# Patient Record
Sex: Male | Born: 1980 | Race: Black or African American | Hispanic: No | Marital: Married | Smoking: Current every day smoker
Health system: Southern US, Community
[De-identification: ages and names within clinical notes are randomized; demographics above are authoritative.]

## PROBLEM LIST (undated history)

## (undated) DIAGNOSIS — J45909 Unspecified asthma, uncomplicated: Secondary | ICD-10-CM

## (undated) HISTORY — PX: FRACTURE SURGERY: SHX138

---

## 2002-02-27 ENCOUNTER — Encounter: Payer: Self-pay | Admitting: Internal Medicine

## 2002-02-27 ENCOUNTER — Emergency Department (HOSPITAL_COMMUNITY): Admission: EM | Admit: 2002-02-27 | Discharge: 2002-02-27 | Payer: Self-pay | Admitting: Internal Medicine

## 2002-12-05 ENCOUNTER — Emergency Department (HOSPITAL_COMMUNITY): Admission: EM | Admit: 2002-12-05 | Discharge: 2002-12-05 | Payer: Self-pay | Admitting: *Deleted

## 2005-03-26 ENCOUNTER — Emergency Department (HOSPITAL_COMMUNITY): Admission: EM | Admit: 2005-03-26 | Discharge: 2005-03-26 | Payer: Self-pay | Admitting: Emergency Medicine

## 2005-10-09 ENCOUNTER — Emergency Department (HOSPITAL_COMMUNITY): Admission: EM | Admit: 2005-10-09 | Discharge: 2005-10-09 | Payer: Self-pay | Admitting: Emergency Medicine

## 2005-10-18 ENCOUNTER — Emergency Department (HOSPITAL_COMMUNITY): Admission: EM | Admit: 2005-10-18 | Discharge: 2005-10-18 | Payer: Self-pay | Admitting: Emergency Medicine

## 2006-01-29 ENCOUNTER — Emergency Department (HOSPITAL_COMMUNITY): Admission: EM | Admit: 2006-01-29 | Discharge: 2006-01-29 | Payer: Self-pay | Admitting: Emergency Medicine

## 2007-03-07 ENCOUNTER — Emergency Department (HOSPITAL_COMMUNITY): Admission: EM | Admit: 2007-03-07 | Discharge: 2007-03-07 | Payer: Self-pay | Admitting: Emergency Medicine

## 2008-02-11 ENCOUNTER — Emergency Department (HOSPITAL_COMMUNITY): Admission: EM | Admit: 2008-02-11 | Discharge: 2008-02-11 | Payer: Self-pay | Admitting: Emergency Medicine

## 2008-02-17 ENCOUNTER — Emergency Department (HOSPITAL_COMMUNITY): Admission: EM | Admit: 2008-02-17 | Discharge: 2008-02-17 | Payer: Self-pay | Admitting: Emergency Medicine

## 2008-03-12 ENCOUNTER — Emergency Department (HOSPITAL_COMMUNITY): Admission: EM | Admit: 2008-03-12 | Discharge: 2008-03-12 | Payer: Self-pay | Admitting: Emergency Medicine

## 2008-06-03 ENCOUNTER — Emergency Department (HOSPITAL_COMMUNITY): Admission: EM | Admit: 2008-06-03 | Discharge: 2008-06-03 | Payer: Self-pay | Admitting: Emergency Medicine

## 2008-12-06 ENCOUNTER — Emergency Department (HOSPITAL_COMMUNITY): Admission: EM | Admit: 2008-12-06 | Discharge: 2008-12-06 | Payer: Self-pay | Admitting: Emergency Medicine

## 2009-07-13 ENCOUNTER — Emergency Department (HOSPITAL_COMMUNITY): Admission: EM | Admit: 2009-07-13 | Discharge: 2009-07-13 | Payer: Self-pay | Admitting: Emergency Medicine

## 2009-07-19 ENCOUNTER — Emergency Department (HOSPITAL_COMMUNITY): Admission: EM | Admit: 2009-07-19 | Discharge: 2009-07-19 | Payer: Self-pay | Admitting: Emergency Medicine

## 2009-09-30 ENCOUNTER — Emergency Department (HOSPITAL_COMMUNITY): Admission: EM | Admit: 2009-09-30 | Discharge: 2009-09-30 | Payer: Self-pay | Admitting: Emergency Medicine

## 2009-10-01 ENCOUNTER — Emergency Department (HOSPITAL_COMMUNITY): Admission: EM | Admit: 2009-10-01 | Discharge: 2009-10-01 | Payer: Self-pay | Admitting: Emergency Medicine

## 2009-10-07 ENCOUNTER — Emergency Department (HOSPITAL_COMMUNITY): Admission: EM | Admit: 2009-10-07 | Discharge: 2009-10-07 | Payer: Self-pay | Admitting: Emergency Medicine

## 2010-04-18 ENCOUNTER — Emergency Department (HOSPITAL_COMMUNITY): Admission: EM | Admit: 2010-04-18 | Discharge: 2010-04-18 | Payer: Self-pay | Admitting: Emergency Medicine

## 2011-01-27 ENCOUNTER — Emergency Department (HOSPITAL_COMMUNITY)
Admission: EM | Admit: 2011-01-27 | Discharge: 2011-01-28 | Disposition: A | Discharge status: Home / Self Care | Payer: Self-pay | Attending: Emergency Medicine | Admitting: Emergency Medicine

## 2011-01-27 DIAGNOSIS — J45909 Unspecified asthma, uncomplicated: Secondary | ICD-10-CM | POA: Insufficient documentation

## 2011-01-27 DIAGNOSIS — R000 Tachycardia, unspecified: Secondary | ICD-10-CM | POA: Insufficient documentation

## 2011-01-27 DIAGNOSIS — R112 Nausea with vomiting, unspecified: Secondary | ICD-10-CM | POA: Insufficient documentation

## 2011-01-27 DIAGNOSIS — R197 Diarrhea, unspecified: Secondary | ICD-10-CM | POA: Insufficient documentation

## 2011-01-27 DIAGNOSIS — E860 Dehydration: Secondary | ICD-10-CM | POA: Insufficient documentation

## 2011-01-27 LAB — BASIC METABOLIC PANEL
BUN: 8 mg/dL (ref 6–23)
CO2: 25 mEq/L (ref 19–32)
Calcium: 9.4 mg/dL (ref 8.4–10.5)
Chloride: 103 mEq/L (ref 96–112)
Creatinine, Ser: 1.09 mg/dL (ref 0.4–1.5)
GFR calc Af Amer: >60 mL/min (ref >60)
GFR calc non Af Amer: >60 mL/min (ref >60)
Glucose, Bld: 130 mg/dL — ABNORMAL HIGH (ref 70–99)
Potassium: 3.4 mEq/L — ABNORMAL LOW (ref 3.5–5.1)
Sodium: 138 mEq/L (ref 135–145)

## 2011-04-01 LAB — ROCKY MTN SPOTTED FVR AB, IGG-BLOOD: RMSF IgG: 0.07 IV

## 2011-04-01 LAB — ROCKY MTN SPOTTED FVR AB, IGM-BLOOD: RMSF IgM: 0.13 IV (ref 0.00–0.89)

## 2011-05-09 ENCOUNTER — Emergency Department (HOSPITAL_COMMUNITY)
Admission: EM | Admit: 2011-05-09 | Discharge: 2011-05-09 | Disposition: A | Discharge status: Home / Self Care | Payer: Self-pay | Attending: Emergency Medicine | Admitting: Emergency Medicine

## 2011-05-09 DIAGNOSIS — L03211 Cellulitis of face: Secondary | ICD-10-CM | POA: Insufficient documentation

## 2011-05-09 DIAGNOSIS — L0201 Cutaneous abscess of face: Secondary | ICD-10-CM | POA: Insufficient documentation

## 2011-05-14 LAB — CULTURE, ROUTINE-ABSCESS: Gram Stain: NONE SEEN

## 2013-06-09 ENCOUNTER — Emergency Department (HOSPITAL_COMMUNITY)
Admission: EM | Admit: 2013-06-09 | Discharge: 2013-06-09 | Disposition: A | Discharge status: Home / Self Care | Payer: Medicaid Other | Attending: Emergency Medicine | Admitting: Emergency Medicine

## 2013-06-09 ENCOUNTER — Encounter (HOSPITAL_COMMUNITY): Payer: Self-pay | Admitting: Emergency Medicine

## 2013-06-09 DIAGNOSIS — Y929 Unspecified place or not applicable: Secondary | ICD-10-CM | POA: Insufficient documentation

## 2013-06-09 DIAGNOSIS — IMO0001 Reserved for inherently not codable concepts without codable children: Secondary | ICD-10-CM | POA: Insufficient documentation

## 2013-06-09 DIAGNOSIS — S50862A Insect bite (nonvenomous) of left forearm, initial encounter: Secondary | ICD-10-CM

## 2013-06-09 DIAGNOSIS — Y939 Activity, unspecified: Secondary | ICD-10-CM | POA: Insufficient documentation

## 2013-06-09 DIAGNOSIS — F17200 Nicotine dependence, unspecified, uncomplicated: Secondary | ICD-10-CM | POA: Insufficient documentation

## 2013-06-09 MED ORDER — DOXYCYCLINE HYCLATE 100 MG PO CAPS: 100.0000 mg | ORAL_CAPSULE | Freq: Two times a day (BID) | ORAL | Status: AC

## 2013-06-09 MED ORDER — DOXYCYCLINE HYCLATE 100 MG PO TABS
100.0000 mg | ORAL_TABLET | Freq: Once | ORAL | Status: AC
  Administered 2013-06-09: 100 mg via ORAL
  Filled 2013-06-09: qty 1

## 2013-06-09 MED ORDER — DEXAMETHASONE SODIUM PHOSPHATE 4 MG/ML IJ SOLN
8.0000 mg | Freq: Once | INTRAMUSCULAR | Status: AC
  Administered 2013-06-09: 8 mg via INTRAMUSCULAR
  Filled 2013-06-09: qty 2

## 2013-06-09 NOTE — ED Notes (Signed)
Pt c/o abscess to left arm since yesterday.

## 2013-06-09 NOTE — ED Provider Notes (Signed)
History     CSN: [id number]  Arrival date & time 06/09/13  7829   First MD Initiated Contact with Patient 06/09/13 [phone]      Chief Complaint  Patient presents with  . Abscess    (Consider location/radiation/quality/duration/timing/severity/associated sxs/prior treatment) Patient is a 32 y.o. male presenting with abscess. The history is provided by the patient.  Abscess Location:  Shoulder/arm Shoulder/arm abscess location:  L forearm Abscess quality: itching and redness   Abscess quality: not draining   Red streaking: no   Duration:  1 day Progression:  Worsening Chronicity:  New Context: not diabetes and not immunosuppression   Relieved by:  Nothing Worsened by:  Nothing tried Ineffective treatments:  None tried Associated symptoms: no fatigue and no fever     History reviewed. No pertinent past medical history.  History reviewed. No pertinent past surgical history.  No family history on file.  History  Substance Use Topics  . Smoking status: Current Every Day Smoker -- 1.00 packs/day  . Smokeless tobacco: Not on file  . Alcohol Use: No      Review of Systems  Constitutional: Negative for fever, activity change and fatigue.       All ROS Neg except as noted in HPI  HENT: Negative for nosebleeds and neck pain.   Eyes: Negative for photophobia and discharge.  Respiratory: Negative for cough, shortness of breath and wheezing.   Cardiovascular: Negative for chest pain and palpitations.  Gastrointestinal: Negative for abdominal pain and blood in stool.  Genitourinary: Negative for dysuria, frequency and hematuria.  Musculoskeletal: Negative for back pain and arthralgias.  Skin: Negative.   Neurological: Negative for dizziness, seizures and speech difficulty.  Psychiatric/Behavioral: Negative for hallucinations and confusion.    Allergies  Review of patient's allergies indicates no known allergies.  Home Medications  No current outpatient prescriptions on  file.  BP 149/94  Pulse 88  Temp(Src) 98.8 F (37.1 C)  Resp 18  Ht 5\' 7"  (1.702 m)  Wt 190 lb (86.183 kg)  BMI 29.75 kg/m2  SpO2 99%  Physical Exam  Nursing note and vitals reviewed. Constitutional: He is oriented to person, place, and time. He appears well-developed and well-nourished.  Non-toxic appearance.  HENT:  Head: Normocephalic.  Right Ear: Tympanic membrane and external ear normal.  Left Ear: Tympanic membrane and external ear normal.  Eyes: EOM and lids are normal. Pupils are equal, round, and reactive to light.  Neck: Normal range of motion. Neck supple. Carotid bruit is not present.  Cardiovascular: Normal rate, regular rhythm, normal heart sounds, intact distal pulses and normal pulses.   Pulmonary/Chest: Breath sounds normal. No respiratory distress.  Abdominal: Soft. Bowel sounds are normal. There is no tenderness. There is no guarding.  Musculoskeletal: Normal range of motion.       Left forearm: He exhibits tenderness and swelling.  Increase redness with some tenderness of the ulnar aspect of the left forearm. No red streaks. The area is not hot.  Lymphadenopathy:       Head (right side): No submandibular adenopathy present.       Head (left side): No submandibular adenopathy present.    He has no cervical adenopathy.  Neurological: He is alert and oriented to person, place, and time. He has normal strength. No cranial nerve deficit or sensory deficit.  Skin: Skin is warm and dry.  Psychiatric: He has a normal mood and affect. His speech is normal.    ED Course  Procedures (including critical care  time)  Labs Reviewed - No data to display No results found.   No diagnosis found.    MDM  I have reviewed nursing notes, vital signs, and all appropriate lab and imaging results for this patient. Pt sustained an insect bite to the left forearm. No fever, no red streaks. No visible abscess. Plan- Pt treated in the ED with doxycycline and decadron. Rx for  doxycycline given. Pt to return if symptoms worsen.       Kathie Dike, PA-C 06/09/13 1015

## 2013-06-09 NOTE — ED Notes (Signed)
States felt like he was stung by insect yesterday, but couldn't locate exact bite site.  "Rubbed tobacco on it", per patient.  Swelling began yesterday, worsened this AM

## 2013-06-10 NOTE — ED Provider Notes (Signed)
Medical screening examination/treatment/procedure(s) were performed by non-physician practitioner and as supervising physician I was immediately available for consultation/collaboration.  Tehillah Cipriani, MD 06/10/13 1344 

## 2013-09-02 ENCOUNTER — Emergency Department (HOSPITAL_COMMUNITY)
Admission: EM | Admit: 2013-09-02 | Discharge: 2013-09-02 | Disposition: A | Discharge status: Home / Self Care | Payer: Medicaid Other | Attending: Emergency Medicine | Admitting: Emergency Medicine

## 2013-09-02 ENCOUNTER — Encounter (HOSPITAL_COMMUNITY): Payer: Self-pay | Admitting: *Deleted

## 2013-09-02 DIAGNOSIS — R05 Cough: Secondary | ICD-10-CM | POA: Insufficient documentation

## 2013-09-02 DIAGNOSIS — R112 Nausea with vomiting, unspecified: Secondary | ICD-10-CM | POA: Insufficient documentation

## 2013-09-02 DIAGNOSIS — R1110 Vomiting, unspecified: Secondary | ICD-10-CM

## 2013-09-02 DIAGNOSIS — F17200 Nicotine dependence, unspecified, uncomplicated: Secondary | ICD-10-CM | POA: Insufficient documentation

## 2013-09-02 DIAGNOSIS — R197 Diarrhea, unspecified: Secondary | ICD-10-CM | POA: Insufficient documentation

## 2013-09-02 DIAGNOSIS — H579 Unspecified disorder of eye and adnexa: Secondary | ICD-10-CM | POA: Insufficient documentation

## 2013-09-02 DIAGNOSIS — R059 Cough, unspecified: Secondary | ICD-10-CM | POA: Insufficient documentation

## 2013-09-02 LAB — CBC WITH DIFFERENTIAL/PLATELET
Basophils Absolute: 0 10*3/uL (ref 0.0–0.1)
Basophils Relative: 0 % (ref 0–1)
Eosinophils Absolute: 0.5 10*3/uL (ref 0.0–0.7)
Eosinophils Relative: 4 % (ref 0–5)
HCT: 41.1 % (ref 39.0–52.0)
Hemoglobin: 14.3 g/dL (ref 13.0–17.0)
Lymphocytes Relative: 40 % (ref 12–46)
Lymphs Abs: 4.4 10*3/uL — ABNORMAL HIGH (ref 0.7–4.0)
MCH: 31.8 pg (ref 26.0–34.0)
MCHC: 34.8 g/dL (ref 30.0–36.0)
MCV: 91.3 fL (ref 78.0–100.0)
Monocytes Absolute: 0.9 10*3/uL (ref 0.1–1.0)
Monocytes Relative: 8 % (ref 3–12)
Neutro Abs: 5.4 10*3/uL (ref 1.7–7.7)
Neutrophils Relative %: 48 % (ref 43–77)
Platelets: 191 10*3/uL (ref 150–400)
RBC: 4.5 MIL/uL (ref 4.22–5.81)
RDW: 14.4 % (ref 11.5–15.5)
WBC: 11.1 10*3/uL — ABNORMAL HIGH (ref 4.0–10.5)

## 2013-09-02 LAB — COMPREHENSIVE METABOLIC PANEL
ALT: 15 U/L (ref 0–53)
AST: 16 U/L (ref 0–37)
Albumin: 4 g/dL (ref 3.5–5.2)
Alkaline Phosphatase: 59 U/L (ref 39–117)
BUN: 11 mg/dL (ref 6–23)
CO2: 27 mEq/L (ref 19–32)
Calcium: 10.2 mg/dL (ref 8.4–10.5)
Chloride: 104 mEq/L (ref 96–112)
Creatinine, Ser: 0.89 mg/dL (ref 0.50–1.35)
GFR calc Af Amer: >90 mL/min (ref >90)
GFR calc non Af Amer: >90 mL/min (ref >90)
Glucose, Bld: 98 mg/dL (ref 70–99)
Potassium: 4 mEq/L (ref 3.5–5.1)
Sodium: 142 mEq/L (ref 135–145)
Total Bilirubin: 0.2 mg/dL — ABNORMAL LOW (ref 0.3–1.2)
Total Protein: 7.4 g/dL (ref 6.0–8.3)

## 2013-09-02 LAB — LIPASE, BLOOD: Lipase: 99 U/L — ABNORMAL HIGH (ref 11–59)

## 2013-09-02 MED ORDER — SODIUM CHLORIDE 0.9 % IV BOLUS (SEPSIS)
1000.0000 mL | Freq: Once | INTRAVENOUS | Status: AC
  Administered 2013-09-02: 1000 mL via INTRAVENOUS

## 2013-09-02 MED ORDER — ONDANSETRON HCL 4 MG/2ML IJ SOLN
4.0000 mg | Freq: Once | INTRAMUSCULAR | Status: AC
  Administered 2013-09-02: 4 mg via INTRAVENOUS
  Filled 2013-09-02: qty 2

## 2013-09-02 MED ORDER — PROMETHAZINE HCL 25 MG PO TABS: 25.0000 mg | ORAL_TABLET | Freq: Four times a day (QID) | ORAL | Status: DC | PRN

## 2013-09-02 MED ORDER — DIPHENOXYLATE-ATROPINE 2.5-0.025 MG PO TABS: 1.0000 | ORAL_TABLET | Freq: Four times a day (QID) | ORAL | Status: DC | PRN

## 2013-09-02 NOTE — ED Notes (Signed)
Patient states that he feels better at this time.

## 2013-09-02 NOTE — ED Provider Notes (Signed)
CSN: [id number]     Arrival date & time 09/02/13  2030 History  This chart was scribed for Alejandro Crease, MD by Greggory Stallion, ED Scribe. This patient was seen in room [facility] and the patient's care was started at 9:4 PM.   Chief Complaint  Patient presents with  . Nausea  . Emesis  . Diarrhea  . Eye Problem   The history is provided by the patient. No language interpreter was used.    HPI Comments: Alejandro Lopez is a 32 y.o. male who presents to the Emergency Department complaining of nausea, emesis and diarrhea that started one day ago. Pt states he is also having body aches and a mild cough. His appetite has gone down due to the emesis. Pt denies dysuria, urinary retention, abdominal pain, sore throat and fever.   History reviewed. No pertinent past medical history. History reviewed. No pertinent past surgical history. History reviewed. No pertinent family history. History  Substance Use Topics  . Smoking status: Current Every Day Smoker -- 1.00 packs/day  . Smokeless tobacco: Not on file  . Alcohol Use: No    Review of Systems  Constitutional: Positive for appetite change. Negative for fever.  HENT: Negative for sore throat.   Respiratory: Positive for cough.   Gastrointestinal: Positive for nausea, vomiting and diarrhea. Negative for abdominal pain.  Genitourinary: Negative for dysuria.  All other systems reviewed and are negative.    Allergies  Review of patient's allergies indicates no known allergies.  Home Medications  No current outpatient prescriptions on file.  BP 126/76  Pulse 100  Temp(Src) 98.7 F (37.1 C) (Oral)  Resp 20  Ht 5\' 10"  (1.778 m)  Wt 175 lb (79.379 kg)  BMI 25.11 kg/m2  SpO2 99%  Physical Exam  Nursing note and vitals reviewed. Constitutional: He is oriented to person, place, and time. He appears well-developed and well-nourished. No distress.  HENT:  Head: Normocephalic and atraumatic.  Right Ear: Hearing normal.   Left Ear: Hearing normal.  Nose: Nose normal.  Mouth/Throat: Oropharynx is clear and moist and mucous membranes are normal.  Eyes: Conjunctivae and EOM are normal. Pupils are equal, round, and reactive to light.  Neck: Normal range of motion. Neck supple.  Cardiovascular: Regular rhythm, S1 normal and S2 normal.  Exam reveals no gallop and no friction rub.   No murmur heard. Pulmonary/Chest: Effort normal and breath sounds normal. No respiratory distress. He exhibits no tenderness.  Abdominal: Soft. Normal appearance and bowel sounds are normal. There is no hepatosplenomegaly. There is no tenderness. There is no rebound, no guarding, no tenderness at McBurney's point and negative Murphy's sign. No hernia.  Musculoskeletal: Normal range of motion.  Neurological: He is alert and oriented to person, place, and time. He has normal strength. No cranial nerve deficit or sensory deficit. Coordination normal. GCS eye subscore is 4. GCS verbal subscore is 5. GCS motor subscore is 6.  Skin: Skin is warm, dry and intact. No rash noted. No cyanosis.  Psychiatric: He has a normal mood and affect. His speech is normal and behavior is normal. Thought content normal.    ED Course  Procedures (including critical care time)  DIAGNOSTIC STUDIES: Oxygen Saturation is 99% on RA, normal by my interpretation.    COORDINATION OF CARE: 9:47 PM-Discussed treatment plan which includes IV fluids with pt at bedside and pt agreed to plan.   Labs Review Labs Reviewed  CBC WITH DIFFERENTIAL - Abnormal; Notable for the following:  WBC 11.1 (*)    Lymphs Abs 4.4 (*)    All other components within normal limits  COMPREHENSIVE METABOLIC PANEL - Abnormal; Notable for the following:    Total Bilirubin 0.2 (*)    All other components within normal limits  LIPASE, BLOOD - Abnormal; Notable for the following:    Lipase 99 (*)    All other components within normal limits  URINALYSIS, ROUTINE W REFLEX MICROSCOPIC    Imaging Review No results found.  MDM  Diagnosis: Nausea, vomiting, diarrhea  Patient presents to the ER for evaluation of vomiting and diarrhea for one day. Patient has not been able to hold anything down. He reports that if he eats or drinks he vomits it back up her house watery diarrhea. He is not experiencing any significant abdominal pain and his abdominal exam was benign and nontender. Blood work revealed a slightly elevated lipase, unclear etiology. I doubt overt pancreatitis and the patient. Patient was hydrated and administered Zofran. He is appropriate for outpatient treatment with continued oral hydration, antiemetics and antidiarrheals.  I personally performed the services described in this documentation, which was scribed in my presence. The recorded information has been reviewed and is accurate.   Alejandro Crease, MD 09/02/13 2312

## 2013-09-02 NOTE — ED Notes (Signed)
Pt c/o n/v/d x 1 day. Pt also reports seeing purple dots.

## 2014-06-17 ENCOUNTER — Emergency Department (HOSPITAL_COMMUNITY): Payer: Worker's Compensation

## 2014-06-17 ENCOUNTER — Emergency Department (HOSPITAL_COMMUNITY)
Admission: EM | Admit: 2014-06-17 | Discharge: 2014-06-17 | Disposition: A | Discharge status: Home / Self Care | Payer: Worker's Compensation | Attending: Emergency Medicine | Admitting: Emergency Medicine

## 2014-06-17 ENCOUNTER — Encounter (HOSPITAL_COMMUNITY): Payer: Self-pay | Admitting: Emergency Medicine

## 2014-06-17 DIAGNOSIS — X58XXXA Exposure to other specified factors, initial encounter: Secondary | ICD-10-CM | POA: Insufficient documentation

## 2014-06-17 DIAGNOSIS — F17200 Nicotine dependence, unspecified, uncomplicated: Secondary | ICD-10-CM | POA: Insufficient documentation

## 2014-06-17 DIAGNOSIS — S335XXA Sprain of ligaments of lumbar spine, initial encounter: Secondary | ICD-10-CM | POA: Insufficient documentation

## 2014-06-17 DIAGNOSIS — R109 Unspecified abdominal pain: Secondary | ICD-10-CM | POA: Insufficient documentation

## 2014-06-17 DIAGNOSIS — N509 Disorder of male genital organs, unspecified: Secondary | ICD-10-CM | POA: Insufficient documentation

## 2014-06-17 DIAGNOSIS — Z79899 Other long term (current) drug therapy: Secondary | ICD-10-CM | POA: Insufficient documentation

## 2014-06-17 DIAGNOSIS — S39012A Strain of muscle, fascia and tendon of lower back, initial encounter: Secondary | ICD-10-CM

## 2014-06-17 DIAGNOSIS — Y939 Activity, unspecified: Secondary | ICD-10-CM | POA: Insufficient documentation

## 2014-06-17 DIAGNOSIS — Y929 Unspecified place or not applicable: Secondary | ICD-10-CM | POA: Insufficient documentation

## 2014-06-17 MED ORDER — TRAMADOL HCL 50 MG PO TABS: 50.0000 mg | ORAL_TABLET | Freq: Four times a day (QID) | ORAL | Status: DC | PRN

## 2014-06-17 MED ORDER — NAPROXEN 500 MG PO TABS: 500.0000 mg | ORAL_TABLET | Freq: Two times a day (BID) | ORAL | Status: DC

## 2014-06-17 MED ORDER — IBUPROFEN 800 MG PO TABS
800.0000 mg | ORAL_TABLET | Freq: Once | ORAL | Status: AC
  Administered 2014-06-17: 800 mg via ORAL
  Filled 2014-06-17: qty 1

## 2014-06-17 MED ORDER — ORPHENADRINE CITRATE ER 100 MG PO TB12: 100.0000 mg | ORAL_TABLET | Freq: Two times a day (BID) | ORAL | Status: DC

## 2014-06-17 NOTE — ED Notes (Signed)
Pt c/o lower back pain and scrotum pain after being an a piece of equipment at work when it fell. Pt denies anything falling on self.

## 2014-06-17 NOTE — ED Notes (Signed)
Pt c/o lower back pain and groin pain. Denies any swelling or decrease in ROM. Nad noted at present.

## 2014-06-17 NOTE — ED Provider Notes (Signed)
CSN: [id number]     Arrival date & time 06/17/14  [id number] History   First MD Initiated Contact with Patient 06/17/14 [phone]     Chief Complaint  Patient presents with  . Back Pain  . Groin Pain     (Consider location/radiation/quality/duration/timing/severity/associated sxs/prior Treatment) Patient is a [age] y.o. male presenting with back pain and groin pain. The history is provided by the patient.  Back Pain Groin Pain  He was in a piece of equipment that rolled onto its side. He is complaining of pain in his lower back and also in scrotal area. Pain was initially 8/10, but has decreased to 6/10. He denies any weakness, numbness. He denies any difficulty with urination. He denies any other injury.  History reviewed. No pertinent past medical history. History reviewed. No pertinent past surgical history. No family history on file. History  Substance Use Topics  . Smoking status: Current Every Day Smoker -- 1.00 packs/day  . Smokeless tobacco: Not on file  . Alcohol Use: No    Review of Systems  Musculoskeletal: Positive for back pain.  All other systems reviewed and are negative.     Allergies  Review of patient's allergies indicates no known allergies.  Home Medications   Prior to Admission medications   Medication Sig Start Date End Date Taking? Authorizing Provider  diphenoxylate-atropine (LOMOTIL) 2.5-0.025 MG per tablet Take 1 tablet by mouth 4 (four) times daily as needed for diarrhea or loose stools. 09/02/13   Gilda Creasehristopher J. Pollina, MD  promethazine (PHENERGAN) 25 MG tablet Take 1 tablet (25 mg total) by mouth every 6 (six) hours as needed for nausea. 09/02/13   Gilda Creasehristopher J. Pollina, MD   BP 164/98  Pulse 96  Temp(Src) 98 F (36.7 C) (Oral)  Resp 20  Ht 5\' 11"  (1.803 m)  Wt 165 lb (74.844 kg)  BMI 23.02 kg/m2  SpO2 100% Physical Exam  Nursing note and vitals reviewed.  33 year old male, resting comfortably and in no acute distress. Vital signs are significant  for hypertension with blood pressure 164/98. Oxygen saturation is 100%, which is normal. Head is normocephalic and atraumatic. PERRLA, EOMI. Oropharynx is clear. Neck is nontender and supple without adenopathy or JVD. Back is moderately tender throughout the lumbar spine. There is mild/moderate bilateral paralumbar spasm. There is no CVA tenderness. Lungs are clear without rales, wheezes, or rhonchi. Chest is nontender. Heart has regular rate and rhythm without murmur. Abdomen is soft, flat, nontender without masses or hepatosplenomegaly and peristalsis is normoactive. Genitalia: Circumcised penis, testes descended without masses or tenderness. Extremities have no cyanosis or edema, full range of motion is present. Skin is warm and dry without rash. Neurologic: Mental status is normal, cranial nerves are intact, there are no motor or sensory deficits.  ED Course  Procedures (including critical care time) Imaging Review Dg Lumbar Spine Complete  06/17/2014   CLINICAL DATA:  Neck pain  EXAM: LUMBAR SPINE - COMPLETE 4+ VIEW  COMPARISON:  04/18/2010  FINDINGS: There is no evidence of lumbar spine fracture. Alignment is normal. Intervertebral disc spaces are maintained.  IMPRESSION: Negative.   Electronically Signed   By: Christiana PellantGretchen  Green M.D.   On: 06/17/2014 07:49   MDM   Final diagnoses:  Lumbar strain, initial encounter    Lumbar strain. Scrotal pain is probably referred pain from lumbar injury. He'll be sent for lumbar x-rays.  X-rays are unremarkable. He is discharged with prescriptions for naproxen, orphenadrine, and tramadol. Work release is given for  24 hours.  Dione Boozeavid Idamae Coccia, MD 06/17/14 [phone]

## 2014-06-17 NOTE — Discharge Instructions (Signed)
Back Pain, Adult °Low back pain is very common. About 1 in 5 people have back pain. The cause of low back pain is rarely dangerous. The pain often gets better over time. About half of people with a sudden onset of back pain feel better in just 2 weeks. About 8 in 10 people feel better by 6 weeks.  °CAUSES °Some common causes of back pain include: °· Strain of the muscles or ligaments supporting the spine. °· Wear and tear (degeneration) of the spinal discs. °· Arthritis. °· Direct injury to the back. °DIAGNOSIS °Most of the time, the direct cause of low back pain is not known. However, back pain can be treated effectively even when the exact cause of the pain is unknown. Answering your caregiver's questions about your overall health and symptoms is one of the most accurate ways to make sure the cause of your pain is not dangerous. If your caregiver needs more information, he or she may order lab work or imaging tests (X-rays or MRIs). However, even if imaging tests show changes in your back, this usually does not require surgery. °HOME CARE INSTRUCTIONS °For many people, back pain returns. Since low back pain is rarely dangerous, it is often a condition that people can learn to manage on their own.  °· Remain active. It is stressful on the back to sit or stand in one place. Do not sit, drive, or stand in one place for more than 30 minutes at a time. Take short walks on level surfaces as soon as pain allows. Try to increase the length of time you walk each day. °· Do not stay in bed. Resting more than 1 or 2 days can delay your recovery. °· Do not avoid exercise or work. Your body is made to move. It is not dangerous to be active, even though your back may hurt. Your back will likely heal faster if you return to being active before your pain is gone. °· Pay attention to your body when you  bend and lift. Many people have less discomfort when lifting if they bend their knees, keep the load close to their bodies, and  avoid twisting. Often, the most comfortable positions are those that put less stress on your recovering back. °· Find a comfortable position to sleep. Use a firm mattress and lie on your side with your knees slightly bent. If you lie on your back, put a pillow under your knees. °· Only take over-the-counter or prescription medicines as directed by your caregiver. Over-the-counter medicines to reduce pain and inflammation are often the most helpful. Your caregiver may prescribe muscle relaxant drugs. These medicines help dull your pain so you can more quickly return to your normal activities and healthy exercise. °· Put ice on the injured area. °¨ Put ice in a plastic bag. °¨ Place a towel between your skin and the bag. °¨ Leave the ice on for 15-20 minutes, 03-04 times a day for the first 2 to 3 days. After that, ice and heat may be alternated to reduce pain and spasms. °· Ask your caregiver about trying back exercises and gentle massage. This may be of some benefit. °· Avoid feeling anxious or stressed. Stress increases muscle tension and can worsen back pain. It is important to recognize when you are anxious or stressed and learn ways to manage it. Exercise is a great option. °SEEK MEDICAL CARE IF: °· You have pain that is not relieved with rest or medicine. °· You have pain that does not improve in 1 week. °· You have new symptoms. °· You are generally not feeling well. °SEEK   IMMEDIATE MEDICAL CARE IF:   You have pain that radiates from your back into your legs.  You develop new bowel or bladder control problems.  You have unusual weakness or numbness in your arms or legs.  You develop nausea or vomiting.  You develop abdominal pain.  You feel faint. Document Released: 12/10/2005 Document Revised: 06/10/2012 Document Reviewed: 04/30/2011 [facility] Patient Information 2015 [place], [place]. This information is not intended to replace advice given to you by your health care provider. Make sure you  discuss any questions you have with your health care provider.  Naproxen and naproxen sodium oral immediate-release tablets What is this medicine? NAPROXEN (na PROX en) is a non-steroidal anti-inflammatory drug (NSAID). It is used to reduce swelling and to treat pain. This medicine may be used for dental pain, headache, or painful monthly periods. It is also used for painful joint and muscular problems such as arthritis, tendinitis, bursitis, and gout. This medicine may be used for other purposes; ask your health care provider or pharmacist if you have questions. COMMON BRAND NAME(S): Aflaxen, Aleve, Aleve Arthritis, All Day Relief, Anaprox, Anaprox DS, Naprosyn What should I tell my health care provider before I take this medicine? They need to know if you have any of these conditions: -asthma -cigarette smoker -drink more than 3 alcohol containing drinks a day -heart disease or circulation problems such as heart failure or leg edema (fluid retention) -high blood pressure -kidney disease -liver disease -stomach bleeding or ulcers -an unusual or allergic reaction to naproxen, aspirin, other NSAIDs, other medicines, foods, dyes, or preservatives -pregnant or trying to get pregnant -breast-feeding How should I use this medicine? Take this medicine by mouth with a glass of water. Follow the directions on the prescription label. Take it with food if your stomach gets upset. Try to not lie down for at least 10 minutes after you take it. Take your medicine at regular intervals. Do not take your medicine more often than directed. Long-term, continuous use may increase the risk of heart attack or stroke. A special MedGuide will be given to you by the pharmacist with each prescription and refill. Be sure to read this information carefully each time. Talk to your pediatrician regarding the use of this medicine in children. Special care may be needed. Overdosage: If you think you have taken too much of  this medicine contact a poison control center or emergency room at once. NOTE: This medicine is only for you. Do not share this medicine with others. What if I miss a dose? If you miss a dose, take it as soon as you can. If it is almost time for your next dose, take only that dose. Do not take double or extra doses. What may interact with this medicine? -alcohol -aspirin -cidofovir -diuretics -lithium -methotrexate -other drugs for inflammation like ketorolac or prednisone -pemetrexed -probenecid -warfarin This list may not describe all possible interactions. Give your health care provider a list of all the medicines, herbs, non-prescription drugs, or dietary supplements you use. Also tell them if you smoke, drink alcohol, or use illegal drugs. Some items may interact with your medicine. What should I watch for while using this medicine? Tell your doctor or health care professional if your pain does not get better. Talk to your doctor before taking another medicine for pain. Do not treat yourself. This medicine does not prevent heart attack or stroke. In fact, this medicine may increase the chance of a heart attack or stroke. The chance may increase  with longer use of this medicine and in people who have heart disease. If you take aspirin to prevent heart attack or stroke, talk with your doctor or health care professional. Do not take other medicines that contain aspirin, ibuprofen, or naproxen with this medicine. Side effects such as stomach upset, nausea, or ulcers may be more likely to occur. Many medicines available without a prescription should not be taken with this medicine. This medicine can cause ulcers and bleeding in the stomach and intestines at any time during treatment. Do not smoke cigarettes or drink alcohol. These increase irritation to your stomach and can make it more susceptible to damage from this medicine. Ulcers and bleeding can happen without warning symptoms and can cause  death. You may get drowsy or dizzy. Do not drive, use machinery, or do anything that needs mental alertness until you know how this medicine affects you. Do not stand or sit up quickly, especially if you are an older patient. This reduces the risk of dizzy or fainting spells. This medicine can cause you to bleed more easily. Try to avoid damage to your teeth and gums when you brush or floss your teeth. What side effects may I notice from receiving this medicine? Side effects that you should report to your doctor or health care professional as soon as possible: -black or bloody stools, blood in the urine or vomit -blurred vision -chest pain -difficulty breathing or wheezing -nausea or vomiting -severe stomach pain -skin rash, skin redness, blistering or peeling skin, hives, or itching -slurred speech or weakness on one side of the body -swelling of eyelids, throat, lips -unexplained weight gain or swelling -unusually weak or tired -yellowing of eyes or skin Side effects that usually do not require medical attention (report to your doctor or health care professional if they continue or are bothersome): -constipation -headache -heartburn This list may not describe all possible side effects. Call your doctor for medical advice about side effects. You may report side effects to FDA at 1-800-FDA-1088. Where should I keep my medicine? Keep out of the reach of children. Store at room temperature between 15 and 30 degrees C (59 and 86 degrees F). Keep container tightly closed. Throw away any unused medicine after the expiration date. NOTE: This sheet is a summary. It may not cover all possible information. If you have questions about this medicine, talk to your doctor, pharmacist, or health care provider.  2015, Elsevier/Gold Standard. (2009-12-12 20:10:16)  Orphenadrine tablets What is this medicine? ORPHENADRINE (or FEN a dreen) helps to relieve pain and stiffness in muscles and can treat  muscle spasms. This medicine may be used for other purposes; ask your health care provider or pharmacist if you have questions. COMMON BRAND NAME(S): Norflex What should I tell my health care provider before I take this medicine? They need to know if you have any of these conditions: -glaucoma -heart disease -kidney disease -myasthenia gravis -peptic ulcer disease -prostate disease -stomach problems -an unusual or allergic reaction to orphenadrine, other medicines, foods, lactose, dyes, or preservatives -pregnant or trying to get pregnant -breast-feeding How should I use this medicine? Take this medicine by mouth with a full glass of water. Follow the directions on the prescription label. Take your medicine at regular intervals. Do not take your medicine more often than directed. Do not take more than you are told to take. Talk to your pediatrician regarding the use of this medicine in children. Special care may be needed. Patients over 61 years old  may have a stronger reaction and need a smaller dose. Overdosage: If you think you have taken too much of this medicine contact a poison control center or emergency room at once. NOTE: This medicine is only for you. Do not share this medicine with others. What if I miss a dose? If you miss a dose, take it as soon as you can. If it is almost time for your next dose, take only that dose. Do not take double or extra doses. What may interact with this medicine? -alcohol -antihistamines -barbiturates, like phenobarbital -benzodiazepines -cyclobenzaprine -medicines for pain -phenothiazines like chlorpromazine, mesoridazine, prochlorperazine, thioridazine This list may not describe all possible interactions. Give your health care provider a list of all the medicines, herbs, non-prescription drugs, or dietary supplements you use. Also tell them if you smoke, drink alcohol, or use illegal drugs. Some items may interact with your medicine. What  should I watch for while using this medicine? Your mouth may get dry. Chewing sugarless gum or sucking hard candy, and drinking plenty of water may help. Contact your doctor if the problem does not go away or is severe. This medicine may cause dry eyes and blurred vision. If you wear contact lenses you may feel some discomfort. Lubricating drops may help. See your eye doctor if the problem does not go away or is severe. You may get drowsy or dizzy. Do not drive, use machinery, or do anything that needs mental alertness until you know how this medicine affects you. Do not stand or sit up quickly, especially if you are an older patient. This reduces the risk of dizzy or fainting spells. Alcohol may interfere with the effect of this medicine. Avoid alcoholic drinks. What side effects may I notice from receiving this medicine? Side effects that you should report to your doctor or health care professional as soon as possible: -allergic reactions like skin rash, itching or hives, swelling of the face, lips, or tongue -changes in vision -difficulty breathing -fast heartbeat or palpitations -hallucinations -light headedness, fainting spells -vomiting Side effects that usually do not require medical attention (report to your doctor or health care professional if they continue or are bothersome): -dizziness -drowsiness -headache -nausea This list may not describe all possible side effects. Call your doctor for medical advice about side effects. You may report side effects to FDA at 1-800-FDA-1088. Where should I keep my medicine? Keep out of the reach of children. Store at room temperature between 15 and 30 degrees C (59 and 86 degrees F). Protect from light. Keep container tightly closed. Throw away any unused medicine after the expiration date. NOTE: This sheet is a summary. It may not cover all possible information. If you have questions about this medicine, talk to your doctor, pharmacist, or health  care provider.  2015, [employer]vier/Gold Standard. (2008-07-06 17:19:12)  Tramadol tablets What is this medicine? TRAMADOL (TRA ma dole) is a pain reliever. It is used to treat moderate to severe pain in adults. This medicine may be used for other purposes; ask your health care provider or pharmacist if you have questions. COMMON BRAND NAME(S): Ultram What should I tell my health care provider before I take this medicine? They need to know if you have any of these conditions: -brain tumor -depression -drug abuse or addiction -head injury -if you frequently drink alcohol containing drinks -kidney disease or trouble passing urine -liver disease -lung disease, asthma, or breathing problems -seizures or epilepsy -suicidal thoughts, plans, or attempt; a previous suicide attempt by you  or a family member -an unusual or allergic reaction to tramadol, codeine, other medicines, foods, dyes, or preservatives -pregnant or trying to get pregnant -breast-feeding How should I use this medicine? Take this medicine by mouth with a full glass of water. Follow the directions on the prescription label. If the medicine upsets your stomach, take it with food or milk. Do not take more medicine than you are told to take. Talk to your pediatrician regarding the use of this medicine in children. Special care may be needed. Overdosage: If you think you have taken too much of this medicine contact a poison control center or emergency room at once. NOTE: This medicine is only for you. Do not share this medicine with others. What if I miss a dose? If you miss a dose, take it as soon as you can. If it is almost time for your next dose, take only that dose. Do not take double or extra doses. What may interact with this medicine? Do not take this medicine with any of the following medications: -MAOIs like Carbex, Eldepryl, Marplan, Nardil, and Parnate This medicine may also interact with the following  medications: -alcohol or medicines that contain alcohol -antihistamines -benzodiazepines -bupropion -carbamazepine or oxcarbazepine -clozapine -cyclobenzaprine -digoxin -furazolidone -linezolid -medicines for depression, anxiety, or psychotic disturbances -medicines for migraine headache like almotriptan, eletriptan, frovatriptan, naratriptan, rizatriptan, sumatriptan, zolmitriptan -medicines for pain like pentazocine, buprenorphine, butorphanol, meperidine, nalbuphine, and propoxyphene -medicines for sleep -muscle relaxants -naltrexone -phenobarbital -phenothiazines like perphenazine, thioridazine, chlorpromazine, mesoridazine, fluphenazine, prochlorperazine, promazine, and trifluoperazine -procarbazine -warfarin This list may not describe all possible interactions. Give your health care provider a list of all the medicines, herbs, non-prescription drugs, or dietary supplements you use. Also tell them if you smoke, drink alcohol, or use illegal drugs. Some items may interact with your medicine. What should I watch for while using this medicine? Tell your doctor or health care professional if your pain does not go away, if it gets worse, or if you have new or a different type of pain. You may develop tolerance to the medicine. Tolerance means that you will need a higher dose of the medicine for pain relief. Tolerance is normal and is expected if you take this medicine for a long time. Do not suddenly stop taking your medicine because you may develop a severe reaction. Your body becomes used to the medicine. This does NOT mean you are addicted. Addiction is a behavior related to getting and using a drug for a non-medical reason. If you have pain, you have a medical reason to take pain medicine. Your doctor will tell you how much medicine to take. If your doctor wants you to stop the medicine, the dose will be slowly lowered over time to avoid any side effects. You may get drowsy or dizzy. Do  not drive, use machinery, or do anything that needs mental alertness until you know how this medicine affects you. Do not stand or sit up quickly, especially if you are an older patient. This reduces the risk of dizzy or fainting spells. Alcohol can increase or decrease the effects of this medicine. Avoid alcoholic drinks. You may have constipation. Try to have a bowel movement at least every 2 to 3 days. If you do not have a bowel movement for 3 days, call your doctor or health care professional. Your mouth may get dry. Chewing sugarless gum or sucking hard candy, and drinking plenty of water may help. Contact your doctor if the problem does not go  away or is severe. What side effects may I notice from receiving this medicine? Side effects that you should report to your doctor or health care professional as soon as possible: -allergic reactions like skin rash, itching or hives, swelling of the face, lips, or tongue -breathing difficulties, wheezing -confusion -itching -light headedness or fainting spells -redness, blistering, peeling or loosening of the skin, including inside the mouth -seizures Side effects that usually do not require medical attention (report to your doctor or health care professional if they continue or are bothersome): -constipation -dizziness -drowsiness -headache -nausea, vomiting This list may not describe all possible side effects. Call your doctor for medical advice about side effects. You may report side effects to FDA at 1-800-FDA-1088. Where should I keep my medicine? Keep out of the reach of children. Store at room temperature between 15 and 30 degrees C (59 and 86 degrees F). Keep container tightly closed. Throw away any unused medicine after the expiration date. NOTE: This sheet is a summary. It may not cover all possible information. If you have questions about this medicine, talk to your doctor, pharmacist, or health care provider.  2015, Elsevier/Gold  Standard. (2010-08-23 11:55:44)

## 2014-07-21 ENCOUNTER — Other Ambulatory Visit (HOSPITAL_COMMUNITY): Payer: Self-pay | Admitting: Preventative Medicine

## 2014-07-21 DIAGNOSIS — M5416 Radiculopathy, lumbar region: Secondary | ICD-10-CM

## 2014-07-27 ENCOUNTER — Ambulatory Visit (HOSPITAL_COMMUNITY): Payer: Medicaid Other

## 2014-09-23 DIAGNOSIS — M545 Low back pain, unspecified: Secondary | ICD-10-CM | POA: Insufficient documentation

## 2015-01-25 ENCOUNTER — Encounter (HOSPITAL_COMMUNITY): Payer: Self-pay | Admitting: Emergency Medicine

## 2015-01-25 ENCOUNTER — Emergency Department (HOSPITAL_COMMUNITY)
Admission: EM | Admit: 2015-01-25 | Discharge: 2015-01-25 | Disposition: A | Discharge status: Home / Self Care | Payer: Medicaid Other | Attending: Emergency Medicine | Admitting: Emergency Medicine

## 2015-01-25 DIAGNOSIS — Z791 Long term (current) use of non-steroidal anti-inflammatories (NSAID): Secondary | ICD-10-CM | POA: Insufficient documentation

## 2015-01-25 DIAGNOSIS — Z720 Tobacco use: Secondary | ICD-10-CM | POA: Insufficient documentation

## 2015-01-25 DIAGNOSIS — Z79899 Other long term (current) drug therapy: Secondary | ICD-10-CM | POA: Insufficient documentation

## 2015-01-25 DIAGNOSIS — N611 Abscess of the breast and nipple: Secondary | ICD-10-CM

## 2015-01-25 DIAGNOSIS — N61 Inflammatory disorders of breast: Secondary | ICD-10-CM | POA: Insufficient documentation

## 2015-01-25 DIAGNOSIS — Z792 Long term (current) use of antibiotics: Secondary | ICD-10-CM | POA: Insufficient documentation

## 2015-01-25 MED ORDER — LIDOCAINE HCL (PF) 1 % IJ SOLN
INTRAMUSCULAR | Status: AC
  Administered 2015-01-25: 11:00:00
  Filled 2015-01-25: qty 5

## 2015-01-25 MED ORDER — SULFAMETHOXAZOLE-TRIMETHOPRIM 800-160 MG PO TABS: 1.0000 | ORAL_TABLET | Freq: Two times a day (BID) | ORAL | Status: DC

## 2015-01-25 MED ORDER — HYDROCODONE-ACETAMINOPHEN 5-325 MG PO TABS: 1.0000 | ORAL_TABLET | ORAL | Status: DC | PRN

## 2015-01-25 MED ORDER — HYDROCODONE-ACETAMINOPHEN 5-325 MG PO TABS
1.0000 | ORAL_TABLET | Freq: Once | ORAL | Status: AC
  Administered 2015-01-25: 1 via ORAL
  Filled 2015-01-25: qty 1

## 2015-01-25 NOTE — Discharge Instructions (Signed)

## 2015-01-25 NOTE — Care Management Note (Signed)
ED/CM noted patient did not have health insurance and/or PCP listed in the computer.  Patient was given the [place] resource handout with information on the clinics, food pantries, and the handout for new health insurance sign-up. Pt was also given a Rx discount card. Patient expressed appreciation for information received. 

## 2015-01-25 NOTE — ED Notes (Signed)
PA at bedside.

## 2015-01-25 NOTE — ED Notes (Signed)
Pt states that he has a red, raised area around left nipple for the past 2 weeks.

## 2015-01-26 NOTE — ED Provider Notes (Signed)
CSN: [id number]     Arrival date & time 01/25/15  1000 History   First MD Initiated Contact with Patient 01/25/15 1019     Chief Complaint  Patient presents with  . Abscess     (Consider location/radiation/quality/duration/timing/severity/associated sxs/prior Treatment) Patient is a 34 y.o. Lopez presenting with abscess. The history is provided by the patient.  Abscess Location:  Torso Torso abscess location:  L chest Abscess quality: fluctuance, induration, painful and redness   Red streaking: no   Duration:  2 weeks Progression:  Worsening Pain details:    Quality:  Sharp, throbbing and shooting   Severity:  Severe   Duration:  1 week   Timing:  Constant   Progression:  Worsening Chronicity:  New Context: not diabetes, not immunosuppression, not injected drug use and not skin injury   Context comment:  Pt states started as a small "hair bump" Relieved by:  None tried Worsened by:  Nothing tried Ineffective treatments:  None tried Associated symptoms: no fever, no nausea and no vomiting   Risk factors: no prior abscess   Risk factors comment:  Stated mother has h/o abscesses   History reviewed. No pertinent past medical history. Past Surgical History  Procedure Laterality Date  . Fracture surgery     No family history on file. History  Substance Use Topics  . Smoking status: Current Every Day Smoker -- 1.00 packs/day    Types: Cigarettes  . Smokeless tobacco: Not on file  . Alcohol Use: 2.4 oz/week    4 Shots of liquor per week    Review of Systems  Constitutional: Negative for fever and chills.  Respiratory: Negative for shortness of breath and wheezing.   Gastrointestinal: Negative for nausea and vomiting.  Skin:       Negative except as mentioned in HPI.    Neurological: Negative for numbness.      Allergies  Review of patient's allergies indicates no known allergies.  Home Medications   Prior to Admission medications   Medication Sig Start Date  End Date Taking? Authorizing Provider  diphenoxylate-atropine (LOMOTIL) 2.5-0.025 MG per tablet Take 1 tablet by mouth 4 (four) times daily as needed for diarrhea or loose stools. 09/02/13   Gilda Crease, MD  HYDROcodone-acetaminophen (NORCO/VICODIN) 5-325 MG per tablet Take 1 tablet by mouth every 4 (four) hours as needed. 01/25/15   Burgess Amor, PA-C  naproxen (NAPROSYN) 500 MG tablet Take 1 tablet (500 mg total) by mouth 2 (two) times daily. 06/17/14   Dione Booze, MD  orphenadrine (NORFLEX) 100 MG tablet Take 1 tablet (100 mg total) by mouth 2 (two) times daily. 06/17/14   Dione Booze, MD  promethazine (PHENERGAN) 25 MG tablet Take 1 tablet (25 mg total) by mouth every 6 (six) hours as needed for nausea. 09/02/13   Gilda Crease, MD  sulfamethoxazole-trimethoprim (SEPTRA DS) 800-160 MG per tablet Take 1 tablet by mouth every 12 (twelve) hours. 01/25/15   Burgess Amor, PA-C  traMADol (ULTRAM) 50 MG tablet Take 1 tablet (50 mg total) by mouth every 6 (six) hours as needed. 06/17/14   Dione Booze, MD   BP 157/93 mmHg  Pulse 96  Temp(Src) 97.6 F (36.4 C) (Oral)  Resp 14  Ht  (1.778 m)  Wt 175 lb (79.379 kg)  BMI 25.11 kg/m2  SpO2 100% Physical Exam  Constitutional: He is oriented to person, place, and time. He appears well-developed and well-nourished.  HENT:  Head: Normocephalic.  Cardiovascular: Normal rate.  Pulmonary/Chest: Effort normal.  Neurological: He is alert and oriented to person, place, and time. No sensory deficit.  Skin:  Large fluctuant raised, erythematous and tender lesion lateral inferior left breast. Pointing without drainage, no red streaking.  Induration along periphery of lesion, central fluctuance.    ED Course  Procedures (including critical care time)  INCISION AND DRAINAGE Performed by: Burgess AmorIDOL, Keron Koffman Consent: Verbal consent obtained. Risks and benefits: risks, benefits and alternatives were discussed Type: abscess  Body area: left chest  wall/breast  Anesthesia: local infiltration  Incision was made with a scalpel.  Local anesthetic: lidocaine 1% without epinephrine  Anesthetic total: 5 ml  Complexity: complex Blunt dissection to break up loculations,  Flushed with saline multiple times  Drainage: purulent  Drainage amount: copious  Packing material: pt deferred  Patient tolerance: Patient tolerated the procedure well with no immediate complications.    Labs Review Labs Reviewed  CULTURE, ROUTINE-ABSCESS    Imaging Review No results found.   EKG Interpretation None      MDM   Final diagnoses:  Abscess of breast    Wound cx sent. Pt prescribed bactrim, hydrocodone, advised warm compresses, dressing changes.  Return here for a recheck if site does not continue to improve or for any worsened sx over the next 48 hours.  Pt understands plan.  The patient appears reasonably screened and/or stabilized for discharge and I doubt any other medical condition or other [facility] requiring further screening, evaluation, or treatment in the ED at this time prior to discharge.     Burgess AmorJulie Kingston Guiles, PA-C 01/26/15 [id number]  Donnetta HutchingBrian Cook, MD 01/26/15 2018

## 2015-01-28 LAB — CULTURE, ROUTINE-ABSCESS

## 2016-04-01 ENCOUNTER — Encounter (HOSPITAL_COMMUNITY): Payer: Self-pay | Admitting: Emergency Medicine

## 2016-04-01 ENCOUNTER — Emergency Department (HOSPITAL_COMMUNITY)
Admission: EM | Admit: 2016-04-01 | Discharge: 2016-04-01 | Disposition: A | Discharge status: Home / Self Care | Payer: Self-pay | Attending: Emergency Medicine | Admitting: Emergency Medicine

## 2016-04-01 DIAGNOSIS — F17210 Nicotine dependence, cigarettes, uncomplicated: Secondary | ICD-10-CM | POA: Insufficient documentation

## 2016-04-01 DIAGNOSIS — K529 Noninfective gastroenteritis and colitis, unspecified: Secondary | ICD-10-CM | POA: Insufficient documentation

## 2016-04-01 DIAGNOSIS — Z79899 Other long term (current) drug therapy: Secondary | ICD-10-CM | POA: Insufficient documentation

## 2016-04-01 LAB — CBC
HCT: 43.5 % (ref 39.0–52.0)
HEMOGLOBIN: 15.1 g/dL (ref 13.0–17.0)
MCH: 31.6 pg (ref 26.0–34.0)
MCHC: 34.7 g/dL (ref 30.0–36.0)
MCV: 91 fL (ref 78.0–100.0)
Platelets: 183 10*3/uL (ref 150–400)
RBC: 4.78 MIL/uL (ref 4.22–5.81)
RDW: 13.5 % (ref 11.5–15.5)
WBC: 9.3 10*3/uL (ref 4.0–10.5)

## 2016-04-01 LAB — BASIC METABOLIC PANEL
Anion gap: 8 (ref 5–15)
BUN: 10 mg/dL (ref 6–20)
CALCIUM: 9.3 mg/dL (ref 8.9–10.3)
CO2: 27 mmol/L (ref 22–32)
Chloride: 105 mmol/L (ref 101–111)
Creatinine, Ser: 0.85 mg/dL (ref 0.61–1.24)
GFR calc Af Amer: >60 mL/min (ref >60)
GFR calc non Af Amer: >60 mL/min (ref >60)
GLUCOSE: 96 mg/dL (ref 65–99)
Potassium: 4.3 mmol/L (ref 3.5–5.1)
Sodium: 140 mmol/L (ref 135–145)

## 2016-04-01 MED ORDER — ONDANSETRON HCL 4 MG/2ML IJ SOLN
4.0000 mg | Freq: Once | INTRAMUSCULAR | Status: AC
  Administered 2016-04-01: 4 mg via INTRAVENOUS
  Filled 2016-04-01: qty 2

## 2016-04-01 MED ORDER — SODIUM CHLORIDE 0.9 % IV SOLN: INTRAVENOUS | Status: DC

## 2016-04-01 MED ORDER — SODIUM CHLORIDE 0.9 % IV BOLUS (SEPSIS)
1000.0000 mL | Freq: Once | INTRAVENOUS | Status: AC
  Administered 2016-04-01: 1000 mL via INTRAVENOUS

## 2016-04-01 MED ORDER — LOPERAMIDE HCL 2 MG PO TABS: 2.0000 mg | ORAL_TABLET | Freq: Four times a day (QID) | ORAL | Status: DC | PRN

## 2016-04-01 MED ORDER — PROMETHAZINE HCL 25 MG PO TABS: 25.0000 mg | ORAL_TABLET | Freq: Four times a day (QID) | ORAL | Status: DC | PRN

## 2016-04-01 NOTE — Discharge Instructions (Signed)
Food Choices to Help Relieve Diarrhea, Adult °When you have diarrhea, the foods you eat and your eating habits are very important. Choosing the right foods and drinks can help relieve diarrhea. Also, because diarrhea can last up to 7 days, you need to replace lost fluids and electrolytes (such as sodium, potassium, and chloride) in order to help prevent dehydration.  °WHAT GENERAL GUIDELINES DO I NEED TO FOLLOW? °· Slowly drink 1 cup (8 oz) of fluid for each episode of diarrhea. If you are getting enough fluid, your urine will be clear or pale yellow. °· Eat starchy foods. Some good choices include white rice, white toast, pasta, low-fiber cereal, baked potatoes (without the skin), saltine crackers, and bagels. °· Avoid large servings of any cooked vegetables. °· Limit fruit to two servings per day. A serving is ½ cup or 1 small piece. °· Choose foods with less than 2 g of fiber per serving. °· Limit fats to less than 8 tsp (38 g) per day. °· Avoid fried foods. °· Eat foods that have probiotics in them. Probiotics can be found in certain dairy products. °· Avoid foods and beverages that may increase the speed at which food moves through the stomach and intestines (gastrointestinal tract). Things to avoid include: °¨ High-fiber foods, such as dried fruit, raw fruits and vegetables, nuts, seeds, and whole grain foods. °¨ Spicy foods and high-fat foods. °¨ Foods and beverages sweetened with high-fructose corn syrup, honey, or sugar alcohols such as xylitol, sorbitol, and mannitol. °WHAT FOODS ARE RECOMMENDED? °Grains °White rice. White, French, or pita breads (fresh or toasted), including plain rolls, buns, or bagels. White pasta. Saltine, soda, or graham crackers. Pretzels. Low-fiber cereal. Cooked cereals made with water (such as cornmeal, farina, or cream cereals). Plain muffins. Matzo. Melba toast. Zwieback.  °Vegetables °Potatoes (without the skin). Strained tomato and vegetable juices. Most well-cooked and canned  vegetables without seeds. Tender lettuce. °Fruits °Cooked or canned applesauce, apricots, cherries, fruit cocktail, grapefruit, peaches, pears, or plums. Fresh bananas, apples without skin, cherries, grapes, cantaloupe, grapefruit, peaches, oranges, or plums.  °Meat and Other Protein Products °Baked or boiled chicken. Eggs. Tofu. Fish. Seafood. Smooth peanut butter. Ground or well-cooked tender beef, ham, veal, lamb, pork, or poultry.  °Dairy °Plain yogurt, kefir, and unsweetened liquid yogurt. Lactose-free milk, buttermilk, or soy milk. Plain hard cheese. °Beverages °Sport drinks. Clear broths. Diluted fruit juices (except prune). Regular, caffeine-free sodas such as ginger ale. Water. Decaffeinated teas. Oral rehydration solutions. Sugar-free beverages not sweetened with sugar alcohols. °Other °Bouillon, broth, or soups made from recommended foods.  °The items listed above may not be a complete list of recommended foods or beverages. Contact your dietitian for more options. °WHAT FOODS ARE NOT RECOMMENDED? °Grains °Whole grain, whole wheat, bran, or rye breads, rolls, pastas, crackers, and cereals. Wild or brown rice. Cereals that contain more than 2 g of fiber per serving. Corn tortillas or taco shells. Cooked or dry oatmeal. Granola. Popcorn. °Vegetables °Raw vegetables. Cabbage, broccoli, Brussels sprouts, artichokes, baked beans, beet greens, corn, kale, legumes, peas, sweet potatoes, and yams. Potato skins. Cooked spinach and cabbage. °Fruits °Dried fruit, including raisins and dates. Raw fruits. Stewed or dried prunes. Fresh apples with skin, apricots, mangoes, pears, raspberries, and strawberries.  °Meat and Other Protein Products °Chunky peanut butter. Nuts and seeds. Beans and lentils. Bacon.  °Dairy °High-fat cheeses. Milk, chocolate milk, and beverages made with milk, such as milk shakes. Cream. Ice cream. °Sweets and Desserts °Sweet rolls, doughnuts, and sweet breads.   Pancakes and waffles. Fats and  Oils Butter. Cream sauces. Margarine. Salad oils. Plain salad dressings. Olives. Avocados.  Beverages Caffeinated beverages (such as coffee, tea, soda, or energy drinks). Alcoholic beverages. Fruit juices with pulp. Prune juice. Soft drinks sweetened with high-fructose corn syrup or sugar alcohols. Other Coconut. Hot sauce. Chili powder. Mayonnaise. Gravy. Cream-based or milk-based soups.  The items listed above may not be a complete list of foods and beverages to avoid. Contact your dietitian for more information. WHAT SHOULD I DO IF I BECOME DEHYDRATED? Diarrhea can sometimes lead to dehydration. Signs of dehydration include dark urine and dry mouth and skin. If you think you are dehydrated, you should rehydrate with an oral rehydration solution. These solutions can be purchased at pharmacies, retail stores, or online.  Drink -1 cup (120-240 mL) of oral rehydration solution each time you have an episode of diarrhea. If drinking this amount makes your diarrhea worse, try drinking smaller amounts more often. For example, drink 1-3 tsp (5-15 mL) every 5-10 minutes.  A general rule for staying hydrated is to drink 1-2 L of fluid per day. Talk to your health care provider about the specific amount you should be drinking each day. Drink enough fluids to keep your urine clear or pale yellow.   This information is not intended to replace advice given to you by your health care provider. Make sure you discuss any questions you have with your health care provider.   Labs without any significant abnormalities. Take the Phenergan as needed for the nausea and vomiting. Take Imodium A-D as needed for the diarrhea. 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 Return for any new or worse symptoms.   Document Released: 03/01/2004 Document Revised: 12/31/2014 Document Reviewed: 11/02/2013 Elsevier Interactive Patient Education [employer]2016 [employer].

## 2016-04-01 NOTE — ED Notes (Signed)
Patient c/o nausea, vomiting, and diarrhea that started last night. Patient reports generalized abd pain with eating or drinking. Unsure of fevers but reports sweating.

## 2016-04-01 NOTE — ED Provider Notes (Signed)
CSN: [id number]     Arrival date & time 04/01/16  1344 History   First MD Initiated Contact with Patient 04/01/16 1400     Chief Complaint  Patient presents with  . Emesis     (Consider location/radiation/quality/duration/timing/severity/associated sxs/prior Treatment) Patient is a 35 y.o. male presenting with vomiting. The history is provided by the patient.  Emesis Associated symptoms: abdominal pain, chills and diarrhea    patient with acute onset of nausea vomiting and diarrhea yesterday got worse during the night and worse this morning. Several episodes of vomiting and diarrhea no blood in them. Associated with some crampy generalized abdominal pain. No severe abdominal pain. Not sure of fevers but has been diaphoretic. Has had some chills.  History reviewed. No pertinent past medical history. Past Surgical History  Procedure Laterality Date  . Fracture surgery     No family history on file. Social History  Substance Use Topics  . Smoking status: Current Every Day Smoker -- 1.00 packs/day    Types: Cigarettes  . Smokeless tobacco: Never Used  . Alcohol Use: 2.4 oz/week    4 Shots of liquor per week    Review of Systems  Constitutional: Positive for chills and diaphoresis.  HENT: Negative for congestion.   Eyes: Negative for visual disturbance.  Respiratory: Negative for shortness of breath.   Cardiovascular: Negative for chest pain.  Gastrointestinal: Positive for nausea, vomiting, abdominal pain and diarrhea.  Genitourinary: Negative for dysuria.  Musculoskeletal: Negative for back pain.  Skin: Negative for rash.  Neurological: Positive for dizziness.  Psychiatric/Behavioral: Negative for confusion.      Allergies  Review of patient's allergies indicates no known allergies.  Home Medications   Prior to Admission medications   Medication Sig Start Date End Date Taking? Authorizing Provider  Multiple Vitamin (MULTIVITAMIN WITH MINERALS) TABS tablet Take 1  tablet by mouth daily.   Yes Historical Provider, MD   BP 146/94 mmHg  Pulse 97  Temp(Src) 98.4 F (36.9 C) (Oral)  Resp 18  Ht 5\' 11"  (1.803 m)  Wt 79.379 kg  BMI 24.42 kg/m2  SpO2 99% Physical Exam  Constitutional: He is oriented to person, place, and time. He appears well-developed and well-nourished.  HENT:  Head: Normocephalic and atraumatic.  Mouth/Throat: Oropharynx is clear and moist.  Eyes: Conjunctivae and EOM are normal. Pupils are equal, round, and reactive to light.  Neck: Normal range of motion. Neck supple.  Cardiovascular: Normal rate, regular rhythm and normal heart sounds.   No murmur heard. Pulmonary/Chest: Effort normal.  Abdominal: Soft. Bowel sounds are normal. There is no tenderness.  Musculoskeletal: Normal range of motion. He exhibits no edema.  Neurological: He is alert and oriented to person, place, and time. No cranial nerve deficit. He exhibits normal muscle tone. Coordination normal.  Skin: Skin is warm. No rash noted.  Nursing note and vitals reviewed.   ED Course  Procedures (including critical care time) Labs Review Labs Reviewed  CBC  BASIC METABOLIC PANEL    Imaging Review No results found. I have personally reviewed and evaluated these images and lab results as part of my medical decision-making.   EKG Interpretation None      MDM   Final diagnoses:  Gastroenteritis    Patient with acute onset of nausea vomiting and diarrhea last evening got worse today. associated with some abdominal crampy pain. Abdomen soft nontender on exam. Suspect viral gastroenteritis. Other family members have had something similar.   We'll treat with IV hydration antinausea  medicine. Reassess if discharged home and discharged home with Phenergan and Imodium AD.   Vanetta Mulders, MD 04/01/16 [phone]

## 2018-09-18 ENCOUNTER — Emergency Department (HOSPITAL_COMMUNITY): Payer: Self-pay

## 2018-09-18 ENCOUNTER — Other Ambulatory Visit: Payer: Self-pay

## 2018-09-18 ENCOUNTER — Emergency Department (HOSPITAL_COMMUNITY)
Admission: EM | Admit: 2018-09-18 | Discharge: 2018-09-18 | Disposition: A | Discharge status: Home / Self Care | Payer: Self-pay | Attending: Emergency Medicine | Admitting: Emergency Medicine

## 2018-09-18 ENCOUNTER — Encounter (HOSPITAL_COMMUNITY): Payer: Self-pay | Admitting: Emergency Medicine

## 2018-09-18 DIAGNOSIS — J4521 Mild intermittent asthma with (acute) exacerbation: Secondary | ICD-10-CM

## 2018-09-18 DIAGNOSIS — J45901 Unspecified asthma with (acute) exacerbation: Secondary | ICD-10-CM | POA: Insufficient documentation

## 2018-09-18 DIAGNOSIS — Z79899 Other long term (current) drug therapy: Secondary | ICD-10-CM | POA: Insufficient documentation

## 2018-09-18 DIAGNOSIS — F17210 Nicotine dependence, cigarettes, uncomplicated: Secondary | ICD-10-CM | POA: Insufficient documentation

## 2018-09-18 HISTORY — DX: Unspecified asthma, uncomplicated: J45.909

## 2018-09-18 LAB — CBC
HCT: 38.8 % — ABNORMAL LOW (ref 39.0–52.0)
Hemoglobin: 13.2 g/dL (ref 13.0–17.0)
MCH: 32.2 pg (ref 26.0–34.0)
MCHC: 34 g/dL (ref 30.0–36.0)
MCV: 94.6 fL (ref 78.0–100.0)
Platelets: 208 10*3/uL (ref 150–400)
RBC: 4.1 MIL/uL — ABNORMAL LOW (ref 4.22–5.81)
RDW: 13.5 % (ref 11.5–15.5)
WBC: 10.2 10*3/uL (ref 4.0–10.5)

## 2018-09-18 LAB — BASIC METABOLIC PANEL
Anion gap: 8 (ref 5–15)
BUN: 8 mg/dL (ref 6–20)
CHLORIDE: 107 mmol/L (ref 98–111)
CO2: 23 mmol/L (ref 22–32)
Calcium: 9.3 mg/dL (ref 8.9–10.3)
Creatinine, Ser: 0.65 mg/dL (ref 0.61–1.24)
GFR calc Af Amer: >60 mL/min (ref >60)
GFR calc non Af Amer: >60 mL/min (ref >60)
Glucose, Bld: 88 mg/dL (ref 70–99)
POTASSIUM: 3.8 mmol/L (ref 3.5–5.1)
Sodium: 138 mmol/L (ref 135–145)

## 2018-09-18 LAB — TROPONIN I: Troponin I: <0.03 ng/mL (ref <0.03)

## 2018-09-18 MED ORDER — ALBUTEROL SULFATE HFA 108 (90 BASE) MCG/ACT IN AERS: 2.0000 | INHALATION_SPRAY | RESPIRATORY_TRACT | 0 refills | Status: DC | PRN

## 2018-09-18 MED ORDER — IPRATROPIUM BROMIDE 0.02 % IN SOLN
0.5000 mg | Freq: Once | RESPIRATORY_TRACT | Status: AC
  Administered 2018-09-18: 0.5 mg via RESPIRATORY_TRACT
  Filled 2018-09-18: qty 2.5

## 2018-09-18 MED ORDER — PREDNISONE 20 MG PO TABS: 40.0000 mg | ORAL_TABLET | Freq: Every day | ORAL | 0 refills | Status: DC

## 2018-09-18 MED ORDER — PREDNISONE 50 MG PO TABS
60.0000 mg | ORAL_TABLET | Freq: Once | ORAL | Status: AC
  Administered 2018-09-18: 60 mg via ORAL
  Filled 2018-09-18: qty 1

## 2018-09-18 MED ORDER — IPRATROPIUM-ALBUTEROL 0.5-2.5 (3) MG/3ML IN SOLN
3.0000 mL | Freq: Once | RESPIRATORY_TRACT | Status: AC
  Administered 2018-09-18: 3 mL via RESPIRATORY_TRACT
  Filled 2018-09-18: qty 3

## 2018-09-18 NOTE — ED Notes (Signed)
Ambulated successfully

## 2018-09-18 NOTE — ED Triage Notes (Signed)
New job working in [employer], hx of asthma, notice he feels SOB and tightness in chest worse when working

## 2018-09-18 NOTE — ED Provider Notes (Signed)
[facility] EMERGENCY DEPARTMENT Provider Note   CSN: [id number] Arrival date & time: 09/18/18  1203     History   Chief Complaint Chief Complaint  Patient presents with  . Shortness of Breath    HPI Alejandro Lopez is a 37 y.o. male.   Shortness of Breath    Pt was seen at 1605. Per pt, c/o gradual onset and persistence of constant cough for the past 1 week. Has been associated with SOB and constant chest "tightness" for the past 2 days. 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 Pt endorses hx of asthma, does not have MDI. Denies fevers, no rash, no injury, no palpitations, no back pain, no abd pain, no N/V/D.    Past Medical History:  Diagnosis Date  . Asthma     There are no active problems to display for this patient.   Past Surgical History:  Procedure Laterality Date  . FRACTURE SURGERY          Home Medications    Prior to Admission medications   Medication Sig Start Date End Date Taking? Authorizing Provider  loperamide (IMODIUM A-D) 2 MG tablet Take 1 tablet (2 mg total) by mouth 4 (four) times daily as needed for diarrhea or loose stools. 04/01/16   Vanetta Mulders, MD  Multiple Vitamin (MULTIVITAMIN WITH MINERALS) TABS tablet Take 1 tablet by mouth daily.    [provider]  promethazine (PHENERGAN) 25 MG tablet Take 1 tablet (25 mg total) by mouth every 6 (six) hours as needed. 04/01/16   Vanetta Mulders, MD    Family History No family history on file.   Social History Social History   Tobacco Use  . Smoking status: Current Every Day Smoker    Packs/day: 1.00    Types: Cigarettes  . Smokeless tobacco: Never Used  Substance Use Topics  . Alcohol use: Yes    Alcohol/week: 4.0 standard drinks    Types: 4 Shots of liquor per week  . Drug use: No     Allergies   Patient has no known allergies.   Review of Systems Review of Systems  Respiratory: Positive for shortness of breath.   ROS:  Statement: All systems negative except as marked or noted in the HPI; Constitutional: Negative for fever and chills. ; ; Eyes: Negative for eye pain, redness and discharge. ; ; ENMT: Negative for ear pain, hoarseness, nasal congestion, sinus pressure and sore throat. ; ; Cardiovascular: +CP. Negative for palpitations, diaphoresis, and peripheral edema. ; ; Respiratory: +cough, SOB. Negative for stridor. ; ; Gastrointestinal: Negative for nausea, vomiting, diarrhea, abdominal pain, blood in stool, hematemesis, jaundice and rectal bleeding. . ; ; Genitourinary: Negative for dysuria, flank pain and hematuria. ; ; Musculoskeletal: Negative for back pain and neck pain. Negative for swelling and trauma.; ; Skin: Negative for pruritus, rash, abrasions, blisters, bruising and skin lesion.; ; Neuro: Negative for headache, lightheadedness and neck stiffness. Negative for weakness, altered level of consciousness, altered mental status, extremity weakness, paresthesias, involuntary movement, seizure and syncope.      Physical Exam Updated Vital Signs BP 114/75   Pulse 89   Temp 98.5 F (36.9 C) (Oral)   Resp 15   Ht 5\' 10"  (1.778 m)   Wt 70.8 kg   SpO2 96%   BMI 22.38 kg/m   Physical Exam 1610: Physical examination:  Nursing notes reviewed; Vital signs and O2 SAT reviewed;  Constitutional: Well developed, Well  nourished, Well hydrated, In no acute distress; Head:  Normocephalic, atraumatic; Eyes: EOMI, PERRL, No scleral icterus; ENMT: Mouth and pharynx normal, Mucous membranes moist; Neck: Supple, Full range of motion, No lymphadenopathy; Cardiovascular: Regular rate and rhythm, No gallop; Respiratory: Breath sounds diminished & equal bilaterally, No wheezes. Speaking full sentences with ease, Normal respiratory effort/excursion; Chest: Nontender, Movement normal; Abdomen: Soft, Nontender, Nondistended, Normal bowel sounds; Genitourinary: No CVA tenderness; Extremities: Peripheral pulses normal, No  tenderness, No edema, No calf edema or asymmetry.; Neuro: AA&Ox3, Major CN grossly intact.  Speech clear. No gross focal motor or sensory deficits in extremities.; Skin: Color normal, Warm, Dry.    ED Treatments / Results  Labs (all labs ordered are listed, but only abnormal results are displayed)   EKG EKG Interpretation  Date/Time:  Thursday September 18 2018 12:23:58 EDT Ventricular Rate:  97 PR Interval:  98 QRS Duration: 78 QT Interval:  346 QTC Calculation: 439 R Axis:   86 Text Interpretation:  Sinus rhythm with short PR Abnormal QRS-T angle, consider primary T wave abnormality Abnormal ECG No previous ECGs available Confirmed by Vanetta Mulders [phone]) on 09/18/2018 12:41:23 PM    EKG Interpretation  Date/Time:  Thursday September 18 2018 19:14:20 EDT Ventricular Rate:  80 PR Interval:  98 QRS Duration: 90 QT Interval:  423 QTC Calculation: 488 R Axis:   76 Text Interpretation:  Sinus rhythm ST elev, probable normal early repol pattern Borderline prolonged QT interval Baseline wander Since last tracing of earlier today No significant change was found Confirmed by Samuel Jester [phone]) on 09/18/2018 7:20:07 PM           Radiology   Procedures Procedures (including critical care time)  Medications Ordered in ED Medications  ipratropium-albuterol (DUONEB) 0.5-2.5 (3) MG/3ML nebulizer solution 3 mL (has no administration in time range)  ipratropium (ATROVENT) nebulizer solution 0.5 mg (has no administration in time range)     Initial Impression / Assessment and Plan / ED Course  I have reviewed the triage vital signs and the nursing notes.  Pertinent labs & imaging results that were available during my care of the patient were reviewed by me and considered in my medical decision making (see chart for details).  MDM Reviewed: previous chart, nursing note and vitals Reviewed previous: labs and ECG Interpretation: labs, ECG and x-ray   Results for  orders placed or performed during the hospital encounter of 09/18/18  Basic metabolic panel  Result Value Ref Range   Sodium 138 135 - 145 mmol/L   Potassium 3.8 3.5 - 5.1 mmol/L   Chloride 107 98 - 111 mmol/L   CO2 23 22 - 32 mmol/L   Glucose, Bld 88 70 - 99 mg/dL   BUN 8 6 - 20 mg/dL   Creatinine, Ser 1.91 0.61 - 1.24 mg/dL   Calcium 9.3 8.9 - 47.8 mg/dL   GFR calc non Af Amer >60 >60 mL/min   GFR calc Af Amer >60 >60 mL/min   Anion gap 8 5 - 15  CBC  Result Value Ref Range   WBC 10.2 4.0 - 10.5 K/uL   RBC 4.10 (L) 4.22 - 5.81 MIL/uL   Hemoglobin 13.2 13.0 - 17.0 g/dL   HCT 29.5 (L) 62.1 - 30.8 %   MCV 94.6 78.0 - 100.0 fL   MCH 32.2 26.0 - 34.0 pg   MCHC 34.0 30.0 - 36.0 g/dL   RDW 65.7 84.6 - 96.2 %   Platelets 208 150 - 400 K/uL  Troponin  I  Result Value Ref Range   Troponin I <0.03 <0.03 ng/mL   Dg Chest 2 View Result Date: 09/18/2018 CLINICAL DATA:  Shortness of breath and chest tightness. EXAM: CHEST - 2 VIEW COMPARISON:  None. FINDINGS: Lungs are clear. Heart size and pulmonary vascularity are normal. No adenopathy. No pneumothorax. No bone lesions. IMPRESSION: No edema or consolidation. Electronically Signed   By: Bretta Bang III M.D.   On: 09/18/2018 13:40    1925:  Doubt PE as cause for symptoms with low risk Wells and PERC negative.  Doubt ACS as cause for symptoms with normal troponin and EKG x2 today without acute STTW changes after 2 days of constant symptoms.  Pt states he "feels better" after neb.  NAD, lungs CTA bilat, no wheezing, resps easy, speaking full sentences, Sats 100% R/A.  Pt ambulated around the ED with Sats remaining 98-100 % R/A, resps easy, NAD.  Pt wants to go home now. Tx symptomatically at this time, f/u PMD. Dx and testing d/w pt and family.  Questions answered.  Verb understanding, agreeable to d/c home with outpt f/u.      Final Clinical Impressions(s) / ED Diagnoses   Final diagnoses:  None    ED Discharge Orders    None         Samuel Jester, DO 09/19/18 2140

## 2018-09-18 NOTE — Discharge Instructions (Signed)
Take the prescriptions as directed.  Use your albuterol inhaler (2 to 4 puffs) every 4 hours for the next 7 days, then as needed for cough, wheezing, or shortness of breath.  Call your regular medical doctor tomorrow morning to schedule a follow up appointment within the next 3 days.  Return to the Emergency Department immediately sooner if worsening.  ° °

## 2018-10-19 ENCOUNTER — Other Ambulatory Visit: Payer: Self-pay

## 2018-10-19 ENCOUNTER — Emergency Department (HOSPITAL_COMMUNITY)
Admission: EM | Admit: 2018-10-19 | Discharge: 2018-10-19 | Disposition: A | Discharge status: Home / Self Care | Payer: Self-pay | Attending: Emergency Medicine | Admitting: Emergency Medicine

## 2018-10-19 ENCOUNTER — Encounter (HOSPITAL_COMMUNITY): Payer: Self-pay | Admitting: Emergency Medicine

## 2018-10-19 DIAGNOSIS — R059 Cough, unspecified: Secondary | ICD-10-CM

## 2018-10-19 DIAGNOSIS — R05 Cough: Secondary | ICD-10-CM

## 2018-10-19 DIAGNOSIS — J069 Acute upper respiratory infection, unspecified: Secondary | ICD-10-CM | POA: Insufficient documentation

## 2018-10-19 DIAGNOSIS — F17210 Nicotine dependence, cigarettes, uncomplicated: Secondary | ICD-10-CM | POA: Insufficient documentation

## 2018-10-19 DIAGNOSIS — J45909 Unspecified asthma, uncomplicated: Secondary | ICD-10-CM | POA: Insufficient documentation

## 2018-10-19 MED ORDER — DEXAMETHASONE 4 MG PO TABS
ORAL_TABLET | ORAL | Status: AC
  Filled 2018-10-19: qty 1

## 2018-10-19 MED ORDER — AZITHROMYCIN 250 MG PO TABS: ORAL_TABLET | ORAL | 0 refills | Status: DC

## 2018-10-19 MED ORDER — DEXAMETHASONE 4 MG PO TABS
8.0000 mg | ORAL_TABLET | Freq: Once | ORAL | Status: AC
  Administered 2018-10-19: 8 mg via ORAL
  Filled 2018-10-19: qty 2

## 2018-10-19 MED ORDER — ALBUTEROL SULFATE (2.5 MG/3ML) 0.083% IN NEBU
INHALATION_SOLUTION | RESPIRATORY_TRACT | Status: AC
  Administered 2018-10-19: 2.5 mg
  Filled 2018-10-19: qty 3

## 2018-10-19 MED ORDER — IPRATROPIUM-ALBUTEROL 0.5-2.5 (3) MG/3ML IN SOLN
3.0000 mL | Freq: Once | RESPIRATORY_TRACT | Status: AC
  Administered 2018-10-19: 3 mL via RESPIRATORY_TRACT
  Filled 2018-10-19: qty 3

## 2018-10-19 NOTE — ED Provider Notes (Signed)
[facility] EMERGENCY DEPARTMENT Provider Note   CSN: [id number] Arrival date & time: 10/19/18  0203     History   Chief Complaint Chief Complaint  Patient presents with  . Cough    HPI Alejandro Lopez is a 37 y.o. male.  The history is provided by the patient.  Cough  This is a new problem. The current episode started more than 1 week ago. The problem occurs every few minutes. The problem has been gradually worsening. The cough is productive of sputum. There has been no fever. Associated symptoms include chills, sore throat, myalgias and shortness of breath. Associated symptoms comments: CP with cough . Treatments tried: albuterol MDI. The treatment provided mild relief. He is a smoker. His past medical history is significant for asthma.  With history of asthma presents with cough.  He reports for over a week he has been having increasing cough with yellow thick sputum production.  No fevers but reports chills.  He reports myalgias.  He reports chest pain with cough.  He reports feeling short of breath.  Past Medical History:  Diagnosis Date  . Asthma     There are no active problems to display for this patient.   Past Surgical History:  Procedure Laterality Date  . FRACTURE SURGERY          Home Medications    Prior to Admission medications   Medication Sig Start Date End Date Taking? Authorizing Provider  albuterol (PROVENTIL HFA;VENTOLIN HFA) 108 (90 Base) MCG/ACT inhaler Inhale 2 puffs into the lungs every 4 (four) hours as needed for wheezing or shortness of breath. 09/18/18   Samuel Jester, DO  predniSONE (DELTASONE) 20 MG tablet Take 2 tablets (40 mg total) by mouth daily. 09/18/18   Samuel Jester, DO    Family History No family history on file.  Social History Social History   Tobacco Use  . Smoking status: Current Every Day Smoker    Packs/day: 1.00    Types: Cigarettes  . Smokeless tobacco: Never Used  Substance Use Topics  . Alcohol  use: Yes    Alcohol/week: 4.0 standard drinks    Types: 4 Shots of liquor per week    Comment: every day   . Drug use: No     Allergies   Patient has no known allergies.   Review of Systems Review of Systems  Constitutional: Positive for chills. Negative for fever.  HENT: Positive for sore throat.   Respiratory: Positive for cough and shortness of breath.   Gastrointestinal: Negative for vomiting.  Musculoskeletal: Positive for myalgias.  All other systems reviewed and are negative.    Physical Exam Updated Vital Signs BP (!) 143/84 (BP Location: Right Arm)   Pulse 84   Temp 98.2 F (36.8 C) (Oral)   Resp 19   Ht 1.778 m (5\' 10" )   Wt 83.9 kg   SpO2 99%   BMI 26.54 kg/m   Physical Exam CONSTITUTIONAL: Well developed/well nourished HEAD: Normocephalic/atraumatic EYES: EOMI/PERRL ENMT: Mucous membranes moist, uvula midline, no erythema NECK: supple no meningeal signs SPINE/BACK:entire spine nontender CV: S1/S2 noted, no murmurs/rubs/gallops noted LUNGS: Lungs are clear to auscultation bilaterally, no apparent distress, harsh cough noted throughout exam ABDOMEN: soft, nontender, no rebound or guarding, bowel sounds noted throughout abdomen GU:no cva tenderness NEURO: Pt is awake/alert/appropriate, moves all extremitiesx4.  No facial droop.   EXTREMITIES: pulses normal/equal, full ROM, no lower extremity edema noted SKIN: warm, color normal PSYCH: no abnormalities of mood noted, alert  and oriented to situation   ED Treatments / Results  Labs (all labs ordered are listed, but only abnormal results are displayed) Labs Reviewed - No data to display  EKG None  Radiology No results found.  Procedures Procedures   Medications Ordered in ED Medications  ipratropium-albuterol (DUONEB) 0.5-2.5 (3) MG/3ML nebulizer solution 3 mL (3 mLs Nebulization Given 10/19/18 0334)  dexamethasone (DECADRON) tablet 8 mg (8 mg Oral Given 10/19/18 0326)  albuterol (PROVENTIL)  (2.5 MG/3ML) 0.083% nebulizer solution (2.5 mg  Given 10/19/18 0343)     Initial Impression / Assessment and Plan / ED Course  I have reviewed the triage vital signs and the nursing notes.       Presents for cough for over a week.  He was seen last month for an asthma exacerbation and completed a course of prednisone.  His chest x-ray at that time was negative.  He now presents with increasing cough for well over a week with sputum production, and he is a smoker.  Will give a dose of nebulizers and one-time dose of Decadron.  Due to presence of cough for over a week and other risk factors, will add on antibiotics.  He is otherwise in no acute distress, no hypoxia noted 4:17 AM Patient feels improved.  No crackles on exam.  Will discharge home.  Advised need to cut back or quit smoking.  We discussed strict return precautions He has albuterol at home Advised use of OTC meds for cough  Final Clinical Impressions(s) / ED Diagnoses   Final diagnoses:  Cough  Upper respiratory tract infection, unspecified type    ED Discharge Orders         Ordered    azithromycin (ZITHROMAX Z-PAK) 250 MG tablet     10/19/18 0413           Zadie Rhine, MD 10/19/18 [phone]

## 2018-10-19 NOTE — ED Triage Notes (Signed)
Cough and congestion x 1 week 

## 2019-09-18 IMAGING — DX DG CHEST 2V
2 series · 2 of 2 positions shown · non-contrast
Comparison: None.

CLINICAL DATA: Shortness of breath and chest tightness.

EXAM:
CHEST - 2 VIEW

[chest pa]
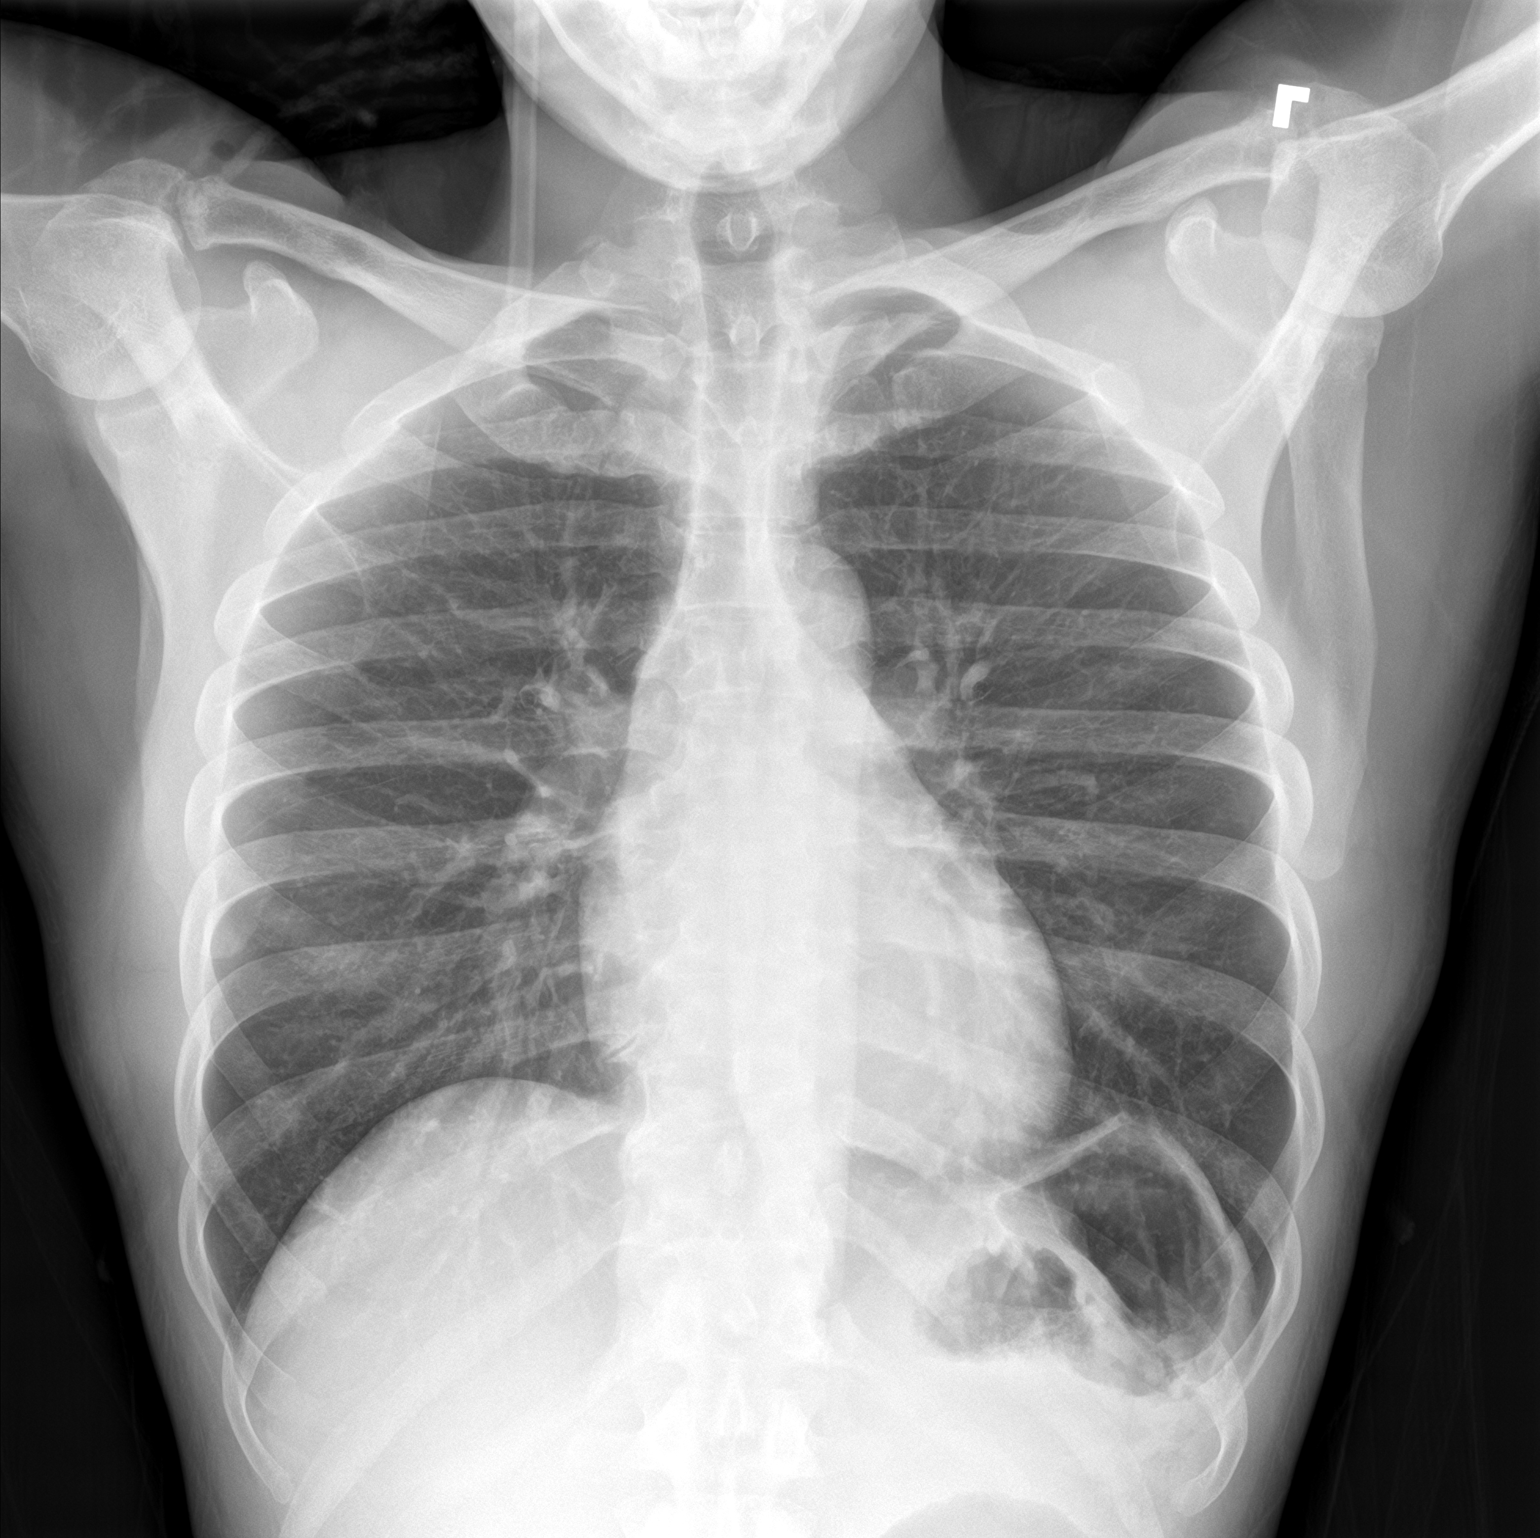

[chest lat]
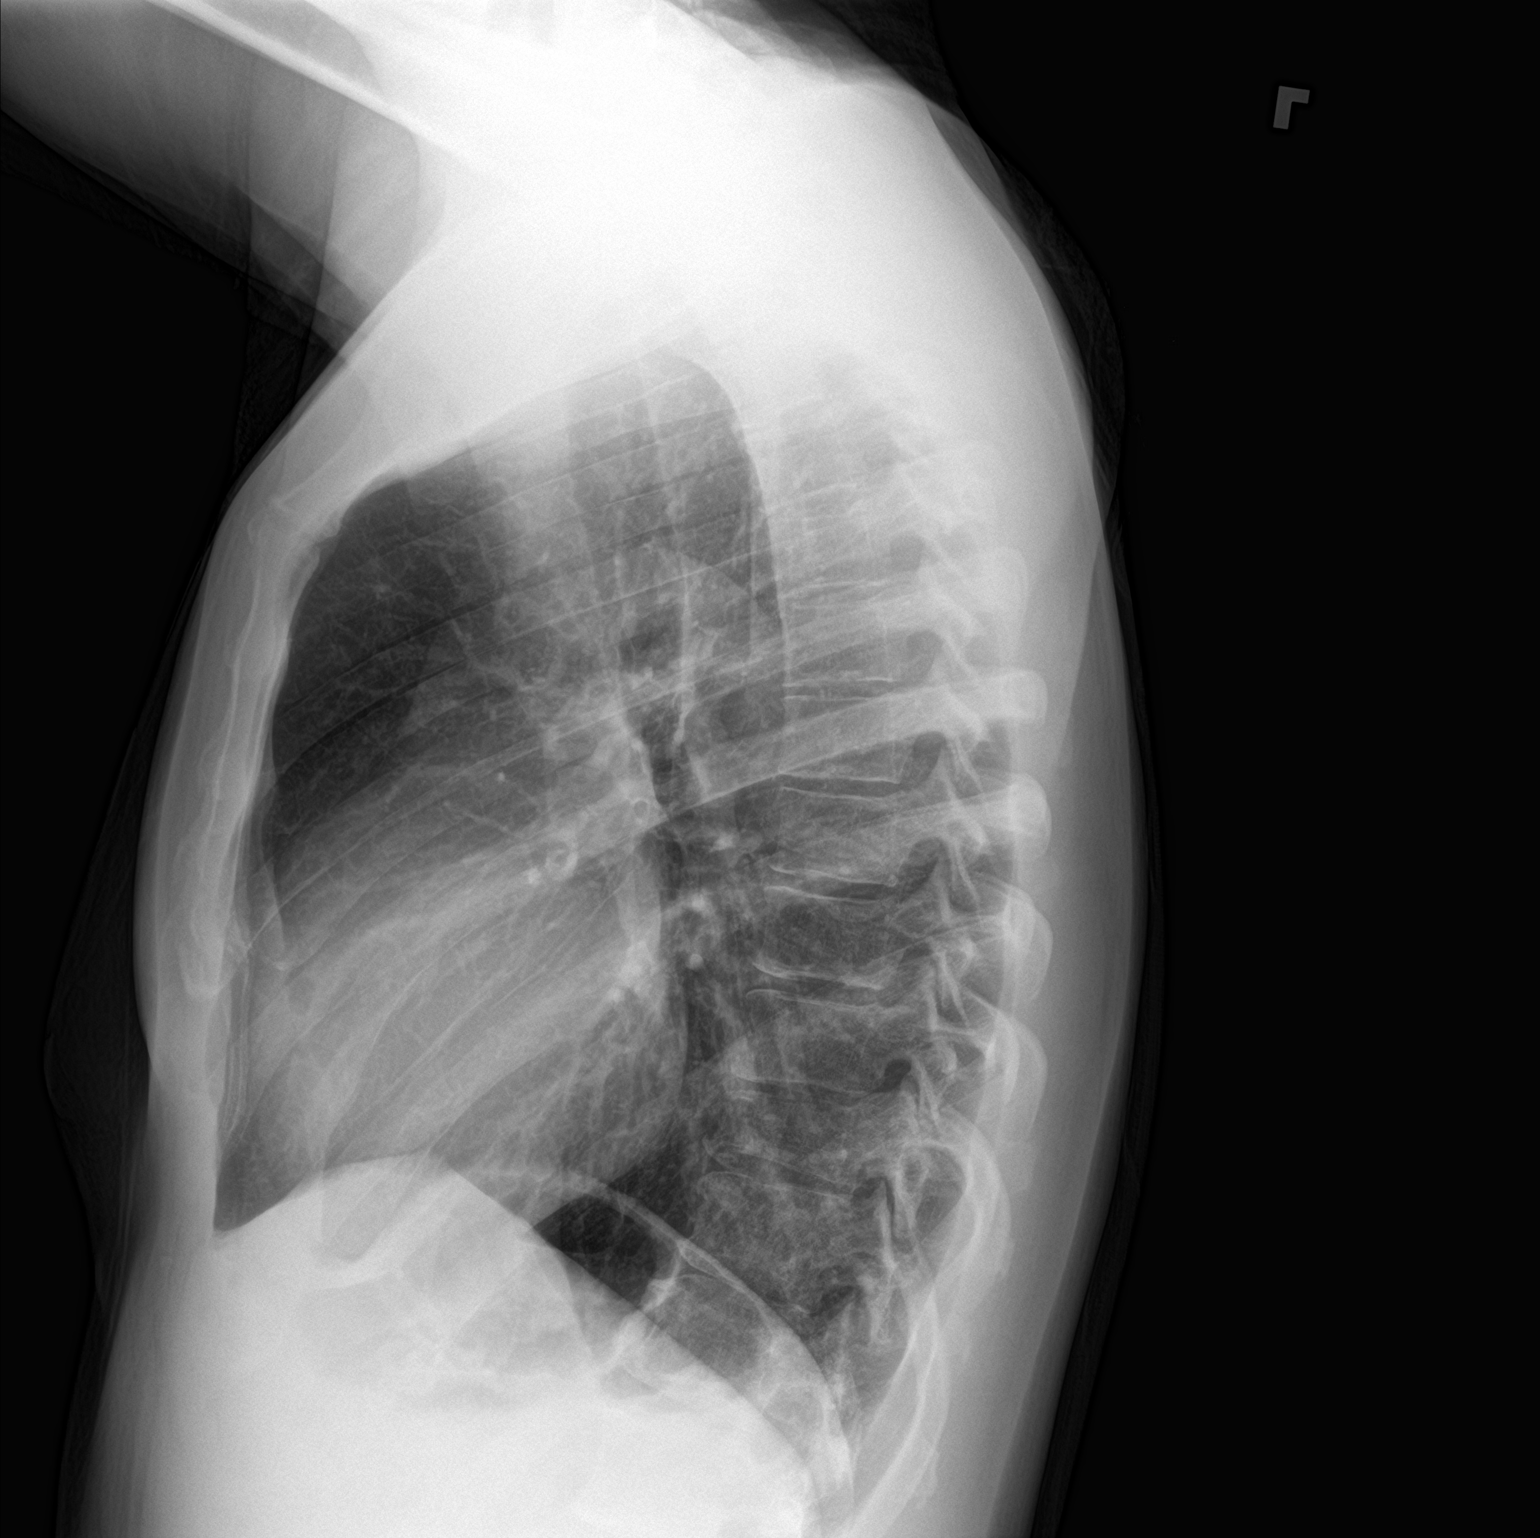

[2 of 2 positions shown; findings below may reference images not displayed]

FINDINGS: Lungs are clear. Heart size and pulmonary vascularity are normal. No
adenopathy. No pneumothorax. No bone lesions.
IMPRESSION: No edema or consolidation.

## 2020-01-17 ENCOUNTER — Emergency Department (HOSPITAL_COMMUNITY): Payer: Self-pay

## 2020-01-17 ENCOUNTER — Encounter (HOSPITAL_COMMUNITY): Payer: Self-pay | Admitting: *Deleted

## 2020-01-17 ENCOUNTER — Other Ambulatory Visit: Payer: Self-pay

## 2020-01-17 ENCOUNTER — Emergency Department (HOSPITAL_COMMUNITY)
Admission: EM | Admit: 2020-01-17 | Discharge: 2020-01-17 | Disposition: A | Discharge status: Home / Self Care | Payer: Self-pay | Attending: Emergency Medicine | Admitting: Emergency Medicine

## 2020-01-17 DIAGNOSIS — Y9389 Activity, other specified: Secondary | ICD-10-CM | POA: Insufficient documentation

## 2020-01-17 DIAGNOSIS — M25562 Pain in left knee: Secondary | ICD-10-CM | POA: Insufficient documentation

## 2020-01-17 DIAGNOSIS — J45909 Unspecified asthma, uncomplicated: Secondary | ICD-10-CM | POA: Insufficient documentation

## 2020-01-17 DIAGNOSIS — F17210 Nicotine dependence, cigarettes, uncomplicated: Secondary | ICD-10-CM | POA: Insufficient documentation

## 2020-01-17 DIAGNOSIS — M25561 Pain in right knee: Secondary | ICD-10-CM | POA: Insufficient documentation

## 2020-01-17 DIAGNOSIS — Y999 Unspecified external cause status: Secondary | ICD-10-CM | POA: Insufficient documentation

## 2020-01-17 DIAGNOSIS — Y929 Unspecified place or not applicable: Secondary | ICD-10-CM | POA: Insufficient documentation

## 2020-01-17 DIAGNOSIS — M25521 Pain in right elbow: Secondary | ICD-10-CM | POA: Insufficient documentation

## 2020-01-17 MED ORDER — LIDOCAINE 5 % EX PTCH: 1.0000 | MEDICATED_PATCH | CUTANEOUS | 0 refills | Status: DC

## 2020-01-17 MED ORDER — HYDROCODONE-ACETAMINOPHEN 5-325 MG PO TABS
1.0000 | ORAL_TABLET | Freq: Once | ORAL | Status: AC
  Administered 2020-01-17: 13:00:00 1 via ORAL
  Filled 2020-01-17: qty 1

## 2020-01-17 NOTE — Discharge Instructions (Signed)
Take Tylenol and ibuprofen as needed for pain.  I have also written a prescription for lidocaine patches.  You place this to the area where you have pain place for 12 hours and remove for 12 hours.  You must be patch free for 12 hours before he completes an additional patch.  I also suggest ice to your injuries.  Follow-up with PCP for reevaluation
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
## 2020-10-20 ENCOUNTER — Emergency Department (HOSPITAL_COMMUNITY): Payer: Self-pay

## 2020-10-20 ENCOUNTER — Emergency Department (HOSPITAL_COMMUNITY)
Admission: EM | Admit: 2020-10-20 | Discharge: 2020-10-20 | Disposition: A | Discharge status: Home / Self Care | Payer: Self-pay | Attending: Emergency Medicine | Admitting: Emergency Medicine

## 2020-10-20 ENCOUNTER — Encounter (HOSPITAL_COMMUNITY): Payer: Self-pay | Admitting: Emergency Medicine

## 2020-10-20 ENCOUNTER — Other Ambulatory Visit: Payer: Self-pay

## 2020-10-20 DIAGNOSIS — J45909 Unspecified asthma, uncomplicated: Secondary | ICD-10-CM | POA: Insufficient documentation

## 2020-10-20 DIAGNOSIS — I10 Essential (primary) hypertension: Secondary | ICD-10-CM | POA: Insufficient documentation

## 2020-10-20 DIAGNOSIS — R519 Headache, unspecified: Secondary | ICD-10-CM | POA: Insufficient documentation

## 2020-10-20 DIAGNOSIS — F17210 Nicotine dependence, cigarettes, uncomplicated: Secondary | ICD-10-CM | POA: Insufficient documentation

## 2020-10-20 LAB — COMPREHENSIVE METABOLIC PANEL
ALT: 18 U/L (ref 0–44)
AST: 21 U/L (ref 15–41)
Albumin: 4.1 g/dL (ref 3.5–5.0)
Alkaline Phosphatase: 56 U/L (ref 38–126)
Anion gap: 9 (ref 5–15)
BUN: 10 mg/dL (ref 6–20)
CO2: 27 mmol/L (ref 22–32)
Calcium: 9.6 mg/dL (ref 8.9–10.3)
Chloride: 103 mmol/L (ref 98–111)
Creatinine, Ser: 0.79 mg/dL (ref 0.61–1.24)
GFR, Estimated: >60 mL/min (ref >60)
Glucose, Bld: 92 mg/dL (ref 70–99)
Potassium: 4.5 mmol/L (ref 3.5–5.1)
Sodium: 139 mmol/L (ref 135–145)
Total Bilirubin: 0.8 mg/dL (ref 0.3–1.2)
Total Protein: 7.5 g/dL (ref 6.5–8.1)

## 2020-10-20 LAB — CBC WITH DIFFERENTIAL/PLATELET
Abs Immature Granulocytes: 0.02 10*3/uL (ref 0.00–0.07)
Basophils Absolute: 0 10*3/uL (ref 0.0–0.1)
Basophils Relative: 0 %
Eosinophils Absolute: 0.1 10*3/uL (ref 0.0–0.5)
Eosinophils Relative: 1 %
HCT: 43.6 % (ref 39.0–52.0)
Hemoglobin: 15 g/dL (ref 13.0–17.0)
Immature Granulocytes: 0 %
Lymphocytes Relative: 31 %
Lymphs Abs: 3 10*3/uL (ref 0.7–4.0)
MCH: 32.5 pg (ref 26.0–34.0)
MCHC: 34.4 g/dL (ref 30.0–36.0)
MCV: 94.4 fL (ref 80.0–100.0)
Monocytes Absolute: 0.7 10*3/uL (ref 0.1–1.0)
Monocytes Relative: 7 %
Neutro Abs: 5.7 10*3/uL (ref 1.7–7.7)
Neutrophils Relative %: 61 %
Platelets: 181 10*3/uL (ref 150–400)
RBC: 4.62 MIL/uL (ref 4.22–5.81)
RDW: 13.7 % (ref 11.5–15.5)
WBC: 9.5 10*3/uL (ref 4.0–10.5)
nRBC: 0 % (ref 0.0–0.2)

## 2020-10-20 MED ORDER — SODIUM CHLORIDE 0.9 % IV BOLUS: 500.0000 mL | Freq: Once | INTRAVENOUS | Status: DC

## 2020-10-20 MED ORDER — SODIUM CHLORIDE 0.9 % IV SOLN: INTRAVENOUS | Status: DC

## 2020-10-20 NOTE — Discharge Instructions (Signed)
Recommend to start recording your blood pressure daily using your mother's blood pressure machine. Return for any new or worse symptoms.  Today's work-up without any acute findings.  Blood pressure little bit borderline.  So not starting any medications at this point in time.  Follow-up either with [facility] is a possibility to obtain a new primary care doctor.  And/or give the [facility] a call for follow-up.

## 2020-10-20 NOTE — ED Provider Notes (Signed)
[name] Provider Note   CSN: [id number] Arrival date & time: 10/20/20  3016     History Chief Complaint  Patient presents with  . Fatigue    Alejandro Lopez is a 39 y.o. male.  Patient's mother recently had a stroke.  He is concerned about elevated blood pressure.  Patient does not have a known history of hypertension.  And currently not treated.  This morning at about 8 AM he had generalized fatigue with a occipital headache that was 4 out of 10 so not severe.  Some may be some blurred vision.  No speech problems no numbness no weakness.  Patient states there is been a lot of stress because his mother just had a stroke.  Patient reports a history of migraines but when you did get in to that he had headaches as a child did not have any headaches during his 50s or since that time.  So does not have a real history of migraines.  No nausea or vomiting.  Blood pressure on presentation was 171/102.  No fever.  Heart rate 96.  Patient denies any shortness of breath or chest pain.  Denies any weakness or numbness.        Past Medical History:  Diagnosis Date  . Asthma     There are no problems to display for this patient.   Past Surgical History:  Procedure Laterality Date  . FRACTURE SURGERY         Family History  Problem Relation Age of Onset  . Stroke Mother   . Hypertension Mother   . Diabetes Mother     Social History   Tobacco Use  . Smoking status: Current Every Day Smoker    Packs/day: 1.00    Types: Cigarettes  . Smokeless tobacco: Never Used  Vaping Use  . Vaping Use: Never used  Substance Use Topics  . Alcohol use: Yes    Alcohol/week: 4.0 standard drinks    Types: 4 Shots of liquor per week    Comment: every day   . Drug use: No    Home Medications Prior to Admission medications   Medication Sig Start Date End Date Taking? Authorizing Provider  albuterol (PROVENTIL HFA;VENTOLIN HFA) 108 (90 Base) MCG/ACT inhaler Inhale  2 puffs into the lungs every 4 (four) hours as needed for wheezing or shortness of breath. Patient not taking: Reported on 10/20/2020 09/18/18   Samuel Jester, DO  azithromycin (ZITHROMAX Z-PAK) 250 MG tablet 2 po day one, then 1 daily x 4 days 10/19/18   Zadie Rhine, MD  lidocaine (LIDODERM) 5 % Place 1 patch onto the skin daily. Remove & Discard patch within 12 hours or as directed by MD 01/17/20   Henderly, Britni A, PA-C    Allergies    Patient has no known allergies.  Review of Systems   Review of Systems  Constitutional: Positive for fatigue. Negative for chills and fever.  HENT: Negative for congestion, rhinorrhea and sore throat.   Eyes: Negative for visual disturbance.  Respiratory: Negative for cough and shortness of breath.   Cardiovascular: Negative for chest pain and leg swelling.  Gastrointestinal: Negative for abdominal pain, diarrhea, nausea and vomiting.  Genitourinary: Negative for dysuria.  Musculoskeletal: Negative for back pain and neck pain.  Skin: Negative for rash.  Neurological: Positive for headaches. Negative for dizziness, seizures, syncope, facial asymmetry, speech difficulty, weakness, light-headedness and numbness.  Hematological: Does not bruise/bleed easily.  Psychiatric/Behavioral: Negative for confusion.  Physical Exam Updated Vital Signs BP (!) 171/102 (BP Location: Left Arm)   Pulse 96   Temp 98.6 F (37 C) (Oral)   Resp 15   Ht 1.753 m (5\' 9" )   Wt 74.8 kg   SpO2 97%   BMI 24.37 kg/m   Physical Exam Vitals and nursing note reviewed.  Constitutional:      Appearance: Normal appearance. He is well-developed.  HENT:     Head: Normocephalic and atraumatic.  Eyes:     Extraocular Movements: Extraocular movements intact.     Conjunctiva/sclera: Conjunctivae normal.     Pupils: Pupils are equal, round, and reactive to light.  Cardiovascular:     Rate and Rhythm: Normal rate and regular rhythm.     Heart sounds: No murmur heard.    Pulmonary:     Effort: Pulmonary effort is normal. No respiratory distress.     Breath sounds: Normal breath sounds.  Abdominal:     Palpations: Abdomen is soft.     Tenderness: There is no abdominal tenderness.  Musculoskeletal:        General: Normal range of motion.     Cervical back: Normal range of motion and neck supple.  Skin:    General: Skin is warm and dry.  Neurological:     General: No focal deficit present.     Mental Status: He is alert and oriented to person, place, and time.     Cranial Nerves: No cranial nerve deficit.     Sensory: No sensory deficit.     Motor: No weakness.     ED Results / Procedures / Treatments   Labs (all labs ordered are listed, but only abnormal results are displayed) Labs Reviewed  COMPREHENSIVE METABOLIC PANEL  CBC WITH DIFFERENTIAL/PLATELET    EKG EKG Interpretation  Date/Time:  Thursday October 20 2020 09:41:53 EDT Ventricular Rate:  101 PR Interval:    QRS Duration: 89 QT Interval:  368 QTC Calculation: 477 R Axis:   61 Text Interpretation: Sinus tachycardia Borderline prolonged QT interval No significant change since last tracing Confirmed by 11[birth year]13[birth year]1982 [phone]) on 10/20/2020 10:31:04 AM   Radiology DG Chest 2 View  Result Date: 10/20/2020 CLINICAL DATA:  Hypertension EXAM: CHEST - 2 VIEW COMPARISON:  09/18/2018 chest radiograph and prior. FINDINGS: The heart size and mediastinal contours are within normal limits. No focal consolidation, pneumothorax or pleural effusion. No acute osseous abnormality. IMPRESSION: No focal airspace disease. Electronically Signed   By: 09/20/2018 M.D.   On: 10/20/2020 12:04   CT Head Wo Contrast  Result Date: 10/20/2020 CLINICAL DATA:  Headache, hypertension EXAM: CT HEAD WITHOUT CONTRAST TECHNIQUE: Contiguous axial images were obtained from the base of the skull through the vertex without intravenous contrast. COMPARISON:  2006 FINDINGS: Brain: There is no acute  intracranial hemorrhage, mass effect, or edema. Gray-white differentiation is preserved. There is no extra-axial fluid collection. Ventricles and sulci are within normal limits in size and configuration. Vascular: No hyperdense vessel or unexpected calcification. Skull: Calvarium is unremarkable. Sinuses/Orbits: No acute finding. Other: None. IMPRESSION: No acute intracranial abnormality. Electronically Signed   By: 2007 M.D.   On: 10/20/2020 12:08    Procedures Procedures (including critical care time)  Medications Ordered in ED Medications  0.9 %  sodium chloride infusion ( Intravenous Refused 10/20/20 1212)  sodium chloride 0.9 % bolus 500 mL (500 mLs Intravenous Refused 10/20/20 1212)    ED Course  I have reviewed the triage vital signs and  the nursing notes.  Pertinent labs & imaging results that were available during my care of the patient were reviewed by me and considered in my medical decision making (see chart for details).    MDM Rules/Calculators/A&P                          After initial elevated blood pressure reading blood pressures corrected in cells without intervention.  Now kind of borderline with systolics around 140.  Diastolics around 90.  We will have patient keep a daily log.  Give him some referral information for follow-up.  Labs here today without any acute findings.  Chest x-ray without any acute findings and head CT without any acute findings.  Patient without any significant endorgan disease.  Patient's headache is actually fairly mild is not severe.  Patient does not sound as if he is got a history of migraines.  Did sound like he had frequent headaches as a child.  But none in his 55s or since then.  Clinically no concern for stroke.  Head CT showed no evidence of bleed.        Final Clinical Impression(s) / ED Diagnoses Final diagnoses:  Primary hypertension  Acute nonintractable headache, unspecified headache type    Rx / DC Orders ED  Discharge Orders    None       Vanetta Mulders, MD 10/20/20 1319

## 2020-10-20 NOTE — Clinical Social Work Note (Signed)
Transition of Care [facility]) - Emergency Department Mini Assessment  Patient Details  Name: Alejandro Lopez MRN: [id number] Date of Birth: 06/13/1981  Transition of Care [facility]) CM/SW Contact:    Ewing Schlein, LCSW Phone Number: 10/20/2020, 10:18 AM  Clinical Narrative: Patient is a 39 year old male who presented to the ED for high BP. Per chart review, patient does not have a PCP or insurance. CSW spoke with patient to discuss referrals to the financial counselor to screen for Medicaid or other financial assistance programs and [facility] to assist with PCP assistance. Patient agreeable to both referrals. CSW made referral to financial counselor, Jerene Dilling. CSW called [facility] to make referral. TOC signing off.  ED Mini Assessment: What brought you to the Emergency Department? : High BP Barriers to Discharge: ED PCP establishment, Inadequate or no insurance, ED Barriers Resolved Barrier interventions: Referrals to [facility] and financial counselor Means of departure: Car Interventions which prevented an admission or readmission: Other (must enter comment) (Referrals to [facility] and financial counselor)  Patient Contact and Communications Key Contact 1: Financial counselor Jerene Dilling) Key Contact 2: [facility] Contact Date: 10/20/20     Call outcome: Referrals made Choice offered to / list presented to : Patient  Admission diagnosis:  BP HIGH There are no problems to display for this patient.  PCP:  Patient, No Pcp Per Pharmacy:   [id number] - [place], [place] - [address] [place] [address] Phone: [phone] Fax: [phone]

## 2020-10-20 NOTE — ED Triage Notes (Signed)
Patient c/o generalized fatigue with headache and hypertension that started 8am. Per patient B/P 176/91. Denies any slurred speech, facial drooping, dizziness, or weakness on a certain side. Patient states that under a lot stress, mother just had stroke. Patient also reports hx of migraine headache. Patient denies any photo sensitivity, nausea, or vomiting. Per patient some blurred vision.

## 2021-01-22 ENCOUNTER — Emergency Department (HOSPITAL_COMMUNITY)
Admission: EM | Admit: 2021-01-22 | Discharge: 2021-01-22 | Disposition: A | Discharge status: Home / Self Care | Payer: HRSA Program | Attending: Emergency Medicine | Admitting: Emergency Medicine

## 2021-01-22 ENCOUNTER — Other Ambulatory Visit: Payer: Self-pay

## 2021-01-22 DIAGNOSIS — R109 Unspecified abdominal pain: Secondary | ICD-10-CM | POA: Diagnosis not present

## 2021-01-22 DIAGNOSIS — J45909 Unspecified asthma, uncomplicated: Secondary | ICD-10-CM | POA: Insufficient documentation

## 2021-01-22 DIAGNOSIS — Z20822 Contact with and (suspected) exposure to covid-19: Secondary | ICD-10-CM

## 2021-01-22 DIAGNOSIS — F17210 Nicotine dependence, cigarettes, uncomplicated: Secondary | ICD-10-CM | POA: Insufficient documentation

## 2021-01-22 DIAGNOSIS — R059 Cough, unspecified: Secondary | ICD-10-CM | POA: Diagnosis present

## 2021-01-22 DIAGNOSIS — U071 COVID-19: Secondary | ICD-10-CM | POA: Diagnosis not present

## 2021-01-22 LAB — CBC WITH DIFFERENTIAL/PLATELET
Abs Immature Granulocytes: 0.01 10*3/uL (ref 0.00–0.07)
Basophils Absolute: 0 10*3/uL (ref 0.0–0.1)
Basophils Relative: 0 %
Eosinophils Absolute: 0.2 10*3/uL (ref 0.0–0.5)
Eosinophils Relative: 2 %
HCT: 44.2 % (ref 39.0–52.0)
Hemoglobin: 14.9 g/dL (ref 13.0–17.0)
Immature Granulocytes: 0 %
Lymphocytes Relative: 31 %
Lymphs Abs: 2.6 10*3/uL (ref 0.7–4.0)
MCH: 31.8 pg (ref 26.0–34.0)
MCHC: 33.7 g/dL (ref 30.0–36.0)
MCV: 94.4 fL (ref 80.0–100.0)
Monocytes Absolute: 0.6 10*3/uL (ref 0.1–1.0)
Monocytes Relative: 7 %
Neutro Abs: 4.9 10*3/uL (ref 1.7–7.7)
Neutrophils Relative %: 60 %
Platelets: 175 10*3/uL (ref 150–400)
RBC: 4.68 MIL/uL (ref 4.22–5.81)
RDW: 13.6 % (ref 11.5–15.5)
WBC: 8.3 10*3/uL (ref 4.0–10.5)
nRBC: 0 % (ref 0.0–0.2)

## 2021-01-22 LAB — COMPREHENSIVE METABOLIC PANEL
ALT: 21 U/L (ref 0–44)
AST: 22 U/L (ref 15–41)
Albumin: 3.9 g/dL (ref 3.5–5.0)
Alkaline Phosphatase: 58 U/L (ref 38–126)
Anion gap: 5 (ref 5–15)
BUN: 11 mg/dL (ref 6–20)
CO2: 25 mmol/L (ref 22–32)
Calcium: 9.3 mg/dL (ref 8.9–10.3)
Chloride: 107 mmol/L (ref 98–111)
Creatinine, Ser: 0.75 mg/dL (ref 0.61–1.24)
GFR, Estimated: >60 mL/min (ref >60)
Glucose, Bld: 100 mg/dL — ABNORMAL HIGH (ref 70–99)
Potassium: 3.9 mmol/L (ref 3.5–5.1)
Sodium: 137 mmol/L (ref 135–145)
Total Bilirubin: 0.7 mg/dL (ref 0.3–1.2)
Total Protein: 7.1 g/dL (ref 6.5–8.1)

## 2021-01-22 LAB — POC SARS CORONAVIRUS 2 AG -  ED: SARS Coronavirus 2 Ag: NEGATIVE

## 2021-01-22 LAB — LIPASE, BLOOD: Lipase: 25 U/L (ref 11–51)

## 2021-01-22 MED ORDER — BENZONATATE 100 MG PO CAPS: 100.0000 mg | ORAL_CAPSULE | Freq: Three times a day (TID) | ORAL | 0 refills | Status: AC

## 2021-01-22 NOTE — ED Provider Notes (Addendum)
[facility] EMERGENCY DEPARTMENT Provider Note   CSN: [id number] Arrival date & time: 01/22/21  1950     History Chief Complaint  Patient presents with  . Diarrhea  . Covid Exposure    Alejandro Lopez is a 40 y.o. male.  HPI   40 year old male with a history of asthma, presents to the emergency department today for evaluation of Covid symptoms.  States he was exposed to his mother who was recently diagnosed with Covid.  For the last week he has had a cough, diarrhea, body aches, sweats, chills, fatigue.  States he has some abdominal pain when he has episodes of diarrhea.  It is located diffusely.  Does not report any chest pain or other symptoms. Has not been vaccinated against covid.  Past Medical History:  Diagnosis Date  . Asthma     There are no problems to display for this patient.   Past Surgical History:  Procedure Laterality Date  . FRACTURE SURGERY         Family History  Problem Relation Age of Onset  . Stroke Mother   . Hypertension Mother   . Diabetes Mother     Social History   Tobacco Use  . Smoking status: Current Every Day Smoker    Packs/day: 1.00    Types: Cigarettes  . Smokeless tobacco: Never Used  Vaping Use  . Vaping Use: Never used  Substance Use Topics  . Alcohol use: Yes    Alcohol/week: 4.0 standard drinks    Types: 4 Shots of liquor per week    Comment: every day   . Drug use: No    Home Medications Prior to Admission medications   Medication Sig Start Date End Date Taking? Authorizing Provider  benzonatate (TESSALON) 100 MG capsule Take 1 capsule (100 mg total) by mouth every 8 (eight) hours for 5 days. 01/22/21 01/27/21 Yes France Lusty S, PA-C  albuterol (PROVENTIL HFA;VENTOLIN HFA) 108 (90 Base) MCG/ACT inhaler Inhale 2 puffs into the lungs every 4 (four) hours as needed for wheezing or shortness of breath. Patient not taking: Reported on 10/20/2020 09/18/18   Samuel Jester, DO  azithromycin (ZITHROMAX Z-PAK) 250 MG  tablet 2 po day one, then 1 daily x 4 days 10/19/18   Zadie Rhine, MD  lidocaine (LIDODERM) 5 % Place 1 patch onto the skin daily. Remove & Discard patch within 12 hours or as directed by MD 01/17/20   Henderly, Britni A, PA-C    Allergies    Patient has no known allergies.  Review of Systems   Review of Systems  Constitutional: Positive for chills, diaphoresis and fatigue.  HENT: Negative for ear pain and sore throat.   Eyes: Negative for visual disturbance.  Respiratory: Positive for cough and shortness of breath.   Cardiovascular: Negative for chest pain.  Gastrointestinal: Positive for abdominal pain, diarrhea and nausea. Negative for constipation and vomiting.  Genitourinary: Negative for dysuria and hematuria.  Musculoskeletal: Negative for back pain.  Skin: Negative for rash.  Neurological: Negative for seizures and syncope.  All other systems reviewed and are negative.   Physical Exam Updated Vital Signs BP (!) 147/87 (BP Location: Right Arm)   Pulse 88   Temp 98 F (36.7 C) (Oral)   Resp 18   Ht 5\' 9"  (1.753 m)   Wt 74.8 kg   SpO2 98%   BMI 24.37 kg/m   Physical Exam Vitals and nursing note reviewed.  Constitutional:      Appearance: He is  well-developed and well-nourished.  HENT:     Head: Normocephalic and atraumatic.  Eyes:     Conjunctiva/sclera: Conjunctivae normal.  Cardiovascular:     Rate and Rhythm: Normal rate and regular rhythm.     Heart sounds: Normal heart sounds. No murmur heard.   Pulmonary:     Effort: Pulmonary effort is normal. No respiratory distress.     Breath sounds: Normal breath sounds. No wheezing, rhonchi or rales.  Abdominal:     General: Bowel sounds are normal.     Palpations: Abdomen is soft.     Tenderness: There is no abdominal tenderness. There is no guarding or rebound.  Musculoskeletal:        General: No edema.     Cervical back: Neck supple.  Skin:    General: Skin is warm and dry.  Neurological:      Mental Status: He is alert.  Psychiatric:        Mood and Affect: Mood and affect normal.     ED Results / Procedures / Treatments   Labs (all labs ordered are listed, but only abnormal results are displayed) Labs Reviewed  COMPREHENSIVE METABOLIC PANEL - Abnormal; Notable for the following components:      Result Value   Glucose, Bld 100 (*)    All other components within normal limits  SARS CORONAVIRUS 2 (TAT 6-24 HRS)  CBC WITH DIFFERENTIAL/PLATELET  LIPASE, BLOOD  POC SARS CORONAVIRUS 2 AG -  ED    EKG None  Radiology No results found.  Procedures Procedures   Medications Ordered in ED Medications - No data to display  ED Course  I have reviewed the triage vital signs and the nursing notes.  Pertinent labs & imaging results that were available during my care of the patient were reviewed by me and considered in my medical decision making (see chart for details).    MDM Rules/Calculators/A&P                          Patient presenting for evaluation for Covid.  Reports symptoms ongoing for 7 days.  Patient nontoxic, well-appearing, no distress.  Vital signs are reassuring. Was c/o abd pain and diarrhea. abd is soft and nontender. Doubt emergent intraabdominal process. Labs wnl.  Tested for Covid in the ED. Results of poc test neg, pcr test pending at discharge. Advised on quarantine measures. Will give Rx for symptomatic management. Advised on f/u and return precautions. Pt voiced understanding of the plan and reasons to return. All questions answered, pt stable for d/c.  Alejandro Lopez was evaluated in Emergency Department on 01/22/2021 for the symptoms described in the history of present illness. He was evaluated in the context of the global COVID-19 pandemic, which necessitated consideration that the patient might be at risk for infection with the SARS-CoV-2 virus that causes COVID-19. Institutional protocols and algorithms that pertain to the evaluation of patients  at risk for COVID-19 are in a state of rapid change based on information released by regulatory bodies including the CDC and federal and state organizations. These policies and algorithms were followed during the patient's care in the ED.   Final Clinical Impression(s) / ED Diagnoses Final diagnoses:  Suspected COVID-19 virus infection    Rx / DC Orders ED Discharge Orders         Ordered    benzonatate (TESSALON) 100 MG capsule  Every 8 hours        01/22/21  [address] PA-C 01/22/21 1128    Karrie Meres, PA-C 01/22/21 1129    Terrilee Files, MD 01/22/21 [phone]

## 2021-01-22 NOTE — ED Triage Notes (Signed)
Pt c/o of abdominal pain with one episode of diarrhea, burping, cough x 3 days

## 2021-01-22 NOTE — Discharge Instructions (Signed)
Your Covid test is pending at the time of discharge.  You will need to make a MyChart on the [facility] website to follow-up on your result.  It should be available within the next 24 hours.    Given your current symptoms and known Covid exposure, you should be quarantining.  If your Covid test is positive, You should be isolated for at least 5 days since the onset of your symptoms AND >72 hours after symptoms resolution (absence of fever without the use of fever reducing medicaiton and improvement in respiratory symptoms), whichever is longer   Please follow up with your primary care provider within 5-7 days for re-evaluation of your symptoms. If you do not have a primary care provider, information for a healthcare clinic has been provided for you to make arrangements for follow up care. Please return to the emergency department for any new or worsening symptoms.

## 2021-01-23 LAB — SARS CORONAVIRUS 2 (TAT 6-24 HRS): SARS Coronavirus 2: POSITIVE — AB

## 2021-01-25 ENCOUNTER — Telehealth: Payer: Self-pay | Admitting: Nurse Practitioner

## 2021-01-25 NOTE — Telephone Encounter (Signed)
[facility] [phone]) called pt for HFU. VM BOX FULL unable to leave message for return call to schedule HFU appt.

## 2021-09-07 ENCOUNTER — Emergency Department (HOSPITAL_COMMUNITY)
Admission: EM | Admit: 2021-09-07 | Discharge: 2021-09-07 | Disposition: A | Discharge status: Home / Self Care | Payer: Self-pay | Attending: Emergency Medicine | Admitting: Emergency Medicine

## 2021-09-07 ENCOUNTER — Other Ambulatory Visit: Payer: Self-pay

## 2021-09-07 ENCOUNTER — Encounter (HOSPITAL_COMMUNITY): Payer: Self-pay

## 2021-09-07 ENCOUNTER — Emergency Department (HOSPITAL_COMMUNITY): Payer: Self-pay

## 2021-09-07 DIAGNOSIS — Z720 Tobacco use: Secondary | ICD-10-CM

## 2021-09-07 DIAGNOSIS — J069 Acute upper respiratory infection, unspecified: Secondary | ICD-10-CM | POA: Insufficient documentation

## 2021-09-07 DIAGNOSIS — Z20822 Contact with and (suspected) exposure to covid-19: Secondary | ICD-10-CM | POA: Insufficient documentation

## 2021-09-07 DIAGNOSIS — F17210 Nicotine dependence, cigarettes, uncomplicated: Secondary | ICD-10-CM | POA: Insufficient documentation

## 2021-09-07 DIAGNOSIS — R000 Tachycardia, unspecified: Secondary | ICD-10-CM | POA: Insufficient documentation

## 2021-09-07 DIAGNOSIS — R0781 Pleurodynia: Secondary | ICD-10-CM | POA: Insufficient documentation

## 2021-09-07 DIAGNOSIS — J45909 Unspecified asthma, uncomplicated: Secondary | ICD-10-CM | POA: Insufficient documentation

## 2021-09-07 LAB — GROUP A STREP BY PCR: Group A Strep by PCR: NOT DETECTED

## 2021-09-07 LAB — RESP PANEL BY RT-PCR (FLU A&B, COVID) ARPGX2
Influenza A by PCR: NEGATIVE
Influenza B by PCR: NEGATIVE
SARS Coronavirus 2 by RT PCR: NEGATIVE

## 2021-09-07 MED ORDER — ALBUTEROL SULFATE HFA 108 (90 BASE) MCG/ACT IN AERS
2.0000 | INHALATION_SPRAY | RESPIRATORY_TRACT | Status: DC | PRN
  Filled 2021-09-07: qty 6.7

## 2021-09-07 MED ORDER — AEROCHAMBER Z-STAT PLUS/MEDIUM MISC: 1.0000 | Freq: Once | Status: DC

## 2021-09-07 MED ORDER — IBUPROFEN 600 MG PO TABS: 600.0000 mg | ORAL_TABLET | Freq: Four times a day (QID) | ORAL | 0 refills | Status: DC | PRN

## 2021-09-07 MED ORDER — PREDNISONE 10 MG (21) PO TBPK: ORAL_TABLET | Freq: Every day | ORAL | 0 refills | Status: DC

## 2021-09-07 MED ORDER — DEXAMETHASONE SODIUM PHOSPHATE 10 MG/ML IJ SOLN
10.0000 mg | Freq: Once | INTRAMUSCULAR | Status: AC
  Administered 2021-09-07: 10 mg via INTRAMUSCULAR
  Filled 2021-09-07: qty 1

## 2021-09-07 MED ORDER — KETOROLAC TROMETHAMINE 30 MG/ML IJ SOLN
30.0000 mg | Freq: Once | INTRAMUSCULAR | Status: AC
  Administered 2021-09-07: 30 mg via INTRAMUSCULAR
  Filled 2021-09-07: qty 1

## 2021-09-07 NOTE — ED Provider Notes (Signed)
[facility] EMERGENCY DEPARTMENT Provider Note   CSN: [id number] Arrival date & time: 09/07/21  0820     History Chief Complaint  Patient presents with   Cough    Alejandro Lopez is Lopez 40 y.o. male.  Pt presents to the ED today with cough, feeling tired, and some pain in his right ribs with coughing.  Pt said sx have been going on for 1 week.  He does smoke.  He is unvaccinated.  He denies any fever.  His son was sick recently with strep.       Past Medical History:  Diagnosis Date   Asthma     There are no problems to display for this patient.   Past Surgical History:  Procedure Laterality Date   FRACTURE SURGERY         Family History  Problem Relation Age of Onset   Stroke Mother    Hypertension Mother    Diabetes Mother     Social History   Tobacco Use   Smoking status: Every Day    Packs/day: 1.00    Types: Cigarettes   Smokeless tobacco: Never  Vaping Use   Vaping Use: Never used  Substance Use Topics   Alcohol use: Yes    Alcohol/week: 4.0 standard drinks    Types: 4 Shots of liquor per week    Comment: every day    Drug use: No    Home Medications Prior to Admission medications   Medication Sig Start Date End Date Taking? Authorizing Provider  ibuprofen (ADVIL) 600 MG tablet Take 1 tablet (600 mg total) by mouth every 6 (six) hours as needed. 09/07/21  Yes Alejandro Lefevre, Alejandro Lopez  predniSONE (STERAPRED UNI-PAK 21 TAB) 10 MG (21) TBPK tablet Take by mouth daily. Take 6 tabs by mouth daily  for 2 days, then 5 tabs for 2 days, then 4 tabs for 2 days, then 3 tabs for 2 days, 2 tabs for 2 days, then 1 tab by mouth daily for 2 days 09/07/21  Yes Alejandro Lefevre, Alejandro Lopez  albuterol (PROVENTIL HFA;VENTOLIN HFA) 108 (90 Base) MCG/ACT inhaler Inhale 2 puffs into the lungs every 4 (four) hours as needed for wheezing or shortness of breath. Patient not taking: Reported on 10/20/2020 09/18/18   Alejandro Jester, Alejandro Lopez  azithromycin (ZITHROMAX Z-PAK) 250 MG tablet 2 po  day one, then 1 daily x 4 days 10/19/18   Alejandro Rhine, Alejandro Lopez  lidocaine (LIDODERM) 5 % Place 1 patch onto the skin daily. Remove & Discard patch within 12 hours or as directed by Alejandro Lopez 01/17/20   Henderly, Britni Lopez, Alejandro Lopez    Allergies    Patient has no known allergies.  Review of Systems   Review of Systems  Constitutional:  Positive for fatigue.  Respiratory:  Positive for cough and shortness of breath.   All other systems reviewed and are negative.  Physical Exam Updated Vital Signs BP (!) 141/98   Pulse 100   Temp 98.6 F (37 C) (Oral)   Resp 16   Ht 5\' 10"  (1.778 m)   Wt 74.8 kg   SpO2 99%   BMI 23.68 kg/m   Physical Exam Vitals and nursing note reviewed.  Constitutional:      Appearance: Normal appearance.  HENT:     Head: Normocephalic and atraumatic.     Right Ear: External ear normal.     Left Ear: External ear normal.     Nose: Nose normal.     Mouth/Throat:  Mouth: Mucous membranes are moist.     Pharynx: Oropharynx is clear.  Eyes:     Extraocular Movements: Extraocular movements intact.     Conjunctiva/sclera: Conjunctivae normal.     Pupils: Pupils are equal, round, and reactive to light.  Cardiovascular:     Rate and Rhythm: Regular rhythm. Tachycardia present.     Pulses: Normal pulses.     Heart sounds: Normal heart sounds.  Pulmonary:     Effort: Pulmonary effort is normal.     Breath sounds: Wheezing present.  Abdominal:     General: Abdomen is flat. Bowel sounds are normal.     Palpations: Abdomen is soft.  Musculoskeletal:        General: Normal range of motion.     Cervical back: Normal range of motion and neck supple.  Skin:    General: Skin is warm.     Capillary Refill: Capillary refill takes less than 2 seconds.  Neurological:     General: No focal deficit present.     Mental Status: He is alert and oriented to person, place, and time.  Psychiatric:        Mood and Affect: Mood normal.        Behavior: Behavior normal.         Thought Content: Thought content normal.    ED Results / Procedures / Treatments   Labs (all labs ordered are listed, but only abnormal results are displayed) Labs Reviewed  RESP PANEL BY RT-PCR (FLU Lopez&B, COVID) [id number]  GROUP Lopez STREP BY PCR    EKG None  Radiology DG Chest Portable 1 View  Result Date: 09/07/2021 CLINICAL DATA:  Cough for the past week EXAM: PORTABLE CHEST 1 VIEW COMPARISON:  10/20/2020 FINDINGS: Normal heart size and mediastinal contours. No acute infiltrate or edema. No effusion or pneumothorax. No acute osseous findings. IMPRESSION: Negative chest. Electronically Signed   By: Alejandro Lopez M.D.   On: 09/07/2021 09:40    Procedures Procedures   Medications Ordered in ED Medications  albuterol (VENTOLIN HFA) 108 (90 Base) MCG/ACT inhaler 2 puff (has no administration in time range)  aerochamber Z-Stat Plus/medium 1 each (1 each Other Patient Refused/Not Given 09/07/21 0959)  ketorolac (TORADOL) 30 MG/ML injection 30 mg (30 mg Intramuscular Given 09/07/21 0920)  dexamethasone (DECADRON) injection 10 mg (10 mg Intramuscular Given 09/07/21 2951)    ED Course  I have reviewed the triage vital signs and the nursing notes.  Pertinent labs & imaging results that were available during my care of the patient were reviewed by me and considered in my medical decision making (see chart for details).    MDM Rules/Calculators/Lopez&P                           Pt is feeling much better.  He is negative for Covid.  Pt d/c with prednisone and an albuterol inhaler.  He is encouraged to stop smoking.  Alejandro Lopez was evaluated in Emergency Department on 09/07/2021 for the symptoms described in the history of present illness. He was evaluated in the context of the global COVID-19 pandemic, which necessitated consideration that the patient might be at risk for infection with the SARS-CoV-2 virus that causes COVID-19. Institutional protocols and algorithms that pertain to the  evaluation of patients at risk for COVID-19 are in Lopez state of rapid change based on information released by regulatory bodies including the CDC and federal and state organizations. These  policies and algorithms were followed during the patient's care in the ED.  Final Clinical Impression(s) / ED Diagnoses Final diagnoses:  Viral URI with cough  Tobacco abuse    Rx / DC Orders ED Discharge Orders          Ordered    predniSONE (STERAPRED UNI-PAK 21 TAB) 10 MG (21) TBPK tablet  Daily        09/07/21 1049    ibuprofen (ADVIL) 600 MG tablet  Every 6 hours PRN        09/07/21 1049             Alejandro Lefevre, Alejandro Lopez 09/07/21 1053

## 2021-09-07 NOTE — ED Triage Notes (Signed)
Pt ambulatory to er room number 8, pt c/o cough for the past week, pt has yellow sputum, states that he is here today because he started having some pains in his R ribs/chest when he coughs.

## 2021-09-07 NOTE — Discharge Instructions (Signed)
Try to stop smoking. °

## 2022-11-05 ENCOUNTER — Encounter (HOSPITAL_COMMUNITY): Payer: Self-pay

## 2022-11-05 ENCOUNTER — Emergency Department (HOSPITAL_COMMUNITY)
Admission: EM | Admit: 2022-11-05 | Discharge: 2022-11-06 | Discharge status: Against Medical Advice | Payer: Self-pay | Attending: Emergency Medicine | Admitting: Emergency Medicine

## 2022-11-05 ENCOUNTER — Other Ambulatory Visit: Payer: Self-pay

## 2022-11-05 ENCOUNTER — Emergency Department (HOSPITAL_COMMUNITY): Payer: Self-pay

## 2022-11-05 DIAGNOSIS — I959 Hypotension, unspecified: Secondary | ICD-10-CM | POA: Diagnosis not present

## 2022-11-05 DIAGNOSIS — M545 Low back pain, unspecified: Secondary | ICD-10-CM | POA: Insufficient documentation

## 2022-11-05 DIAGNOSIS — Z5321 Procedure and treatment not carried out due to patient leaving prior to being seen by health care provider: Secondary | ICD-10-CM | POA: Insufficient documentation

## 2022-11-05 DIAGNOSIS — T68XXXA Hypothermia, initial encounter: Secondary | ICD-10-CM | POA: Diagnosis not present

## 2022-11-05 MED ORDER — ACETAMINOPHEN 500 MG PO TABS
1000.0000 mg | ORAL_TABLET | Freq: Once | ORAL | Status: AC
  Administered 2022-11-05: 1000 mg via ORAL
  Filled 2022-11-05: qty 2
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
## 2022-11-07 ENCOUNTER — Emergency Department (HOSPITAL_COMMUNITY): Payer: 59

## 2022-11-07 ENCOUNTER — Other Ambulatory Visit: Payer: Self-pay

## 2022-11-07 ENCOUNTER — Emergency Department (HOSPITAL_COMMUNITY)
Admission: EM | Admit: 2022-11-07 | Discharge: 2022-11-07 | Disposition: A | Discharge status: Home / Self Care | Payer: 59 | Attending: Emergency Medicine | Admitting: Emergency Medicine

## 2022-11-07 ENCOUNTER — Encounter (HOSPITAL_COMMUNITY): Payer: Self-pay | Admitting: Emergency Medicine

## 2022-11-07 DIAGNOSIS — Y92410 Unspecified street and highway as the place of occurrence of the external cause: Secondary | ICD-10-CM | POA: Insufficient documentation

## 2022-11-07 DIAGNOSIS — M545 Low back pain, unspecified: Secondary | ICD-10-CM | POA: Diagnosis not present

## 2022-11-07 MED ORDER — IBUPROFEN 800 MG PO TABS: 800.0000 mg | ORAL_TABLET | Freq: Three times a day (TID) | ORAL | 0 refills | Status: DC | PRN

## 2022-11-07 NOTE — Discharge Instructions (Signed)
Follow-up with your doctor if not improving 
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
## 2022-11-07 NOTE — ED Notes (Signed)
Pt returned from radiology.

## 2022-11-07 NOTE — ED Notes (Signed)
Patient transported to X-ray 

## 2022-11-07 NOTE — ED Notes (Signed)
Dc instructions reviewed with pt no questions or concerns at this time. Will follow up with pcp as needed.  
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
## 2022-11-07 NOTE — ED Notes (Signed)
EDP at bedside  

## 2023-01-24 ENCOUNTER — Ambulatory Visit: Payer: Self-pay | Admitting: Orthopaedic Surgery

## 2023-02-07 ENCOUNTER — Ambulatory Visit: Payer: Self-pay | Admitting: Orthopaedic Surgery

## 2023-10-11 ENCOUNTER — Ambulatory Visit
Admission: EM | Admit: 2023-10-11 | Discharge: 2023-10-11 | Disposition: A | Discharge status: Home / Self Care | Payer: 59 | Attending: Family Medicine | Admitting: Family Medicine

## 2023-10-11 ENCOUNTER — Encounter: Payer: Self-pay | Admitting: Emergency Medicine

## 2023-10-11 DIAGNOSIS — J4521 Mild intermittent asthma with (acute) exacerbation: Secondary | ICD-10-CM

## 2023-10-11 DIAGNOSIS — J069 Acute upper respiratory infection, unspecified: Secondary | ICD-10-CM

## 2023-10-11 MED ORDER — PREDNISONE 20 MG PO TABS: 40.0000 mg | ORAL_TABLET | Freq: Every day | ORAL | 0 refills | Status: DC

## 2023-10-11 MED ORDER — PROMETHAZINE-DM 6.25-15 MG/5ML PO SYRP: 5.0000 mL | ORAL_SOLUTION | Freq: Four times a day (QID) | ORAL | 0 refills | Status: DC | PRN

## 2023-10-11 MED ORDER — ALBUTEROL SULFATE HFA 108 (90 BASE) MCG/ACT IN AERS: 2.0000 | INHALATION_SPRAY | RESPIRATORY_TRACT | 0 refills | Status: DC | PRN

## 2023-10-11 NOTE — ED Provider Notes (Signed)
[facility]    CSN: [id number] Arrival date & time: 10/11/23  1636      History   Chief Complaint Chief Complaint  Patient presents with   Sore Throat   Cough    HPI Alejandro Lopez is a 42 y.o. male.   Patient presenting today with 4-day history of sore throat, hacking productive cough, chest tightness, wheezing, nasal congestion, headache.  Denies fever, chest pain, shortness of breath, abdominal pain, nausea vomiting or diarrhea.  Trying over-the-counter cold and congestion medication with minimal relief.  History of asthma and does not currently have an inhaler.    Past Medical History:  Diagnosis Date   Asthma     Patient Active Problem List   Diagnosis Date Noted   Low back pain 09/23/2014    Past Surgical History:  Procedure Laterality Date   FRACTURE SURGERY         Home Medications    Prior to Admission medications   Medication Sig Start Date End Date Taking? Authorizing Provider  albuterol (VENTOLIN HFA) 108 (90 Base) MCG/ACT inhaler Inhale 2 puffs into the lungs every 4 (four) hours as needed. 10/11/23  Yes Particia Nearing, PA-C  predniSONE (DELTASONE) 20 MG tablet Take 2 tablets (40 mg total) by mouth daily with breakfast. 10/11/23  Yes Particia Nearing, PA-C  promethazine-dextromethorphan (PROMETHAZINE-DM) 6.25-15 MG/5ML syrup Take 5 mLs by mouth 4 (four) times daily as needed. 10/11/23  Yes Particia Nearing, PA-C  ibuprofen (ADVIL) 800 MG tablet Take 1 tablet (800 mg total) by mouth every 8 (eight) hours as needed for moderate pain. 11/07/22   Bethann Berkshire, MD    Family History Family History  Problem Relation Age of Onset   Stroke Mother    Hypertension Mother    Diabetes Mother     Social History Social History   Tobacco Use   Smoking status: Every Day    Current packs/day: 1.00    Types: Cigarettes   Smokeless tobacco: Never  Vaping Use   Vaping status: Never Used  Substance Use Topics   Alcohol  use: Yes    Alcohol/week: 4.0 standard drinks of alcohol    Types: 4 Shots of liquor per week    Comment: every day    Drug use: No     Allergies   Patient has no known allergies.   Review of Systems Review of Systems Per HPI  Physical Exam Triage Vital Signs ED Triage Vitals  Encounter Vitals Group     BP 10/11/23 1820 (!) 164/89     Systolic BP Percentile --      Diastolic BP Percentile --      Pulse Rate 10/11/23 1820 (!) 112     Resp 10/11/23 1820 18     Temp 10/11/23 1820 98.5 F (36.9 C)     Temp Source 10/11/23 1820 Oral     SpO2 10/11/23 1820 94 %     Weight --      Height --      Head Circumference --      Peak Flow --      Pain Score 10/11/23 1822 4     Pain Loc --      Pain Education --      Exclude from Growth Chart --    No data found.  Updated Vital Signs BP (!) 164/89 (BP Location: Right Arm)   Pulse (!) 112   Temp 98.5 F (36.9 C) (Oral)   Resp  18   SpO2 94%   Visual Acuity Right Eye Distance:   Left Eye Distance:   Bilateral Distance:    Right Eye Near:   Left Eye Near:    Bilateral Near:     Physical Exam Vitals and nursing note reviewed.  Constitutional:      Appearance: He is well-developed.  HENT:     Head: Atraumatic.     Right Ear: External ear normal.     Left Ear: External ear normal.     Nose: Rhinorrhea present.     Mouth/Throat:     Pharynx: Posterior oropharyngeal erythema present. No oropharyngeal exudate.  Eyes:     Conjunctiva/sclera: Conjunctivae normal.     Pupils: Pupils are equal, round, and reactive to light.  Cardiovascular:     Rate and Rhythm: Normal rate and regular rhythm.  Pulmonary:     Effort: Pulmonary effort is normal. No respiratory distress.     Breath sounds: Wheezing present. No rales.  Musculoskeletal:        General: Normal range of motion.     Cervical back: Normal range of motion and neck supple.  Lymphadenopathy:     Cervical: No cervical adenopathy.  Skin:    General: Skin is  warm and dry.  Neurological:     Mental Status: He is alert and oriented to person, place, and time.  Psychiatric:        Behavior: Behavior normal.      UC Treatments / Results  Labs (all labs ordered are listed, but only abnormal results are displayed) Labs Reviewed - No data to display  EKG   Radiology No results found.  Procedures Procedures (including critical care time)  Medications Ordered in UC Medications - No data to display  Initial Impression / Assessment and Plan / UC Course  I have reviewed the triage vital signs and the nursing notes.  Pertinent labs & imaging results that were available during my care of the patient were reviewed by me and considered in my medical decision making (see chart for details).     Mildly tachycardic and hypertensive in triage, otherwise vital signs reassuring.  He is overall well-appearing in no acute distress.  Suspect viral respiratory infection causing asthma exacerbation.  Treat with prednisone, Phenergan DM, albuterol, supportive over-the-counter medications and home care.  Return for worsening symptoms.  Final Clinical Impressions(s) / UC Diagnoses   Final diagnoses:  Viral URI with cough  Mild intermittent asthma with acute exacerbation   Discharge Instructions   None    ED Prescriptions     Medication Sig Dispense Auth. Provider   predniSONE (DELTASONE) 20 MG tablet Take 2 tablets (40 mg total) by mouth daily with breakfast. 10 tablet Particia Nearing, PA-C   promethazine-dextromethorphan (PROMETHAZINE-DM) 6.25-15 MG/5ML syrup Take 5 mLs by mouth 4 (four) times daily as needed. 100 mL Particia Nearing, PA-C   albuterol (VENTOLIN HFA) 108 (90 Base) MCG/ACT inhaler Inhale 2 puffs into the lungs every 4 (four) hours as needed. 18 g Particia Nearing, [place]      PDMP not reviewed this encounter.   Particia Nearing, [place] 10/11/23 1952

## 2023-10-11 NOTE — ED Triage Notes (Signed)
Scratchy sore throat, productive cough, nasal congestion and headache since Tuesday.

## 2023-10-13 ENCOUNTER — Telehealth: Payer: Self-pay | Admitting: Emergency Medicine

## 2023-10-13 MED ORDER — PREDNISONE 20 MG PO TABS: 40.0000 mg | ORAL_TABLET | Freq: Every day | ORAL | 0 refills | Status: DC

## 2023-10-13 MED ORDER — ALBUTEROL SULFATE HFA 108 (90 BASE) MCG/ACT IN AERS: 2.0000 | INHALATION_SPRAY | RESPIRATORY_TRACT | 0 refills | Status: DC | PRN

## 2023-10-13 MED ORDER — PROMETHAZINE-DM 6.25-15 MG/5ML PO SYRP: 5.0000 mL | ORAL_SOLUTION | Freq: Four times a day (QID) | ORAL | 0 refills | Status: DC | PRN

## 2023-10-13 NOTE — Telephone Encounter (Signed)
Pt called and reported pharmacy did not received discharge px. Called pharmacy and they confirmed. Discharged prescriptions reordered and electronically sent. Pt aware.

## 2023-10-14 ENCOUNTER — Telehealth: Payer: Self-pay

## 2023-10-14 MED ORDER — PROMETHAZINE-DM 6.25-15 MG/5ML PO SYRP: 5.0000 mL | ORAL_SOLUTION | Freq: Four times a day (QID) | ORAL | 0 refills | Status: DC | PRN

## 2023-10-14 MED ORDER — ALBUTEROL SULFATE HFA 108 (90 BASE) MCG/ACT IN AERS: 2.0000 | INHALATION_SPRAY | RESPIRATORY_TRACT | 0 refills | Status: DC | PRN

## 2023-10-14 MED ORDER — PREDNISONE 20 MG PO TABS: 40.0000 mg | ORAL_TABLET | Freq: Every day | ORAL | 0 refills | Status: DC

## 2023-10-14 NOTE — Telephone Encounter (Signed)
Pt called stating he has not been able to pick up his Rx from [facility], that the pharmacy staff told they have not received the prescriptions. Pt has requested that we send the prescriptions to [facility] [address] Instead.     Spoke with staff at [facility] on [address] they stated that pt's insurance no longer covers [facility] as a pharmacy. That they electronically sent over all medications yesterday but that they may have gotten lost in transit.    Prescriptions have been sent to [facility] [address]

## 2024-11-22 ENCOUNTER — Encounter (HOSPITAL_COMMUNITY): Payer: Self-pay | Admitting: Emergency Medicine

## 2024-11-22 ENCOUNTER — Other Ambulatory Visit: Payer: Self-pay

## 2024-11-22 ENCOUNTER — Emergency Department (HOSPITAL_COMMUNITY)

## 2024-11-22 ENCOUNTER — Inpatient Hospital Stay (HOSPITAL_COMMUNITY)
Admission: EM | Admit: 2024-11-22 | Discharge: 2024-12-28 | DRG: 001 | Disposition: A | Discharge status: Rehabilitation Facility | Attending: Surgery | Admitting: Surgery

## 2024-11-22 DIAGNOSIS — I351 Nonrheumatic aortic (valve) insufficiency: Secondary | ICD-10-CM | POA: Diagnosis present

## 2024-11-22 DIAGNOSIS — Z9911 Dependence on respirator [ventilator] status: Secondary | ICD-10-CM

## 2024-11-22 DIAGNOSIS — I110 Hypertensive heart disease with heart failure: Principal | ICD-10-CM | POA: Diagnosis present

## 2024-11-22 DIAGNOSIS — F17210 Nicotine dependence, cigarettes, uncomplicated: Secondary | ICD-10-CM | POA: Diagnosis present

## 2024-11-22 DIAGNOSIS — R7989 Other specified abnormal findings of blood chemistry: Secondary | ICD-10-CM | POA: Diagnosis present

## 2024-11-22 DIAGNOSIS — K567 Ileus, unspecified: Secondary | ICD-10-CM | POA: Diagnosis not present

## 2024-11-22 DIAGNOSIS — K761 Chronic passive congestion of liver: Secondary | ICD-10-CM | POA: Diagnosis present

## 2024-11-22 DIAGNOSIS — R100 Acute abdomen: Secondary | ICD-10-CM | POA: Diagnosis not present

## 2024-11-22 DIAGNOSIS — I10 Essential (primary) hypertension: Secondary | ICD-10-CM | POA: Diagnosis present

## 2024-11-22 DIAGNOSIS — T80818A Extravasation of other vesicant agent, initial encounter: Secondary | ICD-10-CM | POA: Diagnosis not present

## 2024-11-22 DIAGNOSIS — D6959 Other secondary thrombocytopenia: Secondary | ICD-10-CM | POA: Diagnosis not present

## 2024-11-22 DIAGNOSIS — Z823 Family history of stroke: Secondary | ICD-10-CM

## 2024-11-22 DIAGNOSIS — Z555 Less than a high school diploma: Secondary | ICD-10-CM

## 2024-11-22 DIAGNOSIS — R6521 Severe sepsis with septic shock: Secondary | ICD-10-CM | POA: Diagnosis not present

## 2024-11-22 DIAGNOSIS — Y900 Blood alcohol level of less than 20 mg/100 ml: Secondary | ICD-10-CM | POA: Diagnosis present

## 2024-11-22 DIAGNOSIS — Y848 Other medical procedures as the cause of abnormal reaction of the patient, or of later complication, without mention of misadventure at the time of the procedure: Secondary | ICD-10-CM | POA: Diagnosis not present

## 2024-11-22 DIAGNOSIS — K7200 Acute and subacute hepatic failure without coma: Secondary | ICD-10-CM | POA: Diagnosis not present

## 2024-11-22 DIAGNOSIS — I5023 Acute on chronic systolic (congestive) heart failure: Secondary | ICD-10-CM | POA: Diagnosis present

## 2024-11-22 DIAGNOSIS — J189 Pneumonia, unspecified organism: Secondary | ICD-10-CM | POA: Diagnosis not present

## 2024-11-22 DIAGNOSIS — Z932 Ileostomy status: Secondary | ICD-10-CM

## 2024-11-22 DIAGNOSIS — F419 Anxiety disorder, unspecified: Secondary | ICD-10-CM | POA: Diagnosis present

## 2024-11-22 DIAGNOSIS — Z930 Tracheostomy status: Secondary | ICD-10-CM

## 2024-11-22 DIAGNOSIS — I3139 Other pericardial effusion (noninflammatory): Secondary | ICD-10-CM | POA: Diagnosis not present

## 2024-11-22 DIAGNOSIS — I4901 Ventricular fibrillation: Secondary | ICD-10-CM | POA: Diagnosis not present

## 2024-11-22 DIAGNOSIS — Z720 Tobacco use: Secondary | ICD-10-CM | POA: Diagnosis present

## 2024-11-22 DIAGNOSIS — I4891 Unspecified atrial fibrillation: Secondary | ICD-10-CM | POA: Diagnosis not present

## 2024-11-22 DIAGNOSIS — I2489 Other forms of acute ischemic heart disease: Secondary | ICD-10-CM | POA: Diagnosis not present

## 2024-11-22 DIAGNOSIS — G9341 Metabolic encephalopathy: Secondary | ICD-10-CM | POA: Diagnosis not present

## 2024-11-22 DIAGNOSIS — Z7952 Long term (current) use of systemic steroids: Secondary | ICD-10-CM

## 2024-11-22 DIAGNOSIS — F05 Delirium due to known physiological condition: Secondary | ICD-10-CM | POA: Diagnosis not present

## 2024-11-22 DIAGNOSIS — I472 Ventricular tachycardia, unspecified: Secondary | ICD-10-CM | POA: Diagnosis not present

## 2024-11-22 DIAGNOSIS — A419 Sepsis, unspecified organism: Secondary | ICD-10-CM | POA: Diagnosis not present

## 2024-11-22 DIAGNOSIS — J690 Pneumonitis due to inhalation of food and vomit: Secondary | ICD-10-CM | POA: Diagnosis not present

## 2024-11-22 DIAGNOSIS — J9811 Atelectasis: Secondary | ICD-10-CM | POA: Diagnosis not present

## 2024-11-22 DIAGNOSIS — I4892 Unspecified atrial flutter: Secondary | ICD-10-CM | POA: Diagnosis not present

## 2024-11-22 DIAGNOSIS — Z430 Encounter for attention to tracheostomy: Secondary | ICD-10-CM

## 2024-11-22 DIAGNOSIS — E874 Mixed disorder of acid-base balance: Secondary | ICD-10-CM | POA: Diagnosis not present

## 2024-11-22 DIAGNOSIS — Z7901 Long term (current) use of anticoagulants: Secondary | ICD-10-CM

## 2024-11-22 DIAGNOSIS — I428 Other cardiomyopathies: Secondary | ICD-10-CM | POA: Diagnosis not present

## 2024-11-22 DIAGNOSIS — F10239 Alcohol dependence with withdrawal, unspecified: Secondary | ICD-10-CM | POA: Diagnosis not present

## 2024-11-22 DIAGNOSIS — D62 Acute posthemorrhagic anemia: Secondary | ICD-10-CM | POA: Diagnosis not present

## 2024-11-22 DIAGNOSIS — Z5320 Procedure and treatment not carried out because of patient's decision for unspecified reasons: Secondary | ICD-10-CM | POA: Diagnosis not present

## 2024-11-22 DIAGNOSIS — J942 Hemothorax: Secondary | ICD-10-CM | POA: Diagnosis not present

## 2024-11-22 DIAGNOSIS — Z79899 Other long term (current) drug therapy: Secondary | ICD-10-CM

## 2024-11-22 DIAGNOSIS — Z950 Presence of cardiac pacemaker: Secondary | ICD-10-CM

## 2024-11-22 DIAGNOSIS — N179 Acute kidney failure, unspecified: Secondary | ICD-10-CM | POA: Diagnosis not present

## 2024-11-22 DIAGNOSIS — K9189 Other postprocedural complications and disorders of digestive system: Secondary | ICD-10-CM | POA: Diagnosis not present

## 2024-11-22 DIAGNOSIS — I340 Nonrheumatic mitral (valve) insufficiency: Secondary | ICD-10-CM

## 2024-11-22 DIAGNOSIS — E871 Hypo-osmolality and hyponatremia: Secondary | ICD-10-CM | POA: Diagnosis not present

## 2024-11-22 DIAGNOSIS — Z8249 Family history of ischemic heart disease and other diseases of the circulatory system: Secondary | ICD-10-CM

## 2024-11-22 DIAGNOSIS — R58 Hemorrhage, not elsewhere classified: Secondary | ICD-10-CM | POA: Diagnosis not present

## 2024-11-22 DIAGNOSIS — Z952 Presence of prosthetic heart valve: Secondary | ICD-10-CM

## 2024-11-22 DIAGNOSIS — K559 Vascular disorder of intestine, unspecified: Secondary | ICD-10-CM | POA: Diagnosis not present

## 2024-11-22 DIAGNOSIS — R5381 Other malaise: Secondary | ICD-10-CM

## 2024-11-22 DIAGNOSIS — Z6820 Body mass index (BMI) 20.0-20.9, adult: Secondary | ICD-10-CM

## 2024-11-22 DIAGNOSIS — I5022 Chronic systolic (congestive) heart failure: Secondary | ICD-10-CM

## 2024-11-22 DIAGNOSIS — K30 Functional dyspepsia: Secondary | ICD-10-CM | POA: Diagnosis not present

## 2024-11-22 DIAGNOSIS — I080 Rheumatic disorders of both mitral and aortic valves: Secondary | ICD-10-CM | POA: Diagnosis present

## 2024-11-22 DIAGNOSIS — J9601 Acute respiratory failure with hypoxia: Secondary | ICD-10-CM

## 2024-11-22 DIAGNOSIS — F1020 Alcohol dependence, uncomplicated: Secondary | ICD-10-CM | POA: Diagnosis present

## 2024-11-22 DIAGNOSIS — I5020 Unspecified systolic (congestive) heart failure: Secondary | ICD-10-CM

## 2024-11-22 DIAGNOSIS — E1165 Type 2 diabetes mellitus with hyperglycemia: Secondary | ICD-10-CM | POA: Diagnosis not present

## 2024-11-22 DIAGNOSIS — J9602 Acute respiratory failure with hypercapnia: Secondary | ICD-10-CM | POA: Diagnosis not present

## 2024-11-22 DIAGNOSIS — I5082 Biventricular heart failure: Secondary | ICD-10-CM | POA: Diagnosis present

## 2024-11-22 DIAGNOSIS — E43 Unspecified severe protein-calorie malnutrition: Secondary | ICD-10-CM

## 2024-11-22 DIAGNOSIS — R570 Cardiogenic shock: Secondary | ICD-10-CM | POA: Diagnosis not present

## 2024-11-22 DIAGNOSIS — I509 Heart failure, unspecified: Principal | ICD-10-CM

## 2024-11-22 DIAGNOSIS — K6389 Other specified diseases of intestine: Secondary | ICD-10-CM | POA: Diagnosis not present

## 2024-11-22 DIAGNOSIS — E781 Pure hyperglyceridemia: Secondary | ICD-10-CM | POA: Diagnosis not present

## 2024-11-22 DIAGNOSIS — Z833 Family history of diabetes mellitus: Secondary | ICD-10-CM

## 2024-11-22 DIAGNOSIS — I5021 Acute systolic (congestive) heart failure: Secondary | ICD-10-CM | POA: Diagnosis present

## 2024-11-22 LAB — CBC WITH DIFFERENTIAL/PLATELET
Abs Immature Granulocytes: 0.02 K/uL (ref 0.00–0.07)
Basophils Absolute: 0.1 K/uL (ref 0.0–0.1)
Basophils Relative: 1 %
Eosinophils Absolute: 0.1 K/uL (ref 0.0–0.5)
Eosinophils Relative: 1 %
HCT: 44.2 % (ref 39.0–52.0)
Hemoglobin: 15.1 g/dL (ref 13.0–17.0)
Immature Granulocytes: 0 %
Lymphocytes Relative: 37 %
Lymphs Abs: 3.9 K/uL (ref 0.7–4.0)
MCH: 33.1 pg (ref 26.0–34.0)
MCHC: 34.2 g/dL (ref 30.0–36.0)
MCV: 96.9 fL (ref 80.0–100.0)
Monocytes Absolute: 0.9 K/uL (ref 0.1–1.0)
Monocytes Relative: 9 %
Neutro Abs: 5.6 K/uL (ref 1.7–7.7)
Neutrophils Relative %: 52 %
Platelets: 206 K/uL (ref 150–400)
RBC: 4.56 MIL/uL (ref 4.22–5.81)
RDW: 14.6 % (ref 11.5–15.5)
WBC: 10.6 K/uL — ABNORMAL HIGH (ref 4.0–10.5)
nRBC: 0 % (ref 0.0–0.2)

## 2024-11-22 LAB — URINALYSIS, ROUTINE W REFLEX MICROSCOPIC
Bacteria, UA: NONE SEEN
Bilirubin Urine: NEGATIVE
Glucose, UA: NEGATIVE mg/dL
Hgb urine dipstick: NEGATIVE
Ketones, ur: NEGATIVE mg/dL
Leukocytes,Ua: NEGATIVE
Nitrite: NEGATIVE
Protein, ur: >=300 mg/dL — AB
Specific Gravity, Urine: 1.025 (ref 1.005–1.030)
pH: 5 (ref 5.0–8.0)

## 2024-11-22 LAB — COMPREHENSIVE METABOLIC PANEL WITH GFR
ALT: 100 U/L — ABNORMAL HIGH (ref 0–44)
AST: 71 U/L — ABNORMAL HIGH (ref 15–41)
Albumin: 3.8 g/dL (ref 3.5–5.0)
Alkaline Phosphatase: 148 U/L — ABNORMAL HIGH (ref 38–126)
Anion gap: 13 (ref 5–15)
BUN: 14 mg/dL (ref 6–20)
CO2: 20 mmol/L — ABNORMAL LOW (ref 22–32)
Calcium: 9.2 mg/dL (ref 8.9–10.3)
Chloride: 106 mmol/L (ref 98–111)
Creatinine, Ser: 0.9 mg/dL (ref 0.61–1.24)
GFR, Estimated: >60 mL/min (ref >60)
Glucose, Bld: 107 mg/dL — ABNORMAL HIGH (ref 70–99)
Potassium: 4 mmol/L (ref 3.5–5.1)
Sodium: 139 mmol/L (ref 135–145)
Total Bilirubin: 1.3 mg/dL — ABNORMAL HIGH (ref 0.0–1.2)
Total Protein: 6.7 g/dL (ref 6.5–8.1)

## 2024-11-22 LAB — PRO BRAIN NATRIURETIC PEPTIDE: Pro Brain Natriuretic Peptide: 3354 pg/mL — ABNORMAL HIGH (ref <300.0)

## 2024-11-22 LAB — TROPONIN T, HIGH SENSITIVITY
Troponin T High Sensitivity: 27 ng/L — ABNORMAL HIGH (ref 0–19)
Troponin T High Sensitivity: 27 ng/L — ABNORMAL HIGH (ref 0–19)

## 2024-11-22 MED ORDER — THIAMINE MONONITRATE 100 MG PO TABS
100.0000 mg | ORAL_TABLET | Freq: Every day | ORAL | Status: DC
  Administered 2024-11-22 – 2024-12-03 (×10): 100 mg via ORAL
  Filled 2024-11-22 (×10): qty 1

## 2024-11-22 MED ORDER — FOLIC ACID 1 MG PO TABS
1.0000 mg | ORAL_TABLET | Freq: Every day | ORAL | Status: DC
  Administered 2024-11-22 – 2024-12-03 (×10): 1 mg via ORAL
  Filled 2024-11-22 (×10): qty 1

## 2024-11-22 MED ORDER — ACETAMINOPHEN 325 MG PO TABS
650.0000 mg | ORAL_TABLET | Freq: Four times a day (QID) | ORAL | Status: DC | PRN
  Administered 2024-11-27: 650 mg via ORAL
  Filled 2024-11-22: qty 2

## 2024-11-22 MED ORDER — ADULT MULTIVITAMIN W/MINERALS CH
1.0000 | ORAL_TABLET | Freq: Every day | ORAL | Status: DC
  Administered 2024-11-22 – 2024-12-03 (×10): 1 via ORAL
  Filled 2024-11-22 (×10): qty 1

## 2024-11-22 MED ORDER — LISINOPRIL 10 MG PO TABS
10.0000 mg | ORAL_TABLET | Freq: Every day | ORAL | Status: DC
  Administered 2024-11-22: 10 mg via ORAL
  Filled 2024-11-22: qty 1

## 2024-11-22 MED ORDER — LORAZEPAM 1 MG PO TABS: 1.0000 mg | ORAL_TABLET | ORAL | Status: AC | PRN

## 2024-11-22 MED ORDER — THIAMINE HCL 100 MG/ML IJ SOLN
100.0000 mg | Freq: Every day | INTRAMUSCULAR | Status: DC
  Filled 2024-11-22: qty 2

## 2024-11-22 MED ORDER — ALBUTEROL SULFATE HFA 108 (90 BASE) MCG/ACT IN AERS: 2.0000 | INHALATION_SPRAY | RESPIRATORY_TRACT | Status: DC | PRN

## 2024-11-22 MED ORDER — ALBUTEROL SULFATE (2.5 MG/3ML) 0.083% IN NEBU
2.5000 mg | INHALATION_SOLUTION | RESPIRATORY_TRACT | Status: DC | PRN
  Administered 2024-12-12 – 2024-12-20 (×3): 2.5 mg via RESPIRATORY_TRACT
  Filled 2024-11-22 (×3): qty 3

## 2024-11-22 MED ORDER — LORAZEPAM 2 MG/ML IJ SOLN: 1.0000 mg | INTRAMUSCULAR | Status: AC | PRN

## 2024-11-22 MED ORDER — FUROSEMIDE 10 MG/ML IJ SOLN
40.0000 mg | Freq: Once | INTRAMUSCULAR | Status: AC
  Administered 2024-11-22: 40 mg via INTRAVENOUS
  Filled 2024-11-22: qty 4

## 2024-11-22 MED ORDER — METOPROLOL TARTRATE 5 MG/5ML IV SOLN: 5.0000 mg | INTRAVENOUS | Status: DC | PRN

## 2024-11-22 MED ORDER — ONDANSETRON HCL 4 MG/2ML IJ SOLN: 4.0000 mg | Freq: Four times a day (QID) | INTRAMUSCULAR | Status: DC | PRN

## 2024-11-22 MED ORDER — IBUPROFEN 400 MG PO TABS: 400.0000 mg | ORAL_TABLET | Freq: Four times a day (QID) | ORAL | Status: DC | PRN

## 2024-11-22 MED ORDER — ONDANSETRON HCL 4 MG PO TABS: 4.0000 mg | ORAL_TABLET | Freq: Four times a day (QID) | ORAL | Status: DC | PRN

## 2024-11-22 MED ORDER — POLYETHYLENE GLYCOL 3350 17 G PO PACK
17.0000 g | PACK | Freq: Every day | ORAL | Status: DC | PRN
  Filled 2024-11-22: qty 1

## 2024-11-22 MED ORDER — ENOXAPARIN SODIUM 40 MG/0.4ML IJ SOSY
40.0000 mg | PREFILLED_SYRINGE | INTRAMUSCULAR | Status: DC
  Administered 2024-11-23 – 2024-11-25 (×3): 40 mg via SUBCUTANEOUS
  Filled 2024-11-22 (×3): qty 0.4

## 2024-11-22 MED ORDER — CARVEDILOL 3.125 MG PO TABS: 3.1250 mg | ORAL_TABLET | Freq: Two times a day (BID) | ORAL | Status: DC

## 2024-11-22 MED ORDER — FUROSEMIDE 10 MG/ML IJ SOLN
20.0000 mg | Freq: Four times a day (QID) | INTRAMUSCULAR | Status: DC
  Administered 2024-11-22 – 2024-11-23 (×2): 20 mg via INTRAVENOUS
  Filled 2024-11-22 (×2): qty 2

## 2024-11-22 MED ORDER — POTASSIUM CHLORIDE CRYS ER 20 MEQ PO TBCR
20.0000 meq | EXTENDED_RELEASE_TABLET | Freq: Two times a day (BID) | ORAL | Status: DC
  Administered 2024-11-22 – 2024-11-25 (×7): 20 meq via ORAL
  Filled 2024-11-22 (×7): qty 1

## 2024-11-22 NOTE — ED Triage Notes (Signed)
 Pt here from home with gen swelling( abd groin and legs ,) pt just finished antibiotics for walking pna

## 2024-11-22 NOTE — ED Notes (Signed)
 Family stated that pt does drink alcohol daily

## 2024-11-22 NOTE — Assessment & Plan Note (Addendum)
 IV Lasix O2 as needed Formal echo in the morning Cardiology consultation in the morning Add ACE for BP control as well as CHF Add beta-blocker KCl and monitor potassium.

## 2024-11-22 NOTE — ED Notes (Addendum)
 Pt was given steroid shot a week ago for his pneumonia. Azithromycin  prescription for pt has been completed. Pt still having shortness of breath, stated to this nurse it has gotten worse lately. Pt has edema to both lower extremities, as well as edema to his lower abdomen.

## 2024-11-22 NOTE — ED Notes (Signed)
 Pt also states he is sob when laying down at night

## 2024-11-22 NOTE — ED Provider Notes (Signed)
 This patient is a 43 year old male who drinks approximately 5 drinks of liquor per night, no prior cardiac history that he is aware of though he does tell me that he was treated for hypertension many many many years ago, he does not go to the doctor anymore.  He presents with increasing shortness of breath over the last couple of weeks but also noting that he is starting to become edematous and his legs and up onto his lower abdomen.  On my exam he does have 1+ pitting edema to the bilateral ankles and pretibial regions, he does not have any rales, he does have a friction rub on his cardiac exam, he does not have any JVD that is appreciable on exam but on a bedside ultrasound has fairly severe enlargement of the heart with a significant decrease in his ejection fraction and a small effusion.  I do not see any signs of tamponade  The patient is critically ill with new onset acute congestive heart failure, he is tachycardic tachypneic with evidence of heart failure on ultrasound and clinical exam and will need to be admitted to the hospital for aggressive diuresis cardiology consultation and likely echocardiogram  Hospitalist - Dr. Fredirick to admit  CRITICAL CARE Performed by: Redell JONETTA Pinal Total critical care time: 35 minutes Critical care time was exclusive of separately billable procedures and treating other patients. Critical care was necessary to treat or prevent imminent or life-threatening deterioration. Critical care was time spent personally by me on the following activities: development of treatment plan with patient and/or surrogate as well as nursing, discussions with consultants, evaluation of patient's response to treatment, examination of patient, obtaining history from patient or surrogate, ordering and performing treatments and interventions, ordering and review of laboratory studies, ordering and review of radiographic studies, pulse oximetry and re-evaluation of patient's condition.     Pinal Redell, MD 11/22/24 2121

## 2024-11-22 NOTE — Assessment & Plan Note (Signed)
 Begin carvedilol and lisinopril

## 2024-11-22 NOTE — Hospital Course (Addendum)
 Patient is a 43 year old male with past medical history significant for mild hypertension for which he takes the meds who has had increasing lower extremity swelling as well as shortness of breath over the last month.  He was treated for walking pneumonia with azithromycin  and prednisone .  He reports use of an inhaler from time to time and this was not improving his shortness of breath and his lower extremity swelling got worse so he decided to come to the hospital.  Patient reports heavy alcohol use at least 5 drinks per evening.  He does smoke cigarettes.  In the ED he was noted to have significant lower extremity swelling to his thighs and pro BNP that was 3354.  The patient denies previous alcohol withdrawal.  Patient was as noted to have elevated LFTs including AST ALT alk phos and T. bili.  Chest x-ray showed cardiomegaly only.  The EDP bedside ultrasound and felt like the EF was markedly reduced.  HPI: The patient is a 43 year old gentleman with a history of hypertension, 1/2 pack/day smoking, heavy alcohol use (estimated 5 shots per day), history of asthma, and a strong family history of heart disease who presented with worsening shortness of breath and orthopnea as well as lower extremity edema that has progressed over the last several weeks.  He was initially treated for pneumonia with antibiotics without improvement.  In the emergency department his proBNP was 3354.  Chest x-ray showed cardiomegaly but no pulmonary edema or pleural effusions.  Electrocardiogram showed sinus tachycardia at 114 with anterior septal Q waves and left atrial enlargement. Hs troponin was 27>27.  He was admitted and started on IV Lasix .  2D echocardiogram showed a left ventricular ejection fraction of 35 to 40% with severely reduced RV systolic function, severe mitral regurgitation, and severe aortic insufficiency.  There is some nodular thickening at the leaflet tips.  The patient had no fever or chills and white blood cell  count was 10.6 on admission.  There is no history of drug abuse. BC are negative.  Left ventricular diastolic diameter was 7.2 cm with a systolic diameter of 5.8 cm.  A cardiac MRI showed a diastolic diameter of 7.7 cm.  Left ventricular ejection fraction was 35% with global hypokinesis.  There was no LGE.  RVEF was 30%.  The aortic root and ascending aorta were normal.  The aortic valve was trileaflet with lack of central coaptation.  The regurgitant fraction was 51% consistent with severe AI.  The mitral valve had bileaflet thickening with lack of coaptation of the leaflets and regurgitant fraction of 45% consistent with severe regurgitation.  Cardiac catheterization today showed normal coronary anatomy.  Dr. Lucas discussed that even with LVAD surgery he would require aortic valve replacement and probably mitral valve replacement given the degree of regurgitation. Dr. Lucas did not think he is a transplant candidate at this time given his ongoing smoking and alcohol use. Dr. Lucas thinks the best option is to proceed with aortic and mitral valve replacements with planned LV Impella support with the hope that his left ventricle will recover with Impella followed by GDMT. Potential risks, benefits, and complications of the surgery were discussed with the patient and he agreed to proceed with surgery.  Hospital Course: Patient underwent an aortic valve replacement (size 27 mm) and mitral valve replacement (size 23 mm), and Impella 5.5. He was transported from the OR to [facility] ICU in stable condition. He was on Epinephrine , Nor epinephrine , and Milrinone  drips. He was extubated later  12/09. Nor epinephrine  was stopped and Epinephrine  drip was slowly weaned. He was started on Digoxin  0.125 mg daily. He has been transfused two times post op thus far. Chest tubes and blake drain were removed on POD 2. He was continued on CIWA precautions, Thiamine , MVI (history of alcohol abuse). He was weaned off the Insulin  drip.  His pre op HGA1C is 6.1. He has pre diabetes so will provide nutrition information with discharge paperwork and he will need to follow up with PCP after discharge. Advanced heart failure followed him closely post op and managed his diuresis. He is currently on Lasix  40 mg IV bid. He had thrombocytopenia post op. CXR showed small to moderate right pleural effusion. He underwent a right thoracentesis on 12/11 and 1000 cc were removed. Unfortunately, he had a significant decrease in his hemoglobin to 5.1 and CXR showed a large recurrent pleural effusion. He was given 3 units of PRBCs, 2 platelets, and SQ Heparin  was stopped. A right chest tube was placed. He was put on Epinephrine  for hypotension. He had a CTA which showed active contrast extravasation between the right 9th and 10th ribs with likely extension into the pleural space, suspicious for ongoing intercostal bleeding likely related to recent thoracentesis, moderate to large right pleural effusion (hemothorax), and  pneumatosis involving small bowel loops in the lower pelvis and the right colon with mesenteric venous gas and portal venous gas, concerning for bowel ischemia. Ultimately, patient underwent a coil embolization of right intercostal artery right 10 th rib by IR. He subsequently underwent exploratory lap, right colectomy without anastomosis, and placement of Abthera wound vacHe remains intubated and on multiple pressors . He returned to the OR on Sunday for re exploration and re anastomose bowel. He was started on TPN for malnutrition. Levo was weaned on 12/13. Regarding drips, he is on Epinephrine  and Milrinone  as of 12/15. Nor Epinephrine  was added later. He had a fever to 101.7, WBC is 14,100, he and has been on Zosyn  and Vancomycin . Platelets went as low as 56,000 so no Lovenox  or Heparin  for now. He returned to the OR on 12/16 in order to undergo reopening of recent laparotomy, small bowel resection, and end ileostomy. He remains critically ill.  Platelets have gone up to 102,000 on 12/17 and Micafungin  has been added to his antibiotic regimen (Candida albicans in trach aspirate 12/14, no organisms in BAL 12/18). He had a fever to 102.2 on 12/17 and blood cultures have been drawn. Blood culture negative to date. CT with non contrast was done (to evaluate retained left hemothorax). Result showed small residual right pleural fluid/hemothorax with right-sided pigtail chest catheter in place and dependent right lung consolidation/atelectasis with near complete right lower lobe opacification. Also, a moderate pericardial effusion, increased from previous study.  He returned to the OR for right VATS and drain hemothorax later 12/18. He had a brief fun of a flutter 12/18 (he has been on Amiodarone  drip). As of 12/19, he is on Milrinone  drip with co ox 72.7 and Nor epinephrine  drip. Micafungin  has been stopped.  Heparin  drip has been restarted. He was extubated 12/19. Unfortunately, hypercarbic on ABG 12/21, increasing CVP and pressor requirement. Epinephrine  drip was restarted and he was re intubated. NGT placed and 1.4L of bilious output was obtained. WBC increased to 22,800.Epinephrine  was stopped 12/22. Cultures showed rare enterobacter cloacae and rare candida albicans in BAL sensitive to Meropenem , which he was already on. Micafungin  was added again. WBC decreased to 14,100 on 12/23. He  underwent placement of tracheostomy on 12/23. He then underwent removal of Impella on 12/24.  Chest tube was removed without complication on 12/25.  He continued on heparin  anticoagulation for A-fib, DVT prophylaxis and AVR/MVR.  He may require Coumadin  due to bowel resection.  This is being investigated by pharmacy.  He is showing improving strength and vent weaning is underway per CCM.  He has completed his Maxipime  and 12/20/2024 with his last day for micafungin .  He has been weaned off all drips.  He has been tolerating short trach trials.  His tube feeding regimens have  been adjusted.  He has progressed to a dysphagia 3 diet and oral intake was slowly increasing.   He developed bright re bleeding per rectum on 12/26/24.  Heparin  was discontinued and the Hct was monitored and remained stable. Respiratory status improved so the trach was capped on 1/3.  He tolerated this well allowing for decannulation on 01/05. Per pulmonary/CCM, he is to finish Ceftriaxone  on 12/29/2025. Per pharmacy note yesterday, Heparin  stopped and patient to continue on Coumadin  (for 3 months). He was given 10 mg 01/05. PT and INR are to be monitored daily. Patient will need follow up with Dr. Lucas after discharge from CIR, a PT/INR appointment for 48 hours after discharge from CIR, and a follow up with advanced heart failure . He is very deconditioned from prolonged hospitalization. He is felt stable for discharge to CIR today.

## 2024-11-22 NOTE — H&P (Signed)
 History and Physical    Patient: Alejandro Lopez FMW:[id number] DOB: [birth year] DOA: 11/22/2024 DOS: the patient was seen and examined on 11/22/2024 PCP: Patient, No Pcp Per  Patient coming from: Home  Chief Complaint: No chief complaint on file.  HPI: Alejandro Lopez is a 43 y.o. male with medical history significant of mild hypertension for which he takes the meds who has had increasing lower extremity swelling as well as shortness of breath over the last month.  He was treated for walking pneumonia with azithromycin  and prednisone .  He reports use of an inhaler from time to time and this was not improving his shortness of breath and his lower extremity swelling got worse so he decided to come to the hospital.  Patient reports heavy alcohol use at least 5 drinks per evening.  He does smoke cigarettes.  In the ED he was noted to have significant lower extremity swelling to his thighs and pro BNP that was 3354.  The patient denies previous alcohol withdrawal.  Patient was as noted to have elevated LFTs including AST ALT alk phos and T. bili.  Chest x-ray showed cardiomegaly only.  The EDP bedside ultrasound and felt like the EF was markedly reduced.  Review of Systems: As mentioned in the history of present illness. All other systems reviewed and are negative. Past Medical History:  Diagnosis Date   Asthma    Past Surgical History:  Procedure Laterality Date   FRACTURE SURGERY     Social History:  reports that he has been smoking cigarettes. He has never used smokeless tobacco. He reports current alcohol use of about 4.0 standard drinks of alcohol per week. He reports that he does not use drugs.  No Known Allergies  Family History  Problem Relation Age of Onset   Stroke Mother    Hypertension Mother    Diabetes Mother     Prior to Admission medications   Medication Sig Start Date End Date Taking? Authorizing Provider  albuterol  (VENTOLIN  HFA) 108 (90 Base) MCG/ACT inhaler Inhale  2 puffs into the lungs every 4 (four) hours as needed. 10/14/23   Leath-Warren, Etta PARAS, NP  ibuprofen  (ADVIL ) 800 MG tablet Take 1 tablet (800 mg total) by mouth every 8 (eight) hours as needed for moderate pain. 11/07/22   Zammit, Joseph, MD  predniSONE  (DELTASONE ) 20 MG tablet Take 2 tablets (40 mg total) by mouth daily with breakfast. 10/14/23   Leath-Warren, Etta PARAS, NP  promethazine -dextromethorphan (PROMETHAZINE -DM) 6.25-15 MG/5ML syrup Take 5 mLs by mouth 4 (four) times daily as needed. 10/14/23   Gilmer Etta PARAS, NP    Physical Exam: Vitals:   11/22/24 2050 11/22/24 2100 11/22/24 2110 11/22/24 2115  BP:  (!) 143/99    Pulse:  (!) 118 (!) 110 (!) 112  Resp: (!) 24 (!) 31 (!) 21 (!) 23  Temp:      TempSrc:      SpO2:  95% 100% 99%   Physical Examination: General appearance - alert, well appearing, and in no distress Chest -rales noted in left lower lobe Heart -tachy rate and regular rhythm, systolic murmur 2/6 at 2nd left intercostal space Abdomen - soft, nontender, nondistended, no masses or organomegaly Extremities - peripheral pulses normal, no pedal edema, no clubbing or cyanosis  Data Reviewed: Results for orders placed or performed during the hospital encounter of 11/22/24 (from the past 24 hours)  Comprehensive metabolic panel     Status: Abnormal   Collection Time: 11/22/24  7:58 PM  Result Value Ref Range   Sodium 139 135 - 145 mmol/L   Potassium 4.0 3.5 - 5.1 mmol/L   Chloride 106 98 - 111 mmol/L   CO2 20 (L) 22 - 32 mmol/L   Glucose, Bld 107 (H) 70 - 99 mg/dL   BUN 14 6 - 20 mg/dL   Creatinine, Ser 9.09 0.61 - 1.24 mg/dL   Calcium 9.2 8.9 - 89.6 mg/dL   Total Protein 6.7 6.5 - 8.1 g/dL   Albumin 3.8 3.5 - 5.0 g/dL   AST 71 (H) 15 - 41 U/L   ALT 100 (H) 0 - 44 U/L   Alkaline Phosphatase 148 (H) 38 - 126 U/L   Total Bilirubin 1.3 (H) 0.0 - 1.2 mg/dL   GFR, Estimated >39 >39 mL/min   Anion gap 13 5 - 15  CBC with Differential     Status:  Abnormal   Collection Time: 11/22/24  7:58 PM  Result Value Ref Range   WBC 10.6 (H) 4.0 - 10.5 K/uL   RBC 4.56 4.22 - 5.81 MIL/uL   Hemoglobin 15.1 13.0 - 17.0 g/dL   HCT 55.7 60.9 - 47.9 %   MCV 96.9 80.0 - 100.0 fL   MCH 33.1 26.0 - 34.0 pg   MCHC 34.2 30.0 - 36.0 g/dL   RDW 85.3 88.4 - 84.4 %   Platelets 206 150 - 400 K/uL   nRBC 0.0 0.0 - 0.2 %   Neutrophils Relative % 52 %   Neutro Abs 5.6 1.7 - 7.7 K/uL   Lymphocytes Relative 37 %   Lymphs Abs 3.9 0.7 - 4.0 K/uL   Monocytes Relative 9 %   Monocytes Absolute 0.9 0.1 - 1.0 K/uL   Eosinophils Relative 1 %   Eosinophils Absolute 0.1 0.0 - 0.5 K/uL   Basophils Relative 1 %   Basophils Absolute 0.1 0.0 - 0.1 K/uL   Immature Granulocytes 0 %   Abs Immature Granulocytes 0.02 0.00 - 0.07 K/uL  Pro Brain natriuretic peptide     Status: Abnormal   Collection Time: 11/22/24  7:58 PM  Result Value Ref Range   Pro Brain Natriuretic Peptide 3,354.0 (H) <300.0 pg/mL  Urinalysis, Routine w reflex microscopic -Urine, Clean Catch     Status: Abnormal   Collection Time: 11/22/24  8:22 PM  Result Value Ref Range   Color, Urine AMBER (A) YELLOW   APPearance CLEAR CLEAR   Specific Gravity, Urine 1.025 1.005 - 1.030   pH 5.0 5.0 - 8.0   Glucose, UA NEGATIVE NEGATIVE mg/dL   Hgb urine dipstick NEGATIVE NEGATIVE   Bilirubin Urine NEGATIVE NEGATIVE   Ketones, ur NEGATIVE NEGATIVE mg/dL   Protein, ur >=699 (A) NEGATIVE mg/dL   Nitrite NEGATIVE NEGATIVE   Leukocytes,Ua NEGATIVE NEGATIVE   RBC / HPF 0-5 0 - 5 RBC/hpf   WBC, UA 0-5 0 - 5 WBC/hpf   Bacteria, UA NONE SEEN NONE SEEN   Squamous Epithelial / HPF 0-5 0 - 5 /HPF   Mucus PRESENT    Hyaline Casts, UA PRESENT    DG Chest Portable 1 View Result Date: 11/22/2024 EXAM: 1 VIEW(S) XRAY OF THE CHEST 11/22/2024 08:53:03 PM COMPARISON: 9 / 15 / 22. CLINICAL HISTORY: sob FINDINGS: LUNGS AND PLEURA: No focal pulmonary opacity. No pleural effusion. No pneumothorax. HEART AND MEDIASTINUM:  Cardiomegaly. BONES AND SOFT TISSUES: No acute osseous abnormality. IMPRESSION: 1. No acute cardiopulmonary process. Cardiomegaly. Electronically signed by: Norman Gatlin MD 11/22/2024 09:12 PM EST RP Workstation: [id number]  Assessment and Plan: * CHF (congestive heart failure) (HCC) IV Lasix O2 as needed Formal echo in the morning Cardiology consultation in the morning Add ACE for BP control as well as CHF Add beta-blocker KCl and monitor potassium.  Abnormal LFTs Likely secondary to ongoing alcohol use Check right quadrant ultrasound Hepatitis panel pending  Alcohol use disorder, moderate, dependence (HCC) CIWA protocol Strongly recommend avoidance of alcohol going forward  Tobacco use Patient offered and declined nicotine patch He would benefit from quitting smoking  Essential hypertension Begin carvedilol and lisinopril      Advance Care Planning:   Code Status: Not on file full  Consults: None but will need to touch base with cardiology in the morning  Family Communication: Patient at bedside  Severity of Illness: The appropriate patient status for this patient is OBSERVATION. Observation status is judged to be reasonable and necessary in order to provide the required intensity of service to ensure the patient's safety. The patient's presenting symptoms, physical exam findings, and initial radiographic and laboratory data in the context of their medical condition is felt to place them at decreased risk for further clinical deterioration. Furthermore, it is anticipated that the patient will be medically stable for discharge from the hospital within 2 midnights of admission.   Author: Glenys GORMAN Birk, MD 11/22/2024 9:19 PM  For on call review [email or web address].

## 2024-11-22 NOTE — Assessment & Plan Note (Signed)
 CIWA protocol Strongly recommend avoidance of alcohol going forward

## 2024-11-22 NOTE — ED Provider Notes (Signed)
 [place] EMERGENCY DEPARTMENT AT [facility] Provider Note   CSN: [id number] Arrival date & time: 11/22/24  8068     Patient presents with: No chief complaint on file.   Alejandro Lopez is a 43 y.o. male with a history including prior history of asthma, also stating he  completed a course of Zithromax  for community-acquired pneumonia the end of October presenting for evaluation of increased swelling including his bilateral lower extremities, groin abdomen and he reports a fullness sensation in his lower back as well.  He states the antibiotics did not improve his symptoms.  He also endorses persistent shortness of breath which is worsened with he lies flat.  He has had a nonproductive cough, he denies fevers or chills, he denies chest pain but endorses rapid heart rate which is worse with ambulation.  He smokes cigarettes daily and also drinks 4-5 EtOH beverages per day.  No PCP, has not seen an MD in many years.   The history is provided by the patient.       Prior to Admission medications   Medication Sig Start Date End Date Taking? Authorizing Provider  albuterol  (VENTOLIN  HFA) 108 (90 Base) MCG/ACT inhaler Inhale 2 puffs into the lungs every 4 (four) hours as needed. 10/14/23   Leath-Warren, Etta PARAS, NP  ibuprofen  (ADVIL ) 800 MG tablet Take 1 tablet (800 mg total) by mouth every 8 (eight) hours as needed for moderate pain. 11/07/22   Suzette Pac, MD  predniSONE  (DELTASONE ) 20 MG tablet Take 2 tablets (40 mg total) by mouth daily with breakfast. 10/14/23   Leath-Warren, Etta PARAS, NP  promethazine -dextromethorphan (PROMETHAZINE -DM) 6.25-15 MG/5ML syrup Take 5 mLs by mouth 4 (four) times daily as needed. 10/14/23   Leath-Warren, Etta PARAS, NP    Allergies: Patient has no known allergies.    Review of Systems  Constitutional:  Negative for chills and fever.  HENT:  Negative for congestion and sore throat.   Eyes: Negative.   Respiratory:  Positive for cough,  chest tightness and shortness of breath.   Cardiovascular:  Positive for palpitations and leg swelling. Negative for chest pain.  Gastrointestinal:  Negative for abdominal pain, nausea and vomiting.  Genitourinary: Negative.   Musculoskeletal:  Negative for arthralgias, joint swelling, myalgias and neck pain.  Skin: Negative.  Negative for color change, rash and wound.  Neurological:  Positive for weakness. Negative for dizziness, light-headedness, numbness and headaches.  Psychiatric/Behavioral: Negative.      Updated Vital Signs BP (!) 135/97   Pulse (!) 112   Temp 98.3 F (36.8 C) (Oral)   Resp (!) 23   SpO2 99%   Physical Exam Vitals and nursing note reviewed.  Constitutional:      Appearance: He is well-developed.  HENT:     Head: Normocephalic and atraumatic.  Eyes:     Conjunctiva/sclera: Conjunctivae normal.  Cardiovascular:     Rate and Rhythm: Regular rhythm. Tachycardia present.     Heart sounds:     Friction rub present.  Pulmonary:     Effort: Pulmonary effort is normal.     Breath sounds: Normal breath sounds. Decreased air movement present. No wheezing, rhonchi or rales.  Abdominal:     General: Bowel sounds are normal.     Palpations: Abdomen is soft. There is no mass.     Tenderness: There is no abdominal tenderness.     Comments: Pitting edema bilateral lower extremities through thighs,  involving groin,  lower abd and lower back.  No fluid wave appreciated.  Abd is nontender without mass.   Musculoskeletal:        General: Normal range of motion.     Cervical back: Normal range of motion.     Right lower leg: Edema present.     Left lower leg: Edema present.  Skin:    General: Skin is warm and dry.  Neurological:     Mental Status: He is alert.     (all labs ordered are listed, but only abnormal results are displayed) Labs Reviewed  COMPREHENSIVE METABOLIC PANEL WITH GFR - Abnormal; Notable for the following components:      Result Value   CO2  20 (*)    Glucose, Bld 107 (*)    AST 71 (*)    ALT 100 (*)    Alkaline Phosphatase 148 (*)    Total Bilirubin 1.3 (*)    All other components within normal limits  CBC WITH DIFFERENTIAL/PLATELET - Abnormal; Notable for the following components:   WBC 10.6 (*)    All other components within normal limits  URINALYSIS, ROUTINE W REFLEX MICROSCOPIC - Abnormal; Notable for the following components:   Color, Urine AMBER (*)    Protein, ur >=300 (*)    All other components within normal limits  PRO BRAIN NATRIURETIC PEPTIDE - Abnormal; Notable for the following components:   Pro Brain Natriuretic Peptide 3,354.0 (*)    All other components within normal limits  TROPONIN T, HIGH SENSITIVITY - Abnormal; Notable for the following components:   Troponin T High Sensitivity 27 (*)    All other components within normal limits  HEPATITIS PANEL, ACUTE    EKG: EKG Interpretation Date/Time:  Sunday November 22 2024 20:31:02 EST Ventricular Rate:  114 PR Interval:  145 QRS Duration:  90 QT Interval:  343 QTC Calculation: 473 R Axis:   -30  Text Interpretation: Sinus tachycardia Probable left atrial enlargement Left axis deviation Probable anteroseptal infarct, old Since last tracing rate faster Confirmed by Cleotilde Rogue ([id number]) on 11/22/2024 8:35:45 PM  Radiology: DG Chest Portable 1 View Result Date: 11/22/2024 EXAM: 1 VIEW(S) XRAY OF THE CHEST 11/22/2024 08:53:03 PM COMPARISON: 9 / 15 / 22. CLINICAL HISTORY: sob FINDINGS: LUNGS AND PLEURA: No focal pulmonary opacity. No pleural effusion. No pneumothorax. HEART AND MEDIASTINUM: Cardiomegaly. BONES AND SOFT TISSUES: No acute osseous abnormality. IMPRESSION: 1. No acute cardiopulmonary process. Cardiomegaly. Electronically signed by: Norman Gatlin MD 11/22/2024 09:12 PM EST RP Workstation: [id number]     Procedures   Medications Ordered in the ED  furosemide (LASIX) injection 40 mg (40 mg Intravenous Given 11/22/24 2110)                                     Medical Decision Making Refer to Dr. Molli note for MDM.  Amount and/or Complexity of Data Reviewed Labs: ordered.    Details: Significant labs including a BNP of 3354, AST 71, ALT 100 alk phos 148 and total bilirubin of 1.3, mild leukocytosis at 10.6, initial troponin is 27 Radiology: ordered.    Details: Chest x-ray reviewed, severe cardiomegaly, no effusion. ECG/medicine tests: ordered.    Details: Sinus tachycardia at 114, left atrial enlargement LAD.  Risk Decision regarding hospitalization.        Final diagnoses:  Acute congestive heart failure, unspecified heart failure type [facility])    ED Discharge Orders     None  Birdena Clarity, PA-C 11/22/24 2131    Cleotilde Rogue, MD 11/23/24 [phone]

## 2024-11-22 NOTE — Assessment & Plan Note (Signed)
 Patient offered and declined nicotine patch He would benefit from quitting smoking

## 2024-11-22 NOTE — Progress Notes (Signed)
 PT arrived to 2A alert, oriented, and ambulated to restroom. He does not appear short of breath and denies chest pain. CIWA is 0 currently. He reports last drink was this evening and he had 2 airplane bottles of liquor. Bertina Roark PEAK

## 2024-11-22 NOTE — Assessment & Plan Note (Signed)
 Likely secondary to ongoing alcohol use Check right quadrant ultrasound Hepatitis panel pending

## 2024-11-23 ENCOUNTER — Observation Stay (HOSPITAL_COMMUNITY)

## 2024-11-23 ENCOUNTER — Other Ambulatory Visit (HOSPITAL_COMMUNITY): Payer: Self-pay | Admitting: *Deleted

## 2024-11-23 DIAGNOSIS — F1010 Alcohol abuse, uncomplicated: Secondary | ICD-10-CM | POA: Diagnosis not present

## 2024-11-23 DIAGNOSIS — D649 Anemia, unspecified: Secondary | ICD-10-CM | POA: Diagnosis not present

## 2024-11-23 DIAGNOSIS — Z95811 Presence of heart assist device: Secondary | ICD-10-CM | POA: Diagnosis not present

## 2024-11-23 DIAGNOSIS — I341 Nonrheumatic mitral (valve) prolapse: Secondary | ICD-10-CM | POA: Diagnosis not present

## 2024-11-23 DIAGNOSIS — I5041 Acute combined systolic (congestive) and diastolic (congestive) heart failure: Secondary | ICD-10-CM | POA: Diagnosis not present

## 2024-11-23 DIAGNOSIS — E875 Hyperkalemia: Secondary | ICD-10-CM | POA: Diagnosis not present

## 2024-11-23 DIAGNOSIS — J9691 Respiratory failure, unspecified with hypoxia: Secondary | ICD-10-CM | POA: Diagnosis not present

## 2024-11-23 DIAGNOSIS — I110 Hypertensive heart disease with heart failure: Secondary | ICD-10-CM | POA: Diagnosis present

## 2024-11-23 DIAGNOSIS — R5381 Other malaise: Secondary | ICD-10-CM | POA: Diagnosis not present

## 2024-11-23 DIAGNOSIS — D696 Thrombocytopenia, unspecified: Secondary | ICD-10-CM | POA: Diagnosis not present

## 2024-11-23 DIAGNOSIS — Y848 Other medical procedures as the cause of abnormal reaction of the patient, or of later complication, without mention of misadventure at the time of the procedure: Secondary | ICD-10-CM | POA: Diagnosis not present

## 2024-11-23 DIAGNOSIS — G928 Other toxic encephalopathy: Secondary | ICD-10-CM | POA: Diagnosis not present

## 2024-11-23 DIAGNOSIS — Z932 Ileostomy status: Secondary | ICD-10-CM | POA: Diagnosis not present

## 2024-11-23 DIAGNOSIS — I509 Heart failure, unspecified: Secondary | ICD-10-CM

## 2024-11-23 DIAGNOSIS — F1020 Alcohol dependence, uncomplicated: Secondary | ICD-10-CM

## 2024-11-23 DIAGNOSIS — Z720 Tobacco use: Secondary | ICD-10-CM

## 2024-11-23 DIAGNOSIS — J9690 Respiratory failure, unspecified, unspecified whether with hypoxia or hypercapnia: Secondary | ICD-10-CM | POA: Diagnosis not present

## 2024-11-23 DIAGNOSIS — I10 Essential (primary) hypertension: Secondary | ICD-10-CM

## 2024-11-23 DIAGNOSIS — J9601 Acute respiratory failure with hypoxia: Secondary | ICD-10-CM | POA: Diagnosis not present

## 2024-11-23 DIAGNOSIS — R578 Other shock: Secondary | ICD-10-CM | POA: Diagnosis not present

## 2024-11-23 DIAGNOSIS — I5082 Biventricular heart failure: Secondary | ICD-10-CM | POA: Diagnosis not present

## 2024-11-23 DIAGNOSIS — Z7901 Long term (current) use of anticoagulants: Secondary | ICD-10-CM | POA: Diagnosis not present

## 2024-11-23 DIAGNOSIS — I5021 Acute systolic (congestive) heart failure: Secondary | ICD-10-CM | POA: Diagnosis present

## 2024-11-23 DIAGNOSIS — I3139 Other pericardial effusion (noninflammatory): Secondary | ICD-10-CM | POA: Diagnosis not present

## 2024-11-23 DIAGNOSIS — J942 Hemothorax: Secondary | ICD-10-CM | POA: Diagnosis not present

## 2024-11-23 DIAGNOSIS — K55059 Acute (reversible) ischemia of intestine, part and extent unspecified: Secondary | ICD-10-CM | POA: Diagnosis not present

## 2024-11-23 DIAGNOSIS — D62 Acute posthemorrhagic anemia: Secondary | ICD-10-CM | POA: Diagnosis not present

## 2024-11-23 DIAGNOSIS — I350 Nonrheumatic aortic (valve) stenosis: Secondary | ICD-10-CM | POA: Diagnosis not present

## 2024-11-23 DIAGNOSIS — D689 Coagulation defect, unspecified: Secondary | ICD-10-CM | POA: Diagnosis not present

## 2024-11-23 DIAGNOSIS — E874 Mixed disorder of acid-base balance: Secondary | ICD-10-CM | POA: Diagnosis not present

## 2024-11-23 DIAGNOSIS — K761 Chronic passive congestion of liver: Secondary | ICD-10-CM | POA: Diagnosis present

## 2024-11-23 DIAGNOSIS — R609 Edema, unspecified: Secondary | ICD-10-CM | POA: Diagnosis not present

## 2024-11-23 DIAGNOSIS — Z87891 Personal history of nicotine dependence: Secondary | ICD-10-CM | POA: Diagnosis not present

## 2024-11-23 DIAGNOSIS — J189 Pneumonia, unspecified organism: Secondary | ICD-10-CM | POA: Diagnosis not present

## 2024-11-23 DIAGNOSIS — Z432 Encounter for attention to ileostomy: Secondary | ICD-10-CM | POA: Diagnosis not present

## 2024-11-23 DIAGNOSIS — I472 Ventricular tachycardia, unspecified: Secondary | ICD-10-CM | POA: Diagnosis not present

## 2024-11-23 DIAGNOSIS — I080 Rheumatic disorders of both mitral and aortic valves: Secondary | ICD-10-CM | POA: Diagnosis present

## 2024-11-23 DIAGNOSIS — I5020 Unspecified systolic (congestive) heart failure: Secondary | ICD-10-CM

## 2024-11-23 DIAGNOSIS — R6521 Severe sepsis with septic shock: Secondary | ICD-10-CM | POA: Diagnosis not present

## 2024-11-23 DIAGNOSIS — I4901 Ventricular fibrillation: Secondary | ICD-10-CM | POA: Diagnosis not present

## 2024-11-23 DIAGNOSIS — E1165 Type 2 diabetes mellitus with hyperglycemia: Secondary | ICD-10-CM | POA: Diagnosis not present

## 2024-11-23 DIAGNOSIS — I38 Endocarditis, valve unspecified: Secondary | ICD-10-CM | POA: Diagnosis not present

## 2024-11-23 DIAGNOSIS — K559 Vascular disorder of intestine, unspecified: Secondary | ICD-10-CM | POA: Diagnosis not present

## 2024-11-23 DIAGNOSIS — Z4509 Encounter for adjustment and management of other cardiac device: Secondary | ICD-10-CM | POA: Diagnosis not present

## 2024-11-23 DIAGNOSIS — K7200 Acute and subacute hepatic failure without coma: Secondary | ICD-10-CM | POA: Diagnosis not present

## 2024-11-23 DIAGNOSIS — I352 Nonrheumatic aortic (valve) stenosis with insufficiency: Secondary | ICD-10-CM | POA: Diagnosis not present

## 2024-11-23 DIAGNOSIS — Z9911 Dependence on respirator [ventilator] status: Secondary | ICD-10-CM | POA: Diagnosis not present

## 2024-11-23 DIAGNOSIS — I2489 Other forms of acute ischemic heart disease: Secondary | ICD-10-CM | POA: Diagnosis not present

## 2024-11-23 DIAGNOSIS — D6959 Other secondary thrombocytopenia: Secondary | ICD-10-CM | POA: Diagnosis not present

## 2024-11-23 DIAGNOSIS — Z952 Presence of prosthetic heart valve: Secondary | ICD-10-CM | POA: Diagnosis not present

## 2024-11-23 DIAGNOSIS — I5023 Acute on chronic systolic (congestive) heart failure: Secondary | ICD-10-CM | POA: Diagnosis present

## 2024-11-23 DIAGNOSIS — J9602 Acute respiratory failure with hypercapnia: Secondary | ICD-10-CM | POA: Diagnosis not present

## 2024-11-23 DIAGNOSIS — J90 Pleural effusion, not elsewhere classified: Secondary | ICD-10-CM | POA: Diagnosis not present

## 2024-11-23 DIAGNOSIS — I351 Nonrheumatic aortic (valve) insufficiency: Secondary | ICD-10-CM | POA: Diagnosis not present

## 2024-11-23 DIAGNOSIS — R451 Restlessness and agitation: Secondary | ICD-10-CM | POA: Diagnosis not present

## 2024-11-23 DIAGNOSIS — K567 Ileus, unspecified: Secondary | ICD-10-CM | POA: Diagnosis not present

## 2024-11-23 DIAGNOSIS — E871 Hypo-osmolality and hyponatremia: Secondary | ICD-10-CM | POA: Diagnosis not present

## 2024-11-23 DIAGNOSIS — R739 Hyperglycemia, unspecified: Secondary | ICD-10-CM | POA: Diagnosis not present

## 2024-11-23 DIAGNOSIS — F1090 Alcohol use, unspecified, uncomplicated: Secondary | ICD-10-CM | POA: Diagnosis not present

## 2024-11-23 DIAGNOSIS — M7918 Myalgia, other site: Secondary | ICD-10-CM | POA: Diagnosis not present

## 2024-11-23 DIAGNOSIS — Z930 Tracheostomy status: Secondary | ICD-10-CM | POA: Diagnosis not present

## 2024-11-23 DIAGNOSIS — R570 Cardiogenic shock: Secondary | ICD-10-CM | POA: Diagnosis not present

## 2024-11-23 DIAGNOSIS — J690 Pneumonitis due to inhalation of food and vomit: Secondary | ICD-10-CM | POA: Diagnosis not present

## 2024-11-23 DIAGNOSIS — F10239 Alcohol dependence with withdrawal, unspecified: Secondary | ICD-10-CM | POA: Diagnosis not present

## 2024-11-23 DIAGNOSIS — R509 Fever, unspecified: Secondary | ICD-10-CM | POA: Diagnosis not present

## 2024-11-23 DIAGNOSIS — F17210 Nicotine dependence, cigarettes, uncomplicated: Secondary | ICD-10-CM | POA: Diagnosis not present

## 2024-11-23 DIAGNOSIS — Y900 Blood alcohol level of less than 20 mg/100 ml: Secondary | ICD-10-CM | POA: Diagnosis present

## 2024-11-23 DIAGNOSIS — G9341 Metabolic encephalopathy: Secondary | ICD-10-CM | POA: Diagnosis not present

## 2024-11-23 DIAGNOSIS — A419 Sepsis, unspecified organism: Secondary | ICD-10-CM | POA: Diagnosis not present

## 2024-11-23 DIAGNOSIS — I340 Nonrheumatic mitral (valve) insufficiency: Secondary | ICD-10-CM | POA: Diagnosis not present

## 2024-11-23 DIAGNOSIS — E43 Unspecified severe protein-calorie malnutrition: Secondary | ICD-10-CM | POA: Diagnosis not present

## 2024-11-23 LAB — COMPREHENSIVE METABOLIC PANEL WITH GFR
ALT: 97 U/L — ABNORMAL HIGH (ref 0–44)
AST: 67 U/L — ABNORMAL HIGH (ref 15–41)
Albumin: 3.7 g/dL (ref 3.5–5.0)
Alkaline Phosphatase: 157 U/L — ABNORMAL HIGH (ref 38–126)
Anion gap: 10 (ref 5–15)
BUN: 13 mg/dL (ref 6–20)
CO2: 28 mmol/L (ref 22–32)
Calcium: 9.5 mg/dL (ref 8.9–10.3)
Chloride: 104 mmol/L (ref 98–111)
Creatinine, Ser: 0.89 mg/dL (ref 0.61–1.24)
GFR, Estimated: >60 mL/min (ref >60)
Glucose, Bld: 91 mg/dL (ref 70–99)
Potassium: 3.8 mmol/L (ref 3.5–5.1)
Sodium: 141 mmol/L (ref 135–145)
Total Bilirubin: 1.6 mg/dL — ABNORMAL HIGH (ref 0.0–1.2)
Total Protein: 6.8 g/dL (ref 6.5–8.1)

## 2024-11-23 LAB — HEPATITIS PANEL, ACUTE
HCV Ab: NONREACTIVE
Hep A IgM: NONREACTIVE
Hep B C IgM: NONREACTIVE
Hepatitis B Surface Ag: NONREACTIVE

## 2024-11-23 LAB — HIV ANTIBODY (ROUTINE TESTING W REFLEX): HIV Screen 4th Generation wRfx: NONREACTIVE

## 2024-11-23 LAB — PROTIME-INR
INR: 1.1 (ref 0.8–1.2)
Prothrombin Time: 15.1 s (ref 11.4–15.2)

## 2024-11-23 LAB — GAMMA GT: GGT: 145 U/L — ABNORMAL HIGH (ref 7–50)

## 2024-11-23 LAB — TSH: TSH: 1.86 u[IU]/mL (ref 0.350–4.500)

## 2024-11-23 MED ORDER — FUROSEMIDE 10 MG/ML IJ SOLN
40.0000 mg | Freq: Two times a day (BID) | INTRAMUSCULAR | Status: DC
  Administered 2024-11-23 – 2024-11-26 (×7): 40 mg via INTRAVENOUS
  Filled 2024-11-23 (×8): qty 4

## 2024-11-23 MED ORDER — FUROSEMIDE 40 MG PO TABS
40.0000 mg | ORAL_TABLET | Freq: Two times a day (BID) | ORAL | Status: DC
  Administered 2024-11-23: 40 mg via ORAL
  Filled 2024-11-23: qty 1

## 2024-11-23 MED ORDER — SPIRONOLACTONE 12.5 MG HALF TABLET
12.5000 mg | ORAL_TABLET | Freq: Every day | ORAL | Status: DC
  Administered 2024-11-23 – 2024-11-25 (×3): 12.5 mg via ORAL
  Filled 2024-11-23 (×3): qty 1

## 2024-11-23 NOTE — Hospital Course (Addendum)
 43 y/o male with a history of hypertension, tobacco use, alcohol use and asthma presenting with worsening lower extremity edema and swelling of his abdomen and groin.  Patient has also been complaining of shortness of breath and nonproductive cough for since around Halloween time.  He has orthopnea type symptoms.  Patient states that he recently completed a course of azithromycin  for pneumonia on 11/18/24.  Since then, the patient states that his lower extremity edema and shortness of breath have worsened. He drinks 5 drinks of liquor daily and continues to smoke cigarettes.  Denies any fevers, chills, chest pain, abdominal pain, nausea, vomiting, diarrhea.  In the ED, the patient was afebrile hemodynamic stable with oxygen  saturation 97-99% room air.  WBC 10.6, hemoglobin 15.1, platelets 206.  AST 71, ALT 100, alk phosphatase 148, total bilirubin 1.3.  Sodium 141, potassium 3.8, bicarbonate 28, serum creatinine 0.89.  TSH 1.860.  UA was negative for pyuria.  proBNP 3354.  Troponin 27>> 27.  Chest x-ray was negative for edema or infiltrates.  EKG showed sinus rhythm with nonspecific T wave change.  Patient was started on IV furosemide.  Cardiology was consulted for management.

## 2024-11-23 NOTE — Discharge Instructions (Signed)
  Providers Accepting New Patients in [place], [place]    [facility] [address]  [place], [place] [address] [phone] Accepts most insurances  [facility] [address] [place], [place] [address] [phone] Accepts most insurances  [facility] [address] [place], [place] [address]  [phone] Must meet requirements  [facility] [address] [place], [place] [address] [phone] Accepts most insurances  [facility] [address]  [place], [place] [address] [phone] Accepts most insurances  [facility] [address]   [place], [place]   [phone] Accepts most insurances  [facility])  [phone] [address]  [place], [place] [address] [phone] Accepts most insurances     [facility] [address]  [place], [place] [address] [phone] Accepts most insurances  [facility] Department [address] [place], [place] [address] [phone] option 1 Accepts Medicaid and [facility] Internal Medicine [address]  [place], [place] [address] [phone] Accepts most insurances  Benita Outhouse, MD [address] [place], [place] [address] [phone] Accepts most insurances  [facility] [address]  [place], [place] [address] [phone] Accepts most insurances  [facility] [phone] [address] [place], [place] [address] [phone] Accepts most insurances  [facility] [facility], [address] [place], [place] [address] [phone]  Accepts most insurances     Discharge Instructions:  1. You may shower, please wash incisions daily with soap and water and keep dry.  If you wish to cover wounds with dressing you may do so but please keep clean and change daily.  No tub baths or swimming until incisions have  completely healed.  If your incisions become red or develop any drainage please call our office at [phone]  2. No Driving until cleared by Dr. Jeralyn office and you are no longer using narcotic pain medications  3. Monitor your weight daily.. Please use the same scale and weigh at same time... If you gain 5-10 lbs in 48 hours with associated lower extremity swelling, please contact our office at [phone]  4. Fever of 101.5 for at least 24 hours with no source, please contact our office at [phone]  5. Activity- up as tolerated, please walk at least 3 times per day.  Avoid strenuous activity, no lifting, pushing, or pulling with your arms over 8-10 lbs for a minimum of 6 weeks  6. If any questions or concerns arise, please do not hesitate to contact our office at [phone]

## 2024-11-23 NOTE — Consult Note (Signed)
 CARDIOLOGY CONSULTATION  Patient ID: Alejandro Lopez; [id number]; 31-Jan-1981   Admit date: 11/22/2024 Date of Consult: 11/23/2024  Primary Care Provider: Patient, No Pcp Per Primary Cardiologist: New to [facility]  HISTORY OF PRESENT ILLNESS  Alejandro Lopez is a 43 y.o. male with a history of asthma, no regular medical care as an outpatient, now presenting with worsening bilateral leg edema and orthopnea over the last several weeks.  He states that he started to notice swelling in his legs back in October, apparently seen at urgent care and diagnosed with pneumonia for which he completed a course of antibiotics.  He states that this did not improve his symptoms, also has NYHA class II-III dyspnea on exertion, no chest pain or palpitations, no syncope.  He drinks alcohol daily, states 5 airplane bottles.  Denies any other illicit substance use other than marijuana.  No known history of hypertension or diabetes.  He also smokes cigarettes.  Currently admitted on IV Lasix, net urine output of approximately 3800 cc last 24 hours.  He was also started on low-dose Coreg by primary team.  Formal echocardiogram is pending although reportedly EDP evaluation with POCUS indicated reduced LVEF.  ROS  Pertinent review in history of present illness.  No fevers or chills.  Past Medical History:  Diagnosis Date   Asthma     Past Surgical History:  Procedure Laterality Date   FRACTURE SURGERY       INPATIENT MEDICATIONS Scheduled Meds:  carvedilol  3.125 mg Oral BID WC   enoxaparin (LOVENOX) injection  40 mg Subcutaneous Q24H   folic acid  1 mg Oral Daily   furosemide  40 mg Oral BID   multivitamin with minerals  1 tablet Oral Daily   potassium chloride  20 mEq Oral BID   thiamine  100 mg Oral Daily   Or   thiamine  100 mg Intravenous Daily    PRN Meds: acetaminophen , albuterol , LORazepam **OR** LORazepam, metoprolol tartrate, ondansetron  **OR** ondansetron  (ZOFRAN ) IV,  polyethylene glycol  ALLERGIES No Known Allergies  SOCIAL HISTORY  Social History   Tobacco Use   Smoking status: Every Day    Current packs/day: 1.00    Types: Cigarettes   Smokeless tobacco: Never  Substance Use Topics   Alcohol use: Yes    Alcohol/week: 4.0 standard drinks of alcohol    Types: 4 Shots of liquor per week    Comment: every day     FAMILY HISTORY   The patient's family history includes Diabetes in his mother; Hypertension in his mother; Stroke in his mother.  PHYSICAL EXAM & DATA  Vitals:   11/22/24 2201 11/23/24 0204 11/23/24 0424 11/23/24 0538  BP: (!) 134/97 107/71  120/77  Pulse: (!) 118 (!) 103  98  Resp:      Temp: 97.7 F (36.5 C) 98 F (36.7 C)  97.6 F (36.4 C)  TempSrc: Oral Oral  Oral  SpO2: 99% 99%  97%  Weight: 79.1 kg  76.4 kg   Height: 5' 10 (1.778 m)  5' 10 (1.778 m)     Intake/Output Summary (Last 24 hours) at 11/23/2024 0838 Last data filed at 11/23/2024 0826 Gross per 24 hour  Intake 720 ml  Output 4625 ml  Net -3905 ml   Filed Weights   11/22/24 2201 11/23/24 0424  Weight: 79.1 kg 76.4 kg   Body mass index is 24.17 kg/m.   Gen: Patient appears comfortable at rest. HEENT: Conjunctiva and lids normal, oropharynx clear,  poor dentition. Neck: Supple, no elevated JVP or carotid bruits. Lungs: Clear to auscultation, nonlabored breathing at rest. Cardiac: Indistinct PMI, RRR, possible summation gallop, 3/6 apical systolic murmur suggesting mitral regurgitation. Abdomen: Soft, nontender, bowel sounds present. Extremities: 2+ bilateral lower leg edema. Skin: Warm and dry. Musculoskeletal: No kyphosis. Neuropsychiatric: Alert and oriented x3, affect grossly appropriate.  EKG:  An ECG dated 11/22/2024 was personally reviewed today and demonstrated:  Sinus tachycardia with left atrial enlargement and leftward axis.  Telemetry:  I personally reviewed telemetry which shows sinus rhythm.  RELEVANT CV STUDIES  No prior cardiac  testing for review.  LABORATORY DATA  Chemistry Recent Labs  Lab 11/22/24 1958 11/23/24 0436  NA 139 141  K 4.0 3.8  CL 106 104  CO2 20* 28  GLUCOSE 107* 91  BUN 14 13  CREATININE 0.90 0.89  CALCIUM 9.2 9.5  GFRNONAA >60 >60  ANIONGAP 13 10    Recent Labs  Lab 11/22/24 1958 11/23/24 0436  PROT 6.7 6.8  ALBUMIN 3.8 3.7  AST 71* 67*  ALT 100* 97*  ALKPHOS 148* 157*  BILITOT 1.3* 1.6*   Hematology Recent Labs  Lab 11/22/24 1958  WBC 10.6*  RBC 4.56  HGB 15.1  HCT 44.2  MCV 96.9  MCH 33.1  MCHC 34.2  RDW 14.6  PLT 206   Cardiac Enzymes Recent Labs  Lab 11/22/24 1958 11/22/24 2253  TRNPT 27* 27*    BNP Recent Labs  Lab 11/22/24 1958  PROBNP 3,354.0*     RADIOLOGY/STUDIES DG Chest Portable 1 View Result Date: 11/22/2024 EXAM: 1 VIEW(S) XRAY OF THE CHEST 11/22/2024 08:53:03 PM COMPARISON: 9 / 15 / 22. CLINICAL HISTORY: sob FINDINGS: LUNGS AND PLEURA: No focal pulmonary opacity. No pleural effusion. No pneumothorax. HEART AND MEDIASTINUM: Cardiomegaly. BONES AND SOFT TISSUES: No acute osseous abnormality. IMPRESSION: 1. No acute cardiopulmonary process. Cardiomegaly. Electronically signed by: Norman Gatlin MD 11/22/2024 09:12 PM EST RP Workstation: [id number]    ASSESSMENT & PLAN  1.  Suspected HFrEF, possibly alcohol induced based on history.  Presents with bilateral leg edema and orthopnea, pro-BNP 3354 with chest x-ray showing no acute process.  High-sensitivity troponin T levels are not consistent with ACS and he reports no recent chest pain.  TSH normal.  Exam suggest possible associated mitral regurgitation as well.  2.  Transaminitis, possibly associated with HFrEF although also in the setting of regular alcohol use.  Hepatic imaging and hepatitis workup ongoing per primary team.  3.  History of asthma, does not report any regular use of MDI at home.  4.  Elevated blood pressure, no standing diagnosis of primary hypertension, although no  regular medical care as an outpatient.  5.  Regular alcohol use as discussed above.  Formal echocardiogram pending for today.  Continue Lasix 40 mg IV twice daily for now, add Aldactone 12.5 mg daily.  Hold off on Coreg and initiation of ARB/ARNI for now pending further evaluation.  We discussed the critical importance of alcohol cessation and also need for ongoing follow-up as an outpatient for further adjustments in GDMT.  We will continue to follow and make further recommendations.  For questions or updates, please contact [facility] Please consult [email or web address] for contact info under   Signed, Jayson Sierras, MD  11/23/2024 8:38 AM

## 2024-11-23 NOTE — Plan of Care (Signed)

## 2024-11-23 NOTE — TOC Initial Note (Signed)
 Transition of Care [facility]) - Initial/Assessment Note    Patient Details  Name: Alejandro Lopez MRN: [id number] Date of Birth: 05-Mar-1981  Transition of Care [facility]) CM/SW Contact:    Mcarthur Saddie Kim, LCSW Phone Number: 11/23/2024, 8:24 AM  Clinical Narrative:  Pt admitted for CHF. TOC received consults for substance abuse counseling and CHF screening. Pt reports he lives with his wife and two children. He is independent with ADLs and indicates he will be working full-time after d/c from hospital. Pt admits to drinking 5 shots a day. He recognizes he needs to stop drinking and plans to quit on his own. Pt does not feel this will be an issue and does not want substance abuse treatment resources.  CHF screening completed. CHF is new diagnosis for pt this hospital stay. Reviewed new recommendations for daily weights, following heart healthy diet, taking medications as prescribed, and follow up with cardiologist. Pt reports he does not have PCP. PCP list added to AVS as well as CHF education. No other needs reported at this time. TOC will follow.                  Expected Discharge Plan: Home/Self Care Barriers to Discharge: Continued Medical Work up   Patient Goals and CMS Choice Patient states their goals for this hospitalization and ongoing recovery are:: return home   Choice offered to / list presented to : Patient  ownership interest in [facility].provided to::  (n/a)    Expected Discharge Plan and Services In-house Referral: Clinical Social Work     Living arrangements for the past 2 months: Single Family Home                                      Prior Living Arrangements/Services Living arrangements for the past 2 months: Single Family Home Lives with:: Spouse, Minor Children Patient language and need for interpreter reviewed:: Yes Do you feel safe going back to the place where you live?: Yes      Need for Family Participation in Patient  Care: No (Comment)     Criminal Activity/Legal Involvement Pertinent to Current Situation/Hospitalization: No - Comment as needed  Activities of Daily Living   ADL Screening (condition at time of admission) Independently performs ADLs?: Yes (appropriate for developmental age) Is the patient deaf or have difficulty hearing?: No Does the patient have difficulty seeing, even when wearing glasses/contacts?: No Does the patient have difficulty concentrating, remembering, or making decisions?: No  Permission Sought/Granted                  Emotional Assessment     Affect (typically observed): Appropriate Orientation: : Oriented to Self, Oriented to Place, Oriented to  Time, Oriented to Situation Alcohol / Substance Use: Alcohol Use Psych Involvement: No (comment)  Admission diagnosis:  CHF (congestive heart failure) (HCC) [I50.9] Acute congestive heart failure, unspecified heart failure type (HCC) [I50.9] Patient Active Problem List   Diagnosis Date Noted   CHF (congestive heart failure) (HCC) 11/22/2024   Essential hypertension 11/22/2024   Tobacco use 11/22/2024   Alcohol use disorder, moderate, dependence (HCC) 11/22/2024   Abnormal LFTs 11/22/2024   Low back pain 09/23/2014   PCP:  Patient, No Pcp Per Pharmacy:   [id number] - [place], [place] - [address] [place] [address] Phone: [phone] Fax: [phone]     Social Drivers of Health (SDOH) Social History: SDOH Screenings   Food Insecurity: No Food Insecurity (11/22/2024)  Housing: Low Risk  (11/22/2024)  Transportation Needs: No Transportation Needs (11/22/2024)  Utilities: Not At Risk (11/22/2024)  Social Connections: Unknown (05/06/2022)   Received from [facility]  Tobacco Use: High Risk (11/22/2024)   SDOH Interventions:     Readmission Risk Interventions     No data to display

## 2024-11-23 NOTE — Progress Notes (Signed)
*  PRELIMINARY RESULTS* Echocardiogram 2D Echocardiogram has been performed.  Alejandro Lopez 11/23/2024, 2:13 PM

## 2024-11-23 NOTE — Progress Notes (Signed)
 PROGRESS NOTE  Alejandro Lopez FMW:[id number] DOB: 1981-05-29 DOA: 11/22/2024 PCP: Patient, No Pcp Per  Brief History:  43 y/o male with a history of hypertension, tobacco use, alcohol use and asthma presenting with worsening lower extremity edema and swelling of his abdomen and groin.  Patient has also been complaining of shortness of breath and nonproductive cough for since around Halloween time.  He has orthopnea type symptoms.  Patient states that he recently completed a course of azithromycin  for pneumonia on 11/18/24.  Since then, the patient states that his lower extremity edema and shortness of breath have worsened. He drinks 5 drinks of liquor daily and continues to smoke cigarettes.  Denies any fevers, chills, chest pain, abdominal pain, nausea, vomiting, diarrhea.  In the ED, the patient was afebrile hemodynamic stable with oxygen  saturation 97-99% room air.  WBC 10.6, hemoglobin 15.1, platelets 206.  AST 71, ALT 100, alk phosphatase 148, total bilirubin 1.3.  Sodium 141, potassium 3.8, bicarbonate 28, serum creatinine 0.89.  TSH 1.860.  UA was negative for pyuria.  proBNP 3354.  Troponin 27>> 27.  Chest x-ray was negative for edema or infiltrates.  EKG showed sinus rhythm with nonspecific T wave change.  Patient was started on IV furosemide .  Cardiology was consulted for management.   Assessment/Plan: Acute HFrEF - 11/23/24 Echo--EF 35-40%, G3DD, severe MR, AI - Continue IV furosemide  - Daily weights - Initial proBNP 3354 - Accurate I's and O's - Holding carvedilol  - Appreciate cardiology consult>> continue IV furosemide  for now  Essential hypertension - Holding carvedilol  - Discontinue lisinopril  for now  Transaminasemia - Secondary to alcohol use and hepatic congestion -RUQ US  - Hepatitis panel  Alcohol use - Alcohol withdrawal protocol  Tobacco abuse - Cessation discussed  Elevated troponin -Secondary to demand ischemia -not consistent with  ACS      Family Communication:   no Family at bedside  Consultants:  cardiology  Code Status:  FULL   DVT Prophylaxis:  [place] Lovenox    Procedures: As Listed in Progress Note Above  Antibiotics: None       Subjective: Patient states he is breathing better.  He still has leg edema but it is a little better.  He denies any headache, chest pain, nausea, vomiting, diarrhea, abdominal pain.  Objective: Vitals:   11/23/24 0424 11/23/24 0538 11/23/24 1000 11/23/24 1209  BP:  120/77 125/83 (!) 130/91  Pulse:  98 (!) 102 98  Resp:    17  Temp:  97.6 F (36.4 C) 98 F (36.7 C) 98.1 F (36.7 C)  TempSrc:  Oral  Oral  SpO2:  97%  100%  Weight: 76.4 kg     Height: 5' 10 (1.778 m)       Intake/Output Summary (Last 24 hours) at 11/23/2024 1400 Last data filed at 11/23/2024 0910 Gross per 24 hour  Intake 960 ml  Output 4825 ml  Net -3865 ml   Weight change:  Exam:  General:  Pt is alert, follows commands appropriately, not in acute distress HEENT: No icterus, No thrush, No neck mass, [place]/AT Cardiovascular: RRR, S1/S2, no rubs, no gallops Respiratory: Bibasilar crackles but no wheezing. Abdomen: Soft/+BS, non tender, non distended, no guarding Extremities: 1 +LE edema, No lymphangitis, No petechiae, No rashes, no synovitis   Data Reviewed: I have personally reviewed following labs and imaging studies Basic Metabolic Panel: Recent Labs  Lab 11/22/24 1958 11/23/24 0436  NA 139 141  K 4.0 3.8  CL 106 104  CO2 20* 28  GLUCOSE 107* 91  BUN 14 13  CREATININE 0.90 0.89  CALCIUM 9.2 9.5   Liver Function Tests: Recent Labs  Lab 11/22/24 1958 11/23/24 0436  AST 71* 67*  ALT 100* 97*  ALKPHOS 148* 157*  BILITOT 1.3* 1.6*  PROT 6.7 6.8  ALBUMIN 3.8 3.7   No results for input(s): LIPASE, AMYLASE in the last 168 hours. No results for input(s): AMMONIA in the last 168 hours. Coagulation Profile: Recent Labs  Lab 11/23/24 0436  INR 1.1   CBC: Recent  Labs  Lab 11/22/24 1958  WBC 10.6*  NEUTROABS 5.6  HGB 15.1  HCT 44.2  MCV 96.9  PLT 206   Cardiac Enzymes: No results for input(s): CKTOTAL, CKMB, CKMBINDEX, TROPONINI in the last 168 hours. BNP: Invalid input(s): POCBNP CBG: No results for input(s): GLUCAP in the last 168 hours. HbA1C: No results for input(s): HGBA1C in the last 72 hours. Urine analysis:    Component Value Date/Time   COLORURINE AMBER (A) 11/22/2024 2022   APPEARANCEUR CLEAR 11/22/2024 2022   LABSPEC 1.025 11/22/2024 2022   PHURINE 5.0 11/22/2024 2022   GLUCOSEU NEGATIVE 11/22/2024 2022   HGBUR NEGATIVE 11/22/2024 2022   BILIRUBINUR NEGATIVE 11/22/2024 2022   KETONESUR NEGATIVE 11/22/2024 2022   PROTEINUR >=300 (A) 11/22/2024 2022   NITRITE NEGATIVE 11/22/2024 2022   LEUKOCYTESUR NEGATIVE 11/22/2024 2022   Sepsis Labs: @LABRCNTIP (procalcitonin:4,lacticidven:4) )No results found for this or any previous visit (from the past 240 hours).   Scheduled Meds:  enoxaparin  (LOVENOX ) injection  40 mg Subcutaneous Q24H   folic acid   1 mg Oral Daily   furosemide   40 mg Oral BID   multivitamin with minerals  1 tablet Oral Daily   potassium chloride   20 mEq Oral BID   spironolactone   12.5 mg Oral Daily   thiamine   100 mg Oral Daily   Or   thiamine   100 mg Intravenous Daily   Continuous Infusions:  Procedures/Studies: DG Chest Portable 1 View Result Date: 11/22/2024 EXAM: 1 VIEW(S) XRAY OF THE CHEST 11/22/2024 08:53:03 PM COMPARISON: 9 / 15 / 22. CLINICAL HISTORY: sob FINDINGS: LUNGS AND PLEURA: No focal pulmonary opacity. No pleural effusion. No pneumothorax. HEART AND MEDIASTINUM: Cardiomegaly. BONES AND SOFT TISSUES: No acute osseous abnormality. IMPRESSION: 1. No acute cardiopulmonary process. Cardiomegaly. Electronically signed by: Norman Gatlin MD 11/22/2024 09:12 PM EST RP Workstation: [id number]    Alm Schneider, DO  [facility]  If 7PM-7AM, please contact  night-coverage [email or web address] Password [email or web address] 11/23/2024, 2:00 PM   LOS: 0 days

## 2024-11-23 NOTE — Progress Notes (Signed)
 RedsClip attempted x2. Low Quality reading on both attempts.

## 2024-11-24 ENCOUNTER — Encounter (HOSPITAL_COMMUNITY): Payer: Self-pay | Admitting: Internal Medicine

## 2024-11-24 DIAGNOSIS — I351 Nonrheumatic aortic (valve) insufficiency: Secondary | ICD-10-CM

## 2024-11-24 DIAGNOSIS — I340 Nonrheumatic mitral (valve) insufficiency: Secondary | ICD-10-CM

## 2024-11-24 DIAGNOSIS — Z720 Tobacco use: Secondary | ICD-10-CM | POA: Diagnosis not present

## 2024-11-24 DIAGNOSIS — I5020 Unspecified systolic (congestive) heart failure: Secondary | ICD-10-CM | POA: Diagnosis not present

## 2024-11-24 DIAGNOSIS — I10 Essential (primary) hypertension: Secondary | ICD-10-CM | POA: Diagnosis not present

## 2024-11-24 DIAGNOSIS — F1020 Alcohol dependence, uncomplicated: Secondary | ICD-10-CM | POA: Diagnosis not present

## 2024-11-24 DIAGNOSIS — I5022 Chronic systolic (congestive) heart failure: Secondary | ICD-10-CM

## 2024-11-24 DIAGNOSIS — I5021 Acute systolic (congestive) heart failure: Secondary | ICD-10-CM | POA: Diagnosis not present

## 2024-11-24 HISTORY — DX: Nonrheumatic mitral (valve) insufficiency: I34.0

## 2024-11-24 HISTORY — DX: Nonrheumatic aortic (valve) insufficiency: I35.1

## 2024-11-24 LAB — COMPREHENSIVE METABOLIC PANEL WITH GFR
ALT: 74 U/L — ABNORMAL HIGH (ref 0–44)
AST: 47 U/L — ABNORMAL HIGH (ref 15–41)
Albumin: 3.4 g/dL — ABNORMAL LOW (ref 3.5–5.0)
Alkaline Phosphatase: 145 U/L — ABNORMAL HIGH (ref 38–126)
Anion gap: 9 (ref 5–15)
BUN: 19 mg/dL (ref 6–20)
CO2: 29 mmol/L (ref 22–32)
Calcium: 9.4 mg/dL (ref 8.9–10.3)
Chloride: 103 mmol/L (ref 98–111)
Creatinine, Ser: 0.91 mg/dL (ref 0.61–1.24)
GFR, Estimated: >60 mL/min (ref >60)
Glucose, Bld: 117 mg/dL — ABNORMAL HIGH (ref 70–99)
Potassium: 3.8 mmol/L (ref 3.5–5.1)
Sodium: 141 mmol/L (ref 135–145)
Total Bilirubin: 1 mg/dL (ref 0.0–1.2)
Total Protein: 6.1 g/dL — ABNORMAL LOW (ref 6.5–8.1)

## 2024-11-24 LAB — ECHOCARDIOGRAM COMPLETE
Area-P 1/2: 6.96 cm2
Height: 70 in
MV M vel: 5.7 m/s
MV Peak grad: 130 mmHg
P 1/2 time: 386 ms
Radius: 0.9 cm
S' Lateral: 5.8 cm
Weight: 2694.9 [oz_av]

## 2024-11-24 LAB — HEPATITIS PANEL, ACUTE
HCV Ab: NONREACTIVE
Hep A IgM: NONREACTIVE
Hep B C IgM: NONREACTIVE
Hepatitis B Surface Ag: NONREACTIVE

## 2024-11-24 LAB — MAGNESIUM: Magnesium: 1.8 mg/dL (ref 1.7–2.4)

## 2024-11-24 MED ORDER — LOSARTAN POTASSIUM 25 MG PO TABS
12.5000 mg | ORAL_TABLET | Freq: Every day | ORAL | Status: DC
  Administered 2024-11-24 – 2024-11-25 (×2): 12.5 mg via ORAL
  Filled 2024-11-24 (×2): qty 1

## 2024-11-24 MED ORDER — MAGNESIUM SULFATE 2 GM/50ML IV SOLN
2.0000 g | Freq: Once | INTRAVENOUS | Status: AC
  Administered 2024-11-24: 2 g via INTRAVENOUS
  Filled 2024-11-24: qty 50

## 2024-11-24 NOTE — Progress Notes (Signed)
 PROGRESS NOTE  Alejandro Lopez FMW:[id number] DOB: 03-19-1981 DOA: 11/22/2024 PCP: Patient, No Pcp Per  Brief History:  43 y/o male with a history of hypertension, tobacco use, alcohol use and asthma presenting with worsening lower extremity edema and swelling of his abdomen and groin.  Patient has also been complaining of shortness of breath and nonproductive cough for since around Halloween time.  He has orthopnea type symptoms.  Patient states that he recently completed a course of azithromycin  for pneumonia on 11/18/24.  Since then, the patient states that his lower extremity edema and shortness of breath have worsened. He drinks 5 drinks of liquor daily and continues to smoke cigarettes.  Denies any fevers, chills, chest pain, abdominal pain, nausea, vomiting, diarrhea.  In the ED, the patient was afebrile hemodynamic stable with oxygen  saturation 97-99% room air.  WBC 10.6, hemoglobin 15.1, platelets 206.  AST 71, ALT 100, alk phosphatase 148, total bilirubin 1.3.  Sodium 141, potassium 3.8, bicarbonate 28, serum creatinine 0.89.  TSH 1.860.  UA was negative for pyuria.  proBNP 3354.  Troponin 27>> 27.  Chest x-ray was negative for edema or infiltrates.  EKG showed sinus rhythm with nonspecific T wave change.  Patient was started on IV furosemide.  Cardiology was consulted for management.   Assessment/Plan:  Acute HFrEF - 11/23/24 Echo--EF 35-40%, G3DD, severe MR, AI - Continue IV furosemide - Daily weights--neg 9 lbs - Initial proBNP 3354 - Accurate I's and O's--NEG 5.6L - Holding carvedilol - Appreciate cardiology consult>>transfer to [facility] for advanced CHF team to consult - spironolactone added   Essential hypertension - Holding carvedilol - Discontinue lisinopril for now   Transaminasemia - Secondary to alcohol use and hepatic congestion -RUQ US --neg - Hepatitis panel--neg   Alcohol use - Alcohol withdrawal protocol - no signs of withdrawal   Tobacco  abuse - Cessation discussed   Elevated troponin -Secondary to demand ischemia -not consistent with ACS           Family Communication:   wife at bedside 12/2   Consultants:  cardiology   Code Status:  FULL    DVT Prophylaxis:  [place] Lovenox     Procedures: As Listed in Progress Note Above   Antibiotics: None      Subjective: Patient denies fevers, chills, headache, chest pain, dyspnea, nausea, vomiting, diarrhea, abdominal pain, dysuria, hematuria, hematochezia, and melena.   Objective: Vitals:   11/23/24 1622 11/23/24 1930 11/23/24 2317 11/24/24 0321  BP: 117/82 129/86 112/80 (!) 127/90  Pulse: (!) 103 (!) 103 (!) 103 99  Resp: 18     Temp: 97.7 F (36.5 C) 98.7 F (37.1 C) 98.8 F (37.1 C) 98.6 F (37 C)  TempSrc: Oral Oral Oral Oral  SpO2: 100% 97% 97% 99%  Weight:    75 kg  Height:        Intake/Output Summary (Last 24 hours) at 11/24/2024 1018 Last data filed at 11/24/2024 0847 Gross per 24 hour  Intake 960 ml  Output 3000 ml  Net -2040 ml   Weight change: -4.1 kg Exam:  General:  Pt is alert, follows commands appropriately, not in acute distress HEENT: No icterus, No thrush, No neck mass, [place]/AT Cardiovascular: RRR, S1/S2, no rubs, no gallops Respiratory: CTA bilaterally, no wheezing, no crackles, no rhonchi Abdomen: Soft/+BS, non tender, non distended, no guarding Extremities: 1 + LE edema, No lymphangitis, No petechiae, No rashes, no synovitis   Data Reviewed: I  have personally reviewed following labs and imaging studies Basic Metabolic Panel: Recent Labs  Lab 11/22/24 1958 11/23/24 0436 11/24/24 0510  NA 139 141 141  K 4.0 3.8 3.8  CL 106 104 103  CO2 20* 28 29  GLUCOSE 107* 91 117*  BUN 14 13 19   CREATININE 0.90 0.89 0.91  CALCIUM 9.2 9.5 9.4  MG  --   --  1.8   Liver Function Tests: Recent Labs  Lab 11/22/24 1958 11/23/24 0436 11/24/24 0510  AST 71* 67* 47*  ALT 100* 97* 74*  ALKPHOS 148* 157* 145*  BILITOT 1.3* 1.6*  1.0  PROT 6.7 6.8 6.1*  ALBUMIN 3.8 3.7 3.4*   No results for input(s): LIPASE, AMYLASE in the last 168 hours. No results for input(s): AMMONIA in the last 168 hours. Coagulation Profile: Recent Labs  Lab 11/23/24 0436  INR 1.1   CBC: Recent Labs  Lab 11/22/24 1958  WBC 10.6*  NEUTROABS 5.6  HGB 15.1  HCT 44.2  MCV 96.9  PLT 206   Cardiac Enzymes: No results for input(s): CKTOTAL, CKMB, CKMBINDEX, TROPONINI in the last 168 hours. BNP: Invalid input(s): POCBNP CBG: No results for input(s): GLUCAP in the last 168 hours. HbA1C: No results for input(s): HGBA1C in the last 72 hours. Urine analysis:    Component Value Date/Time   COLORURINE AMBER (A) 11/22/2024 2022   APPEARANCEUR CLEAR 11/22/2024 2022   LABSPEC 1.025 11/22/2024 2022   PHURINE 5.0 11/22/2024 2022   GLUCOSEU NEGATIVE 11/22/2024 2022   HGBUR NEGATIVE 11/22/2024 2022   BILIRUBINUR NEGATIVE 11/22/2024 2022   KETONESUR NEGATIVE 11/22/2024 2022   PROTEINUR >=300 (A) 11/22/2024 2022   NITRITE NEGATIVE 11/22/2024 2022   LEUKOCYTESUR NEGATIVE 11/22/2024 2022   Sepsis Labs: @LABRCNTIP (procalcitonin:4,lacticidven:4) )No results found for this or any previous visit (from the past 240 hours).   Scheduled Meds:  enoxaparin  (LOVENOX ) injection  40 mg Subcutaneous Q24H   folic acid   1 mg Oral Daily   furosemide   40 mg Intravenous BID   multivitamin with minerals  1 tablet Oral Daily   potassium chloride   20 mEq Oral BID   spironolactone   12.5 mg Oral Daily   thiamine   100 mg Oral Daily   Or   thiamine   100 mg Intravenous Daily   Continuous Infusions:  Procedures/Studies: US  Abdomen Limited RUQ (LIVER/GB) Result Date: 11/23/2024 CLINICAL DATA:  Transaminasemia. The patient ate a cheeseburger 30 minutes prior to the examination. EXAM: ULTRASOUND ABDOMEN LIMITED RIGHT UPPER QUADRANT COMPARISON:  None Available. FINDINGS: Gallbladder: Poorly distended with no gallstones or wall thickening  visualized. No sonographic Murphy sign noted by sonographer. Common bile duct: Diameter: 2.7 mm Liver: 8 mm simple cyst in the dome of the liver. This does not need imaging follow-up. Within normal limits in parenchymal echogenicity. Portal vein is patent on color Doppler imaging with normal direction of blood flow towards the liver. Other: None. IMPRESSION: No significant abnormality. Electronically Signed   By: Elspeth Bathe M.D.   On: 11/23/2024 15:39   ECHOCARDIOGRAM COMPLETE Result Date: 11/23/2024    ECHOCARDIOGRAM REPORT   Patient Name:   Alejandro Lopez Date of Exam: 11/23/2024 Medical Rec #:  [id number]        Height:       70.0 in Accession #:    [id number]       Weight:       168.4 lb Date of Birth:  06/05/1981       BSA:  1.940 m Patient Age:    43 years         BP:           125/83 mmHg Patient Gender: M                HR:           99 bpm. Exam Location:  [facility] Procedure: 2D Echo, 3D Echo, Cardiac Doppler and Color Doppler (Both Spectral            and Color Flow Doppler were utilized during procedure). Indications:    Congestive Heart Failure I50.9  History:        Patient has no prior history of Echocardiogram examinations.                 CHF; Risk Factors:Hypertension and Current Smoker. Hx of ETOH                 abuse.  Sonographer:    Aida Pizza RCS Referring Phys: [id number] GLENYS RAMAN PRATT IMPRESSIONS  1. Left ventricular ejection fraction, by estimation, is 35 to 40%. Left ventricular ejection fraction by 3D volume is 40 %. The left ventricle has moderately decreased function. The left ventricle demonstrates global hypokinesis. The left ventricular internal cavity size was severely dilated. There is mild concentric left ventricular hypertrophy. Left ventricular diastolic parameters are consistent with Grade III diastolic dysfunction (restrictive). Elevated left atrial pressure.  2. Right ventricular systolic function is severely reduced. The right ventricular size is normal. There  is moderately elevated pulmonary artery systolic pressure. The estimated right ventricular systolic pressure is 48.2 mmHg.  3. Left atrial size was moderately dilated.  4. Right atrial size was mildly dilated.  5. A small pericardial effusion is present. The pericardial effusion is localized near the right atrium.  6. The mitral valve is abnormal, mildly thickened and with incomplete coaptation secondary to annular dilatation. Severe mitral valve regurgitation, posteriorly directed.  7. Tricuspid valve regurgitation is moderate.  8. The aortic valve is tricuspid. Aortic valve regurgitation is severe. Vena contracta 0.9 cm.  9. The inferior vena cava is dilated in size with >50% respiratory variability, suggesting right atrial pressure of 8 mmHg. Comparison(s): No prior Echocardiogram. FINDINGS  Left Ventricle: Left ventricular ejection fraction, by estimation, is 35 to 40%. Left ventricular ejection fraction by 3D volume is 40 %. The left ventricle has moderately decreased function. The left ventricle demonstrates global hypokinesis. The left ventricular internal cavity size was severely dilated. There is mild concentric left ventricular hypertrophy. Left ventricular diastolic parameters are consistent with Grade III diastolic dysfunction (restrictive). Elevated left atrial pressure. Right Ventricle: The right ventricular size is normal. No increase in right ventricular wall thickness. Right ventricular systolic function is severely reduced. There is moderately elevated pulmonary artery systolic pressure. The tricuspid regurgitant velocity is 3.17 m/s, and with an assumed right atrial pressure of 8 mmHg, the estimated right ventricular systolic pressure is 48.2 mmHg. Left Atrium: Left atrial size was moderately dilated. Right Atrium: Right atrial size was mildly dilated. Pericardium: A small pericardial effusion is present. The pericardial effusion is localized near the right atrium. Mitral Valve: The mitral valve  is abnormal. There is mild thickening of the mitral valve leaflet(s). Severe mitral valve regurgitation, with eccentric posteriorly directed jet. Tricuspid Valve: The tricuspid valve is grossly normal. Tricuspid valve regurgitation is moderate. Aortic Valve: The aortic valve is tricuspid. Aortic valve regurgitation is severe. Aortic regurgitation PHT measures 386 msec. Pulmonic Valve: The  pulmonic valve was grossly normal. Pulmonic valve regurgitation is trivial. Aorta: The aortic root is normal in size and structure. Venous: The inferior vena cava is dilated in size with greater than 50% respiratory variability, suggesting right atrial pressure of 8 mmHg. IAS/Shunts: No atrial level shunt detected by color flow Doppler. Additional Comments: 3D was performed not requiring image post processing on an independent workstation and was abnormal.  LEFT VENTRICLE PLAX 2D LVIDd:         7.20 cm         Diastology LVIDs:         5.80 cm         LV e' medial:    6.71 cm/s LV PW:         1.20 cm         LV E/e' medial:  20.1 LV IVS:        1.10 cm         LV e' lateral:   9.32 cm/s LVOT diam:     2.00 cm         LV E/e' lateral: 14.5 LV SV:         53 LV SV Index:   27 LVOT Area:     3.14 cm        3D Volume EF                                LV 3D EF:    Left                                             ventricul                                             ar                                             ejection                                             fraction                                             by 3D                                             volume is                                             40 %.  3D Volume EF:                                3D EF:        40 %                                LV EDV:       306 ml                                LV ESV:       184 ml                                LV SV:        123 ml RIGHT VENTRICLE TAPSE (M-mode): 1.5 cm LEFT ATRIUM               Index        RIGHT ATRIUM           Index LA diam:        4.60 cm  2.37 cm/m   RA Area:     21.00 cm LA Vol (A2C):   137.0 ml 70.61 ml/m  RA Volume:   70.10 ml  36.13 ml/m LA Vol (A4C):   74.3 ml  38.29 ml/m LA Biplane Vol: 108.0 ml 55.66 ml/m  AORTIC VALVE LVOT Vmax:   100.00 cm/s LVOT Vmean:  76.200 cm/s LVOT VTI:    0.168 m AI PHT:      386 msec  AORTA Ao Root diam: 3.60 cm MITRAL VALVE                  TRICUSPID VALVE MV Area (PHT): 6.96 cm       TR Peak grad:   40.2 mmHg MV Decel Time: 109 msec       TR Vmax:        317.00 cm/s MR Peak grad:    130.0 mmHg MR Mean grad:    80.0 mmHg    SHUNTS MR Vmax:         570.00 cm/s  Systemic VTI:  0.17 m MR Vmean:        423.0 cm/s   Systemic Diam: 2.00 cm MR PISA:         5.09 cm MR PISA Eff ROA: 27 mm MR PISA Radius:  0.90 cm MV E velocity: 135.00 cm/s MV A velocity: 62.20 cm/s MV E/A ratio:  2.17 Jayson Sierras MD Electronically signed by Jayson Sierras MD Signature Date/Time: 11/23/2024/3:17:40 PM    Final    DG Chest Portable 1 View Result Date: 11/22/2024 EXAM: 1 VIEW(S) XRAY OF THE CHEST 11/22/2024 08:53:03 PM COMPARISON: 9 / 15 / 22. CLINICAL HISTORY: sob FINDINGS: LUNGS AND PLEURA: No focal pulmonary opacity. No pleural effusion. No pneumothorax. HEART AND MEDIASTINUM: Cardiomegaly. BONES AND SOFT TISSUES: No acute osseous abnormality. IMPRESSION: 1. No acute cardiopulmonary process. Cardiomegaly. Electronically signed by: Norman Gatlin MD 11/22/2024 09:12 PM EST RP Workstation: [id number]    Alm Schneider, DO  [facility]  If 7PM-7AM, please contact night-coverage [email or web address] Password [email or web address] 11/24/2024, 10:18 AM   LOS: 1 day

## 2024-11-24 NOTE — Consult Note (Signed)
 Advanced Heart Failure Team Consult Note   Primary Physician: Patient, No Pcp Per Cardiologist:  Dr. Debera (new)   Reason for Consultation: Acute systolic heart failure  HPI:    Alejandro Lopez is seen today for evaluation of acute systolic heart failure w/ biventricular dysfunction at the request of Dr. Debera, Cardiology.   43 y.o. male w/ h/o heavy alcohol use (at least 5 drinks per evening) and h/o asthma admitted w/ acute systolic heart failure. Strong family history of cardiac disease: mother and maternal grandmother had heart failure and father with CAD.  He presented w/ complaints of worsening dyspnea/orthopnea and LEE, which has progressed over the last several wks. Initial treated for PNA but had no improvement in symptoms w/ abx.   In ED, proBNP 3354. CXR showed cardiomegaly but no frank edema. EKG ST 114 bpm w/ anteroseptal Q waves and LAE.   Labs: Na 139, K 4.0, CO2 20, Gluc 107, BUN 14, SCr 0.90, AST 71, ALT 100, Tbili 1.3, Hepatitis panel negative, Hs trop 27>>27   UA + proteinuria, otherwise unremarkable.   HIV NR   RUQ US  no significant abnormality.   He was admitted and started on IV Lasix . Echo was done, showing biventricular failure, LVEF 35-40%, mild concentric LVH w/ GIIIDD (restrictive), RV severely reduced, severe MR posteriorly directed, mod TR, severe AI w/ nodular thickening at the leaflet tips and avariable echodensity of the aortic valve (Cannot exclude vegetation or leaflet tip). WBC 10.6 on admit but no fever/chills.   Pt now transferred from [facility] for further management and AHF team consultation.  He has diuresed well and is feeling much better since coming in. Is now able to lay flat and ambulate without SOB. Had felt in his normal state of health 2 months ago, however developed dyspnea, not resolved with inhalers and eventually edema. Reports that he drinks 5 mini bottles 6 days a week. Current smoker.   Home Medications Prior to Admission  medications   Medication Sig Start Date End Date Taking? Authorizing Provider  albuterol  (VENTOLIN  HFA) 108 (90 Base) MCG/ACT inhaler Inhale 2 puffs into the lungs every 4 (four) hours as needed. 10/14/23  Yes Leath-Warren, Etta PARAS, NP  guaiFENesin (MUCINEX) 600 MG 12 hr tablet Take 1,200 mg by mouth 2 (two) times daily as needed for to loosen phlegm or cough.   Yes [provider]  ibuprofen  (ADVIL ) 800 MG tablet Take 1 tablet (800 mg total) by mouth every 8 (eight) hours as needed for moderate pain. 11/07/22  Yes Zammit, Joseph, MD  azithromycin  (ZITHROMAX ) 250 MG tablet Take 250 mg by mouth as directed. Patient not taking: Reported on 11/23/2024 11/14/24   [provider]    Past Medical History: Past Medical History:  Diagnosis Date   Asthma     Past Surgical History: Past Surgical History:  Procedure Laterality Date   FRACTURE SURGERY      Family History: Family History  Problem Relation Age of Onset   Stroke Mother    Hypertension Mother    Diabetes Mother     Social History: Social History   Socioeconomic History   Marital status: Married    Spouse name: Not on file   Number of children: 3   Years of education: Not on file   Highest education level: 9th grade  Occupational History   Not on file  Tobacco Use   Smoking status: Every Day    Current packs/day: 1.00    Types: Cigarettes  Smokeless tobacco: Never  Vaping Use   Vaping status: Never Used  Substance and Sexual Activity   Alcohol use: Yes    Alcohol/week: 4.0 standard drinks of alcohol    Types: 4 Shots of liquor per week    Comment: every day    Drug use: Yes    Types: Marijuana   Sexual activity: Not on file  Other Topics Concern   Not on file  Social History Narrative   Not on file   Social Drivers of Health   Financial Resource Strain: Not on file  Food Insecurity: No Food Insecurity (11/22/2024)   Hunger Vital Sign    Worried About Running Out of Food in the Last  Year: Never true    Ran Out of Food in the Last Year: Never true  Transportation Needs: No Transportation Needs (11/22/2024)   PRAPARE - Administrator, Civil Service (Medical): No    Lack of Transportation (Non-Medical): No  Physical Activity: Not on file  Stress: Not on file  Social Connections: Unknown (05/06/2022)   [facility] from [facility]   Social Network    Social Network: Not on file    Allergies:  No Known Allergies  Objective:    Vital Signs:   Temp:  [97.9 F (36.6 C)-98.3 F (36.8 C)] 98.3 F (36.8 C) (12/04 0437) Pulse Rate:  [97-102] 99 (12/04 0437) Resp:  [18-27] 18 (12/04 0437) BP: (109-132)/(75-87) 112/81 (12/04 0437) SpO2:  [96 %-100 %] 97 % (12/04 0437) Weight:  [71.9 kg] 71.9 kg (12/04 0449) Last BM Date : 11/25/24 (per pt self-report)  Weight change: Filed Weights   11/25/24 0310 11/26/24 0027 11/26/24 0449  Weight: 72.9 kg 71.9 kg 71.9 kg   Intake/Output:  Intake/Output Summary (Last 24 hours) at 11/26/2024 0729 Last data filed at 11/26/2024 0045 Gross per 24 hour  Intake 1400 ml  Output 3925 ml  Net -2525 ml    Physical Exam   General: Well appearing. No distress  Cardiac: JVP ~~8cm cm. Murmurs present Extremities: Warm and dry.  No peripheral edema.  Neuro: A&O x3. Affect pleasant.   Telemetry   SR 100-110s (personally reviewed)  EKG    Sinus tach 115 bpm, LAE, anteroseptal Q waves   Labs   Basic Metabolic Panel: Recent Labs  Lab 11/22/24 1958 11/23/24 0436 11/24/24 0510 11/25/24 0305 11/26/24 0406  NA 139 141 141 141 140  K 4.0 3.8 3.8 3.6 3.8  CL 106 104 103 103 101  CO2 20* 28 29 30  33*  GLUCOSE 107* 91 117* 96 108*  BUN 14 13 19 20 18   CREATININE 0.90 0.89 0.91 0.92 1.09  CALCIUM 9.2 9.5 9.4 9.2 9.0  MG  --   --  1.8 1.9  --     Liver Function Tests: Recent Labs  Lab 11/22/24 1958 11/23/24 0436 11/24/24 0510 11/25/24 0305  AST 71* 67* 47* 41  ALT 100* 97* 74* 64*  ALKPHOS 148* 157* 145*  141*  BILITOT 1.3* 1.6* 1.0 0.8  PROT 6.7 6.8 6.1* 5.9*  ALBUMIN 3.8 3.7 3.4* 3.3*   No results for input(s): LIPASE, AMYLASE in the last 168 hours. No results for input(s): AMMONIA in the last 168 hours.  CBC: Recent Labs  Lab 11/22/24 1958  WBC 10.6*  NEUTROABS 5.6  HGB 15.1  HCT 44.2  MCV 96.9  PLT 206   ProBNP (last 3 results) Recent Labs    11/22/24 1958  PROBNP 3,354.0*   Medications:  Current Medications:  enoxaparin (LOVENOX) injection  40 mg Subcutaneous Q24H   folic acid  1 mg Oral Daily   furosemide  40 mg Intravenous BID   losartan  25 mg Oral Daily   multivitamin with minerals  1 tablet Oral Daily   potassium chloride  20 mEq Oral BID   spironolactone  25 mg Oral Daily   thiamine  100 mg Oral Daily   Or   thiamine  100 mg Intravenous Daily    Infusions:      Patient Profile   43 y/o male w/ h/o heavy alcohol use (at least 5 drinks per evening) and h/o asthma admitted w/ acute systolic heart failure.  Assessment/Plan   1. Acute Systolic Heart Failure w/ Biventricular Dysfunction  - new diagnosis - Echo EF 34-50%, GIIIDD (restrictive), RV mod reduced, IVC dilated - HS trop not c/w ACS but EKG w/ anteroseptal Qs  - continue diuresis w/ IV Lasix  - Plan [facility] tomorrow - cMRI today; may need TEE - increase losartan to 25 mg daily, eventually switch to entresto - increase spironolactone to 25 mg daily  - check A1c, start jardiance 10 mg daily - no ? blocker with tachycardia, suspect compensatory, add at follow up  2. Valvular Disease  - severe MR posteriorly directed, mod TR. Likely functional. HF optimization per above - severe AI, ? Echodensity/vegetation on AoV leaflet. No symptoms of systemic infection. Bcx NGTD. - CMR need TEE to better assess post diuresis    3. Transaminates - AST/ALT 71/100. Improving - Hepatitis panel negative. RUQ US  unremarkable  - suspect 2/2 CHF/hepatic congestion + chronic ETOH use  - continue  diuresis - follow trend   4. ETOH Abuse - heavy drinker, at least 5 drinks per evening - may be contributing to CM, reduction of intake imperative  - CIWA protocol ordered   5. Hypomagnesemia - follow; goal >2   Length of Stay: 3  Jordan Lee, NP  11/26/2024, 7:29 AM  Advanced Heart Failure Team Pager [phone] (M-F; 7a - 5p)  Please contact [facility] for night-coverage after hours (4p -7a ) and weekends on [email or web address]

## 2024-11-24 NOTE — H&P (View-Only) (Signed)
 Advanced Heart Failure Team Consult Note   Primary Physician: Patient, No Pcp Per Cardiologist:  Dr. Debera (new)   Reason for Consultation: Acute systolic heart failure  HPI:    Alejandro Lopez is seen today for evaluation of acute systolic heart failure w/ biventricular dysfunction at the request of Dr. Debera, Cardiology.   43 y.o. male w/ h/o heavy alcohol use (at least 5 drinks per evening) and h/o asthma admitted w/ acute systolic heart failure. Strong family history of cardiac disease: mother and maternal grandmother had heart failure and father with CAD.  He presented w/ complaints of worsening dyspnea/orthopnea and Alejandro Lopez, which has progressed over the last several wks. Initial treated for PNA but had no improvement in symptoms w/ abx.   In ED, proBNP 3354. CXR showed cardiomegaly but no frank edema. EKG ST 114 bpm w/ anteroseptal Q waves and LAE.   Labs: Na 139, K 4.0, CO2 20, Gluc 107, BUN 14, SCr 0.90, AST 71, ALT 100, Tbili 1.3, Hepatitis panel negative, Hs trop 27>>27   UA + proteinuria, otherwise unremarkable.   HIV NR   RUQ US  no significant abnormality.   He was admitted and started on IV Lasix . Echo was done, showing biventricular failure, LVEF 35-40%, mild concentric LVH w/ GIIIDD (restrictive), RV severely reduced, severe MR posteriorly directed, mod TR, severe AI w/ nodular thickening at the leaflet tips and avariable echodensity of the aortic valve (Cannot exclude vegetation or leaflet tip). WBC 10.6 on admit but no fever/chills.   Pt now transferred from [facility] for further management and AHF team consultation.  He has diuresed well and is feeling much better since coming in. Is now able to lay flat and ambulate without SOB. Had felt in his normal state of health 2 months ago, however developed dyspnea, not resolved with inhalers and eventually edema. Reports that he drinks 5 mini bottles 6 days a week. Current smoker.   Home Medications Prior to Admission  medications   Medication Sig Start Date End Date Taking? Authorizing Provider  albuterol  (VENTOLIN  HFA) 108 (90 Base) MCG/ACT inhaler Inhale 2 puffs into the lungs every 4 (four) hours as needed. 10/14/23  Yes Leath-Warren, Etta PARAS, NP  guaiFENesin (MUCINEX) 600 MG 12 hr tablet Take 1,200 mg by mouth 2 (two) times daily as needed for to loosen phlegm or cough.   Yes [provider]  ibuprofen  (ADVIL ) 800 MG tablet Take 1 tablet (800 mg total) by mouth every 8 (eight) hours as needed for moderate pain. 11/07/22  Yes Zammit, Joseph, MD  azithromycin  (ZITHROMAX ) 250 MG tablet Take 250 mg by mouth as directed. Patient not taking: Reported on 11/23/2024 11/14/24   [provider]    Past Medical History: Past Medical History:  Diagnosis Date   Asthma     Past Surgical History: Past Surgical History:  Procedure Laterality Date   FRACTURE SURGERY      Family History: Family History  Problem Relation Age of Onset   Stroke Mother    Hypertension Mother    Diabetes Mother     Social History: Social History   Socioeconomic History   Marital status: Married    Spouse name: Not on file   Number of children: 3   Years of education: Not on file   Highest education level: 9th grade  Occupational History   Not on file  Tobacco Use   Smoking status: Every Day    Current packs/day: 1.00    Types: Cigarettes  Smokeless tobacco: Never  Vaping Use   Vaping status: Never Used  Substance and Sexual Activity   Alcohol use: Yes    Alcohol/week: 4.0 standard drinks of alcohol    Types: 4 Shots of liquor per week    Comment: every day    Drug use: Yes    Types: Marijuana   Sexual activity: Not on file  Other Topics Concern   Not on file  Social History Narrative   Not on file   Social Drivers of Health   Financial Resource Strain: Not on file  Food Insecurity: No Food Insecurity (11/22/2024)   Hunger Vital Sign    Worried About Running Out of Food in the Last  Year: Never true    Ran Out of Food in the Last Year: Never true  Transportation Needs: No Transportation Needs (11/22/2024)   PRAPARE - Administrator, Civil Service (Medical): No    Lack of Transportation (Non-Medical): No  Physical Activity: Not on file  Stress: Not on file  Social Connections: Unknown (05/06/2022)   [facility]   Social Network    Social Network: Not on file    Allergies:  No Known Allergies  Objective:    Vital Signs:   Temp:  [97.9 F (36.6 C)-98.3 F (36.8 C)] 98.3 F (36.8 C) (12/04 0437) Pulse Rate:  [97-102] 99 (12/04 0437) Resp:  [18-27] 18 (12/04 0437) BP: (109-132)/(75-87) 112/81 (12/04 0437) SpO2:  [96 %-100 %] 97 % (12/04 0437) Weight:  [71.9 kg] 71.9 kg (12/04 0449) Last BM Date : 11/25/24 (per pt self-report)  Weight change: Filed Weights   11/25/24 0310 11/26/24 0027 11/26/24 0449  Weight: 72.9 kg 71.9 kg 71.9 kg   Intake/Output:  Intake/Output Summary (Last 24 hours) at 11/26/2024 0729 Last data filed at 11/26/2024 0045 Gross per 24 hour  Intake 1400 ml  Output 3925 ml  Net -2525 ml    Physical Exam   General: Well appearing. No distress  Cardiac: JVP ~~8cm cm. Murmurs present Extremities: Warm and dry.  No peripheral edema.  Neuro: A&O x3. Affect pleasant.   Telemetry   SR 100-110s (personally reviewed)  EKG    Sinus tach 115 bpm, LAE, anteroseptal Q waves   Labs   Basic Metabolic Panel: Recent Labs  Lab 11/22/24 1958 11/23/24 0436 11/24/24 0510 11/25/24 0305 11/26/24 0406  NA 139 141 141 141 140  K 4.0 3.8 3.8 3.6 3.8  CL 106 104 103 103 101  CO2 20* 28 29 30  33*  GLUCOSE 107* 91 117* 96 108*  BUN 14 13 19 20 18   CREATININE 0.90 0.89 0.91 0.92 1.09  CALCIUM 9.2 9.5 9.4 9.2 9.0  MG  --   --  1.8 1.9  --     Liver Function Tests: Recent Labs  Lab 11/22/24 1958 11/23/24 0436 11/24/24 0510 11/25/24 0305  AST 71* 67* 47* 41  ALT 100* 97* 74* 64*  ALKPHOS 148* 157* 145*  141*  BILITOT 1.3* 1.6* 1.0 0.8  PROT 6.7 6.8 6.1* 5.9*  ALBUMIN 3.8 3.7 3.4* 3.3*   No results for input(s): LIPASE, AMYLASE in the last 168 hours. No results for input(s): AMMONIA in the last 168 hours.  CBC: Recent Labs  Lab 11/22/24 1958  WBC 10.6*  NEUTROABS 5.6  HGB 15.1  HCT 44.2  MCV 96.9  PLT 206   ProBNP (last 3 results) Recent Labs    11/22/24 1958  PROBNP 3,354.0*   Medications:  Current Medications:  enoxaparin  (LOVENOX ) injection  40 mg Subcutaneous Q24H   folic acid   1 mg Oral Daily   furosemide   40 mg Intravenous BID   losartan   25 mg Oral Daily   multivitamin with minerals  1 tablet Oral Daily   potassium chloride   20 mEq Oral BID   spironolactone   25 mg Oral Daily   thiamine   100 mg Oral Daily   Or   thiamine   100 mg Intravenous Daily    Infusions:      Patient Profile   43 y/o male w/ h/o heavy alcohol use (at least 5 drinks per evening) and h/o asthma admitted w/ acute systolic heart failure.  Assessment/Plan   1. 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 blocker with tachycardia, suspect compensatory, add at follow up  2. Valvular Disease  - severe MR posteriorly directed, mod TR. Likely functional. HF optimization per above - severe AI, ? Echodensity/vegetation on AoV leaflet. No symptoms of systemic infection. Bcx NGTD. - CMR need TEE to better assess post diuresis    3. Transaminates - AST/ALT 71/100. Improving - Hepatitis panel negative. RUQ US  unremarkable  - suspect 2/2 CHF/hepatic congestion + chronic ETOH use  - continue  diuresis - follow trend   4. ETOH Abuse - heavy drinker, at least 5 drinks per evening - may be contributing to CM, reduction of intake imperative  - CIWA protocol ordered   5. Hypomagnesemia - follow; goal >2   Length of Stay: 3  Munir Victorian, NP  11/26/2024, 7:29 AM  Advanced Heart Failure Team Pager [phone] (M-F; 7a - 5p)  Please contact [facility] for night-coverage after hours (4p -7a ) and weekends on [email or web address]

## 2024-11-24 NOTE — Plan of Care (Signed)

## 2024-11-24 NOTE — Progress Notes (Addendum)
 Heart Failure Nurse Navigator Progress Note  PCP: Patient, No Pcp Per PCP-Cardiologist: Jayson Sierras, MD Admission Diagnosis: Acute congestive heart failure, unspecified heart failure type [facility]) Admitted from: Home  [facility]:  Patient called remotely to his room 203 at [facility].  2 Patient identifiers confirmed prior to telephone conversation.  Provided him with Heart Failure Education and Heart Failure Clinic follow-up appointment @ [facility] 11/30/24 @ 3:30 PM.   Presentation:   Alejandro Lopez is a 43 y.o. male.  He presented with shortness of breath that had worsened. Edema to both lower extremities, groin and abdomen. Reported a fullness sensation in his lower back as well.  Endorsed persistent shortness of breath which worsens when he lies flat.  Non productive cough and rapid heart rate with ambulation.  He smokes cigarettes daily and also drinks 4-5 ETOH beverages per day and also energy drinks.  Recent illness with pneumonia.  Received a steroid and antibiotics.  Prior history of asthma and past treatment for Hypertension  many years ago but does not go to the doctor anymore.  HS-Troponin 27. Pro-BNP 3,354. Chest x-ray No acute cardiopulmonary process. Cardiomegaly.  ECHO/ LVEF: 35-40%-Left ventricle has moderately decreased function.  LV global hypokinesis. LV internal cavity size was severely dilated. Mild concentric left ventricular hypertrophy.  Grade III Diastolic Dysfunction (restrictive). Right ventricular systolic function is  Severely reduced.  Left atrial size ws moderately dilated.  Right atrial size mildly dilated. Small pericardial effusion present near the right atrium.  The MV is abnormal.  Tricuspid valve regurgitation moderate.  Aortic valve regurgitation severe. The inferior vena cava is dilated ion size with > 50% respiratory variability, suggesting right atrial pressure of 8 mmHG. Clinical Course:  Past Medical History:  Diagnosis  Date   Asthma      Social History   Socioeconomic History   Marital status: Married    Spouse name: Not on file   Number of children: 3   Years of education: Not on file   Highest education level: 9th grade  Occupational History   Not on file  Tobacco Use   Smoking status: Every Day    Current packs/day: 1.00    Types: Cigarettes   Smokeless tobacco: Never  Vaping Use   Vaping status: Never Used  Substance and Sexual Activity   Alcohol use: Yes    Alcohol/week: 4.0 standard drinks of alcohol    Types: 4 Shots of liquor per week    Comment: every day    Drug use: Yes    Types: Marijuana   Sexual activity: Not on file  Other Topics Concern   Not on file  Social History Narrative   Not on file   Social Drivers of Health   Financial Resource Strain: Not on file  Food Insecurity: No Food Insecurity (11/22/2024)   Hunger Vital Sign    Worried About Running Out of Food in the Last Year: Never true    Ran Out of Food in the Last Year: Never true  Transportation Needs: No Transportation Needs (11/22/2024)   PRAPARE - Administrator, Civil Service (Medical): No    Lack of Transportation (Non-Medical): No  Physical Activity: Not on file  Stress: Not on file  Social Connections: Unknown (05/06/2022)   Received from [facility]   Social Network    Social Network: Not on file   Education Assessment and Provision:  Detailed education and instructions provided on heart failure disease management  including the following:  Signs and symptoms of Heart Failure When to call the physician Importance of daily weights Low sodium diet Fluid restriction Medication management Anticipated future follow-up appointments  Patient education given on each of the above topics.  Patient acknowledges understanding via teach back method and acceptance of all instructions.  Education Materials:  Living Better With Heart Failure Booklet, HF zone tool, & Daily Weight Tracker  Tool.  Patient has scale at home: Yes Patient has pill box at home: No. Will provide him one for discharge.  High Risk Criteria for Readmission and/or Poor Patient Outcomes: Heart failure hospital admissions (last 6 months): 1  No Show rate: 20% Difficult social situation: None determined.. Wife drives him to appointments.  Works as a lobbyist. Demonstrates medication adherence: Yes Primary Language: English Literacy level: 9th grade education.  Reading, Writing & Comprehension  Barriers of Care:   Daily weights-he has a scale but has never done daily weights Diet & Fluid Restrictions-current diet consists of pizza, french fries, hotdogs, monster drinks, pepsi and also said energy pills. New Heart Failure Medications ETOH, Tobacco and MJ use  Considerations/Referrals:  Referral made to Heart Failure Pharmacist Stewardship: N/A Referral made to Heart Failure CSW/NCM TOC: No Referral made to Heart & Vascular TOC clinic: Yes. [facility] 11/30/2024 @ 3:30 PM  Items for Follow-up on DC/TOC: Daily weights Diet & Fluid Restrictions ETOH, Tobacco and MJ use New Heart Failure Medications & Education  Charmaine Pines, RN, BSN [facility] Heart Failure Navigator Secure Chat Only

## 2024-11-24 NOTE — Progress Notes (Signed)
 Progress Note  Patient Name: Alejandro Lopez Date of Encounter: 11/24/2024  Primary Cardiologist: New to [facility]  Interval Summary  Chart reviewed.  Net urine output of approximately 2300 cc last 24 hours.  Renal function stable with creatinine 0.91.  Vital Signs  Vitals:   11/23/24 1622 11/23/24 1930 11/23/24 2317 11/24/24 0321  BP: 117/82 129/86 112/80 (!) 127/90  Pulse: (!) 103 (!) 103 (!) 103 99  Resp: 18     Temp: 97.7 F (36.5 C) 98.7 F (37.1 C) 98.8 F (37.1 C) 98.6 F (37 C)  TempSrc: Oral Oral Oral Oral  SpO2: 100% 97% 97% 99%  Weight:    75 kg  Height:        Intake/Output Summary (Last 24 hours) at 11/24/2024 1001 Last data filed at 11/24/2024 0847 Gross per 24 hour  Intake 960 ml  Output 3000 ml  Net -2040 ml   Filed Weights   11/22/24 2201 11/23/24 0424 11/24/24 0321  Weight: 79.1 kg 76.4 kg 75 kg    Physical Exam  GEN: No acute distress.   Neck: No JVD. Cardiac: RRR, no murmur, rub, or gallop.  Respiratory: Nonlabored. Clear to auscultation bilaterally. GI: Soft, nontender, bowel sounds present. MS: No edema. Neuro:  Nonfocal. Psych: Alert and oriented x 3. Normal affect.  ECG/Telemetry  Telemetry reviewed showing sinus tachycardia.  Labs  Chemistry Recent Labs  Lab 11/22/24 1958 11/23/24 0436 11/24/24 0510  NA 139 141 141  K 4.0 3.8 3.8  CL 106 104 103  CO2 20* 28 29  GLUCOSE 107* 91 117*  BUN 14 13 19   CREATININE 0.90 0.89 0.91  CALCIUM 9.2 9.5 9.4  PROT 6.7 6.8 6.1*  ALBUMIN 3.8 3.7 3.4*  AST 71* 67* 47*  ALT 100* 97* 74*  ALKPHOS 148* 157* 145*  BILITOT 1.3* 1.6* 1.0  GFRNONAA >60 >60 >60  ANIONGAP 13 10 9     Hematology Recent Labs  Lab 11/22/24 1958  WBC 10.6*  RBC 4.56  HGB 15.1  HCT 44.2  MCV 96.9  MCH 33.1  MCHC 34.2  RDW 14.6  PLT 206   Cardiac Enzymes Recent Labs  Lab 11/22/24 1958 11/22/24 2253  TRNPT 27* 27*     Cardiac Studies  Echocardiogram 11/23/2024:  1. Left  ventricular ejection fraction, by estimation, is 35 to 40%. Left  ventricular ejection fraction by 3D volume is 40 %. The left ventricle has  moderately decreased function. The left ventricle demonstrates global  hypokinesis. The left ventricular  internal cavity size was severely dilated. There is mild concentric left  ventricular hypertrophy. Left ventricular diastolic parameters are  consistent with Grade III diastolic dysfunction (restrictive). Elevated  left atrial pressure.   2. Right ventricular systolic function is severely reduced. The right  ventricular size is normal. There is moderately elevated pulmonary artery  systolic pressure. The estimated right ventricular systolic pressure is  48.2 mmHg.   3. Left atrial size was moderately dilated.   4. Right atrial size was mildly dilated.   5. A small pericardial effusion is present. The pericardial effusion is  localized near the right atrium.   6. The mitral valve is abnormal, mildly thickened and with incomplete  coaptation secondary to annular dilatation. Severe mitral valve  regurgitation, posteriorly directed.   7. Tricuspid valve regurgitation is moderate.   8. The aortic valve is tricuspid. There is nodular thickening at the  leaflet tips and avariable echodensity seen on ventricular side of valve  in parasternal long axis view only (some images are off axis as well).  Cannot exclude vegetation or leaflet tip   prolapse. Aortic valve regurgitation is severe. Vena contracta 0.9 cm.   9. The inferior vena cava is dilated in size with >50% respiratory  variability, suggesting right atrial pressure of 8 mmHg.   Assessment & Plan  1.  HFrEF, newly documented with LVEF 35 to 40%, grade 3 diastolic dysfunction and severe RV dysfunction with moderately elevated RVSP.  Possibly alcohol induced based on history although valvular heart disease also complicates the picture.  Presents with bilateral leg edema and orthopnea.  High-sensitivity troponin T levels are not consistent with ACS and he reports no recent chest pain.  TSH normal.  He is tolerating diuresis with overall improvement in symptoms.  2.  Severe mitral regurgitation, likely in the setting of annular dilatation and incomplete leaflet coaptation.  3.  Severe aortic regurgitation, cannot exclude vegetation at leaflet tips versus leaflet prolapse, although seen in only parasternal views and with off axis imaging.  Has had no recent fevers or chills.  Possible pneumonia back in October however.   4.  Transaminitis, possibly associated with HFrEF.  AST and ALT improving.  Hepatitis panel negative.  Abdominal ultrasound shows no significant abnormalities.   5.  History of asthma, does not report any regular use of MDI at home.   6.  Elevated blood pressure, no standing diagnosis of primary hypertension, although no regular medical care as an outpatient.   7.  Regular alcohol use.  Discussed situation with the patient and his wife this morning, we reviewed the results of his echocardiogram.  He is feeling better clinically with IV diuresis.  Recommending transfer to the hospitalist team at [facility] so that the advanced heart failure service can be involved in his care and workup.  Valvular disease does complicate the picture.  Suspect he will need either a TEE or cardiac MRI to better clarify aortic valve disease.  Sending blood cultures although clinical presentation not suggestive of endocarditis.  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  Continue Lasix 40 mg IV twice daily with potassium supplement, also Aldactone 12.5 mg daily.  Adding losartan 12.5 mg daily.  For questions or updates, please contact [facility] Please consult [email or web address] for contact info under   Signed, Jayson Sierras, MD  11/24/2024, 10:01 AM

## 2024-11-25 DIAGNOSIS — I351 Nonrheumatic aortic (valve) insufficiency: Secondary | ICD-10-CM | POA: Diagnosis not present

## 2024-11-25 DIAGNOSIS — I5020 Unspecified systolic (congestive) heart failure: Secondary | ICD-10-CM | POA: Diagnosis not present

## 2024-11-25 DIAGNOSIS — I5041 Acute combined systolic (congestive) and diastolic (congestive) heart failure: Secondary | ICD-10-CM | POA: Diagnosis not present

## 2024-11-25 DIAGNOSIS — I340 Nonrheumatic mitral (valve) insufficiency: Secondary | ICD-10-CM | POA: Diagnosis not present

## 2024-11-25 LAB — COMPREHENSIVE METABOLIC PANEL WITH GFR
ALT: 64 U/L — ABNORMAL HIGH (ref 0–44)
AST: 41 U/L (ref 15–41)
Albumin: 3.3 g/dL — ABNORMAL LOW (ref 3.5–5.0)
Alkaline Phosphatase: 141 U/L — ABNORMAL HIGH (ref 38–126)
Anion gap: 8 (ref 5–15)
BUN: 20 mg/dL (ref 6–20)
CO2: 30 mmol/L (ref 22–32)
Calcium: 9.2 mg/dL (ref 8.9–10.3)
Chloride: 103 mmol/L (ref 98–111)
Creatinine, Ser: 0.92 mg/dL (ref 0.61–1.24)
GFR, Estimated: >60 mL/min (ref >60)
Glucose, Bld: 96 mg/dL (ref 70–99)
Potassium: 3.6 mmol/L (ref 3.5–5.1)
Sodium: 141 mmol/L (ref 135–145)
Total Bilirubin: 0.8 mg/dL (ref 0.0–1.2)
Total Protein: 5.9 g/dL — ABNORMAL LOW (ref 6.5–8.1)

## 2024-11-25 LAB — MAGNESIUM: Magnesium: 1.9 mg/dL (ref 1.7–2.4)

## 2024-11-25 NOTE — Progress Notes (Signed)
 Progress Note  Patient Name: Alejandro Lopez Date of Encounter: 11/25/2024  Primary Cardiologist: New to [facility]  Interval Summary  Chart reviewed.  Breathlessness and peripheral edema improved.  No orthopnea or chest pain.  Net urine output approximately 800 cc last 24 hours.  Renal function stable.  He awaits transfer to [facility].  Vital Signs  Vitals:   11/24/24 1345 11/24/24 1900 11/24/24 2352 11/25/24 0310  BP: 114/84 (!) 141/98 104/74 119/84  Pulse: 98 (!) 113 87 96  Resp:      Temp: 98.2 F (36.8 C) 98.2 F (36.8 C) 98.2 F (36.8 C) 98.2 F (36.8 C)  TempSrc: Oral Oral Oral Oral  SpO2: 100% 98% 99% 99%  Weight:    72.9 kg  Height:        Intake/Output Summary (Last 24 hours) at 11/25/2024 0947 Last data filed at 11/25/2024 0315 Gross per 24 hour  Intake 530 ml  Output 950 ml  Net -420 ml   Filed Weights   11/23/24 0424 11/24/24 0321 11/25/24 0310  Weight: 76.4 kg 75 kg 72.9 kg    Physical Exam  GEN: No acute distress.   Neck: No JVD. Cardiac: RRR, 2-3/6 apical systolic murmur, soft basal diastolic murmur.  No gallop. Respiratory: Nonlabored. Clear to auscultation bilaterally. GI: Soft, nontender, bowel sounds present. MS: Improving peripheral edema. Neuro:  Nonfocal. Psych: Alert and oriented x 3. Normal affect.  ECG/Telemetry  Telemetry reviewed showing sinus rhythm and sinus tachycardia.  Labs  Chemistry Recent Labs  Lab 11/23/24 0436 11/24/24 0510 11/25/24 0305  NA 141 141 141  K 3.8 3.8 3.6  CL 104 103 103  CO2 28 29 30   GLUCOSE 91 117* 96  BUN 13 19 20   CREATININE 0.89 0.91 0.92  CALCIUM 9.5 9.4 9.2  PROT 6.8 6.1* 5.9*  ALBUMIN 3.7 3.4* 3.3*  AST 67* 47* 41  ALT 97* 74* 64*  ALKPHOS 157* 145* 141*  BILITOT 1.6* 1.0 0.8  GFRNONAA >60 >60 >60  ANIONGAP 10 9 8     Hematology Recent Labs  Lab 11/22/24 1958  WBC 10.6*  RBC 4.56  HGB 15.1  HCT 44.2  MCV 96.9  MCH 33.1  MCHC 34.2  RDW 14.6   PLT 206   Cardiac Enzymes Recent Labs  Lab 11/22/24 1958 11/22/24 2253  TRNPT 27* 27*     Cardiac Studies  Echocardiogram 11/23/2024:  1. Left ventricular ejection fraction, by estimation, is 35 to 40%. Left  ventricular ejection fraction by 3D volume is 40 %. The left ventricle has  moderately decreased function. The left ventricle demonstrates global  hypokinesis. The left ventricular  internal cavity size was severely dilated. There is mild concentric left  ventricular hypertrophy. Left ventricular diastolic parameters are  consistent with Grade III diastolic dysfunction (restrictive). Elevated  left atrial pressure.   2. Right ventricular systolic function is severely reduced. The right  ventricular size is normal. There is moderately elevated pulmonary artery  systolic pressure. The estimated right ventricular systolic pressure is  48.2 mmHg.   3. Left atrial size was moderately dilated.   4. Right atrial size was mildly dilated.   5. A small pericardial effusion is present. The pericardial effusion is  localized near the right atrium.   6. The mitral valve is abnormal, mildly thickened and with incomplete  coaptation secondary to annular dilatation. Severe mitral valve  regurgitation, posteriorly directed.   7. Tricuspid valve regurgitation is moderate.   8. The  aortic valve is tricuspid. There is nodular thickening at the  leaflet tips and avariable echodensity seen on ventricular side of valve  in parasternal long axis view only (some images are off axis as well).  Cannot exclude vegetation or leaflet tip   prolapse. Aortic valve regurgitation is severe. Vena contracta 0.9 cm.   9. The inferior vena cava is dilated in size with >50% respiratory  variability, suggesting right atrial pressure of 8 mmHg.   Assessment & Plan  1.  HFrEF, newly documented with LVEF 35 to 40%, grade 3 diastolic dysfunction and severe RV dysfunction with moderately elevated RVSP.  Possibly  alcohol induced based on history although valvular heart disease also complicates the picture.  Presents with bilateral leg edema and orthopnea. High-sensitivity troponin T levels are not consistent with ACS and he reports no recent chest pain.  TSH normal.  He has had symptomatic improvement with IV diuresis.  Current GDMT includes Cozaar 12.5 mg daily and Aldactone 12.5 mg daily.  2.  Severe mitral regurgitation, likely in the setting of annular dilatation and incomplete leaflet coaptation.  3.  Severe aortic regurgitation, cannot exclude vegetation at leaflet tips versus leaflet prolapse, although seen in only parasternal views and with off axis imaging.  Has had no recent fevers or chills.  Possible pneumonia back in October however.  Screening blood cultures sent and pending.   4.  Transaminitis, possibly associated with HFrEF.  AST and ALT improving.  Hepatitis panel negative.  Abdominal ultrasound shows no significant abnormalities.   5.  History of asthma, does not report any regular use of MDI at home.   6.  Elevated blood pressure, no standing diagnosis of primary hypertension, although no regular medical care as an outpatient.   7.  Regular alcohol use.  He awaits transfer to the hospitalist team at [facility] so that the advanced heart failure service can be involved in his care and workup.  Valvular disease does complicate the picture.  Suspect he will need either a TEE or cardiac MRI to better clarify aortic valve disease.  He is afebrile and blood cultures are sent and pending.  Clinical presentation not suggestive of endocarditis.  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  Decrease Lasix to 40 mg IV once daily.  Continue Cozaar 12.5 mg daily and Aldactone 12.5 mg daily.  For questions or updates, please contact [facility] Please consult [email or web address] for contact info under   Signed, Jayson Sierras, MD  11/25/2024, 9:47 AM

## 2024-11-25 NOTE — Progress Notes (Signed)
 [facility] PROGRESS NOTE  Alejandro Lopez (DOB: 02/07/1981) FMW:[id number] PCP: None  Brief Narrative: Alejandro Lopez is a 43 y.o. male with a history of tobacco use, alcohol abuse, HTN who presented to the ED on 11/22/2024 with swelling in legs extending to scrotum and abdomen as well as dyspnea worse with exertion worsening for > 1 month. He was volume overloaded by exam, started on IV lasix with cardiology consultation. Echocardiogram on 12/1 showed LVEF 35-40%, G3DD, severe MR and AI. Volume status continues to improved with diuresis. GDMT being added for HFrEF/NICM. Cardiology recommends transfer to [facility] for advanced heart failure team assessment.   Subjective: No chest pain recently or currently. Dyspnea is minimal on exertion. Still has some orthopnea. Spouse at bedside.   Objective: BP 119/84 (BP Location: Right Arm)   Pulse 96   Temp 98.2 F (36.8 C) (Oral)   Resp 18   Ht 5' 10 (1.778 m)   Wt 72.9 kg   SpO2 99%   BMI 23.06 kg/m   Gen: No distress Pulm: Nonlabored, crackles at bases noted. No wheezes.   CV: RRR, III/VI systolic murmur at apex, diastolic murmur also suspected to RUSB. Trace dependent pitting edema.  GI: Soft, NT, ND, +BS  Neuro: Alert and oriented. No new focal deficits. Ext: Warm, no deformities. Skin: No rashes, lesions or ulcers on visualized skin   Assessment & Plan: Acute combined HFrEF, biventricular heart failure: LVEF 35-40% without regional wall motion abnormalities, troponin 27 > 27. G3DD with severe valvulopathy (MR/AI). - Cardiology consulted, recommended transfer for advanced HF team consultation. Considering cardiac MRI and/or TEE.  - Volume status improving. Weight 174lbs on presentation, down to 160lbs today. Still some edema. Cr stable. Deescalating lasix to 40mg  IV once daily today.  - Continue strict I/O, weights.   AV regurgitation and nodular thickening:  - Blood cultures drawn, though no SIRS to suggest endocarditis. -  Cardiology giving consideration to TEE.   LFT elevations: Suspected to be due to alcohol abuse and congestive hepatopathy, improving. Hepatitis panel negative. RUQ U/S negative. - Recheck periodically.   - EtOH cessation counseling provided.   Tobacco use:  - Cessation counseling provided.   Alcohol abuse: No withdrawal currently.  - Cessation counseling provided.   Alejandro KATHEE Come, MD [facility] [email or web address] 11/25/2024, 12:31 PM

## 2024-11-25 NOTE — Plan of Care (Signed)

## 2024-11-26 ENCOUNTER — Telehealth (HOSPITAL_COMMUNITY): Payer: Self-pay

## 2024-11-26 ENCOUNTER — Inpatient Hospital Stay (HOSPITAL_COMMUNITY)

## 2024-11-26 ENCOUNTER — Other Ambulatory Visit (HOSPITAL_COMMUNITY): Payer: Self-pay

## 2024-11-26 DIAGNOSIS — I351 Nonrheumatic aortic (valve) insufficiency: Secondary | ICD-10-CM | POA: Diagnosis not present

## 2024-11-26 DIAGNOSIS — I5021 Acute systolic (congestive) heart failure: Secondary | ICD-10-CM | POA: Diagnosis not present

## 2024-11-26 DIAGNOSIS — I340 Nonrheumatic mitral (valve) insufficiency: Secondary | ICD-10-CM

## 2024-11-26 LAB — BASIC METABOLIC PANEL WITH GFR
Anion gap: 6 (ref 5–15)
BUN: 18 mg/dL (ref 6–20)
CO2: 33 mmol/L — ABNORMAL HIGH (ref 22–32)
Calcium: 9 mg/dL (ref 8.9–10.3)
Chloride: 101 mmol/L (ref 98–111)
Creatinine, Ser: 1.09 mg/dL (ref 0.61–1.24)
GFR, Estimated: >60 mL/min (ref >60)
Glucose, Bld: 108 mg/dL — ABNORMAL HIGH (ref 70–99)
Potassium: 3.8 mmol/L (ref 3.5–5.1)
Sodium: 140 mmol/L (ref 135–145)

## 2024-11-26 LAB — C-REACTIVE PROTEIN: CRP: 0.9 mg/dL (ref <1.0)

## 2024-11-26 LAB — MAGNESIUM: Magnesium: 1.7 mg/dL (ref 1.7–2.4)

## 2024-11-26 LAB — SEDIMENTATION RATE: Sed Rate: 3 mm/h (ref 0–16)

## 2024-11-26 MED ORDER — SPIRONOLACTONE 25 MG PO TABS
25.0000 mg | ORAL_TABLET | Freq: Every day | ORAL | Status: DC
  Administered 2024-11-26 – 2024-11-29 (×3): 25 mg via ORAL
  Filled 2024-11-26 (×3): qty 1

## 2024-11-26 MED ORDER — ASPIRIN 81 MG PO CHEW
81.0000 mg | CHEWABLE_TABLET | ORAL | Status: AC
  Administered 2024-11-27: 81 mg via ORAL
  Filled 2024-11-26: qty 1

## 2024-11-26 MED ORDER — MAGNESIUM SULFATE 4 GM/100ML IV SOLN
4.0000 g | Freq: Once | INTRAVENOUS | Status: AC
  Administered 2024-11-26: 4 g via INTRAVENOUS
  Filled 2024-11-26: qty 100

## 2024-11-26 MED ORDER — ORAL CARE MOUTH RINSE: 15.0000 mL | OROMUCOSAL | Status: DC | PRN

## 2024-11-26 MED ORDER — LOSARTAN POTASSIUM 25 MG PO TABS
25.0000 mg | ORAL_TABLET | Freq: Every day | ORAL | Status: DC
  Administered 2024-11-26 – 2024-11-28 (×2): 25 mg via ORAL
  Filled 2024-11-26 (×2): qty 1

## 2024-11-26 MED ORDER — GADOBUTROL 1 MMOL/ML IV SOLN: 10.0000 mL | Freq: Once | INTRAVENOUS | Status: DC | PRN

## 2024-11-26 MED ORDER — SODIUM CHLORIDE 0.9% FLUSH
3.0000 mL | Freq: Two times a day (BID) | INTRAVENOUS | Status: DC
  Administered 2024-11-26 – 2024-11-27 (×2): 3 mL via INTRAVENOUS

## 2024-11-26 MED ORDER — POTASSIUM CHLORIDE CRYS ER 20 MEQ PO TBCR
40.0000 meq | EXTENDED_RELEASE_TABLET | Freq: Once | ORAL | Status: AC
  Administered 2024-11-26: 40 meq via ORAL
  Filled 2024-11-26: qty 2

## 2024-11-26 MED ORDER — SODIUM CHLORIDE 0.9% FLUSH: 3.0000 mL | INTRAVENOUS | Status: DC | PRN

## 2024-11-26 MED ORDER — EMPAGLIFLOZIN 10 MG PO TABS
10.0000 mg | ORAL_TABLET | Freq: Every day | ORAL | Status: DC
  Administered 2024-11-26: 10 mg via ORAL
  Filled 2024-11-26: qty 1

## 2024-11-26 MED ORDER — SODIUM CHLORIDE 0.9 % IV SOLN: 250.0000 mL | INTRAVENOUS | Status: DC | PRN

## 2024-11-26 MED ORDER — GADOBUTROL 1 MMOL/ML IV SOLN
10.0000 mL | Freq: Once | INTRAVENOUS | Status: AC | PRN
  Administered 2024-11-26: 10 mL via INTRAVENOUS

## 2024-11-26 NOTE — Plan of Care (Signed)
  Problem: Education: Goal: Knowledge of General Education information will improve Description: Including pain rating scale, medication(s)/side effects and non-pharmacologic comfort measures Outcome: Progressing   Problem: Health Behavior/Discharge Planning: Goal: Ability to manage health-related needs will improve Outcome: Progressing   Problem: Clinical Measurements: Goal: Ability to maintain clinical measurements within normal limits will improve Outcome: Progressing Goal: Will remain free from infection Outcome: Progressing   Problem: Education: Goal: Ability to verbalize understanding of medication therapies will improve Outcome: Progressing

## 2024-11-26 NOTE — Telephone Encounter (Signed)
 Pharmacy Patient Advocate Encounter  Insurance verification completed.    The patient is insured through [facility] MEDICAID.     Ran test claim for Entresto 24-26mg  and the current 30 day co-pay is $4.  Ran test claim for Jardiance 10mg  and the current 30 day co-pay is $4.   This test claim was processed through [employer]- copay amounts may vary at other pharmacies due to [employer], or as the patient moves through the different stages of their insurance plan.

## 2024-11-26 NOTE — Plan of Care (Signed)

## 2024-11-26 NOTE — Progress Notes (Signed)
 Heart Failure Navigator Progress Note  Assessed for Heart & Vascular TOC clinic readiness.  Patient does not meet criteria due to Advanced Heart Failure team patient of Dr. Alease Amend. .   Navigator will sign off at this time.   Randie Bustle, BSN, Scientist, clinical (histocompatibility and immunogenetics) Only

## 2024-11-26 NOTE — Progress Notes (Signed)
 [facility] PROGRESS NOTE  Alejandro Lopez (DOB: 10-28-1981) FMW:[id number] PCP: None  Brief Narrative: Alejandro Lopez is a 43 y.o. male with a history of tobacco use, alcohol abuse, HTN who presented to the ED on 11/22/2024 with swelling in legs extending to scrotum and abdomen as well as dyspnea worse with exertion worsening for > 1 month. Pt was started on IV lasix with cardiology consultation. Echocardiogram on 12/1 showed LVEF 35-40%, G3DD, severe MR and AI. Patient transferred to [facility] from [facility] for advanced heart failure team management  Subjective: Patient reports feeling better overall, denies any chest pain, worsening dyspnea, abdominal pain, nausea/vomiting, fever/chills.    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  Avoid beta-blockers due to severe AR Continue strict I/O, weights Telemetry  Transaminitis Improving, possibly due to congestive hepatopathy, alcohol abuse Hepatitis panel negative. RUQ U/S negative. Monitor LFTs periodically  Tobacco use  Advised to  quit  Alcohol abuse No withdrawal noted Advised to quit      Alejandro JINNY Cage, MD [facility] [email or web address] 11/26/2024, 3:20 PM

## 2024-11-26 NOTE — Progress Notes (Signed)
Patient transported via [employer]

## 2024-11-27 ENCOUNTER — Inpatient Hospital Stay (HOSPITAL_COMMUNITY)

## 2024-11-27 ENCOUNTER — Inpatient Hospital Stay (HOSPITAL_COMMUNITY): Payer: Self-pay | Admitting: Anesthesiology

## 2024-11-27 ENCOUNTER — Encounter (HOSPITAL_COMMUNITY): Payer: Self-pay | Admitting: Anesthesiology

## 2024-11-27 ENCOUNTER — Encounter (HOSPITAL_COMMUNITY)
Admission: EM | Disposition: A | Discharge status: Rehabilitation Facility | Payer: Self-pay | Source: Home / Self Care | Attending: Surgery

## 2024-11-27 DIAGNOSIS — I351 Nonrheumatic aortic (valve) insufficiency: Secondary | ICD-10-CM

## 2024-11-27 DIAGNOSIS — I340 Nonrheumatic mitral (valve) insufficiency: Secondary | ICD-10-CM

## 2024-11-27 DIAGNOSIS — I5021 Acute systolic (congestive) heart failure: Secondary | ICD-10-CM | POA: Diagnosis not present

## 2024-11-27 HISTORY — PX: RIGHT HEART CATH AND CORONARY ANGIOGRAPHY: CATH118264

## 2024-11-27 HISTORY — PX: TRANSESOPHAGEAL ECHOCARDIOGRAM (CATH LAB): EP1270

## 2024-11-27 LAB — POCT I-STAT EG7
Acid-Base Excess: 4 mmol/L — ABNORMAL HIGH (ref 0.0–2.0)
Acid-Base Excess: 5 mmol/L — ABNORMAL HIGH (ref 0.0–2.0)
Bicarbonate: 30.7 mmol/L — ABNORMAL HIGH (ref 20.0–28.0)
Bicarbonate: 31.1 mmol/L — ABNORMAL HIGH (ref 20.0–28.0)
Calcium, Ion: 1.2 mmol/L (ref 1.15–1.40)
Calcium, Ion: 1.22 mmol/L (ref 1.15–1.40)
HCT: 50 % (ref 39.0–52.0)
HCT: 51 % (ref 39.0–52.0)
Hemoglobin: 17 g/dL (ref 13.0–17.0)
Hemoglobin: 17.3 g/dL — ABNORMAL HIGH (ref 13.0–17.0)
O2 Saturation: 68 %
O2 Saturation: 69 %
Potassium: 3.8 mmol/L (ref 3.5–5.1)
Potassium: 3.9 mmol/L (ref 3.5–5.1)
Sodium: 137 mmol/L (ref 135–145)
Sodium: 138 mmol/L (ref 135–145)
TCO2: 32 mmol/L (ref 22–32)
TCO2: 33 mmol/L — ABNORMAL HIGH (ref 22–32)
pCO2, Ven: 50.3 mmHg (ref 44–60)
pCO2, Ven: 50.5 mmHg (ref 44–60)
pH, Ven: 7.392 (ref 7.25–7.43)
pH, Ven: 7.399 (ref 7.25–7.43)
pO2, Ven: 36 mmHg (ref 32–45)
pO2, Ven: 37 mmHg (ref 32–45)

## 2024-11-27 LAB — HEMOGLOBIN A1C
Hgb A1c MFr Bld: 6.1 % — ABNORMAL HIGH (ref 4.8–5.6)
Mean Plasma Glucose: 128.37 mg/dL

## 2024-11-27 LAB — BASIC METABOLIC PANEL WITH GFR
Anion gap: 10 (ref 5–15)
BUN: 21 mg/dL — ABNORMAL HIGH (ref 6–20)
CO2: 33 mmol/L — ABNORMAL HIGH (ref 22–32)
Calcium: 9.1 mg/dL (ref 8.9–10.3)
Chloride: 96 mmol/L — ABNORMAL LOW (ref 98–111)
Creatinine, Ser: 1.24 mg/dL (ref 0.61–1.24)
GFR, Estimated: >60 mL/min (ref >60)
Glucose, Bld: 92 mg/dL (ref 70–99)
Potassium: 4.1 mmol/L (ref 3.5–5.1)
Sodium: 139 mmol/L (ref 135–145)

## 2024-11-27 LAB — LIPID PANEL
Cholesterol: 149 mg/dL (ref 0–200)
HDL: 40 mg/dL — ABNORMAL LOW (ref >40)
LDL Cholesterol: 93 mg/dL (ref 0–99)
Total CHOL/HDL Ratio: 3.7 ratio
Triglycerides: 82 mg/dL (ref <150)
VLDL: 16 mg/dL (ref 0–40)

## 2024-11-27 LAB — MAGNESIUM: Magnesium: 2.3 mg/dL (ref 1.7–2.4)

## 2024-11-27 MED ORDER — LIDOCAINE HCL (PF) 1 % IJ SOLN
INTRAMUSCULAR | Status: DC | PRN
  Administered 2024-11-27 (×2): 2 mL

## 2024-11-27 MED ORDER — HEPARIN (PORCINE) IN NACL 1000-0.9 UT/500ML-% IV SOLN
INTRAVENOUS | Status: DC | PRN
  Administered 2024-11-27: 1000 mL

## 2024-11-27 MED ORDER — VERAPAMIL HCL 2.5 MG/ML IV SOLN
INTRAVENOUS | Status: DC | PRN
  Administered 2024-11-27: 10 mL via INTRA_ARTERIAL

## 2024-11-27 MED ORDER — GLYCOPYRROLATE 0.2 MG/ML IJ SOLN
INTRAMUSCULAR | Status: DC | PRN
  Administered 2024-11-27: .1 mg via INTRAVENOUS

## 2024-11-27 MED ORDER — PROPOFOL 10 MG/ML IV BOLUS
INTRAVENOUS | Status: DC | PRN
  Administered 2024-11-27: 150 ug/kg/min via INTRAVENOUS

## 2024-11-27 MED ORDER — FENTANYL CITRATE (PF) 100 MCG/2ML IJ SOLN
INTRAMUSCULAR | Status: AC
  Filled 2024-11-27: qty 2

## 2024-11-27 MED ORDER — MIDAZOLAM HCL (PF) 2 MG/2ML IJ SOLN
INTRAMUSCULAR | Status: DC | PRN
  Administered 2024-11-27: 1 mg via INTRAVENOUS

## 2024-11-27 MED ORDER — VERAPAMIL HCL 2.5 MG/ML IV SOLN
INTRAVENOUS | Status: AC
  Filled 2024-11-27: qty 2

## 2024-11-27 MED ORDER — HEPARIN SODIUM (PORCINE) 1000 UNIT/ML IJ SOLN
INTRAMUSCULAR | Status: DC | PRN
  Administered 2024-11-27: 5000 [IU] via INTRAVENOUS

## 2024-11-27 MED ORDER — HEPARIN SODIUM (PORCINE) 1000 UNIT/ML IJ SOLN
INTRAMUSCULAR | Status: AC
  Filled 2024-11-27: qty 10

## 2024-11-27 MED ORDER — LIDOCAINE HCL (PF) 1 % IJ SOLN
INTRAMUSCULAR | Status: AC
  Filled 2024-11-27: qty 30

## 2024-11-27 MED ORDER — FENTANYL CITRATE (PF) 100 MCG/2ML IJ SOLN
INTRAMUSCULAR | Status: DC | PRN
  Administered 2024-11-27: 25 ug via INTRAVENOUS

## 2024-11-27 MED ORDER — IOHEXOL 350 MG/ML SOLN
INTRAVENOUS | Status: DC | PRN
  Administered 2024-11-27: 35 mL

## 2024-11-27 MED ORDER — PHENYLEPHRINE 80 MCG/ML (10ML) SYRINGE FOR IV PUSH (FOR BLOOD PRESSURE SUPPORT)
PREFILLED_SYRINGE | INTRAVENOUS | Status: DC | PRN
  Administered 2024-11-27: 160 ug via INTRAVENOUS
  Administered 2024-11-27: 80 ug via INTRAVENOUS

## 2024-11-27 MED ORDER — LIDOCAINE 2% (20 MG/ML) 5 ML SYRINGE
INTRAMUSCULAR | Status: DC | PRN
  Administered 2024-11-27: 100 mg via INTRAVENOUS

## 2024-11-27 MED ORDER — OXYCODONE HCL 5 MG PO TABS
5.0000 mg | ORAL_TABLET | Freq: Once | ORAL | Status: AC
  Administered 2024-11-27: 5 mg via ORAL
  Filled 2024-11-27: qty 1

## 2024-11-27 MED ORDER — MIDAZOLAM HCL 2 MG/2ML IJ SOLN
INTRAMUSCULAR | Status: AC
  Filled 2024-11-27: qty 2

## 2024-11-27 NOTE — Progress Notes (Signed)
 Patient re-educated on keeping arm  with TR band still as patient keeps moving arm around continuously.

## 2024-11-27 NOTE — Anesthesia Preprocedure Evaluation (Addendum)
 Anesthesia Evaluation  Patient identified by MRN, date of birth, ID band Patient awake    Reviewed: Allergy & Precautions, H&P , NPO status , Patient's Chart, lab work & pertinent test results  Airway Mallampati: I  TM Distance: >3 FB Neck ROM: Full    Dental no notable dental hx. (+) Poor Dentition, Chipped, Missing, Dental Advisory Given,    Pulmonary asthma , Current Smoker and Patient abstained from smoking.   Pulmonary exam normal breath sounds clear to auscultation       Cardiovascular Exercise Tolerance: Good hypertension, +CHF  negative cardio ROS Normal cardiovascular exam+ Valvular Problems/Murmurs AI and MR  Rhythm:Regular Rate:Normal  ECHO 12/25   1. Left ventricular ejection fraction, by estimation, is 35 to 40%. Left  ventricular ejection fraction by 3D volume is 40 %. The left ventricle has  moderately decreased function. The left ventricle demonstrates global  hypokinesis. The left ventricular  internal cavity size was severely dilated. There is mild concentric left  ventricular hypertrophy. Left ventricular diastolic parameters are  consistent with Grade III diastolic dysfunction (restrictive). Elevated  left atrial pressure.   2. Right ventricular systolic function is severely reduced. The right  ventricular size is normal. There is moderately elevated pulmonary artery  systolic pressure. The estimated right ventricular systolic pressure is  48.2 mmHg.   3. Left atrial size was moderately dilated.   4. Right atrial size was mildly dilated.   5. A small pericardial effusion is present. The pericardial effusion is  localized near the right atrium.   6. The mitral valve is abnormal, mildly thickened and with incomplete  coaptation secondary to annular dilatation. Severe mitral valve  regurgitation, posteriorly directed.   7. Tricuspid valve regurgitation is moderate.   8. The aortic valve is tricuspid. There  is nodular thickening at the  leaflet tips and avariable echodensity seen on ventricular side of valve  in parasternal long axis view only (some images are off axis as well).  Cannot exclude vegetation or leaflet tip   prolapse. Aortic valve regurgitation is severe. Vena contracta 0.9 cm.   9. The inferior vena cava is dilated in size with >50% respiratory  variability, suggesting right atrial pressure of 8 mmHg.      Neuro/Psych  PSYCHIATRIC DISORDERS      negative neurological ROS  negative psych ROS   GI/Hepatic negative GI ROS, Neg liver ROS,,,  Endo/Other  negative endocrine ROS    Renal/GU negative Renal ROS  negative genitourinary   Musculoskeletal negative musculoskeletal ROS (+)    Abdominal   Peds negative pediatric ROS (+)  Hematology negative hematology ROS (+)   Anesthesia Other Findings   Reproductive/Obstetrics negative OB ROS                              Anesthesia Physical Anesthesia Plan  ASA: 3  Anesthesia Plan: MAC   Post-op Pain Management: Minimal or no pain anticipated   Induction: Intravenous  PONV Risk Score and Plan: 1 and Propofol  infusion  Airway Management Planned: Natural Airway and Simple Face Mask  Additional Equipment: None  Intra-op Plan:   Post-operative Plan:   Informed Consent: I have reviewed the patients History and Physical, chart, labs and discussed the procedure including the risks, benefits and alternatives for the proposed anesthesia with the patient or authorized representative who has indicated his/her understanding and acceptance.       Plan Discussed with: Anesthesiologist  Anesthesia Plan  Comments:          Anesthesia Quick Evaluation

## 2024-11-27 NOTE — Interval H&P Note (Signed)
 History and Physical Interval Note:  11/27/2024 8:00 AM  Alejandro Lopez  has presented today for surgery, with the diagnosis of hf.  The various methods of treatment have been discussed with the patient and family. After consideration of risks, benefits and other options for treatment, the patient has consented to  Procedure(s): RIGHT/LEFT HEART CATH AND CORONARY ANGIOGRAPHY (N/A) as a surgical intervention.  The patient's history has been reviewed, patient examined, no change in status, stable for surgery.  I have reviewed the patient's chart and labs.  Questions were answered to the patient's satisfaction.     Morene JINNY Brownie

## 2024-11-27 NOTE — Progress Notes (Signed)
 Removed air out of TR band before patient was taken down for his TEE, patient came back to unit around 2hrs later, removed band and placed a transparent dressing on R radial site. Ten minutes later I was called into room as patient was bleeding from site and hematoma had been created. Held pressure and MD came bedside. Pressure held until hematoma disappeared and another TR band was placed with 13cc, was instructed by MD to wait about 2hrs before trying to remove any air from band. Will continue to monitor.

## 2024-11-27 NOTE — Plan of Care (Signed)
  Problem: Clinical Measurements: Goal: Will remain free from infection Outcome: Progressing Goal: Respiratory complications will improve Outcome: Progressing   Problem: Activity: Goal: Risk for activity intolerance will decrease Outcome: Progressing   Problem: Nutrition: Goal: Adequate nutrition will be maintained Outcome: Progressing   Problem: Elimination: Goal: Will not experience complications related to bowel motility Outcome: Progressing Goal: Will not experience complications related to urinary retention Outcome: Progressing

## 2024-11-27 NOTE — Progress Notes (Signed)
 Carotid artery duplex has been completed. Preliminary results can be found in CV Proc through chart review.   11/27/24 4:03 PM Cathlyn Collet RVT

## 2024-11-27 NOTE — Transfer of Care (Signed)
 Immediate Anesthesia Transfer of Care Note  Patient: Alejandro Lopez  Procedure(s) Performed: TRANSESOPHAGEAL ECHOCARDIOGRAM  Patient Location: Cath Lab  Anesthesia Type:General  Level of Consciousness: drowsy  Airway & Oxygen  Therapy: Patient Spontanous Breathing  Post-op Assessment: Report given to RN and Post -op Vital signs reviewed and stable  Post vital signs: Reviewed and stable  Last Vitals:  Vitals Value Taken Time  BP 94/71 11/27/24 13:57  Temp 36.6 C 11/27/24 13:53  Pulse 95 11/27/24 13:57  Resp 19 11/27/24 13:57  SpO2 94 % 11/27/24 13:57  Vitals shown include unfiled device data.  Last Pain:  Vitals:   11/27/24 1353  TempSrc: Temporal  PainSc:       Patients Stated Pain Goal: 0 (11/26/24 0027)  Complications: No notable events documented.

## 2024-11-27 NOTE — Progress Notes (Signed)
 Advanced Heart Failure Rounding Note  Cardiologist: None  Chief Complaint: HF/Valvular Disease Subjective:   Cath -normal coronaries.  RA 5 PCWP 14, Papi 3 CO 4.9 CI 2.6.  No complaints.     Objective:   Weight Range: 67.7 kg Body mass index is 21.42 kg/m.   Vital Signs:   Temp:  [97.9 F (36.6 C)-99.1 F (37.3 C)] 98.3 F (36.8 C) (12/05 0735) Pulse Rate:  [0-104] 97 (12/05 0915) Resp:  [12-22] 19 (12/05 0915) BP: (90-136)/(67-94) 110/82 (12/05 0915) SpO2:  [89 %-99 %] 94 % (12/05 0915) Weight:  [67.7 kg] 67.7 kg (12/05 0520) Last BM Date : 11/25/24  Weight change: Filed Weights   11/26/24 0027 11/26/24 0449 11/27/24 0520  Weight: 71.9 kg 71.9 kg 67.7 kg    Intake/Output:   Intake/Output Summary (Last 24 hours) at 11/27/2024 0918 Last data filed at 11/27/2024 0522 Gross per 24 hour  Intake 240 ml  Output 3475 ml  Net -3235 ml      Physical Exam   General:   No resp difficulty Neck: no JVD.  Cor: Regular rate & rhythm.  Lungs: clear Abdomen: soft, nontender, nondistended.  Extremities: no  edema Neuro: alert & oriented x3   Telemetry  SR   EKG    N/A   Labs    CBC No results for input(s): WBC, NEUTROABS, HGB, HCT, MCV, PLT in the last 72 hours. Basic Metabolic Panel Recent Labs    87/95/74 0406 11/26/24 0724 11/27/24 0205  NA 140  --  139  K 3.8  --  4.1  CL 101  --  96*  CO2 33*  --  33*  GLUCOSE 108*  --  92  BUN 18  --  21*  CREATININE 1.09  --  1.24  CALCIUM 9.0  --  9.1  MG  --  1.7 2.3   Liver Function Tests Recent Labs    11/25/24 0305  AST 41  ALT 64*  ALKPHOS 141*  BILITOT 0.8  PROT 5.9*  ALBUMIN 3.3*   No results for input(s): LIPASE, AMYLASE in the last 72 hours. Cardiac Enzymes No results for input(s): CKTOTAL, CKMB, CKMBINDEX, TROPONINI in the last 72 hours.  BNP: BNP (last 3 results) No results for input(s): BNP in the last 8760 hours.  ProBNP (last 3 results) Recent Labs     11/22/24 1958  PROBNP 3,354.0*     D-Dimer No results for input(s): DDIMER in the last 72 hours. Hemoglobin A1C Recent Labs    11/27/24 0205  HGBA1C 6.1*   Fasting Lipid Panel Recent Labs    11/27/24 0205  CHOL 149  HDL 40*  LDLCALC 93  TRIG 82  CHOLHDL 3.7   Thyroid Function Tests No results for input(s): TSH, T4TOTAL, T3FREE, THYROIDAB in the last 72 hours.  Invalid input(s): FREET3  Other results:   Imaging    CARDIAC CATHETERIZATION Result Date: 11/27/2024 HEMODYNAMICS: RA:       5 mmHg (mean) RV:       32/2, 5 mmHg PA:       32/17 mmHg (24 mean) PCWP: 14 mmHg (mean), no significant v waves    Estimated Fick CO/CI   4.9L/min, 2.59L/min/m2 Thermodilution CO/CI   3.88L/min, 2.05L/min/m2    TPG  10  mmHg     PVR  2.57 Wood Units PAPi  3  IMPRESSION: Right heart catheterization and coronary angiography for evaluation of severe AR/MR and cardiomyopathy Normal left and right heart filling pressures  Mildly reduced output by thermodilution Normal coronary arteriography RECOMMENDATIONS: Reduced cardiac index concerning given severe AR. Plan for TEE and surgical consultation   MR CARDIAC MORPHOLOGY W WO CONTRAST Result Date: 11/26/2024 CLINICAL DATA:  Clinical question of aortic regurgitation, mitral regurgitation Study assumes HCT of 44 and BSA of 1.88 m2. EXAM: CARDIAC MRI TECHNIQUE: The patient was scanned on a 1.5 Tesla GE magnet. A dedicated cardiac coil was used. Functional imaging was done using Fiesta sequences. 2,3, and 4 chamber views were done to assess for RWMA's. Modified Simpson's rule using was used to calculate an ejection fraction on a dedicated work [employer]. The patient received 10 cc of Gadavist . After 10 minutes inversion recovery sequences were used to assess for infiltration and scar tissue. Flow quantification was performed 2 times during this examination with flow quantification performed at the levels of the ascending  aorta above the valve, pulmonary artery above the valve. CONTRAST:  10 cc  of Gadavist  FINDINGS: 1. Severely dilated left ventricular size, with LVEDD 77 mm, and LVEDVi 219 mL/m2. Normal left ventricular thickness. Moderate decrease in left ventricular systolic function (LVEF =35%). There are no regional wall motion abnormalities but global hypokinesis. Left ventricular parametric mapping notable for normal T2. ECV is elevated (38%) in the mid interventricular septum. There is no late gadolinium enhancement in the left ventricular myocardium. Normal resting perfusion. 2. Dilated right ventricular size with RVEDVI 131 mL/m2. Normal right ventricular thickness. Moderate decrease in right ventricular systolic function (RVEF =30%). Relatively preserved free wall excursion, decrease septal function. 3.  Bi-atrial dilation. 4. Normal size of the aortic root, ascending aorta and pulmonary artery. 5. Valve assessment: Aortic Valve: Tri-leaflet valve. There is no central valve coaptation. Mean gradient < 1 mm Hg. Average regurgitation fraction 51%. Severe aortic regurgitation. Pulmonic Valve: Mild regurgitation.  Regurgitant fraction 15%. Tricuspid Valve: Qualitatively no significant regurgitation. Mitral Valve: Ventricular functional with posterior restriction. Bi-leaflet thickening. Valve does not appears to coapt in the four chamber view. Regurgitation fraction 45%. Severe regurgitation. 6. Normal pericardium. Small, circumferential pericardial effusion without evidence of increase pericardial pressures. 7. Grossly, no extracardiac findings. Recommended dedicated study if concerned for non-cardiac pathology. IMPRESSION: 1.  Dilated cardiomyopathy with LVEF 35% but without LGE. 2.  Dilated right ventricle with RVEF 30%. 3.  Severe aortic regurgitation. 4.  Severe mitral regurgitation. Stanly Leavens MD Electronically Signed   By: Stanly Leavens M.D.   On: 11/26/2024 13:53   MR CARDIAC VELOCITY FLOW  MAP Result Date: 11/26/2024 CLINICAL DATA:  Clinical question of aortic regurgitation, mitral regurgitation Study assumes HCT of 44 and BSA of 1.88 m2. EXAM: CARDIAC MRI TECHNIQUE: The patient was scanned on a 1.5 Tesla GE magnet. A dedicated cardiac coil was used. Functional imaging was done using Fiesta sequences. 2,3, and 4 chamber views were done to assess for RWMA's. Modified Simpson's rule using was used to calculate an ejection fraction on a dedicated work [employer]. The patient received 10 cc of Gadavist . After 10 minutes inversion recovery sequences were used to assess for infiltration and scar tissue. Flow quantification was performed 2 times during this examination with flow quantification performed at the levels of the ascending aorta above the valve, pulmonary artery above the valve. CONTRAST:  10 cc  of Gadavist  FINDINGS: 1. Severely dilated left ventricular size, with LVEDD 77 mm, and LVEDVi 219 mL/m2. Normal left ventricular thickness. Moderate decrease in left ventricular systolic function (LVEF =35%). There are no regional wall motion  abnormalities but global hypokinesis. Left ventricular parametric mapping notable for normal T2. ECV is elevated (38%) in the mid interventricular septum. There is no late gadolinium enhancement in the left ventricular myocardium. Normal resting perfusion. 2. Dilated right ventricular size with RVEDVI 131 mL/m2. Normal right ventricular thickness. Moderate decrease in right ventricular systolic function (RVEF =30%). Relatively preserved free wall excursion, decrease septal function. 3.  Bi-atrial dilation. 4. Normal size of the aortic root, ascending aorta and pulmonary artery. 5. Valve assessment: Aortic Valve: Tri-leaflet valve. There is no central valve coaptation. Mean gradient < 1 mm Hg. Average regurgitation fraction 51%. Severe aortic regurgitation. Pulmonic Valve: Mild regurgitation.  Regurgitant fraction 15%. Tricuspid Valve:  Qualitatively no significant regurgitation. Mitral Valve: Ventricular functional with posterior restriction. Bi-leaflet thickening. Valve does not appears to coapt in the four chamber view. Regurgitation fraction 45%. Severe regurgitation. 6. Normal pericardium. Small, circumferential pericardial effusion without evidence of increase pericardial pressures. 7. Grossly, no extracardiac findings. Recommended dedicated study if concerned for non-cardiac pathology. IMPRESSION: 1.  Dilated cardiomyopathy with LVEF 35% but without LGE. 2.  Dilated right ventricle with RVEF 30%. 3.  Severe aortic regurgitation. 4.  Severe mitral regurgitation. Stanly Leavens MD Electronically Signed   By: Stanly Leavens M.D.   On: 11/26/2024 13:53   MR CARDIAC VELOCITY FLOW MAP Result Date: 11/26/2024 CLINICAL DATA:  Clinical question of aortic regurgitation, mitral regurgitation Study assumes HCT of 44 and BSA of 1.88 m2. EXAM: CARDIAC MRI TECHNIQUE: The patient was scanned on a 1.5 Tesla GE magnet. A dedicated cardiac coil was used. Functional imaging was done using Fiesta sequences. 2,3, and 4 chamber views were done to assess for RWMA's. Modified Simpson's rule using was used to calculate an ejection fraction on a dedicated work [employer]. The patient received 10 cc of Gadavist . After 10 minutes inversion recovery sequences were used to assess for infiltration and scar tissue. Flow quantification was performed 2 times during this examination with flow quantification performed at the levels of the ascending aorta above the valve, pulmonary artery above the valve. CONTRAST:  10 cc  of Gadavist  FINDINGS: 1. Severely dilated left ventricular size, with LVEDD 77 mm, and LVEDVi 219 mL/m2. Normal left ventricular thickness. Moderate decrease in left ventricular systolic function (LVEF =35%). There are no regional wall motion abnormalities but global hypokinesis. Left ventricular parametric mapping notable  for normal T2. ECV is elevated (38%) in the mid interventricular septum. There is no late gadolinium enhancement in the left ventricular myocardium. Normal resting perfusion. 2. Dilated right ventricular size with RVEDVI 131 mL/m2. Normal right ventricular thickness. Moderate decrease in right ventricular systolic function (RVEF =30%). Relatively preserved free wall excursion, decrease septal function. 3.  Bi-atrial dilation. 4. Normal size of the aortic root, ascending aorta and pulmonary artery. 5. Valve assessment: Aortic Valve: Tri-leaflet valve. There is no central valve coaptation. Mean gradient < 1 mm Hg. Average regurgitation fraction 51%. Severe aortic regurgitation. Pulmonic Valve: Mild regurgitation.  Regurgitant fraction 15%. Tricuspid Valve: Qualitatively no significant regurgitation. Mitral Valve: Ventricular functional with posterior restriction. Bi-leaflet thickening. Valve does not appears to coapt in the four chamber view. Regurgitation fraction 45%. Severe regurgitation. 6. Normal pericardium. Small, circumferential pericardial effusion without evidence of increase pericardial pressures. 7. Grossly, no extracardiac findings. Recommended dedicated study if concerned for non-cardiac pathology. IMPRESSION: 1.  Dilated cardiomyopathy with LVEF 35% but without LGE. 2.  Dilated right ventricle with RVEF 30%. 3.  Severe aortic regurgitation. 4.  Severe mitral regurgitation.  Stanly Leavens MD Electronically Signed   By: Stanly Leavens M.D.   On: 11/26/2024 13:53     Medications:     Scheduled Medications:  empagliflozin   10 mg Oral Daily   folic acid   1 mg Oral Daily   losartan   25 mg Oral Daily   multivitamin with minerals  1 tablet Oral Daily   spironolactone   25 mg Oral Daily   thiamine   100 mg Oral Daily    Infusions:   PRN Medications: acetaminophen , albuterol , gadobutrol , ondansetron  **OR** ondansetron  (ZOFRAN ) IV, mouth rinse, polyethylene glycol    Patient  Profile   43 y.o. male w/ h/o heavy alcohol use (at least 5 drinks per evening) and h/o asthma admitted w/ acute systolic heart failure. Strong family history of cardiac disease: mother and maternal grandmother had heart failure and father with CAD.   HFrEF. Severe valvular diease. HF Team consulted.   Assessment/Plan  1. 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 LVEF 35%  - Cath- normal cors, RA5, PA 32/17 (24), PVR 2.57 Papi 3. CO 4.9 CI 3.8 -  Continue losartan  to 25 mg daily - Continue spironolactone  to 25 mg daily  - Hold off on SGLT2i - no ? blocker with tachycardia, suspect compensatory  2. Valvular Disease  - severe MR posteriorly directed, mod TR. Likely functional. HF optimization per above - severe AI, ? Echodensity/vegetation on AoV leaflet. No symptoms of systemic infection. Bcx NGTD. - CMR - Severe AR/MR  - Consult TCTS   3. Transaminates - AST/ALT 71/100. Improving - Hepatitis panel negative. RUQ US  unremarkable  - suspect 2/2 CHF/hepatic congestion + chronic ETOH use  - continue diuresis - follow trend    4. ETOH Abuse - heavy drinker, at least 5 drinks per evening - may be contributing to CM, reduction of intake imperative  - CIWA protocol ordered    5. Hypomagnesemia - follow; goal >2   6. DMII  Hgb A1C 6.1   Dr Zenaida discussed with Dr Shyrl.  Length of Stay: 4  Chong Wojdyla, NP  11/27/2024, 9:18 AM  Advanced Heart Failure Team Pager [phone] (M-F; 7a - 5p)  Please contact [facility] for night-coverage after hours (5p -7a ) and weekends on [email or web address]

## 2024-11-27 NOTE — Consult Note (Signed)
 [address]       [place] [address]             [phone]        Alejandro Lopez [name] #[id number] Date of Birth: 1981/10/20  Referring: Alejandro Brownie, MD Primary Care: Patient, No Pcp Per Primary Cardiologist: Sheppard Sierras ( new)  Chief Complaint: Severe AI and MR with acute systolic heart failure   History of Present Illness:      The patient is a 43 year old gentleman with a history of hypertension, 1/2 pack/day smoking, heavy alcohol use (estimated 5 shots per day), history of asthma, and a strong family history of heart disease who presented with worsening shortness of breath and orthopnea as well as lower extremity edema that has progressed over the last several weeks.  He was initially treated for pneumonia with antibiotics without improvement.  In the emergency department his proBNP was 3354.  Chest x-ray showed cardiomegaly but no pulmonary edema or pleural effusions.  Electrocardiogram showed sinus tachycardia at 114 with anterior septal Q waves and left atrial enlargement. Hs troponin was 27>27.  He was admitted and started on IV Lasix .  2D echocardiogram showed a left ventricular ejection fraction of 35 to 40% with severely reduced RV systolic function, severe mitral regurgitation, and severe aortic insufficiency.  There is some nodular thickening at the leaflet tips.  The patient had no fever or chills and white blood cell count was 10.6 on admission.  There is no history of drug abuse. BC are negative.  Left ventricular diastolic diameter was 7.2 cm with a systolic diameter of 5.8 cm.  A cardiac MRI showed a diastolic diameter of 7.7 cm.  Left ventricular ejection fraction was 35% with global hypokinesis.  There was no LGE.  RVEF was 30%.  The aortic root and ascending aorta were normal.  The aortic valve was trileaflet with lack of central coaptation.  The regurgitant fraction was 51% consistent with severe AI.  The mitral valve had  bileaflet thickening with lack of coaptation of the leaflets and regurgitant fraction of 45% consistent with severe regurgitation.  Cardiac catheterization today showed normal coronary anatomy.  Cardiac index by thermodilution was 2.0.  Left and right heart pressures were within normal limits. PAPi was 3.  The patient's wife is with him.  He works full-time as a estate agent.    Past Medical History:  Diagnosis Date   Asthma     Past Surgical History:  Procedure Laterality Date   FRACTURE SURGERY      Social History   Tobacco Use  Smoking Status Every Day   Current packs/day: 1.00   Types: Cigarettes  Smokeless Tobacco Never    Social History   Substance and Sexual Activity  Alcohol Use Yes   Alcohol/week: 4.0 standard drinks of alcohol   Types: 4 Shots of liquor per week   Comment: every day      No Known Allergies  Current Facility-Administered Medications  Medication Dose Route Frequency Provider Last Rate Last Admin   acetaminophen  (TYLENOL ) tablet 650 mg  650 mg Oral Q6H PRN Tat, Alm, MD   650 mg at 11/27/24 0133   albuterol  (PROVENTIL ) (2.5 MG/3ML) 0.083% nebulizer solution 2.5 mg  2.5 mg Nebulization Q4H PRN Tat, David, MD       folic acid  (FOLVITE ) tablet 1 mg  1 mg Oral Daily Tat, David, MD   1 mg at 11/26/24 9061   gadobutrol  (  GADAVIST ) 1 MMOL/ML injection 10 mL  10 mL Intravenous Once PRN Lee, Jordan, NP       losartan  (COZAAR ) tablet 25 mg  25 mg Oral Daily Stoner, Benjamin J, MD   25 mg at 11/26/24 9061   multivitamin with minerals tablet 1 tablet  1 tablet Oral Daily Tat, David, MD   1 tablet at 11/26/24 9061   ondansetron  (ZOFRAN ) tablet 4 mg  4 mg Oral Q6H PRN Tat, Alm, MD       Or   ondansetron  (ZOFRAN ) injection 4 mg  4 mg Intravenous Q6H PRN Tat, Alm, MD       Oral care mouth rinse  15 mL Mouth Rinse PRN Bryn Bernardino NOVAK, MD       polyethylene glycol (MIRALAX  / GLYCOLAX ) packet 17 g  17 g Oral Daily PRN Tat, Alm, MD       spironolactone   (ALDACTONE ) tablet 25 mg  25 mg Oral Daily Stoner, Benjamin J, MD   25 mg at 11/26/24 9061   thiamine  (VITAMIN B1) tablet 100 mg  100 mg Oral Daily Tat, David, MD   100 mg at 11/26/24 9061    Medications Prior to Admission  Medication Sig Dispense Refill Last Dose/Taking   albuterol  (VENTOLIN  HFA) 108 (90 Base) MCG/ACT inhaler Inhale 2 puffs into the lungs every 4 (four) hours as needed. 18 g 0 11/22/2024 Noon   guaiFENesin (MUCINEX) 600 MG 12 hr tablet Take 1,200 mg by mouth 2 (two) times daily as needed for to loosen phlegm or cough.   Past Month   ibuprofen  (ADVIL ) 800 MG tablet Take 1 tablet (800 mg total) by mouth every 8 (eight) hours as needed for moderate pain. 20 tablet 0 Unknown   azithromycin  (ZITHROMAX ) 250 MG tablet Take 250 mg by mouth as directed. (Patient not taking: Reported on 11/23/2024)   Not Taking    Family History  Problem Relation Age of Onset   Stroke Mother    Hypertension Mother    Diabetes Mother      Review of Systems:   ROS Pertinent items noted in HPI and remainder of comprehensive ROS otherwise negative.     Cardiac Review of Systems: Y or  [    ]= no  Chest Pain [    ]  Resting SOB [   ] Exertional SOB  [  Y]  Orthopnea [ Y ]   Pedal Edema [  Y ]    Palpitations [  ] Syncope  [  ]   Presyncope [   ]  General Review of Systems: [Y] = yes [  ]=no Constitional: recent weight change [  ]; anorexia [  ]; fatigue [  Y]; nausea [  ]; night sweats [  ]; fever [  ]; or chills [  ]                                                               Dental: Has not seen dentist in years but brushes his teeth twice daily and has had not tooth or jaw pain.   Eye : blurred vision [  ]; diplopia [   ]; vision changes [  ];  Amaurosis fugax[  ]; Resp: cough [  ];  wheezing[  ];  hemoptysis[  ]; shortness of breath[ Y ]; paroxysmal nocturnal dyspnea[  ]; dyspnea on exertion[ Y ]; or orthopnea[  Y];  GI:  gallstones[  ], vomiting[  ];  dysphagia[  ]; melena[  ];  hematochezia  [  ]; heartburn[  ];   Hx of  Colonoscopy[  ]; GU: kidney stones [  ]; hematuria[  ];   dysuria [  ];  nocturia[  ];  history of     obstruction [  ]; urinary frequency [  ]             Skin: rash, swelling[  ];, hair loss[  ];  peripheral edema[  ];  or itching[  ]; Musculosketetal: myalgias[  ];  joint swelling[  ];  joint erythema[  ];  joint pain[  ];  back pain[  ];  Heme/Lymph: bruising[  ];  bleeding[  ];  anemia[  ];  Neuro: TIA[  ];  headaches[  ];  stroke[  ];  vertigo[  ];  seizures[  ];   paresthesias[  ];  difficulty walking[  ];  Psych:depression[  ]; anxiety[  ];  Endocrine: diabetes[  ];  thyroid dysfunction[  ];                 Physical Exam: BP 113/84 (BP Location: Left Arm)   Pulse (!) 106   Temp 97.8 F (36.6 C) (Oral)   Resp 19   Ht 5' 10 (1.778 m)   Wt 67.7 kg   SpO2 99%   BMI 21.42 kg/m    General appearance: alert and cooperative Head: Normocephalic, without obvious abnormality, atraumatic Neck: no adenopathy, no carotid bruit, no JVD, supple,  Resp: clear to auscultation bilaterally Cardio: regular rate and rhythm and 3/6 systolic and diastolic murmur at apex. GI: soft, non-tender; bowel sounds normal; no masses,  no organomegaly Extremities: extremities normal, atraumatic, no cyanosis or edema Neurologic: Alert and oriented X 3, normal strength and tone.   Diagnostic Studies & Laboratory data:     Recent Radiology Findings:   EP STUDY Result Date: 11/27/2024 See surgical note for result.  CARDIAC CATHETERIZATION Result Date: 11/27/2024 HEMODYNAMICS: RA:       5 mmHg (mean) RV:       32/2, 5 mmHg PA:       32/17 mmHg (24 mean) PCWP: 14 mmHg (mean), no significant v waves    Estimated Fick CO/CI   4.9L/min, 2.59L/min/m2 Thermodilution CO/CI   3.88L/min, 2.05L/min/m2    TPG  10  mmHg     PVR  2.57 Wood Units PAPi  3  IMPRESSION: Right heart catheterization and coronary angiography for evaluation of severe AR/MR and cardiomyopathy Normal left and right  heart filling pressures Mildly reduced output by thermodilution Normal coronary arteriography RECOMMENDATIONS: Reduced cardiac index concerning given severe AR. Plan for TEE and surgical consultation   MR CARDIAC MORPHOLOGY W WO CONTRAST Result Date: 11/26/2024 CLINICAL DATA:  Clinical question of aortic regurgitation, mitral regurgitation Study assumes HCT of 44 and BSA of 1.88 m2. EXAM: CARDIAC MRI TECHNIQUE: The patient was scanned on a 1.5 Tesla GE magnet. A dedicated cardiac coil was used. Functional imaging was done using Fiesta sequences. 2,3, and 4 chamber views were done to assess for RWMA's. Modified Simpson's rule using was used to calculate an ejection fraction on a dedicated work [employer]. The patient received 10 cc of Gadavist . After 10 minutes inversion recovery sequences were used to assess for infiltration and  scar tissue. Flow quantification was performed 2 times during this examination with flow quantification performed at the levels of the ascending aorta above the valve, pulmonary artery above the valve. CONTRAST:  10 cc  of Gadavist  FINDINGS: 1. Severely dilated left ventricular size, with LVEDD 77 mm, and LVEDVi 219 mL/m2. Normal left ventricular thickness. Moderate decrease in left ventricular systolic function (LVEF =35%). There are no regional wall motion abnormalities but global hypokinesis. Left ventricular parametric mapping notable for normal T2. ECV is elevated (38%) in the mid interventricular septum. There is no late gadolinium enhancement in the left ventricular myocardium. Normal resting perfusion. 2. Dilated right ventricular size with RVEDVI 131 mL/m2. Normal right ventricular thickness. Moderate decrease in right ventricular systolic function (RVEF =30%). Relatively preserved free wall excursion, decrease septal function. 3.  Bi-atrial dilation. 4. Normal size of the aortic root, ascending aorta and pulmonary artery. 5. Valve assessment: Aortic Valve:  Tri-leaflet valve. There is no central valve coaptation. Mean gradient < 1 mm Hg. Average regurgitation fraction 51%. Severe aortic regurgitation. Pulmonic Valve: Mild regurgitation.  Regurgitant fraction 15%. Tricuspid Valve: Qualitatively no significant regurgitation. Mitral Valve: Ventricular functional with posterior restriction. Bi-leaflet thickening. Valve does not appears to coapt in the four chamber view. Regurgitation fraction 45%. Severe regurgitation. 6. Normal pericardium. Small, circumferential pericardial effusion without evidence of increase pericardial pressures. 7. Grossly, no extracardiac findings. Recommended dedicated study if concerned for non-cardiac pathology. IMPRESSION: 1.  Dilated cardiomyopathy with LVEF 35% but without LGE. 2.  Dilated right ventricle with RVEF 30%. 3.  Severe aortic regurgitation. 4.  Severe mitral regurgitation. Stanly Leavens MD Electronically Signed   By: Stanly Leavens M.D.   On: 11/26/2024 13:53   MR CARDIAC VELOCITY FLOW MAP Result Date: 11/26/2024 CLINICAL DATA:  Clinical question of aortic regurgitation, mitral regurgitation Study assumes HCT of 44 and BSA of 1.88 m2. EXAM: CARDIAC MRI TECHNIQUE: The patient was scanned on a 1.5 Tesla GE magnet. A dedicated cardiac coil was used. Functional imaging was done using Fiesta sequences. 2,3, and 4 chamber views were done to assess for RWMA's. Modified Simpson's rule using was used to calculate an ejection fraction on a dedicated work [employer], [employer]. The patient received 10 cc of Gadavist . After 10 minutes inversion recovery sequences were used to assess for infiltration and scar tissue. Flow quantification was performed 2 times during this examination with flow quantification performed at the levels of the ascending aorta above the valve, pulmonary artery above the valve. CONTRAST:  10 cc  of Gadavist  FINDINGS: 1. Severely dilated left ventricular size, with LVEDD 77 mm, and LVEDVi 219  mL/m2. Normal left ventricular thickness. Moderate decrease in left ventricular systolic function (LVEF =35%). There are no regional wall motion abnormalities but global hypokinesis. Left ventricular parametric mapping notable for normal T2. ECV is elevated (38%) in the mid interventricular septum. There is no late gadolinium enhancement in the left ventricular myocardium. Normal resting perfusion. 2. Dilated right ventricular size with RVEDVI 131 mL/m2. Normal right ventricular thickness. Moderate decrease in right ventricular systolic function (RVEF =30%). Relatively preserved free wall excursion, decrease septal function. 3.  Bi-atrial dilation. 4. Normal size of the aortic root, ascending aorta and pulmonary artery. 5. Valve assessment: Aortic Valve: Tri-leaflet valve. There is no central valve coaptation. Mean gradient < 1 mm Hg. Average regurgitation fraction 51%. Severe aortic regurgitation. Pulmonic Valve: Mild regurgitation.  Regurgitant fraction 15%. Tricuspid Valve: Qualitatively no significant regurgitation. Mitral Valve: Ventricular functional with posterior restriction. Bi-leaflet thickening.  Valve does not appears to coapt in the four chamber view. Regurgitation fraction 45%. Severe regurgitation. 6. Normal pericardium. Small, circumferential pericardial effusion without evidence of increase pericardial pressures. 7. Grossly, no extracardiac findings. Recommended dedicated study if concerned for non-cardiac pathology. IMPRESSION: 1.  Dilated cardiomyopathy with LVEF 35% but without LGE. 2.  Dilated right ventricle with RVEF 30%. 3.  Severe aortic regurgitation. 4.  Severe mitral regurgitation. Stanly Leavens MD Electronically Signed   By: Stanly Leavens M.D.   On: 11/26/2024 13:53   MR CARDIAC VELOCITY FLOW MAP Result Date: 11/26/2024 CLINICAL DATA:  Clinical question of aortic regurgitation, mitral regurgitation Study assumes HCT of 44 and BSA of 1.88 m2. EXAM: CARDIAC MRI TECHNIQUE:  The patient was scanned on a 1.5 Tesla GE magnet. A dedicated cardiac coil was used. Functional imaging was done using Fiesta sequences. 2,3, and 4 chamber views were done to assess for RWMA's. Modified Simpson's rule using was used to calculate an ejection fraction on a dedicated work [employer]. The patient received 10 cc of Gadavist . After 10 minutes inversion recovery sequences were used to assess for infiltration and scar tissue. Flow quantification was performed 2 times during this examination with flow quantification performed at the levels of the ascending aorta above the valve, pulmonary artery above the valve. CONTRAST:  10 cc  of Gadavist  FINDINGS: 1. Severely dilated left ventricular size, with LVEDD 77 mm, and LVEDVi 219 mL/m2. Normal left ventricular thickness. Moderate decrease in left ventricular systolic function (LVEF =35%). There are no regional wall motion abnormalities but global hypokinesis. Left ventricular parametric mapping notable for normal T2. ECV is elevated (38%) in the mid interventricular septum. There is no late gadolinium enhancement in the left ventricular myocardium. Normal resting perfusion. 2. Dilated right ventricular size with RVEDVI 131 mL/m2. Normal right ventricular thickness. Moderate decrease in right ventricular systolic function (RVEF =30%). Relatively preserved free wall excursion, decrease septal function. 3.  Bi-atrial dilation. 4. Normal size of the aortic root, ascending aorta and pulmonary artery. 5. Valve assessment: Aortic Valve: Tri-leaflet valve. There is no central valve coaptation. Mean gradient < 1 mm Hg. Average regurgitation fraction 51%. Severe aortic regurgitation. Pulmonic Valve: Mild regurgitation.  Regurgitant fraction 15%. Tricuspid Valve: Qualitatively no significant regurgitation. Mitral Valve: Ventricular functional with posterior restriction. Bi-leaflet thickening. Valve does not appears to coapt in the four chamber view.  Regurgitation fraction 45%. Severe regurgitation. 6. Normal pericardium. Small, circumferential pericardial effusion without evidence of increase pericardial pressures. 7. Grossly, no extracardiac findings. Recommended dedicated study if concerned for non-cardiac pathology. IMPRESSION: 1.  Dilated cardiomyopathy with LVEF 35% but without LGE. 2.  Dilated right ventricle with RVEF 30%. 3.  Severe aortic regurgitation. 4.  Severe mitral regurgitation. Stanly Leavens MD Electronically Signed   By: Stanly Leavens M.D.   On: 11/26/2024 13:53     I have independently reviewed the above radiologic studies and discussed with the patient   Recent Lab Findings: Lab Results  Component Value Date   WBC 10.6 (H) 11/22/2024   HGB 17.0 11/27/2024   HGB 17.3 (H) 11/27/2024   HCT 50.0 11/27/2024   HCT 51.0 11/27/2024   PLT 206 11/22/2024   GLUCOSE 92 11/27/2024   CHOL 149 11/27/2024   TRIG 82 11/27/2024   HDL 40 (L) 11/27/2024   LDLCALC 93 11/27/2024   ALT 64 (H) 11/25/2024   AST 41 11/25/2024   NA 137 11/27/2024   NA 138 11/27/2024   K 3.8 11/27/2024  K 3.9 11/27/2024   CL 96 (L) 11/27/2024   CREATININE 1.24 11/27/2024   BUN 21 (H) 11/27/2024   CO2 33 (H) 11/27/2024   TSH 1.860 11/22/2024   INR 1.1 11/23/2024   HGBA1C 6.1 (H) 11/27/2024      Assessment / Plan:      This 44 year old gentleman has severe aortic insufficiency and severe mitral regurgitation with an ejection fraction of around 35% presenting with acute systolic congestive heart failure.  He has normal coronary anatomy.  I suspect that his primary abnormality was longstanding aortic insufficiency leading to left ventricular dilation and dysfunction with secondary mitral regurgitation due to leaflet tethering and restriction.  His left ventricular dysfunction could also be complicated by his history of heavy alcohol use.  He has improved quickly with IV diuresis.  I think the options for treating him are either aortic  and mitral valve replacement with bioprosthetic valves with planned Impella support or proceeding with LVAD workup.  Even with LVAD surgery he would require aortic valve replacement and probably mitral valve replacement given the degree of regurgitation.  I do not think he is a transplant candidate at this time given his ongoing smoking and alcohol use.  I think the best option is to proceed with aortic and mitral valve replacements with planned LV Impella support with the hope that his left ventricle will recover with Impella followed by GDMT.  It is still possible he may require a durable LVAD or transplant if his left ventricular function does not recover.  I discussed all of this with him and his wife and answered all their questions. I discussed the operative procedure with the patient and his wife including alternatives, benefits and risks; including but not limited to bleeding, blood transfusion, infection, stroke, myocardial infarction, graft failure, heart block requiring a permanent pacemaker, organ dysfunction, and death.  Daman Steffenhagen Kinter understands and agrees to proceed.  We will schedule surgery for Monday morning.     Dorise LOIS Fellers, MD 11/27/2024 3:46 PM

## 2024-11-27 NOTE — H&P (View-Only) (Signed)
 [address]       [place] [address]             [phone]        [facility] Medical Record #[id number] Date of Birth: 1981/10/20  Referring: Morene Brownie, MD Primary Care: Patient, No Pcp Per Primary Cardiologist: Sheppard Sierras ( new)  Chief Complaint: Severe AI and MR with acute systolic heart failure   History of Present Illness:      The patient is a 43 year old gentleman with a history of hypertension, 1/2 pack/day smoking, heavy alcohol use (estimated 5 shots per day), history of asthma, and a strong family history of heart disease who presented with worsening shortness of breath and orthopnea as well as lower extremity edema that has progressed over the last several weeks.  He was initially treated for pneumonia with antibiotics without improvement.  In the emergency department his proBNP was 3354.  Chest x-ray showed cardiomegaly but no pulmonary edema or pleural effusions.  Electrocardiogram showed sinus tachycardia at 114 with anterior septal Q waves and left atrial enlargement. Hs troponin was 27>27.  He was admitted and started on IV Lasix .  2D echocardiogram showed a left ventricular ejection fraction of 35 to 40% with severely reduced RV systolic function, severe mitral regurgitation, and severe aortic insufficiency.  There is some nodular thickening at the leaflet tips.  The patient had no fever or chills and white blood cell count was 10.6 on admission.  There is no history of drug abuse. BC are negative.  Left ventricular diastolic diameter was 7.2 cm with a systolic diameter of 5.8 cm.  A cardiac MRI showed a diastolic diameter of 7.7 cm.  Left ventricular ejection fraction was 35% with global hypokinesis.  There was no LGE.  RVEF was 30%.  The aortic root and ascending aorta were normal.  The aortic valve was trileaflet with lack of central coaptation.  The regurgitant fraction was 51% consistent with severe AI.  The mitral valve had  bileaflet thickening with lack of coaptation of the leaflets and regurgitant fraction of 45% consistent with severe regurgitation.  Cardiac catheterization today showed normal coronary anatomy.  Cardiac index by thermodilution was 2.0.  Left and right heart pressures were within normal limits. PAPi was 3.  The patient's wife is with him.  He works full-time as a estate agent.    Past Medical History:  Diagnosis Date   Asthma     Past Surgical History:  Procedure Laterality Date   FRACTURE SURGERY      Social History   Tobacco Use  Smoking Status Every Day   Current packs/day: 1.00   Types: Cigarettes  Smokeless Tobacco Never    Social History   Substance and Sexual Activity  Alcohol Use Yes   Alcohol/week: 4.0 standard drinks of alcohol   Types: 4 Shots of liquor per week   Comment: every day      No Known Allergies  Current Facility-Administered Medications  Medication Dose Route Frequency Provider Last Rate Last Admin   acetaminophen  (TYLENOL ) tablet 650 mg  650 mg Oral Q6H PRN Tat, Alm, MD   650 mg at 11/27/24 0133   albuterol  (PROVENTIL ) (2.5 MG/3ML) 0.083% nebulizer solution 2.5 mg  2.5 mg Nebulization Q4H PRN Tat, David, MD       folic acid  (FOLVITE ) tablet 1 mg  1 mg Oral Daily Tat, David, MD   1 mg at 11/26/24 9061   gadobutrol  (  GADAVIST ) 1 MMOL/ML injection 10 mL  10 mL Intravenous Once PRN Lee, Jordan, NP       losartan  (COZAAR ) tablet 25 mg  25 mg Oral Daily Stoner, Benjamin J, MD   25 mg at 11/26/24 9061   multivitamin with minerals tablet 1 tablet  1 tablet Oral Daily Tat, David, MD   1 tablet at 11/26/24 9061   ondansetron  (ZOFRAN ) tablet 4 mg  4 mg Oral Q6H PRN Tat, Alm, MD       Or   ondansetron  (ZOFRAN ) injection 4 mg  4 mg Intravenous Q6H PRN Tat, Alm, MD       Oral care mouth rinse  15 mL Mouth Rinse PRN Bryn Bernardino NOVAK, MD       polyethylene glycol (MIRALAX  / GLYCOLAX ) packet 17 g  17 g Oral Daily PRN Tat, Alm, MD       spironolactone   (ALDACTONE ) tablet 25 mg  25 mg Oral Daily Stoner, Benjamin J, MD   25 mg at 11/26/24 9061   thiamine  (VITAMIN B1) tablet 100 mg  100 mg Oral Daily Tat, David, MD   100 mg at 11/26/24 9061    Medications Prior to Admission  Medication Sig Dispense Refill Last Dose/Taking   albuterol  (VENTOLIN  HFA) 108 (90 Base) MCG/ACT inhaler Inhale 2 puffs into the lungs every 4 (four) hours as needed. 18 g 0 11/22/2024 Noon   guaiFENesin (MUCINEX) 600 MG 12 hr tablet Take 1,200 mg by mouth 2 (two) times daily as needed for to loosen phlegm or cough.   Past Month   ibuprofen  (ADVIL ) 800 MG tablet Take 1 tablet (800 mg total) by mouth every 8 (eight) hours as needed for moderate pain. 20 tablet 0 Unknown   azithromycin  (ZITHROMAX ) 250 MG tablet Take 250 mg by mouth as directed. (Patient not taking: Reported on 11/23/2024)   Not Taking    Family History  Problem Relation Age of Onset   Stroke Mother    Hypertension Mother    Diabetes Mother      Review of Systems:   ROS Pertinent items noted in HPI and remainder of comprehensive ROS otherwise negative.     Cardiac Review of Systems: Y or  [    ]= no  Chest Pain [    ]  Resting SOB [   ] Exertional SOB  [  Y]  Orthopnea [ Y ]   Pedal Edema [  Y ]    Palpitations [  ] Syncope  [  ]   Presyncope [   ]  General Review of Systems: [Y] = yes [  ]=no Constitional: recent weight change [  ]; anorexia [  ]; fatigue [  Y]; nausea [  ]; night sweats [  ]; fever [  ]; or chills [  ]                                                               Dental: Has not seen dentist in years but brushes his teeth twice daily and has had not tooth or jaw pain.   Eye : blurred vision [  ]; diplopia [   ]; vision changes [  ];  Amaurosis fugax[  ]; Resp: cough [  ];  wheezing[  ];  hemoptysis[  ]; shortness of breath[ Y ]; paroxysmal nocturnal dyspnea[  ]; dyspnea on exertion[ Y ]; or orthopnea[  Y];  GI:  gallstones[  ], vomiting[  ];  dysphagia[  ]; melena[  ];  hematochezia  [  ]; heartburn[  ];   Hx of  Colonoscopy[  ]; GU: kidney stones [  ]; hematuria[  ];   dysuria [  ];  nocturia[  ];  history of     obstruction [  ]; urinary frequency [  ]             Skin: rash, swelling[  ];, hair loss[  ];  peripheral edema[  ];  or itching[  ]; Musculosketetal: myalgias[  ];  joint swelling[  ];  joint erythema[  ];  joint pain[  ];  back pain[  ];  Heme/Lymph: bruising[  ];  bleeding[  ];  anemia[  ];  Neuro: TIA[  ];  headaches[  ];  stroke[  ];  vertigo[  ];  seizures[  ];   paresthesias[  ];  difficulty walking[  ];  Psych:depression[  ]; anxiety[  ];  Endocrine: diabetes[  ];  thyroid dysfunction[  ];                 Physical Exam: BP 113/84 (BP Location: Left Arm)   Pulse (!) 106   Temp 97.8 F (36.6 C) (Oral)   Resp 19   Ht 5' 10 (1.778 m)   Wt 67.7 kg   SpO2 99%   BMI 21.42 kg/m    General appearance: alert and cooperative Head: Normocephalic, without obvious abnormality, atraumatic Neck: no adenopathy, no carotid bruit, no JVD, supple,  Resp: clear to auscultation bilaterally Cardio: regular rate and rhythm and 3/6 systolic and diastolic murmur at apex. GI: soft, non-tender; bowel sounds normal; no masses,  no organomegaly Extremities: extremities normal, atraumatic, no cyanosis or edema Neurologic: Alert and oriented X 3, normal strength and tone.   Diagnostic Studies & Laboratory data:     Recent Radiology Findings:   EP STUDY Result Date: 11/27/2024 See surgical note for result.  CARDIAC CATHETERIZATION Result Date: 11/27/2024 HEMODYNAMICS: RA:       5 mmHg (mean) RV:       32/2, 5 mmHg PA:       32/17 mmHg (24 mean) PCWP: 14 mmHg (mean), no significant v waves    Estimated Fick CO/CI   4.9L/min, 2.59L/min/m2 Thermodilution CO/CI   3.88L/min, 2.05L/min/m2    TPG  10  mmHg     PVR  2.57 Wood Units PAPi  3  IMPRESSION: Right heart catheterization and coronary angiography for evaluation of severe AR/MR and cardiomyopathy Normal left and right  heart filling pressures Mildly reduced output by thermodilution Normal coronary arteriography RECOMMENDATIONS: Reduced cardiac index concerning given severe AR. Plan for TEE and surgical consultation   MR CARDIAC MORPHOLOGY W WO CONTRAST Result Date: 11/26/2024 CLINICAL DATA:  Clinical question of aortic regurgitation, mitral regurgitation Study assumes HCT of 44 and BSA of 1.88 m2. EXAM: CARDIAC MRI TECHNIQUE: The patient was scanned on a 1.5 Tesla GE magnet. A dedicated cardiac coil was used. Functional imaging was done using Fiesta sequences. 2,3, and 4 chamber views were done to assess for RWMA's. Modified Simpson's rule using was used to calculate an ejection fraction on a dedicated work [employer]. The patient received 10 cc of Gadavist . After 10 minutes inversion recovery sequences were used to assess for infiltration and  scar tissue. Flow quantification was performed 2 times during this examination with flow quantification performed at the levels of the ascending aorta above the valve, pulmonary artery above the valve. CONTRAST:  10 cc  of Gadavist  FINDINGS: 1. Severely dilated left ventricular size, with LVEDD 77 mm, and LVEDVi 219 mL/m2. Normal left ventricular thickness. Moderate decrease in left ventricular systolic function (LVEF =35%). There are no regional wall motion abnormalities but global hypokinesis. Left ventricular parametric mapping notable for normal T2. ECV is elevated (38%) in the mid interventricular septum. There is no late gadolinium enhancement in the left ventricular myocardium. Normal resting perfusion. 2. Dilated right ventricular size with RVEDVI 131 mL/m2. Normal right ventricular thickness. Moderate decrease in right ventricular systolic function (RVEF =30%). Relatively preserved free wall excursion, decrease septal function. 3.  Bi-atrial dilation. 4. Normal size of the aortic root, ascending aorta and pulmonary artery. 5. Valve assessment: Aortic Valve:  Tri-leaflet valve. There is no central valve coaptation. Mean gradient < 1 mm Hg. Average regurgitation fraction 51%. Severe aortic regurgitation. Pulmonic Valve: Mild regurgitation.  Regurgitant fraction 15%. Tricuspid Valve: Qualitatively no significant regurgitation. Mitral Valve: Ventricular functional with posterior restriction. Bi-leaflet thickening. Valve does not appears to coapt in the four chamber view. Regurgitation fraction 45%. Severe regurgitation. 6. Normal pericardium. Small, circumferential pericardial effusion without evidence of increase pericardial pressures. 7. Grossly, no extracardiac findings. Recommended dedicated study if concerned for non-cardiac pathology. IMPRESSION: 1.  Dilated cardiomyopathy with LVEF 35% but without LGE. 2.  Dilated right ventricle with RVEF 30%. 3.  Severe aortic regurgitation. 4.  Severe mitral regurgitation. Stanly Leavens MD Electronically Signed   By: Stanly Leavens M.D.   On: 11/26/2024 13:53   MR CARDIAC VELOCITY FLOW MAP Result Date: 11/26/2024 CLINICAL DATA:  Clinical question of aortic regurgitation, mitral regurgitation Study assumes HCT of 44 and BSA of 1.88 m2. EXAM: CARDIAC MRI TECHNIQUE: The patient was scanned on a 1.5 Tesla GE magnet. A dedicated cardiac coil was used. Functional imaging was done using Fiesta sequences. 2,3, and 4 chamber views were done to assess for RWMA's. Modified Simpson's rule using was used to calculate an ejection fraction on a dedicated work [employer], [employer]. The patient received 10 cc of Gadavist . After 10 minutes inversion recovery sequences were used to assess for infiltration and scar tissue. Flow quantification was performed 2 times during this examination with flow quantification performed at the levels of the ascending aorta above the valve, pulmonary artery above the valve. CONTRAST:  10 cc  of Gadavist  FINDINGS: 1. Severely dilated left ventricular size, with LVEDD 77 mm, and LVEDVi 219  mL/m2. Normal left ventricular thickness. Moderate decrease in left ventricular systolic function (LVEF =35%). There are no regional wall motion abnormalities but global hypokinesis. Left ventricular parametric mapping notable for normal T2. ECV is elevated (38%) in the mid interventricular septum. There is no late gadolinium enhancement in the left ventricular myocardium. Normal resting perfusion. 2. Dilated right ventricular size with RVEDVI 131 mL/m2. Normal right ventricular thickness. Moderate decrease in right ventricular systolic function (RVEF =30%). Relatively preserved free wall excursion, decrease septal function. 3.  Bi-atrial dilation. 4. Normal size of the aortic root, ascending aorta and pulmonary artery. 5. Valve assessment: Aortic Valve: Tri-leaflet valve. There is no central valve coaptation. Mean gradient < 1 mm Hg. Average regurgitation fraction 51%. Severe aortic regurgitation. Pulmonic Valve: Mild regurgitation.  Regurgitant fraction 15%. Tricuspid Valve: Qualitatively no significant regurgitation. Mitral Valve: Ventricular functional with posterior restriction. Bi-leaflet thickening.  Valve does not appears to coapt in the four chamber view. Regurgitation fraction 45%. Severe regurgitation. 6. Normal pericardium. Small, circumferential pericardial effusion without evidence of increase pericardial pressures. 7. Grossly, no extracardiac findings. Recommended dedicated study if concerned for non-cardiac pathology. IMPRESSION: 1.  Dilated cardiomyopathy with LVEF 35% but without LGE. 2.  Dilated right ventricle with RVEF 30%. 3.  Severe aortic regurgitation. 4.  Severe mitral regurgitation. Stanly Leavens MD Electronically Signed   By: Stanly Leavens M.D.   On: 11/26/2024 13:53   MR CARDIAC VELOCITY FLOW MAP Result Date: 11/26/2024 CLINICAL DATA:  Clinical question of aortic regurgitation, mitral regurgitation Study assumes HCT of 44 and BSA of 1.88 m2. EXAM: CARDIAC MRI TECHNIQUE:  The patient was scanned on a 1.5 Tesla GE magnet. A dedicated cardiac coil was used. Functional imaging was done using Fiesta sequences. 2,3, and 4 chamber views were done to assess for RWMA's. Modified Simpson's rule using was used to calculate an ejection fraction on a dedicated work [employer]. The patient received 10 cc of Gadavist . After 10 minutes inversion recovery sequences were used to assess for infiltration and scar tissue. Flow quantification was performed 2 times during this examination with flow quantification performed at the levels of the ascending aorta above the valve, pulmonary artery above the valve. CONTRAST:  10 cc  of Gadavist  FINDINGS: 1. Severely dilated left ventricular size, with LVEDD 77 mm, and LVEDVi 219 mL/m2. Normal left ventricular thickness. Moderate decrease in left ventricular systolic function (LVEF =35%). There are no regional wall motion abnormalities but global hypokinesis. Left ventricular parametric mapping notable for normal T2. ECV is elevated (38%) in the mid interventricular septum. There is no late gadolinium enhancement in the left ventricular myocardium. Normal resting perfusion. 2. Dilated right ventricular size with RVEDVI 131 mL/m2. Normal right ventricular thickness. Moderate decrease in right ventricular systolic function (RVEF =30%). Relatively preserved free wall excursion, decrease septal function. 3.  Bi-atrial dilation. 4. Normal size of the aortic root, ascending aorta and pulmonary artery. 5. Valve assessment: Aortic Valve: Tri-leaflet valve. There is no central valve coaptation. Mean gradient < 1 mm Hg. Average regurgitation fraction 51%. Severe aortic regurgitation. Pulmonic Valve: Mild regurgitation.  Regurgitant fraction 15%. Tricuspid Valve: Qualitatively no significant regurgitation. Mitral Valve: Ventricular functional with posterior restriction. Bi-leaflet thickening. Valve does not appears to coapt in the four chamber view.  Regurgitation fraction 45%. Severe regurgitation. 6. Normal pericardium. Small, circumferential pericardial effusion without evidence of increase pericardial pressures. 7. Grossly, no extracardiac findings. Recommended dedicated study if concerned for non-cardiac pathology. IMPRESSION: 1.  Dilated cardiomyopathy with LVEF 35% but without LGE. 2.  Dilated right ventricle with RVEF 30%. 3.  Severe aortic regurgitation. 4.  Severe mitral regurgitation. Stanly Leavens MD Electronically Signed   By: Stanly Leavens M.D.   On: 11/26/2024 13:53     I have independently reviewed the above radiologic studies and discussed with the patient   Recent Lab Findings: Lab Results  Component Value Date   WBC 10.6 (H) 11/22/2024   HGB 17.0 11/27/2024   HGB 17.3 (H) 11/27/2024   HCT 50.0 11/27/2024   HCT 51.0 11/27/2024   PLT 206 11/22/2024   GLUCOSE 92 11/27/2024   CHOL 149 11/27/2024   TRIG 82 11/27/2024   HDL 40 (L) 11/27/2024   LDLCALC 93 11/27/2024   ALT 64 (H) 11/25/2024   AST 41 11/25/2024   NA 137 11/27/2024   NA 138 11/27/2024   K 3.8 11/27/2024  K 3.9 11/27/2024   CL 96 (L) 11/27/2024   CREATININE 1.24 11/27/2024   BUN 21 (H) 11/27/2024   CO2 33 (H) 11/27/2024   TSH 1.860 11/22/2024   INR 1.1 11/23/2024   HGBA1C 6.1 (H) 11/27/2024      Assessment / Plan:      This 44 year old gentleman has severe aortic insufficiency and severe mitral regurgitation with an ejection fraction of around 35% presenting with acute systolic congestive heart failure.  He has normal coronary anatomy.  I suspect that his primary abnormality was longstanding aortic insufficiency leading to left ventricular dilation and dysfunction with secondary mitral regurgitation due to leaflet tethering and restriction.  His left ventricular dysfunction could also be complicated by his history of heavy alcohol use.  He has improved quickly with IV diuresis.  I think the options for treating him are either aortic  and mitral valve replacement with bioprosthetic valves with planned Impella support or proceeding with LVAD workup.  Even with LVAD surgery he would require aortic valve replacement and probably mitral valve replacement given the degree of regurgitation.  I do not think he is a transplant candidate at this time given his ongoing smoking and alcohol use.  I think the best option is to proceed with aortic and mitral valve replacements with planned LV Impella support with the hope that his left ventricle will recover with Impella followed by GDMT.  It is still possible he may require a durable LVAD or transplant if his left ventricular function does not recover.  I discussed all of this with him and his wife and answered all their questions. I discussed the operative procedure with the patient and his wife including alternatives, benefits and risks; including but not limited to bleeding, blood transfusion, infection, stroke, myocardial infarction, graft failure, heart block requiring a permanent pacemaker, organ dysfunction, and death.  Daman Steffenhagen Kinter understands and agrees to proceed.  We will schedule surgery for Monday morning.     Dorise LOIS Fellers, MD 11/27/2024 3:46 PM

## 2024-11-27 NOTE — CV Procedure (Signed)
   TRANSESOPHAGEAL ECHOCARDIOGRAM  NAME:  Alejandro Lopez    MRN: [id number] DOB:  1981-09-08    ADMIT DATE: 11/22/2024  INDICATIONS: AR/MR  PROCEDURE:   Informed consent was obtained prior to the procedure. The risks, benefits and alternatives for the procedure were discussed and the patient comprehended these risks.  Risks include, but are not limited to, cough, sore throat, vomiting, nausea, somnolence, esophageal and stomach trauma or perforation, bleeding, low blood pressure, aspiration, pneumonia, infection, trauma to the teeth and death.    After a procedural timeout, the patient was administered propofol  per anesthesia.  The patient's heart rate, blood pressure, and oxygen  saturation were monitored continuously during the procedure. The period of conscious sedation was 20 minutes, of which I was present face-to-face 100% of this time.  The transesophageal probe was inserted in the esophagus and stomach without difficulty and multiple views were obtained.  The patient was kept under observation until the patient left the procedure room.  The patient left the procedure room in stable condition.    COMPLICATIONS:    Complications: No complications.  The patient had normal neuro status and respiratory status post procedure with vitals stable as recorded elsewhere.  Adequate airway was maintained throughout and vital signs monitored per protocol.  KEY FINDINGS:  Severe AR/MR Dilated LV with severely reduced function Moderately reduced RV with moderate TR Full Report to follow.   Morene Brownie Advanced Heart Failure 2:30 PM

## 2024-11-27 NOTE — Progress Notes (Addendum)
 [facility] PROGRESS NOTE  LARNELL GRANLUND (DOB: 14-Sep-1981) FMW:[id number] PCP: None  Brief Narrative: ALVEN ALVERIO is a 43 y.o. male with a history of tobacco use, alcohol abuse, HTN who presented to the ED on 11/22/2024 with swelling in legs extending to scrotum and abdomen as well as dyspnea worse with exertion worsening for > 1 month. Pt was started on IV lasix  with cardiology consultation. Echocardiogram on 12/1 showed LVEF 35-40%, G3DD, severe MR and AI. Patient transferred to [facility] from [facility] for advanced heart failure team management.    Subjective: Saw pt today after cardiac cath, denied any new complaints. Wife at bedside. 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  Avoid beta-blockers due to severe AR Continue strict I/O, weights Telemetry  Transaminitis Improving, possibly due to congestive hepatopathy, alcohol abuse Hepatitis panel negative. RUQ U/S negative. Monitor LFTs  periodically  Prediabetes A1c 6.1 Outpatient follow-up  Tobacco use  Advised to quit  Alcohol abuse No withdrawal noted Advised to quit      [name], MD [facility] [email or web address] 11/27/2024, 1:43 PM

## 2024-11-28 DIAGNOSIS — I5021 Acute systolic (congestive) heart failure: Secondary | ICD-10-CM | POA: Diagnosis not present

## 2024-11-28 LAB — BASIC METABOLIC PANEL WITH GFR
Anion gap: 8 (ref 5–15)
BUN: 22 mg/dL — ABNORMAL HIGH (ref 6–20)
CO2: 27 mmol/L (ref 22–32)
Calcium: 8.6 mg/dL — ABNORMAL LOW (ref 8.9–10.3)
Chloride: 99 mmol/L (ref 98–111)
Creatinine, Ser: 1.05 mg/dL (ref 0.61–1.24)
GFR, Estimated: >60 mL/min (ref >60)
Glucose, Bld: 127 mg/dL — ABNORMAL HIGH (ref 70–99)
Potassium: 3.9 mmol/L (ref 3.5–5.1)
Sodium: 134 mmol/L — ABNORMAL LOW (ref 135–145)

## 2024-11-28 LAB — CBC
HCT: 47.2 % (ref 39.0–52.0)
Hemoglobin: 16.2 g/dL (ref 13.0–17.0)
MCH: 32.4 pg (ref 26.0–34.0)
MCHC: 34.3 g/dL (ref 30.0–36.0)
MCV: 94.4 fL (ref 80.0–100.0)
Platelets: 209 K/uL (ref 150–400)
RBC: 5 MIL/uL (ref 4.22–5.81)
RDW: 13.8 % (ref 11.5–15.5)
WBC: 8.1 K/uL (ref 4.0–10.5)
nRBC: 0 % (ref 0.0–0.2)

## 2024-11-28 NOTE — Plan of Care (Signed)

## 2024-11-28 NOTE — Progress Notes (Signed)
 [facility] PROGRESS NOTE  Alejandro Lopez (DOB: April 18, 1981) FMW:[id number] PCP: None  Brief Narrative: Alejandro Lopez is a 43 y.o. male with a history of tobacco use, alcohol abuse, HTN who presented to the ED on 11/22/2024 with swelling in legs extending to scrotum and abdomen as well as dyspnea worse with exertion worsening for > 1 month. Pt was started on IV lasix  with cardiology consultation. Echocardiogram on 12/1 showed LVEF 35-40%, G3DD, severe MR and AI. Patient transferred to [facility] from [facility] for advanced heart failure team management.    Subjective: Patient denies any new complaints.    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 Avoid beta-blockers due to severe AR Continue strict I/O, weights Telemetry  Transaminitis Improving, possibly due to congestive hepatopathy, alcohol abuse Hepatitis panel negative. RUQ U/S  negative. Monitor LFTs periodically  Prediabetes A1c 6.1 Outpatient follow-up  Tobacco use  Advised to quit  Alcohol abuse No withdrawal noted Advised to quit      [name], MD [facility] [email or web address] 11/28/2024, 2:00 PM

## 2024-11-28 NOTE — Progress Notes (Signed)
 Advanced Heart Failure Rounding Note  Cardiologist: None  Chief Complaint: HF/Valvular Disease Subjective:   Doing well this morning, plan for OR on Monday. Discussed need for social work consult for short term disability. Otherwise no complaints. No hematoma on right wrist after compression.     Objective:   Weight Range: 69.1 kg Body mass index is 21.87 kg/m.   Vital Signs:   Temp:  [97.6 F (36.4 C)-98.5 F (36.9 C)] 98.3 F (36.8 C) (12/06 1121) Pulse Rate:  [95-117] 102 (12/06 1121) Resp:  [12-26] 17 (12/06 0807) BP: (73-135)/(61-98) 122/79 (12/06 1121) SpO2:  [94 %-100 %] 100 % (12/06 0807) Weight:  [69.1 kg] 69.1 kg (12/06 0623) Last BM Date : 11/27/24  Weight change: Filed Weights   11/26/24 0449 11/27/24 0520 11/28/24 0623  Weight: 71.9 kg 67.7 kg 69.1 kg    Intake/Output:   Intake/Output Summary (Last 24 hours) at 11/28/2024 1247 Last data filed at 11/28/2024 0814 Gross per 24 hour  Intake 120 ml  Output 900 ml  Net -780 ml      Physical Exam  GENERAL: NAD, well appearing PULM:  Normal work of breathing, CTAB CARDIAC:  JVP: flat         Tachycardic rate with regular rhythm. Systolic and diastolic murmur.   No edema. Warm and well perfused extremities. ABDOMEN: Soft, non-tender, non-distended. NEUROLOGIC: Patient is oriented x3 with no focal or lateralizing neurologic deficits.     Telemetry  SR   EKG    N/A   Labs    CBC Recent Labs    11/27/24 0830 11/28/24 1107  WBC  --  8.1  HGB 17.3*  17.0 16.2  HCT 51.0  50.0 47.2  MCV  --  94.4  PLT  --  209   Basic Metabolic Panel Recent Labs    87/95/74 0724 11/27/24 0205 11/27/24 0830 11/28/24 0137  NA  --  139 138  137 134*  K  --  4.1 3.9  3.8 3.9  CL  --  96*  --  99  CO2  --  33*  --  27  GLUCOSE  --  92  --  127*  BUN  --  21*  --  22*  CREATININE  --  1.24  --  1.05  CALCIUM   --  9.1  --  8.6*  MG 1.7 2.3  --   --    Liver Function Tests No results for  input(s): AST, ALT, ALKPHOS, BILITOT, PROT, ALBUMIN  in the last 72 hours.  No results for input(s): LIPASE, AMYLASE in the last 72 hours. Cardiac Enzymes No results for input(s): CKTOTAL, CKMB, CKMBINDEX, TROPONINI in the last 72 hours.  BNP: BNP (last 3 results) No results for input(s): BNP in the last 8760 hours.  ProBNP (last 3 results) Recent Labs    11/22/24 1958  PROBNP 3,354.0*     D-Dimer No results for input(s): DDIMER in the last 72 hours. Hemoglobin A1C Recent Labs    11/27/24 0205  HGBA1C 6.1*   Fasting Lipid Panel Recent Labs    11/27/24 0205  CHOL 149  HDL 40*  LDLCALC 93  TRIG 82  CHOLHDL 3.7   Thyroid Function Tests No results for input(s): TSH, T4TOTAL, T3FREE, THYROIDAB in the last 72 hours.  Invalid input(s): FREET3  Other results:    Medications:     Scheduled Medications:  folic acid   1 mg Oral Daily   losartan   25 mg Oral Daily  multivitamin with minerals  1 tablet Oral Daily   spironolactone   25 mg Oral Daily   thiamine   100 mg Oral Daily    Infusions:   PRN Medications: acetaminophen , albuterol , gadobutrol , ondansetron  **OR** ondansetron  (ZOFRAN ) IV, mouth rinse, polyethylene glycol    Patient Profile   43 y.o. male w/ h/o heavy alcohol use (at least 5 drinks per evening) and h/o asthma admitted w/ acute systolic heart failure. Strong family history of cardiac disease: mother and maternal grandmother had heart failure and father with CAD.   HFrEF. Severe valvular diease. HF Team consulted.   Assessment/Plan  1. 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 LVEF 35%  - Cath- normal cors, RA5, PA 32/17 (24), PVR 2.57 Papi 3. CO 4.9 CI 3.8 -  Hold further losartan  in anticipation of surgery, off SGLT2 - Continue  spironolactone  to 25 mg daily  - Hold off on SGLT2i - no ? blocker with tachycardia, suspect compensatory - Plan for OR Monday  2. Valvular Disease  - severe MR posteriorly directed, mod TR. Likely functional. HF optimization per above - severe AI. 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 Lucas with likely impella support on Monday   3. Transaminates - Improving - Hepatitis panel negative. RUQ US  unremarkable  - suspect 2/2 CHF/hepatic congestion + chronic ETOH use  - continue diuresis - follow trend    4. ETOH Abuse - heavy drinker, at least 5 drinks per evening - may be contributing to CM, reduction of intake imperative  - CIWA protocol ordered    5. Hypomagnesemia - follow; goal >2   6. DMII  Hgb A1C 6.1   Length of Stay: 5  Morene JINNY Brownie, MD  11/28/2024, 12:47 PM  Advanced Heart Failure Team Pager [phone] (M-F; 7a - 5p)  Please contact [facility] for night-coverage after hours (5p -7a ) and weekends on [email or web address]

## 2024-11-29 ENCOUNTER — Encounter (HOSPITAL_COMMUNITY): Payer: Self-pay | Admitting: Cardiology

## 2024-11-29 DIAGNOSIS — I5021 Acute systolic (congestive) heart failure: Secondary | ICD-10-CM | POA: Diagnosis not present

## 2024-11-29 LAB — CULTURE, BLOOD (ROUTINE X 2)
Culture: NO GROWTH
Culture: NO GROWTH

## 2024-11-29 LAB — BASIC METABOLIC PANEL WITH GFR
Anion gap: 12 (ref 5–15)
BUN: 19 mg/dL (ref 6–20)
CO2: 26 mmol/L (ref 22–32)
Calcium: 9.1 mg/dL (ref 8.9–10.3)
Chloride: 102 mmol/L (ref 98–111)
Creatinine, Ser: 1.1 mg/dL (ref 0.61–1.24)
GFR, Estimated: >60 mL/min (ref >60)
Glucose, Bld: 110 mg/dL — ABNORMAL HIGH (ref 70–99)
Potassium: 4.1 mmol/L (ref 3.5–5.1)
Sodium: 140 mmol/L (ref 135–145)

## 2024-11-29 LAB — COMPREHENSIVE METABOLIC PANEL WITH GFR
ALT: 66 U/L — ABNORMAL HIGH (ref 0–44)
AST: 63 U/L — ABNORMAL HIGH (ref 15–41)
Albumin: 3.1 g/dL — ABNORMAL LOW (ref 3.5–5.0)
Alkaline Phosphatase: 127 U/L — ABNORMAL HIGH (ref 38–126)
Anion gap: 11 (ref 5–15)
BUN: 24 mg/dL — ABNORMAL HIGH (ref 6–20)
CO2: 18 mmol/L — ABNORMAL LOW (ref 22–32)
Calcium: 8.8 mg/dL — ABNORMAL LOW (ref 8.9–10.3)
Chloride: 107 mmol/L (ref 98–111)
Creatinine, Ser: 1.27 mg/dL — ABNORMAL HIGH (ref 0.61–1.24)
GFR, Estimated: >60 mL/min (ref >60)
Glucose, Bld: 102 mg/dL — ABNORMAL HIGH (ref 70–99)
Potassium: 4.8 mmol/L (ref 3.5–5.1)
Sodium: 136 mmol/L (ref 135–145)
Total Bilirubin: 0.9 mg/dL (ref 0.0–1.2)
Total Protein: 6.7 g/dL (ref 6.5–8.1)

## 2024-11-29 LAB — URINALYSIS, ROUTINE W REFLEX MICROSCOPIC
Bilirubin Urine: NEGATIVE
Glucose, UA: NEGATIVE mg/dL
Hgb urine dipstick: NEGATIVE
Ketones, ur: NEGATIVE mg/dL
Leukocytes,Ua: NEGATIVE
Nitrite: NEGATIVE
Protein, ur: 100 mg/dL — AB
Specific Gravity, Urine: 1.019 (ref 1.005–1.030)
pH: 5 (ref 5.0–8.0)

## 2024-11-29 LAB — CBC
HCT: 47.1 % (ref 39.0–52.0)
Hemoglobin: 16.3 g/dL (ref 13.0–17.0)
MCH: 33.2 pg (ref 26.0–34.0)
MCHC: 34.6 g/dL (ref 30.0–36.0)
MCV: 95.9 fL (ref 80.0–100.0)
Platelets: 211 K/uL (ref 150–400)
RBC: 4.91 MIL/uL (ref 4.22–5.81)
RDW: 13.8 % (ref 11.5–15.5)
WBC: 8.3 K/uL (ref 4.0–10.5)
nRBC: 0 % (ref 0.0–0.2)

## 2024-11-29 LAB — SURGICAL PCR SCREEN
MRSA, PCR: NEGATIVE
Staphylococcus aureus: POSITIVE — AB

## 2024-11-29 LAB — PROTIME-INR
INR: 1 (ref 0.8–1.2)
Prothrombin Time: 13.9 s (ref 11.4–15.2)

## 2024-11-29 LAB — ABO/RH: ABO/RH(D): O POS

## 2024-11-29 MED ORDER — PLASMA-LYTE A IV SOLN
INTRAVENOUS | Status: AC
  Filled 2024-11-29: qty 2.5

## 2024-11-29 MED ORDER — CHLORHEXIDINE GLUCONATE CLOTH 2 % EX PADS: 6.0000 | MEDICATED_PAD | Freq: Every day | CUTANEOUS | Status: DC

## 2024-11-29 MED ORDER — CHLORHEXIDINE GLUCONATE 0.12 % MT SOLN
15.0000 mL | Freq: Once | OROMUCOSAL | Status: AC
  Administered 2024-11-30: 15 mL via OROMUCOSAL
  Filled 2024-11-29: qty 15

## 2024-11-29 MED ORDER — MUPIROCIN 2 % EX OINT
1.0000 | TOPICAL_OINTMENT | Freq: Two times a day (BID) | CUTANEOUS | Status: AC
  Administered 2024-11-29 – 2024-12-04 (×8): 1 via NASAL
  Filled 2024-11-29 (×3): qty 22

## 2024-11-29 MED ORDER — MANNITOL 20 % IV SOLN
Freq: Once | INTRAVENOUS | Status: DC
  Filled 2024-11-29 (×2): qty 13

## 2024-11-29 MED ORDER — CEFAZOLIN SODIUM-DEXTROSE 2-4 GM/100ML-% IV SOLN: 2.0000 g | INTRAVENOUS | Status: DC

## 2024-11-29 MED ORDER — TRANEXAMIC ACID (OHS) BOLUS VIA INFUSION
15.0000 mg/kg | INTRAVENOUS | Status: AC
  Administered 2024-11-30: 1042.5 mg via INTRAVENOUS
  Filled 2024-11-29: qty 1043

## 2024-11-29 MED ORDER — HYDRALAZINE HCL 25 MG PO TABS
25.0000 mg | ORAL_TABLET | Freq: Three times a day (TID) | ORAL | Status: AC
  Administered 2024-11-29 (×2): 25 mg via ORAL
  Filled 2024-11-29 (×2): qty 1

## 2024-11-29 MED ORDER — TRANEXAMIC ACID 1000 MG/10ML IV SOLN
1.5000 mg/kg/h | INTRAVENOUS | Status: AC
  Administered 2024-11-30: 1.5 mg/kg/h via INTRAVENOUS
  Filled 2024-11-29 (×2): qty 25

## 2024-11-29 MED ORDER — VANCOMYCIN HCL 1250 MG/250ML IV SOLN
1250.0000 mg | INTRAVENOUS | Status: AC
  Administered 2024-11-30: 1250 mg via INTRAVENOUS
  Filled 2024-11-29 (×3): qty 250

## 2024-11-29 MED ORDER — INSULIN REGULAR(HUMAN) IN NACL 100-0.9 UT/100ML-% IV SOLN
INTRAVENOUS | Status: AC
  Administered 2024-11-30: 1.3 [IU]/h via INTRAVENOUS
  Filled 2024-11-29 (×2): qty 100

## 2024-11-29 MED ORDER — CHLORHEXIDINE GLUCONATE CLOTH 2 % EX PADS
6.0000 | MEDICATED_PAD | Freq: Once | CUTANEOUS | Status: AC
  Administered 2024-11-29: 6 via TOPICAL

## 2024-11-29 MED ORDER — DIAZEPAM 5 MG PO TABS
5.0000 mg | ORAL_TABLET | Freq: Once | ORAL | Status: AC
  Administered 2024-11-30: 5 mg via ORAL
  Filled 2024-11-29: qty 1

## 2024-11-29 MED ORDER — PHENYLEPHRINE HCL-NACL 20-0.9 MG/250ML-% IV SOLN
30.0000 ug/min | INTRAVENOUS | Status: DC
  Filled 2024-11-29: qty 250

## 2024-11-29 MED ORDER — DEXMEDETOMIDINE HCL IN NACL 400 MCG/100ML IV SOLN
0.1000 ug/kg/h | INTRAVENOUS | Status: AC
  Administered 2024-11-30: .3 ug/kg/h via INTRAVENOUS
  Filled 2024-11-29: qty 100

## 2024-11-29 MED ORDER — CEFAZOLIN SODIUM-DEXTROSE 2-4 GM/100ML-% IV SOLN
2.0000 g | INTRAVENOUS | Status: AC
  Administered 2024-11-30 (×2): 2 g via INTRAVENOUS
  Filled 2024-11-29 (×2): qty 100

## 2024-11-29 MED ORDER — BISACODYL 5 MG PO TBEC: 5.0000 mg | DELAYED_RELEASE_TABLET | Freq: Once | ORAL | Status: DC

## 2024-11-29 MED ORDER — NOREPINEPHRINE 4 MG/250ML-% IV SOLN
0.0000 ug/min | INTRAVENOUS | Status: AC
  Administered 2024-11-30: 2 ug/min via INTRAVENOUS
  Filled 2024-11-29: qty 250

## 2024-11-29 MED ORDER — TRANEXAMIC ACID (OHS) PUMP PRIME SOLUTION
2.0000 mg/kg | INTRAVENOUS | Status: DC
  Filled 2024-11-29 (×2): qty 1.39

## 2024-11-29 MED ORDER — METOPROLOL TARTRATE 12.5 MG HALF TABLET
12.5000 mg | ORAL_TABLET | Freq: Once | ORAL | Status: AC
  Administered 2024-11-30: 12.5 mg via ORAL
  Filled 2024-11-29: qty 1

## 2024-11-29 MED ORDER — MAGNESIUM SULFATE 50 % IJ SOLN: 40.0000 meq | INTRAMUSCULAR | Status: DC

## 2024-11-29 MED ORDER — POTASSIUM CHLORIDE 2 MEQ/ML IV SOLN
80.0000 meq | INTRAVENOUS | Status: DC
  Filled 2024-11-29: qty 40

## 2024-11-29 MED ORDER — NITROGLYCERIN IN D5W 200-5 MCG/ML-% IV SOLN: 2.0000 ug/min | INTRAVENOUS | Status: DC

## 2024-11-29 MED ORDER — MILRINONE LACTATE IN DEXTROSE 20-5 MG/100ML-% IV SOLN
0.3000 ug/kg/min | INTRAVENOUS | Status: AC
  Administered 2024-11-30: .25 ug/kg/min via INTRAVENOUS
  Filled 2024-11-29: qty 100

## 2024-11-29 MED ORDER — TEMAZEPAM 15 MG PO CAPS: 15.0000 mg | ORAL_CAPSULE | Freq: Once | ORAL | Status: DC | PRN

## 2024-11-29 MED ORDER — EPINEPHRINE HCL 5 MG/250ML IV SOLN IN NS
0.0000 ug/min | INTRAVENOUS | Status: AC
  Administered 2024-11-30: 2 ug/min via INTRAVENOUS
  Filled 2024-11-29: qty 250

## 2024-11-29 MED ORDER — HEPARIN 30,000 UNITS/1000 ML (OHS) CELLSAVER SOLUTION
Status: DC
  Filled 2024-11-29: qty 1000

## 2024-11-29 NOTE — Plan of Care (Signed)
  Problem: Education: Goal: Knowledge of General Education information will improve Description: Including pain rating scale, medication(s)/side effects and non-pharmacologic comfort measures Outcome: Progressing   Problem: Health Behavior/Discharge Planning: Goal: Ability to manage health-related needs will improve Outcome: Progressing   Problem: Clinical Measurements: Goal: Ability to maintain clinical measurements within normal limits will improve Outcome: Progressing Goal: Respiratory complications will improve Outcome: Progressing   Problem: Nutrition: Goal: Adequate nutrition will be maintained Outcome: Progressing   Problem: Coping: Goal: Level of anxiety will decrease Outcome: Progressing   Problem: Elimination: Goal: Will not experience complications related to bowel motility Outcome: Progressing Goal: Will not experience complications related to urinary retention Outcome: Progressing   Problem: Pain Managment: Goal: General experience of comfort will improve and/or be controlled Outcome: Progressing   Problem: Education: Goal: Ability to demonstrate management of disease process will improve Outcome: Progressing Goal: Ability to verbalize understanding of medication therapies will improve Outcome: Progressing   Problem: Cardiovascular: Goal: Ability to achieve and maintain adequate cardiovascular perfusion will improve Outcome: Progressing   Problem: Health Behavior/Discharge Planning: Goal: Ability to safely manage health-related needs after discharge will improve Outcome: Progressing   Problem: Activity: Goal: Ability to return to baseline activity level will improve Outcome: Progressing

## 2024-11-29 NOTE — Progress Notes (Signed)
 Advanced Heart Failure Rounding Note  Cardiologist: None  Chief Complaint: HF/Valvular Disease Subjective:   Discussed surgery plan. Apprehensive but feels ready. BP elevated today, losartan  help with upcoming surgery, start hydralazine  25mg  BID.     Objective:   Weight Range: 69.5 kg Body mass index is 21.98 kg/m.   Vital Signs:   Temp:  [97.4 F (36.3 C)-98.8 F (37.1 C)] 98.7 F (37.1 C) (12/07 1223) Pulse Rate:  [103-120] 107 (12/07 1223) Resp:  [16-20] 20 (12/07 1223) BP: (105-144)/(68-89) 134/83 (12/07 1223) SpO2:  [96 %-100 %] 96 % (12/07 1223) Weight:  [69.5 kg-69.7 kg] 69.5 kg (12/07 0914) Last BM Date : 11/27/24  Weight change: Filed Weights   11/28/24 0623 11/29/24 0314 11/29/24 0914  Weight: 69.1 kg 69.7 kg 69.5 kg    Intake/Output:   Intake/Output Summary (Last 24 hours) at 11/29/2024 1230 Last data filed at 11/29/2024 1223 Gross per 24 hour  Intake 120 ml  Output 1425 ml  Net -1305 ml      Physical Exam  GENERAL: NAD, well appearing PULM:  normal WOB CARDIAC:  JVP: flat         tachycardic rate with regular rhythm. Systolic and diastolic murmur.   No edema. Warm and well perfused extremities. ABDOMEN: Soft, non-tender, non-distended. NEUROLOGIC: Patient is oriented x3 with no focal or lateralizing neurologic deficits.     Telemetry  ST   EKG    N/A   Labs    CBC Recent Labs    11/28/24 1107 11/29/24 0345  WBC 8.1 8.3  HGB 16.2 16.3  HCT 47.2 47.1  MCV 94.4 95.9  PLT 209 211   Basic Metabolic Panel Recent Labs    87/94/74 0205 11/27/24 0830 11/28/24 0137 11/29/24 0345  NA 139   < > 134* 140  K 4.1   < > 3.9 4.1  CL 96*  --  99 102  CO2 33*  --  27 26  GLUCOSE 92  --  127* 110*  BUN 21*  --  22* 19  CREATININE 1.24  --  1.05 1.10  CALCIUM  9.1  --  8.6* 9.1  MG 2.3  --   --   --    < > = values in this interval not displayed.   Liver Function Tests No results for input(s): AST, ALT, ALKPHOS, BILITOT,  PROT, ALBUMIN  in the last 72 hours.  No results for input(s): LIPASE, AMYLASE in the last 72 hours. Cardiac Enzymes No results for input(s): CKTOTAL, CKMB, CKMBINDEX, TROPONINI in the last 72 hours.  BNP: BNP (last 3 results) No results for input(s): BNP in the last 8760 hours.  ProBNP (last 3 results) Recent Labs    11/22/24 1958  PROBNP 3,354.0*     D-Dimer No results for input(s): DDIMER in the last 72 hours. Hemoglobin A1C Recent Labs    11/27/24 0205  HGBA1C 6.1*   Fasting Lipid Panel Recent Labs    11/27/24 0205  CHOL 149  HDL 40*  LDLCALC 93  TRIG 82  CHOLHDL 3.7   Thyroid Function Tests No results for input(s): TSH, T4TOTAL, T3FREE, THYROIDAB in the last 72 hours.  Invalid input(s): FREET3  Other results:    Medications:     Scheduled Medications:  [START ON 11/30/2024] epinephrine   0-10 mcg/min Intravenous To OR   folic acid   1 mg Oral Daily   [START ON 11/30/2024] heparin  sodium (porcine) 2,500 Units, papaverine 30 mg in electrolyte-A (PLASMALYTE-A PH 7.4) 500 mL  irrigation   Irrigation To OR   hydrALAZINE   25 mg Oral Q8H   [START ON 11/30/2024] insulin    Intravenous To OR   multivitamin with minerals  1 tablet Oral Daily   [START ON 11/30/2024] phenylephrine   30-200 mcg/min Intravenous To OR   [START ON 11/30/2024] potassium chloride   80 mEq Other To OR   spironolactone   25 mg Oral Daily   thiamine   100 mg Oral Daily   [START ON 11/30/2024] tranexamic acid   15 mg/kg Intravenous To OR   [START ON 11/30/2024] tranexamic acid   2 mg/kg Intracatheter To OR    Infusions:  [START ON 11/30/2024]  ceFAZolin  (ANCEF ) IV     [START ON 11/30/2024]  ceFAZolin  (ANCEF ) IV     [START ON 11/30/2024] dexmedetomidine      [START ON 11/30/2024] heparin  30,000 units/NS 1000 mL solution for CELLSAVER     [START ON 11/30/2024] milrinone      [START ON 11/30/2024] nitroGLYCERIN      [START ON 11/30/2024] norepinephrine      [START ON 11/30/2024]  tranexamic acid  (CYKLOKAPRON ) 2,500 mg in sodium chloride  0.9 % 250 mL (10 mg/mL) infusion     [START ON 11/30/2024] vancomycin       PRN Medications: acetaminophen , albuterol , gadobutrol , ondansetron  **OR** ondansetron  (ZOFRAN ) IV, mouth rinse, polyethylene glycol    Patient Profile   43 y.o. male w/ h/o heavy alcohol use (at least 5 drinks per evening) and h/o asthma admitted w/ acute systolic heart failure. Strong family history of cardiac disease: mother and maternal grandmother had heart failure and father with CAD.   HFrEF. Severe valvular diease. HF Team consulted.   Assessment/Plan  1. 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 LVEF 35%  - Cath- normal cors, RA5, PA 32/17 (24), PVR 2.57 Papi 3. CO 4.9 CI 3.8 -  Hold further losartan  in anticipation of surgery, off SGLT2 - Hydralazine  25mg  TID for 2 doses - Continue spironolactone  25 mg daily  - Hold off on SGLT2i - no ? blocker with tachycardia, suspect compensatory - Plan for OR Monday  2. Valvular Disease  - severe MR posteriorly directed, mod TR. Likely functional. HF optimization per above - severe AI. 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 Lucas with likely impella support on Monday   3. Transaminates - Improved - Hepatitis panel negative. RUQ US  unremarkable  - suspect 2/2 CHF/hepatic congestion + chronic ETOH use    4. ETOH Abuse - heavy drinker, at least 5 drinks per evening - may be contributing to CM, reduction of intake imperative  - No evidence of withdrawal   5. Hypomagnesemia - follow; goal >2   6. DMII  Hgb A1C 6.1   Length of Stay: 6  Morene JINNY Brownie, MD  11/29/2024, 12:30 PM  Advanced Heart Failure Team Pager [phone] (M-F; 7a - 5p)  Please contact [facility] for night-coverage after hours (5p -7a )  and weekends on [email or web address]

## 2024-11-29 NOTE — Progress Notes (Signed)
 [facility] PROGRESS NOTE  Alejandro Lopez (DOB: 04-19-1981) FMW:[id number] PCP: None  Brief Narrative: Alejandro Lopez is a 43 y.o. male with a history of tobacco use, alcohol abuse, HTN who presented to the ED on 11/22/2024 with swelling in legs extending to scrotum and abdomen as well as dyspnea worse with exertion worsening for > 1 month. Pt was started on IV lasix  with cardiology consultation. Echocardiogram on 12/1 showed LVEF 35-40%, G3DD, severe MR and AI. Patient transferred to [facility] from [facility] for advanced heart failure team management.    Subjective: Patient denies any new complaints.    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 Avoid beta-blockers due to severe AR Continue strict I/O, weights Telemetry  Transaminitis Improving, possibly due to congestive hepatopathy, alcohol abuse Hepatitis panel negative. RUQ U/S negative. Monitor LFTs  periodically  Prediabetes A1c 6.1 Outpatient follow-up  Tobacco use  Advised to quit  Alcohol abuse No withdrawal noted Advised to quit      [name], MD [facility] [email or web address] 11/29/2024, 12:10 PM

## 2024-11-30 ENCOUNTER — Inpatient Hospital Stay (HOSPITAL_COMMUNITY): Admitting: Certified Registered Nurse Anesthetist

## 2024-11-30 ENCOUNTER — Inpatient Hospital Stay (HOSPITAL_COMMUNITY)

## 2024-11-30 ENCOUNTER — Encounter (HOSPITAL_COMMUNITY): Payer: Self-pay | Admitting: Internal Medicine

## 2024-11-30 ENCOUNTER — Telehealth: Payer: Self-pay | Admitting: Acute Care

## 2024-11-30 ENCOUNTER — Ambulatory Visit (HOSPITAL_COMMUNITY)

## 2024-11-30 ENCOUNTER — Inpatient Hospital Stay (HOSPITAL_COMMUNITY)
Admission: EM | Disposition: A | Discharge status: Rehabilitation Facility | Payer: Self-pay | Source: Home / Self Care | Attending: Surgery

## 2024-11-30 DIAGNOSIS — I5021 Acute systolic (congestive) heart failure: Secondary | ICD-10-CM | POA: Diagnosis not present

## 2024-11-30 DIAGNOSIS — I340 Nonrheumatic mitral (valve) insufficiency: Secondary | ICD-10-CM | POA: Diagnosis not present

## 2024-11-30 DIAGNOSIS — I110 Hypertensive heart disease with heart failure: Secondary | ICD-10-CM | POA: Diagnosis not present

## 2024-11-30 DIAGNOSIS — F17210 Nicotine dependence, cigarettes, uncomplicated: Secondary | ICD-10-CM

## 2024-11-30 DIAGNOSIS — R570 Cardiogenic shock: Secondary | ICD-10-CM

## 2024-11-30 DIAGNOSIS — I351 Nonrheumatic aortic (valve) insufficiency: Secondary | ICD-10-CM | POA: Diagnosis present

## 2024-11-30 HISTORY — PX: AORTIC VALVE REPLACEMENT: SHX41

## 2024-11-30 HISTORY — PX: INTRAOPERATIVE TRANSESOPHAGEAL ECHOCARDIOGRAM: SHX5062

## 2024-11-30 HISTORY — PX: PLACEMENT OF IMPELLA LEFT VENTRICULAR ASSIST DEVICE: SHX6519

## 2024-11-30 HISTORY — PX: MITRAL VALVE REPLACEMENT: SHX147

## 2024-11-30 LAB — POCT I-STAT 7, (LYTES, BLD GAS, ICA,H+H)
Acid-Base Excess: 0 mmol/L (ref 0.0–2.0)
Acid-Base Excess: 0 mmol/L (ref 0.0–2.0)
Acid-base deficit: 8 mmol/L — ABNORMAL HIGH (ref 0.0–2.0)
Acid-base deficit: 4 mmol/L — ABNORMAL HIGH (ref 0.0–2.0)
Acid-base deficit: 1 mmol/L (ref 0.0–2.0)
Acid-base deficit: 5 mmol/L — ABNORMAL HIGH (ref 0.0–2.0)
Acid-base deficit: 4 mmol/L — ABNORMAL HIGH (ref 0.0–2.0)
Acid-base deficit: 3 mmol/L — ABNORMAL HIGH (ref 0.0–2.0)
Bicarbonate: 19.3 mmol/L — ABNORMAL LOW (ref 20.0–28.0)
Bicarbonate: 21.8 mmol/L (ref 20.0–28.0)
Bicarbonate: 23.8 mmol/L (ref 20.0–28.0)
Bicarbonate: 25.4 mmol/L (ref 20.0–28.0)
Bicarbonate: 23.8 mmol/L (ref 20.0–28.0)
Bicarbonate: 21.1 mmol/L (ref 20.0–28.0)
Bicarbonate: 22.1 mmol/L (ref 20.0–28.0)
Bicarbonate: 21.5 mmol/L (ref 20.0–28.0)
Calcium, Ion: 1.15 mmol/L (ref 1.15–1.40)
Calcium, Ion: 0.96 mmol/L — ABNORMAL LOW (ref 1.15–1.40)
Calcium, Ion: 1.07 mmol/L — ABNORMAL LOW (ref 1.15–1.40)
Calcium, Ion: 0.96 mmol/L — ABNORMAL LOW (ref 1.15–1.40)
Calcium, Ion: 0.91 mmol/L — ABNORMAL LOW (ref 1.15–1.40)
Calcium, Ion: 0.93 mmol/L — ABNORMAL LOW (ref 1.15–1.40)
Calcium, Ion: 0.95 mmol/L — ABNORMAL LOW (ref 1.15–1.40)
Calcium, Ion: 1.13 mmol/L — ABNORMAL LOW (ref 1.15–1.40)
HCT: 43 % (ref 39.0–52.0)
HCT: 35 % — ABNORMAL LOW (ref 39.0–52.0)
HCT: 42 % (ref 39.0–52.0)
HCT: 37 % — ABNORMAL LOW (ref 39.0–52.0)
HCT: 33 % — ABNORMAL LOW (ref 39.0–52.0)
HCT: 33 % — ABNORMAL LOW (ref 39.0–52.0)
HCT: 32 % — ABNORMAL LOW (ref 39.0–52.0)
HCT: 27 % — ABNORMAL LOW (ref 39.0–52.0)
Hemoglobin: 14.6 g/dL (ref 13.0–17.0)
Hemoglobin: 11.9 g/dL — ABNORMAL LOW (ref 13.0–17.0)
Hemoglobin: 14.3 g/dL (ref 13.0–17.0)
Hemoglobin: 12.6 g/dL — ABNORMAL LOW (ref 13.0–17.0)
Hemoglobin: 11.2 g/dL — ABNORMAL LOW (ref 13.0–17.0)
Hemoglobin: 11.2 g/dL — ABNORMAL LOW (ref 13.0–17.0)
Hemoglobin: 10.9 g/dL — ABNORMAL LOW (ref 13.0–17.0)
Hemoglobin: 9.2 g/dL — ABNORMAL LOW (ref 13.0–17.0)
O2 Saturation: 100 %
O2 Saturation: 100 %
O2 Saturation: 100 %
O2 Saturation: 100 %
O2 Saturation: 100 %
O2 Saturation: 100 %
O2 Saturation: 99 %
O2 Saturation: 100 %
Patient temperature: 36.3
Patient temperature: 36.8
Potassium: 3.4 mmol/L — ABNORMAL LOW (ref 3.5–5.1)
Potassium: 4.3 mmol/L (ref 3.5–5.1)
Potassium: 4.8 mmol/L (ref 3.5–5.1)
Potassium: 4.7 mmol/L (ref 3.5–5.1)
Potassium: 5.3 mmol/L — ABNORMAL HIGH (ref 3.5–5.1)
Potassium: 3.9 mmol/L (ref 3.5–5.1)
Potassium: 4.9 mmol/L (ref 3.5–5.1)
Potassium: 4.6 mmol/L (ref 3.5–5.1)
Sodium: 142 mmol/L (ref 135–145)
Sodium: 137 mmol/L (ref 135–145)
Sodium: 136 mmol/L (ref 135–145)
Sodium: 138 mmol/L (ref 135–145)
Sodium: 137 mmol/L (ref 135–145)
Sodium: 141 mmol/L (ref 135–145)
Sodium: 140 mmol/L (ref 135–145)
Sodium: 140 mmol/L (ref 135–145)
TCO2: 21 mmol/L — ABNORMAL LOW (ref 22–32)
TCO2: 23 mmol/L (ref 22–32)
TCO2: 25 mmol/L (ref 22–32)
TCO2: 27 mmol/L (ref 22–32)
TCO2: 25 mmol/L (ref 22–32)
TCO2: 22 mmol/L (ref 22–32)
TCO2: 23 mmol/L (ref 22–32)
TCO2: 23 mmol/L (ref 22–32)
pCO2 arterial: 44 mmHg (ref 32–48)
pCO2 arterial: 43.9 mmHg (ref 32–48)
pCO2 arterial: 40.9 mmHg (ref 32–48)
pCO2 arterial: 43.4 mmHg (ref 32–48)
pCO2 arterial: 35.5 mmHg (ref 32–48)
pCO2 arterial: 41.9 mmHg (ref 32–48)
pCO2 arterial: 40 mmHg (ref 32–48)
pCO2 arterial: 35.9 mmHg (ref 32–48)
pH, Arterial: 7.249 — ABNORMAL LOW (ref 7.35–7.45)
pH, Arterial: 7.304 — ABNORMAL LOW (ref 7.35–7.45)
pH, Arterial: 7.373 (ref 7.35–7.45)
pH, Arterial: 7.375 (ref 7.35–7.45)
pH, Arterial: 7.434 (ref 7.35–7.45)
pH, Arterial: 7.311 — ABNORMAL LOW (ref 7.35–7.45)
pH, Arterial: 7.347 — ABNORMAL LOW (ref 7.35–7.45)
pH, Arterial: 7.383 (ref 7.35–7.45)
pO2, Arterial: 469 mmHg — ABNORMAL HIGH (ref 83–108)
pO2, Arterial: 314 mmHg — ABNORMAL HIGH (ref 83–108)
pO2, Arterial: 295 mmHg — ABNORMAL HIGH (ref 83–108)
pO2, Arterial: 292 mmHg — ABNORMAL HIGH (ref 83–108)
pO2, Arterial: 236 mmHg — ABNORMAL HIGH (ref 83–108)
pO2, Arterial: 373 mmHg — ABNORMAL HIGH (ref 83–108)
pO2, Arterial: 118 mmHg — ABNORMAL HIGH (ref 83–108)
pO2, Arterial: 208 mmHg — ABNORMAL HIGH (ref 83–108)

## 2024-11-30 LAB — POCT I-STAT, CHEM 8
BUN: 18 mg/dL (ref 6–20)
BUN: 20 mg/dL (ref 6–20)
BUN: 18 mg/dL (ref 6–20)
BUN: 18 mg/dL (ref 6–20)
BUN: 18 mg/dL (ref 6–20)
BUN: 17 mg/dL (ref 6–20)
BUN: 15 mg/dL (ref 6–20)
Calcium, Ion: 1.18 mmol/L (ref 1.15–1.40)
Calcium, Ion: 1.22 mmol/L (ref 1.15–1.40)
Calcium, Ion: 1.05 mmol/L — ABNORMAL LOW (ref 1.15–1.40)
Calcium, Ion: 1.04 mmol/L — ABNORMAL LOW (ref 1.15–1.40)
Calcium, Ion: 0.96 mmol/L — ABNORMAL LOW (ref 1.15–1.40)
Calcium, Ion: 0.94 mmol/L — ABNORMAL LOW (ref 1.15–1.40)
Calcium, Ion: 0.94 mmol/L — ABNORMAL LOW (ref 1.15–1.40)
Chloride: 108 mmol/L (ref 98–111)
Chloride: 108 mmol/L (ref 98–111)
Chloride: 103 mmol/L (ref 98–111)
Chloride: 103 mmol/L (ref 98–111)
Chloride: 105 mmol/L (ref 98–111)
Chloride: 101 mmol/L (ref 98–111)
Chloride: 104 mmol/L (ref 98–111)
Creatinine, Ser: 0.6 mg/dL — ABNORMAL LOW (ref 0.61–1.24)
Creatinine, Ser: 0.8 mg/dL (ref 0.61–1.24)
Creatinine, Ser: 0.8 mg/dL (ref 0.61–1.24)
Creatinine, Ser: 0.8 mg/dL (ref 0.61–1.24)
Creatinine, Ser: 0.8 mg/dL (ref 0.61–1.24)
Creatinine, Ser: 0.7 mg/dL (ref 0.61–1.24)
Creatinine, Ser: 0.7 mg/dL (ref 0.61–1.24)
Glucose, Bld: 96 mg/dL (ref 70–99)
Glucose, Bld: 135 mg/dL — ABNORMAL HIGH (ref 70–99)
Glucose, Bld: 123 mg/dL — ABNORMAL HIGH (ref 70–99)
Glucose, Bld: 121 mg/dL — ABNORMAL HIGH (ref 70–99)
Glucose, Bld: 120 mg/dL — ABNORMAL HIGH (ref 70–99)
Glucose, Bld: 114 mg/dL — ABNORMAL HIGH (ref 70–99)
Glucose, Bld: 133 mg/dL — ABNORMAL HIGH (ref 70–99)
HCT: 43 % (ref 39.0–52.0)
HCT: 48 % (ref 39.0–52.0)
HCT: 39 % (ref 39.0–52.0)
HCT: 40 % (ref 39.0–52.0)
HCT: 34 % — ABNORMAL LOW (ref 39.0–52.0)
HCT: 30 % — ABNORMAL LOW (ref 39.0–52.0)
HCT: 29 % — ABNORMAL LOW (ref 39.0–52.0)
Hemoglobin: 14.6 g/dL (ref 13.0–17.0)
Hemoglobin: 16.3 g/dL (ref 13.0–17.0)
Hemoglobin: 13.3 g/dL (ref 13.0–17.0)
Hemoglobin: 13.6 g/dL (ref 13.0–17.0)
Hemoglobin: 11.6 g/dL — ABNORMAL LOW (ref 13.0–17.0)
Hemoglobin: 10.2 g/dL — ABNORMAL LOW (ref 13.0–17.0)
Hemoglobin: 9.9 g/dL — ABNORMAL LOW (ref 13.0–17.0)
Potassium: 3.5 mmol/L (ref 3.5–5.1)
Potassium: 4.9 mmol/L (ref 3.5–5.1)
Potassium: 4.7 mmol/L (ref 3.5–5.1)
Potassium: 4.9 mmol/L (ref 3.5–5.1)
Potassium: 5.5 mmol/L — ABNORMAL HIGH (ref 3.5–5.1)
Potassium: 4.8 mmol/L (ref 3.5–5.1)
Potassium: 3.9 mmol/L (ref 3.5–5.1)
Sodium: 142 mmol/L (ref 135–145)
Sodium: 137 mmol/L (ref 135–145)
Sodium: 138 mmol/L (ref 135–145)
Sodium: 138 mmol/L (ref 135–145)
Sodium: 138 mmol/L (ref 135–145)
Sodium: 139 mmol/L (ref 135–145)
Sodium: 143 mmol/L (ref 135–145)
TCO2: 23 mmol/L (ref 22–32)
TCO2: 23 mmol/L (ref 22–32)
TCO2: 24 mmol/L (ref 22–32)
TCO2: 23 mmol/L (ref 22–32)
TCO2: 25 mmol/L (ref 22–32)
TCO2: 24 mmol/L (ref 22–32)
TCO2: 24 mmol/L (ref 22–32)

## 2024-11-30 LAB — CBC
HCT: 31.2 % — ABNORMAL LOW (ref 39.0–52.0)
HCT: 26 % — ABNORMAL LOW (ref 39.0–52.0)
HCT: 22.3 % — ABNORMAL LOW (ref 39.0–52.0)
Hemoglobin: 10.6 g/dL — ABNORMAL LOW (ref 13.0–17.0)
Hemoglobin: 8.7 g/dL — ABNORMAL LOW (ref 13.0–17.0)
Hemoglobin: 7.5 g/dL — ABNORMAL LOW (ref 13.0–17.0)
MCH: 32.7 pg (ref 26.0–34.0)
MCH: 32.5 pg (ref 26.0–34.0)
MCH: 32.8 pg (ref 26.0–34.0)
MCHC: 34 g/dL (ref 30.0–36.0)
MCHC: 33.5 g/dL (ref 30.0–36.0)
MCHC: 33.6 g/dL (ref 30.0–36.0)
MCV: 96.3 fL (ref 80.0–100.0)
MCV: 97 fL (ref 80.0–100.0)
MCV: 97.4 fL (ref 80.0–100.0)
Platelets: 82 K/uL — ABNORMAL LOW (ref 150–400)
Platelets: 146 K/uL — ABNORMAL LOW (ref 150–400)
Platelets: 129 K/uL — ABNORMAL LOW (ref 150–400)
RBC: 3.24 MIL/uL — ABNORMAL LOW (ref 4.22–5.81)
RBC: 2.68 MIL/uL — ABNORMAL LOW (ref 4.22–5.81)
RBC: 2.29 MIL/uL — ABNORMAL LOW (ref 4.22–5.81)
RDW: 13.8 % (ref 11.5–15.5)
RDW: 13.9 % (ref 11.5–15.5)
RDW: 14 % (ref 11.5–15.5)
WBC: 11.3 K/uL — ABNORMAL HIGH (ref 4.0–10.5)
WBC: 10.5 K/uL (ref 4.0–10.5)
WBC: 9.1 K/uL (ref 4.0–10.5)
nRBC: 0 % (ref 0.0–0.2)
nRBC: 0 % (ref 0.0–0.2)
nRBC: 0 % (ref 0.0–0.2)

## 2024-11-30 LAB — BLOOD GAS, ARTERIAL
Acid-base deficit: 4.8 mmol/L — ABNORMAL HIGH (ref 0.0–2.0)
Bicarbonate: 20.5 mmol/L (ref 20.0–28.0)
O2 Saturation: 99.2 %
Patient temperature: 37
pCO2 arterial: 38 mmHg (ref 32–48)
pH, Arterial: 7.34 — ABNORMAL LOW (ref 7.35–7.45)
pO2, Arterial: 114 mmHg — ABNORMAL HIGH (ref 83–108)

## 2024-11-30 LAB — POCT I-STAT EG7
Acid-base deficit: 5 mmol/L — ABNORMAL HIGH (ref 0.0–2.0)
Acid-base deficit: 3 mmol/L — ABNORMAL HIGH (ref 0.0–2.0)
Bicarbonate: 22.6 mmol/L (ref 20.0–28.0)
Bicarbonate: 23.3 mmol/L (ref 20.0–28.0)
Calcium, Ion: 1.25 mmol/L (ref 1.15–1.40)
Calcium, Ion: 1.02 mmol/L — ABNORMAL LOW (ref 1.15–1.40)
HCT: 46 % (ref 39.0–52.0)
HCT: 36 % — ABNORMAL LOW (ref 39.0–52.0)
Hemoglobin: 15.6 g/dL (ref 13.0–17.0)
Hemoglobin: 12.2 g/dL — ABNORMAL LOW (ref 13.0–17.0)
O2 Saturation: 78 %
O2 Saturation: 78 %
Potassium: 3.9 mmol/L (ref 3.5–5.1)
Potassium: 4.2 mmol/L (ref 3.5–5.1)
Sodium: 140 mmol/L (ref 135–145)
Sodium: 138 mmol/L (ref 135–145)
TCO2: 24 mmol/L (ref 22–32)
TCO2: 25 mmol/L (ref 22–32)
pCO2, Ven: 52.8 mmHg (ref 44–60)
pCO2, Ven: 47.7 mmHg (ref 44–60)
pH, Ven: 7.24 — ABNORMAL LOW (ref 7.25–7.43)
pH, Ven: 7.297 (ref 7.25–7.43)
pO2, Ven: 50 mmHg — ABNORMAL HIGH (ref 32–45)
pO2, Ven: 48 mmHg — ABNORMAL HIGH (ref 32–45)

## 2024-11-30 LAB — GLOBAL TEG PANEL
CFF Max Amplitude: 16.4 mm (ref 15–32)
CK with Heparinase (R): 9.1 min — ABNORMAL HIGH (ref 4.3–8.3)
Citrated Functional Fibrinogen: 299.3 mg/dL (ref 278–581)
Citrated Kaolin (K): 2.2 min — ABNORMAL HIGH (ref 0.8–2.1)
Citrated Kaolin (MA): 52.1 mm (ref 52–69)
Citrated Kaolin (R): 8.7 min (ref 4.6–9.1)
Citrated Kaolin Angle: 63 deg (ref 63–78)
Citrated Rapid TEG (MA): 55.2 mm (ref 52–70)

## 2024-11-30 LAB — GLUCOSE, CAPILLARY
Glucose-Capillary: 166 mg/dL — ABNORMAL HIGH (ref 70–99)
Glucose-Capillary: 180 mg/dL — ABNORMAL HIGH (ref 70–99)
Glucose-Capillary: 166 mg/dL — ABNORMAL HIGH (ref 70–99)
Glucose-Capillary: 121 mg/dL — ABNORMAL HIGH (ref 70–99)
Glucose-Capillary: 182 mg/dL — ABNORMAL HIGH (ref 70–99)

## 2024-11-30 LAB — PROTIME-INR
INR: 1.6 — ABNORMAL HIGH (ref 0.8–1.2)
INR: 1.3 — ABNORMAL HIGH (ref 0.8–1.2)
Prothrombin Time: 19.7 s — ABNORMAL HIGH (ref 11.4–15.2)
Prothrombin Time: 17.2 s — ABNORMAL HIGH (ref 11.4–15.2)

## 2024-11-30 LAB — HEMOGLOBIN AND HEMATOCRIT, BLOOD
HCT: 34.7 % — ABNORMAL LOW (ref 39.0–52.0)
Hemoglobin: 11.8 g/dL — ABNORMAL LOW (ref 13.0–17.0)

## 2024-11-30 LAB — PLATELET COUNT: Platelets: 107 K/uL — ABNORMAL LOW (ref 150–400)

## 2024-11-30 LAB — FIBRINOGEN
Fibrinogen: 266 mg/dL (ref 210–475)
Fibrinogen: 233 mg/dL (ref 210–475)
Fibrinogen: 245 mg/dL (ref 210–475)

## 2024-11-30 LAB — APTT: aPTT: 39 s — ABNORMAL HIGH (ref 24–36)

## 2024-11-30 LAB — LACTATE DEHYDROGENASE: LDH: 307 U/L — ABNORMAL HIGH (ref 105–235)

## 2024-11-30 LAB — ECHOCARDIOGRAM LIMITED
Height: 70 in
Weight: 2464 [oz_av]

## 2024-11-30 LAB — COOXEMETRY PANEL
Carboxyhemoglobin: 2 % — ABNORMAL HIGH (ref 0.5–1.5)
Methemoglobin: <0.7 % (ref 0.0–1.5)
O2 Saturation: 60.3 %
Total hemoglobin: 9.9 g/dL — ABNORMAL LOW (ref 12.0–16.0)

## 2024-11-30 LAB — PREPARE RBC (CROSSMATCH)

## 2024-11-30 MED ORDER — THROMBIN (RECOMBINANT) 20000 UNITS EX SOLR
CUTANEOUS | Status: AC
  Filled 2024-11-30: qty 20000

## 2024-11-30 MED ORDER — ROCURONIUM BROMIDE 10 MG/ML (PF) SYRINGE
PREFILLED_SYRINGE | INTRAVENOUS | Status: AC
  Filled 2024-11-30: qty 10

## 2024-11-30 MED ORDER — ASPIRIN 81 MG PO CHEW: 324.0000 mg | CHEWABLE_TABLET | Freq: Once | ORAL | Status: DC

## 2024-11-30 MED ORDER — 0.9 % SODIUM CHLORIDE (POUR BTL) OPTIME
TOPICAL | Status: DC | PRN
  Administered 2024-11-30: 6000 mL

## 2024-11-30 MED ORDER — PANTOPRAZOLE SODIUM 40 MG PO TBEC
40.0000 mg | DELAYED_RELEASE_TABLET | Freq: Every day | ORAL | Status: DC
  Administered 2024-12-02 – 2024-12-03 (×2): 40 mg via ORAL
  Filled 2024-11-30 (×2): qty 1

## 2024-11-30 MED ORDER — SODIUM CHLORIDE 0.9% IV SOLUTION: Freq: Once | INTRAVENOUS | Status: AC

## 2024-11-30 MED ORDER — BISACODYL 5 MG PO TBEC
10.0000 mg | DELAYED_RELEASE_TABLET | Freq: Every day | ORAL | Status: DC
  Administered 2024-12-01 – 2024-12-03 (×2): 10 mg via ORAL
  Filled 2024-11-30 (×3): qty 2

## 2024-11-30 MED ORDER — MAGNESIUM SULFATE 4 GM/100ML IV SOLN
4.0000 g | Freq: Once | INTRAVENOUS | Status: AC
  Administered 2024-11-30: 4 g via INTRAVENOUS
  Filled 2024-11-30: qty 100

## 2024-11-30 MED ORDER — LACTATED RINGERS IV SOLN: INTRAVENOUS | Status: DC | PRN

## 2024-11-30 MED ORDER — FENTANYL CITRATE (PF) 250 MCG/5ML IJ SOLN
INTRAMUSCULAR | Status: AC
  Filled 2024-11-30: qty 5

## 2024-11-30 MED ORDER — METOPROLOL TARTRATE 12.5 MG HALF TABLET: 12.5000 mg | ORAL_TABLET | Freq: Two times a day (BID) | ORAL | Status: DC

## 2024-11-30 MED ORDER — ALBUMIN HUMAN 5 % IV SOLN: INTRAVENOUS | Status: DC | PRN

## 2024-11-30 MED ORDER — DEXMEDETOMIDINE HCL IN NACL 80 MCG/20ML IV SOLN
INTRAVENOUS | Status: AC
  Filled 2024-11-30: qty 40

## 2024-11-30 MED ORDER — CALCIUM GLUCONATE-NACL 1-0.675 GM/50ML-% IV SOLN
1.0000 g | Freq: Once | INTRAVENOUS | Status: AC
  Administered 2024-11-30: 1000 mg via INTRAVENOUS
  Filled 2024-11-30: qty 50

## 2024-11-30 MED ORDER — METOPROLOL TARTRATE 5 MG/5ML IV SOLN: 2.5000 mg | INTRAVENOUS | Status: DC | PRN

## 2024-11-30 MED ORDER — SODIUM CHLORIDE 0.9% IV SOLUTION: Freq: Once | INTRAVENOUS | Status: DC

## 2024-11-30 MED ORDER — PROPOFOL 10 MG/ML IV BOLUS
INTRAVENOUS | Status: DC | PRN
  Administered 2024-11-30: 60 mg via INTRAVENOUS

## 2024-11-30 MED ORDER — PROPOFOL 10 MG/ML IV BOLUS
INTRAVENOUS | Status: AC
  Filled 2024-11-30: qty 20

## 2024-11-30 MED ORDER — SODIUM BICARBONATE 8.4 % IV SOLN
INTRAVENOUS | Status: DC
  Filled 2024-11-30 (×6): qty 25

## 2024-11-30 MED ORDER — TRAMADOL HCL 50 MG PO TABS
50.0000 mg | ORAL_TABLET | Freq: Four times a day (QID) | ORAL | Status: DC | PRN
  Administered 2024-12-01 – 2024-12-02 (×5): 50 mg via ORAL
  Filled 2024-11-30 (×6): qty 1

## 2024-11-30 MED ORDER — REVEFENACIN 175 MCG/3ML IN SOLN
175.0000 ug | Freq: Every day | RESPIRATORY_TRACT | Status: DC
  Administered 2024-12-01 – 2024-12-02 (×2): 175 ug via RESPIRATORY_TRACT
  Filled 2024-11-30 (×2): qty 3

## 2024-11-30 MED ORDER — HEPARIN SODIUM (PORCINE) 1000 UNIT/ML IJ SOLN
INTRAMUSCULAR | Status: AC
  Filled 2024-11-30: qty 1

## 2024-11-30 MED ORDER — SODIUM CHLORIDE 0.9 % IV SOLN: INTRAVENOUS | Status: AC

## 2024-11-30 MED ORDER — VANCOMYCIN HCL IN DEXTROSE 1-5 GM/200ML-% IV SOLN
1000.0000 mg | Freq: Once | INTRAVENOUS | Status: AC
  Administered 2024-11-30: 1000 mg via INTRAVENOUS
  Filled 2024-11-30: qty 200

## 2024-11-30 MED ORDER — MORPHINE SULFATE (PF) 2 MG/ML IV SOLN
1.0000 mg | INTRAVENOUS | Status: DC | PRN
  Administered 2024-11-30: 4 mg via INTRAVENOUS
  Administered 2024-12-01: 2 mg via INTRAVENOUS
  Administered 2024-12-01: 4 mg via INTRAVENOUS
  Administered 2024-12-01 – 2024-12-03 (×4): 2 mg via INTRAVENOUS
  Filled 2024-11-30: qty 1
  Filled 2024-11-30 (×2): qty 2
  Filled 2024-11-30: qty 1
  Filled 2024-11-30 (×3): qty 2

## 2024-11-30 MED ORDER — ACETAMINOPHEN 500 MG PO TABS
1000.0000 mg | ORAL_TABLET | Freq: Four times a day (QID) | ORAL | Status: DC
  Administered 2024-12-01 – 2024-12-03 (×9): 1000 mg via ORAL
  Filled 2024-11-30 (×10): qty 2

## 2024-11-30 MED ORDER — METOPROLOL TARTRATE 25 MG/10 ML ORAL SUSPENSION: 12.5000 mg | Freq: Two times a day (BID) | ORAL | Status: DC

## 2024-11-30 MED ORDER — PROTAMINE SULFATE 10 MG/ML IV SOLN
INTRAVENOUS | Status: DC | PRN
  Administered 2024-11-30: 260 mg via INTRAVENOUS

## 2024-11-30 MED ORDER — BISACODYL 10 MG RE SUPP: 10.0000 mg | Freq: Every day | RECTAL | Status: DC

## 2024-11-30 MED ORDER — CALCIUM CHLORIDE 10 % IV SOLN
1.0000 g | Freq: Once | INTRAVENOUS | Status: AC
  Administered 2024-11-30: 1 g via INTRAVENOUS

## 2024-11-30 MED ORDER — OXIDIZED CELLULOSE EX PADS
MEDICATED_PAD | CUTANEOUS | Status: DC | PRN
  Administered 2024-11-30: 1 via TOPICAL

## 2024-11-30 MED ORDER — SODIUM BICARBONATE 8.4 % IV SOLN
INTRAVENOUS | Status: DC
  Filled 2024-11-30: qty 25

## 2024-11-30 MED ORDER — AMIODARONE HCL IN DEXTROSE 360-4.14 MG/200ML-% IV SOLN
60.0000 mg/h | INTRAVENOUS | Status: AC
  Administered 2024-11-30: 60 mg/h via INTRAVENOUS
  Filled 2024-11-30: qty 200

## 2024-11-30 MED ORDER — ACETAMINOPHEN 160 MG/5ML PO SOLN
650.0000 mg | Freq: Once | ORAL | Status: AC
  Administered 2024-11-30: 650 mg
  Filled 2024-11-30: qty 20.3

## 2024-11-30 MED ORDER — NITROGLYCERIN IN D5W 200-5 MCG/ML-% IV SOLN: 0.0000 ug/min | INTRAVENOUS | Status: DC

## 2024-11-30 MED ORDER — SODIUM CHLORIDE 0.45 % IV SOLN: INTRAVENOUS | Status: AC | PRN

## 2024-11-30 MED ORDER — SODIUM CHLORIDE 0.9 % IV SOLN
20.0000 ug | Freq: Once | INTRAVENOUS | Status: AC
  Administered 2024-11-30: 20 ug via INTRAVENOUS
  Filled 2024-11-30 (×3): qty 5

## 2024-11-30 MED ORDER — CHLORHEXIDINE GLUCONATE 0.12 % MT SOLN
15.0000 mL | OROMUCOSAL | Status: AC
  Filled 2024-11-30: qty 15

## 2024-11-30 MED ORDER — LACTATED RINGERS IV SOLN: INTRAVENOUS | Status: AC

## 2024-11-30 MED ORDER — FENTANYL 2500MCG IN NS 250ML (10MCG/ML) PREMIX INFUSION
INTRAVENOUS | Status: AC
  Filled 2024-11-30: qty 250

## 2024-11-30 MED ORDER — INSULIN REGULAR(HUMAN) IN NACL 100-0.9 UT/100ML-% IV SOLN: INTRAVENOUS | Status: DC

## 2024-11-30 MED ORDER — SODIUM CHLORIDE 0.9% IV SOLUTION: INTRAVENOUS | Status: DC | PRN

## 2024-11-30 MED ORDER — SODIUM CHLORIDE 0.9 % IV SOLN: 250.0000 mL | INTRAVENOUS | Status: AC

## 2024-11-30 MED ORDER — CEFAZOLIN SODIUM-DEXTROSE 2-4 GM/100ML-% IV SOLN
2.0000 g | Freq: Three times a day (TID) | INTRAVENOUS | Status: AC
  Administered 2024-11-30 – 2024-12-02 (×6): 2 g via INTRAVENOUS
  Filled 2024-11-30 (×6): qty 100

## 2024-11-30 MED ORDER — POTASSIUM CHLORIDE 10 MEQ/50ML IV SOLN: 10.0000 meq | INTRAVENOUS | Status: AC

## 2024-11-30 MED ORDER — THROMBIN 20000 UNITS EX SOLR: CUTANEOUS | Status: DC | PRN

## 2024-11-30 MED ORDER — ONDANSETRON HCL 4 MG/2ML IJ SOLN
4.0000 mg | Freq: Four times a day (QID) | INTRAMUSCULAR | Status: DC | PRN
  Administered 2024-12-23: 4 mg via INTRAVENOUS
  Filled 2024-11-30: qty 2

## 2024-11-30 MED ORDER — FENTANYL 2500MCG IN NS 250ML (10MCG/ML) PREMIX INFUSION
0.0000 ug/h | INTRAVENOUS | Status: DC
  Administered 2024-11-30: 50 ug/h via INTRAVENOUS

## 2024-11-30 MED ORDER — SODIUM CHLORIDE 0.9 % IV SOLN: INTRAVENOUS | Status: DC | PRN

## 2024-11-30 MED ORDER — FENTANYL BOLUS VIA INFUSION
25.0000 ug | INTRAVENOUS | Status: DC | PRN
  Administered 2024-11-30: 100 ug via INTRAVENOUS
  Administered 2024-12-01: 50 ug via INTRAVENOUS
  Administered 2024-12-01: 100 ug via INTRAVENOUS
  Administered 2024-12-01: 75 ug via INTRAVENOUS

## 2024-11-30 MED ORDER — AMIODARONE HCL IN DEXTROSE 360-4.14 MG/200ML-% IV SOLN
INTRAVENOUS | Status: AC
  Administered 2024-11-30: 60 mg/h via INTRAVENOUS
  Filled 2024-11-30: qty 200

## 2024-11-30 MED ORDER — ROCURONIUM BROMIDE 10 MG/ML (PF) SYRINGE
PREFILLED_SYRINGE | INTRAVENOUS | Status: AC
  Filled 2024-11-30: qty 20

## 2024-11-30 MED ORDER — HEPARIN SODIUM (PORCINE) 1000 UNIT/ML IJ SOLN
INTRAMUSCULAR | Status: DC | PRN
  Administered 2024-11-30: 26000 [IU] via INTRAVENOUS

## 2024-11-30 MED ORDER — NOREPINEPHRINE 4 MG/250ML-% IV SOLN
0.0000 ug/min | INTRAVENOUS | Status: DC
  Administered 2024-11-30 – 2024-12-01 (×2): 10 ug/min via INTRAVENOUS
  Administered 2024-12-01: 2 ug/min via INTRAVENOUS
  Filled 2024-11-30 (×4): qty 250

## 2024-11-30 MED ORDER — OXYCODONE HCL 5 MG PO TABS
5.0000 mg | ORAL_TABLET | ORAL | Status: DC | PRN
  Administered 2024-12-01 – 2024-12-02 (×7): 10 mg via ORAL
  Administered 2024-12-02: 5 mg via ORAL
  Administered 2024-12-03: 10 mg via ORAL
  Administered 2024-12-03: 5 mg via ORAL
  Administered 2024-12-03 (×2): 10 mg via ORAL
  Filled 2024-11-30 (×6): qty 2
  Filled 2024-11-30: qty 1
  Filled 2024-11-30 (×3): qty 2
  Filled 2024-11-30: qty 1
  Filled 2024-11-30: qty 2

## 2024-11-30 MED ORDER — DEXMEDETOMIDINE HCL IN NACL 400 MCG/100ML IV SOLN
0.0000 ug/kg/h | INTRAVENOUS | Status: DC
  Administered 2024-11-30: 0.7 ug/kg/h via INTRAVENOUS
  Administered 2024-11-30 – 2024-12-01 (×3): 1.2 ug/kg/h via INTRAVENOUS
  Filled 2024-11-30 (×3): qty 100

## 2024-11-30 MED ORDER — ASPIRIN 81 MG PO CHEW: 324.0000 mg | CHEWABLE_TABLET | Freq: Every day | ORAL | Status: DC

## 2024-11-30 MED ORDER — FENTANYL CITRATE (PF) 250 MCG/5ML IJ SOLN
INTRAMUSCULAR | Status: DC | PRN
  Administered 2024-11-30: 250 ug via INTRAVENOUS
  Administered 2024-11-30: 50 ug via INTRAVENOUS
  Administered 2024-11-30: 300 ug via INTRAVENOUS
  Administered 2024-11-30: 50 ug via INTRAVENOUS
  Administered 2024-11-30: 150 ug via INTRAVENOUS

## 2024-11-30 MED ORDER — SODIUM CHLORIDE 0.9% IV SOLUTION: INTRAVENOUS | Status: DC

## 2024-11-30 MED ORDER — THROMBIN (RECOMBINANT) 20000 UNITS EX SOLR
CUTANEOUS | Status: DC | PRN
  Administered 2024-11-30: 20000 [IU] via TOPICAL

## 2024-11-30 MED ORDER — PROTAMINE SULFATE 10 MG/ML IV SOLN
INTRAVENOUS | Status: AC
  Filled 2024-11-30: qty 25

## 2024-11-30 MED ORDER — MIDAZOLAM HCL (PF) 10 MG/2ML IJ SOLN
INTRAMUSCULAR | Status: AC
  Filled 2024-11-30: qty 2

## 2024-11-30 MED ORDER — MIDAZOLAM HCL (PF) 2 MG/2ML IJ SOLN
2.0000 mg | INTRAMUSCULAR | Status: DC | PRN
  Administered 2024-11-30 (×3): 2 mg via INTRAVENOUS
  Filled 2024-11-30 (×4): qty 2

## 2024-11-30 MED ORDER — VASOPRESSIN 20 UNIT/ML IV SOLN
INTRAVENOUS | Status: AC
  Filled 2024-11-30: qty 1

## 2024-11-30 MED ORDER — SODIUM CHLORIDE 0.9% FLUSH
3.0000 mL | Freq: Two times a day (BID) | INTRAVENOUS | Status: DC
  Administered 2024-12-01 – 2024-12-28 (×52): 3 mL via INTRAVENOUS

## 2024-11-30 MED ORDER — HEMOSTATIC AGENTS (NO CHARGE) OPTIME
TOPICAL | Status: DC | PRN
  Administered 2024-11-30: 1 via TOPICAL

## 2024-11-30 MED ORDER — ACETAMINOPHEN 160 MG/5ML PO SOLN
1000.0000 mg | Freq: Four times a day (QID) | ORAL | Status: DC
  Administered 2024-12-01: 1000 mg
  Filled 2024-11-30: qty 40.6

## 2024-11-30 MED ORDER — EPINEPHRINE 1 MG/10ML IV SOSY
PREFILLED_SYRINGE | INTRAVENOUS | Status: AC
  Filled 2024-11-30: qty 10

## 2024-11-30 MED ORDER — DEXTROSE 50 % IV SOLN: 0.0000 mL | INTRAVENOUS | Status: DC | PRN

## 2024-11-30 MED ORDER — ALBUMIN HUMAN 5 % IV SOLN
250.0000 mL | INTRAVENOUS | Status: DC | PRN
  Administered 2024-11-30 (×2): 12.5 g via INTRAVENOUS

## 2024-11-30 MED ORDER — THROMBIN 20000 UNITS EX SOLR: OROMUCOSAL | Status: DC | PRN

## 2024-11-30 MED ORDER — MILRINONE LACTATE IN DEXTROSE 20-5 MG/100ML-% IV SOLN
0.2500 ug/kg/min | INTRAVENOUS | Status: DC
  Administered 2024-11-30 – 2024-12-12 (×16): 0.25 ug/kg/min via INTRAVENOUS
  Filled 2024-11-30 (×15): qty 100

## 2024-11-30 MED ORDER — METOCLOPRAMIDE HCL 5 MG/ML IJ SOLN
10.0000 mg | Freq: Four times a day (QID) | INTRAMUSCULAR | Status: AC
  Administered 2024-11-30 – 2024-12-01 (×6): 10 mg via INTRAVENOUS
  Filled 2024-11-30 (×6): qty 2

## 2024-11-30 MED ORDER — DOCUSATE SODIUM 100 MG PO CAPS
200.0000 mg | ORAL_CAPSULE | Freq: Every day | ORAL | Status: DC
  Administered 2024-12-01 – 2024-12-03 (×3): 200 mg via ORAL
  Filled 2024-11-30 (×3): qty 2

## 2024-11-30 MED ORDER — MIDAZOLAM HCL (PF) 5 MG/ML IJ SOLN
INTRAMUSCULAR | Status: DC | PRN
  Administered 2024-11-30 (×3): 2 mg via INTRAVENOUS
  Administered 2024-11-30: 1 mg via INTRAVENOUS
  Administered 2024-11-30: 3 mg via INTRAVENOUS

## 2024-11-30 MED ORDER — ARFORMOTEROL TARTRATE 15 MCG/2ML IN NEBU
15.0000 ug | INHALATION_SOLUTION | Freq: Two times a day (BID) | RESPIRATORY_TRACT | Status: DC
  Administered 2024-11-30 – 2024-12-02 (×5): 15 ug via RESPIRATORY_TRACT
  Filled 2024-11-30 (×5): qty 2

## 2024-11-30 MED ORDER — PANTOPRAZOLE SODIUM 40 MG IV SOLR
40.0000 mg | Freq: Every day | INTRAVENOUS | Status: AC
  Administered 2024-11-30 – 2024-12-01 (×2): 40 mg via INTRAVENOUS
  Filled 2024-11-30 (×2): qty 10

## 2024-11-30 MED ORDER — SODIUM CHLORIDE 0.9% FLUSH: 3.0000 mL | INTRAVENOUS | Status: DC | PRN

## 2024-11-30 MED ORDER — ROCURONIUM BROMIDE 10 MG/ML (PF) SYRINGE
PREFILLED_SYRINGE | INTRAVENOUS | Status: DC | PRN
  Administered 2024-11-30: 70 mg via INTRAVENOUS
  Administered 2024-11-30: 50 mg via INTRAVENOUS
  Administered 2024-11-30 (×2): 30 mg via INTRAVENOUS
  Administered 2024-11-30: 20 mg via INTRAVENOUS

## 2024-11-30 MED ORDER — FENTANYL 2500MCG IN NS 250ML (10MCG/ML) PREMIX INFUSION: 0.0000 ug/h | INTRAVENOUS | Status: DC

## 2024-11-30 MED ORDER — AMIODARONE HCL IN DEXTROSE 360-4.14 MG/200ML-% IV SOLN
30.0000 mg/h | INTRAVENOUS | Status: DC
  Administered 2024-12-01 – 2024-12-19 (×35): 30 mg/h via INTRAVENOUS
  Filled 2024-11-30 (×37): qty 200

## 2024-11-30 MED ORDER — EPINEPHRINE HCL 5 MG/250ML IV SOLN IN NS
0.0000 ug/min | INTRAVENOUS | Status: DC
  Administered 2024-12-01: 4 ug/min via INTRAVENOUS
  Administered 2024-12-02: 3 ug/min via INTRAVENOUS
  Filled 2024-11-30 (×2): qty 250

## 2024-11-30 MED ORDER — ASPIRIN 325 MG PO TBEC: 325.0000 mg | DELAYED_RELEASE_TABLET | Freq: Every day | ORAL | Status: DC

## 2024-11-30 MED ORDER — VASOPRESSIN 20 UNITS/100 ML INFUSION FOR SHOCK
0.0000 [IU]/min | INTRAVENOUS | Status: DC
  Administered 2024-12-01 (×2): 0.03 [IU]/min via INTRAVENOUS
  Filled 2024-11-30 (×2): qty 100

## 2024-11-30 NOTE — Interval H&P Note (Signed)
 History and Physical Interval Note:  11/30/2024 6:48 AM  Alejandro Lopez  has presented today for surgery, with the diagnosis of SEVERE MR SEVERE AI CHF.  The various methods of treatment have been discussed with the patient and family. After consideration of risks, benefits and other options for treatment, the patient has consented to  Procedure(s) with comments: REPLACEMENT, AORTIC VALVE, OPEN (N/A) REPLACEMENT, MITRAL VALVE (N/A) INSERTION, CARDIAC ASSIST DEVICE, IMPELLA (N/A) - IMPELLA 5.5 INSERTION ECHOCARDIOGRAM, TRANSESOPHAGEAL, INTRAOPERATIVE (N/A) as a surgical intervention.  The patient's history has been reviewed, patient examined, no change in status, stable for surgery.  I have reviewed the patient's chart and labs.  Questions were answered to the patient's satisfaction.     Hutton Pellicane K Angelisse Riso

## 2024-11-30 NOTE — Progress Notes (Signed)
  Echocardiogram Echocardiogram Transesophageal has been performed.  Devora Ellouise SAUNDERS 11/30/2024, 8:39 AM

## 2024-11-30 NOTE — Anesthesia Procedure Notes (Signed)
 Central Venous Catheter Insertion Performed by: Dorethea Cordella SQUIBB, DO, anesthesiologist Start/End12/07/2024 7:16 AM, 11/30/2024 7:22 AM Patient location: Pre-op. Preanesthetic checklist: patient identified, IV checked, site marked, risks and benefits discussed, surgical consent, monitors and equipment checked, pre-op evaluation, timeout performed and anesthesia consent Hand hygiene performed  and maximum sterile barriers used  PA cath was placed.Swan type:thermodilution PA Cath depth:50 Attempts: 1 Patient tolerated the procedure well with no immediate complications.

## 2024-11-30 NOTE — Transfer of Care (Signed)
 Immediate Anesthesia Transfer of Care Note  Patient: Alejandro Lopez  Procedure(s) Performed: REPLACEMENT, AORTIC VALVE, OPEN WITH INSPIRIS RESILIA AORTIC VALVE 23mm (Chest) REPLACEMENT, MITRAL VALVE WITH MITRIS RESILIA MITRAL VALVE 27mm (Chest) INSERTION, CARDIAC ASSIST DEVICE, IMPELLA ECHOCARDIOGRAM, TRANSESOPHAGEAL, INTRAOPERATIVE  Patient Location: SICU  Anesthesia Type:General  Level of Consciousness: Patient remains intubated per anesthesia plan  Airway & Oxygen  Therapy: Patient remains intubated per anesthesia plan and Patient placed on Ventilator (see vital sign flow sheet for setting)  Post-op Assessment: Report given to RN and Post -op Vital signs reviewed and stable  Post vital signs: Reviewed and stable  Last Vitals:  Vitals Value Taken Time  BP 90/73 11/30/24 1420  Temp    Pulse 101 11/30/24 1420  Resp 14 11/30/24 1420  SpO2 100 11/30/24 1420    Last Pain:  Vitals:   11/30/24 0646  TempSrc: Oral  PainSc:       Patients Stated Pain Goal: 0 (11/26/24 0027)  Complications: No notable events documented.

## 2024-11-30 NOTE — Brief Op Note (Signed)
 11/22/2024 - 11/30/2024  1:10 PM  PATIENT:  Alejandro Lopez  43 y.o. male  PRE-OPERATIVE DIAGNOSIS:  1. SEVERE MITRAL REGURGITATION 2. SEVERE AORTIC INSUFFICIENCY 3. CHRONIC HEART FAILURE  POST-OPERATIVE DIAGNOSIS:  1. SEVERE MITRAL REGURGITATION 2. SEVERE AORTIC INSUFFICIENCY 3. CHRONIC HEART FAILURE  PROCEDURE: INTRAOPERATIVE TRANSESOPHAGEAL ECHOCARDIOGRAM,  AORTIC VALVE REPLACEMENT (USING an INSPIRIS RESILIA AORTIC VALVE, Model #11500A, Serial # [id number], Size 23 mm),   MITRAL VALVE REPLACEMEN (USING a MITRIS RESILIA MITRAL VALVE Model # 11400M, Serial # [id number], SIZE 27 mm), IMPELLA 5.5 INSERTION  SURGEON:  Surgeons and Role:    Lucas Dorise POUR, MD - Primary  PHYSICIAN ASSISTANT: Kyla Donald PA-C  ASSISTANTS: Luke Stacks RNFA   ANESTHESIA:   general  EBL:  Per anesthesia, perfusion record  BLOOD ADMINISTERED:1 Cryo and 20 mcg DDAVP  (as of 1315)  DRAINS: Chest tubes placed in the mediastinal and pleural spaces   SPECIMEN:  Source of Specimen:  Native aortic valve leaflets  DISPOSITION OF SPECIMEN:  PATHOLOGY  COUNTS CORRECT:  YES  DICTATION: .Dragon Dictation  PLAN OF CARE: Admit to inpatient   PATIENT DISPOSITION:  ICU - intubated and hemodynamically stable.   Delay start of Pharmacological VTE agent (>24hrs) due to surgical blood loss or risk of bleeding: no  BASELINE WEIGHT: 67.7  kg

## 2024-11-30 NOTE — Anesthesia Procedure Notes (Signed)
 Central Venous Catheter Insertion Performed by: Dorethea Cordella SQUIBB, DO, anesthesiologist Start/End12/07/2024 6:55 AM, 11/30/2024 7:10 AM Patient location: Pre-op. Preanesthetic checklist: patient identified, IV checked, site marked, risks and benefits discussed, surgical consent, monitors and equipment checked, pre-op evaluation, timeout performed and anesthesia consent Position: Trendelenburg Lidocaine  1% used for infiltration and patient sedated Hand hygiene performed  and maximum sterile barriers used  Catheter size: 9 Fr Central line was placed.MAC introducer Procedure performed using ultrasound to evaluate access site. Ultrasound Notes:relevant anatomy identified, ultrasound used to visualize needle entry, vessel patent under ultrasound and image(s) printed for medical record. Attempts: 1 Following insertion, line sutured and dressing applied. Post procedure assessment: blood return through all ports, free fluid flow and no air  Patient tolerated the procedure well with no immediate complications.

## 2024-11-30 NOTE — Plan of Care (Signed)
   Problem: Education: Goal: Knowledge of General Education information will improve Description: Including pain rating scale, medication(s)/side effects and non-pharmacologic comfort measures Outcome: Progressing   Problem: Activity: Goal: Risk for activity intolerance will decrease Outcome: Progressing   Problem: Nutrition: Goal: Adequate nutrition will be maintained Outcome: Progressing

## 2024-11-30 NOTE — Anesthesia Procedure Notes (Signed)
 Procedure Name: Intubation Date/Time: 11/30/2024 8:06 AM  Performed by: Boyce Shilling, CRNAPre-anesthesia Checklist: Patient identified, Emergency Drugs available, Suction available, Timeout performed and Patient being monitored Patient Re-evaluated:Patient Re-evaluated prior to induction Oxygen  Delivery Method: Circle system utilized Preoxygenation: Pre-oxygenation with 100% oxygen  Induction Type: IV induction Ventilation: Mask ventilation without difficulty Laryngoscope Size: 4 and Glidescope Grade View: Grade I Tube type: Oral Tube size: 8.0 mm Number of attempts: 2 Airway Equipment and Method: Stylet, Video-laryngoscopy and Rigid stylet Placement Confirmation: ETT inserted through vocal cords under direct vision, positive ETCO2, CO2 detector and breath sounds checked- equal and bilateral Secured at: 24 cm Tube secured with: Tape Dental Injury: Teeth and Oropharynx as per pre-operative assessment  Difficulty Due To: Difficult Airway- due to anterior larynx Comments: DL x 1 with MAC 4 blade and grade III view. DL x 2 with Glidescope blade 4 with grade I view and smooth intubation.

## 2024-11-30 NOTE — Progress Notes (Signed)
 eLink Physician-Brief Progress Note Patient Name: Alejandro Lopez DOB: 1981-09-19 MRN: [id number]   Date of Service  11/30/2024  HPI/Events of Note  Notified as pt suddenly woke up, and went into Vtach/Vfib.  By the time I can camera'd in, pt was back in sinus rhythm, MAP >65.   Noted ~62ml output per mediastinal chest tube.   eICU Interventions  Pt already on  amiodarone  drip, receiving magnesium , which should address arrhythmia. Will follow serial BMP, address abnormalities as warranted.  Defibrillator pads placed should pt again go into a ventricualr arrhythmia.    Pt also receiving tranexamic acid , due to receive calcium  and cryoppt as well. Will continue to monitor output per mediastinal tube.   Added fentanyl  drip for sedation, pain control.        Domenica Weightman M DELA CRUZ 11/30/2024, 8:17 PM  12:40 AM Notified of Hgb 7.5 Goal Hgb >8.5 Placed order to transfuse 1 unit PRBC.

## 2024-11-30 NOTE — Progress Notes (Signed)
   [address], [place] [address]             [id number]   S/p AVR, MVR, Impella  Back from OR about 45 minutes ago  Intubated, sedated  BP 96/67   Pulse (!) 104   Temp 97.9 F (36.6 C) (Oral)   Resp (!) 23   Ht 5' 10 (1.778 m)   Wt 69.9 kg   SpO2 100%   BMI 22.10 kg/m   38/24 CI 2.1 Epi 5, milrinone  0.25, norepi 3 Impella p7, 4L/ min  Intake/Output Summary (Last 24 hours) at 11/30/2024 1658 Last data filed at 11/30/2024 1536 Gross per 24 hour  Intake 4468 ml  Output 5235 ml  Net -767 ml   CT ~ 150 ml since arrival- received multiple products in OR, PLTs ordered but not given yet Postop labs pending  Elspeth C. Kerrin, MD [facility] [phone]

## 2024-11-30 NOTE — Progress Notes (Signed)
 [facility] PROGRESS NOTE  Alejandro Lopez (DOB: 11/07/1981) FMW:[id number] PCP: None  Brief Narrative: Alejandro Lopez is a 43 y.o. male with a history of tobacco use, alcohol abuse, HTN who presented to the ED on 11/22/2024 with swelling in legs extending to scrotum and abdomen as well as dyspnea worse with exertion worsening for > 1 month. Pt was started on IV lasix  with cardiology consultation. Echocardiogram on 12/1 showed LVEF 35-40%, G3DD, severe MR and AI. Patient transferred to [facility] from [facility] for advanced heart failure team management.    Subjective: Patient left the floor very early this morning for valve replacement surgery.  I did not get a chance to see him.    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 Avoid beta-blockers due to severe AR Continue strict I/O, weights Telemetry  Transaminitis Improving, possibly due to congestive hepatopathy, alcohol abuse Hepatitis panel negative. RUQ U/S negative. Monitor LFTs periodically  Prediabetes A1c 6.1 Outpatient follow-up  Tobacco use  Advised to quit  Alcohol abuse No withdrawal noted Advised to quit      Alejandro JINNY Cage,  MD [facility] [email or web address] 11/30/2024, 9:25 AM

## 2024-11-30 NOTE — Op Note (Signed)
 CARDIOVASCULAR SURGERY OPERATIVE NOTE  11/30/2024 HUDSON MAJKOWSKI [id number]  Surgeon:  Dorise LOIS Fellers, MD  First Assistant: Kyla Donald, PA-C: An experienced assistant was required given the complexity of this surgery and the standard of surgical care. The assistant was needed for exposure, dissection, suctioning, retraction of delicate tissues and sutures, instrument exchange and for overall help during this procedure.    Preoperative Diagnosis:  Severe aortic insufficiency, Severe mitral regurgitation, Severe left ventricular systolic function.   Postoperative Diagnosis:  Same   Procedure:  Median Sternotomy Extracorporeal circulation 3.   Aortic valve replacement using a 23 mm Edwards INSPIRIS RESILIA pericardial valve. 4.   Chordal sparing Mitral valve replacement using a 27 mm Edwards MITRIS  pericardial valve 5.   Insertion of Impella 5.5 via right axillary artery.  Anesthesia:  General Endotracheal   Clinical History/Surgical Indication:  This 43 year old gentleman has severe aortic insufficiency and severe mitral regurgitation with an ejection fraction of around 35% presenting with acute systolic congestive heart failure.  He has normal coronary anatomy.  I suspect that his primary abnormality was longstanding aortic insufficiency leading to left ventricular dilation and dysfunction with secondary mitral regurgitation due to leaflet tethering and restriction.  His left ventricular dysfunction could also be complicated by his history of heavy alcohol use.  He has improved quickly with IV diuresis.  I think the options for treating him are either aortic and mitral valve replacement with bioprosthetic valves with planned Impella support or proceeding with LVAD workup.  Even with LVAD surgery he would require aortic valve replacement and probably mitral valve replacement given the degree of regurgitation.  I do not think he is a transplant candidate at this time given his  ongoing smoking and alcohol use.  I think the best option is to proceed with aortic and mitral valve replacements with planned LV Impella support with the hope that his left ventricle will recover with Impella followed by GDMT.  It is still possible he may require a durable LVAD or transplant if his left ventricular function does not recover.  I discussed all of this with him and his wife and answered all their questions. I discussed the operative procedure with the patient and his wife including alternatives, benefits and risks; including but not limited to bleeding, blood transfusion, infection, stroke, myocardial infarction, heart block requiring a permanent pacemaker, organ dysfunction, and death.  Achillies Buehl Eoff understands and agrees to proceed.   Preparation:  The patient was seen in the preoperative holding area and the correct patient, correct operation were confirmed with the patient after reviewing the medical record and catheterization. The consent was signed by me. Preoperative antibiotics were given. A pulmonary arterial line and radial arterial line were placed by the anesthesia team. The patient was taken back to the operating room and positioned supine on the operating room table. After being placed under general endotracheal anesthesia by the anesthesia team a foley catheter was placed. The neck, chest, abdomen, and both legs were prepped with betadine soap and solution and draped in the usual sterile manner. A surgical time-out was taken and the correct patient and operative procedure were confirmed with the nursing and anesthesia staff.   Pre-bypass TEE:   Complete TEE assessment was performed by Dr. Dorethea. This showed a trileaflet aortic valve with severe AI, severe MR due to leaflet restriction and non-coaptation from severe LV dilation to 7.5 cm. LVEF 30%. Mild to moderate RV systolic dysfunction. Trivial TR.    Post-bypass TEE:  Normal functioning prosthetic aortic and  mitral valves with no perivalvular leak or regurgitation through the valves.   Right axillary artery exposure and cannulation:  A transverse incision was made below the right clavicle. The pectoralis major muscle was split along its fibers and the pectoralis minor muscle was retracted laterally. The brachial plexus was identified and gently retracted laterally to expose the axillary artery. The artery was controlled proximally and distally with vessel loops. The patient was fully heparinized and ACT maintained greater than 400. The axillary artery was clamped proximally and distally with peripheral Debakey clamps. It was opened longitudinally.  A 10 mm Hemashield dacron graft was anastomosed in an end to side manner using continuous 5-0 prolene suture. [place] was applied for hemostasis and the clamps removed. The graft was clamped and placed in the pocket for protection until Impella insertion later.     Cardiopulmonary Bypass:  A median sternotomy was performed. The pericardium was opened in the midline. Right ventricular function appeared moderately decreased. PA pressures were half of systemic. The ascending aorta was of normal size and had no palpable plaque. There were no contraindications to aortic cannulation or cross-clamping.  The proximal aortic arch was cannulated with a 20 F aortic cannula for arterial inflow. Bi-caval venous cannulation was performed via the SVC using a 24 F metal tip right angle cannula and the IVC using a 34 Fr malleable plastic venous cannula. An antegrade cardioplegia/vent cannula was inserted into the mid-ascending aorta.  A retrograde cardioplegia cannnula was placed into the coronary sinus via the right atrium. Aortic occlusion was performed with a single cross-clamp. Systemic cooling to 28 degrees Centigrade and topical cooling of the heart with iced saline were used. Cold retrograde KBC cardioplegia was used to induce diastolic arrest and then was given at about 60  minute intervals throughout the period of arrest. A temperature probe was inserted into the interventricular septum and an insulating pad was placed in the pericardium. Carbon dioxide was insufflated into the pericardium at 5L/min throughout the procedure to minimize intracardiac air.  Mitral Valve Replacement:   The left atrium was opened through a vertical incision in the interatrial groove. Exposure was good. Valve inspection showed normal leaflets with complete lack of coaptation. The anterior leaflet was split down the middle and tacked to the annulus at 10 and 2 o'clock using valve sutures. The posterior leaflet remained in place as is.  A series of pledgetted 2-0 Ethibond sutures were placed around the mitral annulus. A 27 mm Edwards MITRIS valve was chosen ( model [id number], SN [id number]. The sutures were placed through the sewing ring and it was lowered into place. The sutures were tied using CorKnots. The valve was tested with saline and there was no regurgitation. The atrium was closed with 2 layers of continuous 3-0 prolene suture.    Aortic Valve Replacement:   A transverse aortotomy was performed 1 cm above the take-off of the right coronary artery. The native valve was tricuspid with thickened leaflets and fenestrated commissures with some ruptured fenestrations. The ostia of the coronary arteries were in normal position and were not obstructed. The native valve leaflets were excised.  Care was taken to remove all particulate debris. The left ventricle was directly inspected for debris and then irrigated with ice saline solution. The annulus was sized and a size 23 mm Edwards INSPIRIS RESILIA pericardial valve was chosen. The model number was [id number] and the serial number was [id number]. While the valve was being prepared 2-0 Ethibond  pledgeted horizontal mattress sutures were placed around the annulus with the pledgets in a sub-annular position. The sutures were placed through the sewing ring and  the valve lowered into place. The sutures were tied using CorKnots. The valve seated nicely and the coronary ostia were not obstructed. The prosthetic valve leaflets moved normally and there was no sub-valvular obstruction. The aortotomy was closed using 4-0 Prolene suture in 2 layers with felt strips to reinforce the closure.  The patient was rewarmed to 37 degrees Centigrade. De-airing maneuvers were performed and the head placed in trendelenburg position. The crossclamp was removed with a time of 170 minutes. There was spontaneous return of sinus rhythm. The aortotomy was checked for hemostasis. Two temporary epicardial pacing wires were placed on the right atrium and two on the right ventricle.   Insertion of Impella 5.5:  The axillary artery graft was brought through a subcutaneous tunnel inferior to the incision. The peel away sheath was inserted into the end of the graft and fastened with the sleeve connectors to provide hemostasis. A .035 J-wire was advanced through the sheath into the ascending aorta under fluoroscopic guidance usin C-arm. A pig tail catheter was inserted over the wire and then advanced across the aortic valve without difficulty to the LV apex. The J-wire was changed out for 0.18 wire and it was positioned with a gentle curve in the LV apex. The pigtail was removed. Then the graft was clamped adjacent to the anastomosis and the 5.5 Impella inserted through the sheath. Before full insertion it was burped by removing the clamp on the graft temporarily. The Impella was advanced fully into the graft and the clamp removed. The Impella was advanced into the LV and the wire removed. Position was confirmed with TEE with the tip 5.5 cm below the aortic valve. The graft was again clamped and the graft was cut with a 1 cm cuff outside the skin.  The sheath was inserted into the end of the graft and it was fastened with 1-0 silk ties for hemostasis. The sheath was attached to the skin with 0-silk  sutures.  The wound was hemostatic. A 15 Fr Blake drain was brought through a stab incision to the submuscular plane and positioned deep in the wound. The pectoralis muscle was approximated with continuous 2-0 Vicryl suture. The subcutaneous tissue was approximated with continuous 3-0 Vicryl suture. The skin was approximated with continuous 3-0 Vicryl subcuticular suture.   Completion:  The Impella was started at P2. The patient was weaned from CPB without difficulty on milrinone  0.25 and epi as the Impella was gradually increased to P7. CPB time was 241 minutes. Cardiac output was 5 LPM. TEE showed good RV function and the valves were functioning normally.  Heparin  was fully reversed with protamine  and the aortic and venous cannulas removed. Hemostasis was achieved. Mediastinal drainage tubes were placed. The sternum was closed with  #6 stainless steel wires. The fascia was closed with continuous # 1 vicryl suture. The subcutaneous tissue was closed with 2-0 vicryl continuous suture. The skin was closed with 3-0 vicryl subcuticular suture. All sponge, needle, and instrument counts were reported correct at the end of the case. Dry sterile dressings were placed over the incisions and around the chest tubes which were connected to pleurevac suction. The patient was then transported to the surgical intensive care unit in critical but stable condition.

## 2024-11-30 NOTE — Progress Notes (Signed)
 Advanced Heart Failure Rounding Note  Cardiologist: None  Chief Complaint: Valvular Heart Disease & HFrEF Subjective:   S/P AVR/MVR with Impella 5.5 placed.   Impella 5.5 - P7 Flow 3.8  Milrinone  0.25, Norepi 3 mcg, Epi 5 mcg.   Sedated/intubated.  Objective:   Weight Range: 69.9 kg Body mass index is 22.1 kg/m.   Vital Signs:   Temp:  [97.5 F (36.4 C)-98.9 F (37.2 C)] 97.9 F (36.6 C) (12/08 0646) Pulse Rate:  [87-122] 104 (12/08 1620) Resp:  [17-24] 23 (12/08 1620) BP: (96-136)/(67-95) 96/67 (12/08 1620) SpO2:  [94 %-100 %] 100 % (12/08 1644) FiO2 (%):  [50 %] 50 % (12/08 1620) Weight:  [69.9 kg-70.1 kg] 69.9 kg (12/08 0646) Last BM Date : 11/29/24  Weight change: Filed Weights   11/29/24 0914 11/30/24 0516 11/30/24 0646  Weight: 69.5 kg 70.1 kg 69.9 kg    Intake/Output:   Intake/Output Summary (Last 24 hours) at 11/30/2024 1650 Last data filed at 11/30/2024 1536 Gross per 24 hour  Intake 4468 ml  Output 5235 ml  Net -767 ml      Physical Exam   General:  Intubated/sedated. Cor: Regular rate & rhythm. R axillary 5.5  JP bloody exudate. Lungs: clear MT Extremities: no  edema Neuro: Intubated  GU: foley  Telemetry  ST 100s   EKG     Labs    CBC Recent Labs    11/28/24 1107 11/29/24 0345 11/30/24 0811 11/30/24 1226 11/30/24 1231 11/30/24 1432 11/30/24 1441  WBC 8.1 8.3  --   --   --   --   --   HGB 16.2 16.3   < > 11.8*   < > 9.9* 11.2*  HCT 47.2 47.1   < > 34.7*   < > 29.0* 33.0*  MCV 94.4 95.9  --   --   --   --   --   PLT 209 211  --  107*  --   --   --    < > = values in this interval not displayed.   Basic Metabolic Panel Recent Labs    87/92/74 0345 11/29/24 1629 11/30/24 0811 11/30/24 1336 11/30/24 1432 11/30/24 1441  NA 140 136   < > 139 143 141  K 4.1 4.8   < > 4.8 3.9 3.9  CL 102 107   < > 101 104  --   CO2 26 18*  --   --   --   --   GLUCOSE 110* 102*   < > 114* 133*  --   BUN 19 24*   < > 17 15  --    CREATININE 1.10 1.27*   < > 0.70 0.70  --   CALCIUM  9.1 8.8*  --   --   --   --    < > = values in this interval not displayed.   Liver Function Tests Recent Labs    11/29/24 1629  AST 63*  ALT 66*  ALKPHOS 127*  BILITOT 0.9  PROT 6.7  ALBUMIN  3.1*   No results for input(s): LIPASE, AMYLASE in the last 72 hours. Cardiac Enzymes No results for input(s): CKTOTAL, CKMB, CKMBINDEX, TROPONINI in the last 72 hours.  BNP: BNP (last 3 results) No results for input(s): BNP in the last 8760 hours.  ProBNP (last 3 results) Recent Labs    11/22/24 1958  PROBNP 3,354.0*     D-Dimer No results for input(s): DDIMER in the last 72  hours. Hemoglobin A1C No results for input(s): HGBA1C in the last 72 hours. Fasting Lipid Panel No results for input(s): CHOL, HDL, LDLCALC, TRIG, CHOLHDL, LDLDIRECT in the last 72 hours. Thyroid Function Tests No results for input(s): TSH, T4TOTAL, T3FREE, THYROIDAB in the last 72 hours.  Invalid input(s): FREET3  Other results:   Imaging    DG C-Arm 1-60 Min-No Report Result Date: 11/30/2024 Fluoroscopy was utilized by the requesting physician.  No radiographic interpretation.     Medications:     Scheduled Medications:  sodium chloride    Intravenous Once   sodium chloride    Intravenous Once   [START ON 12/01/2024] acetaminophen   1,000 mg Oral Q6H   Or   [START ON 12/01/2024] acetaminophen  (TYLENOL ) oral liquid 160 mg/5 mL  1,000 mg Per Tube Q6H   acetaminophen  (TYLENOL ) oral liquid 160 mg/5 mL  650 mg Per Tube Once   aspirin   324 mg Oral Once   [START ON 12/01/2024] aspirin  EC  325 mg Oral Daily   Or   [START ON 12/01/2024] aspirin   324 mg Per Tube Daily   [START ON 12/01/2024] bisacodyl   10 mg Oral Daily   Or   [START ON 12/01/2024] bisacodyl   10 mg Rectal Daily   calcium  chloride  1 g Intravenous Once   chlorhexidine   15 mL Mouth/Throat NOW   [START ON 12/01/2024] docusate sodium   200 mg Oral  Daily   folic acid   1 mg Oral Daily   metoCLOPramide  (REGLAN ) injection  10 mg Intravenous Q6H   metoprolol  tartrate  12.5 mg Oral BID   Or   metoprolol  tartrate  12.5 mg Per Tube BID   multivitamin with minerals  1 tablet Oral Daily   mupirocin  ointment  1 Application Nasal BID   [START ON 12/02/2024] pantoprazole   40 mg Oral Daily   pantoprazole  (PROTONIX ) IV  40 mg Intravenous QHS   [START ON 12/01/2024] sodium chloride  flush  3 mL Intravenous Q12H   thiamine   100 mg Oral Daily    Infusions:  sodium chloride      sodium chloride      [START ON 12/01/2024] sodium chloride      sodium chloride      sodium chloride      sodium chloride      albumin  human     calcium  gluconate      ceFAZolin  (ANCEF ) IV     dexmedetomidine  (PRECEDEX ) IV infusion     epinephrine      insulin  1 Units/hr (11/30/24 1615)   lactated ringers      lactated ringers      magnesium  sulfate     milrinone      nitroGLYCERIN      norepinephrine  (LEVOPHED ) Adult infusion     potassium chloride      sodium bicarbonate  25 mEq (Impella PURGE) in dextrose  5 % 1000 mL bag     vancomycin      vasopressin       PRN Medications: sodium chloride , sodium chloride , sodium chloride , albumin  human, albuterol , dextrose , metoprolol  tartrate, midazolam  PF, morphine  injection, ondansetron  (ZOFRAN ) IV, oxyCODONE , [START ON 12/01/2024] sodium chloride  flush, traMADol     Patient Profile  43 y.o. male w/ h/o heavy alcohol use (at least 5 drinks per evening) and h/o asthma admitted w/ acute systolic heart failure. Strong family history of cardiac disease: mother and maternal grandmother had heart failure and father with CAD.    HFrEF. Severe valvular diease.    Assessment/Plan  1.  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  Valvular Disease  - severe MR  posteriorly directed, mod TR. Likely functional. HF optimization per above - severe AI.-->CMR - Severe AR/MR  -S/P AVR/MVR with Impella 5.5 - Impella repositioned at bedside per  Dr  Zenaida.  -Currently on Epi 5 + Norpei 3 mcg+ Milrinone  0.25 mcg.  -Wean pressors as able.  Check CO-OX  2. 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 LVEF 35%  - Cath- normal cors, RA5, PA 32/17 (24), PVR 2.57 Papi 3. CO 4.9 CI 3.8 -  Restart GDMT once off pressors.   3. Transaminates - Hepatitis panel negative. RUQ US  unremarkable  - suspect 2/2 CHF/hepatic congestion + chronic ETOH use    4. ETOH Abuse - heavy drinker, at least 5 drinks per evening - may be contributing to CM, reduction of intake imperative  - No evidence of withdrawal   5. Hypomagnesemia - follow; goal >2    6. DMII  Hgb A1C 6.1 On SSI.   Length of Stay: 7  Charleston Hankin, NP  11/30/2024, 4:50 PM  Advanced Heart Failure Team Pager [phone] (M-F; 7a - 5p)  Please contact [facility] for night-coverage after hours (5p -7a ) and weekends on [email or web address]

## 2024-11-30 NOTE — Discharge Summary (Signed)
 "      [address] [place] [address]             [phone]        Physician Discharge Summary  Patient ID: Alejandro Lopez MRN: [id number] DOB/AGE: [birth year] 45 y.o.  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   On mechanically assisted ventilation (HCC) 12/21/2024   Tracheostomy care (HCC) 12/21/2024   Physical deconditioning 12/21/2024   Acute respiratory failure with hypoxia (HCC) 12/15/2024   Severe aortic regurgitation 11/30/2024   HFrEF (heart failure with reduced ejection fraction) (HCC) 11/24/2024   Nonrheumatic mitral valve regurgitation 11/24/2024   Nonrheumatic aortic valve insufficiency 11/24/2024   Acute CHF (HCC) 11/23/2024   Acute HFrEF (heart failure with reduced ejection fraction) (HCC) 11/23/2024   CHF (congestive heart failure) (HCC) 11/22/2024   Essential hypertension 11/22/2024   Tobacco use 11/22/2024   Alcohol use disorder, moderate, dependence (HCC) 11/22/2024   Abnormal LFTs 11/22/2024   Low back pain 09/23/2014     Discharge Diagnoses:  Patient Active Problem List   Diagnosis Date Noted   Severe protein-energy malnutrition 12/24/2024   Severe protein-calorie malnutrition 12/23/2024   Ileostomy present (HCC) 12/23/2024   Debility 12/23/2024   Tracheostomy in place [facility]) 12/23/2024   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   On mechanically assisted ventilation (HCC) 12/21/2024   Tracheostomy care (HCC) 12/21/2024   Physical deconditioning 12/21/2024   Acute respiratory failure with hypoxia (HCC) 12/15/2024   Severe aortic regurgitation 11/30/2024   HFrEF (heart failure with reduced ejection  fraction) (HCC) 11/24/2024   Nonrheumatic mitral valve regurgitation 11/24/2024   Nonrheumatic aortic valve insufficiency 11/24/2024   Acute CHF (HCC) 11/23/2024   Acute HFrEF (heart failure with reduced ejection fraction) (HCC) 11/23/2024   CHF (congestive heart failure) (HCC) 11/22/2024   Essential hypertension 11/22/2024   Tobacco use 11/22/2024   Alcohol use disorder, moderate, dependence (HCC) 11/22/2024   Abnormal LFTs 11/22/2024   Low back pain 09/23/2014     Discharged Condition: Stable  Patient is a 43 year old male with past medical history significant for mild hypertension for which he takes the meds who has had increasing lower extremity swelling as well as shortness of breath over the last month.  He was treated for walking pneumonia with azithromycin  and prednisone .  He reports use of an inhaler from time to time and this was not improving his shortness of breath and his lower extremity swelling got worse so he decided to come to the hospital.  Patient reports heavy alcohol use at least 5 drinks per evening.  He does smoke cigarettes.  In the ED he was noted to have significant lower extremity swelling to his thighs and pro BNP that was 3354.  The patient denies previous alcohol withdrawal.  Patient was as noted to have elevated LFTs including AST ALT alk phos and T. bili.  Chest x-ray showed cardiomegaly only.  The EDP bedside ultrasound and felt like the EF was markedly reduced.  HPI: The patient is a 43 year old gentleman with a history of hypertension, 1/2 pack/day smoking, heavy alcohol use (estimated 5 shots per day), history of asthma, and a strong family history of heart disease who presented  with worsening shortness of breath and orthopnea as well as lower extremity edema that has progressed over the last several weeks.  He was initially treated for pneumonia with antibiotics without improvement.  In the emergency department his proBNP was 3354.  Chest x-ray showed  cardiomegaly but no pulmonary edema or pleural effusions.  Electrocardiogram showed sinus tachycardia at 114 with anterior septal Q waves and left atrial enlargement. Hs troponin was 27>27.  He was admitted and started on IV Lasix .  2D echocardiogram showed a left ventricular ejection fraction of 35 to 40% with severely reduced RV systolic function, severe mitral regurgitation, and severe aortic insufficiency.  There is some nodular thickening at the leaflet tips.  The patient had no fever or chills and white blood cell count was 10.6 on admission.  There is no history of drug abuse. BC are negative.  Left ventricular diastolic diameter was 7.2 cm with a systolic diameter of 5.8 cm.  A cardiac MRI showed a diastolic diameter of 7.7 cm.  Left ventricular ejection fraction was 35% with global hypokinesis.  There was no LGE.  RVEF was 30%.  The aortic root and ascending aorta were normal.  The aortic valve was trileaflet with lack of central coaptation.  The regurgitant fraction was 51% consistent with severe AI.  The mitral valve had bileaflet thickening with lack of coaptation of the leaflets and regurgitant fraction of 45% consistent with severe regurgitation.  Cardiac catheterization today showed normal coronary anatomy.  Dr. Lucas discussed that even with LVAD surgery he would require aortic valve replacement and probably mitral valve replacement given the degree of regurgitation. Dr. Lucas did not think he is a transplant candidate at this time given his ongoing smoking and alcohol use. Dr. Lucas thinks the best option is to proceed with aortic and mitral valve replacements with planned LV Impella support with the hope that his left ventricle will recover with Impella followed by GDMT. Potential risks, benefits, and complications of the surgery were discussed with the patient and he agreed to proceed with surgery.  Hospital Course: Patient underwent an aortic valve replacement (size 27 mm) and mitral valve  replacement (size 23 mm), and Impella 5.5. He was transported from the OR to [facility] ICU in stable condition. He was on Epinephrine , Nor epinephrine , and Milrinone  drips. He was extubated later 12/09. Nor epinephrine  was stopped and Epinephrine  drip was slowly weaned. He was started on Digoxin  0.125 mg daily. He has been transfused two times post op thus far. Chest tubes and blake drain were removed on POD 2. He was continued on CIWA precautions, Thiamine , MVI (history of alcohol abuse). He was weaned off the Insulin  drip. His pre op HGA1C is 6.1. He has pre diabetes so will provide nutrition information with discharge paperwork and he will need to follow up with PCP after discharge. Advanced heart failure followed him closely post op and managed his diuresis. He is currently on Lasix  40 mg IV bid. He had thrombocytopenia post op. CXR showed small to moderate right pleural effusion. He underwent a right thoracentesis on 12/11 and 1000 cc were removed. Unfortunately, he had a significant decrease in his hemoglobin to 5.1 and CXR showed a large recurrent pleural effusion. He was given 3 units of PRBCs, 2 platelets, and SQ Heparin  was stopped. A right chest tube was placed. He was put on Epinephrine  for hypotension. He had a CTA which showed active contrast extravasation between the right 9th and 10th ribs with likely extension into the  pleural space, suspicious for ongoing intercostal bleeding likely related to recent thoracentesis, moderate to large right pleural effusion (hemothorax), and  pneumatosis involving small bowel loops in the lower pelvis and the right colon with mesenteric venous gas and portal venous gas, concerning for bowel ischemia. Ultimately, patient underwent a coil embolization of right intercostal artery right 10 th rib by IR. He subsequently underwent exploratory lap, right colectomy without anastomosis, and placement of Abthera wound vacHe remains intubated and on multiple pressors . He returned to  the OR on Sunday for re exploration and re anastomose bowel. He was started on TPN for malnutrition. Levo was weaned on 12/13. Regarding drips, he is on Epinephrine  and Milrinone  as of 12/15. Nor Epinephrine  was added later. He had a fever to 101.7, WBC is 14,100, he and has been on Zosyn  and Vancomycin . Platelets went as low as 56,000 so no Lovenox  or Heparin  for now. He returned to the OR on 12/16 in order to undergo reopening of recent laparotomy, small bowel resection, and end ileostomy. He remains critically ill. Platelets have gone up to 102,000 on 12/17 and Micafungin  has been added to his antibiotic regimen (Candida albicans in trach aspirate 12/14, no organisms in BAL 12/18). He had a fever to 102.2 on 12/17 and blood cultures have been drawn. Blood culture negative to date. CT with non contrast was done (to evaluate retained left hemothorax). Result showed small residual right pleural fluid/hemothorax with right-sided pigtail chest catheter in place and dependent right lung consolidation/atelectasis with near complete right lower lobe opacification. Also, a moderate pericardial effusion, increased from previous study.  He returned to the OR for right VATS and drain hemothorax later 12/18. He had a brief fun of a flutter 12/18 (he has been on Amiodarone  drip). As of 12/19, he is on Milrinone  drip with co ox 72.7 and Nor epinephrine  drip. Micafungin  has been stopped.  Heparin  drip has been restarted. He was extubated 12/19. Unfortunately, hypercarbic on ABG 12/21, increasing CVP and pressor requirement. Epinephrine  drip was restarted and he was re intubated. NGT placed and 1.4L of bilious output was obtained. WBC increased to 22,800.Epinephrine  was stopped 12/22. Cultures showed rare enterobacter cloacae and rare candida albicans in BAL sensitive to Meropenem , which he was already on. Micafungin  was added again. WBC decreased to 14,100 on 12/23. He underwent placement of tracheostomy on 12/23. He then  underwent removal of Impella on 12/24.  Chest tube was removed without complication on 12/25.  He continued on heparin  anticoagulation for A-fib, DVT prophylaxis and AVR/MVR.  He may require Coumadin  due to bowel resection.  This is being investigated by pharmacy.  He is showing improving strength and vent weaning is underway per CCM.  He has completed his Maxipime  and 12/20/2024 with his last day for micafungin .  He has been weaned off all drips.  He has been tolerating short trach trials.  His tube feeding regimens have been adjusted.  He has progressed to a dysphagia 3 diet and oral intake was slowly increasing.   He developed bright re bleeding per rectum on 12/26/24.  Heparin  was discontinued and the Hct was monitored and remained stable. Respiratory status improved so the trach was capped on 1/3.  He tolerated this well allowing for decannulation on 01/05. Per pulmonary/CCM, he is to finish Ceftriaxone  on 12/29/2025. Per pharmacy note yesterday, Heparin  stopped and patient to continue on Coumadin  (for 3 months). He was given 10 mg 01/05. PT and INR are to be monitored daily. Patient will  need follow up with Dr. Lucas after discharge from CIR, a PT/INR appointment for 48 hours after discharge from CIR, and a follow up with advanced heart failure . He is very deconditioned from prolonged hospitalization. He is felt stable for discharge to CIR today.   Consults: pulmonary/intensive care and advanced heart failure, wound care, IR, general surgery  Significant Diagnostic Studies:  Narrative & Impression  CLINICAL DATA:  Provided history: [id number] Encounter for chest tube placement [id number]   EXAM: PORTABLE CHEST 1 VIEW   COMPARISON:  Radiograph yesterday   FINDINGS: Left internal jugular Swan-Ganz catheter tip projects over the right lower lung zone in the region of the inter lobar or lower lobe pulmonary artery. Retraction of approximately 4 cm is recommended for optimal placement. Stable positioning  of Impella device from a left-sided approach. Interval extubation and removal of enteric tube. Presumed mediastinal drains. One of these may represent a left-sided chest tube. No other chest tube is demonstrated. Status post median sternotomy with prosthetic cardiac valves. Cardiomegaly is stable. Increasing bilateral pleural effusions and bibasilar opacities. Vascular congestion with question of developing pulmonary edema. No visible pneumothorax.   IMPRESSION: 1. Left internal jugular Swan-Ganz catheter tip projects over the right lower lung zone in the region of the interlobar or lower lobe pulmonary artery. Retraction of approximately 4 cm is recommended for optimal placement. 2. Interval extubation and removal of enteric tube. Remaining support apparatus is unchanged. 3. Increasing bilateral pleural effusions and bibasilar opacities. 4. Vascular congestion with question of developing pulmonary edema.     Electronically Signed   By: Andrea Gasman M.D.   On: 12/02/2024 10:14   Treatments: Surgery:  Median Sternotomy Extracorporeal circulation 3.   Aortic valve replacement using a 23 mm Edwards INSPIRIS RESILIA pericardial valve. 4.   Chordal sparing Mitral valve replacement using a 27 mm Edwards MITRIS  pericardial valve 5.   Insertion of Impella 5.5 via right axillary artery by Dr. Lucas on 11/30/2024.  6. Exploratory laparotomy, small bowel resection, ABThera negative pressure open abdomen VAC by Dr. Sebastian 12/04/2024.  7. Re-exploration of abdomen, right colectomy without anastomosis,  placement of Abthera wound vac (02393 Negative pressure wound therapy by Dr. Tanda on 12/06/2024.  8. Re opening of recent laparotomy, small bowel resection, end ileostomy by Dr. Ebbie on 12/08/2024.  9. Right VATS, drain retained hemothorax by Dr. Shyrl on 12/10/2024.  Procedures: Right thoracentesis 12/03/2024 Right intercostal arteriography and coil embolization of  right intercostal artery at level of right 10th rib by IR Placement of percutaneous tracheostomy by Pulmonary/CCM 4.  Removal of Impella 12/16/2024 5. Decannulation 12/28/2024.  Discharge Exam: Blood pressure 99/79, pulse (!) 106, temperature 98.1 F (36.7 C), temperature source Oral, resp. rate (!) 23, height 5' 10 (1.778 m), weight 64.2 kg, SpO2 99%. General appearance: alert, cooperative, and no distress Neurologic: intact Heart: regular, mildly tachy Abdomen: ostomy Ok Wound: VAC in place  Discharge Medications:  The patient has been discharged on:   1.Beta Blocker:  Yes [   ]                              No   [ x  ]                              If No, reason:  2.Ace Inhibitor/ARB: Yes [  x ]  No  [    ]                                     If No, reason:  3.Statin:   Yes [   ]                  No  [  x ]                  If No, reason:No CAD  4.Arleene:  Yes  [   ]                  No   [ x  ]                  If No, reason:Bleeding  Patient had ACS upon admission:  Plavix/P2Y12 inhibitor: Yes [   ]                                      No  [ x  ]     Discharge Instructions     Care order/instruction   Complete by: As directed    Pharmacy is dosing patient's Coumadin  dose. Please contact them for daily dosage      Allergies as of 12/28/2024   No Known Allergies      Medication List     STOP taking these medications    albuterol  108 (90 Base) MCG/ACT inhaler Commonly known as: VENTOLIN  HFA Replaced by: albuterol  (2.5 MG/3ML) 0.083% nebulizer solution   azithromycin  250 MG tablet Commonly known as: ZITHROMAX    guaiFENesin  600 MG 12 hr tablet Commonly known as: MUCINEX    ibuprofen  800 MG tablet Commonly known as: ADVIL        TAKE these medications    acetaminophen  160 MG/5ML solution Commonly known as: TYLENOL  Place 20.3 mLs (650 mg total) into feeding tube every 6 (six) hours as needed for fever (For  Fever >101).   albuterol  (2.5 MG/3ML) 0.083% nebulizer solution Commonly known as: PROVENTIL  Take 3 mLs (2.5 mg total) by nebulization every 4 (four) hours as needed for wheezing or shortness of breath. Replaces: albuterol  108 (90 Base) MCG/ACT inhaler   amiodarone  200 MG tablet Commonly known as: PACERONE  Place 1 tablet (200 mg total) into feeding tube 2 (two) times daily.   arformoterol  15 MCG/2ML Nebu Commonly known as: BROVANA  Take 2 mLs (15 mcg total) by nebulization 2 (two) times daily.   cefTRIAXone  1 g injection Commonly known as: ROCEPHIN  Please give 2 grams IV for one dose only at 0900 am 12/29/2024 Start taking on: December 29, 2024   colchicine  0.6 MG tablet Place 0.5 tablets (0.3 mg total) into feeding tube daily.   cyanocobalamin  1000 MCG/ML injection Commonly known as: VITAMIN B12 Inject 1 mL (1,000 mcg total) into the muscle once a week. Start taking on: January 04, 2025   Digoxin  62.5 MCG Tabs Place 0.0625 mg into feeding tube daily. Start taking on: December 29, 2024   diphenoxylate -atropine  2.5-0.025 MG tablet Commonly known as: LOMOTIL  Place 1 tablet into feeding tube 4 (four) times daily.   empagliflozin  10 MG Tabs tablet Commonly known as: JARDIANCE  Take 1 tablet (10 mg total) by mouth daily. Start taking on: December 29, 2024   feeding supplement (PROSource TF20) liquid Place 60 mLs into  feeding tube 2 (two) times daily.   feeding supplement (VITAL 1.5 CAL) Liqd Place 1,000 mLs into feeding tube continuous. Run at 45 ml/hr from 0600 to 1800   feeding supplement (VITAL 1.5 CAL) Liqd Place 1,000 mLs into feeding tube continuous. Run at 75 ml/hr from 1800 to 0600 am   fiber supplement (BANATROL TF) liquid Place 60 mLs into feeding tube 4 (four) times daily.   folic acid  1 MG tablet Commonly known as: FOLVITE  Take 1 tablet (1 mg total) by mouth at bedtime.   free water  Soln Place 100 mLs into feeding tube every 4 (four) hours.   lidocaine  5  % Commonly known as: LIDODERM  Place 1 patch onto the skin daily. Remove & Discard patch within 12 hours or as directed by MD   loperamide  HCl 1 MG/7.5ML suspension Commonly known as: IMODIUM  Place 30 mLs (4 mg total) into feeding tube every 6 (six) hours.   losartan  25 MG tablet Commonly known as: COZAAR  Place 1 tablet (25 mg total) into feeding tube daily. Start taking on: December 29, 2024   morphine  (PF) 2 MG/ML injection Inject 0.5 mLs (1 mg total) into the vein every 3 (three) hours as needed.   mouth rinse Liqd solution 15 mLs by Mouth Rinse route 4 (four) times daily.   mouth rinse Liqd solution 15 mLs by Mouth Rinse route as needed (oral care).   multivitamin with minerals Tabs tablet Place 1 tablet into feeding tube daily. Start taking on: December 29, 2024   oxyCODONE  5 MG immediate release tablet Commonly known as: Oxy IR/ROXICODONE  Place 1-2 tablets (5-10 mg total) into feeding tube every 3 (three) hours as needed for moderate pain (pain score 4-6) or severe pain (pain score 7-10).   pantoprazole  40 MG injection Commonly known as: PROTONIX  Inject 40 mg into the vein daily. Start taking on: December 29, 2024   polycarbophil 625 MG tablet Commonly known as: FIBERCON Place 1 tablet (625 mg total) into feeding tube daily. Start taking on: December 29, 2024   polyethylene glycol 17 g packet Commonly known as: MIRALAX  / GLYCOLAX  Place 17 g into feeding tube daily as needed for mild constipation or moderate constipation.   QUEtiapine  25 MG tablet Commonly known as: SEROQUEL  Place 1 tablet (25 mg total) into feeding tube at bedtime.   Relizorb Devi device 1 Cartridge by Does not apply route daily at 6 (six) AM. Start taking on: December 29, 2024   Relizorb Devi device 2 Cartridges by Does not apply route daily at 2 PM. Start taking on: December 29, 2024   revefenacin  175 MCG/3ML nebulizer solution Commonly known as: YUPELRI  Take 3 mLs (175 mcg total) by nebulization  daily. Start taking on: December 29, 2024   scopolamine  1 MG/3DAYS Commonly known as: TRANSDERM-SCOP Place 1 patch (1 mg total) onto the skin every 3 (three) days. Start taking on: December 29, 2024   simethicone  40 MG/0.6ML drops Commonly known as: MYLICON Place 0.6 mLs (40 mg total) into feeding tube 4 (four) times daily as needed for flatulence.   thiamine  100 MG tablet Commonly known as: Vitamin B-1 Place 1 tablet (100 mg total) into feeding tube at bedtime.        Follow-up Information     [facility] Follow up on 12/07/2024.   Specialty: Cardiology Why: 3:00pm  [facility], Entrance C Free Valet Parking Available Contact information: [address] [place]   [address] [phone]        Bevely Doffing, FNP Follow up.   Specialty: Family Medicine Why: TIME :  8:05 AM DATE : JANUARY 07 , 2026 WEDNESDAY  PLEASE BRING ALL CURRENT MEDICATION, ID and INS CARD , CO-PAY Contact information: [address] [place] [address] [phone]                 Signed:  Kyla CHRISTELLA Donald, PA-C 12/28/2024, 3:24 PM  "

## 2024-11-30 NOTE — Anesthesia Procedure Notes (Signed)
 Arterial Line Insertion Start/End12/07/2024 7:10 AM, 11/30/2024 7:20 AM Performed by: Boyce Shilling, CRNA, CRNA  Patient location: Pre-op. Preanesthetic checklist: patient identified, IV checked, site marked, risks and benefits discussed, surgical consent, monitors and equipment checked, pre-op evaluation, timeout performed and anesthesia consent Lidocaine  1% used for infiltration and patient sedated Left, radial was placed Catheter size: 20 G Hand hygiene performed  and maximum sterile barriers used  Allen's test indicative of satisfactory collateral circulation Attempts: 2 Following insertion, dressing applied and Biopatch. Post procedure assessment: normal and unchanged  Patient tolerated the procedure well with no immediate complications.

## 2024-11-30 NOTE — Anesthesia Preprocedure Evaluation (Signed)
 Anesthesia Evaluation  Patient identified by MRN, date of birth, ID band Patient awake    Reviewed: Allergy & Precautions, NPO status , Patient's Chart, lab work & pertinent test results  Airway        Dental   Pulmonary asthma , Current Smoker and Patient abstained from smoking.          Cardiovascular hypertension, +CHF  + Valvular Problems/Murmurs MR and AI   ECHO: IMPRESSIONS   1. Left ventricular ejection fraction, by estimation, is 35 to 40%. Left ventricular ejection fraction by 3D volume is 40 %. The left ventricle has moderately decreased function. The left ventricle demonstrates global hypokinesis. The left ventricular internal cavity size was severely dilated. There is mild concentric left ventricular hypertrophy. Left ventricular diastolic parameters are consistent with Grade III diastolic dysfunction (restrictive). Elevated left atrial pressure.  2. Right ventricular systolic function is severely reduced. The right ventricular size is normal. There is moderately elevated pulmonary artery systolic pressure. The estimated right ventricular systolic pressure is 48.2 mmHg.  3. Left atrial size was moderately dilated.  4. Right atrial size was mildly dilated.  5. A small pericardial effusion is present. The pericardial effusion is localized near the right atrium.  6. The mitral valve is abnormal, mildly thickened and with incomplete coaptation secondary to annular dilatation. Severe mitral valve regurgitation, posteriorly directed.  7. Tricuspid valve regurgitation is moderate.  8. The aortic valve is tricuspid. There is nodular thickening at the leaflet tips and avariable echodensity seen on ventricular side of valve in parasternal long axis view only (some images are off axis as well). Cannot exclude vegetation or leaflet tip  prolapse. Aortic valve regurgitation is severe. Vena contracta 0.9 cm.  9. The inferior vena cava is  dilated in size with >50% respiratory variability, suggesting right atrial pressure of 8 mmHg.   Comparison(s): No prior Echocardiogram.  MRI IMPRESSION: 1.  Dilated cardiomyopathy with LVEF 35% but without LGE.   2.  Dilated right ventricle with RVEF 30%.   3.  Severe aortic regurgitation.   4.  Severe mitral regurgitation.  Cath:      Neuro/Psych negative neurological ROS  negative psych ROS   GI/Hepatic negative GI ROS, Neg liver ROS,,,  Endo/Other  negative endocrine ROS    Renal/GU negative Renal ROS  negative genitourinary   Musculoskeletal negative musculoskeletal ROS (+)    Abdominal   Peds  Hematology Lab Results      Component                Value               Date                      WBC                      8.3                 11/29/2024                HGB                      16.3                11/29/2024                HCT  47.1                11/29/2024                MCV                      95.9                11/29/2024                PLT                      211                 11/29/2024             Lab Results      Component                Value               Date                      NA                       136                 11/29/2024                K                        4.8                 11/29/2024                CO2                      18 (L)              11/29/2024                GLUCOSE                  102 (H)             11/29/2024                BUN                      24 (H)              11/29/2024                CREATININE               1.27 (H)            11/29/2024                CALCIUM                   8.8 (L)             11/29/2024                GFRNONAA                 >60                 11/29/2024  Anesthesia Other Findings   Reproductive/Obstetrics                              Anesthesia Physical Anesthesia Plan Anesthesia Quick Evaluation

## 2024-11-30 NOTE — Progress Notes (Signed)
 Getting PLTs currently  Hemodynamics a little better-->little more pulsatile and able to wean Epi to 4 mcg/min Still w/ sig CT output. Surgical team aware. Awaiting second Calcium .  Plan Cont MCV as directed by HF Cont milrinone  at current Hold epi at 4 mcg/min Start to wean the NE goal MAP 65-90  Awaiting TEG

## 2024-11-30 NOTE — Progress Notes (Signed)
  Echocardiogram 2D Echocardiogram has been performed.  Alejandro Lopez 11/30/2024, 4:52 PM

## 2024-11-30 NOTE — Consult Note (Addendum)
 NAME:  Alejandro Lopez, MRN:  [id number], DOB:  03-23-1981, LOS: 7 ADMISSION DATE:  11/22/2024, CONSULTATION DATE:  12/8 REFERRING MD:  Lucas, CHIEF COMPLAINT:  post cardiac surgery critical care services    History of Present Illness:  43 year old male patient with history of hypertension, alcohol and tobacco abuse, presented to the emergency room on 11/30 with approximately 1 month history of progressive dyspnea accompanied by lower extremity swelling extending up to the level of his scrotum and abdomen. Diagnostic evaluation by echocardiogram showed left ventricular ejection fraction 35 to 40% with grade 3 diastolic dysfunction this was further complicated by severe mitral valve regurgitation and aortic insufficiency because of this he was transferred to [facility] for further evaluation. He underwent cardiac catheterization on 12/4: Right heart hemodynamic parameters showed baseline right atrial pressure 5 mmHg, PA pressure 32/17, pulmonary capillary wedge pressure at 14 estimated Fick 4.9 L/min with cardiac index Fick calculated at 2.59 His PAPi was 3 Left heart cath was negative for coronary artery disease Went to OR 12/8 for MVR and AVR w/ impella insertion. PCCM asked to assist w/ post op care   OR course EBL: 1735 Received  Products: cryo 92ml, FFP 401 Also received DDAVP , 2200 crystalloid, 250ml albumin   Cell saver 1125 Pump time 4hrs Clamp time 2hrs 50 min  Events: multiple defibrillations intra-op  Intra-op ECHO EF estimated 40%  Pertinent  Medical History  Tobacco abuse, alcohol abuse, hypertension  Significant Hospital Events: Including procedures, antibiotic start and stop dates in addition to other pertinent events   11/30 admitted/. ECHO 35 to 40% with grade 3 diastolic dysfunction this was further complicated by severe mitral valve regurgitation and aortic insufficiency 12/4 left and right heart cath 12/8 AVR and MVR w/ bioprosthetic valves. Arrived on icu w/  Impella 5.5 MCS at flow 4 lpm and P 7   Interim History / Subjective:  Arrived to ICU post op  Initial CT output on arrival 140 ml Sternal dressing marked w/ oozing bloody shadow On Ipella 5.5:  4 l/min @ P7. MAP 65-70. Good flow. Pulsatility every 4th beat   Objective    Blood pressure 96/70, pulse (!) 101, temperature 97.9 F (36.6 C), temperature source Oral, resp. rate (!) 24, height 5' 10 (1.778 m), weight 69.9 kg, SpO2 97%. PAP: (11-48)/(5-18) 15/10      Intake/Output Summary (Last 24 hours) at 11/30/2024 1211 Last data filed at 11/30/2024 1204 Gross per 24 hour  Intake 1550 ml  Output 2550 ml  Net -1000 ml   Filed Weights   11/29/24 0914 11/30/24 0516 11/30/24 0646  Weight: 69.5 kg 70.1 kg 69.9 kg    Examination: General: 43 year old male sedated on propofol  HENT: PERRL left internal jugular PAC. Dressing CD&I Lungs: clear. Breathing over set rate. CT 140 ML bloody drainage Cardiovascular: impella 5.5 set at 4 L/min,P 7. MAP 65-70 sternal dressing marked w/ bloody shadow. Has been oozing the impella 5/5 is in right axillary dressing oozing some Abdomen: soft not tender  Extremities: warm + pulses brisk CR Neuro: sedated GU: cl yellow     Resolved problem list   Assessment and Plan   Severe MR and AI w/ associated acute systolic heart failure and  cardiogenic shock. Now s/p bioprosthetic MVR and AVR 12/8 w/ Impella 5.5 MSC plan F/u post operative ABG, CXR, coags, fibrinogen , CBC, lactate, chemistry and 12-lead Passive rewarming and initiate precedex  wean once normothermic and hemodynamically stable Continue post operative telemetry monitoring for conduction  disturbances Ensure temporary epicardial pacing wires in place with back up rate for symptomatic bradycardia or AVB MAP goal 65-90; using  post-operative orderset w/ PRN pressors, inoptropes and antihypertensive management as indicated.  IMPELLA per Heart failure team  Ensure euvolemic Close observation for  evidence of bleeding, including chest tube output and surgical site.Monitor Chest tube output if  >400cc in 1 hr, >300 for 2 consecutive hours, >200cc for three consecutive hours notify surgical team-->actively treating thrombocytopenia and coagulopathy  Tight glycemic control Multi-modal analgesia  SCDs immediate post-op Initiate post-operative AC once [facility] w/ cardiac surgeon, VKA therapy (to be dosed by pharm and surgical team) and once started to continue for first 3-6 months, followed by lifelong aspirin  (75-100 mg daily)-->holding currently d/t post operative oozing  Complete prophylactic antibiotics     Post op Vent management  Plan F/u pcxr and abg Initiate wean protocol once hemodynamics stable and normothermic VAP bundle  Expected acute blood loss anemia w/ post-operative coagulopathy and thrombocytopenia  EBL 1735, received 1125 cell saver  He received 2 FFP, cryo and DDAVP  Baseline hgb: 16.3; most recent istat hgb 11.2; most recent PLT 107  Plan Trend CBC Transfuse for active bleeding or hgb < 8 Sent fibrinogen  TEG sent; replace as indicated  Transfusing 2 units PLTs empirically now  F/u istat. Replace Ca as needed  Elevated LFTs from congested HF Had been improving Plan Monitor   Pre-diabetes Plan Initiate SSI after insulin  gtt completed Goal 140-180   H/o tobacco abuse and asthma  Plan Cessation  Add duoneb  Labs   CBC: Recent Labs  Lab 11/28/24 1107 11/29/24 0345 11/30/24 0811 11/30/24 1014 11/30/24 1038 11/30/24 1102 11/30/24 1135 11/30/24 1203  WBC 8.1 8.3  --   --   --   --   --   --   HGB 16.2 16.3   < > 12.2* 13.3 14.3 13.6 12.6*  HCT 47.2 47.1   < > 36.0* 39.0 42.0 40.0 37.0*  MCV 94.4 95.9  --   --   --   --   --   --   PLT 209 211  --   --   --   --   --   --    < > = values in this interval not displayed.    Basic Metabolic Panel: Recent Labs  Lab 11/24/24 0510 11/25/24 0305 11/26/24 0406 11/26/24 0724 11/27/24 0205  11/27/24 0830 11/28/24 0137 11/29/24 0345 11/29/24 1629 11/30/24 0811 11/30/24 0814 11/30/24 0920 11/30/24 1009 11/30/24 1014 11/30/24 1038 11/30/24 1102 11/30/24 1135 11/30/24 1203  NA 141 141 140  --  139   < > 134* 140 136 142   < > 137   < > 138 138 136 138 138  K 3.8 3.6 3.8  --  4.1   < > 3.9 4.1 4.8 3.5   < > 4.9   < > 4.2 4.7 4.8 4.9 4.7  CL 103 103 101  --  96*  --  99 102 107 108  --  108  --   --  103  --  103  --   CO2 29 30 33*  --  33*  --  27 26 18*  --   --   --   --   --   --   --   --   --   GLUCOSE 117* 96 108*  --  92  --  127* 110* 102* 96  --  135*  --   --  123*  --  121*  --   BUN 19 20 18   --  21*  --  22* 19 24* 18  --  20  --   --  18  --  18  --   CREATININE 0.91 0.92 1.09  --  1.24  --  1.05 1.10 1.27* 0.60*  --  0.80  --   --  0.80  --  0.80  --   CALCIUM  9.4 9.2 9.0  --  9.1  --  8.6* 9.1 8.8*  --   --   --   --   --   --   --   --   --   MG 1.8 1.9  --  1.7 2.3  --   --   --   --   --   --   --   --   --   --   --   --   --    < > = values in this interval not displayed.   GFR: Estimated Creatinine Clearance: 115.3 mL/min (by C-G formula based on SCr of 0.8 mg/dL). Recent Labs  Lab 11/28/24 1107 11/29/24 0345  WBC 8.1 8.3    Liver Function Tests: Recent Labs  Lab 11/24/24 0510 11/25/24 0305 11/29/24 1629  AST 47* 41 63*  ALT 74* 64* 66*  ALKPHOS 145* 141* 127*  BILITOT 1.0 0.8 0.9  PROT 6.1* 5.9* 6.7  ALBUMIN  3.4* 3.3* 3.1*   No results for input(s): LIPASE, AMYLASE in the last 168 hours. No results for input(s): AMMONIA in the last 168 hours.  ABG    Component Value Date/Time   PHART 7.375 11/30/2024 1203   PCO2ART 43.4 11/30/2024 1203   PO2ART 292 (H) 11/30/2024 1203   HCO3 25.4 11/30/2024 1203   TCO2 27 11/30/2024 1203   ACIDBASEDEF 1.0 11/30/2024 1102   O2SAT 100 11/30/2024 1203     Coagulation Profile: Recent Labs  Lab 11/29/24 1629  INR 1.0    Cardiac Enzymes: No results for input(s): CKTOTAL, CKMB,  CKMBINDEX, TROPONINI in the last 168 hours.  HbA1C: Hgb A1c MFr Bld  Date/Time Value Ref Range Status  11/27/2024 02:05 AM 6.1 (H) 4.8 - 5.6 % Final    Comment:    (NOTE) Diagnosis of Diabetes The following HbA1c ranges recommended by the American Diabetes Association (ADA) may be used as an aid in the diagnosis of diabetes mellitus.  Hemoglobin             Suggested A1C NGSP%              Diagnosis  <5.7                   Non Diabetic  5.7-6.4                Pre-Diabetic  >6.4                   Diabetic  <7.0                   Glycemic control for                       adults with diabetes.      CBG: No results for input(s): GLUCAP in the last 168 hours.  Review of Systems:   Not able   Past Medical History:  He,  has a past medical history of Asthma.   Surgical History:  Past Surgical History:  Procedure Laterality Date   FRACTURE SURGERY     RIGHT HEART CATH AND CORONARY ANGIOGRAPHY N/A 11/27/2024   Procedure: RIGHT HEART CATH AND CORONARY ANGIOGRAPHY;  Surgeon: Zenaida Morene PARAS, MD;  Location: [facility] INVASIVE CV LAB;  Service: Cardiovascular;  Laterality: N/A;     Social History:   reports that he has been smoking cigarettes. He has never used smokeless tobacco. He reports current alcohol use of about 4.0 standard drinks of alcohol per week. He reports current drug use. Drug: Marijuana.   Family History:  His family history includes Diabetes in his mother; Hypertension in his mother; Stroke in his mother.   Allergies No Known Allergies   Home Medications  Prior to Admission medications   Medication Sig Start Date End Date Taking? Authorizing Provider  albuterol  (VENTOLIN  HFA) 108 (90 Base) MCG/ACT inhaler Inhale 2 puffs into the lungs every 4 (four) hours as needed. 10/14/23  Yes Leath-Warren, Etta PARAS, NP  guaiFENesin (MUCINEX) 600 MG 12 hr tablet Take 1,200 mg by mouth 2 (two) times daily as needed for to loosen phlegm or cough.   Yes [provider]  ibuprofen  (ADVIL ) 800 MG tablet Take 1 tablet (800 mg total) by mouth every 8 (eight) hours as needed for moderate pain. 11/07/22  Yes Suzette Pac, MD  azithromycin  (ZITHROMAX ) 250 MG tablet Take 250 mg by mouth as directed. Patient not taking: Reported on 11/23/2024 11/14/24   [provider]     Critical care time:  I personally  spent 45 minutes  on this patient which included: review of medical records, nursing notes, progress notes, evaluation, interpretation of lab data and diagnostic studies, taking independent history, performing exam, documenting plan, ordering diagnostics and interventions for the following critical care issues: Circulatory shock, Acute respiratory failure with the following interventions which included: titration of ventilatory support, titration of hemodynamic drips to desired MAP, prevention of further deterioration

## 2024-11-30 NOTE — Anesthesia Postprocedure Evaluation (Signed)
 Anesthesia Post Note  Patient: Alejandro Lopez  Procedure(s) Performed: TRANSESOPHAGEAL ECHOCARDIOGRAM     Patient location during evaluation: PACU Anesthesia Type: MAC Level of consciousness: awake and alert Pain management: pain level controlled Vital Signs Assessment: post-procedure vital signs reviewed and stable Respiratory status: spontaneous breathing, nonlabored ventilation, respiratory function stable and patient connected to nasal cannula oxygen  Cardiovascular status: stable and blood pressure returned to baseline Postop Assessment: no apparent nausea or vomiting Anesthetic complications: no   No notable events documented.  Last Vitals:  Vitals:   11/30/24 0731 11/30/24 0732  BP:    Pulse: (!) 101 (!) 101  Resp: 17 (!) 24  Temp:    SpO2: 98% 97%    Last Pain:  Vitals:   11/30/24 0646  TempSrc: Oral  PainSc:                  Sandrine Bloodsworth

## 2024-11-30 NOTE — TOC Progression Note (Signed)
 Transition of Care [facility]) - Progression Note    Patient Details  Name: Alejandro Lopez MRN: [id number] Date of Birth: Dec 30, 1980  [facility] CM/SW Contact  Justina Delcia Czar, RN Phone Number: [phone] 11/30/2024, 12:18 PM  Clinical Narrative:    Scheduled for TEE, MVR and AVR 11/30/2024 and impella insertion.    Chart reviewed for discharge readiness, patient not medically stable for d/c. Inpatient CM/CSW will continue to monitor pt's advancement through interdisciplinary progression rounds.   If new pt transition needs arise, MD please place a TOC consult.     Expected Discharge Plan: Home/Self Care Barriers to Discharge: Continued Medical Work up    Expected Discharge Plan and Services In-house Referral: Clinical Social Work     Living arrangements for the past 2 months: Single Family Home                    Social Drivers of Health (SDOH) Interventions SDOH Screenings   Food Insecurity: No Food Insecurity (11/22/2024)  Housing: Low Risk  (11/22/2024)  Transportation Needs: No Transportation Needs (11/22/2024)  Utilities: Not At Risk (11/22/2024)  Social Connections: Unknown (05/06/2022)   Received from [facility]  Tobacco Use: High Risk (11/30/2024)    Readmission Risk Interventions     No data to display

## 2024-11-30 NOTE — Progress Notes (Signed)
 Last hour CT output ~100cc, thicker. Total ~700cc out. Stable dressings, no changes at impella insertion site.   TEG resulted- needs cryo. Checking INR, CBC now Borderline ionized Ca+, giving additional 1g now. Keep sedation and vent overnight, will reassess in AM with bleeding more controlled. D/w Dr. Lucas.   Alejandro SHAUNNA Gaskins, DO 11/30/24 8:05 PM [facility]  For contact information, see Amion. If no response to pager, please call PCCM consult pager. After hours, 7PM- 7AM, please call Elink.

## 2024-12-01 ENCOUNTER — Encounter (HOSPITAL_COMMUNITY): Payer: Self-pay | Admitting: Cardiology

## 2024-12-01 ENCOUNTER — Inpatient Hospital Stay (HOSPITAL_COMMUNITY)

## 2024-12-01 LAB — CBC
HCT: 22.9 % — ABNORMAL LOW (ref 39.0–52.0)
HCT: 24.7 % — ABNORMAL LOW (ref 39.0–52.0)
Hemoglobin: 8 g/dL — ABNORMAL LOW (ref 13.0–17.0)
Hemoglobin: 8.9 g/dL — ABNORMAL LOW (ref 13.0–17.0)
MCH: 32.7 pg (ref 26.0–34.0)
MCH: 32.5 pg (ref 26.0–34.0)
MCHC: 34.9 g/dL (ref 30.0–36.0)
MCHC: 36 g/dL (ref 30.0–36.0)
MCV: 93.5 fL (ref 80.0–100.0)
MCV: 90.1 fL (ref 80.0–100.0)
Platelets: 116 K/uL — ABNORMAL LOW (ref 150–400)
Platelets: 98 K/uL — ABNORMAL LOW (ref 150–400)
RBC: 2.45 MIL/uL — ABNORMAL LOW (ref 4.22–5.81)
RBC: 2.74 MIL/uL — ABNORMAL LOW (ref 4.22–5.81)
RDW: 15.2 % (ref 11.5–15.5)
RDW: 15.9 % — ABNORMAL HIGH (ref 11.5–15.5)
WBC: 9.8 K/uL (ref 4.0–10.5)
WBC: 13.5 K/uL — ABNORMAL HIGH (ref 4.0–10.5)
nRBC: 0 % (ref 0.0–0.2)
nRBC: 0 % (ref 0.0–0.2)

## 2024-12-01 LAB — GLUCOSE, CAPILLARY
Glucose-Capillary: 115 mg/dL — ABNORMAL HIGH (ref 70–99)
Glucose-Capillary: 117 mg/dL — ABNORMAL HIGH (ref 70–99)
Glucose-Capillary: 121 mg/dL — ABNORMAL HIGH (ref 70–99)
Glucose-Capillary: 122 mg/dL — ABNORMAL HIGH (ref 70–99)
Glucose-Capillary: 124 mg/dL — ABNORMAL HIGH (ref 70–99)
Glucose-Capillary: 126 mg/dL — ABNORMAL HIGH (ref 70–99)
Glucose-Capillary: 143 mg/dL — ABNORMAL HIGH (ref 70–99)
Glucose-Capillary: 144 mg/dL — ABNORMAL HIGH (ref 70–99)
Glucose-Capillary: 147 mg/dL — ABNORMAL HIGH (ref 70–99)
Glucose-Capillary: 148 mg/dL — ABNORMAL HIGH (ref 70–99)
Glucose-Capillary: 163 mg/dL — ABNORMAL HIGH (ref 70–99)
Glucose-Capillary: 203 mg/dL — ABNORMAL HIGH (ref 70–99)
Glucose-Capillary: 221 mg/dL — ABNORMAL HIGH (ref 70–99)

## 2024-12-01 LAB — ECHO INTRAOPERATIVE TEE
AR max vel: 4.8 cm2
AV Area VTI: 4.47 cm2
AV Area mean vel: 4.16 cm2
AV Mean grad: 1 mmHg
AV Peak grad: 1.7 mmHg
AV Vena cont: 0.45 cm
Ao pk vel: 0.65 m/s
Height: 70 in
MV M vel: 5.53 m/s
MV Peak grad: 122.3 mmHg
MV VTI: 2.18 cm2
MV Vena cont: 0.75 cm
P 1/2 time: 204 ms
Radius: 1.5 cm
S' Lateral: 7.1 cm
Weight: 2464 [oz_av]

## 2024-12-01 LAB — BPAM FFP
Blood Product Expiration Date: 202512092359
Blood Product Expiration Date: 202512092359
Blood Product Expiration Date: 202512092359
Blood Product Expiration Date: 202512092359
ISSUE DATE / TIME: 202512081443
ISSUE DATE / TIME: 202512081443
ISSUE DATE / TIME: 202512081818
ISSUE DATE / TIME: 202512081818
Unit Type and Rh: 6200
Unit Type and Rh: 6200
Unit Type and Rh: 6200
Unit Type and Rh: 6200

## 2024-12-01 LAB — PREPARE FRESH FROZEN PLASMA: Unit division: 0

## 2024-12-01 LAB — POCT I-STAT 7, (LYTES, BLD GAS, ICA,H+H)
Acid-Base Excess: 1 mmol/L (ref 0.0–2.0)
Acid-Base Excess: 0 mmol/L (ref 0.0–2.0)
Acid-base deficit: 3 mmol/L — ABNORMAL HIGH (ref 0.0–2.0)
Bicarbonate: 22.3 mmol/L (ref 20.0–28.0)
Bicarbonate: 25.3 mmol/L (ref 20.0–28.0)
Bicarbonate: 24.4 mmol/L (ref 20.0–28.0)
Calcium, Ion: 1.2 mmol/L (ref 1.15–1.40)
Calcium, Ion: 1.14 mmol/L — ABNORMAL LOW (ref 1.15–1.40)
Calcium, Ion: 1.14 mmol/L — ABNORMAL LOW (ref 1.15–1.40)
HCT: 23 % — ABNORMAL LOW (ref 39.0–52.0)
HCT: 21 % — ABNORMAL LOW (ref 39.0–52.0)
HCT: 26 % — ABNORMAL LOW (ref 39.0–52.0)
Hemoglobin: 7.8 g/dL — ABNORMAL LOW (ref 13.0–17.0)
Hemoglobin: 7.1 g/dL — ABNORMAL LOW (ref 13.0–17.0)
Hemoglobin: 8.8 g/dL — ABNORMAL LOW (ref 13.0–17.0)
O2 Saturation: 100 %
O2 Saturation: 100 %
O2 Saturation: 99 %
Patient temperature: 37
Patient temperature: 37.2
Patient temperature: 37.2
Potassium: 4.4 mmol/L (ref 3.5–5.1)
Potassium: 4.2 mmol/L (ref 3.5–5.1)
Potassium: 4.3 mmol/L (ref 3.5–5.1)
Sodium: 139 mmol/L (ref 135–145)
Sodium: 140 mmol/L (ref 135–145)
Sodium: 138 mmol/L (ref 135–145)
TCO2: 23 mmol/L (ref 22–32)
TCO2: 26 mmol/L (ref 22–32)
TCO2: 25 mmol/L (ref 22–32)
pCO2 arterial: 37.6 mmHg (ref 32–48)
pCO2 arterial: 36.4 mmHg (ref 32–48)
pCO2 arterial: 36.3 mmHg (ref 32–48)
pH, Arterial: 7.381 (ref 7.35–7.45)
pH, Arterial: 7.451 — ABNORMAL HIGH (ref 7.35–7.45)
pH, Arterial: 7.436 (ref 7.35–7.45)
pO2, Arterial: 255 mmHg — ABNORMAL HIGH (ref 83–108)
pO2, Arterial: 203 mmHg — ABNORMAL HIGH (ref 83–108)
pO2, Arterial: 144 mmHg — ABNORMAL HIGH (ref 83–108)

## 2024-12-01 LAB — PREPARE PLATELET PHERESIS
Unit division: 0
Unit division: 0

## 2024-12-01 LAB — BASIC METABOLIC PANEL WITH GFR
Anion gap: 10 (ref 5–15)
Anion gap: 7 (ref 5–15)
BUN: 11 mg/dL (ref 6–20)
BUN: 13 mg/dL (ref 6–20)
CO2: 16 mmol/L — ABNORMAL LOW (ref 22–32)
CO2: 26 mmol/L (ref 22–32)
Calcium: 7.5 mg/dL — ABNORMAL LOW (ref 8.9–10.3)
Calcium: 8 mg/dL — ABNORMAL LOW (ref 8.9–10.3)
Chloride: 109 mmol/L (ref 98–111)
Chloride: 102 mmol/L (ref 98–111)
Creatinine, Ser: 0.84 mg/dL (ref 0.61–1.24)
Creatinine, Ser: 0.85 mg/dL (ref 0.61–1.24)
GFR, Estimated: >60 mL/min (ref >60)
GFR, Estimated: >60 mL/min (ref >60)
Glucose, Bld: 119 mg/dL — ABNORMAL HIGH (ref 70–99)
Glucose, Bld: 108 mg/dL — ABNORMAL HIGH (ref 70–99)
Potassium: 4.3 mmol/L (ref 3.5–5.1)
Potassium: 4.4 mmol/L (ref 3.5–5.1)
Sodium: 135 mmol/L (ref 135–145)
Sodium: 135 mmol/L (ref 135–145)

## 2024-12-01 LAB — PREPARE CRYOPRECIPITATE
Unit division: 0
Unit division: 0

## 2024-12-01 LAB — BPAM PLATELET PHERESIS
Blood Product Expiration Date: 202512092359
Blood Product Expiration Date: 202512102359
ISSUE DATE / TIME: 202512081700
ISSUE DATE / TIME: 202512081703
Unit Type and Rh: 6200
Unit Type and Rh: 6200

## 2024-12-01 LAB — BPAM CRYOPRECIPITATE
Blood Product Expiration Date: 202512102359
Blood Product Expiration Date: 202512112359
ISSUE DATE / TIME: 202512081302
ISSUE DATE / TIME: 202512082006
Unit Type and Rh: 5100
Unit Type and Rh: 6200

## 2024-12-01 LAB — ECHOCARDIOGRAM LIMITED
Est EF: <20
Height: 70 in
Weight: 2464 [oz_av]

## 2024-12-01 LAB — COOXEMETRY PANEL
Carboxyhemoglobin: 1.5 % (ref 0.5–1.5)
Methemoglobin: <0.7 % (ref 0.0–1.5)
O2 Saturation: 61.3 %
Total hemoglobin: 8.1 g/dL — ABNORMAL LOW (ref 12.0–16.0)

## 2024-12-01 LAB — PREPARE RBC (CROSSMATCH)

## 2024-12-01 LAB — MAGNESIUM
Magnesium: 2.2 mg/dL (ref 1.7–2.4)
Magnesium: 1.7 mg/dL (ref 1.7–2.4)

## 2024-12-01 LAB — LACTATE DEHYDROGENASE: LDH: 290 U/L — ABNORMAL HIGH (ref 105–235)

## 2024-12-01 MED ORDER — ORAL CARE MOUTH RINSE
15.0000 mL | OROMUCOSAL | Status: DC
  Administered 2024-12-01 – 2024-12-02 (×19): 15 mL via OROMUCOSAL

## 2024-12-01 MED ORDER — ORAL CARE MOUTH RINSE: 15.0000 mL | OROMUCOSAL | Status: DC | PRN

## 2024-12-01 MED ORDER — SODIUM CHLORIDE 0.9% IV SOLUTION: Freq: Once | INTRAVENOUS | Status: AC

## 2024-12-01 MED ORDER — CHLORHEXIDINE GLUCONATE CLOTH 2 % EX PADS
6.0000 | MEDICATED_PAD | Freq: Every day | CUTANEOUS | Status: DC
  Administered 2024-12-01 – 2024-12-28 (×28): 6 via TOPICAL

## 2024-12-01 MED ORDER — MAGNESIUM SULFATE 2 GM/50ML IV SOLN
2.0000 g | Freq: Once | INTRAVENOUS | Status: AC
  Administered 2024-12-01: 2 g via INTRAVENOUS
  Filled 2024-12-01: qty 50

## 2024-12-01 MED ORDER — INSULIN ASPART 100 UNIT/ML IJ SOLN
0.0000 [IU] | INTRAMUSCULAR | Status: DC
  Administered 2024-12-01: 2 [IU] via SUBCUTANEOUS
  Administered 2024-12-01: 4 [IU] via SUBCUTANEOUS
  Administered 2024-12-02 (×4): 2 [IU] via SUBCUTANEOUS
  Filled 2024-12-01 (×5): qty 2

## 2024-12-01 MED ORDER — FUROSEMIDE 10 MG/ML IJ SOLN
80.0000 mg | Freq: Two times a day (BID) | INTRAMUSCULAR | Status: DC
  Administered 2024-12-01: 80 mg via INTRAVENOUS
  Filled 2024-12-01: qty 8

## 2024-12-01 MED ORDER — SODIUM BICARBONATE 8.4 % IV SOLN
50.0000 meq | Freq: Once | INTRAVENOUS | Status: AC
  Administered 2024-12-01: 50 meq via INTRAVENOUS
  Filled 2024-12-01: qty 50

## 2024-12-01 NOTE — Progress Notes (Signed)
  On Milrinone  0.25, Norepi 2 mcg, Epi 4, Vaso 0.04 .Flow (Liters/min): 3.9 Liters/min Performance Level: P7 Recent Labs    11/30/24 1626 12/01/24 0458  LDH 307* 290*    PAP: (-7-99)/(-11-94) 40/37 CVP:  [0 mmHg-40 mmHg] 7 mmHg CO:  [2.8 L/min-5.2 L/min] 5.1 L/min CI:  [1.5 L/min/m2-2.8 L/min/m2] 2.7 L/min/m2  Brisk diuresis with IV lasix . Stop lasix . Reassess in am.   Greig Mosses NP 4:46 PM

## 2024-12-01 NOTE — Progress Notes (Signed)
 1 Day Post-Op Procedure(s) (LRB): REPLACEMENT, AORTIC VALVE, OPEN WITH INSPIRIS RESILIA AORTIC VALVE 23mm (N/A) REPLACEMENT, MITRAL VALVE WITH MITRIS RESILIA MITRAL VALVE 27mm (N/A) INSERTION, CARDIAC ASSIST DEVICE, IMPELLA (N/A) ECHOCARDIOGRAM, TRANSESOPHAGEAL, INTRAOPERATIVE (N/A) Subjective:  Woke up a few times overnight with agitation, NSVT but awake and calm this am on vent, Precedex  and Fentanyl .  CT output decreased overnight. Now minimal. CXR this am looks good with no pleural effusions.  Objective: Vital signs in last 24 hours: Temp:  [97.3 F (36.3 C)-98.6 F (37 C)] 98.1 F (36.7 C) (12/09 0400) Pulse Rate:  [33-194] 90 (12/09 0400) Cardiac Rhythm: Normal sinus rhythm (12/09 0347) Resp:  [15-29] 18 (12/09 0400) BP: (96-107)/(67-77) 96/67 (12/08 1620) SpO2:  [94 %-100 %] 100 % (12/09 0400) Arterial Line BP: (60-139)/(55-97) 80/77 (12/09 0400) FiO2 (%):  [50 %] 50 % (12/09 0311) Weight:  [74 kg] 74 kg (12/09 0500)  Hemodynamic parameters for last 24 hours: PAP: (11-71)/(-2-49) 26/14 CVP:  [4 mmHg-34 mmHg] 12 mmHg CO:  [2.8 L/min-4.4 L/min] 4 L/min CI:  [1.5 L/min/m2-2.4 L/min/m2] 2.1 L/min/m2  Intake/Output from previous day: 12/08 0701 - 12/09 0700 In: 9544.4 [I.V.:4288; Blood:3240.5; IV Piggyback:1820.2] Out: 7550 [Urine:4370; Drains:35; Blood:1735; Chest Tube:1410] Intake/Output this shift: Total I/O In: 3851.5 [I.V.:1566.9; Blood:1155.8; Other:144.3; IV Piggyback:984.5] Out: 1450 [Urine:710; Drains:20; Chest Tube:720]  General appearance: edematous, eyes open, following commands and nodding appropriately Neurologic: intact Heart: regular rate and rhythm Lungs: clear to auscultation bilaterally Extremities: edema moderate Wound: dressings dry but with dried blood from early postop. Minimal chest tube output. Blake drain in axillary wound with minimal drainage.  Lab Results: Recent Labs    11/30/24 2329 12/01/24 0454 12/01/24 0458  WBC 9.1  --  9.8   HGB 7.5* 7.8* 8.0*  HCT 22.3* 23.0* 22.9*  PLT 129*  --  116*   BMET:  Recent Labs    11/29/24 1629 11/30/24 0811 11/30/24 1432 11/30/24 1441 12/01/24 0454 12/01/24 0458  NA 136   < > 143   < > 139 135  K 4.8   < > 3.9   < > 4.4 4.3  CL 107   < > 104  --   --  109  CO2 18*  --   --   --   --  16*  GLUCOSE 102*   < > 133*  --   --  119*  BUN 24*   < > 15  --   --  11  CREATININE 1.27*   < > 0.70  --   --  0.84  CALCIUM  8.8*  --   --   --   --  7.5*   < > = values in this interval not displayed.    PT/INR:  Recent Labs    11/30/24 1950  LABPROT 17.2*  INR 1.3*   ABG    Component Value Date/Time   PHART 7.381 12/01/2024 0454   HCO3 22.3 12/01/2024 0454   TCO2 23 12/01/2024 0454   ACIDBASEDEF 3.0 (H) 12/01/2024 0454   O2SAT 61.3 12/01/2024 0458   CBG (last 3)  Recent Labs    12/01/24 0306 12/01/24 0424 12/01/24 0524  GLUCAP 148* 143* 121*   ECG:  Accelerated junctional 90, prolonged QTc 530  on IV amio.  Assessment/Plan: S/P Procedure(s) (LRB): REPLACEMENT, AORTIC VALVE, OPEN WITH INSPIRIS RESILIA AORTIC VALVE 23mm (N/A) REPLACEMENT, MITRAL VALVE WITH MITRIS RESILIA MITRAL VALVE 27mm (N/A) INSERTION, CARDIAC ASSIST DEVICE, IMPELLA (N/A) ECHOCARDIOGRAM, TRANSESOPHAGEAL, INTRAOPERATIVE (N/A)  POD 1 Hemodynamically  stable this am on Impella P7, epi 4, milrinone  0.25, NE 10. Co-ox 61, SVO2 69, CI 2.3.  Wean vent to extubate per CCM.  Coagulopathy with acute postop blood loss anemia: Hgb 8.0 this am. Will transfuse and diurese today.  Ultimately would plan to put on Coumadin for 3 months with two valves but will have to wait until Impella removed. I don't think there is any hurry for anticoagulation at this time.  Keep chest tubes in today.     LOS: 8 days    Alejandro Lopez 12/01/2024

## 2024-12-01 NOTE — Procedures (Signed)
 Extubation Procedure Note  Patient Details:   Name: Alejandro Lopez DOB: 07-31-1981 MRN: [id number]   Airway Documentation:    Vent end date: 12/01/24 Vent end time: 0821   Evaluation  O2 sats: stable throughout Complications: No apparent complications Patient did tolerate procedure well. Bilateral Breath Sounds: Clear, Diminished   Yes  Positive cuff leak noted. Patient placed on 4L [place] with humidity, no stridor noted. Patient able to reach 500 mL with coaching on the incentive spirometer.  Cy RAMAN Melody Savidge 12/01/2024, 8:26 AM

## 2024-12-01 NOTE — Progress Notes (Signed)
 eLink Physician-Brief Progress Note Patient Name: CAMRON MONDAY DOB: Jun 01, 1981 MRN: [id number]   Date of Service  12/01/2024  HPI/Events of Note  Mg++ 1.7  eICU Interventions  Mag Sulfate 2 gm iv x 1 ordered.        Kyson Kupper U Torion Hulgan 12/01/2024, 9:16 PM

## 2024-12-01 NOTE — Progress Notes (Signed)
 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  12/9 VT/VF ? vagal episode   Flow (Liters/min): 3.6 Liters/min Performance Level: P7 Recent Labs    11/30/24 1626 12/01/24 0458  LDH 307* 290*   CO-OX 61%.  PAP: (11-71)/(-2-49) 20/16 CVP:  [4 mmHg-34 mmHg] 12 mmHg CO:  [2.8 L/min-4.4 L/min] 4.2 L/min CI:  [1.5 L/min/m2-2.4 L/min/m2] 2.3 L/min/m2  Milrinone  0.25, Norepi 10 mcg, Epi 4 mcg+ amio    Awake on the vent following commands.  Objective:   Weight Range: 74 kg Body mass index is 23.41 kg/m.   Vital Signs:   Temp:  [97.3 F (36.3 C)-98.6 F (37 C)] 98.4 F (36.9 C) (12/09 0700) Pulse Rate:  [33-194] 90 (12/09 0400) Resp:  [15-29] 17 (12/09 0700) BP: (96)/(67-70) 96/67 (12/08 1620) SpO2:  [94 %-100 %] 100 % (12/09 0400) Arterial Line BP: (60-139)/(55-97) 79/75 (12/09 0500) FiO2 (%):  [50 %] 50 % (12/09 0311) Weight:  [74 kg] 74 kg (12/09 0500) Last BM Date : 11/29/24  Weight change: Filed Weights   11/30/24 0516 11/30/24 0646 12/01/24 0500  Weight: 70.1 kg 69.9 kg 74 kg    Intake/Output:   Intake/Output Summary (Last 24 hours) at 12/01/2024 0713 Last data filed at 12/01/2024 0700 Gross per 24 hour  Intake 9696.6 ml  Output 7550 ml  Net 2146.6 ml      Physical Exam   General:   Intubated  Cor: Regular rate & rhythm. R axillary impella . JP drain MTs. Sternal dressing with bloody drainagel.  Lungs: clear Abdomen: soft, nontender, nondistended.  Extremities: no  edema Neuro: Intubated follows commands.   Telemetry  ST 100s   EKG     Labs    CBC Recent Labs    11/30/24 2329 12/01/24 0454 12/01/24 0458  WBC 9.1  --  9.8  HGB 7.5* 7.8* 8.0*  HCT 22.3* 23.0* 22.9*  MCV 97.4  --  93.5  PLT 129*  --  116*   Basic Metabolic Panel Recent Labs    87/92/74 1629 11/30/24 0811 11/30/24 1432 11/30/24 1441  12/01/24 0454 12/01/24 0458  NA 136   < > 143   < > 139 135  K 4.8   < > 3.9   < > 4.4 4.3  CL 107   < > 104  --   --  109  CO2 18*  --   --   --   --  16*  GLUCOSE 102*   < > 133*  --   --  119*  BUN 24*   < > 15  --   --  11  CREATININE 1.27*   < > 0.70  --   --  0.84  CALCIUM  8.8*  --   --   --   --  7.5*  MG  --   --   --   --   --  2.2   < > = values in this interval not displayed.   Liver Function Tests Recent Labs    11/29/24 1629  AST 63*  ALT 66*  ALKPHOS 127*  BILITOT 0.9  PROT 6.7  ALBUMIN  3.1*   No results for input(s): LIPASE, AMYLASE in the last 72 hours. Cardiac Enzymes No results for input(s): CKTOTAL, CKMB, CKMBINDEX, TROPONINI in the last 72 hours.  BNP: BNP (last 3 results) No results for input(s):  BNP in the last 8760 hours.  ProBNP (last 3 results) Recent Labs    11/22/24 1958  PROBNP 3,354.0*     D-Dimer No results for input(s): DDIMER in the last 72 hours. Hemoglobin A1C No results for input(s): HGBA1C in the last 72 hours. Fasting Lipid Panel No results for input(s): CHOL, HDL, LDLCALC, TRIG, CHOLHDL, LDLDIRECT in the last 72 hours. Thyroid Function Tests No results for input(s): TSH, T4TOTAL, T3FREE, THYROIDAB in the last 72 hours.  Invalid input(s): FREET3  Other results:   Imaging    DG Chest Port 1 View Result Date: 12/01/2024 CLINICAL DATA:  Pneumothorax. EXAM: PORTABLE CHEST 1 VIEW COMPARISON:  11/30/2024 FINDINGS: Endotracheal tube tip is approximately 6.9 cm above the carina. NG tube is incompletely visualized but the tip overlies the gastric fundus. Left IJ pulmonary artery catheter tip is in the right main pulmonary artery, potentially just into the interlobar branch. Impella device again noted, stable position. Mediastinal/pericardial drains evident. Minimal basilar atelectasis without substantial effusion. No findings to suggest edema. IMPRESSION: 1. No substantial change.  No  evidence for pneumothorax. Electronically Signed   By: Camellia Candle M.D.   On: 12/01/2024 06:32   ECHOCARDIOGRAM LIMITED Result Date: 12/01/2024    ECHOCARDIOGRAM LIMITED REPORT   Patient Name:   Alejandro Lopez Date of Exam: 11/30/2024 Medical Rec #:  [id number]        Height: Accession #:    [id number]       Weight: Date of Birth:  [birth year]       BSA: Patient Age:    43 years         BP:           70/67 mmHg Patient Gender: M                HR:           91 bpm. Exam Location:  Inpatient Procedure: Limited Echo, Limited Color Doppler and Cardiac Doppler (Both            Spectral and Color Flow Doppler were utilized during procedure). STAT ECHO Indications:    Congestive Heart Failure I50.9  History:        Patient has prior history of Echocardiogram examinations, most                 recent 11/30/2024. CHF, s/p AVR, s/p MVR, procedure date:                 11/30/2024; Risk Factors:Current Smoker and Hypertension.                 Aortic Valve: bioprosthetic valve is present in the aortic                 position.                 Mitral Valve: bioprosthetic valve valve is present in the mitral                 position.  Sonographer:    Koleen Popper RDCS Referring Phys: [id number] DAMARCUS A INGRAM  Sonographer Comments: Echo performed with patient supine and on artificial respirator. IMPRESSIONS  1. Impella 5.5 noted in good position, around 5cm from the aortic annulus. There is a marked change in LV wall thickness suggesting significant myocardial edema in the setting of recent surgery. Left ventricular ejection fraction, by estimation, is <20%. The left ventricle has severely decreased function. There is severe left ventricular hypertrophy. There  is the interventricular septum is flattened in systole, consistent with right ventricular pressure overload.  2. Right ventricular systolic function is moderately reduced. The right ventricular size is mildly enlarged. Moderately increased right ventricular wall  thickness.  3. The mitral valve has been repaired/replaced. There is a bioprosthetic valve present in the mitral position.  4. The aortic valve has been repaired/replaced. There is a bioprosthetic valve present in the aortic position. FINDINGS  Left Ventricle: Impella 5.5 noted in good position, around 5cm from the aortic annulus. There is a marked change in LV wall thickness suggesting significant myocardial edema in the setting of recent surgery. Left ventricular ejection fraction, by estimation, is <20%. The left ventricle has severely decreased function. There is severe left ventricular hypertrophy. The interventricular septum is flattened in systole, consistent with right ventricular pressure overload. Right Ventricle: The right ventricular size is mildly enlarged. Moderately increased right ventricular wall thickness. Right ventricular systolic function is moderately reduced. Pericardium: There is no evidence of pericardial effusion. Mitral Valve: The mitral valve has been repaired/replaced. There is a bioprosthetic valve present in the mitral position. Tricuspid Valve: The tricuspid valve is normal in structure. Tricuspid valve regurgitation is mild. Aortic Valve: The aortic valve has been repaired/replaced. There is a bioprosthetic valve present in the aortic position. Additional Comments: A venous catheter is visualized. Spectral Doppler performed. Color Doppler performed.  Morene Brownie Electronically signed by Morene Brownie Signature Date/Time: 12/01/2024/1:56:35 AM    Final    DG Chest Port 1 View Result Date: 11/30/2024 EXAM: 1 VIEW(S) XRAY OF THE CHEST 11/30/2024 05:20:00 PM COMPARISON: 11/22/2024 CLINICAL HISTORY: Pneumothorax. FINDINGS: LINES, TUBES AND DEVICES: Endotracheal tube tip is 4 cm above the carina. Swan-Ganz catheter tip is in the central right pulmonary artery. Impella device projects over the left heart. LUNGS AND PLEURA: No confluent opacity. No pleural effusion. No pneumothorax.  HEART AND MEDIASTINUM: Changes of median sternotomy and valve replacement. The heart is borderline in size. BONES AND SOFT TISSUES: No acute osseous abnormality. IMPRESSION: 1. Postoperative changes. No pneumothorax. Electronically signed by: Franky Crease MD 11/30/2024 05:32 PM EST RP Workstation: [id number]   ECHOCARDIOGRAM LIMITED Result Date: 11/30/2024    ECHOCARDIOGRAM LIMITED REPORT   Patient Name:   Alejandro Lopez Date of Exam: 11/30/2024 Medical Rec #:  [id number]        Height:       70.0 in Accession #:    [id number]       Weight:       154.0 lb Date of Birth:  Jun 05, 1981       BSA:          1.868 m Patient Age:    43 years         BP:           96/67 mmHg Patient Gender: M                HR:           105 bpm. Exam Location:  Inpatient Procedure: Limited Echo (Both Spectral and Color Flow Doppler were utilized            during procedure). Indications:    congestive heart failure  History:        Patient has prior history of Echocardiogram examinations, most                 recent 11/30/2024. CHF, impella; Risk Factors:Current Smoker and  Hypertension.                 Aortic Valve: 23 mm Inspiris Resilia valve is present in the                 aortic position. Procedure Date: 11/30/24.                 Mitral Valve: 27 mm Mitris Resilia valve is present in the                 mitral position. Procedure Date: 11/30/24.  Sonographer:    Tinnie Barefoot RDCS Referring Phys: [id number] DONIELLE M ZIMMERMAN IMPRESSIONS  1. Limited echo for impella placement - noted to extend from 5.1 to 5.6 cm beyond the aortic valve in the left ventricle  2. There is a 27 mm Mitris Resilia present in the mitral position. Procedure Date: 11/30/24.  3. There is a 23 mm Inspiris Resilia valve present in the aortic position. Procedure Date: 11/30/24.  4. Left ventricular ejection fraction, by estimation, is 25 to 30%. The left ventricle has severely decreased function. FINDINGS  Left Ventricle: Left ventricular ejection  fraction, by estimation, is 25 to 30%. The left ventricle has severely decreased function. Mitral Valve: There is a 27 mm Mitris Resilia present in the mitral position. Procedure Date: 11/30/24. MV peak gradient, 6.6 mmHg. The mean mitral valve gradient is 3.0 mmHg. Aortic Valve: There is a 23 mm Inspiris Resilia valve present in the aortic position. Procedure Date: 11/30/24. Additional Comments: A device lead is visualized. Spectral Doppler performed. Color Doppler performed.  MITRAL VALVE MV Peak grad: 6.6 mmHg MV Mean grad: 3.0 mmHg MV Vmax:      1.28 m/s MV Vmean:     76.0 cm/s Vinie Maxcy MD Electronically signed by Vinie Maxcy MD Signature Date/Time: 11/30/2024/5:18:25 PM    Final    DG C-Arm 1-60 Min-No Report Result Date: 11/30/2024 Fluoroscopy was utilized by the requesting physician.  No radiographic interpretation.     Medications:     Scheduled Medications:  sodium chloride    Intravenous Once   sodium chloride    Intravenous Once   sodium chloride    Intravenous Once   acetaminophen   1,000 mg Oral Q6H   Or   acetaminophen  (TYLENOL ) oral liquid 160 mg/5 mL  1,000 mg Per Tube Q6H   arformoterol   15 mcg Nebulization BID   bisacodyl   10 mg Oral Daily   Or   bisacodyl   10 mg Rectal Daily   chlorhexidine   15 mL Mouth/Throat NOW   Chlorhexidine  Gluconate Cloth  6 each Topical Daily   docusate sodium   200 mg Oral Daily   folic acid   1 mg Oral Daily   furosemide   80 mg Intravenous BID   metoCLOPramide  (REGLAN ) injection  10 mg Intravenous Q6H   multivitamin with minerals  1 tablet Oral Daily   mupirocin  ointment  1 Application Nasal BID   mouth rinse  15 mL Mouth Rinse Q2H   [START ON 12/02/2024] pantoprazole   40 mg Oral Daily   pantoprazole  (PROTONIX ) IV  40 mg Intravenous QHS   revefenacin   175 mcg Nebulization Daily   sodium chloride  flush  3 mL Intravenous Q12H   thiamine   100 mg Oral Daily    Infusions:  sodium chloride  Stopped (12/01/24 0617)   sodium chloride       sodium chloride  Stopped (12/01/24 0604)   sodium chloride      sodium chloride      sodium chloride   albumin  human Stopped (12/01/24 0008)   amiodarone  30 mg/hr (12/01/24 0700)    ceFAZolin  (ANCEF ) IV Stopped (12/01/24 0600)   dexmedetomidine  (PRECEDEX ) IV infusion 1.2 mcg/kg/hr (12/01/24 0700)   epinephrine  4 mcg/min (12/01/24 0700)   fentaNYL  infusion INTRAVENOUS 50 mcg/hr (12/01/24 0700)   insulin  1.4 Units/hr (12/01/24 0700)   lactated ringers      lactated ringers  20 mL/hr at 12/01/24 0700   milrinone  0.25 mcg/kg/min (12/01/24 0700)   nitroGLYCERIN  Stopped (11/30/24 1615)   norepinephrine  (LEVOPHED ) Adult infusion 10 mcg/min (12/01/24 0700)   sodium bicarbonate  25 mEq (Impella PURGE) in dextrose  5 % 1000 mL bag     vasopressin  Stopped (11/30/24 1615)    PRN Medications: sodium chloride , sodium chloride , sodium chloride , albumin  human, albuterol , dextrose , fentaNYL , midazolam  PF, morphine  injection, ondansetron  (ZOFRAN ) IV, mouth rinse, oxyCODONE , sodium chloride  flush, traMADol     Patient Profile  43 y.o. male w/ h/o heavy alcohol use (at least 5 drinks per evening) and h/o asthma admitted w/ acute systolic heart failure. Strong family history of cardiac disease: mother and maternal grandmother had heart failure and father with CAD.    HFrEF. Severe valvular diease.    Assessment/Plan  1.  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  Valvular Disease  - severe MR posteriorly directed, mod TR. Likely functional. HF optimization per above - severe AI.-->CMR - Severe AR/MR  -S/P AVR/MVR with Impella 5.5 - Impella P7 Flow 3.5. LDH stable. CO 3.8 CI 2.1  -Currently on Epi 4 + Norpei 32mcg+ Milrinone  0.25 mcg.  -Wean pressors as able.  -CVP trending up. Started 80 mg IV lasix  now. Assess response.   2. Acute Systolic Heart Failure w/ Biventricular Dysfunction  -  Echo EF 34-50%, GIIIDD (restrictive), RV mod reduced, IVC dilated - HS trop not c/w ACS but EKG w/ anteroseptal Qs  -  CMRI Severe AR/MR. LVEF 35%  - Cath- normal cors, RA5, PA 32/17 (24), PVR 2.57 Papi 3. CO 4.9 CI 3.8 - As above start to diurese today.  -  Restart GDMT once off pressors.   3. Anemia Expected Blood Loss -Getting unit of blood today. CT/MT output slowing.   4. Transaminates - Hepatitis panel negative. RUQ US  unremarkable  - suspect 2/2 CHF/hepatic congestion + chronic ETOH use    5.  ETOH Abuse - heavy drinker, at least 5 drinks per evening - may be contributing to CM, reduction of intake imperative  - No evidence of withdrawal   6  Hypomagnesemia - follow; goal >2    7 . DMII  Hgb A1C 6.1 On SSI.    Length of Stay: 8  Marietta Sikkema, NP  12/01/2024, 7:13 AM  Advanced Heart Failure Team Pager [phone] (M-F; 7a - 5p)  Please contact [facility] for night-coverage after hours (5p -7a ) and weekends on [email or web address]

## 2024-12-01 NOTE — Progress Notes (Addendum)
 NAME:  Alejandro Lopez, MRN:  [id number], DOB:  1981-04-18, LOS: 8 ADMISSION DATE:  11/22/2024, CONSULTATION DATE:  12/8 REFERRING MD:  Lucas, CHIEF COMPLAINT:  post cardiac surgery critical care services    History of Present Illness:  43 year old male patient with history of hypertension, alcohol and tobacco abuse, presented to the emergency room on 11/30 with approximately 1 month history of progressive dyspnea accompanied by lower extremity swelling extending up to the level of his scrotum and abdomen. Diagnostic evaluation by echocardiogram showed left ventricular ejection fraction 35 to 40% with grade 3 diastolic dysfunction this was further complicated by severe mitral valve regurgitation and aortic insufficiency because of this he was transferred to [facility] for further evaluation. He underwent cardiac catheterization on 12/4: Right heart hemodynamic parameters showed baseline right atrial pressure 5 mmHg, PA pressure 32/17, pulmonary capillary wedge pressure at 14 estimated Fick 4.9 L/min with cardiac index Fick calculated at 2.59 His PAPi was 3 Left heart cath was negative for coronary artery disease Went to OR 12/8 for MVR and AVR w/ impella insertion. PCCM asked to assist w/ post op care   OR course EBL: 1735 Received  Products: cryo 92ml, FFP 401 Also received DDAVP , 2200 crystalloid, 250ml albumin   Cell saver 1125 Pump time 4hrs Clamp time 2hrs 50 min  Events: multiple defibrillations intra-op  Intra-op ECHO EF estimated 40%  Pertinent  Medical History  Tobacco abuse, alcohol abuse, hypertension  Significant Hospital Events: Including procedures, antibiotic start and stop dates in addition to other pertinent events   11/30 admitted/. ECHO 35 to 40% with grade 3 diastolic dysfunction this was further complicated by severe mitral valve regurgitation and aortic insufficiency 12/4 left and right heart cath 12/8 AVR and MVR w/ bioprosthetic valves. Arrived on icu w/  Impella 5.5 MCS at flow 4 lpm and P 7. Received 2 more PLTs, 2 FFP and 1 cryo in first 6 hrs post op for cont blood loss/oozing. Required NE, epi and milrinone . Good flow on IMPELLA but minimal pulsatility  at P7. Placed on Amio gtt for VT 12/9 received 1 unit PRBC over night for hgb down to 7.5, hgb 8 getting second unit of blood. Still on P7 3.5 lPM. SBT initiated.   Interim History / Subjective:  Appears comfortable Bleeding appears to have slowed down Excellent effort w/ no inc'd WOB on SBT NE at 10mcg/min, epi at 4 mcg/min mil still at 0.25 mcg/kg/min Cut dex down to 0.7   Objective    Blood pressure 96/67, pulse 86, temperature 98.6 F (37 C), resp. rate 20, height 5' 10 (1.778 m), weight 74 kg, SpO2 100%. PAP: (13-71)/(-2-49) 20/16 CVP:  [4 mmHg-34 mmHg] 12 mmHg CO:  [2.8 L/min-4.4 L/min] 4.2 L/min CI:  [1.5 L/min/m2-2.4 L/min/m2] 2.3 L/min/m2  Vent Mode: PSV;CPAP FiO2 (%):  [45 %-50 %] 45 % Set Rate:  [16 bmp-18 bmp] 17 bmp Vt Set:  [580 mL] 580 mL PEEP:  [5 cmH20-10 cmH20] 5 cmH20 Pressure Support:  [5 cmH20-14 cmH20] 5 cmH20 Plateau Pressure:  [18 cmH20-22 cmH20] 22 cmH20   Intake/Output Summary (Last 24 hours) at 12/01/2024 0747 Last data filed at 12/01/2024 0700 Gross per 24 hour  Intake 9446.6 ml  Output 7610 ml  Net 1836.6 ml   Filed Weights   11/30/24 0516 11/30/24 0646 12/01/24 0500  Weight: 70.1 kg 69.9 kg 74 kg    Examination: General resting in bed. Now on PSV and appears comfortable HENT NCAT left PAC w/ some  oozing from last evening. Orally intubated Pulm clear. Currently on SBT. PS 5/ PEEP 5 his Vts are in 700s. No inc'd WOB. CT w/ no air leak. Output since admit to ICU totals 1330. PCXR clear ett good position. PAC a little deep on this image but has since been retracted. No PTX. Additional lines and tubes look good Card RRR the mid sternal dressing has staining from the evening but appears to have stopped bleeding. There is residual staining on the  mediastinal tubes and the Pacemaker wires dressing look. Back up pacer in place. Impella dressing oozing but appears to have decreased. Currently at P7 current Flow 3.6 LPM w/ improved current CI 2.5 was 1.8 last evening. Current NE requirements 10 mcg/min, epi still at 4 mcg/min, Milrinone  0.25 mcg/kg/min  Abd soft + bowel sounds  Ext warm and dry brisk CR no sig edema Neuro awake, equal strength. Follows commands.     Resolved problem list   Assessment and Plan   Severe MR and AI w/ associated acute systolic heart failure and  cardiogenic shock. Now s/p bioprosthetic MVR and AVR 12/8 w/ Impella 5.5 MSC plan Cont to wean NE for MAP > 65 w/ goal MAP 65-80  Holding Epi at 4 mcg/min and milrinone  at 0.25 mcg/kg/min (defer inotropic changes to HF) Currently Impella at 3.6 LPM, P7 still w/ minimal pulsatility IV lasix  x 1 today per surgical team request  Ensure temporary epicardial pacing wires in place with back up rate for symptomatic bradycardia or AVB Close observation for evidence of bleeding, including chest tube output. Looks like it has slowed down.  Tight glycemic control Multi-modal analgesia  SCDs immediate post-op; deferring to surgical team given recent bleeding/difficulty w/ coagulopathy   Initiate post-operative AC once [facility] w/ cardiac surgeon, VKA therapy (to be dosed by pharm and surgical team) and once started to continue for first 3-6 months, followed by lifelong aspirin  (75-100 mg daily)-->holding currently d/t post operative oozing  Complete prophylactic antibiotics    Mobilize post extubation   Post op Vent management  Stopped fent gtt, dex dec'd 0.7 Passed SBT Plan Dc dex infusion De-escalate fent dosing Extubate Wean FIO2 for sats > 92% Early mobilization  Encourage IS   Expected acute blood loss anemia w/ post-operative coagulopathy and thrombocytopenia  EBL 1735, received 1125 cell saver  He received 2 FFP, cryo and DDAVP  in OR and after arrival to ICU  another 2 FFP, 2 PLTs, 1 cryo, 2gm Ca and 2 units of blood for on-going post operative oozing. Appears as though this has slowed down overnight  Hgb this am 8. PLTs 116 Plan Getting 1 more unit of blood Cont to watch CT output  AM CBC Baseline hgb: 16.3; most recent istat hgb 11.2; most recent PLT 107  Plan Trend CBC  Elevated LFTs from congested HF Had been improving Plan AM CMP  Pre-diabetes Plan Initiate SSI after insulin  gtt completed Goal 140-180   H/o tobacco abuse and asthma  Plan Cessation  Add duoneb   Critical care time:    I personally  spent 43 minutes  on this patient which included: review of medical records, nursing notes, progress notes, evaluation, interpretation of lab data and diagnostic studies, taking independent history, performing exam, documenting plan, ordering diagnostics and interventions for the following critical care issues: Circulatory shock, Acute respiratory failure, Acute toxic and or metabolic encephalopathy with the following interventions which included: titration of ventilatory support, titration of hemodynamic drips to desired MAP

## 2024-12-01 NOTE — Progress Notes (Signed)
  Echocardiogram 2D Echocardiogram has been performed.  Koleen KANDICE Popper, RDCS 12/01/2024, 12:58 AM

## 2024-12-01 NOTE — Progress Notes (Signed)
 EVENING ROUNDS NOTE :     [address] [employer] [address]             [phone]               1 Day Post-Op Procedure(s) (LRB): REPLACEMENT, AORTIC VALVE, OPEN WITH INSPIRIS RESILIA AORTIC VALVE 23mm (N/A) REPLACEMENT, MITRAL VALVE WITH MITRIS RESILIA MITRAL VALVE 27mm (N/A) INSERTION, CARDIAC ASSIST DEVICE, IMPELLA (N/A) ECHOCARDIOGRAM, TRANSESOPHAGEAL, INTRAOPERATIVE (N/A)   Total Length of Stay:  LOS: 8 days  Events:   No events Extubated Good flows    BP 111/62   Pulse 92   Temp 99.3 F (37.4 C)   Resp (!) 23   Ht 5' 10 (1.778 m)   Wt 77.7 kg   SpO2 100%   BMI 24.58 kg/m   PAP: (-7-99)/(-11-94) 28/24 CVP:  [0 mmHg-40 mmHg] 4 mmHg CO:  [2.8 L/min-5.2 L/min] 4 L/min CI:  [1.5 L/min/m2-2.8 L/min/m2] 2.1 L/min/m2  Vent Mode: PSV;CPAP FiO2 (%):  [45 %-50 %] 45 % Set Rate:  [16 bmp-18 bmp] 17 bmp Vt Set:  [580 mL] 580 mL PEEP:  [5 cmH20-10 cmH20] 5 cmH20 Pressure Support:  [5 cmH20-14 cmH20] 5 cmH20 Plateau Pressure:  [18 cmH20-22 cmH20] 22 cmH20   sodium chloride  Stopped (12/01/24 1436)   sodium chloride      sodium chloride  Stopped (12/01/24 0604)   sodium chloride      sodium chloride      sodium chloride      albumin  human Stopped (12/01/24 0008)   amiodarone  30 mg/hr (12/01/24 1500)    ceFAZolin  (ANCEF ) IV Stopped (12/01/24 1338)   epinephrine  4 mcg/min (12/01/24 1500)   insulin  2 Units/hr (12/01/24 1500)   lactated ringers      lactated ringers  20 mL/hr at 12/01/24 1500   milrinone  0.25 mcg/kg/min (12/01/24 1500)   nitroGLYCERIN  Stopped (11/30/24 1615)   norepinephrine  (LEVOPHED ) Adult infusion 3 mcg/min (12/01/24 1500)   sodium bicarbonate  25 mEq (Impella PURGE) in dextrose  5 % 1000 mL bag     vasopressin  0.03 Units/min (12/01/24 1500)    I/O last 3 completed shifts: In: 9696.6 [I.V.:4427.1; Blood:3240.5; Other:208.8; IV Piggyback:1820.2] Out: 8310 [Urine:5130; Drains:35; Blood:1735; Chest Tube:1410]      Latest Ref  Rng & Units 12/01/2024    9:35 AM 12/01/2024    8:04 AM 12/01/2024    4:58 AM  CBC  WBC 4.0 - 10.5 K/uL   9.8   Hemoglobin 13.0 - 17.0 g/dL 8.8  7.1  8.0   Hematocrit 39.0 - 52.0 % 26.0  21.0  22.9   Platelets 150 - 400 K/uL   116        Latest Ref Rng & Units 12/01/2024    9:35 AM 12/01/2024    8:04 AM 12/01/2024    4:58 AM  BMP  Glucose 70 - 99 mg/dL   880   BUN 6 - 20 mg/dL   11   Creatinine 9.38 - 1.24 mg/dL   9.15   Sodium 864 - 854 mmol/L 138  140  135   Potassium 3.5 - 5.1 mmol/L 4.3  4.2  4.3   Chloride 98 - 111 mmol/L   109   CO2 22 - 32 mmol/L   16   Calcium  8.9 - 10.3 mg/dL   7.5     ABG    Component Value Date/Time   PHART 7.436 12/01/2024 0935   PCO2ART 36.3 12/01/2024 0935   PO2ART 144 (H) 12/01/2024 0935  HCO3 24.4 12/01/2024 0935   TCO2 25 12/01/2024 0935   ACIDBASEDEF 3.0 (H) 12/01/2024 0454   O2SAT 99 12/01/2024 0935       Linnie Rayas, MD 12/01/2024 3:56 PM

## 2024-12-01 NOTE — TOC Initial Note (Signed)
 Transition of Care [facility]) - Initial/Assessment Note    Patient Details  Name: Alejandro Lopez MRN: [id number] Date of Birth: [birth year]  Transition of Care [facility]) CM/SW Contact:    Justina Delcia Czar, RN Phone Number: [phone] 12/01/2024, 5:03 PM  Clinical Narrative:                 Spoke to pt at bedside. States wife takes him to appt. He does not drive at this time.   Pt was independent pta. States he works full-time prior to admissions.    Will schedule hospital follow up appt with PCP.   Expected Discharge Plan: Home/Self Care Barriers to Discharge: Continued Medical Work up   Patient Goals and CMS Choice Patient states their goals for this hospitalization and ongoing recovery are:: return home   Choice offered to / list presented to : Patient [place] ownership interest in [facility].provided to::  (n/a)    Expected Discharge Plan and Services In-house Referral: Clinical Social Work Discharge Planning Services: CM Consult   Living arrangements for the past 2 months: Single Family Home                      Prior Living Arrangements/Services Living arrangements for the past 2 months: Single Family Home Lives with:: Spouse, Minor Children Patient language and need for interpreter reviewed:: Yes Do you feel safe going back to the place where you live?: Yes      Need for Family Participation in Patient Care: Yes (Comment) Care giver support system in place?: Yes (comment)   Criminal Activity/Legal Involvement Pertinent to Current Situation/Hospitalization: No - Comment as needed  Activities of Daily Living   ADL Screening (condition at time of admission) Independently performs ADLs?: Yes (appropriate for developmental age) Is the patient deaf or have difficulty hearing?: No Does the patient have difficulty seeing, even when wearing glasses/contacts?: No Does the patient have difficulty concentrating, remembering, or making decisions?:  No  Permission Sought/Granted Permission sought to share information with : Case Manager, Family Supports, PCP Permission granted to share information with : Yes, Verbal Permission Granted  Share Information with NAME: Dena Twyman  Permission granted to share info w AGENCY: Home Health, DME, PCP  Permission granted to share info w Relationship: wife  Permission granted to share info w Contact Information: [phone]  Emotional Assessment Appearance:: Appears stated age Attitude/Demeanor/Rapport: Engaged Affect (typically observed): Accepting Orientation: : Oriented to Self, Oriented to Place, Oriented to  Time, Oriented to Situation Alcohol / Substance Use: Alcohol Use Psych Involvement: No (comment)  Admission diagnosis:  CHF (congestive heart failure) (HCC) [I50.9] Acute congestive heart failure, unspecified heart failure type (HCC) [I50.9] Acute CHF (HCC) [I50.9] Severe aortic regurgitation [I35.1] Patient Active Problem List   Diagnosis Date Noted   Severe aortic regurgitation 11/30/2024   HFrEF (heart failure with reduced ejection fraction) (HCC) 11/24/2024   Nonrheumatic mitral valve regurgitation 11/24/2024   Nonrheumatic aortic valve insufficiency 11/24/2024   Acute CHF (HCC) 11/23/2024   Acute HFrEF (heart failure with reduced ejection fraction) (HCC) 11/23/2024   CHF (congestive heart failure) (HCC) 11/22/2024   Essential hypertension 11/22/2024   Tobacco use 11/22/2024   Alcohol use disorder, moderate, dependence (HCC) 11/22/2024   Abnormal LFTs 11/22/2024   Low back pain 09/23/2014   PCP:  Patient, No Pcp Per Pharmacy:   [id number] - [place], [place] - [address] [place] [address] Phone: [phone] Fax: [phone]     Social Drivers of Health (SDOH) Social History: SDOH Screenings   Food Insecurity: No Food Insecurity (11/22/2024)  Housing: Low Risk  (11/22/2024)   Transportation Needs: No Transportation Needs (11/22/2024)  Utilities: Not At Risk (11/22/2024)  Social Connections: Unknown (05/06/2022)   Received from [facility]  Tobacco Use: High Risk (11/30/2024)   SDOH Interventions:     Readmission Risk Interventions     No data to display

## 2024-12-02 ENCOUNTER — Inpatient Hospital Stay (HOSPITAL_COMMUNITY)

## 2024-12-02 DIAGNOSIS — Z95811 Presence of heart assist device: Secondary | ICD-10-CM | POA: Diagnosis not present

## 2024-12-02 DIAGNOSIS — I5021 Acute systolic (congestive) heart failure: Secondary | ICD-10-CM | POA: Diagnosis not present

## 2024-12-02 LAB — POCT I-STAT 7, (LYTES, BLD GAS, ICA,H+H)
Acid-base deficit: 1 mmol/L (ref 0.0–2.0)
Bicarbonate: 24.2 mmol/L (ref 20.0–28.0)
Calcium, Ion: 1.21 mmol/L (ref 1.15–1.40)
HCT: 26 % — ABNORMAL LOW (ref 39.0–52.0)
Hemoglobin: 8.8 g/dL — ABNORMAL LOW (ref 13.0–17.0)
O2 Saturation: 94 %
Patient temperature: 36.8
Potassium: 4.9 mmol/L (ref 3.5–5.1)
Sodium: 132 mmol/L — ABNORMAL LOW (ref 135–145)
TCO2: 25 mmol/L (ref 22–32)
pCO2 arterial: 40.6 mmHg (ref 32–48)
pH, Arterial: 7.383 (ref 7.35–7.45)
pO2, Arterial: 71 mmHg — ABNORMAL LOW (ref 83–108)

## 2024-12-02 LAB — COOXEMETRY PANEL
Carboxyhemoglobin: 2.2 % — ABNORMAL HIGH (ref 0.5–1.5)
Carboxyhemoglobin: 1.2 % (ref 0.5–1.5)
Methemoglobin: <0.7 % (ref 0.0–1.5)
Methemoglobin: <0.7 % (ref 0.0–1.5)
O2 Saturation: 60.7 %
O2 Saturation: 51.4 %
Total hemoglobin: 7.8 g/dL — ABNORMAL LOW (ref 12.0–16.0)
Total hemoglobin: 9.2 g/dL — ABNORMAL LOW (ref 12.0–16.0)

## 2024-12-02 LAB — COMPREHENSIVE METABOLIC PANEL WITH GFR
ALT: 28 U/L (ref 0–44)
AST: 68 U/L — ABNORMAL HIGH (ref 15–41)
Albumin: 2.4 g/dL — ABNORMAL LOW (ref 3.5–5.0)
Alkaline Phosphatase: 49 U/L (ref 38–126)
Anion gap: 8 (ref 5–15)
BUN: 12 mg/dL (ref 6–20)
CO2: 24 mmol/L (ref 22–32)
Calcium: 8.3 mg/dL — ABNORMAL LOW (ref 8.9–10.3)
Chloride: 101 mmol/L (ref 98–111)
Creatinine, Ser: 0.92 mg/dL (ref 0.61–1.24)
GFR, Estimated: >60 mL/min (ref >60)
Glucose, Bld: 169 mg/dL — ABNORMAL HIGH (ref 70–99)
Potassium: 4.8 mmol/L (ref 3.5–5.1)
Sodium: 133 mmol/L — ABNORMAL LOW (ref 135–145)
Total Bilirubin: 1.1 mg/dL (ref 0.0–1.2)
Total Protein: 4.9 g/dL — ABNORMAL LOW (ref 6.5–8.1)

## 2024-12-02 LAB — LACTATE DEHYDROGENASE: LDH: 344 U/L — ABNORMAL HIGH (ref 105–235)

## 2024-12-02 LAB — CBC
HCT: 21.8 % — ABNORMAL LOW (ref 39.0–52.0)
Hemoglobin: 7.8 g/dL — ABNORMAL LOW (ref 13.0–17.0)
MCH: 32.1 pg (ref 26.0–34.0)
MCHC: 35.8 g/dL (ref 30.0–36.0)
MCV: 89.7 fL (ref 80.0–100.0)
Platelets: 89 K/uL — ABNORMAL LOW (ref 150–400)
RBC: 2.43 MIL/uL — ABNORMAL LOW (ref 4.22–5.81)
RDW: 16.1 % — ABNORMAL HIGH (ref 11.5–15.5)
WBC: 15.6 K/uL — ABNORMAL HIGH (ref 4.0–10.5)
nRBC: 0 % (ref 0.0–0.2)

## 2024-12-02 LAB — GLUCOSE, CAPILLARY
Glucose-Capillary: 164 mg/dL — ABNORMAL HIGH (ref 70–99)
Glucose-Capillary: 155 mg/dL — ABNORMAL HIGH (ref 70–99)
Glucose-Capillary: 144 mg/dL — ABNORMAL HIGH (ref 70–99)
Glucose-Capillary: 117 mg/dL — ABNORMAL HIGH (ref 70–99)
Glucose-Capillary: 125 mg/dL — ABNORMAL HIGH (ref 70–99)
Glucose-Capillary: 108 mg/dL — ABNORMAL HIGH (ref 70–99)
Glucose-Capillary: 121 mg/dL — ABNORMAL HIGH (ref 70–99)

## 2024-12-02 LAB — ECHOCARDIOGRAM LIMITED
Est EF: <20
Height: 70 in
Weight: 2694.9 [oz_av]

## 2024-12-02 LAB — HEMOGLOBIN AND HEMATOCRIT, BLOOD
HCT: 24.9 % — ABNORMAL LOW (ref 39.0–52.0)
Hemoglobin: 8.9 g/dL — ABNORMAL LOW (ref 13.0–17.0)

## 2024-12-02 LAB — PREPARE RBC (CROSSMATCH)

## 2024-12-02 LAB — SURGICAL PATHOLOGY

## 2024-12-02 MED ORDER — HYDRALAZINE HCL 20 MG/ML IJ SOLN
10.0000 mg | Freq: Three times a day (TID) | INTRAMUSCULAR | Status: DC | PRN
  Administered 2024-12-12 – 2024-12-18 (×6): 10 mg via INTRAVENOUS
  Filled 2024-12-02 (×8): qty 1

## 2024-12-02 MED ORDER — FUROSEMIDE 10 MG/ML IJ SOLN
40.0000 mg | Freq: Two times a day (BID) | INTRAMUSCULAR | Status: AC
  Administered 2024-12-02 (×2): 40 mg via INTRAVENOUS
  Filled 2024-12-02 (×2): qty 4

## 2024-12-02 MED ORDER — SODIUM CHLORIDE 0.9% IV SOLUTION: Freq: Once | INTRAVENOUS | Status: AC

## 2024-12-02 MED ORDER — SENNA 8.6 MG PO TABS
1.0000 | ORAL_TABLET | Freq: Every day | ORAL | Status: DC
  Administered 2024-12-02 – 2024-12-03 (×2): 8.6 mg via ORAL
  Filled 2024-12-02 (×2): qty 1

## 2024-12-02 MED ORDER — DOBUTAMINE-DEXTROSE 4-5 MG/ML-% IV SOLN
INTRAVENOUS | Status: AC
  Filled 2024-12-02: qty 250

## 2024-12-02 MED ORDER — FUROSEMIDE 10 MG/ML IJ SOLN: 40.0000 mg | Freq: Once | INTRAMUSCULAR | Status: DC

## 2024-12-02 MED ORDER — ENSURE PLUS HIGH PROTEIN PO LIQD
237.0000 mL | Freq: Two times a day (BID) | ORAL | Status: DC
  Administered 2024-12-03: 237 mL via ORAL

## 2024-12-02 MED ORDER — ORAL CARE MOUTH RINSE: 15.0000 mL | OROMUCOSAL | Status: DC | PRN

## 2024-12-02 MED ORDER — LACTATED RINGERS IV SOLN: INTRAVENOUS | Status: AC

## 2024-12-02 MED ORDER — HYDRALAZINE HCL 20 MG/ML IJ SOLN: 10.0000 mg | Freq: Three times a day (TID) | INTRAMUSCULAR | Status: DC | PRN

## 2024-12-02 MED ORDER — FUROSEMIDE 10 MG/ML IJ SOLN: 80.0000 mg | Freq: Once | INTRAMUSCULAR | Status: DC

## 2024-12-02 MED ORDER — DIGOXIN 125 MCG PO TABS
0.1250 mg | ORAL_TABLET | Freq: Every day | ORAL | Status: DC
  Administered 2024-12-02 – 2024-12-03 (×2): 0.125 mg via ORAL
  Filled 2024-12-02 (×2): qty 1

## 2024-12-02 MED FILL — Lidocaine HCl Local Soln Prefilled Syringe 100 MG/5ML (2%): INTRAMUSCULAR | Qty: 5 | Status: AC

## 2024-12-02 MED FILL — Heparin Sodium (Porcine) Inj 1000 Unit/ML: INTRAMUSCULAR | Qty: 20 | Status: AC

## 2024-12-02 MED FILL — Lidocaine HCl Local Preservative Free (PF) Inj 2%: INTRAMUSCULAR | Qty: 14 | Status: AC

## 2024-12-02 MED FILL — Sodium Bicarbonate IV Soln 8.4%: INTRAVENOUS | Qty: 100 | Status: AC

## 2024-12-02 MED FILL — Mannitol IV Soln 20%: INTRAVENOUS | Qty: 500 | Status: AC

## 2024-12-02 MED FILL — Electrolyte-R (PH 7.4) Solution: INTRAVENOUS | Qty: 7000 | Status: AC

## 2024-12-02 MED FILL — Heparin Sodium (Porcine) Inj 1000 Unit/ML: Qty: 1000 | Status: AC

## 2024-12-02 MED FILL — Sodium Chloride IV Soln 0.9%: INTRAVENOUS | Qty: 1000 | Status: AC

## 2024-12-02 MED FILL — Potassium Chloride Inj 2 mEq/ML: INTRAVENOUS | Qty: 40 | Status: AC

## 2024-12-02 NOTE — Progress Notes (Signed)
 Echocardiogram 2D Echocardiogram has been performed.  Alejandro Lopez 12/02/2024, 11:50 AM

## 2024-12-02 NOTE — Progress Notes (Addendum)
 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  12/9 VT/VF ? vagal episode. Extubated  Flow (Liters/min): 3.5 Liters/min Performance Level: P7 Recent Labs    11/30/24 1626 12/01/24 0458 12/02/24 0522  LDH 307* 290* 344*   CO-OX 61%  PAP: (-7-99)/(-11-94) 59/21 CVP:  [0 mmHg-40 mmHg] 10 mmHg CO:  [3.9 L/min-6.3 L/min] 5.8 L/min CI:  [2.1 L/min/m2-3.4 L/min/m2] 3.1 L/min/m2  Milrinone  0.25 mcg and Epi.   NSVT when coughing.   Denies SOB    Objective:   Weight Range: 76.4 kg Body mass index is 24.17 kg/m.   Vital Signs:   Temp:  [97.7 F (36.5 C)-100 F (37.8 C)] 97.7 F (36.5 C) (12/10 0748) Pulse Rate:  [79-104] 102 (12/10 0748) Resp:  [0-34] 20 (12/10 0748) BP: (93-124)/(62-97) 120/97 (12/10 0748) SpO2:  [79 %-100 %] 96 % (12/10 0748) Arterial Line BP: (59-143)/(46-88) 124/84 (12/10 0748) FiO2 (%):  [28 %] 28 % (12/10 0722) Weight:  [76.4 kg-77.7 kg] 76.4 kg (12/10 0500) Last BM Date : 11/29/24  Weight change: Filed Weights   12/01/24 0500 12/01/24 1000 12/02/24 0500  Weight: 74 kg 77.7 kg 76.4 kg    Intake/Output:   Intake/Output Summary (Last 24 hours) at 12/02/2024 0804 Last data filed at 12/02/2024 0700 Gross per 24 hour  Intake 2493.17 ml  Output 2560 ml  Net -66.83 ml     CVP 7-8 Physical Exam   General:   No resp difficulty Neck: no JVD.  Cor: Regular rate & rhythm. Impella 5.5  Lungs: clear Abdomen: soft, nontender, nondistended.  Extremities: no  edema Neuro: alert & oriented x3  Telemetry  ST 100s NSVT when coughing.   EKG     Labs    CBC Recent Labs    12/01/24 1457 12/02/24 0522 12/02/24 0526  WBC 13.5* 15.6*  --   HGB 8.9* 7.8* 8.8*  HCT 24.7* 21.8* 26.0*  MCV 90.1 89.7  --   PLT 98* 89*  --    Basic Metabolic Panel Recent Labs    87/90/74 0458 12/01/24 0804 12/01/24 1457  12/02/24 0522 12/02/24 0526  NA 135   < > 135 133* 132*  K 4.3   < > 4.4 4.8 4.9  CL 109  --  102 101  --   CO2 16*  --  26 24  --   GLUCOSE 119*  --  108* 169*  --   BUN 11  --  13 12  --   CREATININE 0.84  --  0.85 0.92  --   CALCIUM  7.5*  --  8.0* 8.3*  --   MG 2.2  --  1.7  --   --    < > = values in this interval not displayed.   Liver Function Tests Recent Labs    11/29/24 1629 12/02/24 0522  AST 63* 68*  ALT 66* 28  ALKPHOS 127* 49  BILITOT 0.9 1.1  PROT 6.7 4.9*  ALBUMIN  3.1* 2.4*   No results for input(s): LIPASE, AMYLASE in the last 72 hours. Cardiac Enzymes No results for input(s): CKTOTAL, CKMB, CKMBINDEX, TROPONINI in the last 72 hours.  BNP: BNP (last 3 results) No results for input(s): BNP in the last 8760 hours.  ProBNP (last 3 results) Recent Labs    11/22/24 1958  PROBNP 3,354.0*     D-Dimer No results for input(s): DDIMER in  the last 72 hours. Hemoglobin A1C No results for input(s): HGBA1C in the last 72 hours. Fasting Lipid Panel No results for input(s): CHOL, HDL, LDLCALC, TRIG, CHOLHDL, LDLDIRECT in the last 72 hours. Thyroid Function Tests No results for input(s): TSH, T4TOTAL, T3FREE, THYROIDAB in the last 72 hours.  Invalid input(s): FREET3  Other results:   Imaging    DG CHEST PORT 1 VIEW Result Date: 12/01/2024 EXAM: 1 VIEW(S) XRAY OF THE CHEST 12/01/2024 08:17:00 AM COMPARISON: 12/01/2024 CLINICAL HISTORY: Hx of CABG FINDINGS: LINES, TUBES AND DEVICES: Endotracheal tube in place with tip 4 cm above the carina. Enteric tube in place with tip and side port below the hemidiaphragm. Left internal jugular Swan-Ganz catheter in place with tip overlying the expected region of the right pulmonary artery. Impella device stable in position. Mediastinal drains and epicardial pacing wires noted. LUNGS AND PLEURA: Retrocardiac airspace opacity, likely atelectasis. No pleural effusion. No pneumothorax.  HEART AND MEDIASTINUM: Stable cardiomegaly. Postsurgical changes of aortic valve replacement. BONES AND SOFT TISSUES: Intact median sternotomy wires. No acute osseous abnormality. IMPRESSION: 1. Retrocardiac airspace opacity, likely atelectasis. 2. Stable cardiomegaly and postsurgical changes of aortic valve replacement. Electronically signed by: Evalene Coho MD 12/01/2024 08:37 AM EST RP Workstation: [id number]     Medications:     Scheduled Medications:  sodium chloride    Intravenous Once   sodium chloride    Intravenous Once   acetaminophen   1,000 mg Oral Q6H   Or   acetaminophen  (TYLENOL ) oral liquid 160 mg/5 mL  1,000 mg Per Tube Q6H   arformoterol   15 mcg Nebulization BID   bisacodyl   10 mg Oral Daily   Or   bisacodyl   10 mg Rectal Daily   Chlorhexidine  Gluconate Cloth  6 each Topical Daily   docusate sodium   200 mg Oral Daily   folic acid   1 mg Oral Daily   insulin  aspart  0-24 Units Subcutaneous Q4H   multivitamin with minerals  1 tablet Oral Daily   mupirocin  ointment  1 Application Nasal BID   mouth rinse  15 mL Mouth Rinse Q2H   pantoprazole   40 mg Oral Daily   revefenacin   175 mcg Nebulization Daily   senna  1 tablet Oral Daily   sodium chloride  flush  3 mL Intravenous Q12H   thiamine   100 mg Oral Daily    Infusions:  sodium chloride      amiodarone  30 mg/hr (12/02/24 0700)   epinephrine  4 mcg/min (12/02/24 0700)   milrinone  0.25 mcg/kg/min (12/02/24 0700)   nitroGLYCERIN  Stopped (11/30/24 1615)   norepinephrine  (LEVOPHED ) Adult infusion Stopped (12/01/24 1746)   sodium bicarbonate  25 mEq (Impella PURGE) in dextrose  5 % 1000 mL bag     vasopressin  Stopped (12/02/24 0441)    PRN Medications: sodium chloride , sodium chloride , albuterol , morphine  injection, ondansetron  (ZOFRAN ) IV, mouth rinse, oxyCODONE , sodium chloride  flush, traMADol     Patient Profile  43 y.o. male w/ h/o heavy alcohol use (at least 5 drinks per evening) and h/o asthma admitted w/ acute  systolic heart failure. Strong family history of cardiac disease: mother and maternal grandmother had heart failure and father with CAD.    HFrEF. Severe valvular diease.    Assessment/Plan  1.  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  Valvular Disease  - severe MR posteriorly directed, mod TR. Likely functional.  - severe AI.-->CMR - Severe AR/MR  -S/P AVR/MVR with Impella 5.5 - Impella P7 Flow 3.5. LDH stable. CO-OX  61%  -Currently on Epi 4 +  Milrinone  0.25 mcg. Wean Epi slowlly.  -CVP 7.-8 . Hold lasix .   2. Acute Systolic Heart Failure w/ Biventricular Dysfunction  -  Echo EF 34-50%, GIIIDD (restrictive), RV mod reduced, IVC dilated - HS trop not c/w ACS but EKG w/ anteroseptal Qs  - CMRI Severe AR/MR. LVEF 35%  - Cath- normal cors, RA5, PA 32/17 (24), PVR 2.57 Papi 3. CO 4.9 CI 3.8 - CVP 7-.8. Hold Lasix .  -Impella @ P7 Flow 3.5  -As above  slowly wean Epi.  -  Holding GDMT once off pressors.   3. Anemia Expected Blood Loss -12/9  1URPBC. CT/MT minimal.  Hgb 8.9>7.8  -Give 1 more unit of blood.   4. Transaminates - Hepatitis panel negative. RUQ US  unremarkable  - suspect 2/2 CHF/hepatic congestion + chronic ETOH use    5.  ETOH Abuse - heavy drinker, at least 5 drinks per evening - may be contributing to CM, reduction of intake imperative  - No evidence of withdrawal   6  Hypomagnesemia - follow; goal >2    7 . DMII  Hgb A1C 6.1 On SSI.   OOB today. Wean Epi. Consider removing swan and place PICC Obtain limited echo.    Length of Stay: 9  Amy Clegg, NP  12/02/2024, 8:04 AM  Advanced Heart Failure Team Pager [phone] (M-F; 7a - 5p)  Please contact [facility] for night-coverage after hours (5p -7a ) and weekends on [email or web address]  Patient seen with NP, I formulated the plan and agree with the above note.   Patient is on epinephrine  4 + milrinone  0.25 + Impella P7 (flow 3.7 L/min) with stable MAP and CI 2.7-3.1 by continuous cardiac output Swan, co-ox  61%. CVP 12 on my read.    He feels good, ready to walk.   I reviewed echo, EF < 20% with severe LVH (new, ?inflammatory), moderately decreased RV function, stable bioprosthetic MV and AoV.   General: NAD Neck: JVP 10 cm, no thyromegaly or thyroid nodule.  Lungs: Clear to auscultation bilaterally with normal respiratory effort. CV: Nondisplaced PMI.  Heart regular S1/S2, no S3/S4, no murmur.  No peripheral edema.    Abdomen: Soft, nontender, no hepatosplenomegaly, no distention.  Skin: Intact without lesions or rashes.  Neurologic: Alert and oriented x 3.  Psych: Normal affect. Extremities: No clubbing or cyanosis.  HEENT: Normal.   Wean epinephrine  today, continue current milrinone  and will leave Impella at P7.  Check Impella position by echo today. LDH 290 => 344, plts 98 => 89.  Not on heparin  yet, just HCO3 purge. Can add digoxin  0.125 daily.   CVP 12, will be getting 1 unit PRBCs today with hgb 7.8.  Will give Lasix  40 mg IV bid today and follow response. Creatinine normal.   On amiodarone  gtt with NSVT episodes.   Ambulate today.    CRITICAL CARE Performed by: Ezra Shuck  Total critical care time: 45 minutes  Critical care time was exclusive of separately billable procedures and treating other patients.  Critical care was necessary to treat or prevent imminent or life-threatening deterioration.  Critical care was time spent personally by me on the following activities: development of treatment plan with patient and/or surrogate as well as nursing, discussions with consultants, evaluation of patient's response to treatment, examination of patient, obtaining history from patient or surrogate, ordering and performing treatments and interventions, ordering and review of laboratory studies, ordering and review of radiographic studies, pulse oximetry and re-evaluation of patient's condition.  Chanah Tidmore [employer]  12/02/2024 8:45 AM

## 2024-12-02 NOTE — Progress Notes (Signed)
 NAME:  Alejandro Lopez, MRN:  [id number], DOB:  1981-04-18, LOS: 9 ADMISSION DATE:  11/22/2024, CONSULTATION DATE:  12/8 REFERRING MD:  Lucas, CHIEF COMPLAINT:  post cardiac surgery critical care services    History of Present Illness:  43 year old male patient with history of hypertension, alcohol and tobacco abuse, presented to the emergency room on 11/30 with approximately 1 month history of progressive dyspnea accompanied by lower extremity swelling extending up to the level of his scrotum and abdomen. Diagnostic evaluation by echocardiogram showed left ventricular ejection fraction 35 to 40% with grade 3 diastolic dysfunction this was further complicated by severe mitral valve regurgitation and aortic insufficiency because of this he was transferred to [facility] for further evaluation. He underwent cardiac catheterization on 12/4: Right heart hemodynamic parameters showed baseline right atrial pressure 5 mmHg, PA pressure 32/17, pulmonary capillary wedge pressure at 14 estimated Fick 4.9 L/min with cardiac index Fick calculated at 2.59 His PAPi was 3 Left heart cath was negative for coronary artery disease Went to OR 12/8 for MVR and AVR w/ impella insertion. PCCM asked to assist w/ post op care   OR course EBL: 1735 Received  Products: cryo 92ml, FFP 401 Also received DDAVP , 2200 crystalloid, 250ml albumin   Cell saver 1125 Pump time 4hrs Clamp time 2hrs 50 min  Events: multiple defibrillations intra-op  Intra-op ECHO EF estimated 40%  Pertinent  Medical History  Tobacco abuse, alcohol abuse, hypertension  Significant Hospital Events: Including procedures, antibiotic start and stop dates in addition to other pertinent events   11/30 admitted/. ECHO 35 to 40% with grade 3 diastolic dysfunction this was further complicated by severe mitral valve regurgitation and aortic insufficiency 12/4 left and right heart cath 12/8 AVR and MVR w/ bioprosthetic valves. Arrived on icu w/  Impella 5.5 MCS at flow 4 lpm and P 7. Received 2 more PLTs, 2 FFP and 1 cryo in first 6 hrs post op for cont blood loss/oozing. Required NE, epi and milrinone . Good flow on IMPELLA but minimal pulsatility  at P7. Placed on Amio gtt for VT 12/9 received 1 unit PRBC over night for hgb down to 7.5, hgb 8 getting second unit of blood. Still on P7 3.5 lPM. Extubated.  Interim History / Subjective:  Still on clear liquids, no nausea. Pain 6-7/10. Using IS. Denies complaints, no BM since surgery.   Objective    Blood pressure (!) 124/97, pulse (!) 101, temperature 97.9 F (36.6 C), resp. rate (!) 23, height 5' 10 (1.778 m), weight 76.4 kg, SpO2 95%. PAP: (-7-99)/(-11-94) 53/20 CVP:  [0 mmHg-40 mmHg] 9 mmHg CO:  [3.8 L/min-6.3 L/min] 5.7 L/min CI:  [2 L/min/m2-3.4 L/min/m2] 3.1 L/min/m2  Vent Mode: PSV;CPAP FiO2 (%):  [45 %] 45 % PEEP:  [5 cmH20] 5 cmH20 Pressure Support:  [5 cmH20] 5 cmH20   Intake/Output Summary (Last 24 hours) at 12/02/2024 0657 Last data filed at 12/02/2024 0600 Gross per 24 hour  Intake 2722.18 ml  Output 2670 ml  Net 52.18 ml   Filed Weights   12/01/24 0500 12/01/24 1000 12/02/24 0500  Weight: 74 kg 77.7 kg 76.4 kg    Examination: General middle aged man lying in bed in NAD HENT  [place]/AT, eyes anicteric Pulm breathing comfortably on [place], decreased basilar breath sounds Cardiac: Impella P7, RRR. Minimal chest tube output Abd soft, NT Ext  no LE edema, R hand warm, well perfused Neuro awake, alert, moving extremities and answering questions appropriately.  CT output 80cc, none  overnight  Coox 61% Na+ 133 BUN 12 Cr 0.92 WBC 15.6 H/H 7.8/21.8 Platelets 89   Resolved problem list   Assessment and Plan   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 -con't impella with bicarb purge -inotropes per AHF; con't  milrinone  0.62mcg, epi 4mcg. Off NE now. -GDMT limited with ongoing pressor requirements; may be able to start spiro soon -progress diet and moblity -can walk with swam and axillary impella -chest tubes per TCTS -hopefully can d/c foley as he mobilizes -daily aspirin - restart when approved by surgery   VT -con't amiodarone  while on inotropes -monitor electrolytes and replete as needed -tele  Hyperglycemia; h/o pre-DM. A1c 6.1 -SSI PRN -goal BG <180   H/o tobacco abuse H/o asthma vs COPD -quitting smoking recommended -con't nebs; can switch back to DPI once inspiratory effort is more adequate -IS, pulmonary hygiene   ETOH abuse -recommend cessation -vitamins   Anemia, thrombocytopenia -transfuse for Hb <7 or hemodynamically significant bleeding  Constipation -mobility -senna added today  Hyponatremia -avoid hypotonic fluids  DVT: will discuss with TCTS starting lovenox  at some point    Critical care time:      This patient is critically ill with multiple organ system failure which requires frequent high complexity decision making, assessment, support, evaluation, and titration of therapies. This was completed through the application of advanced monitoring technologies and extensive interpretation of multiple databases. During this encounter critical care time was devoted to patient care services described in this note for 41 minutes.  Leita SHAUNNA Gaskins, DO 12/02/24 8:13 AM [facility]  For contact information, see Amion. If no response to pager, please call PCCM consult pager. After hours, 7PM- 7AM, please call Elink.

## 2024-12-02 NOTE — Progress Notes (Signed)
 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 Slept well. Pain under good control  Hemodynamics stable on milrinone  0.25, epi 4. Impella P7. SVO2 62, Co-ox 61, arterial sats 94%, Hgb 7.8.  Objective: Vital signs in last 24 hours: Temp:  [97.9 F (36.6 C)-100 F (37.8 C)] 97.9 F (36.6 C) (12/10 0630) Pulse Rate:  [79-195] 101 (12/10 0630) Cardiac Rhythm: Normal sinus rhythm (12/10 0400) Resp:  [0-34] 23 (12/10 0630) BP: (93-124)/(62-97) 124/97 (12/10 0400) SpO2:  [79 %-100 %] 95 % (12/10 0630) Arterial Line BP: (59-143)/(46-88) 120/81 (12/10 0630) FiO2 (%):  [45 %] 45 % (12/09 0719) Weight:  [76.4 kg-77.7 kg] 76.4 kg (12/10 0500)  Hemodynamic parameters for last 24 hours: PAP: (-7-99)/(-11-94) 53/20 CVP:  [0 mmHg-40 mmHg] 9 mmHg CO:  [3.8 L/min-6.3 L/min] 5.7 L/min CI:  [2 L/min/m2-3.4 L/min/m2] 3.1 L/min/m2  Intake/Output from previous day: 12/09 0701 - 12/10 0700 In: 2557.6 [I.V.:1619.3; Blood:308; IV Piggyback:350] Out: 2610 [Urine:2515; Drains:15; Chest Tube:80] Intake/Output this shift: Total I/O In: 950.9 [I.V.:576.5; Other:124.4; IV Piggyback:250] Out: 555 [Urine:550; Drains:5]  General appearance: alert and cooperative Neurologic: intact Heart: regular rate and rhythm Lungs: clear to auscultation bilaterally Extremities: edema mild Wound: dressings dry  Lab Results: Recent Labs    12/01/24 1457 12/02/24 0522 12/02/24 0526  WBC 13.5* 15.6*  --   HGB 8.9* 7.8* 8.8*  HCT 24.7* 21.8* 26.0*  PLT 98* 89*  --    BMET:  Recent Labs    12/01/24 1457 12/02/24 0522 12/02/24 0526  NA 135 133* 132*  K 4.4 4.8 4.9  CL 102 101  --   CO2 26 24  --   GLUCOSE 108* 169*  --   BUN 13 12  --    CREATININE 0.85 0.92  --   CALCIUM  8.0* 8.3*  --     PT/INR:  Recent Labs    11/30/24 1950  LABPROT 17.2*  INR 1.3*   ABG    Component Value Date/Time   PHART 7.383 12/02/2024 0526   HCO3 24.2 12/02/2024 0526   TCO2 25 12/02/2024 0526   ACIDBASEDEF 1.0 12/02/2024 0526   O2SAT 60.7 12/02/2024 0534   CBG (last 3)  Recent Labs    12/01/24 1930 12/01/24 2316 12/02/24 0304  GLUCAP 144* 164* 155*    Assessment/Plan: S/P Procedure(s) (LRB): REPLACEMENT, AORTIC VALVE, OPEN WITH INSPIRIS RESILIA AORTIC VALVE 23mm (N/A) REPLACEMENT, 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 Discuss Impella wean and timing of removal with AHF team.  DC chest tubes and Blake drain from Impella wound.  Acute blood loss anemia: will probably improve with diuresis.  He is 14 lbs over preop and off NE and vaso. He needs good diuresis today per AHF team.  OOB to chair, IS, ambulate if possible.    LOS: 9 days    Alejandro Lopez 12/02/2024

## 2024-12-02 NOTE — Progress Notes (Signed)
 TCTS Evening Rounds:  Hemodynamically stable on milrinone  0.25, Impella P7. Epi almost off.  CI 2.9, SVO2 58.   Has been up in chair all day and feels well.  Good UO with lasix .

## 2024-12-02 NOTE — Plan of Care (Signed)
  Problem: Education: Goal: Knowledge of General Education information will improve Description: Including pain rating scale, medication(s)/side effects and non-pharmacologic comfort measures Outcome: Progressing   Problem: Health Behavior/Discharge Planning: Goal: Ability to manage health-related needs will improve Outcome: Progressing   Problem: Clinical Measurements: Goal: Ability to maintain clinical measurements within normal limits will improve Outcome: Progressing Goal: Diagnostic test results will improve Outcome: Progressing Goal: Respiratory complications will improve Outcome: Progressing Goal: Cardiovascular complication will be avoided Outcome: Progressing   Problem: Activity: Goal: Risk for activity intolerance will decrease Outcome: Progressing   Problem: Nutrition: Goal: Adequate nutrition will be maintained Outcome: Progressing   Problem: Coping: Goal: Level of anxiety will decrease Outcome: Progressing   Problem: Elimination: Goal: Will not experience complications related to urinary retention Outcome: Progressing   Problem: Pain Managment: Goal: General experience of comfort will improve and/or be controlled Outcome: Progressing   Problem: Safety: Goal: Ability to remain free from injury will improve Outcome: Progressing   Problem: Skin Integrity: Goal: Risk for impaired skin integrity will decrease Outcome: Progressing

## 2024-12-02 NOTE — Progress Notes (Addendum)
 ScVO2 is 51.4 down from 60.7 this am after epinephrine  is stopped.  Retarted Epinephrine  @ 1mcg/min If SBP > 150 can use hydralazine  prn.  Tamela Stakes, MD  Attending Physician, Critical Care Medicine [facility] See Amion for pager If no response to pager, please call [phone] until 7pm After 7pm, Please call E-link [phone]

## 2024-12-03 ENCOUNTER — Inpatient Hospital Stay (HOSPITAL_COMMUNITY)

## 2024-12-03 ENCOUNTER — Telehealth (HOSPITAL_COMMUNITY): Payer: Self-pay

## 2024-12-03 ENCOUNTER — Other Ambulatory Visit (HOSPITAL_COMMUNITY): Payer: Self-pay

## 2024-12-03 ENCOUNTER — Encounter (HOSPITAL_COMMUNITY)
Admission: EM | Disposition: A | Discharge status: Rehabilitation Facility | Payer: Self-pay | Source: Home / Self Care | Attending: Surgery

## 2024-12-03 DIAGNOSIS — I5021 Acute systolic (congestive) heart failure: Secondary | ICD-10-CM | POA: Diagnosis not present

## 2024-12-03 HISTORY — PX: LAPAROTOMY: SHX154

## 2024-12-03 LAB — CBC
HCT: 21.5 % — ABNORMAL LOW (ref 39.0–52.0)
HCT: 22.7 % — ABNORMAL LOW (ref 39.0–52.0)
HCT: 15.9 % — ABNORMAL LOW (ref 39.0–52.0)
Hemoglobin: 7.5 g/dL — ABNORMAL LOW (ref 13.0–17.0)
Hemoglobin: 8 g/dL — ABNORMAL LOW (ref 13.0–17.0)
Hemoglobin: 5.5 g/dL — CL (ref 13.0–17.0)
MCH: 31.4 pg (ref 26.0–34.0)
MCH: 31.4 pg (ref 26.0–34.0)
MCH: 30.6 pg (ref 26.0–34.0)
MCHC: 34.9 g/dL (ref 30.0–36.0)
MCHC: 35.2 g/dL (ref 30.0–36.0)
MCHC: 34.6 g/dL (ref 30.0–36.0)
MCV: 90 fL (ref 80.0–100.0)
MCV: 89 fL (ref 80.0–100.0)
MCV: 88.3 fL (ref 80.0–100.0)
Platelets: 82 K/uL — ABNORMAL LOW (ref 150–400)
Platelets: 95 K/uL — ABNORMAL LOW (ref 150–400)
Platelets: 98 K/uL — ABNORMAL LOW (ref 150–400)
RBC: 2.39 MIL/uL — ABNORMAL LOW (ref 4.22–5.81)
RBC: 2.55 MIL/uL — ABNORMAL LOW (ref 4.22–5.81)
RBC: 1.8 MIL/uL — ABNORMAL LOW (ref 4.22–5.81)
RDW: 15.2 % (ref 11.5–15.5)
RDW: 14.8 % (ref 11.5–15.5)
RDW: 14.9 % (ref 11.5–15.5)
WBC: 14.6 K/uL — ABNORMAL HIGH (ref 4.0–10.5)
WBC: 13.8 K/uL — ABNORMAL HIGH (ref 4.0–10.5)
WBC: 10.1 K/uL (ref 4.0–10.5)
nRBC: 0 % (ref 0.0–0.2)
nRBC: 0.2 % (ref 0.0–0.2)
nRBC: 0 % (ref 0.0–0.2)

## 2024-12-03 LAB — POCT I-STAT 7, (LYTES, BLD GAS, ICA,H+H)
Acid-Base Excess: 3 mmol/L — ABNORMAL HIGH (ref 0.0–2.0)
Acid-Base Excess: 1 mmol/L (ref 0.0–2.0)
Acid-Base Excess: 1 mmol/L (ref 0.0–2.0)
Bicarbonate: 26.8 mmol/L (ref 20.0–28.0)
Bicarbonate: 24.8 mmol/L (ref 20.0–28.0)
Bicarbonate: 24.7 mmol/L (ref 20.0–28.0)
Calcium, Ion: 1.21 mmol/L (ref 1.15–1.40)
Calcium, Ion: 1.16 mmol/L (ref 1.15–1.40)
Calcium, Ion: 1.12 mmol/L — ABNORMAL LOW (ref 1.15–1.40)
HCT: 27 % — ABNORMAL LOW (ref 39.0–52.0)
HCT: 15 % — ABNORMAL LOW (ref 39.0–52.0)
HCT: 20 % — ABNORMAL LOW (ref 39.0–52.0)
Hemoglobin: 9.2 g/dL — ABNORMAL LOW (ref 13.0–17.0)
Hemoglobin: 5.1 g/dL — CL (ref 13.0–17.0)
Hemoglobin: 6.8 g/dL — CL (ref 13.0–17.0)
O2 Saturation: 98 %
O2 Saturation: 97 %
O2 Saturation: 97 %
Patient temperature: 36.6
Patient temperature: 97.7
Patient temperature: 35.6
Potassium: 4.1 mmol/L (ref 3.5–5.1)
Potassium: 4 mmol/L (ref 3.5–5.1)
Potassium: 4.2 mmol/L (ref 3.5–5.1)
Sodium: 131 mmol/L — ABNORMAL LOW (ref 135–145)
Sodium: 131 mmol/L — ABNORMAL LOW (ref 135–145)
Sodium: 132 mmol/L — ABNORMAL LOW (ref 135–145)
TCO2: 28 mmol/L (ref 22–32)
TCO2: 26 mmol/L (ref 22–32)
TCO2: 26 mmol/L (ref 22–32)
pCO2 arterial: 36.4 mmHg (ref 32–48)
pCO2 arterial: 36.1 mmHg (ref 32–48)
pCO2 arterial: 34.3 mmHg (ref 32–48)
pH, Arterial: 7.474 — ABNORMAL HIGH (ref 7.35–7.45)
pH, Arterial: 7.443 (ref 7.35–7.45)
pH, Arterial: 7.459 — ABNORMAL HIGH (ref 7.35–7.45)
pO2, Arterial: 92 mmHg (ref 83–108)
pO2, Arterial: 84 mmHg (ref 83–108)
pO2, Arterial: 82 mmHg — ABNORMAL LOW (ref 83–108)

## 2024-12-03 LAB — TYPE AND SCREEN
ABO/RH(D): O POS
Antibody Screen: NEGATIVE
Unit division: 0
Unit division: 0
Unit division: 0

## 2024-12-03 LAB — GLUCOSE, CAPILLARY
Glucose-Capillary: 99 mg/dL (ref 70–99)
Glucose-Capillary: 95 mg/dL (ref 70–99)
Glucose-Capillary: 141 mg/dL — ABNORMAL HIGH (ref 70–99)
Glucose-Capillary: 129 mg/dL — ABNORMAL HIGH (ref 70–99)
Glucose-Capillary: 99 mg/dL (ref 70–99)

## 2024-12-03 LAB — BPAM RBC
Blood Product Expiration Date: 202512312359
Blood Product Expiration Date: 202512312359
Blood Product Expiration Date: 202601042359
ISSUE DATE / TIME: 202512090050
ISSUE DATE / TIME: 202512090840
ISSUE DATE / TIME: 202512100849
Unit Type and Rh: 5100
Unit Type and Rh: 5100
Unit Type and Rh: 5100

## 2024-12-03 LAB — BASIC METABOLIC PANEL WITH GFR
Anion gap: 6 (ref 5–15)
Anion gap: 9 (ref 5–15)
Anion gap: 5 (ref 5–15)
BUN: 16 mg/dL (ref 6–20)
BUN: 19 mg/dL (ref 6–20)
BUN: 21 mg/dL — ABNORMAL HIGH (ref 6–20)
CO2: 27 mmol/L (ref 22–32)
CO2: 25 mmol/L (ref 22–32)
CO2: 26 mmol/L (ref 22–32)
Calcium: 8.1 mg/dL — ABNORMAL LOW (ref 8.9–10.3)
Calcium: 8.3 mg/dL — ABNORMAL LOW (ref 8.9–10.3)
Calcium: 7.3 mg/dL — ABNORMAL LOW (ref 8.9–10.3)
Chloride: 96 mmol/L — ABNORMAL LOW (ref 98–111)
Chloride: 97 mmol/L — ABNORMAL LOW (ref 98–111)
Chloride: 97 mmol/L — ABNORMAL LOW (ref 98–111)
Creatinine, Ser: 0.69 mg/dL (ref 0.61–1.24)
Creatinine, Ser: 1.04 mg/dL (ref 0.61–1.24)
Creatinine, Ser: 1.19 mg/dL (ref 0.61–1.24)
GFR, Estimated: >60 mL/min (ref >60)
GFR, Estimated: >60 mL/min (ref >60)
GFR, Estimated: >60 mL/min (ref >60)
Glucose, Bld: 92 mg/dL (ref 70–99)
Glucose, Bld: 145 mg/dL — ABNORMAL HIGH (ref 70–99)
Glucose, Bld: 158 mg/dL — ABNORMAL HIGH (ref 70–99)
Potassium: 4 mmol/L (ref 3.5–5.1)
Potassium: 4.1 mmol/L (ref 3.5–5.1)
Potassium: 3.8 mmol/L (ref 3.5–5.1)
Sodium: 129 mmol/L — ABNORMAL LOW (ref 135–145)
Sodium: 131 mmol/L — ABNORMAL LOW (ref 135–145)
Sodium: 128 mmol/L — ABNORMAL LOW (ref 135–145)

## 2024-12-03 LAB — BODY FLUID CELL COUNT WITH DIFFERENTIAL
Eos, Fluid: 0 %
Lymphs, Fluid: 13 %
Monocyte-Macrophage-Serous Fluid: 9 % — ABNORMAL LOW (ref 50–90)
Neutrophil Count, Fluid: 78 % — ABNORMAL HIGH (ref 0–25)
Total Nucleated Cell Count, Fluid: 830 uL (ref 0–1000)

## 2024-12-03 LAB — PREPARE RBC (CROSSMATCH)

## 2024-12-03 LAB — COOXEMETRY PANEL
Carboxyhemoglobin: 1.3 % (ref 0.5–1.5)
Carboxyhemoglobin: 2.1 % — ABNORMAL HIGH (ref 0.5–1.5)
Methemoglobin: 1.1 % (ref 0.0–1.5)
Methemoglobin: 0.7 % (ref 0.0–1.5)
O2 Saturation: 71.5 %
O2 Saturation: 65.5 %
Total hemoglobin: <6.9 g/dL — CL (ref 12.0–16.0)
Total hemoglobin: 8.2 g/dL — ABNORMAL LOW (ref 12.0–16.0)

## 2024-12-03 LAB — CG4 I-STAT (LACTIC ACID): Lactic Acid, Venous: 3.3 mmol/L (ref 0.5–1.9)

## 2024-12-03 LAB — MAGNESIUM: Magnesium: 1.7 mg/dL (ref 1.7–2.4)

## 2024-12-03 LAB — HEMOGLOBIN AND HEMATOCRIT, BLOOD
HCT: 15.7 % — ABNORMAL LOW (ref 39.0–52.0)
Hemoglobin: 5.4 g/dL — CL (ref 13.0–17.0)

## 2024-12-03 LAB — LACTATE DEHYDROGENASE: LDH: 341 U/L — ABNORMAL HIGH (ref 105–235)

## 2024-12-03 MED ORDER — VASOPRESSIN 20 UNITS/100 ML INFUSION FOR SHOCK
INTRAVENOUS | Status: AC
  Administered 2024-12-03: 0.04 [IU]/min via INTRAVENOUS
  Filled 2024-12-03: qty 100

## 2024-12-03 MED ORDER — LIDOCAINE HCL (PF) 2 % IJ SOLN
20.0000 mL | Freq: Once | INTRAMUSCULAR | Status: AC
  Administered 2024-12-03: 20 mL
  Filled 2024-12-03: qty 20

## 2024-12-03 MED ORDER — FENTANYL CITRATE (PF) 50 MCG/ML IJ SOSY
PREFILLED_SYRINGE | INTRAMUSCULAR | Status: AC
  Administered 2024-12-03: 75 ug via INTRAVENOUS
  Filled 2024-12-03: qty 2

## 2024-12-03 MED ORDER — FUROSEMIDE 10 MG/ML IJ SOLN
80.0000 mg | Freq: Two times a day (BID) | INTRAMUSCULAR | Status: AC
  Administered 2024-12-03: 80 mg via INTRAVENOUS
  Filled 2024-12-03: qty 8

## 2024-12-03 MED ORDER — LIDOCAINE-EPINEPHRINE (PF) 2 %-1:200000 IJ SOLN
INTRAMUSCULAR | Status: AC
  Administered 2024-12-03: 10 mL
  Filled 2024-12-03: qty 20

## 2024-12-03 MED ORDER — FLUTICASONE FUROATE-VILANTEROL 100-25 MCG/ACT IN AEPB
1.0000 | INHALATION_SPRAY | Freq: Every day | RESPIRATORY_TRACT | Status: DC
  Administered 2024-12-03: 1 via RESPIRATORY_TRACT
  Filled 2024-12-03: qty 28

## 2024-12-03 MED ORDER — FUROSEMIDE 10 MG/ML IJ SOLN
40.0000 mg | Freq: Two times a day (BID) | INTRAMUSCULAR | Status: DC
  Filled 2024-12-03: qty 4

## 2024-12-03 MED ORDER — CALCIUM GLUCONATE-NACL 1-0.675 GM/50ML-% IV SOLN
INTRAVENOUS | Status: AC
  Filled 2024-12-03: qty 50

## 2024-12-03 MED ORDER — FENTANYL CITRATE (PF) 250 MCG/5ML IJ SOLN
INTRAMUSCULAR | Status: AC
  Filled 2024-12-03: qty 5

## 2024-12-03 MED ORDER — ALBUMIN HUMAN 25 % IV SOLN
INTRAVENOUS | Status: AC
  Administered 2024-12-03: 25 g
  Filled 2024-12-03: qty 100

## 2024-12-03 MED ORDER — IOHEXOL 350 MG/ML SOLN
100.0000 mL | Freq: Once | INTRAVENOUS | Status: AC | PRN
  Administered 2024-12-03: 100 mL via INTRAVENOUS

## 2024-12-03 MED ORDER — SODIUM CHLORIDE 0.9 % IN NEBU: 3.0000 mL | INHALATION_SOLUTION | Freq: Three times a day (TID) | RESPIRATORY_TRACT | Status: DC | PRN

## 2024-12-03 MED ORDER — VASOPRESSIN 20 UNITS/100 ML INFUSION FOR SHOCK
0.0400 [IU]/min | INTRAVENOUS | Status: DC
  Administered 2024-12-04 – 2024-12-06 (×7): 0.04 [IU]/min via INTRAVENOUS
  Filled 2024-12-03: qty 200
  Filled 2024-12-03 (×8): qty 100

## 2024-12-03 MED ORDER — SODIUM CHLORIDE 0.9% IV SOLUTION: Freq: Once | INTRAVENOUS | Status: DC

## 2024-12-03 MED ORDER — PIPERACILLIN-TAZOBACTAM 3.375 G IVPB
3.3750 g | Freq: Three times a day (TID) | INTRAVENOUS | Status: DC
  Administered 2024-12-04 – 2024-12-12 (×26): 3.375 g via INTRAVENOUS
  Filled 2024-12-03 (×25): qty 50

## 2024-12-03 MED ORDER — ALBUMIN HUMAN 5 % IV SOLN
INTRAVENOUS | Status: AC
  Filled 2024-12-03: qty 250

## 2024-12-03 MED ORDER — PROPOFOL 10 MG/ML IV BOLUS
INTRAVENOUS | Status: AC
  Filled 2024-12-03: qty 20

## 2024-12-03 MED ORDER — ALBUMIN HUMAN 5 % IV SOLN
12.5000 g | Freq: Once | INTRAVENOUS | Status: AC
  Administered 2024-12-03: 12.5 g via INTRAVENOUS

## 2024-12-03 MED ORDER — CALCIUM GLUCONATE-NACL 1-0.675 GM/50ML-% IV SOLN
1.0000 g | Freq: Once | INTRAVENOUS | Status: AC
  Administered 2024-12-03: 1000 mg via INTRAVENOUS

## 2024-12-03 MED ORDER — FENTANYL CITRATE (PF) 50 MCG/ML IJ SOSY: 75.0000 ug | PREFILLED_SYRINGE | Freq: Once | INTRAMUSCULAR | Status: AC

## 2024-12-03 MED ORDER — INSULIN ASPART 100 UNIT/ML IJ SOLN
0.0000 [IU] | Freq: Three times a day (TID) | INTRAMUSCULAR | Status: DC
  Administered 2024-12-03 – 2024-12-04 (×2): 2 [IU] via SUBCUTANEOUS
  Filled 2024-12-03 (×2): qty 2

## 2024-12-03 MED ORDER — VANCOMYCIN HCL IN DEXTROSE 1-5 GM/200ML-% IV SOLN: 1000.0000 mg | Freq: Two times a day (BID) | INTRAVENOUS | Status: DC

## 2024-12-03 MED ORDER — MIDAZOLAM HCL 2 MG/2ML IJ SOLN
INTRAMUSCULAR | Status: AC
  Filled 2024-12-03: qty 2

## 2024-12-03 MED ORDER — IOHEXOL 300 MG/ML  SOLN
100.0000 mL | Freq: Once | INTRAMUSCULAR | Status: AC | PRN
  Administered 2024-12-04: 40 mL via INTRA_ARTERIAL

## 2024-12-03 MED ORDER — MAGNESIUM SULFATE 4 GM/100ML IV SOLN: 4.0000 g | Freq: Once | INTRAVENOUS | Status: DC

## 2024-12-03 MED ORDER — LIDOCAINE HCL 1 % IJ SOLN
INTRAMUSCULAR | Status: AC
  Filled 2024-12-03: qty 20

## 2024-12-03 MED ORDER — SORBITOL 70 % SOLN
60.0000 mL | Freq: Once | Status: AC
  Administered 2024-12-03: 60 mL via ORAL
  Filled 2024-12-03 (×2): qty 60

## 2024-12-03 MED ORDER — LIDOCAINE 5 % EX PTCH
1.0000 | MEDICATED_PATCH | CUTANEOUS | Status: DC
  Administered 2024-12-03 – 2024-12-26 (×20): 1 via TRANSDERMAL
  Filled 2024-12-03 (×12): qty 1

## 2024-12-03 MED ORDER — POTASSIUM CHLORIDE CRYS ER 20 MEQ PO TBCR
20.0000 meq | EXTENDED_RELEASE_TABLET | Freq: Three times a day (TID) | ORAL | Status: AC
  Administered 2024-12-03 (×2): 20 meq via ORAL
  Filled 2024-12-03 (×2): qty 1

## 2024-12-03 MED ORDER — HEPARIN SODIUM (PORCINE) 5000 UNIT/ML IJ SOLN
5000.0000 [IU] | Freq: Three times a day (TID) | INTRAMUSCULAR | Status: DC
  Administered 2024-12-03 (×2): 5000 [IU] via SUBCUTANEOUS
  Filled 2024-12-03 (×2): qty 1

## 2024-12-03 MED ORDER — MAGNESIUM SULFATE 4 GM/100ML IV SOLN
4.0000 g | Freq: Once | INTRAVENOUS | Status: AC
  Administered 2024-12-03: 4 g via INTRAVENOUS
  Filled 2024-12-03: qty 100

## 2024-12-03 MED ORDER — TEMAZEPAM 7.5 MG PO CAPS: 15.0000 mg | ORAL_CAPSULE | Freq: Every evening | ORAL | Status: DC | PRN

## 2024-12-03 MED ORDER — ALBUMIN HUMAN 5 % IV SOLN: 25.0000 g | Freq: Once | INTRAVENOUS | Status: AC

## 2024-12-03 MED ORDER — IOHEXOL 300 MG/ML  SOLN
150.0000 mL | Freq: Once | INTRAMUSCULAR | Status: AC | PRN
  Administered 2024-12-04: 25 mL via INTRA_ARTERIAL

## 2024-12-03 MED ORDER — NOREPINEPHRINE 4 MG/250ML-% IV SOLN
2.0000 ug/min | INTRAVENOUS | Status: DC
  Administered 2024-12-03: 2 ug/min via INTRAVENOUS
  Administered 2024-12-04: 32 ug/min via INTRAVENOUS
  Administered 2024-12-04: 34 ug/min via INTRAVENOUS
  Administered 2024-12-04 (×2): 32 ug/min via INTRAVENOUS
  Administered 2024-12-04: 34 ug/min via INTRAVENOUS
  Administered 2024-12-04: 18 ug/min via INTRAVENOUS
  Administered 2024-12-04: 34 ug/min via INTRAVENOUS
  Filled 2024-12-03 (×2): qty 250
  Filled 2024-12-03: qty 500
  Filled 2024-12-03: qty 250
  Filled 2024-12-03: qty 750
  Filled 2024-12-03: qty 250

## 2024-12-03 MED ORDER — FENTANYL CITRATE (PF) 100 MCG/2ML IJ SOLN
INTRAMUSCULAR | Status: AC
  Filled 2024-12-03: qty 2

## 2024-12-03 MED ORDER — FENTANYL CITRATE (PF) 50 MCG/ML IJ SOSY
25.0000 ug | PREFILLED_SYRINGE | INTRAMUSCULAR | Status: DC | PRN
  Administered 2024-12-09 (×3): 25 ug via INTRAVENOUS

## 2024-12-03 MED ORDER — LIDOCAINE-EPINEPHRINE 1 %-1:100000 IJ SOLN: 20.0000 mL | Freq: Once | INTRAMUSCULAR | Status: DC

## 2024-12-03 MED ORDER — VANCOMYCIN HCL 1500 MG/300ML IV SOLN
1500.0000 mg | Freq: Once | INTRAVENOUS | Status: DC
  Filled 2024-12-03: qty 300

## 2024-12-03 MED ORDER — CALCIUM GLUCONATE-NACL 1-0.675 GM/50ML-% IV SOLN
1.0000 g | Freq: Once | INTRAVENOUS | Status: AC
  Administered 2024-12-03: 1000 mg via INTRAVENOUS
  Filled 2024-12-03: qty 50

## 2024-12-03 MED ADMIN — Lidocaine HCl Local Inj 1%: 10 mL | INTRADERMAL | NDC 00409427616

## 2024-12-03 MED ADMIN — Epinephrine PF Inj 1 MG/ML: 1 ug/min | INTRAVENOUS | NDC 99999070065

## 2024-12-03 MED FILL — Epinephrine PF Inj 1 MG/ML: 0.5000 ug/min | INTRAVENOUS | Qty: 250 | Status: AC

## 2024-12-03 MED FILL — Epinephrine PF Inj 1 MG/ML: 0.5000 ug/min | INTRAVENOUS | Qty: 250 | Status: CN

## 2024-12-03 MED FILL — Lidocaine HCl Local Inj 1%: 20.0000 mL | INTRAMUSCULAR | Qty: 20 | Status: AC

## 2024-12-03 NOTE — Progress Notes (Deleted)
 Pharmacy Antibiotic Note  Alejandro Lopez is a 43 y.o. male admitted on 11/22/2024 with sepsis.  Pharmacy has been consulted for Vancomycin  dosing.  Plan: Vancomycin  1500 mg IV x 1 dose then 1000 mg IV every 12 hours.  Goal trough 10-20 mcg/mL. Estimated trough 12.0, estimated AUC 450.  Height: 5' 10 (177.8 cm) Weight: 75.9 kg (167 lb 5.3 oz) IBW/kg (Calculated) : 73  Temp (24hrs), Avg:97.3 F (36.3 C), Min:95.2 F (35.1 C), Max:98.2 F (36.8 C)  Recent Labs  Lab 12/01/24 0458 12/01/24 1457 12/02/24 0522 12/03/24 0506 12/03/24 1516 12/03/24 1807  WBC 9.8 13.5* 15.6* 14.6* 13.8*  --   CREATININE 0.84 0.85 0.92 0.69  --  1.04    Estimated Creatinine Clearance: 92.6 mL/min (by C-G formula based on SCr of 1.04 mg/dL).    Allergies[1]  Antimicrobials this admission: Vancomycin  12/11 >>  Zosyn 12/11 >>   Microbiology results: 12/2 BCx: No growth 12/11 Bcx: pending 12/7 MRSA PCR: MRSA negative, staph aureus positive  Thank you for allowing pharmacy to be a part of this patients care.  Larraine Brazier, PharmD Clinical Pharmacist 12/03/2024  10:41 PM **Pharmacist phone directory can now be found on [email or web address] ([email or web address]).  Listed under [facility].      [1] No Known Allergies

## 2024-12-03 NOTE — Consult Note (Addendum)
 Reason for Consult:SB and R colon pneumatosis Referring Physician: Ras Kollman is an 43 y.o. male.  HPI: 43yo M S/P AVR, MCR, Impella placement by Dr. Lucas 12/8. He has been progressing postoperatively.  He had a right pleural effusion for which he underwent thoracentesis today by CCM.  He continued to have an effusion with hemoglobin drop to 5.4 and a right sided chest tube was placed by TCTS.  He has had 2.5 L of output.  He also developed some abdominal pain.  His colon looked dilated on x-ray and he underwent CT scan of the chest abdomen pelvis to both evaluate his right chest as well as his abdomen.  The chest showed active bleeding from his intercostal.  IR has been consulted for that.  His abdomen showed pneumatosis and pelvic small bowel and right colon with portal venous gas.  I was asked to see him in consultation for concern for bowel ischemia.  He does report some abdominal pain.  He also complains of back pain.  He reports he has not had any abdominal surgeries.  He is undergoing resuscitation.  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   Procedure: REPLACEMENT, MITRAL VALVE WITH MITRIS RESILIA MITRAL VALVE 27mm;  Surgeon: Lucas Dorise POUR, MD;  Location: [facility] OR;  Service: Open Heart Surgery;  Laterality: N/A;   PLACEMENT OF IMPELLA LEFT VENTRICULAR ASSIST DEVICE N/A 11/30/2024   Procedure: INSERTION, CARDIAC  ASSIST DEVICE, IMPELLA;  Surgeon: Lucas Dorise POUR, MD;  Location: [facility] OR;  Service: Open Heart Surgery;  Laterality: N/A;  IMPELLA 5.5 INSERTION   RIGHT HEART CATH AND CORONARY ANGIOGRAPHY N/A 11/27/2024   Procedure: RIGHT HEART CATH AND CORONARY ANGIOGRAPHY;  Surgeon: Zenaida Morene PARAS, MD;  Location: [facility] INVASIVE CV LAB;  Service: Cardiovascular;  Laterality: N/A;   TRANSESOPHAGEAL ECHOCARDIOGRAM (CATH LAB) N/A 11/27/2024   Procedure: TRANSESOPHAGEAL ECHOCARDIOGRAM;  Surgeon: Zenaida Morene PARAS, MD;  Location: [facility] INVASIVE CV LAB;  Service: Cardiovascular;  Laterality: N/A;    Family History  Problem Relation Age of Onset   Stroke Mother    Hypertension Mother    Diabetes Mother     Social History:  reports that he has been smoking cigarettes. He has never used smokeless tobacco. He reports current alcohol use of about 4.0 standard drinks of alcohol per week. He reports current drug use. Drug: Marijuana.  Allergies: Allergies[1]  Medications: I have reviewed the patient's current medications.  Results for orders placed or performed during the hospital encounter of 11/22/24 (from the past 48 hours)  Glucose, capillary     Status: Abnormal   Collection Time: 12/01/24 11:16 PM  Result Value Ref Range   Glucose-Capillary 164 (H) 70 - 99 mg/dL    Comment: Glucose reference range applies only to samples taken after fasting for at least 8 hours.  Glucose, capillary     Status: Abnormal   Collection Time: 12/02/24  3:04 AM  Result  Value Ref Range   Glucose-Capillary 155 (H) 70 - 99 mg/dL    Comment: Glucose reference range applies only to samples taken after fasting for at least 8 hours.  CBC     Status: Abnormal   Collection Time: 12/02/24  5:22 AM  Result Value Ref Range   WBC 15.6 (H) 4.0 - 10.5 K/uL   RBC 2.43 (L) 4.22 - 5.81 MIL/uL   Hemoglobin 7.8 (L) 13.0 - 17.0 g/dL   HCT 78.1 (L) 60.9 - 47.9 %   MCV 89.7 80.0 - 100.0 fL   MCH 32.1 26.0 - 34.0 pg   MCHC 35.8 30.0 - 36.0 g/dL    RDW 83.8 (H) 88.4 - 15.5 %   Platelets 89 (L) 150 - 400 K/uL    Comment: SPECIMEN CHECKED FOR CLOTS CONSISTENT WITH PREVIOUS RESULT REPEATED TO VERIFY Immature Platelet Fraction may be clinically indicated, consider ordering this additional test [id number]    nRBC 0.0 0.0 - 0.2 %    Comment: Performed at [facility] Lab, [address], [place], [place] [address]  Lactate dehydrogenase (LDH)     Status: Abnormal   Collection Time: 12/02/24  5:22 AM  Result Value Ref Range   LDH 344 (H) 105 - 235 U/L    Comment: Please note change in reference range. Performed at [facility] Lab, [address], [place], [place] [address]   Comprehensive metabolic panel     Status: Abnormal   Collection Time: 12/02/24  5:22 AM  Result Value Ref Range   Sodium 133 (L) 135 - 145 mmol/L   Potassium 4.8 3.5 - 5.1 mmol/L   Chloride 101 98 - 111 mmol/L   CO2 24 22 - 32 mmol/L   Glucose, Bld 169 (H) 70 - 99 mg/dL    Comment: Glucose reference range applies only to samples taken after fasting for at least 8 hours.   BUN 12 6 - 20 mg/dL   Creatinine, Ser 9.07 0.61 - 1.24 mg/dL   Calcium  8.3 (L) 8.9 - 10.3 mg/dL   Total Protein 4.9 (L) 6.5 - 8.1 g/dL   Albumin  2.4 (L) 3.5 - 5.0 g/dL   AST 68 (H) 15 - 41 U/L   ALT 28 0 - 44 U/L   Alkaline Phosphatase 49 38 - 126 U/L   Total Bilirubin 1.1 0.0 - 1.2 mg/dL   GFR, Estimated >39 >39 mL/min    Comment: (NOTE) Calculated using the CKD-EPI Creatinine Equation (2021)    Anion gap 8 5 - 15    Comment: Performed at [facility] Lab, [address], [place], [place] [address]  I-STAT 7, (LYTES, BLD GAS, ICA, H+H)     Status: Abnormal   Collection Time: 12/02/24  5:26 AM  Result Value Ref Range   pH, Arterial 7.383 7.35 - 7.45   pCO2 arterial 40.6 32 - 48 mmHg   pO2, Arterial 71 (L) 83 - 108 mmHg   Bicarbonate 24.2 20.0 - 28.0 mmol/L   TCO2 25 22 - 32 mmol/L   O2 Saturation 94 %   Acid-base deficit 1.0 0.0 - 2.0 mmol/L   Sodium 132 (L) 135 - 145  mmol/L   Potassium 4.9 3.5 - 5.1 mmol/L   Calcium , Ion 1.21 1.15 - 1.40 mmol/L   HCT 26.0 (L) 39.0 - 52.0 %   Hemoglobin 8.8 (L) 13.0 - 17.0 g/dL   Patient temperature 63.1 C    Sample type ARTERIAL   Cooxemetry Panel (carboxy, met, total  hgb, O2 sat)     Status: Abnormal   Collection Time: 12/02/24  5:34 AM  Result Value Ref Range   Total hemoglobin 7.8 (L) 12.0 - 16.0 g/dL   O2 Saturation 39.2 %   Carboxyhemoglobin 2.2 (H) 0.5 - 1.5 %   Methemoglobin <0.7 0.0 - 1.5 %    Comment: Performed at [facility] Lab, [address], [place], [place] [address]  Prepare RBC (crossmatch)     Status: None   Collection Time: 12/02/24  8:25 AM  Result Value Ref Range   Order Confirmation      ORDER PROCESSED BY BLOOD BANK Performed at [facility] Lab, [address], [place], [place] [address]   Glucose, capillary     Status: Abnormal   Collection Time: 12/02/24  9:08 AM  Result Value Ref Range   Glucose-Capillary 144 (H) 70 - 99 mg/dL    Comment: Glucose reference range applies only to samples taken after fasting for at least 8 hours.  Glucose, capillary     Status: Abnormal   Collection Time: 12/02/24 11:35 AM  Result Value Ref Range   Glucose-Capillary 117 (H) 70 - 99 mg/dL    Comment: Glucose reference range applies only to samples taken after fasting for at least 8 hours.  Hemoglobin and hematocrit, blood     Status: Abnormal   Collection Time: 12/02/24  1:00 PM  Result Value Ref Range   Hemoglobin 8.9 (L) 13.0 - 17.0 g/dL   HCT 75.0 (L) 60.9 - 47.9 %    Comment: Performed at [facility] Lab, [address], [place], [place] [address]  Glucose, capillary     Status: Abnormal   Collection Time: 12/02/24  3:46 PM  Result Value Ref Range   Glucose-Capillary 125 (H) 70 - 99 mg/dL    Comment: Glucose reference range applies only to samples taken after fasting for at least 8 hours.  Cooxemetry Panel (carboxy, met, total hgb, O2 sat)     Status: Abnormal   Collection Time:  12/02/24  6:37 PM  Result Value Ref Range   Total hemoglobin 9.2 (L) 12.0 - 16.0 g/dL   O2 Saturation 48.5 %   Carboxyhemoglobin 1.2 0.5 - 1.5 %   Methemoglobin <0.7 0.0 - 1.5 %    Comment: Performed at [facility] Lab, [address] [place], [place] [address]  Glucose, capillary     Status: Abnormal   Collection Time: 12/02/24  8:02 PM  Result Value Ref Range   Glucose-Capillary 108 (H) 70 - 99 mg/dL    Comment: Glucose reference range applies only to samples taken after fasting for at least 8 hours.  Glucose, capillary     Status: Abnormal   Collection Time: 12/02/24 11:04 PM  Result Value Ref Range   Glucose-Capillary 121 (H) 70 - 99 mg/dL    Comment: Glucose reference range applies only to samples taken after fasting for at least 8 hours.  I-STAT 7, (LYTES, BLD GAS, ICA, H+H)     Status: Abnormal   Collection Time: 12/03/24  4:08 AM  Result Value Ref Range   pH, Arterial 7.474 (H) 7.35 - 7.45   pCO2 arterial 36.4 32 - 48 mmHg   pO2, Arterial 92 83 - 108 mmHg   Bicarbonate 26.8 20.0 - 28.0 mmol/L   TCO2 28 22 - 32 mmol/L   O2 Saturation 98 %   Acid-Base Excess 3.0 (H) 0.0 - 2.0 mmol/L   Sodium 131 (L) 135 -  145 mmol/L   Potassium 4.1 3.5 - 5.1 mmol/L   Calcium , Ion 1.21 1.15 - 1.40 mmol/L   HCT 27.0 (L) 39.0 - 52.0 %   Hemoglobin 9.2 (L) 13.0 - 17.0 g/dL   Patient temperature 63.3 C    Sample type ARTERIAL   Cooxemetry Panel (carboxy, met, total hgb, O2 sat)     Status: Abnormal   Collection Time: 12/03/24  5:05 AM  Result Value Ref Range   Total hemoglobin <6.9 (LL) 12.0 - 16.0 g/dL    Comment: CRITICAL RESULT CALLED TO, READ BACK BY AND VERIFIED WITH: RN MARIE ADDISON @ [phone] 12/03/2024 BY BARON J.    O2 Saturation 71.5 %   Carboxyhemoglobin 1.3 0.5 - 1.5 %   Methemoglobin 1.1 0.0 - 1.5 %    Comment: Performed at [facility] Lab, [address], [place], [place] [address]  Glucose, capillary     Status: None   Collection Time: 12/03/24  5:05 AM  Result Value  Ref Range   Glucose-Capillary 99 70 - 99 mg/dL    Comment: Glucose reference range applies only to samples taken after fasting for at least 8 hours.  Basic metabolic panel     Status: Abnormal   Collection Time: 12/03/24  5:06 AM  Result Value Ref Range   Sodium 129 (L) 135 - 145 mmol/L   Potassium 4.0 3.5 - 5.1 mmol/L   Chloride 96 (L) 98 - 111 mmol/L   CO2 27 22 - 32 mmol/L   Glucose, Bld 92 70 - 99 mg/dL    Comment: Glucose reference range applies only to samples taken after fasting for at least 8 hours.   BUN 16 6 - 20 mg/dL   Creatinine, Ser 9.30 0.61 - 1.24 mg/dL   Calcium  8.1 (L) 8.9 - 10.3 mg/dL   GFR, Estimated >39 >39 mL/min    Comment: (NOTE) Calculated using the CKD-EPI Creatinine Equation (2021)    Anion gap 6 5 - 15    Comment: Performed at [facility] Lab, [address], [place], [place] [address]  CBC     Status: Abnormal   Collection Time: 12/03/24  5:06 AM  Result Value Ref Range   WBC 14.6 (H) 4.0 - 10.5 K/uL   RBC 2.39 (L) 4.22 - 5.81 MIL/uL   Hemoglobin 7.5 (L) 13.0 - 17.0 g/dL    Comment: REPEATED TO VERIFY   HCT 21.5 (L) 39.0 - 52.0 %   MCV 90.0 80.0 - 100.0 fL   MCH 31.4 26.0 - 34.0 pg   MCHC 34.9 30.0 - 36.0 g/dL   RDW 84.7 88.4 - 84.4 %   Platelets 82 (L) 150 - 400 K/uL    Comment: CONSISTENT WITH PREVIOUS RESULT REPEATED TO VERIFY Immature Platelet Fraction may be clinically indicated, consider ordering this additional test [id number]    nRBC 0.0 0.0 - 0.2 %    Comment: Performed at [facility] Lab, [address], [place], [place] [address]  Lactate dehydrogenase (LDH)     Status: Abnormal   Collection Time: 12/03/24  5:06 AM  Result Value Ref Range   LDH 341 (H) 105 - 235 U/L    Comment: Performed at [facility] Lab, [address] [place], [place] [address]  Magnesium      Status: None   Collection Time: 12/03/24  5:06 AM  Result Value Ref Range   Magnesium  1.7 1.7 - 2.4 mg/dL    Comment: Performed at [facility] Lab,  [address] [place], [place] [address]  Glucose, capillary     Status: None   Collection Time: 12/03/24  6:57 AM  Result Value Ref Range   Glucose-Capillary 95 70 - 99 mg/dL    Comment: Glucose reference range applies only to samples taken after fasting for at least 8 hours.  Cooxemetry Panel (carboxy, met, total hgb, O2 sat)     Status: Abnormal   Collection Time: 12/03/24  6:58 AM  Result Value Ref Range   Total hemoglobin 8.2 (L) 12.0 - 16.0 g/dL   O2 Saturation 34.4 %   Carboxyhemoglobin 2.1 (H) 0.5 - 1.5 %   Methemoglobin 0.7 0.0 - 1.5 %    Comment: Performed at [facility] Lab, [address] [place], [place] [address]  Glucose, capillary     Status: Abnormal   Collection Time: 12/03/24  7:45 AM  Result Value Ref Range   Glucose-Capillary 141 (H) 70 - 99 mg/dL    Comment: Glucose reference range applies only to samples taken after fasting for at least 8 hours.  Glucose, capillary     Status: Abnormal   Collection Time: 12/03/24 10:48 AM  Result Value Ref Range   Glucose-Capillary 129 (H) 70 - 99 mg/dL    Comment: Glucose reference range applies only to samples taken after fasting for at least 8 hours.  CBC     Status: Abnormal   Collection Time: 12/03/24  3:16 PM  Result Value Ref Range   WBC 13.8 (H) 4.0 - 10.5 K/uL   RBC 2.55 (L) 4.22 - 5.81 MIL/uL   Hemoglobin 8.0 (L) 13.0 - 17.0 g/dL   HCT 77.2 (L) 60.9 - 47.9 %   MCV 89.0 80.0 - 100.0 fL   MCH 31.4 26.0 - 34.0 pg   MCHC 35.2 30.0 - 36.0 g/dL   RDW 85.1 88.4 - 84.4 %   Platelets 95 (L) 150 - 400 K/uL    Comment: REPEATED TO VERIFY PLATELET COUNT CONFIRMED BY SMEAR Immature Platelet Fraction may be clinically indicated, consider ordering this additional test [id number]    nRBC 0.2 0.0 - 0.2 %    Comment: Performed at [facility] Lab, [address] [place], [place] [address]  Type and screen [facility]     Status: None (Preliminary result)   Collection Time: 12/03/24  3:23 PM  Result  Value Ref Range   ABO/RH(D) O POS    Antibody Screen NEG    Sample Expiration      12/06/2024,2359 Performed at [facility] Lab, [address], [place], [place] [address]    Unit Number [id number]    Blood Component Type RED CELLS,LR    Unit division 00    Status of Unit ISSUED    Transfusion Status OK TO TRANSFUSE    Crossmatch Result Compatible    Unit Number [id number]    Blood Component Type RED CELLS,LR    Unit division 00    Status of Unit ISSUED    Transfusion Status OK TO TRANSFUSE    Crossmatch Result Compatible    Unit Number [id number]    Blood Component Type RED CELLS,LR    Unit division 00    Status of Unit ISSUED    Transfusion Status OK TO TRANSFUSE    Crossmatch Result Compatible   Glucose, capillary     Status: None   Collection Time: 12/03/24  3:38 PM  Result Value Ref Range   Glucose-Capillary 99 70 - 99  mg/dL    Comment: Glucose reference range applies only to samples taken after fasting for at least 8 hours.  Body fluid cell count with differential     Status: Abnormal   Collection Time: 12/03/24  4:15 PM  Result Value Ref Range   Fluid Type-FCT PLEURAL     Comment: CORRECTED ON 12/11 AT 1929: PREVIOUSLY REPORTED AS previously recieved wrong specimen   Color, Fluid RED (A) YELLOW   Appearance, Fluid HAZY (A) CLEAR   Total Nucleated Cell Count, Fluid 830 0 - 1,000 cu mm   Neutrophil Count, Fluid 78 (H) 0 - 25 %   Lymphs, Fluid 13 %   Monocyte-Macrophage-Serous Fluid 9 (L) 50 - 90 %   Eos, Fluid 0 %   Other Cells, Fluid DEGENERATIVE CELLS %    Comment: Performed at [facility] Lab, [address], [place], [place] [address]  Basic metabolic panel     Status: Abnormal   Collection Time: 12/03/24  6:07 PM  Result Value Ref Range   Sodium 131 (L) 135 - 145 mmol/L   Potassium 4.1 3.5 - 5.1 mmol/L   Chloride 97 (L) 98 - 111 mmol/L   CO2 25 22 - 32 mmol/L   Glucose, Bld 145 (H) 70 - 99 mg/dL    Comment: Glucose reference range applies  only to samples taken after fasting for at least 8 hours.   BUN 19 6 - 20 mg/dL   Creatinine, Ser 8.95 0.61 - 1.24 mg/dL   Calcium  8.3 (L) 8.9 - 10.3 mg/dL   GFR, Estimated >39 >39 mL/min    Comment: (NOTE) Calculated using the CKD-EPI Creatinine Equation (2021)    Anion gap 9 5 - 15    Comment: Performed at [facility] Lab, [address] [place], [place] [address]  Hemoglobin and hematocrit, blood     Status: Abnormal   Collection Time: 12/03/24  6:07 PM  Result Value Ref Range   Hemoglobin 5.4 (LL) 13.0 - 17.0 g/dL    Comment: REPEATED TO VERIFY This critical result has been called to CANDIE Smiling RN by Vernard Peabody on 12/03/2024 18:47:56, and has been read back.    HCT 15.7 (L) 39.0 - 52.0 %    Comment: Performed at [facility], [address], [place], [place] [address]  I-STAT 7, (LYTES, BLD GAS, ICA, H+H)     Status: Abnormal   Collection Time: 12/03/24  6:55 PM  Result Value Ref Range   pH, Arterial 7.443 7.35 - 7.45   pCO2 arterial 36.1 32 - 48 mmHg   pO2, Arterial 84 83 - 108 mmHg   Bicarbonate 24.8 20.0 - 28.0 mmol/L   TCO2 26 22 - 32 mmol/L   O2 Saturation 97 %   Acid-Base Excess 1.0 0.0 - 2.0 mmol/L   Sodium 131 (L) 135 - 145 mmol/L   Potassium 4.0 3.5 - 5.1 mmol/L   Calcium , Ion 1.16 1.15 - 1.40 mmol/L   HCT 15.0 (L) 39.0 - 52.0 %   Hemoglobin 5.1 (LL) 13.0 - 17.0 g/dL   Patient temperature 02.2 F    Sample type ARTERIAL    Comment NOTIFIED PHYSICIAN   Prepare RBC (crossmatch)     Status: None   Collection Time: 12/03/24  7:05 PM  Result Value Ref Range   Order Confirmation      ORDER PROCESSED BY BLOOD BANK Performed at [facility] Lab, [address], [place], [place] [address]   Prepare RBC (  crossmatch)     Status: None   Collection Time: 12/03/24  7:30 PM  Result Value Ref Range   Order Confirmation      ORDER PROCESSED BY BLOOD BANK Performed at [facility] Lab, [address], [place], [place] [address]   Prepare platelet pheresis      Status: None (Preliminary result)   Collection Time: 12/03/24  7:30 PM  Result Value Ref Range   Unit Number [id number]    Blood Component Type PLTP1 PSORALEN TREATED    Unit division 00    Status of Unit ISSUED    Transfusion Status OK TO TRANSFUSE    Unit Number [id number]    Blood Component Type PLTP2 PSORALEN TREATED    Unit division 00    Status of Unit ISSUED    Transfusion Status OK TO TRANSFUSE   I-STAT 7, (LYTES, BLD GAS, ICA, H+H)     Status: Abnormal   Collection Time: 12/03/24  9:23 PM  Result Value Ref Range   pH, Arterial 7.459 (H) 7.35 - 7.45   pCO2 arterial 34.3 32 - 48 mmHg   pO2, Arterial 82 (L) 83 - 108 mmHg   Bicarbonate 24.7 20.0 - 28.0 mmol/L   TCO2 26 22 - 32 mmol/L   O2 Saturation 97 %   Acid-Base Excess 1.0 0.0 - 2.0 mmol/L   Sodium 132 (L) 135 - 145 mmol/L   Potassium 4.2 3.5 - 5.1 mmol/L   Calcium , Ion 1.12 (L) 1.15 - 1.40 mmol/L   HCT 20.0 (L) 39.0 - 52.0 %   Hemoglobin 6.8 (LL) 13.0 - 17.0 g/dL   Patient temperature 64.3 C    Collection site art line    Drawn by Nurse    Sample type ARTERIAL    Comment NOTIFIED PHYSICIAN       Review of Systems  Unable to perform ROS: Acuity of condition   Blood pressure 101/86, pulse 100, temperature (!) 95.2 F (35.1 C), resp. rate (!) 22, height 5' 10 (1.778 m), weight 75.9 kg, SpO2 99%. Physical Exam Constitutional:      General: He is in acute distress.  HENT:     Mouth/Throat:     Mouth: Mucous membranes are dry.  Cardiovascular:     Rate and Rhythm: Tachycardia present.     Comments: Impella Pulmonary:     Comments: Somewhat tachypneic Abdominal:     General: There is no distension.     Tenderness: There is abdominal tenderness. There is no guarding or rebound.  Musculoskeletal:        General: No tenderness.  Skin:    Comments: Some sweating  Neurological:     Mental Status: He is alert.     Comments: Conversant and answers questions, follows commands      Assessment/Plan: S/P AVR, MCR, Impella placement by Dr. Lucas 12/8  S/P right thoracentesis with hemorrhage from intercostal -chest tube placed by TCTS.  Going to IR with Dr. Philip.  Discussed with him and Dr. Daniel at the bedside.  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  Lactate 3.3. Will plan exploratory laparotomy with possible bowel resection after his IR procedure.  I discussed the procedure, risk, benefits with his wife.  I discussed the possibility of having to resect some bowel and leave him open in discontinuity and bring him back for further surgery at a later date.  She  is agreeable.  IV Zosyn.  Dann FORBES Hummer 12/03/2024, 10:41 PM         [1] No Known Allergies

## 2024-12-03 NOTE — Progress Notes (Addendum)
 Brief Progress Note  Called by ICU regarding new right hemothorax and drop in hgb to 5.4 after thoracentesis this afternoon.  On evaluation, patient is diaphoretic and in mild distress.  Vitals stable but epi started for hypotension.  Saturating well on 2L despite large hemothorax.  Right pigtail was placed with local analgesia.  Patient tolerated the procedure well.  Chest was slowly drained with over 2.5L over dark bloody output.  After 2.5L of drainage, an ultrasound of the right chest was performed without much residual blood.  Will proceed to CT scan to evaluate for active bleeding.  Will also scan abdomen as colon is very dilated on XR and he is complaining of abdominal pain.  Patient given 3 PRBC and 2 platelets. Remains hemodynamically stable, on/off epi 1.  Con Clunes, MD Cardiothoracic Surgery Pager: [phone]  ADDENDUM: CT scan demonstrated active extravasation from a posterior intercostal artery.  Also demonstrated portal venous gas and pneumatosis of the small and large bowel.  I spoke to Dr. Juliene Balder (IR) re: embolization and Dr. Dann Hummer (ACS) re: ischemic bowel.  Both have evaluated the patient. Plan is for IR embolization and direct transfer to the OR for exploratory laparotomy.  Patient has become more unstable. Started vaso, levo gtt. Also started Zosyn.  Will order additional 3 PRBC and 3 FFP.  Plan discussed with patient and his wife.

## 2024-12-03 NOTE — Anesthesia Postprocedure Evaluation (Signed)
 Anesthesia Post Note  Patient: Hollister Wessler Leiter  Procedure(s) Performed: REPLACEMENT, AORTIC VALVE, OPEN WITH INSPIRIS RESILIA AORTIC VALVE 23mm (Chest) REPLACEMENT, MITRAL VALVE WITH MITRIS RESILIA MITRAL VALVE 27mm (Chest) INSERTION, CARDIAC ASSIST DEVICE, IMPELLA ECHOCARDIOGRAM, TRANSESOPHAGEAL, INTRAOPERATIVE     Patient location during evaluation: SICU Anesthesia Type: General Level of consciousness: sedated Pain management: pain level controlled Vital Signs Assessment: post-procedure vital signs reviewed and stable Respiratory status: patient remains intubated per anesthesia plan Cardiovascular status: stable Postop Assessment: no apparent nausea or vomiting Anesthetic complications: no   No notable events documented.  Last Vitals:  Vitals:   12/03/24 1013 12/03/24 1014  BP:    Pulse: 92 92  Resp: 16 19  Temp: (!) 36 C (!) 36 C  SpO2: 98% 96%    Last Pain:  Vitals:   12/03/24 0830  TempSrc:   PainSc: 4                  Jewelz Ricklefs P Wendelin Bradt

## 2024-12-03 NOTE — Progress Notes (Signed)
 Afternoon HB with signfiicant drop, now lower BP. Had gotten albumin  1h ago.  STAT CBC repeat, ABG to correlate with lab h/h, CXR.  Istat hb correlates. CXR with large recurrence of effusion. 3 pRBC ordered, 2 platelets. [place] heparin  d/c. D/w Dr Daniel who will place chest tube. IR consulted for embolization; getting CT chest now  Leita SHAUNNA Gaskins, DO 12/03/2024 7:18 PM [facility]  For contact information, see Amion. If no response to pager, please call PCCM consult pager. After hours, 7PM- 7AM, please call Elink.

## 2024-12-03 NOTE — Telephone Encounter (Signed)
 Pharmacy Patient Advocate Encounter  Insurance verification completed.    The patient is insured through [facility] MEDICAID.     Ran test claim for Breo Ellipta 100-25mcg and the current 30 day co-pay is $4.   This test claim was processed through [employer]- copay amounts may vary at other pharmacies due to [employer], or as the patient moves through the different stages of their insurance plan.

## 2024-12-03 NOTE — Anesthesia Preprocedure Evaluation (Signed)
 Anesthesia Evaluation  Patient identified by MRN, date of birth, ID band Patient awake  Preop documentation limited or incomplete due to emergent nature of procedure.  Airway Mallampati: III       Dental  (+) Missing   Pulmonary asthma , Current Smoker and Patient abstained from smoking.   Pulmonary exam normal        Cardiovascular +CHF  Normal cardiovascular exam  ECHO: 1. Impella 4. 88 cm from AV to distal end within LV cavity. Left ventricular ejection fraction, by estimation, is < 20% . The left ventricle has severely decreased function. 2. The mitral valve has been repaired/ replaced. Echo findings are consistent with normal structure and function of the mitral valve prosthesis. 3. The aortic valve has been repaired/ replaced. There is a 23 mm Edwards RESILIA valve present in the aortic position. Echo findings are consistent with normal structure and function of the aortic valve prosthesis. 4. There is normal pulmonary artery systolic pressure. The estimated right ventricular systolic pressure is 33. 2 mmHg.   Neuro/Psych    GI/Hepatic ,,,(+)     substance abuse    Endo/Other  hyponatremia  Renal/GU      Musculoskeletal   Abdominal   Peds  Hematology  (+) Blood dyscrasia, anemia Thrombocytopenia INR: 1.6   Anesthesia Other Findings ischemic bowel  Reproductive/Obstetrics                              Anesthesia Physical Anesthesia Plan  ASA: 5 and emergent  Anesthesia Plan: General   Post-op Pain Management:    Induction: Intravenous  PONV Risk Score and Plan: 1 and Treatment may vary due to age or medical condition  Airway Management Planned: Oral ETT  Additional Equipment:   Intra-op Plan:   Post-operative Plan: Post-operative intubation/ventilation  Informed Consent:      Only emergency history available  Plan Discussed with: CRNA  Anesthesia Plan Comments:           Anesthesia Quick Evaluation

## 2024-12-03 NOTE — Progress Notes (Signed)
 NAME:  Alejandro Lopez, MRN:  [id number], DOB:  [birth year], LOS: 10 ADMISSION DATE:  11/22/2024, CONSULTATION DATE:  12/8 REFERRING MD:  Lucas, CHIEF COMPLAINT:  post cardiac surgery critical care services    History of Present Illness:  43 year old male patient with history of hypertension, alcohol and tobacco abuse, presented to the emergency room on 11/30 with approximately 1 month history of progressive dyspnea accompanied by lower extremity swelling extending up to the level of his scrotum and abdomen. Diagnostic evaluation by echocardiogram showed left ventricular ejection fraction 35 to 40% with grade 3 diastolic dysfunction this was further complicated by severe mitral valve regurgitation and aortic insufficiency because of this he was transferred to [facility] for further evaluation. He underwent cardiac catheterization on 12/4: Right heart hemodynamic parameters showed baseline right atrial pressure 5 mmHg, PA pressure 32/17, pulmonary capillary wedge pressure at 14 estimated Fick 4.9 L/min with cardiac index Fick calculated at 2.59 His PAPi was 3 Left heart cath was negative for coronary artery disease Went to OR 12/8 for MVR and AVR w/ impella insertion. PCCM asked to assist w/ post op care   OR course EBL: 1735 Received  Products: cryo 92ml, FFP 401 Also received DDAVP , 2200 crystalloid, 250ml albumin   Cell saver 1125 Pump time 4hrs Clamp time 2hrs 50 min  Events: multiple defibrillations intra-op  Intra-op ECHO EF estimated 40%  Pertinent  Medical History  Tobacco abuse, alcohol abuse, hypertension  Significant Hospital Events: Including procedures, antibiotic start and stop dates in addition to other pertinent events   11/30 admitted/. ECHO 35 to 40% with grade 3 diastolic dysfunction this was further complicated by severe mitral valve regurgitation and aortic insufficiency 12/4 left and right heart cath 12/8 AVR and MVR w/ bioprosthetic valves. Arrived on icu  w/ Impella 5.5 MCS at flow 4 lpm and P 7. Received 2 more PLTs, 2 FFP and 1 cryo in first 6 hrs post op for cont blood loss/oozing. Required NE, epi and milrinone . Good flow on IMPELLA but minimal pulsatility  at P7. Placed on Amio gtt for VT 12/9 received 1 unit PRBC over night for hgb down to 7.5, hgb 8 getting second unit of blood. Still on P7 3.5 lPM. Extubated.  Interim History / Subjective:  No BM since surgery. Walked in the hall. Appetite improving, no nausea. Pain moderate, but tolerable.   Objective    Blood pressure (!) 122/104, pulse 94, temperature 97.7 F (36.5 C), resp. rate 16, height 5' 10 (1.778 m), weight 75.9 kg, SpO2 95%. PAP: (42-92)/(10-49) 48/13 CVP:  [0 mmHg-41 mmHg] 6 mmHg CO:  [4.6 L/min-7.9 L/min] 5.8 L/min CI:  [2.5 L/min/m2-4.2 L/min/m2] 3.1 L/min/m2  FiO2 (%):  [32 %] 32 %   Intake/Output Summary (Last 24 hours) at 12/03/2024 0726 Last data filed at 12/03/2024 0700 Gross per 24 hour  Intake 1432.69 ml  Output 2355 ml  Net -922.31 ml   Filed Weights   12/01/24 1000 12/02/24 0500 12/03/24 0500  Weight: 77.7 kg 76.4 kg 75.9 kg    Examination: General middle aged man sitting up in the chair in NAD HENT  Mobile/AT, eyes anicteric Pulm breathing comfortably on [place], improved basilar breath sounds.  Cardiac: Impella P7, RRR Abd soft, NT Ext no LE edema, no cyanosis. R arm well perfused.  Neuro Awake, alert, moving all extremities. Answering questions appropriately.    Coox 72%, repeat 66% Na+ 129 BUN 16 Cr 0.69 WBC 14.6 H/H 7.5/21.5 Platelets 82 CXR personally reviewed> enlarging  R effusion,  LIJ swan, impella R axillary.   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 -GDMT limited with shock. Con't digoxin . Hopefully can start spiro soon. -lasix  increased to 80mg  BID -needs warfarin for ~3 months after impella is removed -con't to progress diet & mobility post-op -con't swan per AHF  R pleural effusion -may need thora -con't diuresis   VT -amiodarone  while on inotropes -monitor electrolytes and replete as needed  Hyperglycemia; h/o pre-DM. A1c 6.1 -SSI PRN -OP follow up with PCP for prediabetes   H/o tobacco abuse H/o asthma vs COPD -Quitting smoking recommended-- discussed strategies for this today. Wife is trying to quit too.  -Switch from nebs to Breo for asthma history (diagnosed in childhood) -pulmonary hygiene   ETOH abuse -he is motivated to quit, I encouraged this -vitamins   Anemia, thrombocytopenia> potentially from post-op + impella -monitor, transfuse for Hb <7 or hemodynamically significant bleeding  Constipation -mobility -sorbitol today  Hyponatremia -diuresis -avoid hypotonic fluids  DVT: Brooks heparin  until platelets improve D/c foley, keep lines  Plan discussed during multidisciplinary rounds.    Critical care time:      This patient is critically ill with multiple organ system failure which requires frequent high complexity decision making, assessment, support, evaluation, and titration of therapies. This was completed through the application of advanced monitoring technologies and extensive interpretation of multiple databases. During this encounter critical care time was devoted to patient care services described in this note for 37 minutes.  Leita SHAUNNA Gaskins, DO 12/03/2024 9:16 AM [facility]  For contact information, see Amion. If no response to pager, please call PCCM consult pager. After hours, 7PM- 7AM, please call Elink.

## 2024-12-03 NOTE — Procedures (Signed)
 Thoracentesis  Procedure Note  Alejandro Lopez  [id number]  03/16/1981  Date:12/03/2024  Time:3:14 PM   Provider Performing:Roshelle Traub C Claudene   Procedure: Thoracentesis with imaging guidance (67444)  Indication(s) Pleural Effusion  Consent Risks of the procedure as well as the alternatives and risks of each were explained to the patient and/or caregiver.  Consent for the procedure was obtained and is signed in the bedside chart  Anesthesia Topical only with 1% lidocaine     Time Out Verified patient identification, verified procedure, site/side was marked, verified correct patient position, special equipment/implants available, medications/allergies/relevant history reviewed, required imaging and test results available.   Sterile Technique Maximal sterile technique including full sterile barrier drape, hand hygiene, sterile gown, sterile gloves, mask, hair covering, sterile ultrasound probe cover (if used).  Procedure Description Ultrasound was used to identify appropriate pleural anatomy for placement and overlying skin marked.  Area of drainage cleaned and draped in sterile fashion. Lidocaine  was used to anesthetize the skin and subcutaneous tissue.  1000 cc's of blood was drained from the right pleural space. Catheter then removed and bandaid applied to site.   Complications/Tolerance None; patient tolerated the procedure well. Chest X-ray is ordered to confirm no post-procedural complication.   EBL Minimal   Specimen(s) Pleural fluid

## 2024-12-03 NOTE — Progress Notes (Addendum)
 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  12/9 VT/VF ? vagal episode. Extubated  Flow (Liters/min): 3.6 Liters/min Performance Level: P7 Recent Labs    12/01/24 0458 12/02/24 0522 12/03/24 0506  LDH 290* 344* 341*   CO-OX 66%  PAP: (42-92)/(10-49) 48/13 CVP:  [0 mmHg-41 mmHg] 6 mmHg CO:  [4.6 L/min-7.9 L/min] 5.8 L/min CI:  [2.5 L/min/m2-4.2 L/min/m2] 3.1 L/min/m2  Milrinone  0.25 mcg and Epi @1 .   Feels good this morning. Sitting up in chair. Was able to walk around yesterday.    Objective:   Weight Range: 75.9 kg Body mass index is 24.01 kg/m.   Vital Signs:   Temp:  [97.2 F (36.2 C)-98.2 F (36.8 C)] 97.7 F (36.5 C) (12/11 0700) Pulse Rate:  [89-110] 94 (12/11 0700) Resp:  [0-32] 16 (12/11 0700) BP: (108-138)/(81-109) 122/104 (12/11 0700) SpO2:  [89 %-99 %] 95 % (12/11 0700) Arterial Line BP: (88-155)/(63-118) 120/78 (12/11 0700) FiO2 (%):  [28 %-32 %] 32 % (12/10 1916) Weight:  [75.9 kg] 75.9 kg (12/11 0500) Last BM Date : 11/29/24  Weight change: Filed Weights   12/01/24 1000 12/02/24 0500 12/03/24 0500  Weight: 77.7 kg 76.4 kg 75.9 kg    Intake/Output:   Intake/Output Summary (Last 24 hours) at 12/03/2024 0719 Last data filed at 12/03/2024 0700 Gross per 24 hour  Intake 1432.69 ml  Output 2355 ml  Net -922.31 ml     CVP 6 Physical Exam  General:  acutely ill appearing.  No respiratory difficulty +[place] Neck: JVD UTA. LIJ Swann Cor: Regular rate & rhythm. No murmurs. +Impella 5.5 Lungs: coarse bases Extremities: no edema  GU: + foley Neuro: alert & oriented x 3. Affect pleasant.   Telemetry   NSR 90s, intt short runs of NSVT + intt PVCs (Personally reviewed)    Labs    CBC Recent Labs    12/02/24 0522 12/02/24 0526 12/03/24 0408 12/03/24 0506  WBC 15.6*  --   --  14.6*  HGB 7.8*   < > 9.2* 7.5*  HCT  21.8*   < > 27.0* 21.5*  MCV 89.7  --   --  90.0  PLT 89*  --   --  82*   < > = values in this interval not displayed.   Basic Metabolic Panel Recent Labs    87/90/74 1457 12/02/24 0522 12/02/24 0526 12/03/24 0408 12/03/24 0506  NA 135 133*   < > 131* 129*  K 4.4 4.8   < > 4.1 4.0  CL 102 101  --   --  96*  CO2 26 24  --   --  27  GLUCOSE 108* 169*  --   --  92  BUN 13 12  --   --  16  CREATININE 0.85 0.92  --   --  0.69  CALCIUM  8.0* 8.3*  --   --  8.1*  MG 1.7  --   --   --  1.7   < > = values in this interval not displayed.   Liver Function Tests Recent Labs    12/02/24 0522  AST 68*  ALT 28  ALKPHOS 49  BILITOT 1.1  PROT 4.9*  ALBUMIN  2.4*   No results for input(s): LIPASE, AMYLASE in the last 72 hours. Cardiac Enzymes No results for input(s): CKTOTAL, CKMB, CKMBINDEX, TROPONINI in the last 72 hours.  BNP: BNP (last 3 results) No results for input(s): BNP in the last 8760 hours.  ProBNP (last 3 results) Recent Labs    11/22/24 1958  PROBNP 3,354.0*     D-Dimer No results for input(s): DDIMER in the last 72 hours. Hemoglobin A1C No results for input(s): HGBA1C in the last 72 hours. Fasting Lipid Panel No results for input(s): CHOL, HDL, LDLCALC, TRIG, CHOLHDL, LDLDIRECT in the last 72 hours. Thyroid Function Tests No results for input(s): TSH, T4TOTAL, T3FREE, THYROIDAB in the last 72 hours.  Invalid input(s): FREET3  Other results:   Imaging    ECHOCARDIOGRAM LIMITED Result Date: 12/02/2024    ECHOCARDIOGRAM LIMITED REPORT   Patient Name:   Alejandro Lopez Date of Exam: 12/02/2024 Medical Rec #:  [id number]        Height:       70.0 in Accession #:    [id number]       Weight:       168.4 lb Date of Birth:  [birth year]       BSA:          1.940 m Patient Age:    43 years         BP:           122/100 mmHg Patient Gender: M                HR:           97 bpm. Exam Location:  Inpatient Procedure: Limited  Echo and Limited Color Doppler (Both Spectral and Color Flow            Doppler were utilized during procedure). Indications:    Impella position check  History:        Patient has prior history of Echocardiogram examinations, most                 recent 12/01/2024. CHF; Risk Factors:Hypertension and Current                 Smoker.                 Aortic Valve: 23 mm Edwards RESILIA valve is present in the                 aortic position.  Sonographer:    Thea Norlander RCS Referring Phys: [phone] AMY D CLEGG IMPRESSIONS  1. Impella 4.88 cm from AV to distal end within LV cavity. Left ventricular ejection fraction, by estimation, is <20%. The left ventricle has severely decreased function.  2. The mitral valve has been repaired/replaced. Echo findings are consistent with normal structure and function of the mitral valve prosthesis.  3. The aortic valve has been repaired/replaced. There is a 23 mm Edwards RESILIA valve present in the aortic position. Echo findings are consistent with normal structure and function of the aortic valve prosthesis.  4. There is normal pulmonary artery systolic pressure. The estimated right ventricular systolic pressure is 33.2 mmHg. FINDINGS  Left Ventricle: Impella 4.88 cm from AV to distal end within LV cavity. Left ventricular ejection fraction, by estimation, is <20%. The left ventricle has severely decreased function. Right Ventricle: There is normal pulmonary artery systolic pressure. The tricuspid regurgitant velocity is 2.51 m/s, and with an assumed right atrial pressure of 8 mmHg, the estimated right ventricular systolic pressure is 33.2 mmHg. Mitral Valve: The mitral valve has been repaired/replaced. There is a 27 mm Edwards MITRIS bioprosthetic valve present in the mitral position. Echo  findings are consistent with normal structure and function of the mitral valve prosthesis. Tricuspid Valve: Tricuspid valve regurgitation is mild. Aortic Valve: The aortic valve has been  repaired/replaced. There is a 23 mm Edwards RESILIA valve present in the aortic position. Echo findings are consistent with normal structure and function of the aortic valve prosthesis. Additional Comments: A venous catheter is visualized in the right ventricle.  TRICUSPID VALVE TR Peak grad:   25.2 mmHg TR Vmax:        251.00 cm/s Oneil Parchment MD Electronically signed by Oneil Parchment MD Signature Date/Time: 12/02/2024/1:05:49 PM    Final    DG CHEST PORT 1 VIEW Result Date: 12/02/2024 CLINICAL DATA:  Provided history: [id number] Encounter for chest tube placement [id number] EXAM: PORTABLE CHEST 1 VIEW COMPARISON:  Radiograph yesterday FINDINGS: Left internal jugular Swan-Ganz catheter tip projects over the right lower lung zone in the region of the inter lobar or lower lobe pulmonary artery. Retraction of approximately 4 cm is recommended for optimal placement. Stable positioning of Impella device from a left-sided approach. Interval extubation and removal of enteric tube. Presumed mediastinal drains. One of these may represent a left-sided chest tube. No other chest tube is demonstrated. Status post median sternotomy with prosthetic cardiac valves. Cardiomegaly is stable. Increasing bilateral pleural effusions and bibasilar opacities. Vascular congestion with question of developing pulmonary edema. No visible pneumothorax. IMPRESSION: 1. Left internal jugular Swan-Ganz catheter tip projects over the right lower lung zone in the region of the interlobar or lower lobe pulmonary artery. Retraction of approximately 4 cm is recommended for optimal placement. 2. Interval extubation and removal of enteric tube. Remaining support apparatus is unchanged. 3. Increasing bilateral pleural effusions and bibasilar opacities. 4. Vascular congestion with question of developing pulmonary edema. Electronically Signed   By: Andrea Gasman M.D.   On: 12/02/2024 10:14     Medications:     Scheduled Medications:  sodium chloride     Intravenous Once   sodium chloride    Intravenous Once   acetaminophen   1,000 mg Oral Q6H   Or   acetaminophen  (TYLENOL ) oral liquid 160 mg/5 mL  1,000 mg Per Tube Q6H   arformoterol   15 mcg Nebulization BID   bisacodyl   10 mg Oral Daily   Or   bisacodyl   10 mg Rectal Daily   Chlorhexidine  Gluconate Cloth  6 each Topical Daily   digoxin   0.125 mg Oral Daily   docusate sodium   200 mg Oral Daily   feeding supplement  237 mL Oral BID BM   folic acid   1 mg Oral Daily   furosemide   40 mg Intravenous BID   insulin  aspart  0-24 Units Subcutaneous TID WC   multivitamin with minerals  1 tablet Oral Daily   mupirocin  ointment  1 Application Nasal BID   pantoprazole   40 mg Oral Daily   potassium chloride   20 mEq Oral TID   revefenacin   175 mcg Nebulization Daily   senna  1 tablet Oral Daily   sodium chloride  flush  3 mL Intravenous Q12H   sorbitol  60 mL Oral Once   thiamine   100 mg Oral Daily    Infusions:  sodium chloride  Stopped (12/02/24 1110)   amiodarone  30 mg/hr (12/03/24 0648)   epinephrine  1 mcg/min (12/03/24 9351)   lactated ringers  10 mL/hr at 12/03/24 0648   milrinone  0.25 mcg/kg/min (12/03/24 0648)   nitroGLYCERIN  Stopped (11/30/24 1615)   sodium bicarbonate  25 mEq (Impella PURGE) in dextrose  5 % 1000 mL bag  PRN Medications: sodium chloride , sodium chloride , albuterol , hydrALAZINE , morphine  injection, ondansetron  (ZOFRAN ) IV, mouth rinse, oxyCODONE , sodium chloride  flush, temazepam , traMADol   Patient Profile  44 y.o. male w/ h/o heavy alcohol use (at least 5 drinks per evening) and h/o asthma admitted w/ acute systolic heart failure. Strong family history of cardiac disease: mother and maternal grandmother had heart failure and father with CAD.    HFrEF. Severe valvular diease.  Assessment/Plan  1.  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  Valvular Disease  - severe MR posteriorly directed, mod TR. Likely functional.  - severe AI.-->CMR - Severe AR/MR  - S/P  AVR/MVR with Impella 5.5 12/8 - Impella P7 Flow 3.7. LDH stable. CO-OX  66%  - Currently on Epi 1 +  Milrinone  0.25 mcg. Plan to stop epi today.   - Will need coumadin when impella out per TCTS  2. Acute Systolic Heart Failure w/ Biventricular Dysfunction  -  Echo EF 34-50%, GIIIDD (restrictive), RV mod reduced, IVC dilated - HS trop not c/w ACS but EKG w/ anteroseptal Qs  - CMRI Severe AR/MR. LVEF 35%  - Cath- normal cors, RA5, PA 32/17 (24), PVR 2.57 Papi 3. CO 4.9 CI 3.8 - L Echo 12/02/24: EF < 20% with severe LVH (new, ?inflammatory), moderately decreased RV function, stable bioprosthetic MV and AoV.  - CVP 6. Some SOB yesterday with activity. Up 13 lbs from pre-op weight. Lasix  80 BID today - Impella @ P7 Flow 3.5  - As above slowly wean Epi, plan for off today.  - Continue digoxin  0.125 mg daily -  Holding GDMT once off pressors.   3. Anemia Expected Blood Loss -12/9 1UPRBC. CT/MT minimal.  Hgb 8.9>7.8 >9.2>7.5  4. Transaminates - Hepatitis panel negative. RUQ US  unremarkable  - suspect 2/2 CHF/hepatic congestion + chronic ETOH use    5.  ETOH Abuse - heavy drinker, at least 5 drinks per evening - may be contributing to CM, reduction of intake imperative  - No evidence of withdrawal   6  Hypomagnesemia - follow; goal >2  - replete   7 . DMII  Hgb A1C 6.1 On SSI.  OOB today. Wean Epi. Consider removing swan and place PICC  CRITICAL CARE Performed by: Beckey LITTIE Coe   Total critical care time: 14 minutes  Critical care time was exclusive of separately billable procedures and treating other patients.  Critical care was necessary to treat or prevent imminent or life-threatening deterioration.  Critical care was time spent personally by me on the following activities: development of treatment plan with patient and/or surrogate as well as nursing, discussions with consultants, evaluation of patient's response to treatment, examination of patient, obtaining history from  patient or surrogate, ordering and performing treatments and interventions, ordering and review of laboratory studies, ordering and review of radiographic studies, pulse oximetry and re-evaluation of patient's condition.    Length of Stay: 10  Beckey LITTIE Coe, NP  12/03/2024, 7:19 AM  Advanced Heart Failure Team Pager [phone] (M-F; 7a - 5p)  Please contact [facility] for night-coverage after hours (5p -7a ) and weekends on [email or web address]  Patient seen with NP, I formulated the plan and agree with the above note.    Patient is on epinephrine  1 + milrinone  0.25 + Impella P7 (flow 3.6 L/min) with stable MAP and CI 3.1 by continuous cardiac output Swan, co-ox 66%. CVP 13 on my read.  I/Os net negative 922 cc yesterday with Lasix  40 mg IV bid.    He  feels good, ready to walk.    Post-op echo showed EF < 20% with severe LVH (new, ?inflammatory), moderately decreased RV function, stable bioprosthetic MV and AoV.    General: NAD Neck: JVP 12 cm, no thyromegaly or thyroid nodule.  Lungs: Decreased BS at bases.  CV: Nondisplaced PMI.  Heart regular S1/S2, no S3/S4, 1/6 SEM RUSB.  No peripheral edema.    Abdomen: Soft, nontender, no hepatosplenomegaly, no distention.  Skin: Intact without lesions or rashes.  Neurologic: Alert and oriented x 3.  Psych: Normal affect. Extremities: No clubbing or cyanosis.  HEENT: Normal.   Stop epinephrine  today, continue current milrinone  and will leave Impella at P7.  Check Impella position ok by echo yesterday. Hopefully start to wean Impella tomorrow after a bit more diuresis.  LDH 290 => 344 => 341, plts 98 => 89 => 82.  Not on heparin  yet, just HCO3 purge. Continue digoxin  0.125 daily.   With CVP 13, will give Lasix  80 mg IV bid today.  Creatinine stable at 0.69.    Hgb 7.5 today, transfuse if drops any further.    On amiodarone  gtt with NSVT episodes.    Ambulate today.    CRITICAL CARE Performed by: Ezra Shuck  Total critical care time: 40  minutes  Critical care time was exclusive of separately billable procedures and treating other patients.  Critical care was necessary to treat or prevent imminent or life-threatening deterioration.  Critical care was time spent personally by me on the following activities: development of treatment plan with patient and/or surrogate as well as nursing, discussions with consultants, evaluation of patient's response to treatment, examination of patient, obtaining history from patient or surrogate, ordering and performing treatments and interventions, ordering and review of laboratory studies, ordering and review of radiographic studies, pulse oximetry and re-evaluation of patient's condition.  Ezra Shuck 12/03/2024 8:05 AM

## 2024-12-03 NOTE — Procedures (Signed)
 Insertion of Chest Tube Procedure Note  Alejandro Lopez  [id number]  13-May-1981  Date:12/03/2024  Time:8:51 PM    Provider Performing: Con RAMAN Gerell Fortson   Procedure: Pleural Catheter Insertion w/o Imaging Guidance (67443)  Indication(s) Hemothorax  Consent Unable to obtain consent due to emergent nature of procedure.  Anesthesia Topical only with 1% lidocaine     Time Out Verified patient identification, verified procedure, site/side was marked, verified correct patient position, special equipment/implants available, medications/allergies/relevant history reviewed, required imaging and test results available.   Sterile Technique Maximal sterile technique including full sterile barrier drape, hand hygiene, sterile gown, sterile gloves, mask, hair covering, sterile ultrasound probe cover (if used).   Procedure Description Ultrasound not used to identify appropriate pleural anatomy for placement and overlying skin marked. Area of placement cleaned and draped in sterile fashion.  A 14 French pigtail pleural catheter was placed into the right pleural space using Seldinger technique. Appropriate return of blood was obtained.  The tube was connected to atrium and placed on -10 cm H2O wall suction.   Complications/Tolerance None; patient tolerated the procedure well. Chest X-ray is ordered to verify placement.   EBL 2.5L of dark blood  Specimen(s) none

## 2024-12-03 NOTE — Progress Notes (Signed)
 TCTS PM Rounding Progress Note  S/p thoracentesis today with 1L, thin dark red output.  Reports significant pain on right chest afterwards Main complaint is having thick phlegm he can't cough up due to pain from thoracentesis Walked multiple times today CXR this afternoon with lots of gas in the colon - no BM yet despite sorbitol.  Passing gas, denies nausea, no abdominal pain Epi off, hemodynamics remain stable  Vitals:   12/03/24 1559 12/03/24 1600  BP:    Pulse: 94 96  Resp: 19 (!) 22  Temp: 97.7 F (36.5 C) 97.7 F (36.5 C)  SpO2: 98% 99%   Exam: Resting in chair No swelling or crepitus along area of pain Abd mildly distended but soft, non-tender  Plan: - Lidocaine  patch for right chest - Saline neb to help loosen/thin neb - Encourage ambulation  Con Clunes, MD Cardiothoracic Surgery Pager: [phone]

## 2024-12-03 NOTE — Consult Note (Signed)
 Chief Complaint: Patient was seen in consultation today for active bleeding from right intercostal artery  Referring Physician(s): Con Clunes, MD  Patient Status: [facility] - In-pt  History of Present Illness: Alejandro Lopez is a 43 y.o. male with CHF and s/p AVR, MVR, Impella placement on 11/30/24.  Drop in hemoglobin after right thoracentesis today and found to have large right hemothorax.  Right chest tube placement by CT surgery and CTA demonstrates active contrast extravasation from a right intercostal artery.  In addition, CTA demonstrates bowel pneumatosis and portal venous gas concerning for bowel ischemia.  Surgery has been consulted and plans to take to OR after IR intervention.   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   Procedure: REPLACEMENT, MITRAL VALVE WITH MITRIS RESILIA MITRAL VALVE 27mm;  Surgeon: Lucas Dorise POUR, MD;  Location: [facility] OR;  Service: Open Heart Surgery;  Laterality: N/A;   PLACEMENT OF IMPELLA LEFT VENTRICULAR ASSIST DEVICE N/A 11/30/2024   Procedure: INSERTION, CARDIAC ASSIST DEVICE, IMPELLA;  Surgeon: Lucas Dorise POUR, MD;  Location: [facility] OR;  Service: Open Heart Surgery;  Laterality: N/A;  IMPELLA 5.5 INSERTION   RIGHT HEART CATH AND CORONARY ANGIOGRAPHY N/A 11/27/2024   Procedure: RIGHT HEART CATH AND CORONARY ANGIOGRAPHY;  Surgeon: Zenaida Morene PARAS, MD;  Location: [facility] INVASIVE CV LAB;  Service:  Cardiovascular;  Laterality: N/A;   TRANSESOPHAGEAL ECHOCARDIOGRAM (CATH LAB) N/A 11/27/2024   Procedure: TRANSESOPHAGEAL ECHOCARDIOGRAM;  Surgeon: Zenaida Morene PARAS, MD;  Location: [facility] INVASIVE CV LAB;  Service: Cardiovascular;  Laterality: N/A;    Allergies: Patient has no known allergies.  Medications: Prior to Admission medications  Medication Sig Start Date End Date Taking? Authorizing Provider  albuterol  (VENTOLIN  HFA) 108 (90 Base) MCG/ACT inhaler Inhale 2 puffs into the lungs every 4 (four) hours as needed. 10/14/23  Yes Leath-Warren, Etta PARAS, NP  guaiFENesin (MUCINEX) 600 MG 12 hr tablet Take 1,200 mg by mouth 2 (two) times daily as needed for to loosen phlegm or cough.   Yes [provider]  ibuprofen  (ADVIL ) 800 MG tablet Take 1 tablet (800 mg total) by mouth every 8 (eight) hours as needed for moderate pain. 11/07/22  Yes Suzette Pac, MD  azithromycin  (ZITHROMAX ) 250 MG tablet Take 250 mg by mouth as directed. Patient not taking: Reported on 11/23/2024 11/14/24   [provider]     Family History  Problem Relation Age of Onset   Stroke Mother    Hypertension Mother    Diabetes Mother     Social History   Socioeconomic History   Marital status: Married    Spouse name: Not on file   Number of children: 3   Years of education: Not on file   Highest education level: 9th grade  Occupational History   Not on file  Tobacco Use   Smoking status: Every Day    Current packs/day: 1.00    Types: Cigarettes   Smokeless tobacco: Never  Vaping Use   Vaping status: Never Used  Substance and Sexual Activity   Alcohol use: Yes    Alcohol/week: 4.0 standard drinks of alcohol    Types: 4 Shots of liquor per week    Comment: every day    Drug use: Yes    Types: Marijuana   Sexual activity: Not on file  Other Topics Concern   Not on file  Social History Narrative   Not on file   Social Drivers of Health   Tobacco Use: High Risk (11/30/2024)    Patient History    Smoking Tobacco Use: Every Day    Smokeless Tobacco Use: Never    Passive Exposure: Not on file  Financial Resource Strain: Not on file  Food Insecurity: No Food Insecurity (11/22/2024)   Epic    Worried About Programme Researcher, Broadcasting/film/video in the Last Year: Never true    Ran Out of Food in the Last Year: Never true  Transportation Needs: No Transportation Needs (11/22/2024)   Epic    Lack of Transportation (Medical): No    Lack of Transportation (Non-Medical): No  Physical Activity: Not on file  Stress: Not on file  Social Connections: Unknown (05/06/2022)   Received from [facility]   Social Network    Social Network: Not on file  Depression (PHQ2-9): Not on file  Alcohol Screen: Not on file  Housing: Low Risk (11/22/2024)   Epic    Unable to Pay for Housing in the Last Year: No    Number of Times Moved in the Last Year: 0    Homeless in the Last Year: No  Utilities: Not At Risk (11/22/2024)   Epic    Threatened with loss of utilities: No  Health Literacy: Not on file     Review of Systems  Vital Signs: BP 101/86   Pulse (!) 112   Temp (!) 94.5 F (34.7 C)   Resp (!) 24   Ht 5' 10 (1.778 m)   Wt 75.9 kg   SpO2 100%   BMI 24.01 kg/m     Physical Exam Cardiovascular:     Rate and Rhythm: Tachycardia present.     Comments: Doppler signal in bilateral DP and PT  Pulmonary:     Comments: Right chest tube, bloody output Abdominal:     General: There is distension.  Musculoskeletal:     Right lower leg: No edema.     Left lower leg: No edema.  Neurological:     Mental Status: He is alert.     Imaging: EXAM: CT CHEST, ABDOMEN AND PELVIS WITH AND WITHOUT CONTRAST 12/03/2024 09:52:07 PM   TECHNIQUE: CT of the chest, abdomen and pelvis was performed with and without the administration of intravenous contrast. Multiplanar reformatted images are provided for review. Automated exposure control, iterative reconstruction, and/or weight based  adjustment of the mA/kV was utilized to reduce the radiation dose to as low as reasonably achievable.   COMPARISON: None available.   CLINICAL HISTORY: Acute aortic syndrome (AAS) suspected; Evaluate for intercostal bleeding or other sources after thoracentesis; also evaluate bowel.   FINDINGS:   CHEST: MEDIASTINUM AND LYMPH NODES: Mild cardiomegaly. Impella device tip in the left ventricle. Swan-ganz catheter tip in the central right pulmonary artery. No evidence of aortic aneurysm or dissection. The central airways are clear. No mediastinal, hilar or axillary lymphadenopathy.   LUNGS AND PLEURA: Right chest tube in place. Moderate to large right pleural effusion with compressive atelectasis in the right middle  lobe and right lower lobe. There appears to be active extravasation of contrast posteriorly between the right 9th and 10th ribs and likely into the pleural space, possibly related to prior thoracentesis. The pleural fluid appears complex on the right, likely hemothorax. Small left pleural effusion with compressive atelectasis in the left lower lobe. Tiny right pneumothorax. Biapical subpleural blebs. No evidence of pulmonary embolus.   ABDOMEN AND PELVIS: LIVER: Gas is seen branching peripherally in the liver compatible with portal venous gas.   GALLBLADDER AND BILE DUCTS: Gallbladder is unremarkable. No biliary ductal dilatation.   SPLEEN: No acute abnormality.   PANCREAS: No acute abnormality.   ADRENAL GLANDS: No acute abnormality.   KIDNEYS, URETERS AND BLADDER: No stones in the kidneys or ureters. No hydronephrosis. No perinephric or periureteral stranding. Urinary bladder is unremarkable.   GI AND BOWEL: Stomach and small bowel are decompressed. Diffuse gaseous distention of the colon suggests ileus. Moderate stool burden in the rectosigmoid colon. Pneumatosis noted in small bowel loops in the lower pelvis with air in the mesenteric veins feeding  these small bowel loops. Pneumatosis also noted in the right colon wall.   REPRODUCTIVE ORGANS: No acute abnormality.   PERITONEUM AND RETROPERITONEUM: No ascites. No free air.   VASCULATURE: Aorta is normal in caliber.   ABDOMINAL AND PELVIS LYMPH NODES: No lymphadenopathy.   BONES AND SOFT TISSUES: No acute osseous abnormality. No focal soft tissue abnormality.   IMPRESSION: 1. Active contrast extravasation between the right 9th and 10th ribs with likely extension into the pleural space, suspicious for ongoing intercostal bleeding likely related to recent thoracentesis. 2. Moderate to large right pleural effusion compatible with hemothorax with compressive atelectasis in the right middle and lower lobes; right chest tube present; tiny right pneumothorax. 3. No evidence of aortic aneurysm, aortic dissection, or pulmonary embolus. 4. Pneumatosis involving small bowel loops in the lower pelvis and the right colon with mesenteric venous gas and portal venous gas, concerning for bowel ischemia. Recommend surgical consultation. 5. Small left pleural effusion with compressive atelectasis in the left lower lobe. 6. . Attempts are being made to contact the ordering physician with the results. An addendum will be made at that time.   Electronically signed by: Franky Crease MD 12/03/2024 10:09 PM EST RP Workstation: [id number]  Labs:  CBC: Recent Labs    12/02/24 0522 12/02/24 0526 12/03/24 0506 12/03/24 1516 12/03/24 1807 12/03/24 1855 12/03/24 2123 12/03/24 2214  WBC 15.6*  --  14.6* 13.8*  --   --   --  10.1  HGB 7.8*   < > 7.5* 8.0* 5.4* 5.1* 6.8* 5.5*  HCT 21.8*   < > 21.5* 22.7* 15.7* 15.0* 20.0* 15.9*  PLT 89*  --  82* 95*  --   --   --  98*  98*   < > = values in this interval not displayed.    COAGS: Recent Labs    11/29/24 1629 11/30/24 1626 11/30/24 1950 12/03/24 2214  INR 1.0 1.6* 1.3* PENDING  APTT  --  39*  --  PENDING    BMP: Recent Labs     12/01/24 1457 12/02/24 0522 12/02/24 0526 12/03/24 0506 12/03/24 1807 12/03/24 1855 12/03/24 2123  NA 135 133*   < > 129* 131* 131* 132*  K 4.4 4.8   < > 4.0 4.1 4.0 4.2  CL 102 101  --  96* 97*  --   --   CO2 26 24  --  27 25  --   --  GLUCOSE 108* 169*  --  92 145*  --   --   BUN 13 12  --  16 19  --   --   CALCIUM  8.0* 8.3*  --  8.1* 8.3*  --   --   CREATININE 0.85 0.92  --  0.69 1.04  --   --   GFRNONAA >60 >60  --  >60 >60  --   --    < > = values in this interval not displayed.    LIVER FUNCTION TESTS: Recent Labs    11/24/24 0510 11/25/24 0305 11/29/24 1629 12/02/24 0522  BILITOT 1.0 0.8 0.9 1.1  AST 47* 41 63* 68*  ALT 74* 64* 66* 28  ALKPHOS 145* 141* 127* 49  PROT 6.1* 5.9* 6.7 4.9*  ALBUMIN  3.4* 3.3* 3.1* 2.4*    TUMOR MARKERS: No results for input(s): AFPTM, CEA, CA199, CHROMGRNA in the last 8760 hours.  Assessment and Plan:  43 yo with bleeding from a right intercostal artery after thoracentesis.  Right chest tube has already been placed and evidence for persistent bleeding based on CTA.  In addition, patient has evidence for bowel ischemia and will need emergent surgery.  Plan for emergent arteriography and embolization of right intercostal artery bleed.  Plan for OR after IR intervention.   Catheter directed arteriography with possible embolization of a right intercostal artery was explained to patient and wife.  Risks include bleeding, infection, vascular injury and unsuccessful embolization. Patient is awake and verbally agrees to the IR procedure.  Informed consent obtained from patient's wife at the bedside.     Electronically Signed: Juliene JONELLE Balder, MD 12/03/2024, 11:09 PM   I spent a total of 20 Minutes    in face to face in clinical consultation, greater than 50% of which was counseling/coordinating care for intercostal bleed and hemothorax.

## 2024-12-03 NOTE — Progress Notes (Signed)
 3 Days Post-Op Procedures (LRB): REPLACEMENT, AORTIC VALVE, OPEN WITH INSPIRIS RESILIA AORTIC VALVE 23mm (N/A) REPLACEMENT, MITRAL VALVE WITH MITRIS RESILIA MITRAL VALVE 27mm (N/A) INSERTION, CARDIAC ASSIST DEVICE, IMPELLA (N/A) ECHOCARDIOGRAM, TRANSESOPHAGEAL, INTRAOPERATIVE (N/A) Subjective:  No specific complaints. Did not sleep according to nurse but has had activity in room all night.  No SOB. Pain under good control.  Objective: Vital signs in last 24 hours: Temp:  [97.2 F (36.2 C)-98.2 F (36.8 C)] 97.7 F (36.5 C) (12/11 0645) Pulse Rate:  [89-110] 92 (12/11 0645) Cardiac Rhythm: Normal sinus rhythm (12/11 0400) Resp:  [0-32] 0 (12/11 0645) BP: (108-138)/(81-109) 111/90 (12/11 0600) SpO2:  [89 %-99 %] 93 % (12/11 0645) Arterial Line BP: (88-155)/(63-118) 129/80 (12/11 0645) FiO2 (%):  [28 %-32 %] 32 % (12/10 1916) Weight:  [75.9 kg] 75.9 kg (12/11 0500)  Hemodynamic parameters for last 24 hours: PAP: (42-92)/(10-49) 50/16 CVP:  [0 mmHg-41 mmHg] 5 mmHg CO:  [4.6 L/min-7.9 L/min] 6.9 L/min CI:  [2.5 L/min/m2-4.2 L/min/m2] 3.7 L/min/m2  Intake/Output from previous day: 12/10 0701 - 12/11 0700 In: 1420.6 [I.V.:1017.1; Blood:150] Out: 2355 [Urine:2355] Intake/Output this shift: Total I/O In: 657.3 [I.V.:536.3; Other:121] Out: 1055 [Urine:1055]  General appearance: alert and cooperative Neurologic: intact Heart: regular rate and rhythm Lungs: diminished breath sounds bibasilar Extremities: no edema Wound: dressings dry  Lab Results: Recent Labs    12/02/24 0522 12/02/24 0526 12/03/24 0408 12/03/24 0506  WBC 15.6*  --   --  14.6*  HGB 7.8*   < > 9.2* 7.5*  HCT 21.8*   < > 27.0* 21.5*  PLT 89*  --   --  82*   < > = values in this interval not displayed.   BMET:  Recent Labs    12/02/24 0522 12/02/24 0526 12/03/24 0408 12/03/24 0506  NA 133*   < > 131* 129*  K 4.8   < > 4.1 4.0  CL 101  --   --  96*  CO2 24  --   --  27  GLUCOSE 169*  --   --   92  BUN 12  --   --  16  CREATININE 0.92  --   --  0.69  CALCIUM  8.3*  --   --  8.1*   < > = values in this interval not displayed.    PT/INR:  Recent Labs    11/30/24 1950  LABPROT 17.2*  INR 1.3*   ABG    Component Value Date/Time   PHART 7.474 (H) 12/03/2024 0408   HCO3 26.8 12/03/2024 0408   TCO2 28 12/03/2024 0408   ACIDBASEDEF 1.0 12/02/2024 0526   O2SAT 71.5 12/03/2024 0505   CBG (last 3)  Recent Labs    12/02/24 2002 12/02/24 2304 12/03/24 0505  GLUCAP 108* 121* 99   CXR: increased right pleural effusion and bibasilar atelectasis, R>L.  Assessment/Plan: S/P Procedures (LRB): REPLACEMENT, AORTIC VALVE, OPEN WITH INSPIRIS RESILIA AORTIC VALVE 23mm (N/A) REPLACEMENT, MITRAL VALVE WITH MITRIS RESILIA MITRAL VALVE 27mm (N/A) INSERTION, CARDIAC ASSIST DEVICE, IMPELLA (N/A) ECHOCARDIOGRAM, TRANSESOPHAGEAL, INTRAOPERATIVE (N/A)  POD 3  Hemodynamically stable on milrinone  0.25 and epi 1. Impella P7. Co-ox 71.5 this am, CI 3.7. CVP 5 up in chair. Wean inotropes per AHF team. Will need to decide on timing of Impella removal.   -934 cc yesterday. Wt down 1 lb. Still 13 lbs over his preop wt if accurate. He has no significant peripheral edema. Continue diuresis today.  Glucose under good control. Change to  SSI TID.  Increased right pleural effusion. Hopefully diuresis will resolve this.   Acute blood loss anemia: transfused cells are not going to last long and he gets a lot of blood draws.  Thrombocytopenia: follow. No heparin  or ASA.  Eventually will plan Coumadin for double valve once Impella is out.  Continue IS, mobilization.    LOS: 10 days    Dorise MARLA Fellers 12/03/2024

## 2024-12-04 ENCOUNTER — Inpatient Hospital Stay (HOSPITAL_COMMUNITY)

## 2024-12-04 ENCOUNTER — Encounter (HOSPITAL_COMMUNITY): Payer: Self-pay | Admitting: General Surgery

## 2024-12-04 ENCOUNTER — Inpatient Hospital Stay (HOSPITAL_COMMUNITY): Admitting: Certified Registered Nurse Anesthetist

## 2024-12-04 ENCOUNTER — Other Ambulatory Visit: Payer: Self-pay

## 2024-12-04 ENCOUNTER — Telehealth (HOSPITAL_COMMUNITY): Payer: Self-pay

## 2024-12-04 DIAGNOSIS — I5021 Acute systolic (congestive) heart failure: Secondary | ICD-10-CM | POA: Diagnosis not present

## 2024-12-04 HISTORY — PX: IR ANGIOGRAM VISCERAL SELECTIVE: IMG657

## 2024-12-04 HISTORY — PX: IR US GUIDE VASC ACCESS RIGHT: IMG2390

## 2024-12-04 HISTORY — PX: IR ANGIOGRAM FOLLOW UP STUDY: IMG697

## 2024-12-04 HISTORY — PX: IR ANGIOGRAM SELECTIVE EACH ADDITIONAL VESSEL: IMG667

## 2024-12-04 HISTORY — PX: IR EMBO ART  VEN HEMORR LYMPH EXTRAV  INC GUIDE ROADMAPPING: IMG5450

## 2024-12-04 LAB — CBC
HCT: 28.6 % — ABNORMAL LOW (ref 39.0–52.0)
HCT: 16.3 % — ABNORMAL LOW (ref 39.0–52.0)
HCT: 28.1 % — ABNORMAL LOW (ref 39.0–52.0)
Hemoglobin: 10 g/dL — ABNORMAL LOW (ref 13.0–17.0)
Hemoglobin: 5.9 g/dL — CL (ref 13.0–17.0)
Hemoglobin: 9.9 g/dL — ABNORMAL LOW (ref 13.0–17.0)
MCH: 28.9 pg (ref 26.0–34.0)
MCH: 29.1 pg (ref 26.0–34.0)
MCH: 30 pg (ref 26.0–34.0)
MCHC: 35 g/dL (ref 30.0–36.0)
MCHC: 36.2 g/dL — ABNORMAL HIGH (ref 30.0–36.0)
MCHC: 35.2 g/dL (ref 30.0–36.0)
MCV: 82.7 fL (ref 80.0–100.0)
MCV: 80.3 fL (ref 80.0–100.0)
MCV: 85.2 fL (ref 80.0–100.0)
Platelets: 71 K/uL — ABNORMAL LOW (ref 150–400)
Platelets: 58 K/uL — ABNORMAL LOW (ref 150–400)
Platelets: 92 K/uL — ABNORMAL LOW (ref 150–400)
RBC: 3.46 MIL/uL — ABNORMAL LOW (ref 4.22–5.81)
RBC: 2.03 MIL/uL — ABNORMAL LOW (ref 4.22–5.81)
RBC: 3.3 MIL/uL — ABNORMAL LOW (ref 4.22–5.81)
RDW: 16 % — ABNORMAL HIGH (ref 11.5–15.5)
RDW: 17 % — ABNORMAL HIGH (ref 11.5–15.5)
RDW: 17.3 % — ABNORMAL HIGH (ref 11.5–15.5)
WBC: 5.4 K/uL (ref 4.0–10.5)
WBC: 5.2 K/uL (ref 4.0–10.5)
WBC: 7.4 K/uL (ref 4.0–10.5)
nRBC: 1.7 % — ABNORMAL HIGH (ref 0.0–0.2)
nRBC: 10.1 % — ABNORMAL HIGH (ref 0.0–0.2)
nRBC: 7.5 % — ABNORMAL HIGH (ref 0.0–0.2)

## 2024-12-04 LAB — ECHOCARDIOGRAM LIMITED
Height: 70 in
Weight: 2677.27 [oz_av]

## 2024-12-04 LAB — BASIC METABOLIC PANEL WITH GFR
Anion gap: 13 (ref 5–15)
Anion gap: 10 (ref 5–15)
BUN: 23 mg/dL — ABNORMAL HIGH (ref 6–20)
BUN: 27 mg/dL — ABNORMAL HIGH (ref 6–20)
CO2: 20 mmol/L — ABNORMAL LOW (ref 22–32)
CO2: 22 mmol/L (ref 22–32)
Calcium: 7.4 mg/dL — ABNORMAL LOW (ref 8.9–10.3)
Calcium: 7.6 mg/dL — ABNORMAL LOW (ref 8.9–10.3)
Chloride: 99 mmol/L (ref 98–111)
Chloride: 99 mmol/L (ref 98–111)
Creatinine, Ser: 1.28 mg/dL — ABNORMAL HIGH (ref 0.61–1.24)
Creatinine, Ser: 1.98 mg/dL — ABNORMAL HIGH (ref 0.61–1.24)
GFR, Estimated: >60 mL/min (ref >60)
GFR, Estimated: 42 mL/min — ABNORMAL LOW (ref >60)
Glucose, Bld: 207 mg/dL — ABNORMAL HIGH (ref 70–99)
Glucose, Bld: 113 mg/dL — ABNORMAL HIGH (ref 70–99)
Potassium: 4.2 mmol/L (ref 3.5–5.1)
Potassium: 4.3 mmol/L (ref 3.5–5.1)
Sodium: 132 mmol/L — ABNORMAL LOW (ref 135–145)
Sodium: 131 mmol/L — ABNORMAL LOW (ref 135–145)

## 2024-12-04 LAB — DIC (DISSEMINATED INTRAVASCULAR COAGULATION)PANEL
D-Dimer, Quant: 2.18 ug{FEU}/mL — ABNORMAL HIGH (ref 0.00–0.50)
D-Dimer, Quant: 2.32 ug{FEU}/mL — ABNORMAL HIGH (ref 0.00–0.50)
Fibrinogen: 281 mg/dL (ref 210–475)
Fibrinogen: 187 mg/dL — ABNORMAL LOW (ref 210–475)
INR: 1.6 — ABNORMAL HIGH (ref 0.8–1.2)
INR: 1.6 — ABNORMAL HIGH (ref 0.8–1.2)
Platelets: 98 K/uL — ABNORMAL LOW (ref 150–400)
Platelets: 71 K/uL — ABNORMAL LOW (ref 150–400)
Prothrombin Time: 20.2 s — ABNORMAL HIGH (ref 11.4–15.2)
Prothrombin Time: 20.1 s — ABNORMAL HIGH (ref 11.4–15.2)
Smear Review: NONE SEEN
Smear Review: NONE SEEN
aPTT: 46 s — ABNORMAL HIGH (ref 24–36)
aPTT: 38 s — ABNORMAL HIGH (ref 24–36)

## 2024-12-04 LAB — PHOSPHORUS: Phosphorus: 7.2 mg/dL — ABNORMAL HIGH (ref 2.5–4.6)

## 2024-12-04 LAB — GLUCOSE, CAPILLARY
Glucose-Capillary: 205 mg/dL — ABNORMAL HIGH (ref 70–99)
Glucose-Capillary: 148 mg/dL — ABNORMAL HIGH (ref 70–99)
Glucose-Capillary: 109 mg/dL — ABNORMAL HIGH (ref 70–99)
Glucose-Capillary: 111 mg/dL — ABNORMAL HIGH (ref 70–99)
Glucose-Capillary: 122 mg/dL — ABNORMAL HIGH (ref 70–99)

## 2024-12-04 LAB — POCT I-STAT 7, (LYTES, BLD GAS, ICA,H+H)
Acid-base deficit: 8 mmol/L — ABNORMAL HIGH (ref 0.0–2.0)
Acid-base deficit: 6 mmol/L — ABNORMAL HIGH (ref 0.0–2.0)
Bicarbonate: 18.5 mmol/L — ABNORMAL LOW (ref 20.0–28.0)
Bicarbonate: 20.8 mmol/L (ref 20.0–28.0)
Calcium, Ion: 0.95 mmol/L — ABNORMAL LOW (ref 1.15–1.40)
Calcium, Ion: 1.09 mmol/L — ABNORMAL LOW (ref 1.15–1.40)
HCT: 15 % — ABNORMAL LOW (ref 39.0–52.0)
HCT: 28 % — ABNORMAL LOW (ref 39.0–52.0)
Hemoglobin: 5.1 g/dL — CL (ref 13.0–17.0)
Hemoglobin: 9.5 g/dL — ABNORMAL LOW (ref 13.0–17.0)
O2 Saturation: 95 %
O2 Saturation: 98 %
Patient temperature: 34.2
Potassium: 4.1 mmol/L (ref 3.5–5.1)
Potassium: 4.3 mmol/L (ref 3.5–5.1)
Sodium: 134 mmol/L — ABNORMAL LOW (ref 135–145)
Sodium: 133 mmol/L — ABNORMAL LOW (ref 135–145)
TCO2: 20 mmol/L — ABNORMAL LOW (ref 22–32)
TCO2: 22 mmol/L (ref 22–32)
pCO2 arterial: 43.1 mmHg (ref 32–48)
pCO2 arterial: 38.9 mmHg (ref 32–48)
pH, Arterial: 7.24 — ABNORMAL LOW (ref 7.35–7.45)
pH, Arterial: 7.322 — ABNORMAL LOW (ref 7.35–7.45)
pO2, Arterial: 88 mmHg (ref 83–108)
pO2, Arterial: 93 mmHg (ref 83–108)

## 2024-12-04 LAB — LACTIC ACID, PLASMA
Lactic Acid, Venous: 3.1 mmol/L (ref 0.5–1.9)
Lactic Acid, Venous: 2.4 mmol/L (ref 0.5–1.9)
Lactic Acid, Venous: 2.2 mmol/L (ref 0.5–1.9)

## 2024-12-04 LAB — BPAM PLATELET PHERESIS
Blood Product Expiration Date: 202512132359
Blood Product Expiration Date: 202512132359
ISSUE DATE / TIME: 202512111931
ISSUE DATE / TIME: 202512111931
Unit Type and Rh: 5100
Unit Type and Rh: 5100

## 2024-12-04 LAB — CG4 I-STAT (LACTIC ACID): Lactic Acid, Venous: 4.7 mmol/L (ref 0.5–1.9)

## 2024-12-04 LAB — PREPARE PLATELET PHERESIS
Unit division: 0
Unit division: 0

## 2024-12-04 LAB — PROTIME-INR
INR: 1.5 — ABNORMAL HIGH (ref 0.8–1.2)
INR: 1.4 — ABNORMAL HIGH (ref 0.8–1.2)
Prothrombin Time: 19.2 s — ABNORMAL HIGH (ref 11.4–15.2)
Prothrombin Time: 17.4 s — ABNORMAL HIGH (ref 11.4–15.2)

## 2024-12-04 LAB — HEMOGLOBIN AND HEMATOCRIT, BLOOD
HCT: 29.6 % — ABNORMAL LOW (ref 39.0–52.0)
Hemoglobin: 10.4 g/dL — ABNORMAL LOW (ref 13.0–17.0)

## 2024-12-04 LAB — MAGNESIUM: Magnesium: 2 mg/dL (ref 1.7–2.4)

## 2024-12-04 LAB — PREPARE RBC (CROSSMATCH)

## 2024-12-04 LAB — FIBRINOGEN: Fibrinogen: 542 mg/dL — ABNORMAL HIGH (ref 210–475)

## 2024-12-04 LAB — LACTATE DEHYDROGENASE: LDH: 436 U/L — ABNORMAL HIGH (ref 105–235)

## 2024-12-04 LAB — COOXEMETRY PANEL
Carboxyhemoglobin: 1.8 % — ABNORMAL HIGH (ref 0.5–1.5)
Methemoglobin: <0.7 % (ref 0.0–1.5)
O2 Saturation: 62.8 %
Total hemoglobin: 10.1 g/dL — ABNORMAL LOW (ref 12.0–16.0)

## 2024-12-04 LAB — PATHOLOGIST SMEAR REVIEW

## 2024-12-04 MED ORDER — ORAL CARE MOUTH RINSE
15.0000 mL | OROMUCOSAL | Status: DC
  Administered 2024-12-04 – 2024-12-12 (×99): 15 mL via OROMUCOSAL

## 2024-12-04 MED ORDER — FENTANYL 2500MCG IN NS 250ML (10MCG/ML) PREMIX INFUSION
0.0000 ug/h | INTRAVENOUS | Status: DC
  Administered 2024-12-04: 50 ug/h via INTRAVENOUS
  Administered 2024-12-05 – 2024-12-07 (×3): 100 ug/h via INTRAVENOUS
  Administered 2024-12-08 (×2): 150 ug/h via INTRAVENOUS
  Administered 2024-12-09: 07:00:00 100 ug/h via INTRAVENOUS
  Administered 2024-12-10 (×2): 175 ug/h via INTRAVENOUS
  Filled 2024-12-04 (×10): qty 250

## 2024-12-04 MED ORDER — ETOMIDATE 2 MG/ML IV SOLN
INTRAVENOUS | Status: AC
  Filled 2024-12-04: qty 10

## 2024-12-04 MED ORDER — TRAVASOL 10 % IV SOLN
INTRAVENOUS | Status: AC
  Filled 2024-12-04: qty 630

## 2024-12-04 MED ORDER — ALBUMIN HUMAN 5 % IV SOLN: 12.5000 g | Freq: Once | INTRAVENOUS | Status: AC

## 2024-12-04 MED ORDER — PANTOPRAZOLE SODIUM 40 MG IV SOLR
40.0000 mg | Freq: Every day | INTRAVENOUS | Status: DC
  Administered 2024-12-04 – 2024-12-28 (×25): 40 mg via INTRAVENOUS
  Filled 2024-12-04 (×18): qty 10

## 2024-12-04 MED ORDER — FOLIC ACID 5 MG/ML IJ SOLN
1.0000 mg | Freq: Every day | INTRAMUSCULAR | Status: DC
  Administered 2024-12-04 – 2024-12-11 (×8): 1 mg via INTRAVENOUS
  Filled 2024-12-04 (×10): qty 0.2

## 2024-12-04 MED ORDER — SODIUM CHLORIDE 0.9% IV SOLUTION: Freq: Once | INTRAVENOUS | Status: AC

## 2024-12-04 MED ORDER — CALCIUM GLUCONATE-NACL 2-0.675 GM/100ML-% IV SOLN
2.0000 g | Freq: Once | INTRAVENOUS | Status: AC
  Administered 2024-12-04: 2000 mg via INTRAVENOUS
  Filled 2024-12-04: qty 100

## 2024-12-04 MED ORDER — ALBUMIN HUMAN 5 % IV SOLN
INTRAVENOUS | Status: AC
  Administered 2024-12-04: 12.5 g via INTRAVENOUS
  Filled 2024-12-04: qty 500

## 2024-12-04 MED ORDER — REVEFENACIN 175 MCG/3ML IN SOLN
175.0000 ug | Freq: Every day | RESPIRATORY_TRACT | Status: DC
  Administered 2024-12-05 – 2024-12-28 (×23): 175 ug via RESPIRATORY_TRACT
  Filled 2024-12-04 (×16): qty 3

## 2024-12-04 MED ORDER — LIDOCAINE 2% (20 MG/ML) 5 ML SYRINGE
INTRAMUSCULAR | Status: DC | PRN
  Administered 2024-12-04: 60 mg via INTRAVENOUS

## 2024-12-04 MED ORDER — SUCCINYLCHOLINE CHLORIDE 200 MG/10ML IV SOSY
PREFILLED_SYRINGE | INTRAVENOUS | Status: DC | PRN
  Administered 2024-12-04: 80 mg via INTRAVENOUS

## 2024-12-04 MED ORDER — FENTANYL CITRATE (PF) 250 MCG/5ML IJ SOLN
INTRAMUSCULAR | Status: DC | PRN
  Administered 2024-12-04: 150 ug via INTRAVENOUS

## 2024-12-04 MED ORDER — ALBUMIN HUMAN 5 % IV SOLN
INTRAVENOUS | Status: AC
  Administered 2024-12-04: 12.5 g via INTRAVENOUS
  Filled 2024-12-04: qty 250

## 2024-12-04 MED ORDER — ARFORMOTEROL TARTRATE 15 MCG/2ML IN NEBU
15.0000 ug | INHALATION_SOLUTION | Freq: Two times a day (BID) | RESPIRATORY_TRACT | Status: DC
  Administered 2024-12-04 – 2024-12-28 (×46): 15 ug via RESPIRATORY_TRACT
  Filled 2024-12-04 (×32): qty 2

## 2024-12-04 MED ORDER — FENTANYL BOLUS VIA INFUSION
25.0000 ug | INTRAVENOUS | Status: DC | PRN
  Administered 2024-12-04 – 2024-12-05 (×5): 25 ug via INTRAVENOUS
  Administered 2024-12-05 (×2): 100 ug via INTRAVENOUS
  Administered 2024-12-06 (×2): 50 ug via INTRAVENOUS
  Administered 2024-12-06 – 2024-12-07 (×2): 100 ug via INTRAVENOUS
  Administered 2024-12-07: 13:00:00 50 ug via INTRAVENOUS
  Administered 2024-12-07 (×2): 100 ug via INTRAVENOUS
  Administered 2024-12-07 – 2024-12-08 (×3): 50 ug via INTRAVENOUS
  Administered 2024-12-08: 04:00:00 100 ug via INTRAVENOUS
  Administered 2024-12-08 – 2024-12-11 (×9): 50 ug via INTRAVENOUS

## 2024-12-04 MED ORDER — PROPOFOL 1000 MG/100ML IV EMUL
0.0000 ug/kg/min | INTRAVENOUS | Status: DC
  Administered 2024-12-04: 50 ug/kg/min via INTRAVENOUS
  Administered 2024-12-04: 45 ug/kg/min via INTRAVENOUS
  Administered 2024-12-04: 55 ug/kg/min via INTRAVENOUS
  Administered 2024-12-04 (×2): 35 ug/kg/min via INTRAVENOUS
  Administered 2024-12-05: 40 ug/kg/min via INTRAVENOUS
  Administered 2024-12-05: 50 ug/kg/min via INTRAVENOUS
  Administered 2024-12-05: 35 ug/kg/min via INTRAVENOUS
  Administered 2024-12-05: 25 ug/kg/min via INTRAVENOUS
  Administered 2024-12-05: 50 ug/kg/min via INTRAVENOUS
  Administered 2024-12-06 (×4): 40 ug/kg/min via INTRAVENOUS
  Administered 2024-12-06: 35 ug/kg/min via INTRAVENOUS
  Administered 2024-12-07: 06:00:00 30 ug/kg/min via INTRAVENOUS
  Administered 2024-12-07 (×2): 35 ug/kg/min via INTRAVENOUS
  Administered 2024-12-07: 02:00:00 40 ug/kg/min via INTRAVENOUS
  Administered 2024-12-07 – 2024-12-08 (×2): 35 ug/kg/min via INTRAVENOUS
  Administered 2024-12-08: 22:00:00 45 ug/kg/min via INTRAVENOUS
  Administered 2024-12-08: 04:00:00 35 ug/kg/min via INTRAVENOUS
  Administered 2024-12-08 – 2024-12-09 (×3): 45 ug/kg/min via INTRAVENOUS
  Filled 2024-12-04: qty 100
  Filled 2024-12-04: qty 200
  Filled 2024-12-04 (×4): qty 100
  Filled 2024-12-04: qty 200
  Filled 2024-12-04 (×16): qty 100

## 2024-12-04 MED ORDER — ALBUMIN HUMAN 5 % IV SOLN
12.5000 g | Freq: Once | INTRAVENOUS | Status: AC
  Administered 2024-12-04: 12.5 g via INTRAVENOUS
  Filled 2024-12-04: qty 250

## 2024-12-04 MED ORDER — ALBUMIN HUMAN 5 % IV SOLN: 12.5000 g | INTRAVENOUS | Status: AC

## 2024-12-04 MED ORDER — PROPOFOL 500 MG/50ML IV EMUL
INTRAVENOUS | Status: DC | PRN
  Administered 2024-12-04: 50 ug/kg/min via INTRAVENOUS

## 2024-12-04 MED ORDER — ALBUMIN HUMAN 25 % IV SOLN: 12.5000 g | Freq: Once | INTRAVENOUS | Status: DC

## 2024-12-04 MED ORDER — MIDAZOLAM HCL (PF) 2 MG/2ML IJ SOLN
INTRAMUSCULAR | Status: AC | PRN
  Administered 2024-12-03: 1 mg via INTRAVENOUS
  Administered 2024-12-04 (×2): .5 mg via INTRAVENOUS

## 2024-12-04 MED ORDER — INSULIN ASPART 100 UNIT/ML IJ SOLN
0.0000 [IU] | INTRAMUSCULAR | Status: DC
  Administered 2024-12-04 – 2024-12-07 (×13): 2 [IU] via SUBCUTANEOUS
  Filled 2024-12-04 (×13): qty 2

## 2024-12-04 MED ORDER — ROCURONIUM BROMIDE 10 MG/ML (PF) SYRINGE
PREFILLED_SYRINGE | INTRAVENOUS | Status: DC | PRN
  Administered 2024-12-04: 50 mg via INTRAVENOUS
  Administered 2024-12-04: 30 mg via INTRAVENOUS

## 2024-12-04 MED ORDER — ALBUMIN HUMAN 5 % IV SOLN: INTRAVENOUS | Status: DC | PRN

## 2024-12-04 MED ORDER — SODIUM CHLORIDE 0.9 % IV SOLN: INTRAVENOUS | Status: DC | PRN

## 2024-12-04 MED ORDER — THIAMINE HCL 100 MG/ML IJ SOLN
100.0000 mg | Freq: Every day | INTRAMUSCULAR | Status: DC
  Administered 2024-12-04 – 2024-12-11 (×8): 100 mg via INTRAVENOUS
  Filled 2024-12-04 (×8): qty 2

## 2024-12-04 MED ORDER — FENTANYL CITRATE (PF) 100 MCG/2ML IJ SOLN
INTRAMUSCULAR | Status: AC | PRN
  Administered 2024-12-03: 50 ug via INTRAVENOUS
  Administered 2024-12-04 (×2): 25 ug via INTRAVENOUS

## 2024-12-04 MED ORDER — CALCIUM GLUCONATE-NACL 1-0.675 GM/50ML-% IV SOLN
1.0000 g | Freq: Once | INTRAVENOUS | Status: AC
  Administered 2024-12-04: 1000 mg via INTRAVENOUS
  Filled 2024-12-04: qty 50

## 2024-12-04 MED ORDER — ALBUMIN HUMAN 5 % IV SOLN
12.5000 g | INTRAVENOUS | Status: AC
  Administered 2024-12-04: 12.5 g via INTRAVENOUS

## 2024-12-04 MED ORDER — SODIUM CHLORIDE 0.9% IV SOLUTION: Freq: Once | INTRAVENOUS | Status: DC

## 2024-12-04 MED ORDER — ETOMIDATE 2 MG/ML IV SOLN
INTRAVENOUS | Status: DC | PRN
  Administered 2024-12-04: 10 mg via INTRAVENOUS

## 2024-12-04 MED ORDER — ORAL CARE MOUTH RINSE: 15.0000 mL | OROMUCOSAL | Status: DC | PRN

## 2024-12-04 MED ORDER — FENTANYL CITRATE (PF) 50 MCG/ML IJ SOSY: 25.0000 ug | PREFILLED_SYRINGE | Freq: Once | INTRAMUSCULAR | Status: DC

## 2024-12-04 MED ADMIN — Epinephrine PF Inj 1 MG/ML: 7 ug/min | INTRAVENOUS | NDC 99999070065

## 2024-12-04 MED ADMIN — Epinephrine PF Inj 1 MG/ML: 8 ug/min | INTRAVENOUS | NDC 99999070065

## 2024-12-04 MED ADMIN — Sodium Chloride Irrigation Soln 0.9%: 1000 mL | NDC 99999050048

## 2024-12-04 MED ADMIN — Albumin, Human Inj 5%: 12.5 g | INTRAVENOUS | NDC 44206031025

## 2024-12-04 MED FILL — Albumin, Human Inj 5%: 12.5000 g | INTRAVENOUS | Qty: 250 | Status: AC

## 2024-12-04 NOTE — Progress Notes (Signed)
 1 Day Post-Op Procedures (LRB): LAPAROTOMY, EXPLORATORY small bowel resection abthrea therapy (N/A) Subjective:  He had right thoracentesis complicated by intercostal artery injury and large right hemothorax with marked drop in Hgb to 5.4 with hemodynamic instability. He underwent embolization of intercostal by IR. Subsequently developed abdominal pain and distension with CTA showing pneumotosis of bowel and portal venous gas. He underwent exp lap by GS with resection of 75 cm of ileum that was ischemic but not perforated. Gut left in discontinuity with plan for re-exploration in a couple days. Hemodynamics have started to stabilize with blood and fluid resuscitation.  Objective: Vital signs in last 24 hours: Temp:  [91.9 F (33.3 C)-101.8 F (38.8 C)] 99.9 F (37.7 C) (12/12 1515) Pulse Rate:  [53-192] 109 (12/12 1515) Cardiac Rhythm: Atrial fibrillation (12/12 0800) Resp:  [10-34] 18 (12/12 1515) BP: (51-120)/(36-102) 88/59 (12/12 1515) SpO2:  [89 %-100 %] 94 % (12/12 1515) Arterial Line BP: (51-135)/(-28-121) 75/43 (12/12 1515) FiO2 (%):  [2 %-60 %] 60 % (12/12 1109)  Hemodynamic parameters for last 24 hours: PAP: (18-65)/(-2-32) 51/18 CVP:  [1 mmHg-17 mmHg] 11 mmHg CO:  [3.2 L/min-8 L/min] 7.4 L/min CI:  [1.7 L/min/m2-4.3 L/min/m2] 4 L/min/m2  Intake/Output from previous day: 12/11 0701 - 12/12 0700 In: 8340.7 [P.O.:360; I.V.:2163.7; Aonni:5815; IV Piggyback:1309.5] Out: 7765 [Urine:2180; Emesis/NG output:100; Drains:425; Blood:50; Chest Tube:5010] Intake/Output this shift: Total I/O In: 3240.7 [I.V.:1287; Blood:451.7; Other:96.6; IV Piggyback:1405.5] Out: 815 [Urine:165; Drains:170; Chest Tube:480]   Neurologic: sedated on vent Heart: regular rate and rhythm Lungs: clear to auscultation bilaterally Abdomen: open abdomen with wound vac. Extremities: edema moderate Wound: chest incision healing well  Lab Results: Recent Labs    12/03/24 2214 12/04/24 0157  12/04/24 0435 12/04/24 0437 12/04/24 0631  WBC 10.1  --  5.4  --   --   HGB 5.5*   < > 10.0* 9.5* 10.4*  HCT 15.9*   < > 28.6* 28.0* 29.6*  PLT 98*  98*  --  71*  71*  --   --    < > = values in this interval not displayed.   BMET:  Recent Labs    12/03/24 2214 12/04/24 0157 12/04/24 0435 12/04/24 0437  NA 128*   < > 132* 133*  K 3.8   < > 4.2 4.3  CL 97*  --  99  --   CO2 26  --  20*  --   GLUCOSE 158*  --  207*  --   BUN 21*  --  23*  --   CREATININE 1.19  --  1.28*  --   CALCIUM  7.3*  --  7.4*  --    < > = values in this interval not displayed.    PT/INR:  Recent Labs    12/04/24 0435  LABPROT 20.1*  INR 1.6*   ABG    Component Value Date/Time   PHART 7.322 (L) 12/04/2024 0437   HCO3 20.8 12/04/2024 0437   TCO2 22 12/04/2024 0437   ACIDBASEDEF 6.0 (H) 12/04/2024 0437   O2SAT 98 12/04/2024 0437   CBG (last 3)  Recent Labs    12/04/24 0439 12/04/24 0755 12/04/24 1122  GLUCAP 205* 148* 109*   CXR this am showed some remaining right hemothorax with right lung base atelectasis but much improved. Left lung ok. Assessment/Plan:  POD 4 REPLACEMENT, AORTIC VALVE, OPEN WITH INSPIRIS RESILIA AORTIC VALVE 23mm (N/A) REPLACEMENT, MITRAL VALVE WITH MITRIS RESILIA MITRAL VALVE 27mm (N/A) INSERTION, CARDIAC ASSIST DEVICE, IMPELLA (N/A) ECHOCARDIOGRAM,  TRANSESOPHAGEAL, INTRAOPERATIVE (N/A)  POD 1 LAPAROTOMY, EXPLORATORY small bowel resection abthrea therapy (N/A)  Hemodynamics more stable this afternoon after blood, FFP and fluid but on high dose vasopressor with NE 32, vaso 0.04 Milrinone  0.25, Epi 8, Impella P7-8. Continue support and wean NE as tolerated to minimize the risk of high dose vasoconstrictors.  Right chest tube output decreased to 45-50/hr  today and Hgb has been stable. Followup CXR in am. May eventually need right VATS for removal of retained hemothorax.  PICC line for TNA.  GS planning reexploration of abdomen this weekend.  CCM and AHF  teams are following closely to assist in management of this complicated pt.  LOS: 11 days    Alejandro Lopez 12/04/2024

## 2024-12-04 NOTE — Progress Notes (Signed)
 [facility] Progress Note  1 Day Post-Op  Subjective: CC: intubated, ill   Objective: Vital signs in last 24 hours: Temp:  [91.9 F (33.3 C)-101.5 F (38.6 C)] 101.5 F (38.6 C) (12/12 0927) Pulse Rate:  [53-192] 116 (12/12 0720) Resp:  [0-34] 21 (12/12 0927) BP: (74-126)/(59-102) 107/71 (12/12 0035) SpO2:  [89 %-100 %] 99 % (12/12 0515) Arterial Line BP: (51-148)/(-28-123) 64/60 (12/12 0927) FiO2 (%):  [2 %-60 %] 60 % (12/12 0720) Last BM Date : 12/04/24  Intake/Output from previous day: 12/11 0701 - 12/12 0700 In: 8340.7 [P.O.:360; I.V.:2163.7; Aonni:5815; IV Piggyback:1309.5] Out: 7765 [Urine:2180; Emesis/NG output:100; Drains:425; Blood:50; Chest Tube:5010] Intake/Output this shift: Total I/O In: 255.9 [I.V.:228.3; IV Piggyback:27.6] Out: 175 [Urine:50; Chest Tube:125]  PE: Gen:  intubated, sedated, ill appearing male Card:  tachycardic Pulm:  ventilated respirations, R chest tube in place high volume sanguinous output (5L) Abd: soft but some fullness, VAC in place holding suction, sanguinous output (cannister 1/3 full)  Skin: warm and dry, edematous Psych:unable to assess  Lab Results:  Recent Labs    12/03/24 2214 12/04/24 0157 12/04/24 0435 12/04/24 0437 12/04/24 0631  WBC 10.1  --  5.4  --   --   HGB 5.5*   < > 10.0* 9.5* 10.4*  HCT 15.9*   < > 28.6* 28.0* 29.6*  PLT 98*  98*  --  71*  71*  --   --    < > = values in this interval not displayed.   BMET Recent Labs    12/03/24 2214 12/04/24 0157 12/04/24 0435 12/04/24 0437  NA 128*   < > 132* 133*  K 3.8   < > 4.2 4.3  CL 97*  --  99  --   CO2 26  --  20*  --   GLUCOSE 158*  --  207*  --   BUN 21*  --  23*  --   CREATININE 1.19  --  1.28*  --   CALCIUM  7.3*  --  7.4*  --    < > = values in this interval not displayed.   PT/INR Recent Labs    12/03/24 2214 12/04/24 0435  LABPROT 20.2* 20.1*  INR 1.6* 1.6*   CMP     Component Value Date/Time   NA 133 (L) 12/04/2024  0437   K 4.3 12/04/2024 0437   CL 99 12/04/2024 0435   CO2 20 (L) 12/04/2024 0435   GLUCOSE 207 (H) 12/04/2024 0435   BUN 23 (H) 12/04/2024 0435   CREATININE 1.28 (H) 12/04/2024 0435   CALCIUM  7.4 (L) 12/04/2024 0435   PROT 4.9 (L) 12/02/2024 0522   ALBUMIN  2.4 (L) 12/02/2024 0522   AST 68 (H) 12/02/2024 0522   ALT 28 12/02/2024 0522   ALKPHOS 49 12/02/2024 0522   BILITOT 1.1 12/02/2024 0522   GFRNONAA >60 12/04/2024 0435   GFRAA >60 09/18/2018 1226   Lipase     Component Value Date/Time   LIPASE 25 01/22/2021 1006       Studies/Results: US  EKG SITE RITE Result Date: 12/04/2024 If Site Rite image not attached, placement could not be confirmed due to current cardiac rhythm.  DG CHEST PORT 1 VIEW Result Date: 12/04/2024 CLINICAL DATA:  Ventilator dependence. EXAM: PORTABLE CHEST 1 VIEW COMPARISON:  12/03/2024 FINDINGS: Endotracheal tube tip is approximately 3.8 cm above the base of the carina. The NG tube passes into the stomach although the distal tip position is not included on the film. Right-sided  Impella device is stable in position. A left IJ pulmonary artery catheter tip overlies expected location of the interlobar pulmonary artery. Right pleural drain again noted with persistent right pleural fluid. Basilar collapse/consolidation noted, IMPRESSION: 1. No substantial interval change. 2. Persistent right pleural effusion with right base collapse/consolidation. 3. Support apparatus as above. Electronically Signed   By: Camellia Candle M.D.   On: 12/04/2024 07:15   DG Abd 1 View Result Date: 12/04/2024 CLINICAL DATA:   Routine adult health maintenance Best images obtainable due to patient's condition EXAM: ABDOMEN - 1 VIEW COMPARISON:  12/04/2024 FINDINGS: NG tube tip is in the distal stomach. Diffuse gaseous distention of colon evident, similar to prior. IMPRESSION: NG tube tip is in the distal stomach. Electronically Signed   By: Camellia Candle M.D.   On: 12/04/2024 07:11    CT Angio Chest/Abd/Pel for Dissection W and/or W/WO Addendum Date: 12/03/2024 **ADDENDUM #1 * ADDENDUM: ---------------------------------------------------- These results were called to Dr. Daniel  at 10:15 pm on 12/03/2024. Electronically signed by: Franky Crease MD 12/03/2024 10:23 PM EST RP Workstation: [id number]   Result Date: 12/03/2024 *ORIGINAL REPORT **EXAM: CT CHEST, ABDOMEN AND PELVIS WITH AND WITHOUT CONTRAST 12/03/2024 09:52:07 PM TECHNIQUE: CT of the chest, abdomen and pelvis was performed with and without the administration of intravenous contrast. Multiplanar reformatted images are provided for review. Automated exposure control, iterative reconstruction, and/or weight based adjustment of the mA/kV was utilized to reduce the radiation dose to as low as reasonably achievable. COMPARISON: None available. CLINICAL HISTORY: Acute aortic syndrome (AAS) suspected; Evaluate for intercostal bleeding or other sources after thoracentesis; also evaluate bowel. FINDINGS: CHEST: MEDIASTINUM AND LYMPH NODES: Mild cardiomegaly. Impella device tip in the left ventricle. Swan-ganz catheter tip in the central right pulmonary artery. No evidence of aortic aneurysm or dissection. The central airways are clear. No mediastinal, hilar or axillary lymphadenopathy. LUNGS AND PLEURA: Right chest tube in place. Moderate to large right pleural effusion with compressive atelectasis in the right middle lobe and right lower lobe. There appears to be active extravasation of contrast posteriorly between the right 9th and 10th ribs and likely into the pleural space, possibly related to prior thoracentesis. The pleural fluid appears complex on the right, likely hemothorax. Small left pleural effusion with compressive atelectasis in the left lower lobe. Tiny right pneumothorax. Biapical subpleural blebs. No evidence of pulmonary embolus. ABDOMEN AND PELVIS: LIVER: Gas is seen branching peripherally in the liver compatible with  portal venous gas. GALLBLADDER AND BILE DUCTS: Gallbladder is unremarkable. No biliary ductal dilatation. SPLEEN: No acute abnormality. PANCREAS: No acute abnormality. ADRENAL GLANDS: No acute abnormality. KIDNEYS, URETERS AND BLADDER: No stones in the kidneys or ureters. No hydronephrosis. No perinephric or periureteral stranding. Urinary bladder is unremarkable. GI AND BOWEL: Stomach and small bowel are decompressed. Diffuse gaseous distention of the colon suggests ileus. Moderate stool burden in the rectosigmoid colon. Pneumatosis noted in small bowel loops in the lower pelvis with air in the mesenteric veins feeding these small bowel loops. Pneumatosis also noted in the right colon wall. REPRODUCTIVE ORGANS: No acute abnormality. PERITONEUM AND RETROPERITONEUM: No ascites. No free air. VASCULATURE: Aorta is normal in caliber. ABDOMINAL AND PELVIS LYMPH NODES: No lymphadenopathy. BONES AND SOFT TISSUES: No acute osseous abnormality. No focal soft tissue abnormality. IMPRESSION: 1. Active contrast extravasation between the right 9th and 10th ribs with likely extension into the pleural space, suspicious for ongoing intercostal bleeding likely related to recent thoracentesis. 2. Moderate to large right pleural effusion compatible with  hemothorax with compressive atelectasis in the right middle and lower lobes; right chest tube present; tiny right pneumothorax. 3. No evidence of aortic aneurysm, aortic dissection, or pulmonary embolus. 4. Pneumatosis involving small bowel loops in the lower pelvis and the right colon with mesenteric venous gas and portal venous gas, concerning for bowel ischemia. Recommend surgical consultation. 5. Small left pleural effusion with compressive atelectasis in the left lower lobe. 6. . Attempts are being made to contact the ordering physician with the results. An addendum will be made at that time. Electronically signed by: Franky Crease MD 12/03/2024 10:09 PM EST RP Workstation:  [id number]   DG Chest Port 1 View Result Date: 12/03/2024 EXAM: 1 VIEW(S) XRAY OF THE CHEST 12/03/2024 07:57:00 PM COMPARISON: Portable chest today at 7:02 pm. CLINICAL HISTORY: Encounter for chest tube placement. FINDINGS: LINES, TUBES AND DEVICES: A pigtail chest tube has been inserted on the right through the 6th intercostal space, with the pigtail in the lateral upper right thorax. Left IJ Swan-Ganz line again terminates in the distal right pulmonary artery. Stable positioning of right subclavian Impella device. LUNGS AND PLEURA: No pneumothorax. Moderate to large right pleural effusion is still present but decreased in the interval. Small left pleural effusion unchanged. The right mid to lower lung is obscured by pleural fluid. There is patchy consolidation in the left lower lung field. Left mid and both apical lungs are clear. HEART AND MEDIASTINUM: Sternotomy sutures and two cardiac valve replacements are again shown. There is mild cardiomegaly and mild central vascular prominence as before. BONES AND SOFT TISSUES: No acute osseous abnormality. IMPRESSION: 1. Right pigtail chest tube in position with no measurable pneumothorax. 2. Moderate to large right pleural effusion, decreased compared to earlier today. 3. Patchy consolidation in the left lower lung field. 4. Small left pleural effusion, unchanged. 5. Cardiomegaly . Electronically signed by: Francis Quam MD 12/03/2024 08:10 PM EST RP Workstation: [id number]   DG CHEST PORT 1 VIEW Result Date: 12/03/2024 EXAM: 1 VIEW(S) XRAY OF THE CHEST 12/03/2024 07:05:00 PM COMPARISON: None available. CLINICAL HISTORY: ABLA (acute blood loss anemia). FINDINGS: LINES, TUBES AND DEVICES: Impella device and Swan-Ganz catheter remain in place, unchanged. LUNGS AND PLEURA: Enlarging right pleural effusion, now large with atelectasis throughout the right lung. Vascular congestion and left basilar atelectasis. Low lung volumes. No pneumothorax. HEART AND MEDIASTINUM:  No acute abnormality of the cardiac and mediastinal silhouettes. BONES AND SOFT TISSUES: No acute osseous abnormality. IMPRESSION: 1. Enlarging right pleural effusion, now large, with associated right lung atelectasis. 2. Vascular congestion with left basilar atelectasis. 3. Low lung volumes. Electronically signed by: Franky Crease MD 12/03/2024 07:10 PM EST RP Workstation: [id number]   DG Chest Port 1 View Result Date: 12/03/2024 CLINICAL DATA:  Pleural effusion, status post thoracentesis EXAM: PORTABLE CHEST 1 VIEW COMPARISON:  12/03/2024 FINDINGS: Single frontal view of the chest demonstrates stable left internal jugular flow directed central venous catheter, tip overlying the right infrahilar region. Stable mitral and aortic valve prostheses. Cardiac silhouette remains enlarged. Lung volumes are diminished, with bibasilar veiling opacities, right greater than left, consistent with consolidation and effusions. No appreciable change since prior study. No evidence of pneumothorax. IMPRESSION: 1. Persistent bibasilar consolidation and effusions, right greater than left. 2. No evidence of pneumothorax. 3. Stable enlarged cardiac silhouette. Electronically Signed   By: Ozell Daring M.D.   On: 12/03/2024 16:10   DG CHEST PORT 1 VIEW Result Date: 12/03/2024 CLINICAL DATA:  Postop from aortic and mitral valve replacements. Chest tube  removal EXAM: PORTABLE CHEST 1 VIEW COMPARISON:  12/02/2024 FINDINGS: Swan-Ganz catheter and Impella device remain in appropriate position. Left chest tube and mediastinal drains have been removed. No pneumothorax visualized. Heart size is stable. Increased size of small to moderate right pleural effusion is noted. Bibasilar atelectasis probable small left pleural effusion show no significant change. IMPRESSION: No evidence of pneumothorax following chest tube removal. Increased size of small to moderate right pleural effusion. Stable bibasilar atelectasis and probable small left  pleural effusion. Electronically Signed   By: Norleen DELENA Kil M.D.   On: 12/03/2024 09:39   ECHOCARDIOGRAM LIMITED Result Date: 12/02/2024    ECHOCARDIOGRAM LIMITED REPORT   Patient Name:   Alejandro Lopez Date of Exam: 12/02/2024 Medical Rec #:  [id number]        Height:       70.0 in Accession #:    [id number]       Weight:       168.4 lb Date of Birth:  06/19/1981       BSA:          1.940 m Patient Age:    43 years         BP:           122/100 mmHg Patient Gender: M                HR:           97 bpm. Exam Location:  Inpatient Procedure: Limited Echo and Limited Color Doppler (Both Spectral and Color Flow            Doppler were utilized during procedure). Indications:    Impella position check  History:        Patient has prior history of Echocardiogram examinations, most                 recent 12/01/2024. CHF; Risk Factors:Hypertension and Current                 Smoker.                 Aortic Valve: 23 mm Edwards RESILIA valve is present in the                 aortic position.  Sonographer:    Thea Norlander RCS Referring Phys: [id number] AMY D CLEGG IMPRESSIONS  1. Impella 4.88 cm from AV to distal end within LV cavity. Left ventricular ejection fraction, by estimation, is <20%. The left ventricle has severely decreased function.  2. The mitral valve has been repaired/replaced. Echo findings are consistent with normal structure and function of the mitral valve prosthesis.  3. The aortic valve has been repaired/replaced. There is a 23 mm Edwards RESILIA valve present in the aortic position. Echo findings are consistent with normal structure and function of the aortic valve prosthesis.  4. There is normal pulmonary artery systolic pressure. The estimated right ventricular systolic pressure is 33.2 mmHg. FINDINGS  Left Ventricle: Impella 4.88 cm from AV to distal end within LV cavity. Left ventricular ejection fraction, by estimation, is <20%. The left ventricle has severely decreased function. Right  Ventricle: There is normal pulmonary artery systolic pressure. The tricuspid regurgitant velocity is 2.51 m/s, and with an assumed right atrial pressure of 8 mmHg, the estimated right ventricular systolic pressure is 33.2 mmHg. Mitral Valve: The mitral valve has been repaired/replaced. There is a 27 mm Edwards MITRIS bioprosthetic valve present in the mitral position. Echo findings are consistent  with normal structure and function of the mitral valve prosthesis. Tricuspid Valve: Tricuspid valve regurgitation is mild. Aortic Valve: The aortic valve has been repaired/replaced. There is a 23 mm Edwards RESILIA valve present in the aortic position. Echo findings are consistent with normal structure and function of the aortic valve prosthesis. Additional Comments: A venous catheter is visualized in the right ventricle.  TRICUSPID VALVE TR Peak grad:   25.2 mmHg TR Vmax:        251.00 cm/s Oneil Parchment MD Electronically signed by Oneil Parchment MD Signature Date/Time: 12/02/2024/1:05:49 PM    Final     Anti-infectives: Anti-infectives (From admission, onward)    Start     Dose/Rate Route Frequency Ordered Stop   12/04/24 1100  vancomycin  (VANCOCIN ) IVPB 1000 mg/200 mL premix  Status:  Discontinued        1,000 mg 200 mL/hr over 60 Minutes Intravenous Every 12 hours 12/03/24 2238 12/03/24 2242   12/04/24 0000  vancomycin  (VANCOREADY) IVPB 1500 mg/300 mL  Status:  Discontinued        1,500 mg 150 mL/hr over 120 Minutes Intravenous  Once 12/03/24 2236 12/03/24 2242   12/03/24 2300  piperacillin-tazobactam (ZOSYN) IVPB 3.375 g        3.375 g 12.5 mL/hr over 240 Minutes Intravenous Every 8 hours 12/03/24 2236     11/30/24 2130  vancomycin  (VANCOCIN ) IVPB 1000 mg/200 mL premix        1,000 mg 200 mL/hr over 60 Minutes Intravenous  Once 11/30/24 1609 11/30/24 2351   11/30/24 1615  ceFAZolin  (ANCEF ) IVPB 2g/100 mL premix        2 g 200 mL/hr over 30 Minutes Intravenous Every 8 hours 11/30/24 1609 12/02/24 0546    11/30/24 0400  vancomycin  (VANCOREADY) IVPB 1250 mg/250 mL        1,250 mg 166.7 mL/hr over 90 Minutes Intravenous To Surgery 11/29/24 1040 11/30/24 0856   11/30/24 0400  ceFAZolin  (ANCEF ) IVPB 2g/100 mL premix        2 g 200 mL/hr over 30 Minutes Intravenous To Surgery 11/29/24 1040 11/30/24 1239   11/30/24 0400  ceFAZolin  (ANCEF ) IVPB 2g/100 mL premix  Status:  Discontinued        2 g 200 mL/hr over 30 Minutes Intravenous To Surgery 11/29/24 1036 11/30/24 1609        Assessment/Plan  S/P AVR, MCR, Impella placement by Dr. Lucas 12/8   S/P right thoracentesis 12/11 with hemorrhage from intercostal -chest tube placed by TCTS.  S/p emergent IR embolizaton  R intercostal artery by Dr. Philip.  S/p 9-10uPRBC, 2uPlt 4uFFP, 2 cryo  Pneumatosis of the pelvic small bowel and R colon with portal venous gas on CT  S/P exploratory laparotomy, small bowel resection, placement abthera VAC 12/12 Dr Sebastian - hgb stable today, getting some FFP  - remains on pressors, appreciate CCM/heart team mgmt - tentative plan to return to the OR Sunday 12/14 for re-exploration, possible restoration of bowel continuity, possible abdominal closure. STRICT NPO. No meds per tube.    LOS: 11 days   I reviewed nursing notes, Consultant heart failure, TCTS notes, hospitalist notes, last 24 h vitals and pain scores, last 48 h intake and output, last 24 h labs and trends, and last 24 h imaging results.  This care required moderate level of medical decision making.   Almarie Pringle, PA-C [facility] Please see Amion for pager number during day hours 7:00am-4:30pm

## 2024-12-04 NOTE — Telephone Encounter (Signed)
 Called to confirm/remind patient of their appointment [facility] on 12/04/24.   Appointment:   [] Confirmed  [] Left mess   [] No answer/No voice mail  [] VM Full/unable to leave message  [] Phone not in service  Patient reminded to bring all medications and/or complete list.  Confirmed patient has transportation. Gave directions, instructed to utilize valet parking.

## 2024-12-04 NOTE — Progress Notes (Signed)
 Called Dena with [facility]-- phone consent for PICC obtained.   Leita SHAUNNA Gaskins, DO 12/04/2024 2:40 PM [facility]  For contact information, see Amion. If no response to pager, please call PCCM consult pager. After hours, 7PM- 7AM, please call Elink.

## 2024-12-04 NOTE — Procedures (Signed)
 Interventional Radiology Procedure:   Indications: Right hemothorax and active bleeding right intercostal artery  Procedure: Right intercostal arteriography and coil embolization of right intercostal artery at level of right 10th rib  Findings: Right intercostal artery at top of T12 cannulated.  Active bleeding from right intercostal artery branch at level of right 10th rib.  The intercostal branch was embolized with multiple 3 mm Ruby LP coils.   Arteriogram through right groin sheath demonstrates very small arteries and sheath is near inferior epigastric artery takeoff.  Therefore, closure device was not used and sheath was kept in place.     Complications: None     EBL: Minimal  Plan:  Patient is going to OR for ex lap.   Will keep right groin sheath in place and anticipate removing later today.    Mclain Freer R. Philip, MD  Pager: [phone]

## 2024-12-04 NOTE — Progress Notes (Signed)
 Referring Physician(s): Critical Care  Supervising Physician: Philip Cornet  Patient Status:  [facility] - In-pt  Chief Complaint:  Eight hemothorax following a right sided thoracentesis. S/p coil embolization of the right tenth intercostal artery.    Subjective:  Unable to assess due to patient condition   Allergies: Patient has no known allergies.  Medications: Prior to Admission medications  Medication Sig Start Date End Date Taking? Authorizing Provider  albuterol  (VENTOLIN  HFA) 108 (90 Base) MCG/ACT inhaler Inhale 2 puffs into the lungs every 4 (four) hours as needed. 10/14/23  Yes Leath-Warren, Etta PARAS, NP  guaiFENesin (MUCINEX) 600 MG 12 hr tablet Take 1,200 mg by mouth 2 (two) times daily as needed for to loosen phlegm or cough.   Yes [provider]  ibuprofen  (ADVIL ) 800 MG tablet Take 1 tablet (800 mg total) by mouth every 8 (eight) hours as needed for moderate pain. 11/07/22  Yes Suzette Pac, MD  azithromycin  (ZITHROMAX ) 250 MG tablet Take 250 mg by mouth as directed. Patient not taking: Reported on 11/23/2024 11/14/24   [provider]     Vital Signs: BP (!) 87/62   Pulse (!) 112   Temp 100.2 F (37.9 C)   Resp 18   Ht 5' 10 (1.778 m)   Wt 167 lb 5.3 oz (75.9 kg)   SpO2 96%   BMI 24.01 kg/m   Physical Exam Constitutional:      Appearance: He is ill-appearing.  Cardiovascular:     Rate and Rhythm: Regular rhythm. Tachycardia present.     Comments: Right groin site  access site is soft with no active bleeding and no appreciable pseudoaneurysm. Dressing is D/I slight oozing noted.   Pedal pulses palpable.    Pulmonary:     Comments: Intubated on the ventilator Skin:    General: Skin is warm.  Neurological:     Comments: Unable to assess     Imaging:   Labs:  CBC: Recent Labs    12/03/24 0506 12/03/24 1516 12/03/24 1807 12/03/24 2214 12/04/24 0157 12/04/24 0435 12/04/24 0437 12/04/24 0631  WBC 14.6* 13.8*  --   10.1  --  5.4  --   --   HGB 7.5* 8.0*   < > 5.5* 5.1* 10.0* 9.5* 10.4*  HCT 21.5* 22.7*   < > 15.9* 15.0* 28.6* 28.0* 29.6*  PLT 82* 95*  --  98*  98*  --  71*  71*  --   --    < > = values in this interval not displayed.    COAGS: Recent Labs    11/30/24 1626 11/30/24 1950 12/03/24 2214 12/04/24 0435  INR 1.6* 1.3* 1.6* 1.6*  APTT 39*  --  46* 38*    BMP: Recent Labs    12/03/24 0506 12/03/24 1807 12/03/24 1855 12/03/24 2214 12/04/24 0157 12/04/24 0435 12/04/24 0437  NA 129* 131*   < > 128* 134* 132* 133*  K 4.0 4.1   < > 3.8 4.1 4.2 4.3  CL 96* 97*  --  97*  --  99  --   CO2 27 25  --  26  --  20*  --   GLUCOSE 92 145*  --  158*  --  207*  --   BUN 16 19  --  21*  --  23*  --   CALCIUM  8.1* 8.3*  --  7.3*  --  7.4*  --   CREATININE 0.69 1.04  --  1.19  --  1.28*  --   GFRNONAA >60 >60  --  >60  --  >60  --    < > = values in this interval not displayed.    LIVER FUNCTION TESTS: Recent Labs    11/24/24 0510 11/25/24 0305 11/29/24 1629 12/02/24 0522  BILITOT 1.0 0.8 0.9 1.1  AST 47* 41 63* 68*  ALT 74* 64* 66* 28  ALKPHOS 145* 141* 127* 49  PROT 6.1* 5.9* 6.7 4.9*  ALBUMIN  3.4* 3.3* 3.1* 2.4*    Assessment and Plan:  43 y.o. male inpatient. History of HTN, alcohol uses. Presented to the ED at on 11.30.25 with 1 month of SHOB, and bilateral lower extremity swelling. Found to have aortic insufficiency. S/P cardiac catheterization and bioprosthetic vale replacement. Hospital stay complicated by cardiogenic shock  requiring Impella. Now in MOD, developed a right hemothorax following a right sided thoracentesis. S/p coil embolization of the right tenth intercostal artery.   Patient seen at bedside. Remains intubated. Right groin sheath present. Access site is soft with no active bleeding and no appreciable pseudoaneurysm. Dressing dry and intact. Minimal oozing noted to be in the dressing. Leg warm. Pedal pulses palpable.    Patient to stable from IR  perspective s/p right tenth intercostal artery coil embolization. RIJ sheath remains in place. Team can remove sheath at their discretion.   Team please call IR with questions or concerns.   Electronically Signed: Delon JAYSON Beagle, NP 12/04/2024, 2:51 PM   I spent a total of 15 Minutes at the patient's bedside AND on the patient's hospital floor or unit, greater than 50% of which was counseling/coordinating care for s/p coil embolization of right tenth artery intercostal artery

## 2024-12-04 NOTE — Progress Notes (Signed)
 Attempted to contact Dena Louk, patient spouse to obtain telephone consent for PICC placement. Unable to be reached at this time.

## 2024-12-04 NOTE — Anesthesia Procedure Notes (Signed)
 Procedure Name: Intubation Date/Time: 12/04/2024 1:26 AM  Performed by: Tifany Hirsch C., CRNAPre-anesthesia Checklist: Patient identified, Emergency Drugs available, Suction available, Patient being monitored and Timeout performed Patient Re-evaluated:Patient Re-evaluated prior to induction Oxygen  Delivery Method: Circle system utilized Preoxygenation: Pre-oxygenation with 100% oxygen  Induction Type: IV induction and Rapid sequence Laryngoscope Size: Glidescope and 4 Grade View: Grade I Tube type: Oral Tube size: 7.5 mm Number of attempts: 1 Airway Equipment and Method: Rigid stylet and Video-laryngoscopy Placement Confirmation: ETT inserted through vocal cords under direct vision, positive ETCO2 and breath sounds checked- equal and bilateral Secured at: 23 cm Tube secured with: Tape Dental Injury: Teeth and Oropharynx as per pre-operative assessment

## 2024-12-04 NOTE — Anesthesia Postprocedure Evaluation (Signed)
 Anesthesia Post Note  Patient: Alejandro Lopez  Procedure(s) Performed: LAPAROTOMY, EXPLORATORY small bowel resection abthrea therapy (Abdomen)     Patient location during evaluation: ICU Anesthesia Type: General Level of consciousness: sedated Pain management: pain level controlled Vital Signs Assessment: post-procedure vital signs reviewed and stable Respiratory status: patient remains intubated per anesthesia plan Cardiovascular status: stable Postop Assessment: no apparent nausea or vomiting Anesthetic complications: no   No notable events documented.  Last Vitals:  Vitals:   12/04/24 0700 12/04/24 0720  BP:    Pulse:  (!) 116  Resp: 20 20  Temp: 36.8 C   SpO2:      Last Pain:  Vitals:   12/04/24 0400  TempSrc: Core  PainSc:                  Bernardino SQUIBB Barnabas Henriques

## 2024-12-04 NOTE — Progress Notes (Addendum)
 PHARMACY - TOTAL PARENTERAL NUTRITION CONSULT NOTE   Indication: massive bowel resection   Patient Measurements: Height: 5' 10 (177.8 cm) Weight: 75.9 kg (167 lb 5.3 oz) IBW/kg (Calculated) : 73 TPN AdjBW (KG): 69.9 Body mass index is 24.01 kg/m. Usual Weight: weight 79kg on admission, unclear baseline.   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 Patient underwent an aortic and mitral valve replacement on 12/8 with insertion of Impella. Post-op was found to have continuing HgB drop with noted abdominal pain. Was found to have hemorrhage from intercostal, chest tube and embolization was completed. Workup for dropping HgB included CT angio of chest/ab/ and pelvis showed pneumatosis and concern for bowel ischemia. Patient went to OR on 12/12 for ex-lap found to have ischemia of distal ileum over 75cm without perforation. Patient was left in discontinuity with plans to return to OR on Sunday.   Patient with diet throughout admission however noted nausea and abdominal pain during peri-op time period. Pharmacy consulted to start TPN.   Glucose / Insulin : A1C 6.1%, no hx of T2DM, CBG 109-207, rSSI TID ordered, 0u/24h  Electrolytes: Na: 133, K: 4.3, HCO3: 20, ical: 1.09 (2g calcium  gluconate 12/11), mag: 2.0 (4g mag sulfate 12/11), phos: 7.2  Renal: AKI, Scr: 1.28 (baseline ~ 0.7), BUN: 23  Hepatic: 12/9 LFTs WNL, T-bili: 1.1, albumin : 2.4, no TG  Intake / Output; MIVF: LBM 12/12, net - 11.4L, off lasix  since this AM, previously on lasix  80mg  BID (last dose 12/11 AM)   GI Imaging: 12/11 CT angio chest/ab/pelvis: pneumatosis of small bowel with gas, concerning for bowel ischemia  GI Surgeries / Procedures:  12/11 ex-lap: distal ileum with ischemia (75cm), left in discontinuity with plans to return to OR on Sunday to evaluate for further ischemia    Central access: currently double lumen  internal jugular, placing PICC today  TPN start date: 12/12   Nutritional Goals: Goal TPN rate is 90 mL/hr (provides 130 g of protein and 2100 kcals per day) - not on propofol , 60g/L protein, 30g/L lipids, and 13% dextrose    RD Assessment: 2100-2300 kcals, 125-150 g    Current Nutrition:  NPO and TPN  Plan:  Start TPN at 25mL/hr at 1800 - hold lipids given currently on propofol . Propofol  providing approximately 430kcal. TPN providing 63g of protein and 626 kcal. When combined with propofol  providing approximately 1050 kcal meeting ~50% of nutritional needs.  Electrolytes in TPN: Na 42mEq/L, K 59mEq/L, Ca 51mEq/L, Mg 44mEq/L, and Phos 0mmol/L. Cl:Ac max acetate.  Add standard MVI and trace elements to TPN Initiate Resistant q4h SSI and adjust as needed - change from TID.  Monitor TPN labs on Mon/Thurs, daily until stabilization.  Low threshold to concentrate TPN given EF and recent diuresis. Patient likely currently volume down in the setting of hemorrhagic shock.   Powell Blush, PharmD, BCCCP  12/04/2024,12:33 PM

## 2024-12-04 NOTE — Progress Notes (Signed)
° °  [address], [place] [address]             [phone]   POD # 4 AVR, MVR POD # 1 Ex lap, bowel resection  Intubated, sedated  BP (!) 82/67   Pulse (!) 109   Temp 99.5 F (37.5 C)   Resp 17   Ht 5' 10 (1.778 m)   Wt 75.9 kg   SpO2 93%   BMI 24.01 kg/m  40/28 CI 3.7 SVO2 65  Epi 8 Milrinone  0.25 Norepi 32 Amio 30   Intake/Output Summary (Last 24 hours) at 12/04/2024 1842 Last data filed at 12/04/2024 1800 Gross per 24 hour  Intake 10984.42 ml  Output 7385 ml  Net 3599.42 ml   CT 640 (50-70/hr)  Hct 16- receiving PRBC PLT 58K- receiving PLT transfusion  Critically ill  Continue resuscitation  Elspeth C. Kerrin, MD [facility] [phone]

## 2024-12-04 NOTE — Progress Notes (Signed)
 Initial Nutrition Assessment  DOCUMENTATION CODES:   Not applicable  INTERVENTION:   Agree with TPN initiation at this time  TPN to meet nutritional needs; TPN order per TPN Pharmacist Continue IV thiamine  and IV folic acid  in addition to standard MVI and trace minerals   NUTRITION DIAGNOSIS:   Inadequate oral intake related to acute illness, altered GI function as evidenced by NPO status.  GOAL:   Patient will meet greater than or equal to 90% of their needs  MONITOR:   Vent status, Labs, Weight trends, I & O's  REASON FOR ASSESSMENT:   Consult, Ventilator New TPN/TNA, Assessment of nutrition requirement/status  ASSESSMENT:   43 yo male admitted with acute systolic heart failure with severe MR and AI. Pt to OR on 12/08 for AVR, MVR and Impella 5.5 placement and extubated 12/09. Pt with thoracentesis on 12/11 followed by IR coil embolization for bleeding intercostal artery with R hemothorax, pt also developed ischemic bowel requiring bowel resection. PMH includes HTN, tobacco and Etoh abuse (reports drinking at least 5 drinks per night)  11/30 Admitted, Echo EF 35-40% (grade 3 diastolic dysfunction), severe MR, aortic insufficiency 12/08 OR: AVR, MVR, Impella 5.5 with Dr. Lucas 12/09 Extubated 12/11 Thoracentesis with 1L bloody output, post procedure anemia, +R Hemithorax, IR for coil embolization of intercostal artery secondary to active bleeding, OR for ischemic bowel (patchy ischemia distal ileum ~75 cm) without perforation requiring resection, bowel left in discontinuity, abdomen open with ABThera in place  Pt remains on vent support, Impella 5.5 Febrile, Tmax 101.8 F Requiring significant amount of pressor support Levophed  up to 32 mcg/min Vasopressin  at 0.04 units/min Epinephrine  at 8 mcg/min Received multiple doses of IV albumin  today Received many units of PRBCs in addition to platelets and FFP  Strict NPO with bowel in discontinuity. OG in place with 100 mL  overnight. Received consult for TPN initiation today.  Abdomen open with ABThera VAC in place. 425 mL overnight, 170 mL so far today  Chest tube with significant bloody output, 5L documented yesterday  UOP 2180 mL in 24 hours, only 135 mL of urine so far today. Noted Creatinine trending up, currently 1.28  Noted pt taking po diet during admission; documentation of po intake limited but pt eating 100% of documented meals between 12/03 and 12/06. No recorded po intake between 12/06 and yesterday. Pt ate 50% of breakfast and 30% of lunch yesterday prior to surgery.  No BM post surgery prior to acute events yesterday, noted 1 BM in OR per report.   Noted no new weight today, wt yesterday 75.9 kg, normally would expect post-op weight to be elevated, especially post significant resuscitation but pt also with significant losses as well. No significant edema noted on exam.   +Tobacco use and EtOH abuse. Folic Acid  1 mg and Thiamine  100 mg IV currently ordered.   Admit Wt: around 76-77 kg Current Wt: 75.9 kg (12/11- no new wt today)  Labs: CBGs 109-205 (goal 140-180) Sodium 132 (L) Potassium 4.2 (wdl) Magnesium  2.0 (wdl) Phosphorus 7.2 (H)  Meds: Dulcolax suppository daily Protonix   Thiamine  Folic Acid  SS novolog   NUTRITION - FOCUSED PHYSICAL EXAM:  Flowsheet Row Most Recent Value  Orbital Region Mild depletion  Upper Arm Region No depletion  Thoracic and Lumbar Region Unable to assess  Buccal Region Unable to assess  Temple Region No depletion  Clavicle Bone Region No depletion  Clavicle and Acromion Bone Region No depletion  Scapular Bone Region No depletion  Dorsal Hand Unable  to assess  Patellar Region Unable to assess  Anterior Thigh Region Unable to assess  Posterior Calf Region Unable to assess  Edema (RD Assessment) Mild    Diet Order:   Diet Order             Diet NPO time specified  Diet effective now                   EDUCATION NEEDS:   Not  appropriate for education at this time  Skin:  Skin Assessment: Skin Integrity Issues: Skin Integrity Issues:: Wound VAC, Incisions Wound Vac: open abdomen-ABThera Incisions: chest x 2  Last BM:  12/12 +BM ( in OR), prior to this no BM since surgery on 12/08  Height:   Ht Readings from Last 1 Encounters:  12/04/24 5' 10 (1.778 m)    Weight:   Wt Readings from Last 1 Encounters:  12/03/24 75.9 kg   BMI:  Body mass index is 24.01 kg/m.  Estimated Nutritional Needs:   Kcal:  2100-2300 kcals  Protein:  125-150 g  Fluid:  1.8 L   Betsey Finger MS, RDN, LDN, CNSC Registered Dietitian 3 Clinical Nutrition RD Inpatient Contact Info in Amion

## 2024-12-04 NOTE — Progress Notes (Signed)
 NAME:  Alejandro Lopez, MRN:  [id number], DOB:  Dec 08, 1981, LOS: 11 ADMISSION DATE:  11/22/2024, CONSULTATION DATE:  12/8 REFERRING MD:  Lucas, CHIEF COMPLAINT:  post cardiac surgery critical care services    History of Present Illness:  43 year old male patient with history of hypertension, alcohol and tobacco abuse, presented to the emergency room on 11/30 with approximately 1 month history of progressive dyspnea accompanied by lower extremity swelling extending up to the level of his scrotum and abdomen. Diagnostic evaluation by echocardiogram showed left ventricular ejection fraction 35 to 40% with grade 3 diastolic dysfunction this was further complicated by severe mitral valve regurgitation and aortic insufficiency because of this he was transferred to [facility] for further evaluation. He underwent cardiac catheterization on 12/4: Right heart hemodynamic parameters showed baseline right atrial pressure 5 mmHg, PA pressure 32/17, pulmonary capillary wedge pressure at 14 estimated Fick 4.9 L/min with cardiac index Fick calculated at 2.59 His PAPi was 3 Left heart cath was negative for coronary artery disease Went to OR 12/8 for MVR and AVR w/ impella insertion. PCCM asked to assist w/ post op care   OR course EBL: 1735 Received  Products: cryo 92ml, FFP 401 Also received DDAVP , 2200 crystalloid, 250ml albumin   Cell saver 1125 Pump time 4hrs Clamp time 2hrs 50 min  Events: multiple defibrillations intra-op  Intra-op ECHO EF estimated 40%  Pertinent  Medical History  Tobacco abuse, alcohol abuse, hypertension  Significant Hospital Events: Including procedures, antibiotic start and stop dates in addition to other pertinent events   11/30 admitted/. ECHO 35 to 40% with grade 3 diastolic dysfunction this was further complicated by severe mitral valve regurgitation and aortic insufficiency 12/4 left and right heart cath 12/8 AVR and MVR w/ bioprosthetic valves. Arrived on icu  w/ Impella 5.5 MCS at flow 4 lpm and P 7. Received 2 more PLTs, 2 FFP and 1 cryo in first 6 hrs post op for cont blood loss/oozing. Required NE, epi and milrinone . Good flow on IMPELLA but minimal pulsatility  at P7. Placed on Amio gtt for VT 12/9 received 1 unit PRBC over night for hgb down to 7.5, hgb 8 getting second unit of blood. Still on P7 3.5 lPM. Extubated. 12/11 1L thora, later hemorrhagic shock and hemothorax> IR embolization. Also found to have ischemic bowel and underwent ex-lap with partial resection of ileum.  Interim History / Subjective:  Yesterday decompensated after thora, found to have hemothorax. Chest tube placed by TCTS with ongoing bloody output. Also had escalating abdominal pain. CT demonstrated active bleeding from R intercostal artery and pneumatosis of bowel. He underwent emergent embolization of the intercostal artery and emergency ex-lap with partial resection of the ileum. He was left  Objective    Blood pressure 107/71, pulse (!) 116, temperature 98.2 F (36.8 C), resp. rate 20, height 5' 10 (1.778 m), weight 75.9 kg, SpO2 99%. PAP: (18-65)/(-2-32) 30/12 CVP:  [0 mmHg-17 mmHg] 3 mmHg CO:  [3.7 L/min-7.1 L/min] 3.7 L/min CI:  [2 L/min/m2-3.8 L/min/m2] 2 L/min/m2  Vent Mode: PRVC FiO2 (%):  [2 %-60 %] 60 % Set Rate:  [18 bmp] 18 bmp Vt Set:  [580 mL] 580 mL PEEP:  [5 cmH20] 5 cmH20 Plateau Pressure:  [20 cmH20] 20 cmH20   Intake/Output Summary (Last 24 hours) at 12/04/2024 0820 Last data filed at 12/04/2024 0757 Gross per 24 hour  Intake 8025.3 ml  Output 7785 ml  Net 240.3 ml   Filed Weights   12/01/24 1000  12/02/24 0500 12/03/24 0500  Weight: 77.7 kg 76.4 kg 75.9 kg    Examination: General middle aged man sitting up in the chair in NAD HENT  [place]/AT, eyes anicteric Pulm breathing comfortably on [place], improved basilar breath sounds.  Cardiac: Impella P7, RRR Abd soft, NT Ext no LE edema, no cyanosis. R arm well perfused.  Neuro Awake, alert,  moving all extremities. Answering questions appropriately.    Coox 72%, repeat 66% Na+ 129 BUN 16 Cr 0.69 WBC 14.6 H/H 7.5/21.5 Platelets 82 CXR personally reviewed> enlarging R effusion,  LIJ swan, impella R axillary.   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 VT; frequent ectopy -amioarone -monitor electrolytes; recheck BMP this afternoon -con't impella, remains on P-7, bicarb purge -inotropes per AHF; con't milrinone  0.59mcg, stopping epi. -GDMT limited with shock. Con't digoxin . Hopefully can start spiro soon. -lasix  increased to 80mg  BID -needs warfarin for ~3 months after impella is removed -con't to progress diet & mobility post-op -con't swan per AHF  R pleural effusion> hemothorax post effusion, now with retained blood and chest tube in place.  -con't chest tube to low suction, watch chest tube output -repeat coags, CBC this afternoon   Lactic acidosis, improving -trend  Ischemic bowel with pneumatosis; s/p partial ileum resection. Currently in discontinuity. -con't vent support while waiting to go back to OR in coming days -TPN; needs PICC for dedicated access -monitor output from wound vac -zosyn  Post-op vent management -LTVV -VAP prevention protocol -PAD protocol for sedation- propofol , fentanyl  -hold off on SBT until back in continuity  Hyperglycemia; h/o pre-DM. A1c 6.1 -SSI PRN; may need more insulin  with TPN -OP follow up with PCP for prediabetes   H/o tobacco abuse H/o asthma vs COPD -Quitting smoking recommended; have counseled him on this previously -switch back to nebs while on vent -pulmonary hygiene   ETOH abuse -he is motivated to quit, I encouraged this -vitamins   Acute blood loss anemia, thrombocytopenia, coagulopathy -2 FFP  today -transfuse for Hb <7 or hemodynamically   Constipation, > was likely related to brewing bowel issues  Hyponatremia -diuresis -avoid hypotonic fluids  At risk for AKI -recheck BMP today -maintain adequate perfusion  DVT: chemical prophylaxis held Needs foley, lines, chest tube  Plan discussed during multidisciplinary rounds.    Critical care time:      This patient is critically ill with multiple organ system failure which requires frequent high complexity decision making, assessment, support, evaluation, and titration of therapies. This was completed through the application of advanced monitoring technologies and extensive interpretation of multiple databases. During this encounter critical care time was devoted to patient care services described in this note for 45 minutes.  Leita SHAUNNA Gaskins, DO 12/04/2024 4:26 PM [facility]  For contact information, see Amion. If no response to pager, please call PCCM consult pager. After hours, 7PM- 7AM, please call Elink.

## 2024-12-04 NOTE — Progress Notes (Signed)
 Increasing NE requirements today despite 7 albumin , 2 FPP  Chest tube 575cc out today Wound vac 170cc Hb 5.9; getting 1 unit pRBC now. Ordering 2 more + 2g Ca+.   Alejandro SHAUNNA Gaskins, DO 12/04/2024 5:48 PM [facility]  For contact information, see Amion. If no response to pager, please call PCCM consult pager. After hours, 7PM- 7AM, please call Elink.

## 2024-12-04 NOTE — Progress Notes (Addendum)
 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  12/9 VT/VF ? vagal episode. Extubated 12/11: S/p thoracentesis. S/p R intercostal artery coil. S/p exp lap for ischemic bowel.   Flow (Liters/min): 4 Liters/min Performance Level: P8 Recent Labs    12/02/24 0522 12/03/24 0506 12/04/24 0435  LDH 344* 341* 436*   CO-OX 63%  PAP: (18-65)/(-2-32) 30/12 CVP:  [0 mmHg-17 mmHg] 3 mmHg CO:  [3.7 L/min-7.1 L/min] 3.7 L/min CI:  [2 L/min/m2-3.8 L/min/m2] 2 L/min/m2  Milrinone  0.25 mcg, Epi @7 , NE @8  and vaso @ 0.04.   S/p thoracentesis 12/11, 1L dark red output. Patient with new hemothorax>> TCTS placed chest tube -2.5L drainage. Hgb down to 5.4 after thoracentesis.   CXR with dilated bowels. CT scan demonstrated active extravasation from a posterior intercostal artery. Also demonstrated portal venous gas and pneumatosis of the small and large bowel.   IR:  Active bleeding from R intercostal artery branch at level of right 10th rib.  The intercostal branch was embolized with multiple 3 mm Ruby LP coils.  Sheath kept in place.    Surgery: Patchy ischemia of the distal ileum extending over about 75 cm without perforation. Colon was distended with gas but viable. Small bowel resection done.  Plan for repeat exp lap in 48 hours for possible reanastomosis.   S/p 7uPRBC, 2uPlt 1uFFP.   Intubated and sedated.    Objective:   Weight Range: 75.9 kg Body mass index is 24.01 kg/m.   Vital Signs:   Temp:  [91.9 F (33.3 C)-99.7 F (37.6 C)] 98.2 F (36.8 C) (12/12 0700) Pulse Rate:  [53-192] 116 (12/12 0720) Resp:  [0-34] 20 (12/12 0720) BP: (74-126)/(59-103) 107/71 (12/12 0035) SpO2:  [89 %-100 %] 99 % (12/12 0515) Arterial Line BP: (51-148)/(-28-123) 73/70 (12/12 0630) FiO2 (%):  [2 %-60 %] 60 % (12/12 0720) Last BM Date : 12/04/24  Weight change: Filed Weights    12/01/24 1000 12/02/24 0500 12/03/24 0500  Weight: 77.7 kg 76.4 kg 75.9 kg    Intake/Output:   Intake/Output Summary (Last 24 hours) at 12/04/2024 0731 Last data filed at 12/04/2024 0600 Gross per 24 hour  Intake 8197.79 ml  Output 7525 ml  Net 672.79 ml    CVP 3 Physical Exam  General:  intubated and sedated. + ETT/OG Neck: JVD UTA.  Cor: Tachy/regular rate & rhythm. No murmurs. R impella site stable Lungs: clear LL. Diminished RL. + R CT Extremities: no edema  Neuro: sedated  Telemetry   ST 110 (Personally reviewed)    Labs    CBC Recent Labs    12/03/24 2214 12/04/24 0157 12/04/24 0435 12/04/24 0437 12/04/24 0631  WBC 10.1  --  5.4  --   --   HGB 5.5*   < > 10.0* 9.5* 10.4*  HCT 15.9*   < > 28.6* 28.0* 29.6*  MCV 88.3  --  82.7  --   --   PLT 98*  98*  --  71*  71*  --   --    < > = values in this interval not displayed.   Basic Metabolic Panel Recent Labs    87/88/74 0506 12/03/24 1807 12/03/24 2214 12/04/24 0157 12/04/24 0435 12/04/24 0437  NA 129*   < > 128*   < > 132* 133*  K 4.0   < > 3.8   < > 4.2 4.3  CL 96*   < > 97*  --  99  --   CO2 27   < > 26  --  20*  --   GLUCOSE 92   < > 158*  --  207*  --   BUN 16   < > 21*  --  23*  --   CREATININE 0.69   < > 1.19  --  1.28*  --   CALCIUM  8.1*   < > 7.3*  --  7.4*  --   MG 1.7  --   --   --  2.0  --    < > = values in this interval not displayed.   Liver Function Tests Recent Labs    12/02/24 0522  AST 68*  ALT 28  ALKPHOS 49  BILITOT 1.1  PROT 4.9*  ALBUMIN  2.4*   No results for input(s): LIPASE, AMYLASE in the last 72 hours. Cardiac Enzymes No results for input(s): CKTOTAL, CKMB, CKMBINDEX, TROPONINI in the last 72 hours.  BNP: BNP (last 3 results) No results for input(s): BNP in the last 8760 hours.  ProBNP (last 3 results) Recent Labs    11/22/24 1958  PROBNP 3,354.0*     D-Dimer Recent Labs    12/03/24 2214 12/04/24 0435  DDIMER 2.18* 2.32*    Hemoglobin A1C No results for input(s): HGBA1C in the last 72 hours. Fasting Lipid Panel No results for input(s): CHOL, HDL, LDLCALC, TRIG, CHOLHDL, LDLDIRECT in the last 72 hours. Thyroid Function Tests No results for input(s): TSH, T4TOTAL, T3FREE, THYROIDAB in the last 72 hours.  Invalid input(s): FREET3  Other results:   Imaging    DG CHEST PORT 1 VIEW Result Date: 12/04/2024 CLINICAL DATA:  Ventilator dependence. EXAM: PORTABLE CHEST 1 VIEW COMPARISON:  12/03/2024 FINDINGS: Endotracheal tube tip is approximately 3.8 cm above the base of the carina. The NG tube passes into the stomach although the distal tip position is not included on the film. Right-sided Impella device is stable in position. A left IJ pulmonary artery catheter tip overlies expected location of the interlobar pulmonary artery. Right pleural drain again noted with persistent right pleural fluid. Basilar collapse/consolidation noted, IMPRESSION: 1. No substantial interval change. 2. Persistent right pleural effusion with right base collapse/consolidation. 3. Support apparatus as above. Electronically Signed   By: Camellia Candle M.D.   On: 12/04/2024 07:15   DG Abd 1 View Result Date: 12/04/2024 CLINICAL DATA:   Routine adult health maintenance Best images obtainable due to patient's condition EXAM: ABDOMEN - 1 VIEW COMPARISON:  12/04/2024 FINDINGS: NG tube tip is in the distal stomach. Diffuse gaseous distention of colon evident, similar to prior. IMPRESSION: NG tube tip is in the distal stomach. Electronically Signed   By: Camellia Candle M.D.   On: 12/04/2024 07:11   CT Angio Chest/Abd/Pel for Dissection W and/or W/WO Addendum Date: 12/03/2024 ADDENDUM #1 ADDENDUM: ---------------------------------------------------- These results were called to Dr. Daniel  at 10:15 pm on 12/03/2024. Electronically signed by: Franky Crease MD 12/03/2024 10:23 PM EST RP Workstation: [id number]   Result Date:  12/03/2024 ORIGINAL REPORT EXAM: CT CHEST, ABDOMEN AND PELVIS WITH AND WITHOUT CONTRAST 12/03/2024 09:52:07 PM TECHNIQUE: CT of the chest, abdomen and pelvis was performed with and without the administration of intravenous contrast. Multiplanar reformatted images are provided for review. Automated exposure control, iterative reconstruction, and/or weight based adjustment of the mA/kV was utilized to reduce the radiation dose to as low as reasonably achievable. COMPARISON: None available. CLINICAL HISTORY: Acute  aortic syndrome (AAS) suspected; Evaluate for intercostal bleeding or other sources after thoracentesis; also evaluate bowel. FINDINGS: CHEST: MEDIASTINUM AND LYMPH NODES: Mild cardiomegaly. Impella device tip in the left ventricle. Swan-ganz catheter tip in the central right pulmonary artery. No evidence of aortic aneurysm or dissection. The central airways are clear. No mediastinal, hilar or axillary lymphadenopathy. LUNGS AND PLEURA: Right chest tube in place. Moderate to large right pleural effusion with compressive atelectasis in the right middle lobe and right lower lobe. There appears to be active extravasation of contrast posteriorly between the right 9th and 10th ribs and likely into the pleural space, possibly related to prior thoracentesis. The pleural fluid appears complex on the right, likely hemothorax. Small left pleural effusion with compressive atelectasis in the left lower lobe. Tiny right pneumothorax. Biapical subpleural blebs. No evidence of pulmonary embolus. ABDOMEN AND PELVIS: LIVER: Gas is seen branching peripherally in the liver compatible with portal venous gas. GALLBLADDER AND BILE DUCTS: Gallbladder is unremarkable. No biliary ductal dilatation. SPLEEN: No acute abnormality. PANCREAS: No acute abnormality. ADRENAL GLANDS: No acute abnormality. KIDNEYS, URETERS AND BLADDER: No stones in the kidneys or ureters. No hydronephrosis. No perinephric or periureteral stranding. Urinary  bladder is unremarkable. GI AND BOWEL: Stomach and small bowel are decompressed. Diffuse gaseous distention of the colon suggests ileus. Moderate stool burden in the rectosigmoid colon. Pneumatosis noted in small bowel loops in the lower pelvis with air in the mesenteric veins feeding these small bowel loops. Pneumatosis also noted in the right colon wall. REPRODUCTIVE ORGANS: No acute abnormality. PERITONEUM AND RETROPERITONEUM: No ascites. No free air. VASCULATURE: Aorta is normal in caliber. ABDOMINAL AND PELVIS LYMPH NODES: No lymphadenopathy. BONES AND SOFT TISSUES: No acute osseous abnormality. No focal soft tissue abnormality. IMPRESSION: 1. Active contrast extravasation between the right 9th and 10th ribs with likely extension into the pleural space, suspicious for ongoing intercostal bleeding likely related to recent thoracentesis. 2. Moderate to large right pleural effusion compatible with hemothorax with compressive atelectasis in the right middle and lower lobes; right chest tube present; tiny right pneumothorax. 3. No evidence of aortic aneurysm, aortic dissection, or pulmonary embolus. 4. Pneumatosis involving small bowel loops in the lower pelvis and the right colon with mesenteric venous gas and portal venous gas, concerning for bowel ischemia. Recommend surgical consultation. 5. Small left pleural effusion with compressive atelectasis in the left lower lobe. 6. . Attempts are being made to contact the ordering physician with the results. An addendum will be made at that time. Electronically signed by: Franky Crease MD 12/03/2024 10:09 PM EST RP Workstation: [id number]   DG Chest Port 1 View Result Date: 12/03/2024 EXAM: 1 VIEW(S) XRAY OF THE CHEST 12/03/2024 07:57:00 PM COMPARISON: Portable chest today at 7:02 pm. CLINICAL HISTORY: Encounter for chest tube placement. FINDINGS: LINES, TUBES AND DEVICES: A pigtail chest tube has been inserted on the right through the 6th intercostal space, with the  pigtail in the lateral upper right thorax. Left IJ Swan-Ganz line again terminates in the distal right pulmonary artery. Stable positioning of right subclavian Impella device. LUNGS AND PLEURA: No pneumothorax. Moderate to large right pleural effusion is still present but decreased in the interval. Small left pleural effusion unchanged. The right mid to lower lung is obscured by pleural fluid. There is patchy consolidation in the left lower lung field. Left mid and both apical lungs are clear. HEART AND MEDIASTINUM: Sternotomy sutures and two cardiac valve replacements are again shown. There is mild cardiomegaly and mild central  vascular prominence as before. BONES AND SOFT TISSUES: No acute osseous abnormality. IMPRESSION: 1. Right pigtail chest tube in position with no measurable pneumothorax. 2. Moderate to large right pleural effusion, decreased compared to earlier today. 3. Patchy consolidation in the left lower lung field. 4. Small left pleural effusion, unchanged. 5. Cardiomegaly . Electronically signed by: Francis Quam MD 12/03/2024 08:10 PM EST RP Workstation: [id number]   DG CHEST PORT 1 VIEW Result Date: 12/03/2024 EXAM: 1 VIEW(S) XRAY OF THE CHEST 12/03/2024 07:05:00 PM COMPARISON: None available. CLINICAL HISTORY: ABLA (acute blood loss anemia). FINDINGS: LINES, TUBES AND DEVICES: Impella device and Swan-Ganz catheter remain in place, unchanged. LUNGS AND PLEURA: Enlarging right pleural effusion, now large with atelectasis throughout the right lung. Vascular congestion and left basilar atelectasis. Low lung volumes. No pneumothorax. HEART AND MEDIASTINUM: No acute abnormality of the cardiac and mediastinal silhouettes. BONES AND SOFT TISSUES: No acute osseous abnormality. IMPRESSION: 1. Enlarging right pleural effusion, now large, with associated right lung atelectasis. 2. Vascular congestion with left basilar atelectasis. 3. Low lung volumes. Electronically signed by: Franky Crease MD 12/03/2024  07:10 PM EST RP Workstation: [id number]   DG Chest Port 1 View Result Date: 12/03/2024 CLINICAL DATA:  Pleural effusion, status post thoracentesis EXAM: PORTABLE CHEST 1 VIEW COMPARISON:  12/03/2024 FINDINGS: Single frontal view of the chest demonstrates stable left internal jugular flow directed central venous catheter, tip overlying the right infrahilar region. Stable mitral and aortic valve prostheses. Cardiac silhouette remains enlarged. Lung volumes are diminished, with bibasilar veiling opacities, right greater than left, consistent with consolidation and effusions. No appreciable change since prior study. No evidence of pneumothorax. IMPRESSION: 1. Persistent bibasilar consolidation and effusions, right greater than left. 2. No evidence of pneumothorax. 3. Stable enlarged cardiac silhouette. Electronically Signed   By: Ozell Daring M.D.   On: 12/03/2024 16:10     Medications:     Scheduled Medications:  sodium chloride    Intravenous Once   sodium chloride    Intravenous Once   sodium chloride    Intravenous Once   sodium chloride    Intravenous Once   sodium chloride    Intravenous Once   sodium chloride    Intravenous Once   sodium chloride    Intravenous Once   acetaminophen   1,000 mg Oral Q6H   Or   acetaminophen  (TYLENOL ) oral liquid 160 mg/5 mL  1,000 mg Per Tube Q6H   bisacodyl   10 mg Oral Daily   Or   bisacodyl   10 mg Rectal Daily   Chlorhexidine  Gluconate Cloth  6 each Topical Daily   digoxin   0.125 mg Oral Daily   docusate sodium   200 mg Oral Daily   feeding supplement  237 mL Oral BID BM   fentaNYL  (SUBLIMAZE ) injection  25-50 mcg Intravenous Once   fluticasone furoate-vilanterol  1 puff Inhalation Daily   folic acid   1 mg Oral Daily   furosemide   80 mg Intravenous BID   insulin  aspart  0-24 Units Subcutaneous TID WC   lidocaine   1 patch Transdermal Q24H   lidocaine -EPINEPHrine   20 mL Intradermal Once   multivitamin with minerals  1 tablet Oral Daily   mupirocin   ointment  1 Application Nasal BID   pantoprazole   40 mg Oral Daily   potassium chloride   20 mEq Oral TID   senna  1 tablet Oral Daily   sodium chloride  flush  3 mL Intravenous Q12H   thiamine   100 mg Oral Daily    Infusions:  sodium chloride  Stopped (12/02/24 1110)  amiodarone  30 mg/hr (12/04/24 0600)   epinephrine  7 mcg/min (12/04/24 0600)   fentaNYL  infusion INTRAVENOUS 50 mcg/hr (12/04/24 0600)   milrinone  0.25 mcg/kg/min (12/04/24 0600)   norepinephrine  (LEVOPHED ) Adult infusion 8 mcg/min (12/04/24 0600)   piperacillin-tazobactam 12.5 mL/hr at 12/04/24 0600   propofol  (DIPRIVAN ) infusion 35 mcg/kg/min (12/04/24 0728)   sodium bicarbonate  25 mEq (Impella PURGE) in dextrose  5 % 1000 mL bag     vasopressin  0.04 Units/min (12/04/24 0600)    PRN Medications: sodium chloride , sodium chloride , albuterol , fentaNYL , fentaNYL  (SUBLIMAZE ) injection, hydrALAZINE , ondansetron  (ZOFRAN ) IV, mouth rinse, oxyCODONE , sodium chloride , sodium chloride  flush, temazepam , traMADol   Patient Profile  43 y.o. male w/ h/o heavy alcohol use (at least 5 drinks per evening) and h/o asthma admitted w/ acute systolic heart failure. Strong family history of cardiac disease: mother and maternal grandmother had heart failure and father with CAD.    HFrEF. Severe valvular diease.  Assessment/Plan  1.  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  Valvular Disease  - severe MR posteriorly directed, mod TR. Likely functional.  - severe AI.-->CMR - Severe AR/MR  - S/P AVR/MVR with Impella 5.5 12/8 - Impella P8 Flow 4. LDH stable. CO-OX  63%  - Currently on Milrinone  0.25 mcg, Epi @7 , NE @8  and vaso @ 0.04. Continue support.  - Will need coumadin when impella out per TCTS  2. Acute Systolic Heart Failure w/ Biventricular Dysfunction  -  Echo EF 34-50%, GIIIDD (restrictive), RV mod reduced, IVC dilated - HS trop not c/w ACS but EKG w/ anteroseptal Qs  - CMRI Severe AR/MR. LVEF 35%  - Cath- normal cors, RA5, PA 32/17  (24), PVR 2.57 Papi 3. CO 4.9 CI 3.8 - L Echo 12/02/24: EF < 20% with severe LVH (new, ?inflammatory), moderately decreased RV function, stable bioprosthetic MV and AoV.  - CVP unhooked. Will reassess soon.  - Impella @ P8 Flow 4.  No heparin  gtt with bleeding, HCO3 purge only.  - Hold digoxin  -  Holding GDMT once off pressors.   3. R hemothorax -12/9 1UPRBC. - Hgb 8.9>7.8 >9.2>7.5 - S/p thoracentesis 12/11, 1L dark red output. Patient with new hemothorax>> TCTS placed chest tube -2.5L drainage. - Overall with 5.5L output overnight, still draining.   4. Transaminates - Hepatitis panel negative. RUQ US  unremarkable  - suspect 2/2 CHF/hepatic congestion + chronic ETOH use    5.  ETOH Abuse - heavy drinker, at least 5 drinks per evening - may be contributing to CM, reduction of intake imperative  - No evidence of withdrawal   6  Hypomagnesemia - follow; goal >2  - replete   7 . DMII  - Hgb A1C 6.1 - On SSI.  8. Ischemic bowel - CXR with dilated bowels.  - CT scan demonstrated active extravasation from a posterior intercostal artery. Also demonstrated portal venous gas and pneumatosis of the small and large bowel.  - Surgery team following - S/p exp/lap this morning. Patchy ischemia of the distal ileum extending over about 75 cm without perforation. Colon was distended with gas but viable.  - Plan for repeat exp lap in 48 hours.   9. R intercostal artery bleed -  Active bleeding from R intercostal artery branch at level of right 10th rib.  The intercostal branch was embolized with multiple 3 mm Ruby LP coils.  - taken to IR for coil embolization - Hgb stable   CRITICAL CARE Performed by: Beckey LITTIE Coe   Total critical care time: 20 minutes  Critical care time was exclusive of separately billable procedures and treating other patients.  Critical care was necessary to treat or prevent imminent or life-threatening deterioration.  Critical care was time spent personally by  me on the following activities: development of treatment plan with patient and/or surrogate as well as nursing, discussions with consultants, evaluation of patient's response to treatment, examination of patient, obtaining history from patient or surrogate, ordering and performing treatments and interventions, ordering and review of laboratory studies, ordering and review of radiographic studies, pulse oximetry and re-evaluation of patient's condition.    Length of Stay: 11  Beckey LITTIE Coe, NP  12/04/2024, 7:31 AM  Advanced Heart Failure Team Pager [phone] (M-F; 7a - 5p)  Please contact [facility] for night-coverage after hours (5p -7a ) and weekends on [email or web address]  Patient seen with NP, I formulated the plan and agree with the above note.    Events from yesterday noted, intercostal bleed with hemothorax and chest tube placement after thoracentesis and ischemic small bowel found with resection.  He has continued to bleed from chest tube and is hypovolemic.   CVP 3 this morning, LVEDP low on Impella console.  Impella 5.5 turned down to P6 with suction.  After 1 bottle albumin , able to turn up to P7 without suction with flow 3.8.  CI 1.8 by Norva.  Creatinine 1.28.  Hgb 10.4 this morning.  Lactate 4.7 => 3.1.   He is on amiodarone  30 mg/hr, epinephrine  8, milrinone  0.25, NE 18, vasopressin  0.04.   General: Intubated/sedated.  Neck: No JVD, no thyromegaly or thyroid nodule.  Lungs: Right chest tube, decreased BS bilaterally.  CV: Nondisplaced PMI.  Heart regular S1/S2, no S3/S4, no murmur.  No peripheral edema.    Abdomen: Wound vac Skin: Intact without lesions or rashes.  Neurologic: Sedated on vent.  Extremities: No clubbing or cyanosis.  HEENT: Normal.   Patient will return to the OR likely tomorrow for repeat exploration and possible re-anastomosis of small bowel.  Today will start TPN.   Ongoing bleeding from chest tube at hemothorax site.  Suspect mixed hypovolemic/cardiogenic shock.   MAP around 63 currently on pressors as noted above with CI 1.8. CVP 3, LVEDP low on Impella limiting Impella support.  Have been able to increase to P7 with improved flow after 1 bottle albumin .   - I think that he needs volume.  Will give another 12.5 g albumin .  Discussed with Dr. Gretta, will give FFP x 2 units.  Hopefully this will allow us  to increase Impella back to P8.   Will check position of Impella by echo => position of Impella looks ok, about 5 cm on echo.  IVC is small, looks hypovolemic.   On empiric Zosyn.   CRITICAL CARE Performed by: Ezra Shuck  Total critical care time: 45 minutes  Critical care time was exclusive of separately billable procedures and treating other patients.  Critical care was necessary to treat or prevent imminent or life-threatening deterioration.  Critical care was time spent personally by me on the following activities: development of treatment plan with patient and/or surrogate as well as nursing, discussions with consultants, evaluation of patient's response to treatment, examination of patient, obtaining history from patient or surrogate, ordering and performing treatments and interventions, ordering and review of laboratory studies, ordering and review of radiographic studies, pulse oximetry and re-evaluation of patient's condition.  Ezra Shuck 12/04/2024 9:28 AM

## 2024-12-04 NOTE — Op Note (Signed)
°  12/04/2024  2:30 AM  PATIENT:  Alejandro Lopez  43 y.o. male  PRE-OPERATIVE DIAGNOSIS:  ischemic bowel  POST-OPERATIVE DIAGNOSIS:  ischemic distal ileum without perforation  PROCEDURE: Exploratory laparotomy Small bowel resection ABThera negative pressure open abdomen VAC   SURGEON:  Surgeon(s): Sebastian Moles, MD  ASSISTANTS: none   ANESTHESIA:   general  EBL:  Total I/O In: 4929.9 [I.V.:1200.4; Blood:2925.2; Other:75.4; IV Piggyback:729] Out: 3775 [Urine:175; Chest Tube:3600]  BLOOD ADMINISTERED: See anesthesia record  DRAINS: none   SPECIMEN:  Excision  DISPOSITION OF SPECIMEN:  PATHOLOGY  COUNTS:  YES  DICTATION: . Findings: Patchy ischemia of the distal ileum extending over about 75 cm without perforation.  Colon was distended with gas but viable.  Procedure in detail: Informed consent was obtained.  After having her procedure in an IR he was brought directly to the operating room for emergent exploratory laparotomy.  He received intravenous antibiotics.  His abdomen was prepped and draped in a sterile fashion.  We did a timeout procedure.  I made a midline incision.  Subcutaneous tissues were dissected down revealing the anterior fascia.  This was divided sharply and the peritoneal cavity was entered under direct vision without difficulty.  The fascia was opened to the length of the incision.  Exploration revealed distended colon filled with gas but viable.  I then began exploring the small bowel.  I located the ligament of Treitz and ran the small bowel distally to the terminal ileum.  The distal ileum had several areas of patchy necrosis without perforation.  There was about 20 cm of terminal ileum left.  Right colon was viable, transverse colon was distended with gas but viable.  Sigmoid colon was also distended with gas but viable.  I reinspected the small bowel.  I divided it distal to the areas of ischemia with a GIA 75 stapler.  I then divided the proximal to  the areas of ischemia again with GIA 75 stapler.  The LigaSure was used to take down the mesentery and the specimen was marked for pathology.  It was about 75 cm in length.  I did not feel will be safe to reanastomose his bowel.  Plan will be for reexploration in the next 48 hours to see if there is any further ischemia and if we can do an anastomosis at that time.  The abdomen was copiously irrigated.  No orogastric tube was inserted by anesthesia and confirmed in position.  The remaining bowel was viable.  The 2 ends of ileum were tacked together with a 2-0 silk for ease of reexploration.  Bowel was replaced in anatomic position and the inner drape of the ABThera was tucked around all of the bowel.  2 blue sponges were fashioned on top of that followed by the VAC drape.  It was hooked up to suction and there was good seal.  All counts were correct.  There were no complications.  He tolerated procedure and was taken directly back to the intensive care unit in critical condition. PATIENT DISPOSITION:  ICU - intubated and critically ill.   Delay start of Pharmacological VTE agent (>24hrs) due to surgical blood loss or risk of bleeding:  no  Moles Sebastian, MD, MPH, FACS Pager: [phone]  12/12/20252:30 AM

## 2024-12-04 NOTE — Transfer of Care (Signed)
 Immediate Anesthesia Transfer of Care Note  Patient: Alejandro Lopez  Procedure(s) Performed: LAPAROTOMY, EXPLORATORY small bowel resection abthrea therapy (Abdomen)  Patient Location: ICU  Anesthesia Type:General  Level of Consciousness: sedated and Patient remains intubated per anesthesia plan  Airway & Oxygen  Therapy: Patient remains intubated per anesthesia plan and Patient placed on Ventilator (see vital sign flow sheet for setting)  Post-op Assessment: Report given to RN and Post -op Vital signs reviewed and stable  Post vital signs: Reviewed and stable  Last Vitals:  Vitals Value Taken Time  BP 103/74   Temp    Pulse 83   Resp    SpO2 97     Last Pain:  Vitals:   12/03/24 2305  TempSrc: Core  PainSc:       Patients Stated Pain Goal: 0 (12/03/24 0400)  Complications: No notable events documented.

## 2024-12-04 NOTE — Progress Notes (Signed)
 Peripherally Inserted Central Catheter Placement  The IV Nurse has discussed with the patient and/or persons authorized to consent for the patient, the purpose of this procedure and the potential benefits and risks involved with this procedure.  The benefits include less needle sticks, lab draws from the catheter, and the patient may be discharged home with the catheter. Risks include, but not limited to, infection, bleeding, blood clot (thrombus formation), and puncture of an artery; nerve damage and irregular heartbeat and possibility to perform a PICC exchange if needed/ordered by physician.  Alternatives to this procedure were also discussed.  Bard Power PICC patient education guide, fact sheet on infection prevention and patient information card has been provided to patient /or left at bedside.   Consent obtained by bedside RN and MD  PICC Placement Documentation  PICC Triple Lumen 12/04/24 Right Brachial 40 cm 0 cm (Active)  Indication for Insertion or Continuance of Line Vasoactive infusions 12/04/24 1600  Exposed Catheter (cm) 0 cm 12/04/24 1600  Site Assessment Clean, Dry, Intact 12/04/24 1600  Lumen #1 Status Flushed;Saline locked;Blood return noted 12/04/24 1600  Lumen #2 Status Flushed;Saline locked;Blood return noted 12/04/24 1600  Lumen #3 Status Blood return noted 12/04/24 1600  Dressing Type Transparent;Securing device 12/04/24 1600  Dressing Status Antimicrobial disc/dressing in place;Clean, Dry, Intact 12/04/24 1600  Line Care Connections checked and tightened 12/04/24 1600  Line Adjustment (NICU/IV Team Only) No 12/04/24 1600  Dressing Intervention New dressing;Adhesive placed at insertion site (IV team only) 12/04/24 1600  Dressing Change Due 12/11/24 12/04/24 1600       Ethyl Adonna Fuller 12/04/2024, 4:33 PM

## 2024-12-05 ENCOUNTER — Inpatient Hospital Stay (HOSPITAL_COMMUNITY)

## 2024-12-05 DIAGNOSIS — I5021 Acute systolic (congestive) heart failure: Secondary | ICD-10-CM | POA: Diagnosis not present

## 2024-12-05 LAB — POCT I-STAT 7, (LYTES, BLD GAS, ICA,H+H)
Acid-base deficit: 2 mmol/L (ref 0.0–2.0)
Bicarbonate: 23.7 mmol/L (ref 20.0–28.0)
Calcium, Ion: 1.06 mmol/L — ABNORMAL LOW (ref 1.15–1.40)
HCT: 31 % — ABNORMAL LOW (ref 39.0–52.0)
Hemoglobin: 10.5 g/dL — ABNORMAL LOW (ref 13.0–17.0)
O2 Saturation: 97 %
Patient temperature: 37.5
Potassium: 4.6 mmol/L (ref 3.5–5.1)
Sodium: 130 mmol/L — ABNORMAL LOW (ref 135–145)
TCO2: 25 mmol/L (ref 22–32)
pCO2 arterial: 46.5 mmHg (ref 32–48)
pH, Arterial: 7.319 — ABNORMAL LOW (ref 7.35–7.45)
pO2, Arterial: 99 mmHg (ref 83–108)

## 2024-12-05 LAB — BPAM PLATELET PHERESIS
Blood Product Expiration Date: 202512132359
Blood Product Expiration Date: 202512142359
Blood Product Expiration Date: 202512142359
ISSUE DATE / TIME: 202512120049
ISSUE DATE / TIME: 202512121815
ISSUE DATE / TIME: 202512121815
Unit Type and Rh: 6200
Unit Type and Rh: 5100
Unit Type and Rh: 6200

## 2024-12-05 LAB — PREPARE PLATELET PHERESIS
Unit division: 0
Unit division: 0
Unit division: 0

## 2024-12-05 LAB — GLUCOSE, CAPILLARY
Glucose-Capillary: 106 mg/dL — ABNORMAL HIGH (ref 70–99)
Glucose-Capillary: 108 mg/dL — ABNORMAL HIGH (ref 70–99)
Glucose-Capillary: 113 mg/dL — ABNORMAL HIGH (ref 70–99)
Glucose-Capillary: 127 mg/dL — ABNORMAL HIGH (ref 70–99)
Glucose-Capillary: 136 mg/dL — ABNORMAL HIGH (ref 70–99)
Glucose-Capillary: 141 mg/dL — ABNORMAL HIGH (ref 70–99)
Glucose-Capillary: 142 mg/dL — ABNORMAL HIGH (ref 70–99)

## 2024-12-05 LAB — BPAM FFP
Blood Product Expiration Date: 202512162359
Blood Product Expiration Date: 202512162359
Blood Product Expiration Date: 202512162359
Blood Product Expiration Date: 202512162359
ISSUE DATE / TIME: 202512112256
ISSUE DATE / TIME: 202512112256
ISSUE DATE / TIME: 202512120927
ISSUE DATE / TIME: 202512120927
Unit Type and Rh: 600
Unit Type and Rh: 6200
Unit Type and Rh: 6200
Unit Type and Rh: 6200

## 2024-12-05 LAB — TRIGLYCERIDES: Triglycerides: 99 mg/dL (ref <150)

## 2024-12-05 LAB — LACTIC ACID, PLASMA: Lactic Acid, Venous: 1.8 mmol/L (ref 0.5–1.9)

## 2024-12-05 LAB — CBC
HCT: 28.4 % — ABNORMAL LOW (ref 39.0–52.0)
HCT: 27.9 % — ABNORMAL LOW (ref 39.0–52.0)
Hemoglobin: 10.2 g/dL — ABNORMAL LOW (ref 13.0–17.0)
Hemoglobin: 10.1 g/dL — ABNORMAL LOW (ref 13.0–17.0)
MCH: 30 pg (ref 26.0–34.0)
MCH: 30.2 pg (ref 26.0–34.0)
MCHC: 35.9 g/dL (ref 30.0–36.0)
MCHC: 36.2 g/dL — ABNORMAL HIGH (ref 30.0–36.0)
MCV: 83.5 fL (ref 80.0–100.0)
MCV: 83.5 fL (ref 80.0–100.0)
Platelets: 102 K/uL — ABNORMAL LOW (ref 150–400)
Platelets: 84 K/uL — ABNORMAL LOW (ref 150–400)
RBC: 3.4 MIL/uL — ABNORMAL LOW (ref 4.22–5.81)
RBC: 3.34 MIL/uL — ABNORMAL LOW (ref 4.22–5.81)
RDW: 17.2 % — ABNORMAL HIGH (ref 11.5–15.5)
RDW: 17.2 % — ABNORMAL HIGH (ref 11.5–15.5)
WBC: 8.3 K/uL (ref 4.0–10.5)
WBC: 11.4 K/uL — ABNORMAL HIGH (ref 4.0–10.5)
nRBC: 5.9 % — ABNORMAL HIGH (ref 0.0–0.2)
nRBC: 3.4 % — ABNORMAL HIGH (ref 0.0–0.2)

## 2024-12-05 LAB — BASIC METABOLIC PANEL WITH GFR
Anion gap: 12 (ref 5–15)
BUN: 29 mg/dL — ABNORMAL HIGH (ref 6–20)
CO2: 22 mmol/L (ref 22–32)
Calcium: 8.5 mg/dL — ABNORMAL LOW (ref 8.9–10.3)
Chloride: 96 mmol/L — ABNORMAL LOW (ref 98–111)
Creatinine, Ser: 1.22 mg/dL (ref 0.61–1.24)
GFR, Estimated: >60 mL/min (ref >60)
Glucose, Bld: 117 mg/dL — ABNORMAL HIGH (ref 70–99)
Potassium: 4.3 mmol/L (ref 3.5–5.1)
Sodium: 130 mmol/L — ABNORMAL LOW (ref 135–145)

## 2024-12-05 LAB — COMPREHENSIVE METABOLIC PANEL WITH GFR
ALT: 49 U/L — ABNORMAL HIGH (ref 0–44)
AST: 88 U/L — ABNORMAL HIGH (ref 15–41)
Albumin: 2.9 g/dL — ABNORMAL LOW (ref 3.5–5.0)
Alkaline Phosphatase: 45 U/L (ref 38–126)
Anion gap: 11 (ref 5–15)
BUN: 28 mg/dL — ABNORMAL HIGH (ref 6–20)
CO2: 24 mmol/L (ref 22–32)
Calcium: 7.6 mg/dL — ABNORMAL LOW (ref 8.9–10.3)
Chloride: 96 mmol/L — ABNORMAL LOW (ref 98–111)
Creatinine, Ser: 1.58 mg/dL — ABNORMAL HIGH (ref 0.61–1.24)
GFR, Estimated: 55 mL/min — ABNORMAL LOW (ref >60)
Glucose, Bld: 140 mg/dL — ABNORMAL HIGH (ref 70–99)
Potassium: 4.8 mmol/L (ref 3.5–5.1)
Sodium: 131 mmol/L — ABNORMAL LOW (ref 135–145)
Total Bilirubin: 2.3 mg/dL — ABNORMAL HIGH (ref 0.0–1.2)
Total Protein: 4.8 g/dL — ABNORMAL LOW (ref 6.5–8.1)

## 2024-12-05 LAB — PREPARE FRESH FROZEN PLASMA
Unit division: 0
Unit division: 0

## 2024-12-05 LAB — LACTATE DEHYDROGENASE: LDH: 241 U/L — ABNORMAL HIGH (ref 105–235)

## 2024-12-05 LAB — MAGNESIUM: Magnesium: 2.1 mg/dL (ref 1.7–2.4)

## 2024-12-05 LAB — PROTIME-INR
INR: 1.2 (ref 0.8–1.2)
Prothrombin Time: 16.2 s — ABNORMAL HIGH (ref 11.4–15.2)

## 2024-12-05 LAB — COOXEMETRY PANEL
Carboxyhemoglobin: 1 % (ref 0.5–1.5)
Methemoglobin: <0.7 % (ref 0.0–1.5)
O2 Saturation: 70.1 %
Total hemoglobin: 10.6 g/dL — ABNORMAL LOW (ref 12.0–16.0)

## 2024-12-05 LAB — PHOSPHORUS: Phosphorus: 6.8 mg/dL — ABNORMAL HIGH (ref 2.5–4.6)

## 2024-12-05 LAB — FIBRINOGEN: Fibrinogen: 399 mg/dL (ref 210–475)

## 2024-12-05 MED ORDER — TRAVASOL 10 % IV SOLN
INTRAVENOUS | Status: AC
  Filled 2024-12-05: qty 1008

## 2024-12-05 MED ORDER — SODIUM CHLORIDE 0.9 % IV SOLN
4.0000 g | Freq: Once | INTRAVENOUS | Status: AC
  Administered 2024-12-05: 4 g via INTRAVENOUS
  Filled 2024-12-05: qty 40

## 2024-12-05 MED ORDER — NOREPINEPHRINE 16 MG/250ML-% IV SOLN
2.0000 ug/min | INTRAVENOUS | Status: DC
  Administered 2024-12-05: 34 ug/min via INTRAVENOUS
  Administered 2024-12-05: 18 ug/min via INTRAVENOUS
  Administered 2024-12-06: 11 ug/min via INTRAVENOUS
  Administered 2024-12-08 – 2024-12-13 (×2): 4 ug/min via INTRAVENOUS
  Filled 2024-12-05 (×7): qty 250

## 2024-12-05 MED ADMIN — Epinephrine PF Inj 1 MG/ML: 8 ug/min | INTRAVENOUS | NDC 99999070065

## 2024-12-05 NOTE — Progress Notes (Signed)
 NAME:  Alejandro Lopez, MRN:  [id number], DOB:  11-Jul-1981, LOS: 12 ADMISSION DATE:  11/22/2024, CONSULTATION DATE:  12/8 REFERRING MD:  Lucas, CHIEF COMPLAINT:  post cardiac surgery critical care services    History of Present Illness:  43 year old male patient with history of hypertension, alcohol and tobacco abuse, presented to the emergency room on 11/30 with approximately 1 month history of progressive dyspnea accompanied by lower extremity swelling extending up to the level of his scrotum and abdomen. Diagnostic evaluation by echocardiogram showed left ventricular ejection fraction 35 to 40% with grade 3 diastolic dysfunction this was further complicated by severe mitral valve regurgitation and aortic insufficiency because of this he was transferred to [facility] for further evaluation. He underwent cardiac catheterization on 12/4: Right heart hemodynamic parameters showed baseline right atrial pressure 5 mmHg, PA pressure 32/17, pulmonary capillary wedge pressure at 14 estimated Fick 4.9 L/min with cardiac index Fick calculated at 2.59 His PAPi was 3 Left heart cath was negative for coronary artery disease Went to OR 12/8 for MVR and AVR w/ impella insertion. PCCM asked to assist w/ post op care   OR course EBL: 1735 Received  Products: cryo 92ml, FFP 401 Also received DDAVP , 2200 crystalloid, 250ml albumin   Cell saver 1125 Pump time 4hrs Clamp time 2hrs 50 min  Events: multiple defibrillations intra-op  Intra-op ECHO EF estimated 40%  Pertinent  Medical History  Tobacco abuse, alcohol abuse, hypertension  Significant Hospital Events: Including procedures, antibiotic start and stop dates in addition to other pertinent events   11/30 admitted/. ECHO 35 to 40% with grade 3 diastolic dysfunction this was further complicated by severe mitral valve regurgitation and aortic insufficiency 12/4 left and right heart cath 12/8 AVR and MVR w/ bioprosthetic valves. Arrived on icu  w/ Impella 5.5 MCS at flow 4 lpm and P 7. Received 2 more PLTs, 2 FFP and 1 cryo in first 6 hrs post op for cont blood loss/oozing. Required NE, epi and milrinone . Good flow on IMPELLA but minimal pulsatility  at P7. Placed on Amio gtt for VT 12/9 received 1 unit PRBC over night for hgb down to 7.5, hgb 8 getting second unit of blood. Still on P7 3.5 lPM. Extubated. 12/11 1L thora, later hemorrhagic shock and hemothorax> IR embolization. Also found to have ischemic bowel and underwent ex-lap with partial resection of ileum.  Interim History / Subjective:  Overnight UOP improved, chest tube output slowed.  Objective    Blood pressure (!) 102/91, pulse 96, temperature 99.5 F (37.5 C), resp. rate 15, height 5' 10 (1.778 m), weight 80.8 kg, SpO2 99%. PAP: (23-57)/(2-40) 53/28 CVP:  [1 mmHg-31 mmHg] 13 mmHg CO:  [3.2 L/min-8 L/min] 4.6 L/min CI:  [1.7 L/min/m2-4.3 L/min/m2] 2.5 L/min/m2  Vent Mode: PRVC FiO2 (%):  [50 %-60 %] 50 % Set Rate:  [18 bmp] 18 bmp Vt Set:  [580 mL] 580 mL PEEP:  [5 cmH20] 5 cmH20 Plateau Pressure:  [20 cmH20-21 cmH20] 20 cmH20   Intake/Output Summary (Last 24 hours) at 12/05/2024 9287 Last data filed at 12/05/2024 0700 Gross per 24 hour  Intake 9385.57 ml  Output 3025 ml  Net 6360.57 ml   Filed Weights   12/02/24 0500 12/03/24 0500 12/05/24 0500  Weight: 76.4 kg 75.9 kg 80.8 kg    Examination: General critically ill appearing man lying in bed in NAD HENT  Dailey/AT, eyes anicteric Pulm breathing comfortably on  Cardiac: Impella P7, RRR Abd soft, NT Ext no LE  edema, no cyanosis. R arm well perfused.  Neuro Awake, alert, moving all extremities. Answering questions appropriately.   Chest tube output: 970cc, reduced overnight I/O +6.4L overnight  7.32/46/99/24 Na+ 131 BUN 28 Cr 1.58 LDH 241 WBC 8.3 H/H 10.2/28.4 Platelets 102 Fibrinogen  399 INR 1.2 CXR personally reviewed>  R chest tube, either RLL consolidation or dependent retained pleural  effusion  Resolved problem list   Assessment and Plan   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  -con't chest tube to suction, watching output -may need VATS to clean out at the time the impella is removed -repeat CBC this afternoon if still having high wound vac and chest tube output   Lactic acidosis, improving -trend daily  Ischemic bowel with pneumatosis; s/p partial ileum resection. Currently in discontinuity. -back to OR tomorrow; stay on vent today -TPN via picc; keep lipids out while still on vent/ propofol  -monitor wound vac output -zosyn   Post-op vent management -LTVV -VAP prevention protocol -PAD protocol for sedation -daily SAT & SBT once appropriate-- hopeful for tomorrow after OR if he's closed  Hyperglycemia; h/o pre-DM. A1c 6.1 -SSI PRN -goal BG <180 -OP follow up with PCP for prediabetes   H/o tobacco abuse H/o asthma vs COPD -Quitting smoking recommended; have counseled him on this previously -pulmonary hygiene -nebs while on vent   ETOH abuse -wants to quit -TPN   Acute blood loss anemia, thrombocytopenia, coagulopathy due to hemorrhagic shock, consumption with impella -transfuse for Hb <7 or hemodynamically significant bleeding -monitor coags  Constipation, > was likely related to brewing  bowel issues -no bowel regimen now  Hyponatremia -reassess for diuresis later today  AKI, improving -strict I/O -renally dose meds, avoid nephrotoxic meds -maintain adequate perfusion  DVT: chemical prophylaxis held Needs foley, lines, chest tube  Plan discussed during multidisciplinary rounds.  OR tomorrow.    Critical care time:      This patient is critically ill with multiple organ system failure which requires frequent high complexity decision making, assessment, support, evaluation, and titration of therapies. This was completed through the application of advanced monitoring technologies and extensive interpretation of multiple databases. During this encounter critical care time was devoted to patient care services described in this note for 39 minutes.  Leita SHAUNNA Gaskins, DO 12/05/2024 9:26 AM [facility]  For contact information, see Amion. If no response to pager, please call PCCM consult pager. After hours, 7PM- 7AM, please call Elink.

## 2024-12-05 NOTE — Progress Notes (Signed)
 2 Days Post-Op Procedures (LRB): LAPAROTOMY, EXPLORATORY small bowel resection abthrea therapy (N/A) Subjective: Intubated, sedated  Objective: Vital signs in last 24 hours: Temp:  [99.3 F (37.4 C)-101.8 F (38.8 C)] 99.3 F (37.4 C) (12/13 0730) Pulse Rate:  [93-125] 97 (12/13 0730) Cardiac Rhythm: Normal sinus rhythm (12/13 0705) Resp:  [5-26] 14 (12/13 0730) BP: (51-108)/(36-93) 108/79 (12/13 0730) SpO2:  [86 %-100 %] 99 % (12/13 0730) Arterial Line BP: (58-112)/(37-76) 99/72 (12/13 0730) FiO2 (%):  [50 %-60 %] 50 % (12/13 0444) Weight:  [80.8 kg] 80.8 kg (12/13 0500)  Hemodynamic parameters for last 24 hours: PAP: (23-57)/(2-40) 47/25 CVP:  [1 mmHg-31 mmHg] 13 mmHg CO:  [3.2 L/min-8 L/min] 4.5 L/min CI:  [1.7 L/min/m2-4.3 L/min/m2] 2.4 L/min/m2  Intake/Output from previous day: 12/12 0701 - 12/13 0700 In: 9385.6 [I.V.:4882.9; Blood:2181.7; IV Piggyback:1997] Out: 3025 [Urine:1235; Emesis/NG output:200; Drains:620; Chest Tube:970] Intake/Output this shift: No intake/output data recorded.  General appearance: ill-appearing Neurologic: sedated Heart: regular rate and rhythm Lungs: rhonchi bilaterally Abdomen: VAC in place  Lab Results: Recent Labs    12/04/24 2217 12/05/24 0212 12/05/24 0621  WBC 7.4 8.3  --   HGB 9.9* 10.2* 10.5*  HCT 28.1* 28.4* 31.0*  PLT 92* 102*  --    BMET:  Recent Labs    12/04/24 1648 12/05/24 0212 12/05/24 0621  NA 131* 131* 130*  K 4.3 4.8 4.6  CL 99 96*  --   CO2 22 24  --   GLUCOSE 113* 140*  --   BUN 27* 28*  --   CREATININE 1.98* 1.58*  --   CALCIUM  7.6* 7.6*  --     PT/INR:  Recent Labs    12/05/24 0212  LABPROT 16.2*  INR 1.2   ABG    Component Value Date/Time   PHART 7.319 (L) 12/05/2024 0621   HCO3 23.7 12/05/2024 0621   TCO2 25 12/05/2024 0621   ACIDBASEDEF 2.0 12/05/2024 0621   O2SAT 97 12/05/2024 0621   CBG (last 3)  Recent Labs    12/05/24 0043 12/05/24 0304 12/05/24 0729  GLUCAP 142* 136*  106*    Assessment/Plan: S/P Procedures (LRB): LAPAROTOMY, EXPLORATORY small bowel resection abthrea therapy (N/A) NEURO- sedated CV- s/p AVR, MVR  In SR  PA 47/25, CI 2.4, SVO2 64  On milrinone  0.25, epi 8, norepi 33, vasopressin  0.04  Impella p7, 3.8 L/min RESP- VDRF- PRVC 18/50%/5  Right hemothorax, s/p embolization of intercostal  Ct drainage down, keep in place  CXR shows right hemothorax- same to slightly increased RENAL- creatinine down from 1.98 to 1.6  UO increased overnight ENDO- CBG mildly elevated Gi- s/p bowel resection  On TNA for malnutrition ID- day 3 Zosyn  Anemia- Hgb 10.5 post transfusion SCD for DVT prophylaxis   LOS: 12 days    Alejandro Lopez Millers 12/05/2024

## 2024-12-05 NOTE — Progress Notes (Signed)
 Wife updated this evening at bedside.  Leita SHAUNNA Gaskins, DO 12/05/2024 7:48 PM [facility]

## 2024-12-05 NOTE — Progress Notes (Signed)
° °  [address] [place] [address]             [phone]   Intubated and sedated  BP 108/87   Pulse 100   Temp 99.7 F (37.6 C)   Resp 13   Ht 5' 10 (1.778 m)   Wt 80.8 kg   SpO2 97%   BMI 25.56 kg/m  51/27 CI 2.8 Milrinone  0.25, epi 8, vasopressin  0.04, norepi down to 16  PRVC 18/ 40%/ 5 PEEP  1700/1535, CT 170 last 12 hours  Creatinine 1.22 Hgb 10 PLT 84K  Continue current treatments  Sanskriti Greenlaw C. Kerrin, MD [facility] [phone]

## 2024-12-05 NOTE — Progress Notes (Addendum)
° ° °  Assessment & Plan: POD#1 - status post ex lap with SBR & VAC placement - Dr. Sebastian 12/04/2024 - discussed with CCM, Cardiology - VAC in place - plan return to OR 12/14 for small bowel anastomosis, abdominal closure if possible (Dr. Tanda) - pre-op orders entered        Krystal Spinner, MD [facility] practice Office: [phone]        Chief Complaint: Ischemic small bowel  Subjective: Patient in ICU, vent, Impella  Objective: Vital signs in last 24 hours: Temp:  [99.3 F (37.4 C)-101.8 F (38.8 C)] 99.3 F (37.4 C) (12/13 0733) Pulse Rate:  [93-125] 97 (12/13 0746) Resp:  [5-23] 19 (12/13 0746) BP: (51-108)/(36-93) 108/79 (12/13 0730) SpO2:  [86 %-100 %] 99 % (12/13 0746) Arterial Line BP: (58-112)/(37-76) 99/73 (12/13 0733) FiO2 (%):  [40 %-60 %] 40 % (12/13 0746) Weight:  [80.8 kg] 80.8 kg (12/13 0500) Last BM Date :  (GI left in discontinuity from ex-lap)  Intake/Output from previous day: 12/12 0701 - 12/13 0700 In: 9385.6 [I.V.:4882.9; Blood:2181.7; IV Piggyback:1997] Out: 3025 [Urine:1235; Emesis/NG output:200; Drains:620; Chest Tube:970] Intake/Output this shift: No intake/output data recorded.  Physical Exam: Abdomen - VAC in place, 350 cc out serosanguinous overnight  Lab Results:  Recent Labs    12/04/24 2217 12/05/24 0212 12/05/24 0621  WBC 7.4 8.3  --   HGB 9.9* 10.2* 10.5*  HCT 28.1* 28.4* 31.0*  PLT 92* 102*  --    BMET Recent Labs    12/04/24 1648 12/05/24 0212 12/05/24 0621  NA 131* 131* 130*  K 4.3 4.8 4.6  CL 99 96*  --   CO2 22 24  --   GLUCOSE 113* 140*  --   BUN 27* 28*  --   CREATININE 1.98* 1.58*  --   CALCIUM  7.6* 7.6*  --    PT/INR Recent Labs    12/04/24 2217 12/05/24 0212  LABPROT 17.4* 16.2*  INR 1.4* 1.2   Comprehensive Metabolic Panel:    Component Value Date/Time   NA 130 (L) 12/05/2024 0621   NA 131 (L) 12/05/2024 0212   K 4.6 12/05/2024 0621   K 4.8 12/05/2024 0212    CL 96 (L) 12/05/2024 0212   CL 99 12/04/2024 1648   CO2 24 12/05/2024 0212   CO2 22 12/04/2024 1648   BUN 28 (H) 12/05/2024 0212   BUN 27 (H) 12/04/2024 1648   CREATININE 1.58 (H) 12/05/2024 0212   CREATININE 1.98 (H) 12/04/2024 1648   GLUCOSE 140 (H) 12/05/2024 0212   GLUCOSE 113 (H) 12/04/2024 1648   CALCIUM  7.6 (L) 12/05/2024 0212   CALCIUM  7.6 (L) 12/04/2024 1648   AST 88 (H) 12/05/2024 0212   AST 68 (H) 12/02/2024 0522   ALT 49 (H) 12/05/2024 0212   ALT 28 12/02/2024 0522   ALKPHOS 45 12/05/2024 0212   ALKPHOS 49 12/02/2024 0522   BILITOT 2.3 (H) 12/05/2024 0212   BILITOT 1.1 12/02/2024 0522   PROT 4.8 (L) 12/05/2024 0212   PROT 4.9 (L) 12/02/2024 0522   ALBUMIN  2.9 (L) 12/05/2024 0212   ALBUMIN  2.4 (L) 12/02/2024 0522       Krystal Spinner 12/05/2024  Patient ID: Alejandro Lopez, male   DOB: Nov 10, 1981, 43 y.o.   MRN: [id number]

## 2024-12-05 NOTE — TOC Progression Note (Signed)
 Transition of Care [facility]) - Progression Note    Patient Details  Name: Alejandro Lopez MRN: [id number] Date of Birth: Oct 02, 1981  Transition of [facility]) CM/SW Contact  Justina Delcia Czar, RN Phone Number: [phone] 12/05/2024, 7:31 AM  Clinical Narrative:     Patient remains intubated on amiodarone  and Milrinone  gtt. Chart reviewed for discharge readiness, patient not medically stable for d/c. Inpatient CM/CSW will continue to monitor pt's advancement through interdisciplinary progression rounds.   If new pt transition needs arise, MD please place a TOC consult.    Expected Discharge Plan: Home/Self Care Barriers to Discharge: Continued Medical Work up               Expected Discharge Plan and Services In-house Referral: Clinical Social Work Discharge Planning Services: CM Consult   Living arrangements for the past 2 months: Single Family Home                                       Social Drivers of Health (SDOH) Interventions SDOH Screenings   Food Insecurity: No Food Insecurity (11/22/2024)  Housing: Low Risk (11/22/2024)  Transportation Needs: No Transportation Needs (11/22/2024)  Utilities: Not At Risk (11/22/2024)  Social Connections: Unknown (05/06/2022)   Received from [facility]  Tobacco Use: High Risk (11/30/2024)    Readmission Risk Interventions     No data to display

## 2024-12-05 NOTE — Progress Notes (Signed)
 PHARMACY - TOTAL PARENTERAL NUTRITION CONSULT NOTE   Indication: massive bowel resection   Patient Measurements: Height: 5' 10 (177.8 cm) Weight: 80.8 kg (178 lb 2.1 oz) IBW/kg (Calculated) : 73 TPN AdjBW (KG): 69.9 Body mass index is 25.56 kg/m. Usual Weight: weight 79kg on admission, unclear baseline.   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 Patient underwent an aortic and mitral valve replacement on 12/8 with insertion of Impella. Post-op was found to have continuing HgB drop with noted abdominal pain. Was found to have hemorrhage from intercostal, chest tube and embolization was completed. Workup for dropping HgB included CT angio of chest/ab/and pelvis showed pneumatosis and concern for bowel ischemia. Patient went to OR on 12/12 for ex-lap found to have ischemia of distal ileum over 75cm without perforation. Patient was left in discontinuity with plans to return to OR.  Patient with diet throughout admission however noted nausea and abdominal pain during peri-op time period. Pharmacy consulted to manage TPN.   Glucose / Insulin : no hx DM, A1c 6.1% - CBGs < 180 Used 4 units SSI in the past 24 hrs Electrolytes: Na/CL low 131/96, CoCa slightly low post 3gm IV (low iCa), Phos elevated (none in TPN), others WNL Renal: AKI - SCr up 1.58 (BL SCr ~ 0.7), BUN 28.  Previously on Lasix  80mg  IV BID, last dose on 12/11. Hepatic: AST/ALT mildly elevated, tbili 2.3, alk phos / TG WNL, albumin  2.9 *elevation not due to TPN Intake / Output; MIVF: UOP 0.6 ml/kg/hr, NG , drain , chest tube down to , LBM 12/12, net -6.5L GI Imaging: 12/11 CT: pneumatosis of small bowel with gas, concerning for bowel ischemia  GI Surgeries / Procedures:  12/11 ex-lap with distal ileum with ischemia (75cm), left in discontinuity with plans to return to OR on Sunday to evaluate for further  ischemia    Central access: PICC placed 12/04/24 TPN start date: 12/04/24  Nutritional Goals:  Goal TPN rate without lipids is 34mL/hr (provides 143g AA, 28g CHO, and 1542 kCal per day.  70g/L AA and 14% CHO) Goal TPN rate with lipids is 90 mL/hr (60g/L AA, 30g/L ILE, and 13% CHO will provide 130g AA and 2121 kcals per day)  RD Estimated Needs Total Energy Estimated Needs: 2100-2300 kcals Total Protein Estimated Needs: 125-150 g Total Fluid Estimated Needs: 1.8 L  Current Nutrition:  TPN Propofol  22.8 ml/hr = 602 kCal per day  Plan:  Increase TPN without lipids to 60 ml/hr at 1800 to provide 101g AA, 202g CHO and 1089 kCal.  kCal with Propofol  will be 1691 per day. TPN and Propofol  will meet ~80% of needs Electrolytes in TPN: increase Na to 145mEq/L, K 66mEq/L, increase Ca to 34mEq/L, Mg 1.5mEq/L, Phos 31mmol/L, change Cl:Ac to 1:2 Add standard MVI and trace elements to TPN Continue CVTS SSI Q4H Ca gluc 4gm IV x 1 Monitor TPN labs on Mon/Thurs - labs in AM Low threshold to concentrate TPN given EF and recent diuresis - monitor volume status F/u with transitioning off Propofol  as able  Caileigh Canche D. Lendell, PharmD, BCPS, BCCCP 12/05/2024, 7:54 AM

## 2024-12-05 NOTE — Progress Notes (Addendum)
 Patient ID: Alejandro Lopez, male   DOB: 11/03/1981, 43 y.o.   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  12/9 VT/VF ? vagal episode. Extubated 12/11: S/p R thoracentesis with resultant hemothorax.  Chest tube placed. S/p R intercostal artery coil (IR). S/p exp lap for ischemic bowel resection, bowel left in discontinuity  Aggressive resuscitation for ongoing thoracic bleeding yesterday (albumin , FFP, PRBCs).  He has not had any further products overnight, hgb 10.3 this morning.  Plts 92 => 102.    Impella 5.5 P7 Flow 3.7 L/min No alarms LDH 241  Lactate 1.8  Swan: CVP 13 PA 49/26 CI 2.4  Milrinone  0.25 mcg, Epi @8 , NE @31  and vaso @ 0.04.  Trending down now on NE.   Intubated and sedated, on Zosyn .   Telemetry looks like probably NSR   Objective:   Weight Range: 80.8 kg Body mass index is 25.56 kg/m.   Vital Signs:   Temp:  [99.3 F (37.4 C)-101.8 F (38.8 C)] 99.3 F (37.4 C) (12/13 0733) Pulse Rate:  [93-125] 97 (12/13 0746) Resp:  [5-23] 19 (12/13 0746) BP: (51-108)/(36-93) 108/79 (12/13 0730) SpO2:  [86 %-100 %] 99 % (12/13 0746) Arterial Line BP: (58-112)/(37-76) 99/73 (12/13 0733) FiO2 (%):  [40 %-60 %] 40 % (12/13 0746) Weight:  [80.8 kg] 80.8 kg (12/13 0500) Last BM Date :  (GI left in discontinuity from ex-lap)  Weight change: Filed Weights   12/02/24 0500 12/03/24 0500 12/05/24 0500  Weight: 76.4 kg 75.9 kg 80.8 kg    Intake/Output:   Intake/Output Summary (Last 24 hours) at 12/05/2024 0806 Last data filed at 12/05/2024 0700 Gross per 24 hour  Intake 9258.46 ml  Output 2960 ml  Net 6298.46 ml    CVP 13 Physical Exam   General: Intubated/sedated Neck: JVP 10 cm, no thyromegaly or thyroid nodule.  Lungs: Clear to auscultation bilaterally with normal respiratory effort. CV:   Heart  regular S1/S2, no S3/S4, no murmur.  Trace ankle edema.  Abdomen: Wound vac in place Skin: Intact without lesions or rashes.  Neurologic: Sedated on vent Extremities: No clubbing or cyanosis.  HEENT: Normal.   Telemetry   Accelerated junctional 90s (Personally reviewed)    Labs    CBC Recent Labs    12/04/24 2217 12/05/24 0212 12/05/24 0621  WBC 7.4 8.3  --   HGB 9.9* 10.2* 10.5*  HCT 28.1* 28.4* 31.0*  MCV 85.2 83.5  --   PLT 92* 102*  --    Basic Metabolic Panel Recent Labs    87/87/74 0435 12/04/24 0437 12/04/24 1648 12/05/24 0212 12/05/24 0621  NA 132*   < > 131* 131* 130*  K 4.2   < > 4.3 4.8 4.6  CL 99  --  99 96*  --   CO2 20*  --  22 24  --   GLUCOSE 207*  --  113* 140*  --   BUN 23*  --  27* 28*  --   CREATININE 1.28*  --  1.98* 1.58*  --   CALCIUM  7.4*  --  7.6* 7.6*  --   MG 2.0  --   --  2.1  --   PHOS 7.2*  --   --  6.8*  --    < > = values in this interval not displayed.  Liver Function Tests Recent Labs    12/05/24 0212  AST 88*  ALT 49*  ALKPHOS 45  BILITOT 2.3*  PROT 4.8*  ALBUMIN  2.9*   No results for input(s): LIPASE, AMYLASE in the last 72 hours. Cardiac Enzymes No results for input(s): CKTOTAL, CKMB, CKMBINDEX, TROPONINI in the last 72 hours.  BNP: BNP (last 3 results) No results for input(s): BNP in the last 8760 hours.  ProBNP (last 3 results) Recent Labs    11/22/24 1958  PROBNP 3,354.0*     D-Dimer Recent Labs    12/03/24 2214 12/04/24 0435  DDIMER 2.18* 2.32*   Hemoglobin A1C No results for input(s): HGBA1C in the last 72 hours. Fasting Lipid Panel Recent Labs    12/05/24 0212  TRIG 99   Thyroid Function Tests No results for input(s): TSH, T4TOTAL, T3FREE, THYROIDAB in the last 72 hours.  Invalid input(s): FREET3  Other results:   Imaging    DG CHEST PORT 1 VIEW Result Date: 12/04/2024 CLINICAL DATA:  Ventilator dependence. EXAM: PORTABLE CHEST 1 VIEW COMPARISON:   12/03/2024 FINDINGS: Endotracheal tube tip is approximately 3.8 cm above the base of the carina. The NG tube passes into the stomach although the distal tip position is not included on the film. Right-sided Impella device is stable in position. A left IJ pulmonary artery catheter tip overlies expected location of the interlobar pulmonary artery. Right pleural drain again noted with persistent right pleural fluid. Basilar collapse/consolidation noted, IMPRESSION: 1. No substantial interval change. 2. Persistent right pleural effusion with right base collapse/consolidation. 3. Support apparatus as above. Electronically Signed   By: Camellia Candle M.D.   On: 12/04/2024 07:15   DG Abd 1 View Result Date: 12/04/2024 CLINICAL DATA:   Routine adult health maintenance Best images obtainable due to patient's condition EXAM: ABDOMEN - 1 VIEW COMPARISON:  12/04/2024 FINDINGS: NG tube tip is in the distal stomach. Diffuse gaseous distention of colon evident, similar to prior. IMPRESSION: NG tube tip is in the distal stomach. Electronically Signed   By: Camellia Candle M.D.   On: 12/04/2024 07:11   CT Angio Chest/Abd/Pel for Dissection W and/or W/WO Addendum Date: 12/03/2024 ADDENDUM #1 ADDENDUM: ---------------------------------------------------- These results were called to Dr. Daniel  at 10:15 pm on 12/03/2024. Electronically signed by: Franky Crease MD 12/03/2024 10:23 PM EST RP Workstation: [id number]   Result Date: 12/03/2024 ORIGINAL REPORT EXAM: CT CHEST, ABDOMEN AND PELVIS WITH AND WITHOUT CONTRAST 12/03/2024 09:52:07 PM TECHNIQUE: CT of the chest, abdomen and pelvis was performed with and without the administration of intravenous contrast. Multiplanar reformatted images are provided for review. Automated exposure control, iterative reconstruction, and/or weight based adjustment of the mA/kV was utilized to reduce the radiation dose to as low as reasonably achievable. COMPARISON: None available. CLINICAL HISTORY:  Acute aortic syndrome (AAS) suspected; Evaluate for intercostal bleeding or other sources after thoracentesis; also evaluate bowel. FINDINGS: CHEST: MEDIASTINUM AND LYMPH NODES: Mild cardiomegaly. Impella device tip in the left ventricle. Swan-ganz catheter tip in the central right pulmonary artery. No evidence of aortic aneurysm or dissection. The central airways are clear. No mediastinal, hilar or axillary lymphadenopathy. LUNGS AND PLEURA: Right chest tube in place. Moderate to large right pleural effusion with compressive atelectasis in the right middle lobe and right lower lobe. There appears to be active extravasation of contrast posteriorly between the right 9th and 10th ribs and likely into the pleural space, possibly related to prior thoracentesis. The pleural fluid appears complex on the right, likely hemothorax.  Small left pleural effusion with compressive atelectasis in the left lower lobe. Tiny right pneumothorax. Biapical subpleural blebs. No evidence of pulmonary embolus. ABDOMEN AND PELVIS: LIVER: Gas is seen branching peripherally in the liver compatible with portal venous gas. GALLBLADDER AND BILE DUCTS: Gallbladder is unremarkable. No biliary ductal dilatation. SPLEEN: No acute abnormality. PANCREAS: No acute abnormality. ADRENAL GLANDS: No acute abnormality. KIDNEYS, URETERS AND BLADDER: No stones in the kidneys or ureters. No hydronephrosis. No perinephric or periureteral stranding. Urinary bladder is unremarkable. GI AND BOWEL: Stomach and small bowel are decompressed. Diffuse gaseous distention of the colon suggests ileus. Moderate stool burden in the rectosigmoid colon. Pneumatosis noted in small bowel loops in the lower pelvis with air in the mesenteric veins feeding these small bowel loops. Pneumatosis also noted in the right colon wall. REPRODUCTIVE ORGANS: No acute abnormality. PERITONEUM AND RETROPERITONEUM: No ascites. No free air. VASCULATURE: Aorta is normal in caliber. ABDOMINAL AND  PELVIS LYMPH NODES: No lymphadenopathy. BONES AND SOFT TISSUES: No acute osseous abnormality. No focal soft tissue abnormality. IMPRESSION: 1. Active contrast extravasation between the right 9th and 10th ribs with likely extension into the pleural space, suspicious for ongoing intercostal bleeding likely related to recent thoracentesis. 2. Moderate to large right pleural effusion compatible with hemothorax with compressive atelectasis in the right middle and lower lobes; right chest tube present; tiny right pneumothorax. 3. No evidence of aortic aneurysm, aortic dissection, or pulmonary embolus. 4. Pneumatosis involving small bowel loops in the lower pelvis and the right colon with mesenteric venous gas and portal venous gas, concerning for bowel ischemia. Recommend surgical consultation. 5. Small left pleural effusion with compressive atelectasis in the left lower lobe. 6. . Attempts are being made to contact the ordering physician with the results. An addendum will be made at that time. Electronically signed by: Franky Crease MD 12/03/2024 10:09 PM EST RP Workstation: [id number]   DG Chest Port 1 View Result Date: 12/03/2024 EXAM: 1 VIEW(S) XRAY OF THE CHEST 12/03/2024 07:57:00 PM COMPARISON: Portable chest today at 7:02 pm. CLINICAL HISTORY: Encounter for chest tube placement. FINDINGS: LINES, TUBES AND DEVICES: A pigtail chest tube has been inserted on the right through the 6th intercostal space, with the pigtail in the lateral upper right thorax. Left IJ Swan-Ganz line again terminates in the distal right pulmonary artery. Stable positioning of right subclavian Impella device. LUNGS AND PLEURA: No pneumothorax. Moderate to large right pleural effusion is still present but decreased in the interval. Small left pleural effusion unchanged. The right mid to lower lung is obscured by pleural fluid. There is patchy consolidation in the left lower lung field. Left mid and both apical lungs are clear. HEART AND  MEDIASTINUM: Sternotomy sutures and two cardiac valve replacements are again shown. There is mild cardiomegaly and mild central vascular prominence as before. BONES AND SOFT TISSUES: No acute osseous abnormality. IMPRESSION: 1. Right pigtail chest tube in position with no measurable pneumothorax. 2. Moderate to large right pleural effusion, decreased compared to earlier today. 3. Patchy consolidation in the left lower lung field. 4. Small left pleural effusion, unchanged. 5. Cardiomegaly . Electronically signed by: Francis Quam MD 12/03/2024 08:10 PM EST RP Workstation: [id number]   DG CHEST PORT 1 VIEW Result Date: 12/03/2024 EXAM: 1 VIEW(S) XRAY OF THE CHEST 12/03/2024 07:05:00 PM COMPARISON: None available. CLINICAL HISTORY: ABLA (acute blood loss anemia). FINDINGS: LINES, TUBES AND DEVICES: Impella device and Swan-Ganz catheter remain in place, unchanged. LUNGS AND PLEURA: Enlarging right pleural effusion, now large with  atelectasis throughout the right lung. Vascular congestion and left basilar atelectasis. Low lung volumes. No pneumothorax. HEART AND MEDIASTINUM: No acute abnormality of the cardiac and mediastinal silhouettes. BONES AND SOFT TISSUES: No acute osseous abnormality. IMPRESSION: 1. Enlarging right pleural effusion, now large, with associated right lung atelectasis. 2. Vascular congestion with left basilar atelectasis. 3. Low lung volumes. Electronically signed by: Franky Crease MD 12/03/2024 07:10 PM EST RP Workstation: [id number]   DG Chest Port 1 View Result Date: 12/03/2024 CLINICAL DATA:  Pleural effusion, status post thoracentesis EXAM: PORTABLE CHEST 1 VIEW COMPARISON:  12/03/2024 FINDINGS: Single frontal view of the chest demonstrates stable left internal jugular flow directed central venous catheter, tip overlying the right infrahilar region. Stable mitral and aortic valve prostheses. Cardiac silhouette remains enlarged. Lung volumes are diminished, with bibasilar veiling opacities,  right greater than left, consistent with consolidation and effusions. No appreciable change since prior study. No evidence of pneumothorax. IMPRESSION: 1. Persistent bibasilar consolidation and effusions, right greater than left. 2. No evidence of pneumothorax. 3. Stable enlarged cardiac silhouette. Electronically Signed   By: Ozell Daring M.D.   On: 12/03/2024 16:10     Medications:     Scheduled Medications:  sodium chloride    Intravenous Once   sodium chloride    Intravenous Once   sodium chloride    Intravenous Once   sodium chloride    Intravenous Once   sodium chloride    Intravenous Once   arformoterol   15 mcg Nebulization BID   Chlorhexidine  Gluconate Cloth  6 each Topical Daily   fentaNYL  (SUBLIMAZE ) injection  25-50 mcg Intravenous Once   folic acid   1 mg Intravenous Daily   insulin  aspart  0-24 Units Subcutaneous Q4H   lidocaine   1 patch Transdermal Q24H   lidocaine -EPINEPHrine   20 mL Intradermal Once   mouth rinse  15 mL Mouth Rinse Q2H   pantoprazole  (PROTONIX ) IV  40 mg Intravenous Daily   revefenacin   175 mcg Nebulization Daily   sodium chloride  flush  3 mL Intravenous Q12H   thiamine  (VITAMIN B1) injection  100 mg Intravenous Daily    Infusions:  sodium chloride  Stopped (12/02/24 1110)   amiodarone  30 mg/hr (12/05/24 0700)   calcium  gluconate     epinephrine  8 mcg/min (12/05/24 0700)   fentaNYL  infusion INTRAVENOUS 100 mcg/hr (12/05/24 0700)   milrinone  0.25 mcg/kg/min (12/05/24 0700)   norepinephrine  (LEVOPHED ) Adult infusion 33 mcg/min (12/05/24 0700)   piperacillin -tazobactam 12.5 mL/hr at 12/05/24 0700   propofol  (DIPRIVAN ) infusion 50 mcg/kg/min (12/05/24 0700)   sodium bicarbonate  25 mEq (Impella PURGE) in dextrose  5 % 1000 mL bag     TPN ADULT (ION) 35 mL/hr at 12/05/24 0700   TPN ADULT (ION)     vasopressin  0.04 Units/min (12/05/24 0700)    PRN Medications: sodium chloride , sodium chloride , albuterol , fentaNYL , fentaNYL  (SUBLIMAZE ) injection,  hydrALAZINE , ondansetron  (ZOFRAN ) IV, mouth rinse, mouth rinse, oxyCODONE , sodium chloride , sodium chloride  flush  Patient Profile  43 y.o. male w/ h/o heavy alcohol use (at least 5 drinks per evening) and h/o asthma admitted w/ acute systolic heart failure. Strong family history of cardiac disease: mother and maternal grandmother had heart failure and father with CAD.    HFrEF. Severe valvular diease.  Assessment/Plan   1.  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  Valvular Disease  - severe MR posteriorly directed, mod TR. Likely functional.  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 Acute Systolic Heart Failure w/ Biventricular Dysfunction  - HS trop not c/w ACS but EKG w/ anteroseptal Qs  - CMRI Severe AR/MR. LVEF 35%  - Cath- normal cors, RA5, PA 32/17 (24), PVR 2.57 Papi 3. CO 4.9 CI 3.8 - Post-op Echo 12/02/24: EF < 20% with severe LVH (new, ?inflammatory), moderately decreased RV function, stable bioprosthetic MV and AoV.  - Fluid resuscitation with bleeding on 12/11 and Lasix  stopped. CVP 13 today, CI 2.4 with normal lactate.  - Today on milrinone  0.25 mcg, Epi @8 , NE @31  and vaso @ 0.04.  We are now weaning down NE.  - Impella @ P7 Flow 3.7.  No heparin  gtt with bleeding, HCO3 purge only.  With completed fluid resuscitation, I increased Impella back to P8 this morning giving flow of 4.2 L/min.  - Hold digoxin  - Holding GDMT until back off pressors.  - Will reassess in afternoon, will likely need to start diuresis again at that time.   3. R hemothorax - Hemothorax s/p right thoracentesis with intercostal artery injury. Massive bleeding requiring multiple products and development of hemorrhagic shock, now improved. S/p IR embolization.  - Hgb 8.9>7.8 >9.2>7.5>>>10.2 - Transfuse hgb < 8.  - heparin  remains on hold.   4. Transaminates - Hepatitis panel negative. RUQ US  unremarkable  -  suspect 2/2 CHF/hepatic congestion + chronic ETOH use    5.  ETOH Abuse - heavy drinker, at least 5 drinks per evening - may be contributing to CM, reduction of intake imperative  - No evidence of withdrawal   6  Hypomagnesemia - follow; goal >2  - replete   7 . DMII  - Hgb A1C 6.1 - On SSI.  8. Ischemic bowel - CXR with dilated bowels.  - CT scan demonstrated active extravasation from a posterior intercostal artery. Also demonstrated portal venous gas and pneumatosis of the small and large bowel.  - S/p exp/lap 12/11. Patchy ischemia of the distal ileum extending over about 75 cm without perforation. Colon was distended with gas but viable. Small bowel left in discontinuity.  - Plan for re-exploration tomorrow.   9. ID -  Covering bowel pathogens with Zosyn .   10. AKI - AKI with shock, creatinine lower at 1.58 today.   11. FEN - TPN  12. Rhythm - Today in accelerated junctional rhythm with retrograde P's noted in ST segment.   CRITICAL CARE Performed by: Ezra Shuck   Total critical care time: 45 minutes  Critical care time was exclusive of separately billable procedures and treating other patients.  Critical care was necessary to treat or prevent imminent or life-threatening deterioration.  Critical care was time spent personally by me on the following activities: development of treatment plan with patient and/or surrogate as well as nursing, discussions with consultants, evaluation of patient's response to treatment, examination of patient, obtaining history from patient or surrogate, ordering and performing treatments and interventions, ordering and review of laboratory studies, ordering and review of radiographic studies, pulse oximetry and re-evaluation of patient's condition.    Length of Stay: 8  Ezra Shuck, MD  12/05/2024, 8:06 AM  Advanced Heart Failure Team Pager [phone] (M-F; 7a - 5p)  Please contact [facility] for night-coverage after  hours (5p -7a ) and weekends on [email or web address]

## 2024-12-06 ENCOUNTER — Inpatient Hospital Stay (HOSPITAL_COMMUNITY): Admitting: Anesthesiology

## 2024-12-06 ENCOUNTER — Inpatient Hospital Stay (HOSPITAL_COMMUNITY)

## 2024-12-06 ENCOUNTER — Encounter (HOSPITAL_COMMUNITY)
Admission: EM | Disposition: A | Discharge status: Rehabilitation Facility | Payer: Self-pay | Source: Home / Self Care | Attending: Surgery

## 2024-12-06 ENCOUNTER — Encounter (HOSPITAL_COMMUNITY): Payer: Self-pay | Admitting: Surgery

## 2024-12-06 DIAGNOSIS — I5021 Acute systolic (congestive) heart failure: Secondary | ICD-10-CM | POA: Diagnosis not present

## 2024-12-06 LAB — PROTIME-INR
INR: 1.2 (ref 0.8–1.2)
Prothrombin Time: 15.6 s — ABNORMAL HIGH (ref 11.4–15.2)

## 2024-12-06 LAB — POCT I-STAT 7, (LYTES, BLD GAS, ICA,H+H)
Acid-Base Excess: 1 mmol/L (ref 0.0–2.0)
Acid-base deficit: 2 mmol/L (ref 0.0–2.0)
Acid-base deficit: 1 mmol/L (ref 0.0–2.0)
Bicarbonate: 24.7 mmol/L (ref 20.0–28.0)
Bicarbonate: 26.8 mmol/L (ref 20.0–28.0)
Bicarbonate: 25.5 mmol/L (ref 20.0–28.0)
Calcium, Ion: 1.18 mmol/L (ref 1.15–1.40)
Calcium, Ion: 1.17 mmol/L (ref 1.15–1.40)
Calcium, Ion: 1.12 mmol/L — ABNORMAL LOW (ref 1.15–1.40)
HCT: 28 % — ABNORMAL LOW (ref 39.0–52.0)
HCT: 26 % — ABNORMAL LOW (ref 39.0–52.0)
HCT: 33 % — ABNORMAL LOW (ref 39.0–52.0)
Hemoglobin: 9.5 g/dL — ABNORMAL LOW (ref 13.0–17.0)
Hemoglobin: 8.8 g/dL — ABNORMAL LOW (ref 13.0–17.0)
Hemoglobin: 11.2 g/dL — ABNORMAL LOW (ref 13.0–17.0)
O2 Saturation: 92 %
O2 Saturation: 100 %
O2 Saturation: 100 %
Patient temperature: 38
Patient temperature: 37.4
Potassium: 4 mmol/L (ref 3.5–5.1)
Potassium: 3.8 mmol/L (ref 3.5–5.1)
Potassium: 3.8 mmol/L (ref 3.5–5.1)
Sodium: 133 mmol/L — ABNORMAL LOW (ref 135–145)
Sodium: 133 mmol/L — ABNORMAL LOW (ref 135–145)
Sodium: 133 mmol/L — ABNORMAL LOW (ref 135–145)
TCO2: 26 mmol/L (ref 22–32)
TCO2: 28 mmol/L (ref 22–32)
TCO2: 27 mmol/L (ref 22–32)
pCO2 arterial: 49.7 mmHg — ABNORMAL HIGH (ref 32–48)
pCO2 arterial: 50.7 mmHg — ABNORMAL HIGH (ref 32–48)
pCO2 arterial: 48.6 mmHg — ABNORMAL HIGH (ref 32–48)
pH, Arterial: 7.309 — ABNORMAL LOW (ref 7.35–7.45)
pH, Arterial: 7.332 — ABNORMAL LOW (ref 7.35–7.45)
pH, Arterial: 7.329 — ABNORMAL LOW (ref 7.35–7.45)
pO2, Arterial: 75 mmHg — ABNORMAL LOW (ref 83–108)
pO2, Arterial: 342 mmHg — ABNORMAL HIGH (ref 83–108)
pO2, Arterial: 218 mmHg — ABNORMAL HIGH (ref 83–108)

## 2024-12-06 LAB — CBC
HCT: 27.4 % — ABNORMAL LOW (ref 39.0–52.0)
Hemoglobin: 9.7 g/dL — ABNORMAL LOW (ref 13.0–17.0)
MCH: 30.1 pg (ref 26.0–34.0)
MCHC: 35.4 g/dL (ref 30.0–36.0)
MCV: 85.1 fL (ref 80.0–100.0)
Platelets: 72 K/uL — ABNORMAL LOW (ref 150–400)
RBC: 3.22 MIL/uL — ABNORMAL LOW (ref 4.22–5.81)
RDW: 17.6 % — ABNORMAL HIGH (ref 11.5–15.5)
WBC: 14.3 K/uL — ABNORMAL HIGH (ref 4.0–10.5)
nRBC: 2.4 % — ABNORMAL HIGH (ref 0.0–0.2)

## 2024-12-06 LAB — BASIC METABOLIC PANEL WITH GFR
Anion gap: 9 (ref 5–15)
BUN: 25 mg/dL — ABNORMAL HIGH (ref 6–20)
CO2: 27 mmol/L (ref 22–32)
Calcium: 8.2 mg/dL — ABNORMAL LOW (ref 8.9–10.3)
Chloride: 96 mmol/L — ABNORMAL LOW (ref 98–111)
Creatinine, Ser: 0.92 mg/dL (ref 0.61–1.24)
GFR, Estimated: >60 mL/min (ref >60)
Glucose, Bld: 131 mg/dL — ABNORMAL HIGH (ref 70–99)
Potassium: 4.1 mmol/L (ref 3.5–5.1)
Sodium: 132 mmol/L — ABNORMAL LOW (ref 135–145)

## 2024-12-06 LAB — COOXEMETRY PANEL
Carboxyhemoglobin: 2.3 % — ABNORMAL HIGH (ref 0.5–1.5)
Methemoglobin: <0.7 % (ref 0.0–1.5)
O2 Saturation: 62.8 %
Total hemoglobin: 9.9 g/dL — ABNORMAL LOW (ref 12.0–16.0)

## 2024-12-06 LAB — GLUCOSE, CAPILLARY
Glucose-Capillary: 123 mg/dL — ABNORMAL HIGH (ref 70–99)
Glucose-Capillary: 130 mg/dL — ABNORMAL HIGH (ref 70–99)
Glucose-Capillary: 134 mg/dL — ABNORMAL HIGH (ref 70–99)
Glucose-Capillary: 141 mg/dL — ABNORMAL HIGH (ref 70–99)
Glucose-Capillary: 143 mg/dL — ABNORMAL HIGH (ref 70–99)
Glucose-Capillary: 145 mg/dL — ABNORMAL HIGH (ref 70–99)

## 2024-12-06 LAB — LACTATE DEHYDROGENASE: LDH: 299 U/L — ABNORMAL HIGH (ref 105–235)

## 2024-12-06 LAB — PHOSPHORUS: Phosphorus: 3.3 mg/dL (ref 2.5–4.6)

## 2024-12-06 LAB — MAGNESIUM: Magnesium: 2 mg/dL (ref 1.7–2.4)

## 2024-12-06 LAB — PREPARE RBC (CROSSMATCH)

## 2024-12-06 MED ORDER — FENTANYL CITRATE (PF) 250 MCG/5ML IJ SOLN
INTRAMUSCULAR | Status: AC
  Filled 2024-12-06: qty 5

## 2024-12-06 MED ORDER — MIDAZOLAM HCL (PF) 2 MG/2ML IJ SOLN
INTRAMUSCULAR | Status: DC | PRN
  Administered 2024-12-06: 2 mg via INTRAVENOUS

## 2024-12-06 MED ORDER — ROCURONIUM BROMIDE 10 MG/ML (PF) SYRINGE
PREFILLED_SYRINGE | INTRAVENOUS | Status: DC | PRN
  Administered 2024-12-06 (×2): 100 mg via INTRAVENOUS

## 2024-12-06 MED ORDER — ROCURONIUM BROMIDE 10 MG/ML (PF) SYRINGE
PREFILLED_SYRINGE | INTRAVENOUS | Status: AC
  Filled 2024-12-06: qty 10

## 2024-12-06 MED ORDER — VANCOMYCIN HCL 1250 MG/250ML IV SOLN
1250.0000 mg | Freq: Two times a day (BID) | INTRAVENOUS | Status: DC
  Administered 2024-12-06 – 2024-12-12 (×13): 1250 mg via INTRAVENOUS
  Filled 2024-12-06 (×14): qty 250

## 2024-12-06 MED ORDER — SODIUM CHLORIDE 0.9 % IV SOLN: INTRAVENOUS | Status: DC | PRN

## 2024-12-06 MED ORDER — DEXMEDETOMIDINE HCL IN NACL 80 MCG/20ML IV SOLN
INTRAVENOUS | Status: AC
  Filled 2024-12-06: qty 20

## 2024-12-06 MED ORDER — MIDAZOLAM HCL 2 MG/2ML IJ SOLN
INTRAMUSCULAR | Status: AC
  Filled 2024-12-06: qty 2

## 2024-12-06 MED ORDER — SODIUM CHLORIDE 0.9% IV SOLUTION: Freq: Once | INTRAVENOUS | Status: DC

## 2024-12-06 MED ORDER — VANCOMYCIN HCL 1750 MG/350ML IV SOLN
1750.0000 mg | Freq: Once | INTRAVENOUS | Status: AC
  Administered 2024-12-06: 1750 mg via INTRAVENOUS
  Filled 2024-12-06: qty 350

## 2024-12-06 MED ORDER — FENTANYL CITRATE (PF) 250 MCG/5ML IJ SOLN
INTRAMUSCULAR | Status: DC | PRN
  Administered 2024-12-06: 50 ug via INTRAVENOUS

## 2024-12-06 MED ORDER — 0.9 % SODIUM CHLORIDE (POUR BTL) OPTIME
TOPICAL | Status: DC | PRN
  Administered 2024-12-06 (×2): 1000 mL

## 2024-12-06 MED ORDER — LACTATED RINGERS IV SOLN: INTRAVENOUS | Status: DC | PRN

## 2024-12-06 MED ORDER — HEMOSTATIC AGENTS (NO CHARGE) OPTIME
TOPICAL | Status: DC | PRN
  Administered 2024-12-06: 1 via TOPICAL

## 2024-12-06 MED ORDER — TRACE MINERALS CU-MN-SE-ZN 300-55-60-3000 MCG/ML IV SOLN
INTRAVENOUS | Status: AC
  Filled 2024-12-06: qty 864

## 2024-12-06 MED ADMIN — Epinephrine PF Inj 1 MG/ML: 8 ug/min | INTRAVENOUS | NDC 99999070065

## 2024-12-06 NOTE — Anesthesia Preprocedure Evaluation (Signed)
 Anesthesia Evaluation  Patient identified by MRN, date of birth, ID band Patient unresponsive    Reviewed: Allergy & Precautions, H&P , NPO status , Patient's Chart, lab work & pertinent test results  Airway Mallampati: Intubated       Dental no notable dental hx. (+) Teeth Intact, Dental Advisory Given   Pulmonary asthma , Current Smoker and Patient abstained from smoking. Intubated   Pulmonary exam normal breath sounds clear to auscultation       Cardiovascular hypertension, +CHF   Rhythm:Regular Rate:Normal  Impella in place.   Neuro/Psych negative neurological ROS  negative psych ROS   GI/Hepatic negative GI ROS, Neg liver ROS,,,  Endo/Other  negative endocrine ROS    Renal/GU negative Renal ROS  negative genitourinary   Musculoskeletal   Abdominal   Peds  Hematology negative hematology ROS (+)   Anesthesia Other Findings   Reproductive/Obstetrics negative OB ROS                              Anesthesia Physical Anesthesia Plan  ASA: 4  Anesthesia Plan: General   Post-op Pain Management:    Induction: Intravenous  PONV Risk Score and Plan: 1 and Treatment may vary due to age or medical condition  Airway Management Planned: Oral ETT  Additional Equipment:   Intra-op Plan:   Post-operative Plan: Post-operative intubation/ventilation  Informed Consent: I have reviewed the patients History and Physical, chart, labs and discussed the procedure including the risks, benefits and alternatives for the proposed anesthesia with the patient or authorized representative who has indicated his/her understanding and acceptance.     Dental advisory given  Plan Discussed with: CRNA  Anesthesia Plan Comments:         Anesthesia Quick Evaluation

## 2024-12-06 NOTE — Procedures (Signed)
 Arterial Line Insertion Start/End12/14/2025 1:50 PM, 12/06/2024 2:20 PM  Patient location: ICU. Preanesthetic checklist: patient identified, IV checked, site marked, risks and benefits discussed, surgical consent, monitors and equipment checked, pre-op evaluation and timeout performed Right, radial was placed Catheter size: 20 G Hand hygiene performed  and maximum sterile barriers used  Allen's test indicative of satisfactory collateral circulation Attempts: 1 Following insertion, dressing applied and Biopatch. Post procedure assessment: normal and unchanged  Patient tolerated the procedure well with no immediate complications.

## 2024-12-06 NOTE — Anesthesia Postprocedure Evaluation (Signed)
 Anesthesia Post Note  Patient: Alejandro Lopez  Procedure(s) Performed: LAPAROTOMY, EXPLORATORY; WOUND VAC CHANGE (Abdomen) COLECTOMY, RIGHT (Right: Abdomen)     Patient location during evaluation: SICU Anesthesia Type: General Level of consciousness: sedated Pain management: pain level controlled Vital Signs Assessment: post-procedure vital signs reviewed and stable Respiratory status: patient remains intubated per anesthesia plan Cardiovascular status: stable Postop Assessment: no apparent nausea or vomiting Anesthetic complications: no   No notable events documented.  Last Vitals:  Vitals:   12/06/24 1445 12/06/24 1500  BP: (!) 119/91 114/89  Pulse: 94 92  Resp: (!) 22 (!) 22  Temp: 37.4 C 37.4 C  SpO2: 96% 96%    Last Pain:  Vitals:   12/06/24 0800  TempSrc: Bladder  PainSc:                  Oluwanifemi Susman,W. EDMOND

## 2024-12-06 NOTE — Progress Notes (Signed)
 Patient ID: Alejandro Lopez, male   DOB: [birth year], 43 y.o.   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  12/9 VT/VF ? vagal episode. Extubated 12/11: S/p R thoracentesis with resultant hemothorax.  Chest tube placed. S/p R intercostal artery coil (IR). S/p exp lap for ischemic bowel resection, bowel left in discontinuity  No further blood products, hgb 9.7 today.  Chest tube output slowing.  Not on anticoagulation.  Plts 92 => 102 => 72.   Impella 5.5 P8 Flow 4.1 L/min No alarms LDH 241 => 299  No heparin , getting HCO3 purge only.   Swan: CVP 13-14 PA 54/24 CI 3.2 Co-ox 63%  Milrinone  0.25 mcg, Epi @8 , NE @10  (down significantly) and vaso @ 0.04.  UOP 2355 with no diuretic.   Intubated and sedated, on Zosyn  + vancomycin  with Tm 101.3.   Telemetry looks like accelerated junctional with retrograde P's   Objective:   Weight Range: 81.3 kg Body mass index is 25.72 kg/m.   Vital Signs:   Temp:  [98.8 F (37.1 C)-101.3 F (38.5 C)] 100.6 F (38.1 C) (12/14 0730) Pulse Rate:  [93-114] 104 (12/14 0730) Resp:  [0-24] 16 (12/14 0730) BP: (82-117)/(51-100) 108/78 (12/14 0730) SpO2:  [90 %-100 %] 95 % (12/14 0730) Arterial Line BP: (43-120)/(28-95) 98/69 (12/14 0715) FiO2 (%):  [40 %] 40 % (12/14 0400) Weight:  [81.3 kg] 81.3 kg (12/14 0500) Last BM Date : 12/05/24  Weight change: Filed Weights   12/03/24 0500 12/05/24 0500 12/06/24 0500  Weight: 75.9 kg 80.8 kg 81.3 kg    Intake/Output:   Intake/Output Summary (Last 24 hours) at 12/06/2024 0810 Last data filed at 12/06/2024 0800 Gross per 24 hour  Intake 4011.79 ml  Output 3410 ml  Net 601.79 ml    CVP 13-14 Physical Exam   General: Sedated on vent.  Neck: JVP 12 cm, no thyromegaly or thyroid nodule.  Lungs: Decreased at bases.  CV: Nondisplaced  PMI.  Heart regular S1/S2, no S3/S4, no murmur.  No peripheral edema.   Abdomen: Wound vac, not distended.   Skin: Intact without lesions or rashes.  Neurologic: Sedated on vent.  Extremities: No clubbing or cyanosis.  HEENT: Normal.    Telemetry   Accelerated junctional 90s (Personally reviewed)    Labs    CBC Recent Labs    12/05/24 1422 12/06/24 0352 12/06/24 0612  WBC 11.4* 14.3*  --   HGB 10.1* 9.7* 9.5*  HCT 27.9* 27.4* 28.0*  MCV 83.5 85.1  --   PLT 84* 72*  --    Basic Metabolic Panel Recent Labs    87/86/74 0212 12/05/24 0621 12/05/24 1422 12/06/24 0352 12/06/24 0612  NA 131*   < > 130* 132* 133*  K 4.8   < > 4.3 4.1 4.0  CL 96*  --  96* 96*  --   CO2 24  --  22 27  --   GLUCOSE 140*  --  117* 131*  --   BUN 28*  --  29* 25*  --   CREATININE 1.58*  --  1.22 0.92  --   CALCIUM  7.6*  --  8.5* 8.2*  --   MG 2.1  --   --  2.0  --   PHOS 6.8*  --   --  3.3  --    < > =  values in this interval not displayed.   Liver Function Tests Recent Labs    12/05/24 0212  AST 88*  ALT 49*  ALKPHOS 45  BILITOT 2.3*  PROT 4.8*  ALBUMIN  2.9*   No results for input(s): LIPASE, AMYLASE in the last 72 hours. Cardiac Enzymes No results for input(s): CKTOTAL, CKMB, CKMBINDEX, TROPONINI in the last 72 hours.  BNP: BNP (last 3 results) No results for input(s): BNP in the last 8760 hours.  ProBNP (last 3 results) Recent Labs    11/22/24 1958  PROBNP 3,354.0*     D-Dimer Recent Labs    12/03/24 2214 12/04/24 0435  DDIMER 2.18* 2.32*   Hemoglobin A1C No results for input(s): HGBA1C in the last 72 hours. Fasting Lipid Panel Recent Labs    12/05/24 0212  TRIG 99   Thyroid Function Tests No results for input(s): TSH, T4TOTAL, T3FREE, THYROIDAB in the last 72 hours.  Invalid input(s): FREET3  Other results:   Imaging    DG CHEST PORT 1 VIEW Result Date: 12/04/2024 CLINICAL DATA:  Ventilator dependence. EXAM: PORTABLE  CHEST 1 VIEW COMPARISON:  12/03/2024 FINDINGS: Endotracheal tube tip is approximately 3.8 cm above the base of the carina. The NG tube passes into the stomach although the distal tip position is not included on the film. Right-sided Impella device is stable in position. A left IJ pulmonary artery catheter tip overlies expected location of the interlobar pulmonary artery. Right pleural drain again noted with persistent right pleural fluid. Basilar collapse/consolidation noted, IMPRESSION: 1. No substantial interval change. 2. Persistent right pleural effusion with right base collapse/consolidation. 3. Support apparatus as above. Electronically Signed   By: Camellia Candle M.D.   On: 12/04/2024 07:15   DG Abd 1 View Result Date: 12/04/2024 CLINICAL DATA:   Routine adult health maintenance Best images obtainable due to patient's condition EXAM: ABDOMEN - 1 VIEW COMPARISON:  12/04/2024 FINDINGS: NG tube tip is in the distal stomach. Diffuse gaseous distention of colon evident, similar to prior. IMPRESSION: NG tube tip is in the distal stomach. Electronically Signed   By: Camellia Candle M.D.   On: 12/04/2024 07:11   CT Angio Chest/Abd/Pel for Dissection W and/or W/WO Addendum Date: 12/03/2024 ADDENDUM #1 ADDENDUM: ---------------------------------------------------- These results were called to Dr. Daniel  at 10:15 pm on 12/03/2024. Electronically signed by: Franky Crease MD 12/03/2024 10:23 PM EST RP Workstation: [id number]   Result Date: 12/03/2024 ORIGINAL REPORT EXAM: CT CHEST, ABDOMEN AND PELVIS WITH AND WITHOUT CONTRAST 12/03/2024 09:52:07 PM TECHNIQUE: CT of the chest, abdomen and pelvis was performed with and without the administration of intravenous contrast. Multiplanar reformatted images are provided for review. Automated exposure control, iterative reconstruction, and/or weight based adjustment of the mA/kV was utilized to reduce the radiation dose to as low as reasonably achievable. COMPARISON: None  available. CLINICAL HISTORY: Acute aortic syndrome (AAS) suspected; Evaluate for intercostal bleeding or other sources after thoracentesis; also evaluate bowel. FINDINGS: CHEST: MEDIASTINUM AND LYMPH NODES: Mild cardiomegaly. Impella device tip in the left ventricle. Swan-ganz catheter tip in the central right pulmonary artery. No evidence of aortic aneurysm or dissection. The central airways are clear. No mediastinal, hilar or axillary lymphadenopathy. LUNGS AND PLEURA: Right chest tube in place. Moderate to large right pleural effusion with compressive atelectasis in the right middle lobe and right lower lobe. There appears to be active extravasation of contrast posteriorly between the right 9th and 10th ribs and likely into the pleural space, possibly related to prior thoracentesis. The pleural  fluid appears complex on the right, likely hemothorax. Small left pleural effusion with compressive atelectasis in the left lower lobe. Tiny right pneumothorax. Biapical subpleural blebs. No evidence of pulmonary embolus. ABDOMEN AND PELVIS: LIVER: Gas is seen branching peripherally in the liver compatible with portal venous gas. GALLBLADDER AND BILE DUCTS: Gallbladder is unremarkable. No biliary ductal dilatation. SPLEEN: No acute abnormality. PANCREAS: No acute abnormality. ADRENAL GLANDS: No acute abnormality. KIDNEYS, URETERS AND BLADDER: No stones in the kidneys or ureters. No hydronephrosis. No perinephric or periureteral stranding. Urinary bladder is unremarkable. GI AND BOWEL: Stomach and small bowel are decompressed. Diffuse gaseous distention of the colon suggests ileus. Moderate stool burden in the rectosigmoid colon. Pneumatosis noted in small bowel loops in the lower pelvis with air in the mesenteric veins feeding these small bowel loops. Pneumatosis also noted in the right colon wall. REPRODUCTIVE ORGANS: No acute abnormality. PERITONEUM AND RETROPERITONEUM: No ascites. No free air. VASCULATURE: Aorta is  normal in caliber. ABDOMINAL AND PELVIS LYMPH NODES: No lymphadenopathy. BONES AND SOFT TISSUES: No acute osseous abnormality. No focal soft tissue abnormality. IMPRESSION: 1. Active contrast extravasation between the right 9th and 10th ribs with likely extension into the pleural space, suspicious for ongoing intercostal bleeding likely related to recent thoracentesis. 2. Moderate to large right pleural effusion compatible with hemothorax with compressive atelectasis in the right middle and lower lobes; right chest tube present; tiny right pneumothorax. 3. No evidence of aortic aneurysm, aortic dissection, or pulmonary embolus. 4. Pneumatosis involving small bowel loops in the lower pelvis and the right colon with mesenteric venous gas and portal venous gas, concerning for bowel ischemia. Recommend surgical consultation. 5. Small left pleural effusion with compressive atelectasis in the left lower lobe. 6. . Attempts are being made to contact the ordering physician with the results. An addendum will be made at that time. Electronically signed by: Franky Crease MD 12/03/2024 10:09 PM EST RP Workstation: [id number]   DG Chest Port 1 View Result Date: 12/03/2024 EXAM: 1 VIEW(S) XRAY OF THE CHEST 12/03/2024 07:57:00 PM COMPARISON: Portable chest today at 7:02 pm. CLINICAL HISTORY: Encounter for chest tube placement. FINDINGS: LINES, TUBES AND DEVICES: A pigtail chest tube has been inserted on the right through the 6th intercostal space, with the pigtail in the lateral upper right thorax. Left IJ Swan-Ganz line again terminates in the distal right pulmonary artery. Stable positioning of right subclavian Impella device. LUNGS AND PLEURA: No pneumothorax. Moderate to large right pleural effusion is still present but decreased in the interval. Small left pleural effusion unchanged. The right mid to lower lung is obscured by pleural fluid. There is patchy consolidation in the left lower lung field. Left mid and both apical  lungs are clear. HEART AND MEDIASTINUM: Sternotomy sutures and two cardiac valve replacements are again shown. There is mild cardiomegaly and mild central vascular prominence as before. BONES AND SOFT TISSUES: No acute osseous abnormality. IMPRESSION: 1. Right pigtail chest tube in position with no measurable pneumothorax. 2. Moderate to large right pleural effusion, decreased compared to earlier today. 3. Patchy consolidation in the left lower lung field. 4. Small left pleural effusion, unchanged. 5. Cardiomegaly . Electronically signed by: Francis Quam MD 12/03/2024 08:10 PM EST RP Workstation: [id number]   DG CHEST PORT 1 VIEW Result Date: 12/03/2024 EXAM: 1 VIEW(S) XRAY OF THE CHEST 12/03/2024 07:05:00 PM COMPARISON: None available. CLINICAL HISTORY: ABLA (acute blood loss anemia). FINDINGS: LINES, TUBES AND DEVICES: Impella device and Swan-Ganz catheter remain in place, unchanged. LUNGS AND  PLEURA: Enlarging right pleural effusion, now large with atelectasis throughout the right lung. Vascular congestion and left basilar atelectasis. Low lung volumes. No pneumothorax. HEART AND MEDIASTINUM: No acute abnormality of the cardiac and mediastinal silhouettes. BONES AND SOFT TISSUES: No acute osseous abnormality. IMPRESSION: 1. Enlarging right pleural effusion, now large, with associated right lung atelectasis. 2. Vascular congestion with left basilar atelectasis. 3. Low lung volumes. Electronically signed by: Franky Crease MD 12/03/2024 07:10 PM EST RP Workstation: [id number]   DG Chest Port 1 View Result Date: 12/03/2024 CLINICAL DATA:  Pleural effusion, status post thoracentesis EXAM: PORTABLE CHEST 1 VIEW COMPARISON:  12/03/2024 FINDINGS: Single frontal view of the chest demonstrates stable left internal jugular flow directed central venous catheter, tip overlying the right infrahilar region. Stable mitral and aortic valve prostheses. Cardiac silhouette remains enlarged. Lung volumes are diminished, with  bibasilar veiling opacities, right greater than left, consistent with consolidation and effusions. No appreciable change since prior study. No evidence of pneumothorax. IMPRESSION: 1. Persistent bibasilar consolidation and effusions, right greater than left. 2. No evidence of pneumothorax. 3. Stable enlarged cardiac silhouette. Electronically Signed   By: Ozell Daring M.D.   On: 12/03/2024 16:10     Medications:     Scheduled Medications:  sodium chloride    Intravenous Once   sodium chloride    Intravenous Once   sodium chloride    Intravenous Once   sodium chloride    Intravenous Once   sodium chloride    Intravenous Once   arformoterol   15 mcg Nebulization BID   Chlorhexidine  Gluconate Cloth  6 each Topical Daily   fentaNYL  (SUBLIMAZE ) injection  25-50 mcg Intravenous Once   folic acid   1 mg Intravenous Daily   insulin  aspart  0-24 Units Subcutaneous Q4H   lidocaine   1 patch Transdermal Q24H   lidocaine -EPINEPHrine   20 mL Intradermal Once   mouth rinse  15 mL Mouth Rinse Q2H   pantoprazole  (PROTONIX ) IV  40 mg Intravenous Daily   revefenacin   175 mcg Nebulization Daily   sodium chloride  flush  3 mL Intravenous Q12H   thiamine  (VITAMIN B1) injection  100 mg Intravenous Daily    Infusions:  sodium chloride  Stopped (12/02/24 1110)   amiodarone  30 mg/hr (12/06/24 0700)   epinephrine  8 mcg/min (12/06/24 0700)   fentaNYL  infusion INTRAVENOUS 100 mcg/hr (12/06/24 0700)   milrinone  0.25 mcg/kg/min (12/06/24 0700)   norepinephrine  (LEVOPHED ) Adult infusion 10 mcg/min (12/06/24 0700)   piperacillin -tazobactam 12.5 mL/hr at 12/06/24 0700   propofol  (DIPRIVAN ) infusion 40 mcg/kg/min (12/06/24 0700)   sodium bicarbonate  25 mEq (Impella PURGE) in dextrose  5 % 1000 mL bag     TPN ADULT (ION) 60 mL/hr at 12/06/24 0700   vancomycin      vasopressin  0.04 Units/min (12/06/24 0700)    PRN Medications: sodium chloride , sodium chloride , albuterol , fentaNYL , fentaNYL  (SUBLIMAZE ) injection,  hydrALAZINE , ondansetron  (ZOFRAN ) IV, mouth rinse, mouth rinse, oxyCODONE , sodium chloride , sodium chloride  flush  Patient Profile  43 y.o. male w/ h/o heavy alcohol use (at least 5 drinks per evening) and h/o asthma admitted w/ acute systolic heart failure. Strong family history of cardiac disease: mother and maternal grandmother had heart failure and father with CAD.    HFrEF. Severe valvular diease.  Assessment/Plan   1.  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  Valvular Disease  - severe MR posteriorly directed, mod TR. Likely functional.  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 Acute Systolic Heart Failure w/ Biventricular Dysfunction  - HS trop not c/w ACS but EKG w/ anteroseptal Qs  - CMRI Severe AR/MR. LVEF 35%  - Cath- normal cors, RA5, PA 32/17 (24), PVR 2.57 Papi 3. CO 4.9 CI 3.8 - Post-op Echo 12/02/24: EF < 20% with severe LVH (new, ?inflammatory), moderately decreased RV function, stable bioprosthetic MV and AoV.  - Fluid resuscitation with bleeding on 12/11 and Lasix  stopped. CVP 13-14 today, CI 3.2 with co-ox 63%. - Today on milrinone  0.25 mcg, Epi @8 , NE @10  and vaso @ 0.04.  NE has been weaned down significantly.  Would work on weaning down vasopressin  today then NE further.  Would leave epinephrine  for 3rd given RV dysfunction.  - Impella @ P8 Flow 4.1.  No heparin  gtt with bleeding, HCO3 purge only.   - Hold digoxin  - Holding GDMT until back off pressors.  - Start diuresis after he returns to OR today, would use Lasix  infusion.   3. R hemothorax - Hemothorax s/p right thoracentesis with intercostal artery injury. Massive bleeding requiring multiple products and development of hemorrhagic shock, now improved. S/p IR embolization.  - Hgb 8.9>7.8 >9.2>7.5>>>10.2>9.7 - Transfuse hgb < 8.  - heparin  remains on hold.  - Chest tube in place, output has slowed.   4.  Transaminates - Hepatitis panel negative. RUQ US  unremarkable  - suspect 2/2 CHF/hepatic congestion + chronic ETOH use    5.  ETOH Abuse - heavy drinker, at least 5 drinks per evening - may be contributing to CM, reduction of intake imperative  - No evidence of withdrawal   6  Hypomagnesemia - follow; goal >2  - replete   7 . DMII  - Hgb A1C 6.1 - On SSI.  8. Ischemic bowel - CXR with dilated bowels.  - CT scan demonstrated active extravasation from a posterior intercostal artery. Also demonstrated portal venous gas and pneumatosis of the small and large bowel.  - S/p exp/lap 12/11. Patchy ischemia of the distal ileum extending over about 75 cm without perforation. Ischemic segment resected.  Colon was distended with gas but viable. Small bowel left in discontinuity.  - Back to OR today for re-exploration, ?bowel re-anastomosis versus need to remove more of the small bowel.   9. ID - Covering bowel pathogens with Zosyn .  - Tm 101.3, adding vancomycin .   10. AKI - AKI with shock, creatinine lower at 1.58 => 0.92 today.   11. FEN - TPN  12. Rhythm - Today in accelerated junctional rhythm with retrograde P's noted in ST segment.   CRITICAL CARE Performed by: Ezra Shuck   Total critical care time: 45 minutes  Critical care time was exclusive of separately billable procedures and treating other patients.  Critical care was necessary to treat or prevent imminent or life-threatening deterioration.  Critical care was time spent personally by me on the following activities: development of treatment plan with patient and/or surrogate as well as nursing, discussions with consultants, evaluation of patient's response to treatment, examination of patient, obtaining history from patient or surrogate, ordering and performing treatments and interventions, ordering and review of laboratory studies, ordering and review of radiographic studies, pulse oximetry and re-evaluation of  patient's condition.    Length of Stay: 30  Ezra Shuck, MD  12/06/2024, 8:10 AM  Advanced Heart Failure Team Pager [phone] (M-F; 7a - 5p)  Please contact [facility] for night-coverage after hours (5p -7a ) and weekends on [email or web address]

## 2024-12-06 NOTE — Op Note (Signed)
 11/22/2024 - 12/06/2024  1:08 PM  PATIENT:  Alejandro Lopez  43 y.o. male  PRE-OPERATIVE DIAGNOSIS:  OPEN ABDOMEN, history of small bowel ischemia  POST-OPERATIVE DIAGNOSIS:  ischemic right colon, mesenteric ischemia  PROCEDURE:  Procedures: Re-exploration of abdomen Right colectomy without anastomosis  Placement of Abthera wound vac (02393 Negative pressure wound therapy (vacuum-assisted drainage collection), utilizing nondisposable durable medical equipment (DME) on an open wound, including topical application, wound assessment, and instructions for ongoing care; total wound surface area greater than 50 cm)  SURGEON:  Surgeon(s):  Tanda Locus, MD FACS  ASSISTANTS: Krystal Spinner MD FACS (a qualified medical assistant was requested to help with the case due to the complexity of the anatomy, retraction of tissue, and to help expedite the case given the patient's severe critical illness)  ANESTHESIA:   general  EBL: <50 ml  Blood: 1 unit of PRBC  DRAINS: Nasogastric Tube and Urinary Catheter (Foley)   LOCAL MEDICATIONS USED:  NONE  SPECIMEN:  Source of Specimen:  right colon/distal ileum  DISPOSITION OF SPECIMEN:  PATHOLOGY  COUNTS:  YES  INDICATION FOR PROCEDURE: This is a gentleman with severe heart disease who underwent Impella placement who developed abdominal pain and was taken to the OR and was found to have mesenteric ischemia mainly involving his distal small bowel.  He underwent resection of the small bowel and he was left in discontinuity for second look for ongoing ischemia.  He was brought back to the operating room today for evaluation and exploration.  Please see chart for additional details  Findings: The distal ileum was necrotic, portions of the cecum were necrotic and portions of the ascending colon were necrotic mainly on the anterior lateral aspect.  We ended up resecting the right colon and leaving the patient in discontinuity  PROCEDURE: Patient was  brought directly from the heart ICU and taken the OR 1 and placed on the operating table.  His endotracheal tube was connected to the anesthesia circuit.  Patient already had sequential compression devices placed.  Anesthesia did have some trouble oxygenating the patient.  His O2 sats were hovering in the mid to high 80s.  Therefore we decided we would just again do a damage control procedure since he also had high peak airway pressures and was on multiple vasopressors.  The external wound VAC dressing was removed.  His abdomen was prepped and draped in the usual standard surgical fashion with Betadine.  The sponge and visceral protective layer was removed.  Surgical timeout was performed.  The patient was on scheduled therapeutic antibiotics.  A Balfour retractor was placed.  We identified the area of where the proximal and distal small bowel had been tethered together with a silk suture.  This was taken down.  The terminal ileum had patchy necrosis which was full-thickness.  The anterior lateral aspect of the cecum also had patchy necrosis.  Upon looking at the ascending colon there was another segmental area of patchy necrosis anterior laterally.  Therefore I decided that we needed to do a right colectomy.  The lateral attachments of the right colon were taken down with a combination of blunt dissection, Bovie electrocautery and LigaSure device.  The attachments keeping down the appendix were taken down.  The ileocolonic pedicle was clamped with a Kelly and ligated distally with the Lige sure and a 2-0 silk suture was placed on the proximal aspect of the laconic pedicle and tied.  We continued taking down the lateral attachments of the ascending colon  with LigaSure device.  The hepatic flexure was taken down and mobilized with LigaSure device.  We identified the duodenum.  The right branch of the middle colic vessel was taken with a 2-0 silk suture.  We then transected the transverse colon with a fire of the GIA  75 stapler with a blue load.  This freed the specimen and it was passed off the field.  We then inspected for hemostasis.  There was good hemostasis.  There was 1 area around the duodenum that had had some oozing but it was hemostatic.  We irrigated the abdomen with 2 L of saline.  I did place a piece of surgical snow near that area of the duodenum.  There is no evidence of injury to the duodenum.  The mesenteric edges also appeared hemostatic.  Given the patient was on 3 vasopressors, O2 sats in the mid to high 80s and had elevated airway pressures the abdomen was not close.  An open abdominal ABThera wound VAC was obtained.  The visceral protective layer was placed by my assistant into the paracolic gutters ensuring adequate coverage over the viscera.  Blue sponges were applied and then placed the plastic drape followed by the track serve pad and it was connected to the wound VAC there was a good seal with no signs of leak.  All needle, instrument, and sponge counts were correct x 2.  The patient was taken directly back to the ICU.  PLAN OF CARE: already inpt  PATIENT DISPOSITION:  ICU - intubated and critically ill.   Delay start of Pharmacological VTE agent (>24hrs) due to surgical blood loss or risk of bleeding:  no  Camellia HERO. Tanda, MD, FACS General, Bariatric, & Minimally Invasive Surgery [facility]

## 2024-12-06 NOTE — Progress Notes (Addendum)
 PHARMACY - TOTAL PARENTERAL NUTRITION CONSULT NOTE   Indication: massive bowel resection   Patient Measurements: Height: 5' 10 (177.8 cm) Weight: 81.3 kg (179 lb 3.7 oz) IBW/kg (Calculated) : 73 TPN AdjBW (KG): 69.9 Body mass index is 25.72 kg/m. Usual Weight: weight 79kg on admission, unclear baseline.   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 Patient underwent an aortic and mitral valve replacement on 12/8 with insertion of Impella. Post-op was found to have continuing HgB drop with noted abdominal pain. Was found to have hemorrhage from intercostal, chest tube and embolization was completed. Workup for dropping HgB included CT angio of chest/ab/and pelvis showed pneumatosis and concern for bowel ischemia. Patient went to OR on 12/12 for ex-lap found to have ischemia of distal ileum over 75cm without perforation. Patient was left in discontinuity with plans to return to OR.  Patient with diet throughout admission however noted nausea and abdominal pain during peri-op time period. Pharmacy consulted to manage TPN.   Glucose / Insulin : no hx DM, A1c 6.1% - CBGs < 180 Used 6 units SSI in the past 24 hrs Electrolytes: Na/CL low 132/96, CoCa 9 post 4gm IV (iCa also WNL), Phos down to WNL (none in TPN), others WNL Renal: AKI resolved - SCr down < 1 (BL SCr ~ 0.7), BUN 25.  Previously on Lasix  80mg  IV BID, last dose on 12/11. Hepatic: AST/ALT mildly elevated, tbili 2.3 (no jaundice), alk phos / TG WNL, albumin  2.9 *elevation not due to TPN Intake / Output; MIVF: UOP 1.3 ml/kg/hr, NG 0mL, drain , chest tube down to , LBM 12/12, net -5.6L (improving) GI Imaging: 12/11 CT: pneumatosis of small bowel with gas, concerning for bowel ischemia  GI Surgeries / Procedures:  12/11 ex-lap with distal ileum with ischemia (75cm), left in discontinuity with plans to return to OR on  Sunday to evaluate for further ischemia   12/14 tentative OR for small bowel anastomosis, abd closure if possible  Central access: PICC placed 12/04/24 TPN start date: 12/04/24  Nutritional Goals:  Goal concentrated TPN rate without lipids is 36mL/hr (90g/L AA and 22% CHO provides 130g AA, 317g CHO, and 1596 kCal per day)  Goal concentrated TPN rate with lipids is 70 mL/hr (78g/L AA, 38g/L ILE, and 16.5% CHO will provide 130g AA, 274g CHO, 64g ILE and 2107 kcals per day)  RD Estimated Needs Total Energy Estimated Needs: 2100-2300 kcals Total Protein Estimated Needs: 125-150 g Total Fluid Estimated Needs: 1.8 L  Current Nutrition:  TPN Propofol  18.2 ml/hr = 480 kCal per day  Plan:  Concentrate TPN as fluid resuscitation is completed and anticipating diuresis again TPN without lipids at 60 ml/hr will provide 130g AA, 317g CHO and 1596 kCal.  kCal with Propofol  will be 2076 per day. TPN and Propofol  will meet >95% of needs Electrolytes in TPN: continue Na at 100 mEq/L (anticipate it rising once diuresing again), add K 28mEq/L, Ca 5mEq/L, Mg 2mEq/L, add Phos 15mmol/L, change to max CL Add standard MVI and trace elements to TPN Continue CVTS SSI Q4H Monitor TPN labs on Mon/Thurs - labs in AM F/u with transitioning off Propofol  as able - potential extubation post OR  Miguelina Fore D. Lendell, PharmD, BCPS, BCCCP 12/06/2024, 8:18 AM

## 2024-12-06 NOTE — Progress Notes (Signed)
° °  [address] [place] [address]             [phone]   S/p right colectomy  Intubated and sedated  BP (!) 86/73   Pulse 97   Temp 99.9 F (37.7 C)   Resp (!) 22   Ht 5' 10 (1.778 m)   Wt 81.3 kg   SpO2 100%   BMI 25.72 kg/m  56/23 CI 3  Milrinone  0.25, epi 8, off norepi and vasopressin    Intake/Output Summary (Last 24 hours) at 12/06/2024 1823 Last data filed at 12/06/2024 1800 Gross per 24 hour  Intake 4900.23 ml  Output 4235 ml  Net 665.23 ml   Hct 33 K 3.8  Alejandro Lopez C. Kerrin, MD [facility] [phone]

## 2024-12-06 NOTE — Progress Notes (Addendum)
 Pharmacy Antibiotic Note  Alejandro Lopez is a 43 y.o. 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 Also s/p exploratory laparotomy and small bowel resection. Patient started on Zosyn  for open-abdomen post-op. Patient still with ongoing low grade fevers. Pharmacy has been consulted for vancomycin  dosing.  12/06/24: Tmax 101.13F, WBC increased to 14.3. Patient still remains on pressor support. Plan to return to OR with general surgery today for small bowel anastomosis and possible abdominal closure.  Plan: Vancomycin  1750 mg IV x1, then vancomycin  1250 mg IV every 12 hours  - Scr 0.92, Vd 0.72, eAUC 467 Zosyn  3.375 gm IV every 8 hours Monitor culture results Monitor renal function and vancomycin  levels as indicated F/u clinical picture and antibiotic LOT  Height: 5' 10 (177.8 cm) Weight: 81.3 kg (179 lb 3.7 oz) IBW/kg (Calculated) : 73  Temp (24hrs), Avg:99.9 F (37.7 C), Min:98.8 F (37.1 C), Max:101.3 F (38.5 C)  Recent Labs  Lab 12/04/24 0435 12/04/24 0441 12/04/24 0749 12/04/24 1016 12/04/24 1648 12/04/24 2155 12/04/24 2217 12/05/24 0044 12/05/24 0212 12/05/24 1422 12/06/24 0352  WBC 5.4  --   --   --  5.2  --  7.4  --  8.3 11.4* 14.3*  CREATININE 1.28*  --   --   --  1.98*  --   --   --  1.58* 1.22 0.92  LATICACIDVEN  --  4.7* 3.1* 2.4*  --  2.2*  --  1.8  --   --   --     Estimated Creatinine Clearance: 104.7 mL/min (by C-G formula based on SCr of 0.92 mg/dL).    Allergies[1]  Antimicrobials this admission: Zosyn  12/12 >> Vancomycin  12/14 >>  Microbiology results: 12/11 Pleural fluid: ngtd 12/11 BCx: ngtd 12/7 MSSA PCR: positive (MRSA negative) 12/2 Bcx: ngtd x5 days  Thank you for allowing pharmacy to be a part of this patients care.  Morna Breach, PharmD, BCPS PGY2 Cardiology Pharmacy Resident 12/06/2024 8:02 AM     [1] No Known Allergies

## 2024-12-06 NOTE — Plan of Care (Signed)
°  Problem: Clinical Measurements: Goal: Diagnostic test results will improve Outcome: Progressing Goal: Respiratory complications will improve Outcome: Progressing Goal: Cardiovascular complication will be avoided Outcome: Progressing   Problem: Nutrition: Goal: Adequate nutrition will be maintained Outcome: Progressing   Problem: Coping: Goal: Level of anxiety will decrease Outcome: Progressing   Problem: Elimination: Goal: Will not experience complications related to urinary retention Outcome: Progressing   Problem: Cardiovascular: Goal: Ability to achieve and maintain adequate cardiovascular perfusion will improve Outcome: Progressing Goal: Vascular access site(s) Level 0-1 will be maintained Outcome: Progressing   Problem: Cardiac: Goal: Will achieve and/or maintain hemodynamic stability Outcome: Progressing   Problem: Clinical Measurements: Goal: Postoperative complications will be avoided or minimized Outcome: Progressing   Problem: Respiratory: Goal: Respiratory status will improve Outcome: Progressing   Problem: Fluid Volume: Goal: Ability to achieve a balanced intake and output will improve Outcome: Progressing

## 2024-12-06 NOTE — Progress Notes (Signed)
 3 Days Post-Op   Subjective/Chief Complaint: Still on 3 pressors Intubated/sedated   Objective: Vital signs in last 24 hours: Temp:  [98.8 F (37.1 C)-101.3 F (38.5 C)] 100.6 F (38.1 C) (12/14 0730) Pulse Rate:  [93-114] 104 (12/14 0730) Resp:  [0-24] 16 (12/14 0730) BP: (82-117)/(51-100) 108/78 (12/14 0730) SpO2:  [90 %-100 %] 95 % (12/14 0730) Arterial Line BP: (43-120)/(28-95) 98/69 (12/14 0715) FiO2 (%):  [40 %] 40 % (12/14 0400) Weight:  [81.3 kg] 81.3 kg (12/14 0500) Last BM Date : 12/05/24  Intake/Output from previous day: 12/13 0701 - 12/14 0700 In: 4193.8 [I.V.:3560.8; IV Piggyback:290.2] Out: 3320 [Urine:2335; Drains:625; Chest Tube:360] Intake/Output this shift: Total I/O In: -  Out: 235 [Urine:225; Chest Tube:10]  Intubated/sedated Soft, a little distended, open abd wound vac intact-serosang  Lab Results:  Recent Labs    12/05/24 1422 12/06/24 0352 12/06/24 0612  WBC 11.4* 14.3*  --   HGB 10.1* 9.7* 9.5*  HCT 27.9* 27.4* 28.0*  PLT 84* 72*  --    BMET Recent Labs    12/05/24 1422 12/06/24 0352 12/06/24 0612  NA 130* 132* 133*  K 4.3 4.1 4.0  CL 96* 96*  --   CO2 22 27  --   GLUCOSE 117* 131*  --   BUN 29* 25*  --   CREATININE 1.22 0.92  --   CALCIUM  8.5* 8.2*  --    PT/INR Recent Labs    12/05/24 0212 12/06/24 0352  LABPROT 16.2* 15.6*  INR 1.2 1.2   ABG Recent Labs    12/05/24 0621 12/06/24 0612  PHART 7.319* 7.309*  HCO3 23.7 24.7    Studies/Results: DG CHEST PORT 1 VIEW Result Date: 12/06/2024 EXAM: 1 VIEW(S) XRAY OF THE CHEST 12/06/2024 07:12:00 AM COMPARISON: Portable chest dated 12/05/2024 07:33:00 AM. CLINICAL HISTORY: [id number] Chest tube in place. FINDINGS: LINES, TUBES AND DEVICES: The left IJ Swan-Ganz line again terminates in the right pulmonary artery. There is stable positioning of ETT, enteric tube, right PICC, and right axillosubclavian Impella. LUNGS AND PLEURA: There is central vascular congestion and mild  interstitial edema. There is patchy airspace disease of the lung fields. Right pleural effusion. Unchanged pigtail right pleural tube. Overall aeration seems unchanged. No pneumothorax. HEART AND MEDIASTINUM: There are midline sternotomy sutures, AVR, and MVR. The heart is enlarged. No acute abnormality of the mediastinal silhouette. BONES AND SOFT TISSUES: No acute osseous abnormality. No new abnormality. IMPRESSION: 1. Stable positioning of ETT, enteric tube, right PICC, right axillosubclavian Impella, and left IJ Swan-Ganz catheter in the right pulmonary artery. 2. Cardiomegaly with central vascular congestion and mild interstitial edema. 3. Patchy airspace disease with right pleural effusion; unchanged right pleural pigtail catheter. Overall aeration unchanged. Electronically signed by: Francis Quam MD 12/06/2024 07:40 AM EST RP Workstation: [id number]   DG CHEST PORT 1 VIEW Result Date: 12/05/2024 CLINICAL DATA:  Follow-up right chest tube. EXAM: PORTABLE CHEST 1 VIEW COMPARISON:  12/04/2024 FINDINGS: Endotracheal tube in satisfactory position. Nasogastric tube extending into the stomach with its tip and side hole not included. Stable right upper extremity inserted Impella device and left jugular Swan-Ganz catheter with its tip in the region of the intersegmental pulmonary artery on the right. Stable prosthetic aortic and mitral valves. Right PICC tip in the region of the superior cavoatrial junction. Stable right pleural pigtail catheter and median sternotomy wires. Obscured heart borders with no gross change in size of the heart. Interval patchy density in the left mid lung zone and dense  opacity in the left lower lobe. Increased right mid and basilar atelectasis. Stable probable right pleural effusion. Unremarkable bones. IMPRESSION: 1. Interval patchy atelectasis or pneumonia in the left mid lung zone and dense atelectasis or pneumonia in the left lower lobe. 2. Increased right mid and basilar  atelectasis. 3. Stable probable right pleural effusion. 4. Stable tubes and lines. Electronically Signed   By: Elspeth Bathe M.D.   On: 12/05/2024 11:44   DG Chest Port 1 View Result Date: 12/04/2024 EXAM: 1 VIEW(S) XRAY OF THE CHEST 12/04/2024 05:05:28 PM COMPARISON: 12/04/2024 CLINICAL HISTORY: Status post peripherally inserted central catheter (PICC) central line placement. FINDINGS: LINES, TUBES AND DEVICES: Right internal jugular Swan-Ganz catheter in place with tip in expected region of right pulmonary artery. Endotracheal tube in place with tip 4.5 cm above carina. Enteric tube in place coursing into stomach. Right PICC in place with tip overlying expected region of superior cavoatrial junction. Right pigtail chest tube in place. Right sided Impella device. LUNGS AND PLEURA: Moderate right pleural effusion with underlying lower lung opacity, likely atelectasis. No pneumothorax. HEART AND MEDIASTINUM: Surgical changes overlying heart. Cardiac valve replacements noted. BONES AND SOFT TISSUES: No acute osseous abnormality. IMPRESSION: 1. New right PICC at the superior cavoatrial junction. 2. Additional stable support apparatus as above. 3. Moderate right pleural effusion with associated right basilar opacity favored to reflect atelectasis. Electronically signed by: Pinkie Pebbles MD 12/04/2024 06:11 PM EST RP Workstation: [id number]   ECHOCARDIOGRAM LIMITED Result Date: 12/04/2024    ECHOCARDIOGRAM LIMITED REPORT   Patient Name:   Alejandro Lopez Date of Exam: 12/04/2024 Medical Rec #:  [id number]        Height:       70.0 in Accession #:    [id number]       Weight:       167.3 lb Date of Birth:  [birth year]       BSA:          1.935 m Patient Age:    43 years         BP:           63/40 mmHg Patient Gender: M                HR:           94 bpm. Exam Location:  Inpatient Procedure: Limited Echo, Color Doppler and Cardiac Doppler (Both Spectral and            Color Flow Doppler were utilized during  procedure). Indications:    I50.9* Heart failure (unspecified)  History:        Patient has prior history of Echocardiogram examinations, most                 recent 12/02/2024. CHF, Signs/Symptoms:ETOH; Risk                 Factors:Hypertension.  Sonographer:    Damien Senior RDCS Referring Phys: [phone] EZRA RAMAN [facility]  Sonographer Comments: Impella check, Dr Rolan at bedside IMPRESSIONS  1. Left ventricular ejection fraction, by estimation, is 20 to 25%. The left ventricle has severely decreased function. There is moderate concentric left ventricular hypertrophy. Impella 5.5 tip at 5.2 cm from the aortic valve.  2. Right ventricular systolic function is severely reduced. The right ventricular size is normal.  3. Left atrial size was mildly dilated.  4. Bioprosthetic aortic valve was not interrogated.  5. Bioprosthetic mitral valve was not interrogated.  6. The inferior vena  cava is normal in size with greater than 50% respiratory variability, suggesting right atrial pressure of 3 mmHg.  7. A small pericardial effusion is present.  8. Limited echo for Impella position. FINDINGS  Left Ventricle: Left ventricular ejection fraction, by estimation, is 20 to 25%. The left ventricle has severely decreased function. The left ventricular internal cavity size was normal in size. There is moderate concentric left ventricular hypertrophy. Right Ventricle: The right ventricular size is normal. Right ventricular systolic function is severely reduced. Left Atrium: Left atrial size was mildly dilated. Right Atrium: Right atrial size was normal in size. Pericardium: A small pericardial effusion is present. Mitral Valve: Bioprosthetic mitral valve was not interrogated. Aortic Valve: Bioprosthetic aortic valve was not interrogated. Venous: The inferior vena cava is normal in size with greater than 50% respiratory variability, suggesting right atrial pressure of 3 mmHg. Additional Comments: Spectral Doppler performed. Color Doppler  performed.  Dalton [employer] Electronically signed by Ezra Kanner Signature Date/Time: 12/04/2024/3:21:55 PM    Final    US  EKG SITE RITE Result Date: 12/04/2024 If Site Rite image not attached, placement could not be confirmed due to current cardiac rhythm.   Anti-infectives: Anti-infectives (From admission, onward)    Start     Dose/Rate Route Frequency Ordered Stop   12/06/24 2200  vancomycin  (VANCOREADY) IVPB 1250 mg/250 mL        1,250 mg 166.7 mL/hr over 90 Minutes Intravenous Every 12 hours 12/06/24 0811     12/06/24 0900  vancomycin  (VANCOREADY) IVPB 1750 mg/350 mL        1,750 mg 175 mL/hr over 120 Minutes Intravenous  Once 12/06/24 0802     12/04/24 1100  vancomycin  (VANCOCIN ) IVPB 1000 mg/200 mL premix  Status:  Discontinued        1,000 mg 200 mL/hr over 60 Minutes Intravenous Every 12 hours 12/03/24 2238 12/03/24 2242   12/04/24 0000  vancomycin  (VANCOREADY) IVPB 1500 mg/300 mL  Status:  Discontinued        1,500 mg 150 mL/hr over 120 Minutes Intravenous  Once 12/03/24 2236 12/03/24 2242   12/03/24 2300  piperacillin -tazobactam (ZOSYN ) IVPB 3.375 g        3.375 g 12.5 mL/hr over 240 Minutes Intravenous Every 8 hours 12/03/24 2236     11/30/24 2130  vancomycin  (VANCOCIN ) IVPB 1000 mg/200 mL premix        1,000 mg 200 mL/hr over 60 Minutes Intravenous  Once 11/30/24 1609 11/30/24 2351   11/30/24 1615  ceFAZolin  (ANCEF ) IVPB 2g/100 mL premix        2 g 200 mL/hr over 30 Minutes Intravenous Every 8 hours 11/30/24 1609 12/02/24 0546   11/30/24 0400  vancomycin  (VANCOREADY) IVPB 1250 mg/250 mL        1,250 mg 166.7 mL/hr over 90 Minutes Intravenous To Surgery 11/29/24 1040 11/30/24 0856   11/30/24 0400  ceFAZolin  (ANCEF ) IVPB 2g/100 mL premix        2 g 200 mL/hr over 30 Minutes Intravenous To Surgery 11/29/24 1040 11/30/24 1239   11/30/24 0400  ceFAZolin  (ANCEF ) IVPB 2g/100 mL premix  Status:  Discontinued        2 g 200 mL/hr over 30 Minutes Intravenous To Surgery  11/29/24 1036 11/30/24 1609       Assessment/Plan: POD#2 - status post ex lap with SBR & VAC placement - Dr. Sebastian 12/04/2024 - discussed with CCM, Cardiology - VAC in place -  return to OR for reexploration possible bowel resection, possible small  bowel anastomosis, poss ostomy abdominal closure if possible (Dr. Tanda) -concern for potential ongoing ischemia given 3 vasopressors  I discussed the procedure in detail with wife.  We discussed the risks and benefits of surgery including, but not limited to bleeding, infection (such as wound infection, abdominal abscess), injury to surrounding structures, blood clot formation, urinary retention, incisional hernia, anastomotic stricture, anastomotic leak, dehiscence, potential need for ostomy with issues associated with that, anesthesia risks, pulmonary & cardiac complications such as pneumonia &/or heart attack, need for additional procedures, ileus, & prolonged hospitalization, death.  We discussed the typical postoperative recovery course, including limitations & restrictions postoperatively. I explained that the likelihood of improvement in their symptoms is fair.    Discussed increased risk for leak   Camellia HERO. Tanda, MD, FACS General, Bariatric, & Minimally Invasive Surgery [facility],  A [facility]    LOS: 13 days    Camellia Tanda 12/06/2024

## 2024-12-06 NOTE — Transfer of Care (Signed)
 Immediate Anesthesia Transfer of Care Note  Patient: Alejandro Lopez  Procedure(s) Performed: LAPAROTOMY, EXPLORATORY; WOUND VAC CHANGE (Abdomen) COLECTOMY, RIGHT (Right: Abdomen)  Patient Location: ICU  Anesthesia Type:General  Level of Consciousness: sedated, unresponsive, and Patient remains intubated per anesthesia plan  Airway & Oxygen  Therapy: Patient remains intubated per anesthesia plan and Patient placed on Ventilator (see vital sign flow sheet for setting)  Post-op Assessment: Report given to RN and Post -op Vital signs reviewed and stable  Post vital signs: Reviewed and stable  Last Vitals:  Vitals Value Taken Time  BP 125/87   Temp    Pulse 92 12/06/24 13:35  Resp 21 12/06/24 13:35  SpO2 93 % 12/06/24 13:35  Vitals shown include unfiled device data.  Last Pain:  Vitals:   12/06/24 0800  TempSrc: Bladder  PainSc:       Patients Stated Pain Goal: 0 (12/03/24 0400)  Complications: No notable events documented.

## 2024-12-06 NOTE — Progress Notes (Signed)
°   12/06/24 0730  Daily Weaning Assessment  Daily Assessment of Readiness to Wean Wean protocol criteria not met  SBT Method CPAP 5 cm H20 and PS 5 cm H20 (10/5)  Weaning Start Time 0730  Patient response Passed (Tolerated well)     Pt placed back on full support for upcoming OR trip.

## 2024-12-06 NOTE — Progress Notes (Signed)
.. ° ° °  PROCEDURAL EXPEDITER PROGRESS NOTE  Patient Name: Alejandro Lopez  DOB:06-Aug-1981 Date of Admission: 11/22/2024  Date of Assessment:12/06/2024   -------------------------------------------------------------------------------------------------------------------   Brief clinical summary: PT to OR today for exploratory lap with removal of VAC dressing with possible small bowel anastomosis and possible abdominal wall closure.   Orders in place:  Yes   Communication with surgical team if no orders: N/A  Labs, test, and orders reviewed: Y  Requires surgical clearance:  No  Barriers noted: N/A   Intervention provided by [facility] team: N/A  Barrier resolved: not applicable   -------------------------------------------------------------------------------------------------------------------  [employer], [place], [place] Please contact us  directly via secure chat (search for [facility]) or by calling us  at [phone] [facility]).

## 2024-12-06 NOTE — Progress Notes (Signed)
 NAME:  Alejandro Lopez, MRN:  [id number], DOB:  [birth year], LOS: 13 ADMISSION DATE:  11/22/2024, CONSULTATION DATE:  12/8 REFERRING MD:  Lucas, CHIEF COMPLAINT:  post cardiac surgery critical care services    History of Present Illness:  43 year old male patient with history of hypertension, alcohol and tobacco abuse, presented to the emergency room on 11/30 with approximately 1 month history of progressive dyspnea accompanied by lower extremity swelling extending up to the level of his scrotum and abdomen. Diagnostic evaluation by echocardiogram showed left ventricular ejection fraction 35 to 40% with grade 3 diastolic dysfunction this was further complicated by severe mitral valve regurgitation and aortic insufficiency because of this he was transferred to [facility] for further evaluation. He underwent cardiac catheterization on 12/4: Right heart hemodynamic parameters showed baseline right atrial pressure 5 mmHg, PA pressure 32/17, pulmonary capillary wedge pressure at 14 estimated Fick 4.9 L/min with cardiac index Fick calculated at 2.59 His PAPi was 3 Left heart cath was negative for coronary artery disease Went to OR 12/8 for MVR and AVR w/ impella insertion. PCCM asked to assist w/ post op care   OR course EBL: 1735 Received  Products: cryo 92ml, FFP 401 Also received DDAVP , 2200 crystalloid, 250ml albumin   Cell saver 1125 Pump time 4hrs Clamp time 2hrs 50 min  Events: multiple defibrillations intra-op  Intra-op ECHO EF estimated 40%  Pertinent  Medical History  Tobacco abuse, alcohol abuse, hypertension  Significant Hospital Events: Including procedures, antibiotic start and stop dates in addition to other pertinent events   11/30 admitted/. ECHO 35 to 40% with grade 3 diastolic dysfunction this was further complicated by severe mitral valve regurgitation and aortic insufficiency 12/4 left and right heart cath 12/8 AVR and MVR w/ bioprosthetic valves. Arrived on icu  w/ Impella 5.5 MCS at flow 4 lpm and P 7. Received 2 more PLTs, 2 FFP and 1 cryo in first 6 hrs post op for cont blood loss/oozing. Required NE, epi and milrinone . Good flow on IMPELLA but minimal pulsatility  at P7. Placed on Amio gtt for VT 12/9 received 1 unit PRBC over night for hgb down to 7.5, hgb 8 getting second unit of blood. Still on P7 3.5 lPM. Extubated. 12/11 1L thora, later hemorrhagic shock and hemothorax> IR embolization. Also found to have ischemic bowel and underwent ex-lap with partial resection of ileum.  Interim History / Subjective:  Fever overnight- Tmax 101.3. OR this morning.   Objective    Blood pressure 100/80, pulse (!) 105, temperature (!) 100.9 F (38.3 C), resp. rate 17, height 5' 10 (1.778 m), weight 81.3 kg, SpO2 90%. PAP: (43-62)/(21-33) 56/24 CVP:  [11 mmHg-24 mmHg] 13 mmHg CO:  [3.7 L/min-7.1 L/min] 6.5 L/min CI:  [2 L/min/m2-3.8 L/min/m2] 3.5 L/min/m2  Vent Mode: PRVC FiO2 (%):  [40 %] 40 % Set Rate:  [18 bmp] 18 bmp Vt Set:  [580 mL] 580 mL PEEP:  [5 cmH20] 5 cmH20 Plateau Pressure:  [19 cmH20-25 cmH20] 25 cmH20   Intake/Output Summary (Last 24 hours) at 12/06/2024 0700 Last data filed at 12/06/2024 0600 Gross per 24 hour  Intake 4012.4 ml  Output 3320 ml  Net 692.4 ml   Filed Weights   12/03/24 0500 12/05/24 0500 12/06/24 0500  Weight: 75.9 kg 80.8 kg 81.3 kg    Examination: General critically ill appearing man intubated, sedated HENT  [place]/AT, eyes anicteric Pulm breathing comfortably on MV, minimal light tan secretions Cardiac: impella P8 with 4.2L flow Abd  soft, NT. Wound vac with mild bloody output Ext +dependent edema, RUE PICC. Warm extremities.  Neuro RASS -5, pinpoint pupils, breathes over the vent  Chest tube output: 360cc I/O +700cc   7.32/50/75/25 Na+ 132 BUN 25 Cr 0.92 LDH 299 WBC 14.3 H/H 9.7/27.4 Platelets 72 CXR personally reviewed>  dependent effusion on R, chest tube in place. Mild pulmonary edema.    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  -con't milrinone  0.54mcg -impella + bicarb purge -holding AC for now -GDMT limited while in shock -lasix  possibly later today pending OR findings -eventually needs warfarin for 3 months; to be started after impella -con't swan  Fevers -add vanc, con't zosyn  -trach aspirate culture -can't pull any lines/ tubes at this point  R pleural effusion> hemothorax post effusion, now with retained blood and chest tube in place.  -con't chest tube to suction, flush to prevent clotting -anticipate he will need VATS to clean out at the time the impella is removed   Lactic acidosis, resolved  Ischemic bowel with pneumatosis; s/p partial ileum resection. Currently in discontinuity. -OR today -con't TPN & PICC; keeping lipids out of TPN until extubated -monitor wound vac output -con't zosyn   Post-op vent management -LTVV -VAP prevention protocol -PAD protocol for sedation -daily SAT & SBT once abdomen closed  Hyperglycemia; h/o pre-DM. A1c 6.1 -SSI PRN -goal BG 140-180 -OP follow up with PCP for prediabetes   H/o tobacco abuse H/o asthma vs COPD -Quitting smoking recommended; have counseled him on this previously -nebs while on vent -pulmonary hygiene   ETOH abuse -wants to quit -TPN   Acute blood loss anemia, thrombocytopenia, coagulopathy due to hemorrhagic shock, consumption with impella -transfuse for Hb <7 or hemodynamically significant bleeding -monitor coags  Constipation, > was likely related to brewing bowel issues -no bowel regimen now  Hyponatremia -reassess for diuresis later today  DVT:  holding chemical DVT prophylaxis Needs foley, lines, chest tube, impella  Will reassess after OR. Wife updated at bedside.   Critical care time:      This patient is critically ill with multiple organ system failure which requires frequent high complexity decision making, assessment, support, evaluation, and titration of therapies. This was completed through the application of advanced monitoring technologies and extensive interpretation of multiple databases. During this encounter critical care time was devoted to patient care services described in this note for 41 minutes.  Leita SHAUNNA Gaskins, DO 12/06/2024 7:00 AM [facility]  For contact information, see Amion. If no response to pager, please call PCCM consult pager. After hours, 7PM- 7AM, please call Elink.

## 2024-12-06 NOTE — Progress Notes (Signed)
 3 Days Post-Op Procedures (LRB): LAPAROTOMY, EXPLORATORY small bowel resection abthrea therapy (N/A) Subjective: Intubated and sedated  Objective: Vital signs in last 24 hours: Temp:  [98.8 F (37.1 C)-101.3 F (38.5 C)] 100.6 F (38.1 C) (12/14 0730) Pulse Rate:  [93-114] 104 (12/14 0730) Cardiac Rhythm: Junctional rhythm (12/13 2000) Resp:  [0-24] 16 (12/14 0730) BP: (82-117)/(51-100) 108/78 (12/14 0730) SpO2:  [90 %-100 %] 95 % (12/14 0730) Arterial Line BP: (43-120)/(28-95) 98/69 (12/14 0715) FiO2 (%):  [40 %] 40 % (12/14 0400) Weight:  [81.3 kg] 81.3 kg (12/14 0500)  Hemodynamic parameters for last 24 hours: PAP: (43-62)/(21-33) 57/25 CVP:  [11 mmHg-24 mmHg] 14 mmHg CO:  [3.7 L/min-7.1 L/min] 6.5 L/min CI:  [2 L/min/m2-3.8 L/min/m2] 3.5 L/min/m2  Intake/Output from previous day: 12/13 0701 - 12/14 0700 In: 4193.8 [I.V.:3560.8; IV Piggyback:290.2] Out: 3320 [Urine:2335; Drains:625; Chest Tube:360] Intake/Output this shift: Total I/O In: -  Out: 235 [Urine:225; Chest Tube:10]  General appearance: intubated, sedated Heart: tachy, regular Lungs: rhonchi bilaterally Abdomen: VAC in place Extremities: edema @= Wound: sternal intact  Lab Results: Recent Labs    12/05/24 1422 12/06/24 0352 12/06/24 0612  WBC 11.4* 14.3*  --   HGB 10.1* 9.7* 9.5*  HCT 27.9* 27.4* 28.0*  PLT 84* 72*  --    BMET:  Recent Labs    12/05/24 1422 12/06/24 0352 12/06/24 0612  NA 130* 132* 133*  K 4.3 4.1 4.0  CL 96* 96*  --   CO2 22 27  --   GLUCOSE 117* 131*  --   BUN 29* 25*  --   CREATININE 1.22 0.92  --   CALCIUM  8.5* 8.2*  --     PT/INR:  Recent Labs    12/06/24 0352  LABPROT 15.6*  INR 1.2   ABG    Component Value Date/Time   PHART 7.309 (L) 12/06/2024 0612   HCO3 24.7 12/06/2024 0612   TCO2 26 12/06/2024 0612   ACIDBASEDEF 2.0 12/06/2024 0612   O2SAT 92 12/06/2024 0612   CBG (last 3)  Recent Labs    12/05/24 2343 12/06/24 0352 12/06/24 0717  GLUCAP  141* 134* 141*    Assessment/Plan: S/P Procedures (LRB): LAPAROTOMY, EXPLORATORY small bowel resection abthrea therapy (N/A) POD # 6/3 NEURO- sedated CV- in sinus tachy  Impella p8, 4.1 L/min  53/25 CI 3.4 epi 8, milrinone  0.25, norepi 10, vasopressin  0.04 RESP- CXR shows improved aeration on right but still some residual  VDRF- vent per CCM RENAL- creatinine 0.92  Lytes OK  Will need extensive diuresis in the future ENDO- CBG mildly elevated GI for relook lap later today  On TNA ID- febrile to 101.3 on Zosyn , WBC up slightly to 14K  Vanco added this AM Anemia Hgb stable 9.5 Thrombocytopenia- PLT down slightly to 72K Remains critically ill   LOS: 13 days    Alejandro Lopez 12/06/2024

## 2024-12-07 ENCOUNTER — Encounter (HOSPITAL_COMMUNITY): Payer: Self-pay | Admitting: General Surgery

## 2024-12-07 ENCOUNTER — Ambulatory Visit (HOSPITAL_COMMUNITY)

## 2024-12-07 ENCOUNTER — Inpatient Hospital Stay (HOSPITAL_COMMUNITY)

## 2024-12-07 DIAGNOSIS — D696 Thrombocytopenia, unspecified: Secondary | ICD-10-CM

## 2024-12-07 DIAGNOSIS — J90 Pleural effusion, not elsewhere classified: Secondary | ICD-10-CM

## 2024-12-07 DIAGNOSIS — D689 Coagulation defect, unspecified: Secondary | ICD-10-CM

## 2024-12-07 DIAGNOSIS — F1010 Alcohol abuse, uncomplicated: Secondary | ICD-10-CM | POA: Diagnosis not present

## 2024-12-07 DIAGNOSIS — R578 Other shock: Secondary | ICD-10-CM | POA: Diagnosis not present

## 2024-12-07 DIAGNOSIS — Z95811 Presence of heart assist device: Secondary | ICD-10-CM

## 2024-12-07 DIAGNOSIS — J9691 Respiratory failure, unspecified with hypoxia: Secondary | ICD-10-CM | POA: Diagnosis not present

## 2024-12-07 DIAGNOSIS — I110 Hypertensive heart disease with heart failure: Secondary | ICD-10-CM | POA: Diagnosis not present

## 2024-12-07 DIAGNOSIS — I5082 Biventricular heart failure: Secondary | ICD-10-CM

## 2024-12-07 DIAGNOSIS — D62 Acute posthemorrhagic anemia: Secondary | ICD-10-CM | POA: Diagnosis not present

## 2024-12-07 DIAGNOSIS — Z87891 Personal history of nicotine dependence: Secondary | ICD-10-CM | POA: Diagnosis not present

## 2024-12-07 DIAGNOSIS — I5021 Acute systolic (congestive) heart failure: Secondary | ICD-10-CM | POA: Diagnosis not present

## 2024-12-07 DIAGNOSIS — R570 Cardiogenic shock: Secondary | ICD-10-CM | POA: Diagnosis not present

## 2024-12-07 LAB — POCT I-STAT 7, (LYTES, BLD GAS, ICA,H+H)
Acid-Base Excess: 1 mmol/L (ref 0.0–2.0)
Acid-Base Excess: 1 mmol/L (ref 0.0–2.0)
Bicarbonate: 26 mmol/L (ref 20.0–28.0)
Bicarbonate: 26.6 mmol/L (ref 20.0–28.0)
Calcium, Ion: 1.11 mmol/L — ABNORMAL LOW (ref 1.15–1.40)
Calcium, Ion: 1.13 mmol/L — ABNORMAL LOW (ref 1.15–1.40)
HCT: 29 % — ABNORMAL LOW (ref 39.0–52.0)
HCT: 32 % — ABNORMAL LOW (ref 39.0–52.0)
Hemoglobin: 9.9 g/dL — ABNORMAL LOW (ref 13.0–17.0)
Hemoglobin: 10.9 g/dL — ABNORMAL LOW (ref 13.0–17.0)
O2 Saturation: 99 %
O2 Saturation: 98 %
Patient temperature: 38.6
Patient temperature: 38.1
Potassium: 3.5 mmol/L (ref 3.5–5.1)
Potassium: 3.4 mmol/L — ABNORMAL LOW (ref 3.5–5.1)
Sodium: 139 mmol/L (ref 135–145)
Sodium: 141 mmol/L (ref 135–145)
TCO2: 27 mmol/L (ref 22–32)
TCO2: 28 mmol/L (ref 22–32)
pCO2 arterial: 43.9 mmHg (ref 32–48)
pCO2 arterial: 48.7 mmHg — ABNORMAL HIGH (ref 32–48)
pH, Arterial: 7.387 (ref 7.35–7.45)
pH, Arterial: 7.35 (ref 7.35–7.45)
pO2, Arterial: 149 mmHg — ABNORMAL HIGH (ref 83–108)
pO2, Arterial: 108 mmHg (ref 83–108)

## 2024-12-07 LAB — ECHOCARDIOGRAM LIMITED
Height: 70 in
Weight: 2924.18 [oz_av]

## 2024-12-07 LAB — BPAM RBC
Blood Product Expiration Date: 202601112359
Blood Product Expiration Date: 202601112359
Blood Product Expiration Date: 202512312359
Blood Product Expiration Date: 202512312359
Blood Product Expiration Date: 202601062359
Blood Product Expiration Date: 202601062359
Blood Product Expiration Date: 202601102359
Blood Product Expiration Date: 202601102359
Blood Product Expiration Date: 202601102359
Blood Product Expiration Date: 202601102359
Blood Product Expiration Date: 202601112359
Blood Product Expiration Date: 202601112359
Blood Product Expiration Date: 202601112359
ISSUE DATE / TIME: 202512141218
ISSUE DATE / TIME: 202512141218
ISSUE DATE / TIME: 202512111902
ISSUE DATE / TIME: 202512111924
ISSUE DATE / TIME: 202512111924
ISSUE DATE / TIME: 202512112255
ISSUE DATE / TIME: 202512112255
ISSUE DATE / TIME: 202512112255
ISSUE DATE / TIME: 202512120045
ISSUE DATE / TIME: 202512120045
ISSUE DATE / TIME: 202512121700
ISSUE DATE / TIME: 202512121756
ISSUE DATE / TIME: 202512121756
Unit Type and Rh: 5100
Unit Type and Rh: 5100
Unit Type and Rh: 5100
Unit Type and Rh: 5100
Unit Type and Rh: 5100
Unit Type and Rh: 5100
Unit Type and Rh: 5100
Unit Type and Rh: 5100
Unit Type and Rh: 5100
Unit Type and Rh: 5100
Unit Type and Rh: 5100
Unit Type and Rh: 5100
Unit Type and Rh: 5100

## 2024-12-07 LAB — CBC
HCT: 29.5 % — ABNORMAL LOW (ref 39.0–52.0)
Hemoglobin: 10.3 g/dL — ABNORMAL LOW (ref 13.0–17.0)
MCH: 30.1 pg (ref 26.0–34.0)
MCHC: 34.9 g/dL (ref 30.0–36.0)
MCV: 86.3 fL (ref 80.0–100.0)
Platelets: 56 K/uL — ABNORMAL LOW (ref 150–400)
RBC: 3.42 MIL/uL — ABNORMAL LOW (ref 4.22–5.81)
RDW: 17 % — ABNORMAL HIGH (ref 11.5–15.5)
WBC: 14.1 K/uL — ABNORMAL HIGH (ref 4.0–10.5)
nRBC: 5 % — ABNORMAL HIGH (ref 0.0–0.2)

## 2024-12-07 LAB — MAGNESIUM
Magnesium: 1.9 mg/dL (ref 1.7–2.4)
Magnesium: 1.7 mg/dL (ref 1.7–2.4)

## 2024-12-07 LAB — TYPE AND SCREEN
ABO/RH(D): O POS
Antibody Screen: NEGATIVE
Unit division: 0
Unit division: 0
Unit division: 0
Unit division: 0
Unit division: 0
Unit division: 0
Unit division: 0
Unit division: 0
Unit division: 0
Unit division: 0
Unit division: 0
Unit division: 0
Unit division: 0

## 2024-12-07 LAB — COMPREHENSIVE METABOLIC PANEL WITH GFR
ALT: 34 U/L (ref 0–44)
AST: 89 U/L — ABNORMAL HIGH (ref 15–41)
Albumin: 1.7 g/dL — ABNORMAL LOW (ref 3.5–5.0)
Alkaline Phosphatase: 96 U/L (ref 38–126)
Anion gap: 8 (ref 5–15)
BUN: 24 mg/dL — ABNORMAL HIGH (ref 6–20)
CO2: 27 mmol/L (ref 22–32)
Calcium: 7.7 mg/dL — ABNORMAL LOW (ref 8.9–10.3)
Chloride: 102 mmol/L (ref 98–111)
Creatinine, Ser: 0.8 mg/dL (ref 0.61–1.24)
GFR, Estimated: >60 mL/min (ref >60)
Glucose, Bld: 135 mg/dL — ABNORMAL HIGH (ref 70–99)
Potassium: 3.5 mmol/L (ref 3.5–5.1)
Sodium: 137 mmol/L (ref 135–145)
Total Bilirubin: 3.9 mg/dL — ABNORMAL HIGH (ref 0.0–1.2)
Total Protein: 4.5 g/dL — ABNORMAL LOW (ref 6.5–8.1)

## 2024-12-07 LAB — GLUCOSE, CAPILLARY
Glucose-Capillary: 110 mg/dL — ABNORMAL HIGH (ref 70–99)
Glucose-Capillary: 112 mg/dL — ABNORMAL HIGH (ref 70–99)
Glucose-Capillary: 126 mg/dL — ABNORMAL HIGH (ref 70–99)
Glucose-Capillary: 131 mg/dL — ABNORMAL HIGH (ref 70–99)
Glucose-Capillary: 136 mg/dL — ABNORMAL HIGH (ref 70–99)
Glucose-Capillary: 97 mg/dL (ref 70–99)

## 2024-12-07 LAB — BASIC METABOLIC PANEL WITH GFR
Anion gap: 8 (ref 5–15)
BUN: 24 mg/dL — ABNORMAL HIGH (ref 6–20)
CO2: 27 mmol/L (ref 22–32)
Calcium: 7.8 mg/dL — ABNORMAL LOW (ref 8.9–10.3)
Chloride: 105 mmol/L (ref 98–111)
Creatinine, Ser: 0.75 mg/dL (ref 0.61–1.24)
GFR, Estimated: >60 mL/min (ref >60)
Glucose, Bld: 117 mg/dL — ABNORMAL HIGH (ref 70–99)
Potassium: 3.3 mmol/L — ABNORMAL LOW (ref 3.5–5.1)
Sodium: 140 mmol/L (ref 135–145)

## 2024-12-07 LAB — BODY FLUID CULTURE W GRAM STAIN: Culture: NO GROWTH

## 2024-12-07 LAB — PHOSPHORUS: Phosphorus: 2.4 mg/dL — ABNORMAL LOW (ref 2.5–4.6)

## 2024-12-07 LAB — LACTATE DEHYDROGENASE: LDH: 329 U/L — ABNORMAL HIGH (ref 105–235)

## 2024-12-07 LAB — COOXEMETRY PANEL
Carboxyhemoglobin: 1.9 % — ABNORMAL HIGH (ref 0.5–1.5)
Methemoglobin: <0.7 % (ref 0.0–1.5)
O2 Saturation: 74.1 %
Total hemoglobin: 10.6 g/dL — ABNORMAL LOW (ref 12.0–16.0)

## 2024-12-07 LAB — SURGICAL PATHOLOGY

## 2024-12-07 LAB — TRIGLYCERIDES: Triglycerides: 101 mg/dL

## 2024-12-07 MED ORDER — ACETAMINOPHEN 10 MG/ML IV SOLN
650.0000 mg | Freq: Four times a day (QID) | INTRAVENOUS | Status: DC
  Administered 2024-12-07: 09:00:00 1000 mg via INTRAVENOUS
  Filled 2024-12-07: qty 100

## 2024-12-07 MED ORDER — POTASSIUM PHOSPHATES 15 MMOLE/5ML IV SOLN
15.0000 mmol | Freq: Once | INTRAVENOUS | Status: AC
  Administered 2024-12-07: 10:00:00 15 mmol via INTRAVENOUS
  Filled 2024-12-07: qty 5

## 2024-12-07 MED ORDER — FUROSEMIDE 10 MG/ML IJ SOLN: 60.0000 mg | Freq: Once | INTRAMUSCULAR | Status: DC

## 2024-12-07 MED ORDER — ACETAMINOPHEN 10 MG/ML IV SOLN
650.0000 mg | Freq: Four times a day (QID) | INTRAVENOUS | Status: AC
  Administered 2024-12-07: 15:00:00 1000 mg via INTRAVENOUS
  Administered 2024-12-07 – 2024-12-08 (×2): 650 mg via INTRAVENOUS
  Filled 2024-12-07 (×3): qty 100

## 2024-12-07 MED ORDER — FUROSEMIDE 10 MG/ML IJ SOLN
60.0000 mg | Freq: Two times a day (BID) | INTRAMUSCULAR | Status: AC
  Administered 2024-12-07 (×2): 60 mg via INTRAVENOUS
  Filled 2024-12-07 (×2): qty 6

## 2024-12-07 MED ORDER — TRACE MINERALS CU-MN-SE-ZN 300-55-60-3000 MCG/ML IV SOLN
INTRAVENOUS | Status: AC
  Filled 2024-12-07: qty 864

## 2024-12-07 MED ORDER — MAGNESIUM SULFATE 2 GM/50ML IV SOLN
2.0000 g | Freq: Once | INTRAVENOUS | Status: AC
  Administered 2024-12-07: 09:00:00 2 g via INTRAVENOUS
  Filled 2024-12-07: qty 50

## 2024-12-07 MED ORDER — POTASSIUM CHLORIDE 10 MEQ/50ML IV SOLN
10.0000 meq | INTRAVENOUS | Status: AC
  Administered 2024-12-07 (×2): 10 meq via INTRAVENOUS
  Filled 2024-12-07 (×2): qty 50

## 2024-12-07 MED ORDER — POTASSIUM CHLORIDE 10 MEQ/50ML IV SOLN
10.0000 meq | INTRAVENOUS | Status: AC
  Administered 2024-12-07 (×4): 10 meq via INTRAVENOUS
  Filled 2024-12-07 (×4): qty 50

## 2024-12-07 MED ORDER — SODIUM CHLORIDE 0.9 % IV SOLN: INTRAVENOUS | Status: DC | PRN

## 2024-12-07 MED ADMIN — Epinephrine PF Inj 1 MG/ML: 8 ug/min | INTRAVENOUS | @ 06:00:00 | NDC 70004062840

## 2024-12-07 NOTE — Progress Notes (Signed)
 1 Day Post-Op Procedures (LRB): LAPAROTOMY, EXPLORATORY; WOUND VAC CHANGE (N/A) COLECTOMY, RIGHT (Right) Subjective: S/p right hemicolectomy yesterday, still with open abdomen Epi 8, milrinone  0.25 - Index 3-4, CVP < 10, off levo/vaso Neuro intact Chest tube output minimal Continues to have fever  Objective: Vital signs in last 24 hours: Temp:  [99 F (37.2 C)-101.7 F (38.7 C)] 101.7 F (38.7 C) (12/15 0600) Pulse Rate:  [85-105] 105 (12/15 0803) Cardiac Rhythm: Junctional rhythm (12/14 2000) Resp:  [18-25] 22 (12/15 0600) BP: (82-133)/(61-102) 104/72 (12/15 0803) SpO2:  [90 %-100 %] 100 % (12/15 0600) Arterial Line BP: (88-135)/(51-90) 108/62 (12/15 0600) FiO2 (%):  [40 %-60 %] 40 % (12/15 0803) Weight:  [82.9 kg] 82.9 kg (12/15 0500)  Hemodynamic parameters for last 24 hours: PAP: (49-64)/(15-30) 55/20 CVP:  [4 mmHg-16 mmHg] 5 mmHg CO:  [5.1 L/min-7.3 L/min] 6.5 L/min CI:  [2.7 L/min/m2-3.9 L/min/m2] 3.5 L/min/m2  Intake/Output from previous day: 12/14 0701 - 12/15 0700 In: 4682.3 [I.V.:3005.1; Blood:630; IV Piggyback:738] Out: 5170 [Urine:4225; Drains:675; Blood:100; Chest Tube:170] Intake/Output this shift: No intake/output data recorded.  General appearance: intubated, sedated Heart: tachy, regular Lungs: rhonchi bilaterally Abdomen: VAC in place Extremities: edema @= Wound: sternal intact  Lab Results: Recent Labs    12/06/24 0352 12/06/24 0612 12/07/24 0309 12/07/24 0456  WBC 14.3*  --  14.1*  --   HGB 9.7*   < > 10.3* 9.9*  HCT 27.4*   < > 29.5* 29.0*  PLT 72*  --  56*  --    < > = values in this interval not displayed.   BMET:  Recent Labs    12/06/24 0352 12/06/24 0612 12/07/24 0309 12/07/24 0456  NA 132*   < > 137 139  K 4.1   < > 3.5 3.5  CL 96*  --  102  --   CO2 27  --  27  --   GLUCOSE 131*  --  135*  --   BUN 25*  --  24*  --   CREATININE 0.92  --  0.80  --   CALCIUM  8.2*  --  7.7*  --    < > = values in this interval not  displayed.    PT/INR:  Recent Labs    12/06/24 0352  LABPROT 15.6*  INR 1.2   ABG    Component Value Date/Time   PHART 7.387 12/07/2024 0456   HCO3 26.0 12/07/2024 0456   TCO2 27 12/07/2024 0456   ACIDBASEDEF 1.0 12/06/2024 1432   O2SAT 74.1 12/07/2024 0531   CBG (last 3)  Recent Labs    12/06/24 2348 12/07/24 0308 12/07/24 0716  GLUCAP 143* 136* 131*    Assessment/Plan: S/P Procedures (LRB): LAPAROTOMY, EXPLORATORY; WOUND VAC CHANGE (N/A) COLECTOMY, RIGHT (Right) POD # 7/1 NEURO- sedated CV- in sinus tachy  Impella p8, 4.1 L/min  Epi 8, milrinone  0.25 -> discussed resuming vaso/levo for BP if needed and wean down on epi given CI 4 and CVP < 10 RESP- CXR shows improved aeration on right but still some residual  VDRF- vent per CCM RENAL- creatinine 0.80 - continues to make good urine without diuretics  Lytes OK  Will need extensive diuresis in the future ENDO- CBG mildly elevated GI - Open abdomen, s/p small bowel and right colon resection  On TPN ID- febrile, continue vanc/zosyn  Anemia Hgb stable  Thrombocytopenia- PLT down to 56 - hold off on DVT ppx for now Remains critically ill   LOS: 14 days  Con RAMAN Meiling Hendriks 12/07/2024

## 2024-12-07 NOTE — Progress Notes (Signed)
 NAME:  Alejandro Lopez, MRN:  [id number], DOB:  Apr 24, 1981, LOS: 14 ADMISSION DATE:  11/22/2024, CONSULTATION DATE:  12/8 REFERRING MD:  Lucas, CHIEF COMPLAINT:  post cardiac surgery critical care services    History of Present Illness:  43 year old male patient with history of hypertension, alcohol and tobacco abuse, presented to the emergency room on 11/30 with approximately 1 month history of progressive dyspnea accompanied by lower extremity swelling extending up to the level of his scrotum and abdomen. Diagnostic evaluation by echocardiogram showed left ventricular ejection fraction 35 to 40% with grade 3 diastolic dysfunction this was further complicated by severe mitral valve regurgitation and aortic insufficiency because of this he was transferred to [facility] for further evaluation. He underwent cardiac catheterization on 12/4: Right heart hemodynamic parameters showed baseline right atrial pressure 5 mmHg, PA pressure 32/17, pulmonary capillary wedge pressure at 14 estimated Fick 4.9 L/min with cardiac index Fick calculated at 2.59 His PAPi was 3 Left heart cath was negative for coronary artery disease Went to OR 12/8 for MVR and AVR w/ impella insertion. PCCM asked to assist w/ post op care   OR course EBL: 1735 Received  Products: cryo 92ml, FFP 401 Also received DDAVP , 2200 crystalloid, 250ml albumin   Cell saver 1125 Pump time 4hrs Clamp time 2hrs 50 min  Events: multiple defibrillations intra-op  Intra-op ECHO EF estimated 40%  Pertinent  Medical History  Tobacco abuse, alcohol abuse, hypertension  Significant Hospital Events: Including procedures, antibiotic start and stop dates in addition to other pertinent events   11/30 admitted/. ECHO 35 to 40% with grade 3 diastolic dysfunction this was further complicated by severe mitral valve regurgitation and aortic insufficiency 12/4 left and right heart cath 12/8 AVR and MVR w/ bioprosthetic valves. Arrived on icu  w/ Impella 5.5 MCS at flow 4 lpm and P 7. Received 2 more PLTs, 2 FFP and 1 cryo in first 6 hrs post op for cont blood loss/oozing. Required NE, epi and milrinone . Good flow on IMPELLA but minimal pulsatility  at P7. Placed on Amio gtt for VT 12/9 received 1 unit PRBC over night for hgb down to 7.5, hgb 8 getting second unit of blood. Still on P7 3.5 lPM. Extubated. 12/11 1L thora, later hemorrhagic shock and hemothorax> IR embolization. Also found to have ischemic bowel and underwent ex-lap with partial resection of ileum. 12/15 plan for ostomy tomorrow, transition epi to levophed    Interim History / Subjective:  No acute overnight events Impella at P-8 with flows ~4L  [place] 74%, CVP 12, CO 6L, CI 3.9, PAPI ~2.8 Platelets trending down but minimal output from chest tube Significant urine output yesterday, 4 L  Objective    Blood pressure 113/82, pulse (!) 106, temperature (!) 100.9 F (38.3 C), resp. rate (!) 24, height 5' 10 (1.778 m), weight 82.9 kg, SpO2 99%. PAP: (49-65)/(15-31) 65/31 CVP:  [4 mmHg-16 mmHg] 12 mmHg CO:  [5.1 L/min-7.7 L/min] 6.7 L/min CI:  [2.7 L/min/m2-4.1 L/min/m2] 3.6 L/min/m2  Vent Mode: PRVC FiO2 (%):  [40 %-60 %] 40 % Set Rate:  [18 bmp-22 bmp] 22 bmp Vt Set:  [580 mL] 580 mL PEEP:  [5 cmH20] 5 cmH20 Plateau Pressure:  [0 cmH20-24 cmH20] 21 cmH20   Intake/Output Summary (Last 24 hours) at 12/07/2024 0842 Last data filed at 12/07/2024 0800 Gross per 24 hour  Intake 4861.91 ml  Output 5350 ml  Net -488.09 ml   Filed Weights   12/05/24 0500 12/06/24 0500 12/07/24 0500  Weight: 80.8 kg 81.3 kg 82.9 kg    General: Critically ill male, intubated and sedated HEENT: MM pink/moist, sclera anicteric  Neuro: sedated on propofol  and fentanyl , following commands with sedation wean per RN CV: s1s2 tachycardoc, no m/r/g, Impella on P8 PULM:  mechanically ventilated, synchronous with vent, diminished in the bilateral bases without significant rhonchi or  wheezing GI: soft, discontinuity at the midline, wound VAC with minimal output Extremities: warm/dry, 1+ edema     Chest tube output: 10cc 4 L urine output today, net -487  CXR with stable pleural effusion   Resolved problem list   AKI Lactic acidosis, resolved Constipation Hyponatremia  Assessment and Plan   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 VT; frequent ectopy -con't amiodarone  infusion - Discussion with acute heart failure TCTS-stop epi and transition to Nor epi in the setting of likely septic/distributive shock MAP >65 -con't milrinone  0.54mcg -impella + bicarb purge - Continue holding AC for now -GDMT limited while in shock -Lasix  60 mg twice daily for today -eventually needs warfarin for 3 months; to be started after impella -con't swan  Fevers -Continue Vanco and Zosyn  -trach aspirate culture -can't pull any lines/ tubes at this point  R pleural effusion> hemothorax post effusion, now with retained blood and chest tube in place.  -con't chest tube to suction, flush to prevent clotting -No significant bleeding, will likely need VATS to clean out at the time the impella is removed     Ischemic bowel with pneumatosis; s/p partial ileum resection.  Currently in discontinuity. Last went to the OR 12/14 for reexploration of the abdomen, found necrotic portions of the distal ileum and cecum along with the anterior lateral ascending colon. Underwent R colectomy without anastomosis -OR for ostomy likely 12/16 will request on the right in the event patient is an LVAD candidate -con't TPN & PICC; keeping lipids out of TPN until extubated -monitor wound vac output - New broad-spectrum antibiotics  Post-op vent management -Maintain full vent support with SAT/SBT as tolerated -titrate Vent setting to maintain SpO2  greater than or equal to 90%. -HOB elevated 30 degrees. -Plateau pressures less than 30 cm H20.  -Follow chest x-ray, ABG prn.   -Bronchial hygiene and RT/bronchodilator protocol.  Hyperglycemia; h/o pre-DM. A1c 6.1 -SSI PRN -goal BG 140-180 -OP follow up with PCP for prediabetes   H/o tobacco abuse H/o asthma vs COPD -Quitting smoking recommended; have counseled him on this previously -nebs while on vent -pulmonary hygiene   ETOH abuse -wants to quit, address when appropriate  -TPN   Acute blood loss anemia, thrombocytopenia, coagulopathy due to hemorrhagic shock, consumption with impella -transfuse for Hb <7 or hemodynamically significant bleeding -monitor coags -per TCTS high threshold for platelet transfusion unless bleeding     DVT: holding chemical DVT prophylaxis Needs foley, lines, chest tube, impella     Critical care time:  40 minutes      CRITICAL CARE Performed by: Leita SAUNDERS Declan Mier   Total critical care time: 40 minutes  Critical care time was exclusive of separately billable procedures and treating other patients.  Critical care was necessary to treat or prevent imminent or life-threatening deterioration.  Critical care was time spent personally by me on the following activities: development of treatment plan with patient and/or surrogate as well as nursing, discussions with consultants, evaluation of patient's response to treatment, examination  of patient, obtaining history from patient or surrogate, ordering and performing treatments and interventions, ordering and review of laboratory studies, ordering and review of radiographic studies, pulse oximetry and re-evaluation of patient's condition.   Leita SAUNDERS Kenzlee Fishburn, PA-C [facility] See Amion for pager If no response to pager , please call [phone] until 7pm After 7:00 pm call Elink  [phone]

## 2024-12-07 NOTE — Plan of Care (Signed)
°  Problem: Clinical Measurements: Goal: Ability to maintain clinical measurements within normal limits will improve Outcome: Progressing Goal: Respiratory complications will improve Outcome: Progressing Goal: Cardiovascular complication will be avoided Outcome: Progressing   Problem: Cardiovascular: Goal: Ability to achieve and maintain adequate cardiovascular perfusion will improve Outcome: Progressing Goal: Vascular access site(s) Level 0-1 will be maintained Outcome: Progressing

## 2024-12-07 NOTE — Progress Notes (Signed)
 1 Day Post-Op   Subjective/Chief Complaint: Intubated, sedated   Objective: Vital signs in last 24 hours: Temp:  [99 F (37.2 C)-101.7 F (38.7 C)] 100.6 F (38.1 C) (12/15 0945) Pulse Rate:  [85-107] 102 (12/15 0945) Resp:  [19-26] 22 (12/15 0945) BP: (82-133)/(61-102) 95/71 (12/15 0945) SpO2:  [90 %-100 %] 100 % (12/15 0945) Arterial Line BP: (88-137)/(51-90) 106/63 (12/15 0945) FiO2 (%):  [40 %-60 %] 40 % (12/15 0803) Weight:  [82.9 kg] 82.9 kg (12/15 0500) Last BM Date : 12/05/24  Intake/Output from previous day: 12/14 0701 - 12/15 0700 In: 4682.3 [I.V.:3005.1; Blood:630; IV Piggyback:738] Out: 5170 [Urine:4225; Drains:675; Blood:100; Chest Tube:170] Intake/Output this shift: Total I/O In: 478.9 [I.V.:391.4; Other:27; IV Piggyback:50.4] Out: 545 [Urine:455; Chest Tube:90]  Ab vac in place with expected output  Lab Results:  Recent Labs    12/06/24 0352 12/06/24 0612 12/07/24 0309 12/07/24 0456  WBC 14.3*  --  14.1*  --   HGB 9.7*   < > 10.3* 9.9*  HCT 27.4*   < > 29.5* 29.0*  PLT 72*  --  56*  --    < > = values in this interval not displayed.   BMET Recent Labs    12/06/24 0352 12/06/24 0612 12/07/24 0309 12/07/24 0456  NA 132*   < > 137 139  K 4.1   < > 3.5 3.5  CL 96*  --  102  --   CO2 27  --  27  --   GLUCOSE 131*  --  135*  --   BUN 25*  --  24*  --   CREATININE 0.92  --  0.80  --   CALCIUM  8.2*  --  7.7*  --    < > = values in this interval not displayed.   PT/INR Recent Labs    12/05/24 0212 12/06/24 0352  LABPROT 16.2* 15.6*  INR 1.2 1.2   ABG Recent Labs    12/06/24 1432 12/07/24 0456  PHART 7.329* 7.387  HCO3 25.5 26.0    Studies/Results: ECHOCARDIOGRAM LIMITED Result Date: 12/07/2024    ECHOCARDIOGRAM LIMITED REPORT   Patient Name:   Alejandro Lopez Date of Exam: 12/07/2024 Medical Rec #:  [id number]        Height:       70.0 in Accession #:    [id number]       Weight:       182.8 lb Date of Birth:  Jan 13, 1981        BSA:          2.009 m Patient Age:    43 years         BP:           100/78 mmHg Patient Gender: M                HR:           105 bpm. Exam Location:  Inpatient Procedure: Limited Echo (Both Spectral and Color Flow Doppler were utilized            during procedure). Indications:    Impella check  History:        Patient has prior history of Echocardiogram examinations, most                 recent 12/04/2024.  Sonographer:    Tinnie Gosling RDCS Referring Phys: [phone] DALTON S MCLEAN IMPRESSIONS  1. Impella 5.5 tip at 4.6 cm from AV.  2. Lead in RV.  3. Right atrial size was Lead in RA.  4. Moderate pericardium effusion (max 1.3 cm) with fibrinous strand. Moderate pericardial effusion. Comparison(s): Changes from prior study are noted. 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 Compared to prior echo, Impella tip is less deep into LV and the pericardial effusion is more significant. FINDINGS  Left Ventricle: Impella 5.5 tip at 4.6 cm from AV. Right Ventricle: Lead in RV. Right Atrium: Right atrial size was Lead in RA. Pericardium: Moderate pericardium effusion (max 1.3 cm) with fibrinous strand. A moderately sized pericardial effusion is present. Joelle Cedars Tonleu Electronically signed by Joelle Cedars Ny Signature Date/Time: 12/07/2024/9:57:48 AM    Final    DG Chest Port 1 View Result Date: 12/07/2024 EXAM: 1 VIEW(S) XRAY OF THE CHEST 12/07/2024 05:17:00 AM COMPARISON: Portable chest from yesterday at 6:42 am. CLINICAL HISTORY: S/P AVR. FINDINGS: LINES, TUBES AND DEVICES: NGT is coiled in the stomach. Tip of the ETT is 4 cm from the carina. There is stable positioning of the left IJ Swan-Ganz line, right PICC, and a right subclavian Impella device. LUNGS AND PLEURA: Perihilar vascular congestion and interstitial edema are improved. No significant changes seen in bilateral airspace disease. Moderate sized right pleural effusion with pigtail chest catheter in place. No measurable pneumothorax. HEART AND MEDIASTINUM:  Cardiomegaly is stable with sternotomy sutures in place, mitral and aortic valve replacements. BONES AND SOFT TISSUES: No acute osseous abnormality. IMPRESSION: 1. Improved perihilar vascular congestion and interstitial edema. 2. Stable moderate-sized right pleural effusion with pigtail chest catheter in place. No measurable pneumothorax. 3. Stable cardiomegaly with sternotomy sutures in place and mitral and aortic valve replacements. 4. No improvement in bilateral airspace disease. Electronically signed by: Francis Quam MD 12/07/2024 06:46 AM EST RP Workstation: [id number]   DG CHEST PORT 1 VIEW Result Date: 12/06/2024 EXAM: 1 VIEW(S) XRAY OF THE CHEST 12/06/2024 07:12:00 AM COMPARISON: Portable chest dated 12/05/2024 07:33:00 AM. CLINICAL HISTORY: [id number] Chest tube in place. FINDINGS: LINES, TUBES AND DEVICES: The left IJ Swan-Ganz line again terminates in the right pulmonary artery. There is stable positioning of ETT, enteric tube, right PICC, and right axillosubclavian Impella. LUNGS AND PLEURA: There is central vascular congestion and mild interstitial edema. There is patchy airspace disease of the lung fields. Right pleural effusion. Unchanged pigtail right pleural tube. Overall aeration seems unchanged. No pneumothorax. HEART AND MEDIASTINUM: There are midline sternotomy sutures, AVR, and MVR. The heart is enlarged. No acute abnormality of the mediastinal silhouette. BONES AND SOFT TISSUES: No acute osseous abnormality. No new abnormality. IMPRESSION: 1. Stable positioning of ETT, enteric tube, right PICC, right axillosubclavian Impella, and left IJ Swan-Ganz catheter in the right pulmonary artery. 2. Cardiomegaly with central vascular congestion and mild interstitial edema. 3. Patchy airspace disease with right pleural effusion; unchanged right pleural pigtail catheter. Overall aeration unchanged. Electronically signed by: Francis Quam MD 12/06/2024 07:40 AM EST RP Workstation: [id number]     Anti-infectives: Anti-infectives (From admission, onward)    Start     Dose/Rate Route Frequency Ordered Stop   12/06/24 2200  vancomycin  (VANCOREADY) IVPB 1250 mg/250 mL        1,250 mg 166.7 mL/hr over 90 Minutes Intravenous Every 12 hours 12/06/24 0811     12/06/24 0900  vancomycin  (VANCOREADY) IVPB 1750 mg/350 mL        1,750 mg 175 mL/hr over 120 Minutes Intravenous  Once 12/06/24 0802 12/06/24 1032   12/04/24 1100  vancomycin  (VANCOCIN ) IVPB 1000 mg/200 mL premix  Status:  Discontinued  1,000 mg 200 mL/hr over 60 Minutes Intravenous Every 12 hours 12/03/24 2238 12/03/24 2242   12/04/24 0000  vancomycin  (VANCOREADY) IVPB 1500 mg/300 mL  Status:  Discontinued        1,500 mg 150 mL/hr over 120 Minutes Intravenous  Once 12/03/24 2236 12/03/24 2242   12/03/24 2300  piperacillin -tazobactam (ZOSYN ) IVPB 3.375 g        3.375 g 12.5 mL/hr over 240 Minutes Intravenous Every 8 hours 12/03/24 2236     11/30/24 2130  vancomycin  (VANCOCIN ) IVPB 1000 mg/200 mL premix        1,000 mg 200 mL/hr over 60 Minutes Intravenous  Once 11/30/24 1609 11/30/24 2351   11/30/24 1615  ceFAZolin  (ANCEF ) IVPB 2g/100 mL premix        2 g 200 mL/hr over 30 Minutes Intravenous Every 8 hours 11/30/24 1609 12/02/24 0546   11/30/24 0400  vancomycin  (VANCOREADY) IVPB 1250 mg/250 mL        1,250 mg 166.7 mL/hr over 90 Minutes Intravenous To Surgery 11/29/24 1040 11/30/24 0856   11/30/24 0400  ceFAZolin  (ANCEF ) IVPB 2g/100 mL premix        2 g 200 mL/hr over 30 Minutes Intravenous To Surgery 11/29/24 1040 11/30/24 1239   11/30/24 0400  ceFAZolin  (ANCEF ) IVPB 2g/100 mL premix  Status:  Discontinued        2 g 200 mL/hr over 30 Minutes Intravenous To Surgery 11/29/24 1036 11/30/24 1609       Assessment/Plan: POD 3/1 - status post ex lap with SBR & VAC placement, repeat laparotomy with patchy small bowel ischemia - VAC in place -  return to OR tomorrow for likely closure and ileostomy  Donnice Bury 12/07/2024

## 2024-12-07 NOTE — Progress Notes (Addendum)
 Patient ID: BARAKA KLATT, male   DOB: 01-15-1981, 43 y.o.   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  12/9 VT/VF ? vagal episode. Extubated 12/11: S/p R thoracentesis with resultant hemothorax.  Chest tube placed. S/p R intercostal artery coil (IR). S/p exp lap for ischemic bowel resection, bowel left in discontinuity 12/15: Back to OR 12/15 for ischemic R colon and mesenteric ischemia. S/p exp/lap, wound vac exchange, R colectomy without anastomosis.   2uPRBCs 12/14, hgb 10.3 today.  Chest tube output slowing.  Not on anticoagulation.  Plts 92 => 102 => 72 => 56.   Impella 5.5  P8 Flow 4.1 L/min No alarms LDH 241 => 299 => 324 No heparin , getting HCO3 purge only.  Plts 56  Swan: CVP 10-11 PA 58/24 (34) CI 3.6 Co-ox 74%  Milrinone  0.25 mcg, Epi @8 , off NE and vaso.  UOP -4.5L with no diuretic.   Intubated and sedated, on Zosyn  + vancomycin  with Tm 101.7.    Objective:   Weight Range: 82.9 kg Body mass index is 26.22 kg/m.   Vital Signs:   Temp:  [99 F (37.2 C)-101.7 F (38.7 C)] 101.7 F (38.7 C) (12/15 0600) Pulse Rate:  [85-105] 105 (12/15 0600) Resp:  [13-25] 22 (12/15 0600) BP: (82-133)/(61-102) 97/72 (12/15 0600) SpO2:  [90 %-100 %] 100 % (12/15 0600) Arterial Line BP: (88-135)/(51-90) 108/62 (12/15 0600) FiO2 (%):  [40 %-60 %] 50 % (12/15 0457) Weight:  [82.9 kg] 82.9 kg (12/15 0500) Last BM Date : 12/05/24  Weight change: Filed Weights   12/05/24 0500 12/06/24 0500 12/07/24 0500  Weight: 80.8 kg 81.3 kg 82.9 kg    Intake/Output:   Intake/Output Summary (Last 24 hours) at 12/07/2024 0710 Last data filed at 12/07/2024 0700 Gross per 24 hour  Intake 4682.3 ml  Output 5170 ml  Net -487.7 ml    CVP 10-11 Physical Exam  General:  intubated and sedated Neck: JVD UTA cm. LIJ swann Cor:  Regular rate & rhythm. No murmurs. Lungs: coarse throughout, R CT Abd: + wound vac in place Extremities: +1 BLE edema  GU: + foley Neuro: sedated  Telemetry   Accelerated junctional low 100s, intt NSVT (Personally reviewed)    Labs    CBC Recent Labs    12/06/24 0352 12/06/24 0612 12/07/24 0309 12/07/24 0456  WBC 14.3*  --  14.1*  --   HGB 9.7*   < > 10.3* 9.9*  HCT 27.4*   < > 29.5* 29.0*  MCV 85.1  --  86.3  --   PLT 72*  --  56*  --    < > = values in this interval not displayed.   Basic Metabolic Panel Recent Labs    87/85/74 0352 12/06/24 0612 12/07/24 0309 12/07/24 0456  NA 132*   < > 137 139  K 4.1   < > 3.5 3.5  CL 96*  --  102  --   CO2 27  --  27  --   GLUCOSE 131*  --  135*  --   BUN 25*  --  24*  --   CREATININE 0.92  --  0.80  --   CALCIUM  8.2*  --  7.7*  --   MG 2.0  --  1.9  --   PHOS 3.3  --  2.4*  --    < > =  values in this interval not displayed.   Liver Function Tests Recent Labs    12/05/24 0212 12/07/24 0309  AST 88* 89*  ALT 49* 34  ALKPHOS 45 96  BILITOT 2.3* 3.9*  PROT 4.8* 4.5*  ALBUMIN  2.9* 1.7*   No results for input(s): LIPASE, AMYLASE in the last 72 hours. Cardiac Enzymes No results for input(s): CKTOTAL, CKMB, CKMBINDEX, TROPONINI in the last 72 hours.  BNP: BNP (last 3 results) No results for input(s): BNP in the last 8760 hours.  ProBNP (last 3 results) Recent Labs    11/22/24 1958  PROBNP 3,354.0*     D-Dimer No results for input(s): DDIMER in the last 72 hours.  Hemoglobin A1C No results for input(s): HGBA1C in the last 72 hours. Fasting Lipid Panel Recent Labs    12/07/24 0309  TRIG 101   Thyroid Function Tests No results for input(s): TSH, T4TOTAL, T3FREE, THYROIDAB in the last 72 hours.  Invalid input(s): FREET3  Other results:   Imaging    DG CHEST PORT 1 VIEW Result Date: 12/04/2024 CLINICAL DATA:  Ventilator dependence. EXAM: PORTABLE CHEST 1 VIEW  COMPARISON:  12/03/2024 FINDINGS: Endotracheal tube tip is approximately 3.8 cm above the base of the carina. The NG tube passes into the stomach although the distal tip position is not included on the film. Right-sided Impella device is stable in position. A left IJ pulmonary artery catheter tip overlies expected location of the interlobar pulmonary artery. Right pleural drain again noted with persistent right pleural fluid. Basilar collapse/consolidation noted, IMPRESSION: 1. No substantial interval change. 2. Persistent right pleural effusion with right base collapse/consolidation. 3. Support apparatus as above. Electronically Signed   By: Camellia Candle M.D.   On: 12/04/2024 07:15   DG Abd 1 View Result Date: 12/04/2024 CLINICAL DATA:   Routine adult health maintenance Best images obtainable due to patient's condition EXAM: ABDOMEN - 1 VIEW COMPARISON:  12/04/2024 FINDINGS: NG tube tip is in the distal stomach. Diffuse gaseous distention of colon evident, similar to prior. IMPRESSION: NG tube tip is in the distal stomach. Electronically Signed   By: Camellia Candle M.D.   On: 12/04/2024 07:11   CT Angio Chest/Abd/Pel for Dissection W and/or W/WO Addendum Date: 12/03/2024 ADDENDUM #1 ADDENDUM: ---------------------------------------------------- These results were called to Dr. Daniel  at 10:15 pm on 12/03/2024. Electronically signed by: Franky Crease MD 12/03/2024 10:23 PM EST RP Workstation: [id number]   Result Date: 12/03/2024 ORIGINAL REPORT EXAM: CT CHEST, ABDOMEN AND PELVIS WITH AND WITHOUT CONTRAST 12/03/2024 09:52:07 PM TECHNIQUE: CT of the chest, abdomen and pelvis was performed with and without the administration of intravenous contrast. Multiplanar reformatted images are provided for review. Automated exposure control, iterative reconstruction, and/or weight based adjustment of the mA/kV was utilized to reduce the radiation dose to as low as reasonably achievable. COMPARISON: None available.  CLINICAL HISTORY: Acute aortic syndrome (AAS) suspected; Evaluate for intercostal bleeding or other sources after thoracentesis; also evaluate bowel. FINDINGS: CHEST: MEDIASTINUM AND LYMPH NODES: Mild cardiomegaly. Impella device tip in the left ventricle. Swan-ganz catheter tip in the central right pulmonary artery. No evidence of aortic aneurysm or dissection. The central airways are clear. No mediastinal, hilar or axillary lymphadenopathy. LUNGS AND PLEURA: Right chest tube in place. Moderate to large right pleural effusion with compressive atelectasis in the right middle lobe and right lower lobe. There appears to be active extravasation of contrast posteriorly between the right 9th and 10th ribs and likely into the pleural space, possibly related to  prior thoracentesis. The pleural fluid appears complex on the right, likely hemothorax. Small left pleural effusion with compressive atelectasis in the left lower lobe. Tiny right pneumothorax. Biapical subpleural blebs. No evidence of pulmonary embolus. ABDOMEN AND PELVIS: LIVER: Gas is seen branching peripherally in the liver compatible with portal venous gas. GALLBLADDER AND BILE DUCTS: Gallbladder is unremarkable. No biliary ductal dilatation. SPLEEN: No acute abnormality. PANCREAS: No acute abnormality. ADRENAL GLANDS: No acute abnormality. KIDNEYS, URETERS AND BLADDER: No stones in the kidneys or ureters. No hydronephrosis. No perinephric or periureteral stranding. Urinary bladder is unremarkable. GI AND BOWEL: Stomach and small bowel are decompressed. Diffuse gaseous distention of the colon suggests ileus. Moderate stool burden in the rectosigmoid colon. Pneumatosis noted in small bowel loops in the lower pelvis with air in the mesenteric veins feeding these small bowel loops. Pneumatosis also noted in the right colon wall. REPRODUCTIVE ORGANS: No acute abnormality. PERITONEUM AND RETROPERITONEUM: No ascites. No free air. VASCULATURE: Aorta is normal in  caliber. ABDOMINAL AND PELVIS LYMPH NODES: No lymphadenopathy. BONES AND SOFT TISSUES: No acute osseous abnormality. No focal soft tissue abnormality. IMPRESSION: 1. Active contrast extravasation between the right 9th and 10th ribs with likely extension into the pleural space, suspicious for ongoing intercostal bleeding likely related to recent thoracentesis. 2. Moderate to large right pleural effusion compatible with hemothorax with compressive atelectasis in the right middle and lower lobes; right chest tube present; tiny right pneumothorax. 3. No evidence of aortic aneurysm, aortic dissection, or pulmonary embolus. 4. Pneumatosis involving small bowel loops in the lower pelvis and the right colon with mesenteric venous gas and portal venous gas, concerning for bowel ischemia. Recommend surgical consultation. 5. Small left pleural effusion with compressive atelectasis in the left lower lobe. 6. . Attempts are being made to contact the ordering physician with the results. An addendum will be made at that time. Electronically signed by: Franky Crease MD 12/03/2024 10:09 PM EST RP Workstation: [id number]   DG Chest Port 1 View Result Date: 12/03/2024 EXAM: 1 VIEW(S) XRAY OF THE CHEST 12/03/2024 07:57:00 PM COMPARISON: Portable chest today at 7:02 pm. CLINICAL HISTORY: Encounter for chest tube placement. FINDINGS: LINES, TUBES AND DEVICES: A pigtail chest tube has been inserted on the right through the 6th intercostal space, with the pigtail in the lateral upper right thorax. Left IJ Swan-Ganz line again terminates in the distal right pulmonary artery. Stable positioning of right subclavian Impella device. LUNGS AND PLEURA: No pneumothorax. Moderate to large right pleural effusion is still present but decreased in the interval. Small left pleural effusion unchanged. The right mid to lower lung is obscured by pleural fluid. There is patchy consolidation in the left lower lung field. Left mid and both apical lungs are  clear. HEART AND MEDIASTINUM: Sternotomy sutures and two cardiac valve replacements are again shown. There is mild cardiomegaly and mild central vascular prominence as before. BONES AND SOFT TISSUES: No acute osseous abnormality. IMPRESSION: 1. Right pigtail chest tube in position with no measurable pneumothorax. 2. Moderate to large right pleural effusion, decreased compared to earlier today. 3. Patchy consolidation in the left lower lung field. 4. Small left pleural effusion, unchanged. 5. Cardiomegaly . Electronically signed by: Francis Quam MD 12/03/2024 08:10 PM EST RP Workstation: [id number]   DG CHEST PORT 1 VIEW Result Date: 12/03/2024 EXAM: 1 VIEW(S) XRAY OF THE CHEST 12/03/2024 07:05:00 PM COMPARISON: None available. CLINICAL HISTORY: ABLA (acute blood loss anemia). FINDINGS: LINES, TUBES AND DEVICES: Impella device and Swan-Ganz catheter remain in  place, unchanged. LUNGS AND PLEURA: Enlarging right pleural effusion, now large with atelectasis throughout the right lung. Vascular congestion and left basilar atelectasis. Low lung volumes. No pneumothorax. HEART AND MEDIASTINUM: No acute abnormality of the cardiac and mediastinal silhouettes. BONES AND SOFT TISSUES: No acute osseous abnormality. IMPRESSION: 1. Enlarging right pleural effusion, now large, with associated right lung atelectasis. 2. Vascular congestion with left basilar atelectasis. 3. Low lung volumes. Electronically signed by: Franky Crease MD 12/03/2024 07:10 PM EST RP Workstation: [id number]   DG Chest Port 1 View Result Date: 12/03/2024 CLINICAL DATA:  Pleural effusion, status post thoracentesis EXAM: PORTABLE CHEST 1 VIEW COMPARISON:  12/03/2024 FINDINGS: Single frontal view of the chest demonstrates stable left internal jugular flow directed central venous catheter, tip overlying the right infrahilar region. Stable mitral and aortic valve prostheses. Cardiac silhouette remains enlarged. Lung volumes are diminished, with bibasilar  veiling opacities, right greater than left, consistent with consolidation and effusions. No appreciable change since prior study. No evidence of pneumothorax. IMPRESSION: 1. Persistent bibasilar consolidation and effusions, right greater than left. 2. No evidence of pneumothorax. 3. Stable enlarged cardiac silhouette. Electronically Signed   By: Ozell Daring M.D.   On: 12/03/2024 16:10    Medications:   Scheduled Medications:  sodium chloride    Intravenous Once   sodium chloride    Intravenous Once   sodium chloride    Intravenous Once   sodium chloride    Intravenous Once   sodium chloride    Intravenous Once   sodium chloride    Intravenous Once   arformoterol   15 mcg Nebulization BID   Chlorhexidine  Gluconate Cloth  6 each Topical Daily   fentaNYL  (SUBLIMAZE ) injection  25-50 mcg Intravenous Once   folic acid   1 mg Intravenous Daily   insulin  aspart  0-24 Units Subcutaneous Q4H   lidocaine   1 patch Transdermal Q24H   lidocaine -EPINEPHrine   20 mL Intradermal Once   mouth rinse  15 mL Mouth Rinse Q2H   pantoprazole  (PROTONIX ) IV  40 mg Intravenous Daily   revefenacin   175 mcg Nebulization Daily   sodium chloride  flush  3 mL Intravenous Q12H   thiamine  (VITAMIN B1) injection  100 mg Intravenous Daily    Infusions:  sodium chloride  Stopped (12/02/24 1110)   amiodarone  30 mg/hr (12/07/24 0600)   epinephrine  8 mcg/min (12/07/24 0600)   fentaNYL  infusion INTRAVENOUS 100 mcg/hr (12/07/24 0612)   milrinone  0.25 mcg/kg/min (12/07/24 0600)   norepinephrine  (LEVOPHED ) Adult infusion Stopped (12/06/24 1620)   piperacillin -tazobactam 3.375 g (12/07/24 0611)   propofol  (DIPRIVAN ) infusion 30 mcg/kg/min (12/07/24 0600)   sodium bicarbonate  25 mEq (Impella PURGE) in dextrose  5 % 1000 mL bag     TPN ADULT (ION) 60 mL/hr at 12/07/24 0600   vancomycin  Stopped (12/06/24 2241)   vasopressin  Stopped (12/06/24 1520)    PRN Medications: sodium chloride , sodium chloride , albuterol , fentaNYL , fentaNYL   (SUBLIMAZE ) injection, hydrALAZINE , ondansetron  (ZOFRAN ) IV, mouth rinse, mouth rinse, oxyCODONE , sodium chloride , sodium chloride  flush  Patient Profile  43 y.o. male w/ h/o heavy alcohol use (at least 5 drinks per evening) and h/o asthma admitted w/ acute systolic heart failure. Strong family history of cardiac disease: mother and maternal grandmother had heart failure and father with CAD.    HFrEF. Severe valvular diease.  Assessment/Plan  1.  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  Valvular Disease  - severe MR posteriorly directed, mod TR. Likely functional.  - severe AI.-->CMR - Severe AR/MR  - S/P bioprosthetic AVR/MVR with Impella 5.5 12/8 - Will need  coumadin when Impella out per TCTS  2. Acute Systolic Heart Failure w/ Biventricular Dysfunction  - HS trop not c/w ACS but EKG w/ anteroseptal Qs  - CMRI Severe AR/MR. LVEF 35%  - Cath- normal cors, RA5, PA 32/17 (24), PVR 2.57 Papi 3. CO 4.9 CI 3.8 - Post-op Echo 12/02/24: EF < 20% with severe LVH (new, ?inflammatory), moderately decreased RV function, stable bioprosthetic MV and AoV.  - Fluid resuscitation with bleeding on 12/11 and Lasix  stopped. CVP 10-11 today, CI 3.6 with co-ox 74%. - Give lasix  60mg  x1. Auto diuresing pretty well, weight up 3 lbs, add ted hose.  - Today on milrinone  0.25 mcg, Epi @8 , off NE and vaso @.  Would leave epinephrine  as slow wean given RV dysfunction.  - Impella @ P8 Flow 4.1.  No heparin  gtt with bleeding, HCO3 purge only.   - Hold digoxin  - Holding GDMT until back off pressors.   3. R hemothorax - Hemothorax s/p right thoracentesis with intercostal artery injury. Massive bleeding requiring multiple products and development of hemorrhagic shock, now improved. S/p IR embolization.  - Hgb 8.9>7.8 >9.2>7.5>>>10.2>9.7>10.3 - Transfuse hgb < 8.  - heparin  remains on hold.  - Chest tube in place, output has slowed.   4. Transaminates - Hepatitis panel negative. RUQ US  unremarkable  - suspect 2/2  CHF/hepatic congestion + chronic ETOH use    5.  ETOH Abuse - heavy drinker, at least 5 drinks per evening - may be contributing to CM, reduction of intake imperative  - No evidence of withdrawal   6  Hypomagnesemia - follow; goal >2  - replete   7 . DMII  - Hgb A1C 6.1 - On SSI.  8. Ischemic bowel Mesenteric ischemia - CXR with dilated bowels.  - CT scan demonstrated active extravasation from a posterior intercostal artery. Also demonstrated portal venous gas and pneumatosis of the small and large bowel.  - S/p exp/lap 12/11. Patchy ischemia of the distal ileum extending over about 75 cm without perforation. Ischemic segment resected.  Colon was distended with gas but viable. Small bowel left in discontinuity.  - Back to OR 12/15 for ischemic R colon and mesenteric ischemia. S/p exp/lap, wound vac exchange, R colectomy without anastomosis.   9. ID - Covering bowel pathogens with Zosyn .  - Tm 101.7, continue vancomycin .   10. AKI - AKI with shock, creatinine lower at 1.58 => 0.92>.80 today.   11. FEN - TPN  12. Rhythm - NSR today, has been in accelerated junctional rhythm as well.   13. VT - On amiodarone  gtt.   CRITICAL CARE Performed by: Beckey LITTIE Coe   Total critical care time: 17 minutes  Critical care time was exclusive of separately billable procedures and treating other patients.  Critical care was necessary to treat or prevent imminent or life-threatening deterioration.  Critical care was time spent personally by me on the following activities: development of treatment plan with patient and/or surrogate as well as nursing, discussions with consultants, evaluation of patient's response to treatment, examination of patient, obtaining history from patient or surrogate, ordering and performing treatments and interventions, ordering and review of laboratory studies, ordering and review of radiographic studies, pulse oximetry and re-evaluation of patient's condition.    Length of Stay: 14  Beckey LITTIE Coe, NP  12/07/2024, 7:10 AM  Advanced Heart Failure Team Pager [phone] (M-F; 7a - 5p)  Please contact [facility] for night-coverage after hours (5p -7a ) and weekends on [email or web address]  Patient  seen with NP, I formulated the plan and agree with the above note.   Back to OR yesterday with additional ischemic bowel removed, abdomen remains open. On vancomycin /Zosyn . Tm 101.8.   Good UOP, I/Os net negative 500 cc.  He is on epinephrine  8 + milrinone  0.25 with CVP 14 this morning on my read and CI 3.6 from [place].  Creatinine stable at 0.8.   He is in NSR on amiodarone  30 mg/hr.   General: NAD Neck: JVP 14 cm, no thyromegaly or thyroid nodule.  Lungs: Decreased at bases.  CV: Nondisplaced PMI.  Heart regular S1/S2, no S3/S4, no murmur.  No peripheral edema.   Abdomen: Soft, nontender, no hepatosplenomegaly, no distention.  Skin: Intact without lesions or rashes.  Neurologic: Alert and oriented x 3.  Psych: Normal affect. Extremities: No clubbing or cyanosis.  HEENT: Normal.   Stable hemodynamics today, Impella 5.5 P8 with flow 4.3 and co-ox 74%.  Continue milrinone  0.25, with ischemic gut will try to transition off epinephrine  and onto norepinephrine  today.  Plts lower and LDH higher, will assess Impella position via echo today => Impella tip at 4.6 cm this morning, RV function looks better (mild to moderately hypokinetic).   Weight up with CVP 14.  Lasix  60 mg IV bid today, replace K.   On vancomycin /Zosyn  with fever to 101.8, has open abdomen.   CRITICAL CARE Performed by: Ezra Shuck  Total critical care time: 45 minutes  Critical care time was exclusive of separately billable procedures and treating other patients.  Critical care was necessary to treat or prevent imminent or life-threatening deterioration.  Critical care was time spent personally by me on the following activities: development of treatment plan with patient and/or surrogate  as well as nursing, discussions with consultants, evaluation of patient's response to treatment, examination of patient, obtaining history from patient or surrogate, ordering and performing treatments and interventions, ordering and review of laboratory studies, ordering and review of radiographic studies, pulse oximetry and re-evaluation of patient's condition.  Ezra Shuck 12/07/2024 9:07 AM

## 2024-12-07 NOTE — TOC Progression Note (Signed)
 Transition of Care [facility]) - Progression Note    Patient Details  Name: Alejandro Lopez MRN: [id number] Date of Birth: 18-Sep-1981  [facility] CM/SW Contact  Justina Delcia Czar, RN Phone Number: [phone] 12/07/2024, 1:13 PM  Clinical Narrative:     Patient remains intubated on amiodarone , Milrinone  gtt and TPN.  Scheduled for ostomy on 12/08/2024.  Chart reviewed for discharge readiness, patient not medically stable for d/c. Inpatient CM/CSW will continue to monitor pt's advancement through interdisciplinary progression rounds.    If new pt transition needs arise, MD please place a TOC consult.   Will evaluate LTAC appropriateness with care team.   Expected Discharge Plan: Home/Self Care Barriers to Discharge: Continued Medical Work up    Expected Discharge Plan and Services In-house Referral: Clinical Social Work Discharge Planning Services: CM Consult   Living arrangements for the past 2 months: Single Family Home                     Social Drivers of Health (SDOH) Interventions SDOH Screenings   Food Insecurity: No Food Insecurity (11/22/2024)  Housing: Low Risk (11/22/2024)  Transportation Needs: No Transportation Needs (11/22/2024)  Utilities: Not At Risk (11/22/2024)  Social Connections: Unknown (05/06/2022)   Received from [facility]  Tobacco Use: High Risk (12/06/2024)    Readmission Risk Interventions     No data to display

## 2024-12-07 NOTE — Progress Notes (Addendum)
 PHARMACY - TOTAL PARENTERAL NUTRITION CONSULT NOTE   Indication: massive bowel resection   Patient Measurements: Height: 5' 10 (177.8 cm) Weight: 82.9 kg (182 lb 12.2 oz) IBW/kg (Calculated) : 73 TPN AdjBW (KG): 69.9 Body mass index is 26.22 kg/m. Usual Weight: weight 79kg on admission, unclear baseline.   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 Patient underwent an aortic and mitral valve replacement on 12/8 with insertion of Impella. Post-op was found to have continuing HgB drop with noted abdominal pain. Was found to have hemorrhage from intercostal, chest tube and embolization was completed. Workup for dropping HgB included CT angio of chest/ab/and pelvis showed pneumatosis and concern for bowel ischemia. Patient went to OR on 12/12 for ex-lap found to have ischemia of distal ileum over 75cm without perforation. Patient was left in discontinuity with plans to return to OR.  Patient with diet throughout admission however noted nausea and abdominal pain during peri-op time period. Pharmacy consulted to manage TPN.   Glucose / Insulin : no hx DM, A1c 6.1% - CBGs < 180 Used 10 units SSI in the past 24 hrs Electrolytes: Na/CL wnl 139/102, CoCa 9.9  (iCa low at 1.11), Phos down 2.4, K down 3.5, mg 1.9  Renal: AKI resolved - SCr down < 1 (BL SCr ~ 0.7), BUN 24. Plans for lasix  60 mg x 2 today.  Hepatic: AST/ALT mildly elevated, tbili 2.3 (no jaundice), alk phos / TG WNL, albumin  1.5 *elevation not due to TPN Intake / Output; MIVF: UOP 2.1 ml/kg/hr, NG 0mL, drain , chest tube down to , LBM 12/13, net -6.1L (improving) GI Imaging: 12/11 CT: pneumatosis of small bowel with gas, concerning for bowel ischemia  GI Surgeries / Procedures:  12/11 ex-lap with distal ileum with ischemia (75cm), left in discontinuity with plans to return to OR on Sunday to evaluate for further  ischemia   12/14 ex lap, placement of wound vac, right colectomy due to necrosis, abdomen left open  12/16 tentative return to OR for closure and ileostomy   Central access: PICC placed 12/04/24 TPN start date: 12/04/24  Nutritional Goals:  Goal concentrated TPN rate without lipids is 50mL/hr (90g/L AA and 22% CHO provides 130g AA, 317g CHO, and 1596 kCal per day)  Goal concentrated TPN rate with lipids is 70 mL/hr (78g/L AA, 38g/L ILE, and 16.5% CHO will provide 130g AA, 274g CHO, 64g ILE and 2107 kcals per day)  RD Estimated Needs Total Energy Estimated Needs: 2100-2300 kcals Total Protein Estimated Needs: 125-150 g Total Fluid Estimated Needs: 1.8 L  Current Nutrition:  TPN Propofol  15.9 ml/hr = 422 kCal per day  Plan:  Concentrate TPN as fluid resuscitation is completed and anticipating diuresis again TPN without lipids at goal of 60 ml/hr will provide 130g AA, 317g CHO and 1596 kCal.  kCal with Propofol  will be 2018 per day. TPN and Propofol  will meet >95% of needs Electrolytes in TPN: continue Na at 100 mEq/L, incr K 35mEq/L, incr Ca 58mEq/L, incr Mg 66mEq/L, incr Phos 25mmol/L, max CL Add standard MVI and trace elements to TPN Continue CVTS SSI Q4H Monitor TPN labs on Mon/Thurs - labs in AM F/u with transitioning off Propofol  as able   Give KCL IV x 2 runs  Give magnesium  sulfate 2 g IV  Give K phos  15 mmol IV   Gwendolin Briel, PharmD

## 2024-12-08 ENCOUNTER — Inpatient Hospital Stay (HOSPITAL_COMMUNITY): Payer: Self-pay | Admitting: Certified Registered Nurse Anesthetist

## 2024-12-08 ENCOUNTER — Inpatient Hospital Stay (HOSPITAL_COMMUNITY)

## 2024-12-08 ENCOUNTER — Encounter (HOSPITAL_COMMUNITY)
Admission: EM | Disposition: A | Discharge status: Rehabilitation Facility | Payer: Self-pay | Source: Home / Self Care | Attending: Surgery

## 2024-12-08 DIAGNOSIS — I110 Hypertensive heart disease with heart failure: Secondary | ICD-10-CM | POA: Diagnosis not present

## 2024-12-08 DIAGNOSIS — F17210 Nicotine dependence, cigarettes, uncomplicated: Secondary | ICD-10-CM

## 2024-12-08 DIAGNOSIS — I5021 Acute systolic (congestive) heart failure: Secondary | ICD-10-CM | POA: Diagnosis not present

## 2024-12-08 DIAGNOSIS — Z95811 Presence of heart assist device: Secondary | ICD-10-CM

## 2024-12-08 DIAGNOSIS — Z432 Encounter for attention to ileostomy: Secondary | ICD-10-CM

## 2024-12-08 DIAGNOSIS — I10 Essential (primary) hypertension: Secondary | ICD-10-CM

## 2024-12-08 HISTORY — PX: ILEOSTOMY: SHX1783

## 2024-12-08 HISTORY — PX: LAPAROTOMY: SHX154

## 2024-12-08 HISTORY — PX: BOWEL RESECTION: SHX1257

## 2024-12-08 LAB — POCT I-STAT 7, (LYTES, BLD GAS, ICA,H+H)
Acid-Base Excess: 3 mmol/L — ABNORMAL HIGH (ref 0.0–2.0)
Bicarbonate: 28.6 mmol/L — ABNORMAL HIGH (ref 20.0–28.0)
Calcium, Ion: 1.16 mmol/L (ref 1.15–1.40)
HCT: 42 % (ref 39.0–52.0)
Hemoglobin: 14.3 g/dL (ref 13.0–17.0)
O2 Saturation: 99 %
Patient temperature: 37.9
Potassium: 3.2 mmol/L — ABNORMAL LOW (ref 3.5–5.1)
Sodium: 142 mmol/L (ref 135–145)
TCO2: 30 mmol/L (ref 22–32)
pCO2 arterial: 49.6 mmHg — ABNORMAL HIGH (ref 32–48)
pH, Arterial: 7.373 (ref 7.35–7.45)
pO2, Arterial: 143 mmHg — ABNORMAL HIGH (ref 83–108)

## 2024-12-08 LAB — COOXEMETRY PANEL
Carboxyhemoglobin: 2.5 % — ABNORMAL HIGH (ref 0.5–1.5)
Methemoglobin: 0.7 % (ref 0.0–1.5)
O2 Saturation: 88.8 %
Total hemoglobin: 11 g/dL — ABNORMAL LOW (ref 12.0–16.0)

## 2024-12-08 LAB — BASIC METABOLIC PANEL WITH GFR
Anion gap: 8 (ref 5–15)
BUN: 28 mg/dL — ABNORMAL HIGH (ref 6–20)
CO2: 29 mmol/L (ref 22–32)
Calcium: 8.3 mg/dL — ABNORMAL LOW (ref 8.9–10.3)
Chloride: 106 mmol/L (ref 98–111)
Creatinine, Ser: 0.81 mg/dL (ref 0.61–1.24)
GFR, Estimated: >60 mL/min (ref >60)
Glucose, Bld: 129 mg/dL — ABNORMAL HIGH (ref 70–99)
Potassium: 3.2 mmol/L — ABNORMAL LOW (ref 3.5–5.1)
Sodium: 143 mmol/L (ref 135–145)

## 2024-12-08 LAB — CULTURE, RESPIRATORY W GRAM STAIN

## 2024-12-08 LAB — COMPREHENSIVE METABOLIC PANEL WITH GFR
ALT: 39 U/L (ref 0–44)
AST: 124 U/L — ABNORMAL HIGH (ref 15–41)
Albumin: 1.5 g/dL — ABNORMAL LOW (ref 3.5–5.0)
Alkaline Phosphatase: 112 U/L (ref 38–126)
Anion gap: 12 (ref 5–15)
BUN: 28 mg/dL — ABNORMAL HIGH (ref 6–20)
CO2: 27 mmol/L (ref 22–32)
Calcium: 7.9 mg/dL — ABNORMAL LOW (ref 8.9–10.3)
Chloride: 104 mmol/L (ref 98–111)
Creatinine, Ser: 0.8 mg/dL (ref 0.61–1.24)
GFR, Estimated: >60 mL/min (ref >60)
Glucose, Bld: 112 mg/dL — ABNORMAL HIGH (ref 70–99)
Potassium: 3.3 mmol/L — ABNORMAL LOW (ref 3.5–5.1)
Sodium: 143 mmol/L (ref 135–145)
Total Bilirubin: 3.9 mg/dL — ABNORMAL HIGH (ref 0.0–1.2)
Total Protein: 4.6 g/dL — ABNORMAL LOW (ref 6.5–8.1)

## 2024-12-08 LAB — CBC
HCT: 30.6 % — ABNORMAL LOW (ref 39.0–52.0)
HCT: 32.2 % — ABNORMAL LOW (ref 39.0–52.0)
Hemoglobin: 10.5 g/dL — ABNORMAL LOW (ref 13.0–17.0)
Hemoglobin: 11.1 g/dL — ABNORMAL LOW (ref 13.0–17.0)
MCH: 30.2 pg (ref 26.0–34.0)
MCH: 30.2 pg (ref 26.0–34.0)
MCHC: 34.3 g/dL (ref 30.0–36.0)
MCHC: 34.5 g/dL (ref 30.0–36.0)
MCV: 87.9 fL (ref 80.0–100.0)
MCV: 87.7 fL (ref 80.0–100.0)
Platelets: 61 K/uL — ABNORMAL LOW (ref 150–400)
Platelets: 80 K/uL — ABNORMAL LOW (ref 150–400)
RBC: 3.48 MIL/uL — ABNORMAL LOW (ref 4.22–5.81)
RBC: 3.67 MIL/uL — ABNORMAL LOW (ref 4.22–5.81)
RDW: 17.4 % — ABNORMAL HIGH (ref 11.5–15.5)
RDW: 17.6 % — ABNORMAL HIGH (ref 11.5–15.5)
WBC: 12.1 K/uL — ABNORMAL HIGH (ref 4.0–10.5)
WBC: 12 K/uL — ABNORMAL HIGH (ref 4.0–10.5)
nRBC: 2.9 % — ABNORMAL HIGH (ref 0.0–0.2)
nRBC: 1.8 % — ABNORMAL HIGH (ref 0.0–0.2)

## 2024-12-08 LAB — CULTURE, BLOOD (ROUTINE X 2)
Culture: NO GROWTH
Culture: NO GROWTH
Special Requests: ADEQUATE
Special Requests: ADEQUATE

## 2024-12-08 LAB — LACTATE DEHYDROGENASE: LDH: 444 U/L — ABNORMAL HIGH (ref 105–235)

## 2024-12-08 LAB — SURGICAL PATHOLOGY

## 2024-12-08 LAB — ECHOCARDIOGRAM LIMITED
Height: 70 in
Weight: 2885.38 [oz_av]

## 2024-12-08 LAB — GLUCOSE, CAPILLARY
Glucose-Capillary: 118 mg/dL — ABNORMAL HIGH (ref 70–99)
Glucose-Capillary: 103 mg/dL — ABNORMAL HIGH (ref 70–99)
Glucose-Capillary: 118 mg/dL — ABNORMAL HIGH (ref 70–99)
Glucose-Capillary: 128 mg/dL — ABNORMAL HIGH (ref 70–99)

## 2024-12-08 LAB — PHOSPHORUS: Phosphorus: 3.3 mg/dL (ref 2.5–4.6)

## 2024-12-08 LAB — MAGNESIUM
Magnesium: 1.6 mg/dL — ABNORMAL LOW (ref 1.7–2.4)
Magnesium: 1.8 mg/dL (ref 1.7–2.4)

## 2024-12-08 MED ORDER — FUROSEMIDE 10 MG/ML IJ SOLN
80.0000 mg | Freq: Once | INTRAMUSCULAR | Status: AC
  Administered 2024-12-08: 10:00:00 80 mg via INTRAVENOUS
  Filled 2024-12-08: qty 8

## 2024-12-08 MED ORDER — POTASSIUM CHLORIDE 10 MEQ/50ML IV SOLN
10.0000 meq | INTRAVENOUS | Status: AC
  Administered 2024-12-08 – 2024-12-09 (×6): 10 meq via INTRAVENOUS
  Filled 2024-12-08 (×6): qty 50

## 2024-12-08 MED ORDER — HEPARIN SODIUM (PORCINE) 5000 UNIT/ML IJ SOLN
5000.0000 [IU] | Freq: Three times a day (TID) | INTRAMUSCULAR | Status: DC
  Administered 2024-12-08 – 2024-12-09 (×3): 5000 [IU] via SUBCUTANEOUS
  Filled 2024-12-08 (×3): qty 1

## 2024-12-08 MED ORDER — INSULIN ASPART 100 UNIT/ML IJ SOLN
0.0000 [IU] | INTRAMUSCULAR | Status: DC
  Administered 2024-12-08 – 2024-12-09 (×2): 2 [IU] via SUBCUTANEOUS
  Filled 2024-12-08 (×2): qty 2

## 2024-12-08 MED ORDER — MAGNESIUM SULFATE 4 GM/100ML IV SOLN
4.0000 g | Freq: Once | INTRAVENOUS | Status: AC
  Administered 2024-12-08: 10:00:00 4 g via INTRAVENOUS
  Filled 2024-12-08: qty 100

## 2024-12-08 MED ORDER — SODIUM CHLORIDE 0.9 % IV SOLN: INTRAVENOUS | Status: AC | PRN

## 2024-12-08 MED ORDER — FUROSEMIDE 10 MG/ML IJ SOLN
80.0000 mg | Freq: Once | INTRAMUSCULAR | Status: AC
  Administered 2024-12-08: 17:00:00 80 mg via INTRAVENOUS
  Filled 2024-12-08: qty 8

## 2024-12-08 MED ORDER — MAGNESIUM SULFATE 2 GM/50ML IV SOLN: 2.0000 g | Freq: Once | INTRAVENOUS | Status: DC

## 2024-12-08 MED ORDER — POTASSIUM CHLORIDE 10 MEQ/50ML IV SOLN
10.0000 meq | INTRAVENOUS | Status: AC
  Administered 2024-12-08 (×4): 10 meq via INTRAVENOUS
  Filled 2024-12-08 (×3): qty 50

## 2024-12-08 MED ORDER — ACETAMINOPHEN 10 MG/ML IV SOLN
650.0000 mg | Freq: Four times a day (QID) | INTRAVENOUS | Status: AC
  Administered 2024-12-08: 23:00:00 650 mg via INTRAVENOUS
  Administered 2024-12-09: 12:00:00 1000 mg via INTRAVENOUS
  Administered 2024-12-09: 05:00:00 650 mg via INTRAVENOUS
  Filled 2024-12-08 (×3): qty 100

## 2024-12-08 MED ORDER — 0.9 % SODIUM CHLORIDE (POUR BTL) OPTIME
TOPICAL | Status: DC | PRN
  Administered 2024-12-08: 14:00:00 2000 mL

## 2024-12-08 MED ORDER — TRACE MINERALS CU-MN-SE-ZN 300-55-60-3000 MCG/ML IV SOLN
INTRAVENOUS | Status: AC
  Filled 2024-12-08: qty 864

## 2024-12-08 MED ORDER — MAGNESIUM SULFATE 2 GM/50ML IV SOLN
2.0000 g | Freq: Once | INTRAVENOUS | Status: AC
  Administered 2024-12-08: 19:00:00 2 g via INTRAVENOUS
  Filled 2024-12-08: qty 50

## 2024-12-08 MED ORDER — ROCURONIUM BROMIDE 10 MG/ML (PF) SYRINGE
PREFILLED_SYRINGE | INTRAVENOUS | Status: DC | PRN
  Administered 2024-12-08: 13:00:00 100 mg via INTRAVENOUS
  Administered 2024-12-08: 14:00:00 30 mg via INTRAVENOUS

## 2024-12-08 MED ORDER — POTASSIUM CHLORIDE 10 MEQ/50ML IV SOLN
10.0000 meq | INTRAVENOUS | Status: DC
  Administered 2024-12-08 (×2): 10 meq via INTRAVENOUS
  Filled 2024-12-08 (×3): qty 50

## 2024-12-08 MED ORDER — FUROSEMIDE 10 MG/ML IJ SOLN: 60.0000 mg | Freq: Once | INTRAMUSCULAR | Status: DC

## 2024-12-08 NOTE — Progress Notes (Addendum)
 Patient ID: Alejandro Lopez, male   DOB: August 05, 1981, 43 y.o.   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  12/9 VT/VF ? vagal episode. Extubated 12/11: S/p R thoracentesis with resultant hemothorax.  Chest tube placed. S/p R intercostal artery coil (IR). S/p exp lap for ischemic bowel resection, bowel left in discontinuity 12/15: Back to OR 12/15 for ischemic R colon and mesenteric ischemia. S/p exp/lap, wound vac exchange, R colectomy without anastomosis.   2uPRBCs 12/14, hgb 10.5 today.  Chest tube output - .  Wound vac output . Not on anticoagulation.  Plts 92 => 102 => 72 => 56>61.   Impella 5.5  P8 Flow 4.1 L/min No alarms LDH 241 => 299 => 324> 444 No heparin , getting HCO3 purge only.   Swan: CVP  6/7 PA 55/23 (33) CI 3.5 CO 6.5 Co-ox 89%?  Milrinone  0.25 mcg, NE @ 4.  UOP -4.8L with lasix  60 IV BID.   Intubated and sedated, on Zosyn  + vancomycin  with Tm 101.3.    Objective:   Weight Range: 81.8 kg Body mass index is 25.88 kg/m.   Vital Signs:   Temp:  [98.8 F (37.1 C)-101.5 F (38.6 C)] 100.2 F (37.9 C) (12/16 0645) Pulse Rate:  [89-109] 98 (12/16 0645) Resp:  [8-28] 22 (12/16 0645) BP: (88-152)/(65-130) 98/75 (12/16 0400) SpO2:  [96 %-100 %] 100 % (12/16 0645) Arterial Line BP: (90-137)/(45-83) 122/76 (12/16 0645) FiO2 (%):  [40 %] 40 % (12/16 0400) Weight:  [81.8 kg] 81.8 kg (12/16 0500) Last BM Date : 12/07/24  Weight change: Filed Weights   12/06/24 0500 12/07/24 0500 12/08/24 0500  Weight: 81.3 kg 82.9 kg 81.8 kg    Intake/Output:   Intake/Output Summary (Last 24 hours) at 12/08/2024 0714 Last data filed at 12/08/2024 0600 Gross per 24 hour  Intake 4662.68 ml  Output 6160 ml  Net -1497.32 ml    CVP 6/7 Physical Exam  General:  intubated and sedated Neck: JVD UTA cm.LIJ  Swann Cor: Regular rate & rhythm. +Impella, site stable Lungs: clear, diminished bases. R CT Abd: + wound vac in place Extremities: +1 BLE edema, + TED hose GU: + foley Neuro: sedated  Telemetry   SR 90s, intt NSVT (Personally reviewed)    Labs    CBC Recent Labs    12/07/24 0309 12/07/24 0456 12/08/24 0410 12/08/24 0423  WBC 14.1*  --  12.1*  --   HGB 10.3*   < > 10.5* 14.3  HCT 29.5*   < > 30.6* 42.0  MCV 86.3  --  87.9  --   PLT 56*  --  61*  --    < > = values in this interval not displayed.   Basic Metabolic Panel Recent Labs    87/85/74 0352 12/06/24 0612 12/07/24 0309 12/07/24 0456 12/07/24 1509 12/08/24 0410 12/08/24 0423  NA 132*   < > 137   < > 140 143 142  K 4.1   < > 3.5   < > 3.3* 3.3* 3.2*  CL 96*  --  102  --  105 104  --   CO2 27  --  27  --  27 27  --   GLUCOSE 131*  --  135*  --  117* 112*  --   BUN 25*  --  24*  --  24* 28*  --   CREATININE 0.92  --  0.80  --  0.75 0.80  --   CALCIUM  8.2*  --  7.7*  --  7.8* 7.9*  --   MG 2.0  --  1.9  --  1.7 1.6*  --   PHOS 3.3  --  2.4*  --   --   --   --    < > = values in this interval not displayed.   Liver Function Tests Recent Labs    12/07/24 0309 12/08/24 0410  AST 89* 124*  ALT 34 39  ALKPHOS 96 112  BILITOT 3.9* 3.9*  PROT 4.5* 4.6*  ALBUMIN  1.7* 1.5*   No results for input(s): LIPASE, AMYLASE in the last 72 hours. Cardiac Enzymes No results for input(s): CKTOTAL, CKMB, CKMBINDEX, TROPONINI in the last 72 hours.  BNP: BNP (last 3 results) No results for input(s): BNP in the last 8760 hours.  ProBNP (last 3 results) Recent Labs    11/22/24 1958  PROBNP 3,354.0*     D-Dimer No results for input(s): DDIMER in the last 72 hours.  Hemoglobin A1C No results for input(s): HGBA1C in the last 72 hours. Fasting Lipid Panel Recent Labs    12/07/24 0309  TRIG 101   Thyroid Function Tests No results for input(s): TSH, T4TOTAL, T3FREE, THYROIDAB in the  last 72 hours.  Invalid input(s): FREET3  Other results:   Imaging    DG CHEST PORT 1 VIEW Result Date: 12/04/2024 CLINICAL DATA:  Ventilator dependence. EXAM: PORTABLE CHEST 1 VIEW COMPARISON:  12/03/2024 FINDINGS: Endotracheal tube tip is approximately 3.8 cm above the base of the carina. The NG tube passes into the stomach although the distal tip position is not included on the film. Right-sided Impella device is stable in position. A left IJ pulmonary artery catheter tip overlies expected location of the interlobar pulmonary artery. Right pleural drain again noted with persistent right pleural fluid. Basilar collapse/consolidation noted, IMPRESSION: 1. No substantial interval change. 2. Persistent right pleural effusion with right base collapse/consolidation. 3. Support apparatus as above. Electronically Signed   By: Camellia Candle M.D.   On: 12/04/2024 07:15   DG Abd 1 View Result Date: 12/04/2024 CLINICAL DATA:   Routine adult health maintenance Best images obtainable due to patient's condition EXAM: ABDOMEN - 1 VIEW COMPARISON:  12/04/2024 FINDINGS: NG tube tip is in the distal stomach. Diffuse gaseous distention of colon evident, similar to prior. IMPRESSION: NG tube tip is in the distal stomach. Electronically Signed   By: Camellia Candle M.D.   On: 12/04/2024 07:11   CT Angio Chest/Abd/Pel for Dissection W and/or W/WO Addendum Date: 12/03/2024 ADDENDUM #1 ADDENDUM: ---------------------------------------------------- These results were called to Dr. Daniel  at 10:15 pm on 12/03/2024. Electronically signed by: Franky Crease MD 12/03/2024 10:23 PM EST RP Workstation: [id number]   Result Date: 12/03/2024 ORIGINAL REPORT EXAM: CT CHEST, ABDOMEN AND PELVIS WITH AND WITHOUT CONTRAST 12/03/2024 09:52:07 PM TECHNIQUE: CT of the chest, abdomen and pelvis was performed with and without the administration of intravenous contrast. Multiplanar reformatted images are provided for review. Automated  exposure control, iterative reconstruction, and/or weight based adjustment of the mA/kV was utilized to reduce the radiation dose to as low as reasonably achievable. COMPARISON: None available. CLINICAL HISTORY: Acute aortic syndrome (AAS) suspected; Evaluate for intercostal bleeding or other sources after thoracentesis; also evaluate bowel. FINDINGS: CHEST: MEDIASTINUM AND LYMPH NODES: Mild cardiomegaly. Impella device tip in the left ventricle. Swan-ganz catheter tip in  the central right pulmonary artery. No evidence of aortic aneurysm or dissection. The central airways are clear. No mediastinal, hilar or axillary lymphadenopathy. LUNGS AND PLEURA: Right chest tube in place. Moderate to large right pleural effusion with compressive atelectasis in the right middle lobe and right lower lobe. There appears to be active extravasation of contrast posteriorly between the right 9th and 10th ribs and likely into the pleural space, possibly related to prior thoracentesis. The pleural fluid appears complex on the right, likely hemothorax. Small left pleural effusion with compressive atelectasis in the left lower lobe. Tiny right pneumothorax. Biapical subpleural blebs. No evidence of pulmonary embolus. ABDOMEN AND PELVIS: LIVER: Gas is seen branching peripherally in the liver compatible with portal venous gas. GALLBLADDER AND BILE DUCTS: Gallbladder is unremarkable. No biliary ductal dilatation. SPLEEN: No acute abnormality. PANCREAS: No acute abnormality. ADRENAL GLANDS: No acute abnormality. KIDNEYS, URETERS AND BLADDER: No stones in the kidneys or ureters. No hydronephrosis. No perinephric or periureteral stranding. Urinary bladder is unremarkable. GI AND BOWEL: Stomach and small bowel are decompressed. Diffuse gaseous distention of the colon suggests ileus. Moderate stool burden in the rectosigmoid colon. Pneumatosis noted in small bowel loops in the lower pelvis with air in the mesenteric veins feeding these small  bowel loops. Pneumatosis also noted in the right colon wall. REPRODUCTIVE ORGANS: No acute abnormality. PERITONEUM AND RETROPERITONEUM: No ascites. No free air. VASCULATURE: Aorta is normal in caliber. ABDOMINAL AND PELVIS LYMPH NODES: No lymphadenopathy. BONES AND SOFT TISSUES: No acute osseous abnormality. No focal soft tissue abnormality. IMPRESSION: 1. Active contrast extravasation between the right 9th and 10th ribs with likely extension into the pleural space, suspicious for ongoing intercostal bleeding likely related to recent thoracentesis. 2. Moderate to large right pleural effusion compatible with hemothorax with compressive atelectasis in the right middle and lower lobes; right chest tube present; tiny right pneumothorax. 3. No evidence of aortic aneurysm, aortic dissection, or pulmonary embolus. 4. Pneumatosis involving small bowel loops in the lower pelvis and the right colon with mesenteric venous gas and portal venous gas, concerning for bowel ischemia. Recommend surgical consultation. 5. Small left pleural effusion with compressive atelectasis in the left lower lobe. 6. . Attempts are being made to contact the ordering physician with the results. An addendum will be made at that time. Electronically signed by: Franky Crease MD 12/03/2024 10:09 PM EST RP Workstation: [id number]   DG Chest Port 1 View Result Date: 12/03/2024 EXAM: 1 VIEW(S) XRAY OF THE CHEST 12/03/2024 07:57:00 PM COMPARISON: Portable chest today at 7:02 pm. CLINICAL HISTORY: Encounter for chest tube placement. FINDINGS: LINES, TUBES AND DEVICES: A pigtail chest tube has been inserted on the right through the 6th intercostal space, with the pigtail in the lateral upper right thorax. Left IJ Swan-Ganz line again terminates in the distal right pulmonary artery. Stable positioning of right subclavian Impella device. LUNGS AND PLEURA: No pneumothorax. Moderate to large right pleural effusion is still present but decreased in the  interval. Small left pleural effusion unchanged. The right mid to lower lung is obscured by pleural fluid. There is patchy consolidation in the left lower lung field. Left mid and both apical lungs are clear. HEART AND MEDIASTINUM: Sternotomy sutures and two cardiac valve replacements are again shown. There is mild cardiomegaly and mild central vascular prominence as before. BONES AND SOFT TISSUES: No acute osseous abnormality. IMPRESSION: 1. Right pigtail chest tube in position with no measurable pneumothorax. 2. Moderate to large right pleural effusion, decreased compared to earlier  today. 3. Patchy consolidation in the left lower lung field. 4. Small left pleural effusion, unchanged. 5. Cardiomegaly . Electronically signed by: Francis Quam MD 12/03/2024 08:10 PM EST RP Workstation: [id number]   DG CHEST PORT 1 VIEW Result Date: 12/03/2024 EXAM: 1 VIEW(S) XRAY OF THE CHEST 12/03/2024 07:05:00 PM COMPARISON: None available. CLINICAL HISTORY: ABLA (acute blood loss anemia). FINDINGS: LINES, TUBES AND DEVICES: Impella device and Swan-Ganz catheter remain in place, unchanged. LUNGS AND PLEURA: Enlarging right pleural effusion, now large with atelectasis throughout the right lung. Vascular congestion and left basilar atelectasis. Low lung volumes. No pneumothorax. HEART AND MEDIASTINUM: No acute abnormality of the cardiac and mediastinal silhouettes. BONES AND SOFT TISSUES: No acute osseous abnormality. IMPRESSION: 1. Enlarging right pleural effusion, now large, with associated right lung atelectasis. 2. Vascular congestion with left basilar atelectasis. 3. Low lung volumes. Electronically signed by: Franky Crease MD 12/03/2024 07:10 PM EST RP Workstation: [id number]   DG Chest Port 1 View Result Date: 12/03/2024 CLINICAL DATA:  Pleural effusion, status post thoracentesis EXAM: PORTABLE CHEST 1 VIEW COMPARISON:  12/03/2024 FINDINGS: Single frontal view of the chest demonstrates stable left internal jugular flow  directed central venous catheter, tip overlying the right infrahilar region. Stable mitral and aortic valve prostheses. Cardiac silhouette remains enlarged. Lung volumes are diminished, with bibasilar veiling opacities, right greater than left, consistent with consolidation and effusions. No appreciable change since prior study. No evidence of pneumothorax. IMPRESSION: 1. Persistent bibasilar consolidation and effusions, right greater than left. 2. No evidence of pneumothorax. 3. Stable enlarged cardiac silhouette. Electronically Signed   By: Ozell Daring M.D.   On: 12/03/2024 16:10    Medications:   Scheduled Medications:  sodium chloride    Intravenous Once   arformoterol   15 mcg Nebulization BID   Chlorhexidine  Gluconate Cloth  6 each Topical Daily   folic acid   1 mg Intravenous Daily   insulin  aspart  0-24 Units Subcutaneous Q4H   lidocaine   1 patch Transdermal Q24H   mouth rinse  15 mL Mouth Rinse Q2H   pantoprazole  (PROTONIX ) IV  40 mg Intravenous Daily   revefenacin   175 mcg Nebulization Daily   sodium chloride  flush  3 mL Intravenous Q12H   thiamine  (VITAMIN B1) injection  100 mg Intravenous Daily    Infusions:  sodium chloride  Stopped (12/02/24 1110)   sodium chloride  10 mL/hr at 12/08/24 0600   amiodarone  30 mg/hr (12/08/24 0602)   fentaNYL  infusion INTRAVENOUS 150 mcg/hr (12/08/24 0600)   magnesium  sulfate bolus IVPB     milrinone  0.25 mcg/kg/min (12/08/24 0600)   norepinephrine  (LEVOPHED ) Adult infusion 4 mcg/min (12/08/24 0600)   piperacillin -tazobactam 3.375 g (12/08/24 0622)   potassium chloride  10 mEq (12/08/24 0622)   propofol  (DIPRIVAN ) infusion 35 mcg/kg/min (12/08/24 0600)   sodium bicarbonate  25 mEq (Impella PURGE) in dextrose  5 % 1000 mL bag     TPN ADULT (ION) 60 mL/hr at 12/08/24 0600   vancomycin  Stopped (12/07/24 2238)    PRN Medications: sodium chloride , sodium chloride , sodium chloride , albuterol , fentaNYL , fentaNYL  (SUBLIMAZE ) injection, hydrALAZINE ,  ondansetron  (ZOFRAN ) IV, mouth rinse, mouth rinse, oxyCODONE , sodium chloride , sodium chloride  flush  Patient Profile  43 y.o. male w/ h/o heavy alcohol use (at least 5 drinks per evening) and h/o asthma admitted w/ acute systolic heart failure. Strong family history of cardiac disease: mother and maternal grandmother had heart failure and father with CAD.    HFrEF. Severe valvular diease.  Assessment/Plan  1.  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  Valvular Disease  - severe MR posteriorly directed, mod TR. Likely functional.  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 Acute Systolic Heart Failure w/ Biventricular Dysfunction  - HS trop not c/w ACS but EKG w/ anteroseptal Qs  - CMRI Severe AR/MR. LVEF 35%  - Cath- normal cors, RA5, PA 32/17 (24), PVR 2.57 Papi 3. CO 4.9 CI 3.8 - Post-op Echo 12/02/24: EF < 20% with severe LVH (new, ?inflammatory), moderately decreased RV function, stable bioprosthetic MV and AoV.  - Fluid resuscitation with bleeding on 12/11 and Lasix  stopped.  - CVP 6/7 today, co-ox 88%?. - Will give lasix  60 IV x1 this morrning and reassess post OR.  - Today on milrinone  0.25 mcg and NE @4 .  - Impella @ P8 Flow 4.1.  No heparin  gtt with bleeding, HCO3 purge only.   - Hold digoxin  - Holding GDMT until back off pressors.   3. R hemothorax - Hemothorax s/p right thoracentesis with intercostal artery injury. Massive bleeding requiring multiple products and development of hemorrhagic shock, now improved. S/p IR embolization.  - Hgb 8.9>7.8 >9.2>7.5>>>10.2>9.7>10.3>10.5 - Transfuse hgb < 8.  - heparin  remains on hold.  - Chest tube in place, - OP.   4. Transaminates - Hepatitis panel negative. RUQ US  unremarkable  - suspect 2/2 CHF/hepatic congestion + chronic ETOH use    5.  ETOH Abuse - heavy drinker, at least 5 drinks per evening - may be contributing to CM,  reduction of intake imperative  - No evidence of withdrawal   6  Hypomagnesemia - follow; goal >2  - replete   7 . DMII  - Hgb A1C 6.1 - On SSI.  8. Ischemic bowel Mesenteric ischemia - CXR with dilated bowels.  - CT scan demonstrated active extravasation from a posterior intercostal artery. Also demonstrated portal venous gas and pneumatosis of the small and large bowel.  - S/p exp/lap 12/11. Patchy ischemia of the distal ileum extending over about 75 cm without perforation. Ischemic segment resected.  Colon was distended with gas but viable. Small bowel left in discontinuity.  - Back to OR 12/15 for ischemic R colon and mesenteric ischemia. S/p exp/lap, wound vac exchange, R colectomy without anastomosis.  - Back to the OR today for closure and ileostomy.   9. ID - Covering bowel pathogens with Zosyn .  - Tm 101.3, continue vancomycin .  - Tylenol  ordered  10. AKI - AKI with shock.  - Resolved  11. FEN - TPN  12. Rhythm - NSR today, has been in accelerated junctional rhythm as well.   13. VT - On amiodarone  gtt.   14. Pericardial effusion - There is a moderate effusion without tamponade, noted again by echo today.  Will need to follow over time.   CRITICAL CARE Performed by: Beckey LITTIE Coe   Total critical care time: 18 minutes  Critical care time was exclusive of separately billable procedures and treating other patients.  Critical care was necessary to treat or prevent imminent or life-threatening deterioration.  Critical care was time spent personally by me on the following activities: development of treatment plan with patient and/or surrogate as well as nursing, discussions with consultants, evaluation of patient's response to treatment, examination of patient, obtaining history from patient or surrogate, ordering and performing treatments and interventions, ordering and review of laboratory studies, ordering and review of radiographic studies, pulse oximetry and  re-evaluation of  patient's condition.   Length of Stay: 15  Beckey LITTIE Coe, NP  12/08/2024, 7:14 AM  Advanced Heart Failure Team Pager [phone] (M-F; 7a - 5p)  Please contact [facility] for night-coverage after hours (5p -7a ) and weekends on [email or web address]  Patient seen with NP, I formulated the plan and agree with the above note.   This morning, he is on milrinone  0.25 and NE 2.  Impella P8 flow 4.2 L/min.  Plts 56 => 61, LDH mildly higher at 444.  No heparin  gtt, have HCO3 purge only.   Lasix  60 mg IV bid yesterday, creatinine stable 0.8 with I/Os mildly negative net 550 cc.  CVP 13 on my read this morning.  CI 3.5 off Swan.   General: NAD Neck: JVP 12-14 cm, no thyromegaly or thyroid nodule.  Lungs: Decreased at bases.  CV: Nondisplaced PMI.  Heart regular S1/S2, no S3/S4, no murmur.  No peripheral edema.   Abdomen: Wound vac Skin: Intact without lesions or rashes.  Neurologic: Alert and oriented x 3.  Psych: Normal affect. Extremities: No clubbing or cyanosis.  HEENT: Normal.   Stable hemodynamics today, Impella 5.5 P8 with flow 4.2 and CI 3.5.  Continue milrinone  0.25, weaning down on NE (currently at 2).  Plts stable with LDH higher, will assess Impella position via echo today => echo done, Impella position looks good with tip at 5 cm.  RV systolic function appears to have improved, now mildly hypokinetic.  There is a moderate pericardial effusion without tamponade, does look slightly larger than last echo.     CVP 13.  Lasix  80 mg IV bid today, replace K.    On vancomycin /Zosyn  with fever to 100.2, has open abdomen. Plan to return to OR today for ileostomy and closure.   CRITICAL CARE Performed by: Ezra Shuck  Total critical care time: 45 minutes  Critical care time was exclusive of separately billable procedures and treating other patients.  Critical care was necessary to treat or prevent imminent or life-threatening deterioration.  Critical care was time spent  personally by me on the following activities: development of treatment plan with patient and/or surrogate as well as nursing, discussions with consultants, evaluation of patient's response to treatment, examination of patient, obtaining history from patient or surrogate, ordering and performing treatments and interventions, ordering and review of laboratory studies, ordering and review of radiographic studies, pulse oximetry and re-evaluation of patient's condition.  Ezra Shuck 12/08/2024 9:25 AM

## 2024-12-08 NOTE — Progress Notes (Signed)
 NAME:  Alejandro Lopez, MRN:  [id number], DOB:  10-17-1981, LOS: 15 ADMISSION DATE:  11/22/2024, CONSULTATION DATE:  12/8 REFERRING MD:  Lucas, CHIEF COMPLAINT:  post cardiac surgery critical care services    History of Present Illness:  43 year old male patient with history of hypertension, alcohol and tobacco abuse, presented to the emergency room on 11/30 with approximately 1 month history of progressive dyspnea accompanied by lower extremity swelling extending up to the level of his scrotum and abdomen. Diagnostic evaluation by echocardiogram showed left ventricular ejection fraction 35 to 40% with grade 3 diastolic dysfunction this was further complicated by severe mitral valve regurgitation and aortic insufficiency because of this he was transferred to [facility] for further evaluation. He underwent cardiac catheterization on 12/4: Right heart hemodynamic parameters showed baseline right atrial pressure 5 mmHg, PA pressure 32/17, pulmonary capillary wedge pressure at 14 estimated Fick 4.9 L/min with cardiac index Fick calculated at 2.59 His PAPi was 3 Left heart cath was negative for coronary artery disease Went to OR 12/8 for MVR and AVR w/ impella insertion. PCCM asked to assist w/ post op care   OR course EBL: 1735 Received  Products: cryo 92ml, FFP 401 Also received DDAVP , 2200 crystalloid, 250ml albumin   Cell saver 1125 Pump time 4hrs Clamp time 2hrs 50 min  Events: multiple defibrillations intra-op  Intra-op ECHO EF estimated 40%  Pertinent  Medical History  Tobacco abuse, alcohol abuse, hypertension  Significant Hospital Events: Including procedures, antibiotic start and stop dates in addition to other pertinent events   11/30 admitted/. ECHO 35 to 40% with grade 3 diastolic dysfunction this was further complicated by severe mitral valve regurgitation and aortic insufficiency 12/4 left and right heart cath 12/8 AVR and MVR w/ bioprosthetic valves. Arrived on icu  w/ Impella 5.5 MCS at flow 4 lpm and P 7. Received 2 more PLTs, 2 FFP and 1 cryo in first 6 hrs post op for cont blood loss/oozing. Required NE, epi and milrinone . Good flow on IMPELLA but minimal pulsatility  at P7. Placed on Amio gtt for VT 12/9 received 1 unit PRBC over night for hgb down to 7.5, hgb 8 getting second unit of blood. Still on P7 3.5 lPM. Extubated. 12/11 1L thora, later hemorrhagic shock and hemothorax> IR embolization. Also found to have ischemic bowel and underwent ex-lap with partial resection of ileum. 12/15 plan for ostomy tomorrow, transition epi to levophed   12/16 plan back to OR for ostomy placement   Interim History / Subjective:  No overnight events  Remains on Norepi  Milrinone  0.25 and amiodarone  30mg  CVP 10 CO 7.1 Impella at P8  Objective    Blood pressure 98/75, pulse 98, temperature 100.2 F (37.9 C), resp. rate (!) 22, height 5' 10 (1.778 m), weight 81.8 kg, SpO2 100%. PAP: (47-63)/(20-34) 56/24 CVP:  [6 mmHg-21 mmHg] 8 mmHg CO:  [4.6 L/min-7.5 L/min] 6.6 L/min CI:  [2.5 L/min/m2-4 L/min/m2] 3.5 L/min/m2  Vent Mode: PRVC FiO2 (%):  [40 %] 40 % Set Rate:  [22 bmp] 22 bmp Vt Set:  [580 mL] 580 mL PEEP:  [5 cmH20] 5 cmH20 Plateau Pressure:  [19 cmH20-24 cmH20] 20 cmH20   Intake/Output Summary (Last 24 hours) at 12/08/2024 0816 Last data filed at 12/08/2024 0600 Gross per 24 hour  Intake 4348.55 ml  Output 5620 ml  Net -1271.45 ml   Filed Weights   12/06/24 0500 12/07/24 0500 12/08/24 0500  Weight: 81.3 kg 82.9 kg 81.8 kg  General: Critically ill male, intubated and sedated HEENT: MM pink/moist, sclera anicteric, ETT in place  Neuro: opening eyes, remains on sedation prior to OR CV: s1s2 tachycardic, no m/r/g, Impella on P8 PULM:  mechanically ventilated, synchronous with vent, diminished in the bilateral bases without significant rhonchi or wheezing GI: soft, discontinuity at the midline, wound VAC with minimal output Extremities:  warm/dry, 1+ edema     Chest tube output: 30cc 4.7L urine output yesterday, net -1.4  CXR with stable pleural effusion   Resolved problem list   AKI Lactic acidosis, resolved Constipation Hyponatremia  Assessment and Plan   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 VT; frequent ectopy -con't amiodarone  infusion -Norepi requirement down-trending  -con't milrinone  0.53mcg -impella + bicarb purge - Continue holding AC for now, heparin  sq after the OR today -GDMT limited while in shock -Lasix  80mg  bid  -eventually needs warfarin for 3 months; to be started after impella -con't swan  Fevers, distributive shock  -Continue Vanco and Zosyn , likely de-escalate once abdomen closed, cultures remain negative  -trach aspirate culture -can't pull any lines/ tubes at this point   R pleural effusion> hemothorax post effusion, now with retained blood and chest tube in place.  -con't chest tube to suction, flush to prevent clotting -No significant bleeding, will likely need VATS to clean out at the time the impella is removed     Ischemic bowel with pneumatosis; s/p partial ileum resection.  Currently in discontinuity. Last went to the OR 12/14 for reexploration of the abdomen, found necrotic portions of the distal ileum and cecum along with the anterior lateral ascending colon. Underwent R colectomy without anastomosis -OR for ostomy today will request on the right in the event patient is an LVAD candidate -con't TPN & PICC; keeping lipids out of TPN until extubated -monitor wound vac output - continue broad-spectrum antibiotics  Post-op vent management -Maintain full vent support with SAT/SBT as tolerated -titrate Vent setting to maintain SpO2 greater than or equal to 90%. -HOB elevated 30 degrees. -Plateau pressures less than 30 cm  H20.  -Follow chest x-ray, ABG prn.   -Bronchial hygiene and RT/bronchodilator protocol.  Hyperglycemia; h/o pre-DM. A1c 6.1 -SSI PRN -goal BG 140-180 -OP follow up with PCP for prediabetes   H/o tobacco abuse H/o asthma vs COPD -Quitting smoking recommended; have counseled him on this previously -nebs while on vent -pulmonary hygiene   ETOH abuse -wants to quit, address when appropriate  -TPN   Acute blood loss anemia, thrombocytopenia, coagulopathy due to hemorrhagic shock, consumption with impella -transfuse for Hb <7 or hemodynamically significant bleeding -monitor coags -per TCTS high threshold for platelet transfusion unless bleeding     DVT: holding chemical DVT prophylaxis until after the OR  Needs foley, lines, chest tube, impella     Critical care time:  32 minutes      CRITICAL CARE Performed by: Leita SAUNDERS Maddeline Roorda   Total critical care time:  32 minutes  Critical care time was exclusive of separately billable procedures and treating other patients.  Critical care was necessary to treat or prevent imminent or life-threatening deterioration.  Critical care was time spent personally by me on the following activities: development of treatment plan with patient and/or surrogate as well as nursing, discussions with consultants, evaluation of patient's response to treatment, examination of patient, obtaining history from patient or surrogate, ordering  and performing treatments and interventions, ordering and review of laboratory studies, ordering and review of radiographic studies, pulse oximetry and re-evaluation of patient's condition.   Leita SAUNDERS Oriah Leinweber, PA-C [facility] See Amion for pager If no response to pager , please call [phone] until 7pm After 7:00 pm call Elink  [phone]

## 2024-12-08 NOTE — Progress Notes (Signed)
 12/08/2024 PM rounds. Appreciate Dr. Willey help. Closed and end ileostomy made. Ostomy looks okay. Pigtail output starting to pick up. Check BMP, CBC Start heparin  subQ Let rest this evening, start vent and potentially impella weaning tomorrow; if we run into issues may need to revisit addressing lingering pleural clot and pericardial effusion.  Rolan Sharps MD PCCM

## 2024-12-08 NOTE — Transfer of Care (Signed)
 Immediate Anesthesia Transfer of Care Note  Patient: Alejandro Lopez  Procedure(s) Performed: LAPAROTOMY, EXPLORATORY (Abdomen) CREATION, ILEOSTOMY END (Abdomen) EXCISION, SMALL INTESTINE (Abdomen)  Patient Location: ICU   Anesthesia Type:General  Level of Consciousness: sedated and Patient remains intubated per anesthesia plan  Airway & Oxygen  Therapy: Patient remains intubated per anesthesia plan and Patient placed on Ventilator (see vital sign flow sheet for setting)  Post-op Assessment: Report given to RN and Post -op Vital signs reviewed and stable  Post vital signs: Reviewed and stable  Last Vitals:  Vitals Value Taken Time  BP    Temp    Pulse 99 12/08/24 14:44  Resp 21 12/08/24 14:44  SpO2 96 % 12/08/24 14:44  Vitals shown include unfiled device data.  Last Pain:  Vitals:   12/08/24 0800  TempSrc: Core  PainSc:       Patients Stated Pain Goal: 0 (12/03/24 0400)  Complications: No notable events documented.

## 2024-12-08 NOTE — Plan of Care (Signed)
   Problem: Clinical Measurements: Goal: Will remain free from infection Outcome: Progressing Goal: Diagnostic test results will improve Outcome: Progressing

## 2024-12-08 NOTE — Progress Notes (Signed)
 2 Days Post-Op Procedures (LRB): LAPAROTOMY, EXPLORATORY; WOUND VAC CHANGE (N/A) COLECTOMY, RIGHT (Right) Subjective: No events overnight Epi off, levo at 4, milrinone  0.25 Hgb remains stable Impella flowing 4.2 L  Objective: Vital signs in last 24 hours: Temp:  [98.8 F (37.1 C)-101.5 F (38.6 C)] 100.2 F (37.9 C) (12/16 0645) Pulse Rate:  [89-109] 98 (12/16 0645) Cardiac Rhythm: Normal sinus rhythm (12/16 0400) Resp:  [8-28] 22 (12/16 0645) BP: (88-152)/(65-130) 98/75 (12/16 0400) SpO2:  [96 %-100 %] 100 % (12/16 0645) Arterial Line BP: (90-137)/(45-83) 122/76 (12/16 0645) FiO2 (%):  [40 %] 40 % (12/16 0400) Weight:  [81.8 kg] 81.8 kg (12/16 0500)  Hemodynamic parameters for last 24 hours: PAP: (47-65)/(20-34) 56/24 CVP:  [6 mmHg-21 mmHg] 8 mmHg CO:  [4.6 L/min-7.7 L/min] 6.6 L/min CI:  [2.5 L/min/m2-4.1 L/min/m2] 3.5 L/min/m2  Intake/Output from previous day: 12/15 0701 - 12/16 0700 In: 4662.7 [I.V.:2878.4; IV Piggyback:1473.5] Out: 6160 [Urine:4795; Emesis/NG output:345; Drains:700; Chest Tube:320] Intake/Output this shift: No intake/output data recorded.  General appearance: intubated, sedated Heart: tachy, regular Lungs: rhonchi bilaterally Abdomen: VAC in place Extremities: edema @= Wound: sternal intact  Lab Results: Recent Labs    12/07/24 0309 12/07/24 0456 12/08/24 0410 12/08/24 0423  WBC 14.1*  --  12.1*  --   HGB 10.3*   < > 10.5* 14.3  HCT 29.5*   < > 30.6* 42.0  PLT 56*  --  61*  --    < > = values in this interval not displayed.   BMET:  Recent Labs    12/07/24 1509 12/08/24 0410 12/08/24 0423  NA 140 143 142  K 3.3* 3.3* 3.2*  CL 105 104  --   CO2 27 27  --   GLUCOSE 117* 112*  --   BUN 24* 28*  --   CREATININE 0.75 0.80  --   CALCIUM  7.8* 7.9*  --     PT/INR:  Recent Labs    12/06/24 0352  LABPROT 15.6*  INR 1.2   ABG    Component Value Date/Time   PHART 7.373 12/08/2024 0423   HCO3 28.6 (H) 12/08/2024 0423   TCO2  30 12/08/2024 0423   ACIDBASEDEF 1.0 12/06/2024 1432   O2SAT 88.8 12/08/2024 0517   CBG (last 3)  Recent Labs    12/07/24 1934 12/07/24 2345 12/08/24 0352  GLUCAP 97 110* 118*    Assessment/Plan: S/P Procedures (LRB): LAPAROTOMY, EXPLORATORY; WOUND VAC CHANGE (N/A) COLECTOMY, RIGHT (Right) POD # 8/2 NEURO- sedated CV- in sinus tachy  Impella p8, 4.2 L/min  Hemodynamic good on milrinone  and levo RESP- CXR stable  VDRF- vent per CCM RENAL- cre and lytes stable, responding well to diurestics (60IV x 2 yesterday) ENDO- CBG mildly elevated GI - Open abdomen, s/p small bowel and right colon resection  On TPN  OR today with gen surg for ileostomy and possible closure ID- febrile, continue vanc/zosyn  Anemia Hgb stable  Thrombocytopenia- plt 61 from 56, will  consider HSQ after gen surg Remains critically ill   LOS: 15 days    Alejandro Lopez 12/08/2024

## 2024-12-08 NOTE — Progress Notes (Addendum)
 PHARMACY - TOTAL PARENTERAL NUTRITION CONSULT NOTE   Indication: massive bowel resection   Patient Measurements: Height: 5' 10 (177.8 cm) Weight: 81.8 kg (180 lb 5.4 oz) IBW/kg (Calculated) : 73 TPN AdjBW (KG): 69.9 Body mass index is 25.88 kg/m. Usual Weight: weight 79kg on admission, unclear baseline.   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 Patient underwent an aortic and mitral valve replacement on 12/8 with insertion of Impella. Post-op was found to have continuing HgB drop with noted abdominal pain. Was found to have hemorrhage from intercostal, chest tube and embolization was completed. Workup for dropping HgB included CT angio of chest/ab/and pelvis showed pneumatosis and concern for bowel ischemia. Patient went to OR on 12/12 for ex-lap found to have ischemia of distal ileum over 75cm without perforation. Patient was left in discontinuity with plans to return to OR.  Patient with diet throughout admission however noted nausea and abdominal pain during peri-op time period. Pharmacy consulted to manage TPN.   Glucose / Insulin : no hx DM, A1c 6.1% - CBGs < 180 Used 4 units SSI in the past 24 hrs Electrolytes: Na/CL wnl 141/104 increasing with diuresis, CoCa 9.9  (iCa wnl at 1.16), Phos up 3.3 (received 15 mmol yesterday outside of TPN) K down 3.2 (received 40 meq yesterday outside of TPN), mg down 1.6 (received 2g IV yesterday outside of TPN) Renal: AKI resolved - SCr down < 1 (BL SCr ~ 0.7), BUN 24. Received yesterday Lasix  60 mg x 2. Today will receive lasix  80 mg x 2.  Hepatic: AST/ALT mildly elevated, tbili stable at 3.9 (no jaundice), alk phos / TG WNL, albumin  1.5 *elevation not due to TPN Intake / Output; MIVF: UOP 2.4 ml/kg/hr, NG , drain , chest tube up to 320 mL, LBM 12/16, net -7.5L (improving) GI Imaging: 12/11 CT: pneumatosis of small bowel with  gas, concerning for bowel ischemia  GI Surgeries / Procedures:  12/11 ex-lap with distal ileum with ischemia (75cm), left in discontinuity with plans to return to OR on Sunday to evaluate for further ischemia   12/14 ex lap, placement of wound vac, right colectomy due to necrosis, abdomen left open  12/16 tentative return to OR for closure and ileostomy   Central access: PICC placed 12/04/24 TPN start date: 12/04/24  Nutritional Goals:  Goal concentrated TPN rate without lipids is 57mL/hr (90g/L AA and 22% CHO provides 130g AA, 317g CHO, and 1596 kCal per day)  Goal concentrated TPN rate with lipids is 70 mL/hr (78g/L AA, 38g/L ILE, and 16.5% CHO will provide 130g AA, 274g CHO, 64g ILE and 2107 kcals per day)  RD Estimated Needs Total Energy Estimated Needs: 2100-2300 kcals Total Protein Estimated Needs: 125-150 g Total Fluid Estimated Needs: 1.8 L  Current Nutrition:  TPN Propofol  15.9 ml/hr = 422 kCal per day  Plan:  Concentrate TPN as fluid resuscitation is completed receiving diuresis  TPN without lipids at goal of 60 ml/hr will provide 130g AA, 317g CHO and 1596 kCal. kCal with Propofol  will be 2018 per day. TPN and Propofol  will meet >95% of needs Electrolytes in TPN: Decrease Na at 75 mEq/L, incr K 50mEq/L, incr Ca 78mEq/L, incr Mg 44mEq/L, Phos 25mmol/L, max CL Add standard MVI and trace elements to TPN Decrease from CVTS to mSSI Q4H per discussion with team Monitor TPN labs on Mon/Thurs - labs in AM F/u with transitioning off Propofol   as able   Give KCL IV x 7 runs per primary team Give magnesium  sulfate 4 g IV    Sakshi Sermons, PharmD

## 2024-12-08 NOTE — Progress Notes (Signed)
° °  [address] [place] [address]             [phone]    S/p small bowel resection, ileostomy  Intubated, sedated  BP 99/83   Pulse 95   Temp 99.1 F (37.3 C)   Resp (!) 22   Ht 5' 10 (1.778 m)   Wt 81.8 kg   SpO2 99%   BMI 25.88 kg/m  Milrinone  0.25 Norepi 6 Impella p8, 4.0 L/min   Intake/Output Summary (Last 24 hours) at 12/08/2024 1727 Last data filed at 12/08/2024 1700 Gross per 24 hour  Intake 4224.31 ml  Output 7300 ml  Net -3075.69 ml    Continue current care  Lurline Caver C. Kerrin, MD [facility] [phone]

## 2024-12-08 NOTE — Anesthesia Preprocedure Evaluation (Addendum)
 Anesthesia Evaluation  Patient identified by MRN, date of birth, ID band  Reviewed: Allergy & Precautions, NPO status , Patient's Chart, lab work & pertinent test results  History of Anesthesia Complications Negative for: history of anesthetic complications  Airway        Dental   Pulmonary asthma , Current Smoker and Patient abstained from smoking. Intubated; Hemothorax s/p IR embolization (12/11)          Cardiovascular hypertension, + Peripheral Vascular Disease and +CHF (s/p Intraoperative Impella 5.5)  + Valvular Problems/Murmurs (s/p AVR and MVR (11/30/24))   TTE Limited (12/08/24): 1. Bioprosthetic mitral valve replacement, without Doppler assessment.  2. Limited study for Impella cannula position.  3. Moderate to large circumferential pericardial effusion, with fibrinous  material.  4. 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 Global left ventricular  hypokinesis, paradoxical septal motion, LVEF approximately 30%. There is a  catheter/device lead in the right ventricle.      Neuro/Psych    GI/Hepatic Ischemic Bowel s/p Resection with Open Abdomen   Endo/Other    Renal/GU Renal InsufficiencyRenal disease     Musculoskeletal   Abdominal   Peds  Hematology  (+) Blood dyscrasia Hgb 14.3, Plts 61K (12/08/24)   Anesthesia Other Findings   Reproductive/Obstetrics                              Anesthesia Physical Anesthesia Plan  ASA: 5  Anesthesia Plan: General   Post-op Pain Management:    Induction: Intravenous  PONV Risk Score and Plan: 1 and Treatment may vary due to age or medical condition  Airway Management Planned: Oral ETT  Additional Equipment: Arterial line and CVP  Intra-op Plan:   Post-operative Plan: Post-operative intubation/ventilation  Informed Consent: I have reviewed the patients History and Physical, chart, labs and discussed the procedure including the  risks, benefits and alternatives for the proposed anesthesia with the patient or authorized representative who has indicated his/her understanding and acceptance.     Dental advisory given  Plan Discussed with: CRNA and Surgeon  Anesthesia Plan Comments: (Increasing fibrinous pericardial effusion (now moderate-to-large). Discussed with heart failure team and Dr. Rolan. Plan to continue to watch per Cardiology recommendation. Plan relayed to OR staff. )        Anesthesia Quick Evaluation

## 2024-12-08 NOTE — Op Note (Signed)
 Preoperative diagnosis: Open abdomen status post small bowel resection and right colectomy for mesenteric ischemia Postoperative diagnosis: Same as above Procedure: 1.  Reopening of recent laparotomy 2.  Small bowel resection 3.  End ileostomy Surgeon: Dr. Adina Bury Assistant: Dr. Marolyn Big Estimated blood loss: Less than 50 cc Specimens: Small bowel to pathology Anesthesia: General Complications: None Drains: None Sponge needle count was correct at completion Disposition to ICU in critical condition  Indications: This is a 43 year old male status post aortic and mitral valve replacement with an Impella on December 8.  He was noted on December 11 to have a CT scan that showed pneumatosis of his small bowel and right colon.  He was taken to the operating room where he underwent laparotomy with small bowel resection and placement of a VAC.  He was returned 2 days later for reexploration and underwent a right colectomy.  He remains quite ill in the ICU.  We discussed returning to the operating room today.  Procedure: After informed consent was obtained from his wife he was taken to the operating room.  He was already on antibiotics.  He was placed under general anesthesia without complication.  His VAC was removed.  His pacing wires were appropriately draped off from the field.  The Impella was moved to the top of the bed.  He was then prepped and draped in a standard sterile surgical fashion.  A surgical timeout was then performed.  I remove the inner layer of the VAC.  There was no succus or any blood in his abdomen.  I then ran his small bowel from his ligament of Treitz all the way to the stapled off section.  This was all viable.  I did remove the very end of this just because it was a small amount dusky.  This was done with a GIA stapler and then I oversewed the mesentery.  I also reviewed the entire colon and this was healthy.  Due to this I elected to close him today.  I did not  want to do an anastomosis given his overall health.  I brought his ileum up through a hole that I made on the right side of his abdomen.  This was big enough to accommodate 2 fingers.  This was brought up without any tension and the mesentery was in good position.  I then closed his abdomen with #1 looped PDS.  Eventually a Betadine soaked Kerlix was placed there.  The ostomy was then matured with 3-0 Vicryl in an appliance placed.  He tolerated this and was transferred back to the ICU.

## 2024-12-08 NOTE — Progress Notes (Signed)
 Subjective/Chief Complaint: Intubated, ill   Objective: Vital signs in last 24 hours: Temp:  [98.8 F (37.1 C)-101.5 F (38.6 C)] 100.2 F (37.9 C) (12/16 0645) Pulse Rate:  [89-109] 98 (12/16 0645) Resp:  [8-28] 22 (12/16 0645) BP: (88-152)/(65-130) 98/75 (12/16 0400) SpO2:  [96 %-100 %] 100 % (12/16 0645) Arterial Line BP: (90-137)/(45-83) 122/76 (12/16 0645) FiO2 (%):  [40 %] 40 % (12/16 0400) Weight:  [81.8 kg] 81.8 kg (12/16 0500) Last BM Date : 12/07/24  Intake/Output from previous day: 12/15 0701 - 12/16 0700 In: 4662.7 [I.V.:2878.4; IV Piggyback:1473.5] Out: 6160 [Urine:4795; Emesis/NG output:345; Drains:700; Chest Tube:320] Intake/Output this shift: No intake/output data recorded.  Ab soft vac in place with serosang output  Lab Results:  Recent Labs    12/07/24 0309 12/07/24 0456 12/08/24 0410 12/08/24 0423  WBC 14.1*  --  12.1*  --   HGB 10.3*   < > 10.5* 14.3  HCT 29.5*   < > 30.6* 42.0  PLT 56*  --  61*  --    < > = values in this interval not displayed.   BMET Recent Labs    12/07/24 1509 12/08/24 0410 12/08/24 0423  NA 140 143 142  K 3.3* 3.3* 3.2*  CL 105 104  --   CO2 27 27  --   GLUCOSE 117* 112*  --   BUN 24* 28*  --   CREATININE 0.75 0.80  --   CALCIUM  7.8* 7.9*  --    PT/INR Recent Labs    12/06/24 0352  LABPROT 15.6*  INR 1.2   ABG Recent Labs    12/07/24 1220 12/08/24 0423  PHART 7.350 7.373  HCO3 26.6 28.6*    Studies/Results: ECHOCARDIOGRAM LIMITED Result Date: 12/07/2024    ECHOCARDIOGRAM LIMITED REPORT   Patient Name:   Alejandro Lopez Date of Exam: 12/07/2024 Medical Rec #:  [id number]        Height:       70.0 in Accession #:    [id number]       Weight:       182.8 lb Date of Birth:  Jan 18, 1981       BSA:          2.009 m Patient Age:    43 years         BP:           100/78 mmHg Patient Gender: M                HR:           105 bpm. Exam Location:  Inpatient Procedure: Limited Echo (Both Spectral and Color  Flow Doppler were utilized            during procedure). Indications:    Impella check  History:        Patient has prior history of Echocardiogram examinations, most                 recent 12/04/2024.  Sonographer:    Tinnie Gosling RDCS Referring Phys: [phone] DALTON S MCLEAN IMPRESSIONS  1. Impella 5.5 tip at 4.6 cm from AV.  2. Lead in RV.  3. Right atrial size was Lead in RA.  4. Moderate pericardium effusion (max 1.3 cm) with fibrinous strand. Moderate pericardial effusion. Comparison(s): Changes from prior study are noted. 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 Compared to prior echo, Impella tip is less deep into LV and the pericardial effusion is more significant. FINDINGS  Left Ventricle: Impella 5.5 tip at 4.6 cm from AV. Right Ventricle: Lead in RV. Right Atrium: Right atrial size was Lead in RA. Pericardium: Moderate pericardium effusion (max 1.3 cm) with fibrinous strand. A moderately sized pericardial effusion is present. Joelle Cedars Tonleu Electronically signed by Joelle Cedars Ny Signature Date/Time: 12/07/2024/9:57:48 AM    Final    DG Chest Port 1 View Result Date: 12/07/2024 EXAM: 1 VIEW(S) XRAY OF THE CHEST 12/07/2024 05:17:00 AM COMPARISON: Portable chest from yesterday at 6:42 am. CLINICAL HISTORY: S/P AVR. FINDINGS: LINES, TUBES AND DEVICES: NGT is coiled in the stomach. Tip of the ETT is 4 cm from the carina. There is stable positioning of the left IJ Swan-Ganz line, right PICC, and a right subclavian Impella device. LUNGS AND PLEURA: Perihilar vascular congestion and interstitial edema are improved. No significant changes seen in bilateral airspace disease. Moderate sized right pleural effusion with pigtail chest catheter in place. No measurable pneumothorax. HEART AND MEDIASTINUM: Cardiomegaly is stable with sternotomy sutures in place, mitral and aortic valve replacements. BONES AND SOFT TISSUES: No acute osseous abnormality. IMPRESSION: 1. Improved perihilar vascular congestion and  interstitial edema. 2. Stable moderate-sized right pleural effusion with pigtail chest catheter in place. No measurable pneumothorax. 3. Stable cardiomegaly with sternotomy sutures in place and mitral and aortic valve replacements. 4. No improvement in bilateral airspace disease. Electronically signed by: Francis Quam MD 12/07/2024 06:46 AM EST RP Workstation: [id number]    Anti-infectives: Anti-infectives (From admission, onward)    Start     Dose/Rate Route Frequency Ordered Stop   12/06/24 2200  vancomycin  (VANCOREADY) IVPB 1250 mg/250 mL        1,250 mg 166.7 mL/hr over 90 Minutes Intravenous Every 12 hours 12/06/24 0811     12/06/24 0900  vancomycin  (VANCOREADY) IVPB 1750 mg/350 mL        1,750 mg 175 mL/hr over 120 Minutes Intravenous  Once 12/06/24 0802 12/06/24 1032   12/04/24 1100  vancomycin  (VANCOCIN ) IVPB 1000 mg/200 mL premix  Status:  Discontinued        1,000 mg 200 mL/hr over 60 Minutes Intravenous Every 12 hours 12/03/24 2238 12/03/24 2242   12/04/24 0000  vancomycin  (VANCOREADY) IVPB 1500 mg/300 mL  Status:  Discontinued        1,500 mg 150 mL/hr over 120 Minutes Intravenous  Once 12/03/24 2236 12/03/24 2242   12/03/24 2300  piperacillin -tazobactam (ZOSYN ) IVPB 3.375 g        3.375 g 12.5 mL/hr over 240 Minutes Intravenous Every 8 hours 12/03/24 2236     11/30/24 2130  vancomycin  (VANCOCIN ) IVPB 1000 mg/200 mL premix        1,000 mg 200 mL/hr over 60 Minutes Intravenous  Once 11/30/24 1609 11/30/24 2351   11/30/24 1615  ceFAZolin  (ANCEF ) IVPB 2g/100 mL premix        2 g 200 mL/hr over 30 Minutes Intravenous Every 8 hours 11/30/24 1609 12/02/24 0546   11/30/24 0400  vancomycin  (VANCOREADY) IVPB 1250 mg/250 mL        1,250 mg 166.7 mL/hr over 90 Minutes Intravenous To Surgery 11/29/24 1040 11/30/24 0856   11/30/24 0400  ceFAZolin  (ANCEF ) IVPB 2g/100 mL premix        2 g 200 mL/hr over 30 Minutes Intravenous To Surgery 11/29/24 1040 11/30/24 1239   11/30/24 0400   ceFAZolin  (ANCEF ) IVPB 2g/100 mL premix  Status:  Discontinued        2 g 200 mL/hr over 30 Minutes Intravenous To  Surgery 11/29/24 1036 11/30/24 1609       Assessment/Plan: POD 4/2 - status post ex lap with SBR & VAC placement, repeat laparotomy with patchy small bowel ischemia - VAC in place -  return to OR today for likely closure and ileostomy  Alejandro Lopez 12/08/2024

## 2024-12-09 ENCOUNTER — Inpatient Hospital Stay (HOSPITAL_COMMUNITY)

## 2024-12-09 ENCOUNTER — Encounter (HOSPITAL_COMMUNITY): Payer: Self-pay | Admitting: General Surgery

## 2024-12-09 DIAGNOSIS — I110 Hypertensive heart disease with heart failure: Secondary | ICD-10-CM | POA: Diagnosis not present

## 2024-12-09 DIAGNOSIS — Z95811 Presence of heart assist device: Secondary | ICD-10-CM

## 2024-12-09 DIAGNOSIS — J9691 Respiratory failure, unspecified with hypoxia: Secondary | ICD-10-CM

## 2024-12-09 DIAGNOSIS — I5082 Biventricular heart failure: Secondary | ICD-10-CM | POA: Diagnosis not present

## 2024-12-09 DIAGNOSIS — I5021 Acute systolic (congestive) heart failure: Secondary | ICD-10-CM | POA: Diagnosis not present

## 2024-12-09 DIAGNOSIS — R609 Edema, unspecified: Secondary | ICD-10-CM | POA: Diagnosis not present

## 2024-12-09 DIAGNOSIS — R570 Cardiogenic shock: Secondary | ICD-10-CM | POA: Diagnosis not present

## 2024-12-09 LAB — ECHOCARDIOGRAM LIMITED
Height: 70 in
Weight: 2874.8 [oz_av]

## 2024-12-09 LAB — POCT I-STAT 7, (LYTES, BLD GAS, ICA,H+H)
Acid-Base Excess: 4 mmol/L — ABNORMAL HIGH (ref 0.0–2.0)
Bicarbonate: 29.5 mmol/L — ABNORMAL HIGH (ref 20.0–28.0)
Calcium, Ion: 1.13 mmol/L — ABNORMAL LOW (ref 1.15–1.40)
HCT: 32 % — ABNORMAL LOW (ref 39.0–52.0)
Hemoglobin: 10.9 g/dL — ABNORMAL LOW (ref 13.0–17.0)
O2 Saturation: 99 %
Patient temperature: 38.9
Potassium: 3.6 mmol/L (ref 3.5–5.1)
Sodium: 144 mmol/L (ref 135–145)
TCO2: 31 mmol/L (ref 22–32)
pCO2 arterial: 48.9 mmHg — ABNORMAL HIGH (ref 32–48)
pH, Arterial: 7.396 (ref 7.35–7.45)
pO2, Arterial: 141 mmHg — ABNORMAL HIGH (ref 83–108)

## 2024-12-09 LAB — RENAL FUNCTION PANEL
Albumin: 2.1 g/dL — ABNORMAL LOW (ref 3.5–5.0)
Anion gap: 9 (ref 5–15)
BUN: 31 mg/dL — ABNORMAL HIGH (ref 6–20)
CO2: 29 mmol/L (ref 22–32)
Calcium: 8.5 mg/dL — ABNORMAL LOW (ref 8.9–10.3)
Chloride: 108 mmol/L (ref 98–111)
Creatinine, Ser: 0.93 mg/dL (ref 0.61–1.24)
GFR, Estimated: >60 mL/min (ref >60)
Glucose, Bld: 125 mg/dL — ABNORMAL HIGH (ref 70–99)
Phosphorus: 3.5 mg/dL (ref 2.5–4.6)
Potassium: 3.8 mmol/L (ref 3.5–5.1)
Sodium: 146 mmol/L — ABNORMAL HIGH (ref 135–145)

## 2024-12-09 LAB — CBC
HCT: 32.4 % — ABNORMAL LOW (ref 39.0–52.0)
HCT: 30.3 % — ABNORMAL LOW (ref 39.0–52.0)
Hemoglobin: 11.2 g/dL — ABNORMAL LOW (ref 13.0–17.0)
Hemoglobin: 10.4 g/dL — ABNORMAL LOW (ref 13.0–17.0)
MCH: 30.9 pg (ref 26.0–34.0)
MCH: 30.8 pg (ref 26.0–34.0)
MCHC: 34.6 g/dL (ref 30.0–36.0)
MCHC: 34.3 g/dL (ref 30.0–36.0)
MCV: 89.5 fL (ref 80.0–100.0)
MCV: 89.6 fL (ref 80.0–100.0)
Platelets: 102 K/uL — ABNORMAL LOW (ref 150–400)
Platelets: 108 K/uL — ABNORMAL LOW (ref 150–400)
RBC: 3.62 MIL/uL — ABNORMAL LOW (ref 4.22–5.81)
RBC: 3.38 MIL/uL — ABNORMAL LOW (ref 4.22–5.81)
RDW: 17.6 % — ABNORMAL HIGH (ref 11.5–15.5)
RDW: 17.8 % — ABNORMAL HIGH (ref 11.5–15.5)
WBC: 15.3 K/uL — ABNORMAL HIGH (ref 4.0–10.5)
WBC: 17.2 K/uL — ABNORMAL HIGH (ref 4.0–10.5)
nRBC: 1 % — ABNORMAL HIGH (ref 0.0–0.2)
nRBC: 0.4 % — ABNORMAL HIGH (ref 0.0–0.2)

## 2024-12-09 LAB — URINALYSIS, ROUTINE W REFLEX MICROSCOPIC
Glucose, UA: NEGATIVE mg/dL
Hgb urine dipstick: NEGATIVE
Ketones, ur: NEGATIVE mg/dL
Leukocytes,Ua: NEGATIVE
Nitrite: NEGATIVE
Protein, ur: 30 mg/dL — AB
Specific Gravity, Urine: 1.02 (ref 1.005–1.030)
pH: 5.5 (ref 5.0–8.0)

## 2024-12-09 LAB — COOXEMETRY PANEL
Carboxyhemoglobin: 2.3 % — ABNORMAL HIGH (ref 0.5–1.5)
Methemoglobin: <0.7 % (ref 0.0–1.5)
O2 Saturation: 75.4 %
Total hemoglobin: 10.6 g/dL — ABNORMAL LOW (ref 12.0–16.0)

## 2024-12-09 LAB — URINALYSIS, MICROSCOPIC (REFLEX)

## 2024-12-09 LAB — GLUCOSE, CAPILLARY
Glucose-Capillary: 105 mg/dL — ABNORMAL HIGH (ref 70–99)
Glucose-Capillary: 106 mg/dL — ABNORMAL HIGH (ref 70–99)
Glucose-Capillary: 115 mg/dL — ABNORMAL HIGH (ref 70–99)
Glucose-Capillary: 116 mg/dL — ABNORMAL HIGH (ref 70–99)
Glucose-Capillary: 125 mg/dL — ABNORMAL HIGH (ref 70–99)
Glucose-Capillary: 127 mg/dL — ABNORMAL HIGH (ref 70–99)
Glucose-Capillary: 137 mg/dL — ABNORMAL HIGH (ref 70–99)
Glucose-Capillary: 90 mg/dL (ref 70–99)

## 2024-12-09 LAB — BASIC METABOLIC PANEL WITH GFR
Anion gap: 9 (ref 5–15)
BUN: 31 mg/dL — ABNORMAL HIGH (ref 6–20)
CO2: 29 mmol/L (ref 22–32)
Calcium: 8.9 mg/dL (ref 8.9–10.3)
Chloride: 108 mmol/L (ref 98–111)
Creatinine, Ser: 0.87 mg/dL (ref 0.61–1.24)
GFR, Estimated: >60 mL/min (ref >60)
Glucose, Bld: 132 mg/dL — ABNORMAL HIGH (ref 70–99)
Potassium: 4 mmol/L (ref 3.5–5.1)
Sodium: 146 mmol/L — ABNORMAL HIGH (ref 135–145)

## 2024-12-09 LAB — PHOSPHORUS: Phosphorus: 3.5 mg/dL (ref 2.5–4.6)

## 2024-12-09 LAB — MAGNESIUM: Magnesium: 2 mg/dL (ref 1.7–2.4)

## 2024-12-09 LAB — PROCALCITONIN: Procalcitonin: 4.79 ng/mL

## 2024-12-09 LAB — HEPARIN LEVEL (UNFRACTIONATED): Heparin Unfractionated: <0.1 [IU]/mL — ABNORMAL LOW (ref 0.30–0.70)

## 2024-12-09 LAB — LACTATE DEHYDROGENASE: LDH: 801 U/L — ABNORMAL HIGH (ref 105–235)

## 2024-12-09 MED ORDER — POTASSIUM CHLORIDE 10 MEQ/50ML IV SOLN: 10.0000 meq | INTRAVENOUS | Status: DC

## 2024-12-09 MED ORDER — POTASSIUM CHLORIDE 10 MEQ/50ML IV SOLN
10.0000 meq | INTRAVENOUS | Status: AC
  Administered 2024-12-09 (×3): 10 meq via INTRAVENOUS
  Filled 2024-12-09 (×3): qty 50

## 2024-12-09 MED ORDER — POTASSIUM CHLORIDE 10 MEQ/100ML IV SOLN: 10.0000 meq | INTRAVENOUS | Status: DC

## 2024-12-09 MED ORDER — FUROSEMIDE 10 MG/ML IJ SOLN
80.0000 mg | Freq: Once | INTRAMUSCULAR | Status: AC
  Administered 2024-12-09: 10:00:00 80 mg via INTRAVENOUS
  Filled 2024-12-09: qty 8

## 2024-12-09 MED ORDER — INSULIN ASPART 100 UNIT/ML IJ SOLN
0.0000 [IU] | INTRAMUSCULAR | Status: DC
  Administered 2024-12-09 – 2024-12-13 (×16): 1 [IU] via SUBCUTANEOUS
  Administered 2024-12-13: 2 [IU] via SUBCUTANEOUS
  Filled 2024-12-09 (×7): qty 1
  Filled 2024-12-09: qty 5
  Filled 2024-12-09: qty 1
  Filled 2024-12-09 (×2): qty 2
  Filled 2024-12-09 (×5): qty 1

## 2024-12-09 MED ORDER — TRACE MINERALS CU-MN-SE-ZN 300-55-60-3000 MCG/ML IV SOLN
INTRAVENOUS | Status: AC
  Filled 2024-12-09: qty 875.53

## 2024-12-09 MED ORDER — POTASSIUM CHLORIDE 10 MEQ/50ML IV SOLN
10.0000 meq | INTRAVENOUS | Status: AC
  Administered 2024-12-09 (×3): 10 meq via INTRAVENOUS
  Filled 2024-12-09 (×3): qty 50

## 2024-12-09 MED ORDER — SODIUM CHLORIDE 0.9 % IV SOLN
100.0000 mg | INTRAVENOUS | Status: DC
  Administered 2024-12-10: 09:00:00 100 mg via INTRAVENOUS
  Filled 2024-12-09 (×2): qty 5

## 2024-12-09 MED ORDER — SODIUM CHLORIDE 0.9% FLUSH
10.0000 mL | Freq: Three times a day (TID) | INTRAVENOUS | Status: DC
  Administered 2024-12-09 – 2024-12-11 (×5): 10 mL via INTRAPLEURAL

## 2024-12-09 MED ORDER — HEPARIN (PORCINE) 25000 UT/250ML-% IV SOLN
500.0000 [IU]/h | INTRAVENOUS | Status: DC
  Administered 2024-12-09: 11:00:00 500 [IU]/h via INTRAVENOUS
  Filled 2024-12-09: qty 250

## 2024-12-09 MED ORDER — FLEET ENEMA RE ENEM: 1.0000 | ENEMA | Freq: Once | RECTAL | Status: DC

## 2024-12-09 MED ORDER — TRACE MINERALS CU-MN-SE-ZN 300-55-60-3000 MCG/ML IV SOLN: INTRAVENOUS | Status: DC

## 2024-12-09 MED ORDER — SODIUM CHLORIDE 0.9 % IV SOLN
200.0000 mg | Freq: Once | INTRAVENOUS | Status: AC
  Administered 2024-12-09: 10:00:00 200 mg via INTRAVENOUS
  Filled 2024-12-09: qty 10

## 2024-12-09 MED ORDER — FUROSEMIDE 10 MG/ML IJ SOLN
80.0000 mg | Freq: Once | INTRAMUSCULAR | Status: AC
  Administered 2024-12-09: 16:00:00 80 mg via INTRAVENOUS
  Filled 2024-12-09: qty 8

## 2024-12-09 MED ORDER — DEXMEDETOMIDINE HCL IN NACL 400 MCG/100ML IV SOLN
0.0000 ug/kg/h | INTRAVENOUS | Status: DC
  Administered 2024-12-09: 23:00:00 1 ug/kg/h via INTRAVENOUS
  Administered 2024-12-09: 16:00:00 0.5 ug/kg/h via INTRAVENOUS
  Administered 2024-12-09: 08:00:00 0.4 ug/kg/h via INTRAVENOUS
  Administered 2024-12-10: 22:00:00 0.5 ug/kg/h via INTRAVENOUS
  Administered 2024-12-10: 04:00:00 1 ug/kg/h via INTRAVENOUS
  Administered 2024-12-10: 10:00:00 0.5 ug/kg/h via INTRAVENOUS
  Filled 2024-12-09 (×7): qty 100

## 2024-12-09 MED ORDER — CALCIUM GLUCONATE-NACL 2-0.675 GM/100ML-% IV SOLN
2.0000 g | Freq: Once | INTRAVENOUS | Status: AC
  Administered 2024-12-09: 13:00:00 2000 mg via INTRAVENOUS
  Filled 2024-12-09: qty 100

## 2024-12-09 NOTE — Progress Notes (Signed)
 NAME:  JOSEL KEO, MRN:  [id number], DOB:  1981-07-05, LOS: 16 ADMISSION DATE:  11/22/2024, CONSULTATION DATE:  12/8 REFERRING MD:  Lucas, CHIEF COMPLAINT:  post cardiac surgery critical care services    History of Present Illness:  43 year old male patient with history of hypertension, alcohol and tobacco abuse, presented to the emergency room on 11/30 with approximately 1 month history of progressive dyspnea accompanied by lower extremity swelling extending up to the level of his scrotum and abdomen. Diagnostic evaluation by echocardiogram showed left ventricular ejection fraction 35 to 40% with grade 3 diastolic dysfunction this was further complicated by severe mitral valve regurgitation and aortic insufficiency because of this he was transferred to [facility] for further evaluation. He underwent cardiac catheterization on 12/4: Right heart hemodynamic parameters showed baseline right atrial pressure 5 mmHg, PA pressure 32/17, pulmonary capillary wedge pressure at 14 estimated Fick 4.9 L/min with cardiac index Fick calculated at 2.59 His PAPi was 3 Left heart cath was negative for coronary artery disease Went to OR 12/8 for MVR and AVR w/ impella insertion. PCCM asked to assist w/ post op care   OR course EBL: 1735 Received  Products: cryo 92ml, FFP 401 Also received DDAVP , 2200 crystalloid, 250ml albumin   Cell saver 1125 Pump time 4hrs Clamp time 2hrs 50 min  Events: multiple defibrillations intra-op  Intra-op ECHO EF estimated 40%  Pertinent  Medical History  Tobacco abuse, alcohol abuse, hypertension  Significant Hospital Events: Including procedures, antibiotic start and stop dates in addition to other pertinent events   11/30 admitted/. ECHO 35 to 40% with grade 3 diastolic dysfunction this was further complicated by severe mitral valve regurgitation and aortic insufficiency 12/4 left and right heart cath 12/8 AVR and MVR w/ bioprosthetic valves. Arrived on icu  w/ Impella 5.5 MCS at flow 4 lpm and P 7. Received 2 more PLTs, 2 FFP and 1 cryo in first 6 hrs post op for cont blood loss/oozing. Required NE, epi and milrinone . Good flow on IMPELLA but minimal pulsatility  at P7. Placed on Amio gtt for VT 12/9 received 1 unit PRBC over night for hgb down to 7.5, hgb 8 getting second unit of blood. Still on P7 3.5 lPM. Extubated. 12/11 1L thora, later hemorrhagic shock and hemothorax> IR embolization. Also found to have ischemic bowel and underwent ex-lap with partial resection of ileum. 12/15 plan for ostomy tomorrow, transition epi to levophed   12/16 plan back to OR for ostomy placement  12/17 hemodynamically stable off pressors on Milrinone  0.25 , unable to tolerate SBT, plan for CT chest and heparin  gtt, high fevers>micafungin   Interim History / Subjective:  Off pressors, remains on Milrinone  0.25, Tmax 102.44F blood cultures and Micafungin  initiated  Unable to tolerate SBT, plan to obtain CT chest Impella remains on P8 CVP 13 CO 6.2 CI 3.3 Coox 75%  Objective    Blood pressure 114/86, pulse (!) 104, temperature 100.2 F (37.9 C), resp. rate 20, height 5' 10 (1.778 m), weight 81.5 kg, SpO2 100%. PAP: (36-56)/(18-35) 36/26 CVP:  [8 mmHg-26 mmHg] 13 mmHg CO:  [5.2 L/min-7.4 L/min] 6.2 L/min CI:  [2.8 L/min/m2-3.9 L/min/m2] 3.3 L/min/m2  Vent Mode: PRVC FiO2 (%):  [40 %] 40 % Set Rate:  [22 bmp] 22 bmp Vt Set:  [580 mL] 580 mL PEEP:  [5 cmH20] 5 cmH20 Plateau Pressure:  [21 cmH20-26 cmH20] 21 cmH20   Intake/Output Summary (Last 24 hours) at 12/09/2024 0957 Last data filed at 12/09/2024 0955 Gross per  24 hour  Intake 3927.48 ml  Output 7165 ml  Net -3237.52 ml   Filed Weights   12/07/24 0500 12/08/24 0500 12/09/24 0420  Weight: 82.9 kg 81.8 kg 81.5 kg    General: Critically ill male, intubated and sedated, waking up in NAD  HEENT: MM pink/moist, sclera anicteric, ETT in place  Neuro: examined on fentanyl  gtt, pt is waking up and  following commands  CV: s1s2 tachycardic, no m/r/g, Impella on P8 PULM: Diminished in the bilateral bases right greater than left, right chest tube in place with serosanguineous output, back on PRVC after was tachypneic with low tidal volumes on pressure control GI: Right ostomy with minimal bloody output Extremities: warm/dry, 1+ edema     Chest tube output: 110cc 6.2 L urine output yesterday, net -2.7  CXR with right effusion appears larger  Labs: White blood cell count 15K, uptrending from 12 Hemoglobin 11.2 Platelets 102, up from 80 LDH 801 Coox 75% Pro-Cal 4.79  Resolved problem list   AKI Lactic acidosis, resolved Constipation Hyponatremia  Assessment and Plan   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 VT; frequent ectopy -Remains on amiodarone  infusion at 30 mg -Off nor epi today, remains on milrinone  0.41mcg -impella + bicarb purge at P8 with 4.2 L flow - Plan for heparin  GTT at fixed dose 500/h -GDMT limited by recent shock state -Lasix  80mg  once today -eventually needs warfarin for 3 months; to be started after impella -con't swan  Fevers, distributive shock  Fever curve worse today, with open abdomen at risk for fungemia -Repeat blood cultures, continue Vanco and Zosyn , start micafungin  -can't pull any lines/ tubes at this point   R pleural effusion> hemothorax post effusion, now with retained blood and chest tube in place.  Hypoxic respiratory failure Concern that will not be able to liberate from the ventilator with persistent right effusion despite chest tube -CT chest without contrast today, may need VATS -con't chest tube to suction, flush to prevent clotting -Maintain full vent support with SAT/SBT as tolerated -titrate Vent setting to maintain SpO2 greater than or equal to 90%. -HOB elevated 30  degrees. -Plateau pressures less than 30 cm H20.  -Follow chest x-ray, ABG prn.   -Bronchial hygiene and RT/bronchodilator protocol. - Precedex  and fentanyl  for PAD protocol      Ischemic bowel with pneumatosis; s/p partial ileum resection.  Currently in discontinuity. OR 12/14 for reexploration of the abdomen, found necrotic portions of the distal ileum and cecum along with the anterior lateral ascending colon. Underwent R colectomy without anastomosis 12/16 abdomen closed with right ostomy placement -con't TPN & PICC; keeping lipids out of TPN until extubated -monitor ostomy output - continue broad-spectrum antibiotics and starting micafungin  today   Hyperglycemia; h/o pre-DM. A1c 6.1 -SSI PRN -goal BG 140-180 -OP follow up with PCP for prediabetes   H/o tobacco abuse H/o asthma vs COPD -Quitting smoking recommended; have counseled him on this previously -nebs while on vent -pulmonary hygiene   ETOH abuse -wants to quit, address when appropriate  -TPN   Acute blood loss anemia, thrombocytopenia, coagulopathy due to hemorrhagic shock, consumption with impella Platelets slowly recovering -Repeat CBC this p.m. -transfuse for Hb <7 or hemodynamically significant bleeding -monitor coags -per TCTS high threshold for platelet transfusion unless bleeding     DVT: Fixed dose heparin  drip Needs foley, lines, chest tube, impella FEN-TPN  Critical care time:  35 minutes      CRITICAL CARE Performed by: Leita SAUNDERS Cathan Gearin   Total critical care time:  35 minutes  Critical care time was exclusive of separately billable procedures and treating other patients.  Critical care was necessary to treat or prevent imminent or life-threatening deterioration.  Critical care was time spent personally by me on the following activities: development of treatment plan with patient and/or surrogate as well as nursing, discussions with consultants, evaluation of patient's response to  treatment, examination of patient, obtaining history from patient or surrogate, ordering and performing treatments and interventions, ordering and review of laboratory studies, ordering and review of radiographic studies, pulse oximetry and re-evaluation of patient's condition.   Leita SAUNDERS Timara Loma, PA-C [facility] See Amion for pager If no response to pager , please call [phone] until 7pm After 7:00 pm call Elink  [phone]

## 2024-12-09 NOTE — Progress Notes (Signed)
 ANTICOAGULATION CONSULT NOTE  Pharmacy Consult for heparin  Indication: bA/MVR  Allergies[1]  Patient Measurements: Height: 5' 10 (177.8 cm) Weight: 81.5 kg (179 lb 10.8 oz) IBW/kg (Calculated) : 73 Heparin  Dosing Weight: 70 kg   Vital Signs: Temp: 101.5 F (38.6 C) (12/17 1900) BP: 91/66 (12/17 1900) Pulse Rate: 79 (12/17 1900)  Labs: Recent Labs    12/08/24 1630 12/09/24 0337 12/09/24 0345 12/09/24 0403 12/09/24 1447  HGB 11.1* 11.2* 10.9*  --  10.4*  HCT 32.2* 32.4* 32.0*  --  30.3*  PLT 80* 102*  --   --  108*  HEPARINUNFRC  --   --   --   --  <0.10*  CREATININE 0.81  --   --  0.93 0.87    Estimated Creatinine Clearance: 110.7 mL/min (by C-G formula based on SCr of 0.87 mg/dL).  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  Postop course complicated by ischemic bowel s/p exlap 12/11 with partial small bowel resection, abdomen left open.  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   Per Dr. Lucas, plan for 3 months of warfarin therapy with dual valve replacement. Not on anticoagulation prior to admission.  Pharmacy consulted for heparin  dosing.  After discussing with CCM, HF, and CCS, plan to start fixed heparin  500 units/hr with NO titrations for now.  Baseline CBC - Hgb 11.2 (improved from 10.5 preop), pltc 102 (up from 61 preop yesterday AM). Oozing from ostomy, large BM from rectum this morning.  PM f/u > heparin  level is undetectable as desired.  No overt bleeding or complications noted.   Goal of Therapy:  HL <0.1 Monitor platelets by anticoagulation protocol: Yes   Plan:  Continue Heparin  infusion 500 units/h, no titrations Daily heparin  level and CBC. Monitor ostomy output,  bleeding  Harlene Barlow, Berdine [employer]ONETTA [employer], [facility] Clinical Pharmacist  12/09/2024 7:42 PM   [facility] pharmacy phone numbers are listed on [email or web address]       [1] No Known Allergies

## 2024-12-09 NOTE — Progress Notes (Signed)
 1 Day Post-Op Procedures (LRB): LAPAROTOMY, EXPLORATORY (N/A) CREATION, ILEOSTOMY END (N/A) EXCISION, SMALL INTESTINE (N/A) Subjective: Ileostomy and abdominal closure yesterday Hemodynamics stable over night Off levo, only milrinone  Continues to make good urine Febrile - blood cultures sent and started antifungal coverage  Objective: Vital signs in last 24 hours: Temp:  [99 F (37.2 C)-102.2 F (39 C)] 101.3 F (38.5 C) (12/17 0745) Pulse Rate:  [92-116] 113 (12/17 0745) Cardiac Rhythm: Normal sinus rhythm;Sinus tachycardia (12/16 2000) Resp:  [0-29] 22 (12/17 0745) BP: (80-118)/(68-89) 110/88 (12/17 0600) SpO2:  [94 %-100 %] 100 % (12/17 0745) Arterial Line BP: (100-139)/(59-89) 126/79 (12/17 0745) FiO2 (%):  [40 %] 40 % (12/17 0330) Weight:  [81.5 kg] 81.5 kg (12/17 0420)  Hemodynamic parameters for last 24 hours: PAP: (38-59)/(18-35) 43/28 CVP:  [8 mmHg-16 mmHg] 14 mmHg CO:  [5.2 L/min-7.4 L/min] 7 L/min CI:  [2.8 L/min/m2-3.9 L/min/m2] 3.8 L/min/m2  Intake/Output from previous day: 12/16 0701 - 12/17 0700 In: 4485.6 [I.V.:2587.7; IV Piggyback:1591] Out: 7265 [Urine:6770; Emesis/NG output:150; Drains:100; Stool:85; Blood:50; Chest Tube:110] Intake/Output this shift: No intake/output data recorded.  General appearance: intubated, sedated Heart: tachy, regular Lungs: rhonchi bilaterally Abdomen: VAC in place Extremities: edema @= Wound: sternal intact  Lab Results: Recent Labs    12/08/24 1630 12/09/24 0337 12/09/24 0345  WBC 12.0* 15.3*  --   HGB 11.1* 11.2* 10.9*  HCT 32.2* 32.4* 32.0*  PLT 80* 102*  --    BMET:  Recent Labs    12/08/24 1630 12/09/24 0345 12/09/24 0403  NA 143 144 146*  K 3.2* 3.6 3.8  CL 106  --  108  CO2 29  --  29  GLUCOSE 129*  --  125*  BUN 28*  --  31*  CREATININE 0.81  --  0.93  CALCIUM  8.3*  --  8.5*    PT/INR:  No results for input(s): LABPROT, INR in the last 72 hours.  ABG    Component Value Date/Time    PHART 7.396 12/09/2024 0345   HCO3 29.5 (H) 12/09/2024 0345   TCO2 31 12/09/2024 0345   ACIDBASEDEF 1.0 12/06/2024 1432   O2SAT 75.4 12/09/2024 0459   CBG (last 3)  Recent Labs    12/08/24 2307 12/09/24 0319 12/09/24 0737  GLUCAP 106* 115* 137*    Assessment/Plan: S/P Procedures (LRB): LAPAROTOMY, EXPLORATORY (N/A) CREATION, ILEOSTOMY END (N/A) EXCISION, SMALL INTESTINE (N/A) POD # 9/3 NEURO- sedated but neuro intact on awakening trials CV- in sinus tachy  Impella p8, 4.2 L/min  Hemodynamic good on milrinone , off levo this morning  Known moderate pericardial effusion RESP- CXR stable  VDRF- vent per CCM  CT non-con chest today to evaluate burden of hemothorax and need for wash out RENAL- cre and lytes stable, continue diuresis per HF team ENDO- CBG well controlled GI - Abdomen closed and end ileostomy on 12/16 - await return of bowel function  On TPN ID- febrile, continue vanc/zosyn , added mica on 12/17 - blood cultures sent Will start hep gtt today at 500 DNT if ok with gen surg Remains critically ill   LOS: 16 days    Con RAMAN Charliee Krenz 12/09/2024

## 2024-12-09 NOTE — Progress Notes (Signed)
 Bilateral lower extremity venous duplex has been completed. Preliminary results can be found in CV Proc through chart review.   12/09/2024 4:24 PM Cathlyn Collet RVT

## 2024-12-09 NOTE — Progress Notes (Signed)
 PHARMACY - TOTAL PARENTERAL NUTRITION CONSULT NOTE   Indication: massive bowel resection   Patient Measurements: Height: 5' 10 (177.8 cm) Weight: 81.5 kg (179 lb 10.8 oz) IBW/kg (Calculated) : 73 TPN AdjBW (KG): 69.9 Body mass index is 25.78 kg/m. Usual Weight: weight 79kg on admission, unclear baseline.   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 Patient underwent an aortic and mitral valve replacement on 12/8 with insertion of Impella. Post-op was found to have continuing HgB drop with noted abdominal pain. Was found to have hemorrhage from intercostal, chest tube and embolization was completed. Workup for dropping HgB included CT angio of chest/ab/and pelvis showed pneumatosis and concern for bowel ischemia. Patient went to OR on 12/12 for ex-lap found to have ischemia of distal ileum over 75cm without perforation. Patient was left in discontinuity with plans to return to OR.  Patient with diet throughout admission however noted nausea and abdominal pain during peri-op time period. Pharmacy consulted to manage TPN.   Glucose / Insulin : no hx DM, A1c 6.1% - CBGs < 180 Used 2 units mSSI in the past 24 hrs Electrolytes: Na/CL 146/108 increasing with diuresis, CoCa 10.4  (iCa low at 1.14), Phos up 3.5, K up 3.8 (received 120 meq yesterday outside of TPN), mg up 2.0 (received 6g IV yesterday outside of TPN) Renal: SCr up slightly 0.8 > 0.93 (BL SCr ~ 0.7), BUN 24.  12/15 Lasix  60 mg IV x 2  12/16 Lasix  80 mg IV x 2  12/18 Lasix  80 mg IV x 2  Hepatic: AST/ALT mildly elevated, tbili stable at 3.9 (no jaundice), alk phos / TG WNL, albumin  2.1 *elevation not due to TPN Intake / Output; MIVF: UOP up 3.5 ml/kg/hr, NG down , drain , chest tube down to 110 mL, LBM 12/16, net -10.4L (improving) GI Imaging: 12/11 CT: pneumatosis of small bowel with gas, concerning for bowel  ischemia  GI Surgeries / Procedures:  12/11 ex-lap with distal ileum with ischemia (75cm), left in discontinuity with plans to return to OR on Sunday to evaluate for further ischemia   12/14 ex lap, placement of wound vac, right colectomy due to necrosis, abdomen left open  12/16 small bowel resection, end ileostomy, closure   Central access: PICC placed 12/04/24 TPN start date: 12/04/24  Nutritional Goals:  Goal concentrated TPN rate with lipids is 72 mL/hr (76g/L AA, 38g/L ILE, and 16.5% CHO will provide 131g AA, 285g CHO, 64g ILE and 2134 kcals per day)  RD Estimated Needs Total Energy Estimated Needs: 2100-2300 kcals Total Protein Estimated Needs: 125-150 g Total Fluid Estimated Needs: 1.8 L  Current Nutrition:  TPN  Plan:  Concentrate TPN as fluid resuscitation is completed receiving diuresis  Primary team switching propofol  to Precedex  Add lipids in TPN  Start TPN with lipids at goal of 72 ml/hr.  Electrolytes in TPN: Decrease Na to 0 mEq/L, incr K 65mEq/L, Ca 7mEq/L, incr Mg 10mEq/L, Phos 61mmol/L, max CL (ratio is 1:2.27, required for TPN compatibility) Add standard MVI and trace elements to TPN Decrease from mSSI to sSSI Q4H  Monitor TPN labs on Mon/Thurs - labs in AM Follow for return of bowel function and initiation of enteral feeds   Give Calcium  gluconate 2g IV   KCL IV x 6 runs per primary team   Alejandro Lopez, PharmD

## 2024-12-09 NOTE — Progress Notes (Incomplete)
 ANTICOAGULATION CONSULT NOTE  Pharmacy Consult for heparin  Indication: bA/MVR  Allergies[1]  Patient Measurements: Height: 5' 10 (177.8 cm) Weight: 81.5 kg (179 lb 10.8 oz) IBW/kg (Calculated) : 73 Heparin  Dosing Weight: 70 kg   Vital Signs: Temp: 100.6 F (38.1 C) (12/17 1515) BP: 99/81 (12/17 1500) Pulse Rate: 96 (12/17 1515)  Labs: Recent Labs    12/08/24 0410 12/08/24 0423 12/08/24 1630 12/09/24 0337 12/09/24 0345 12/09/24 0403 12/09/24 1447  HGB 10.5*   < > 11.1* 11.2* 10.9*  --  10.4*  HCT 30.6*   < > 32.2* 32.4* 32.0*  --  30.3*  PLT 61*  --  80* 102*  --   --  108*  CREATININE 0.80  --  0.81  --   --  0.93  --    < > = values in this interval not displayed.    Estimated Creatinine Clearance: 103.6 mL/min (by C-G formula based on SCr of 0.93 mg/dL).  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  Postop course complicated by ischemic bowel s/p exlap 12/11 with partial small bowel resection, abdomen left open.  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   Per Dr. Lucas, plan for 3 months of warfarin therapy with dual valve replacement. Not on anticoagulation prior to admission.  Pharmacy consulted for heparin  dosing.  After discussing with CCM, HF, and CCS, plan to start fixed heparin  500 units/hr with NO titrations for now.  Baseline CBC - Hgb 11.2 (improved from 10.5 preop), pltc 102 (up from 61 preop yesterday AM). Oozing from ostomy, large BM from rectum this morning.  Goal of Therapy:  HL <0.1 Monitor platelets by anticoagulation protocol: Yes   Plan:  Heparin  infusion 500 units/h, no titrations 6h HL  Monitor ostomy output, bleeding  Maurilio Fila, PharmD Clinical  Pharmacist 12/09/2024  3:37 PM  PM Update - Initial heparin  level undetectable*** as expected.  Output from ostomy more serous this afternoon per RN.  Continue 500 units/hr.  Maurilio Fila, PharmD Clinical Pharmacist 12/09/2024  3:37 PM       [1] No Known Allergies

## 2024-12-09 NOTE — Progress Notes (Addendum)
 Patient ID: Alejandro Lopez, male   DOB: 06/02/1981, 43 y.o.   MRN: [id number]     Advanced Heart Failure Rounding Note  Cardiologist: None  Chief Complaint: Valvular Heart Disease & HFrEF Patient Profile  43 y.o. male w/ h/o heavy alcohol use (at least 5 drinks per evening) and h/o asthma admitted w/ acute systolic heart failure. Strong family history of cardiac disease: mother and maternal grandmother had heart failure and father with CAD. Admitted with HFrEF. Severe valvular diease.  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  12/9 VT/VF ? vagal episode. Extubated 12/11: S/p R thoracentesis with resultant hemothorax.  Chest tube placed. S/p R intercostal artery coil (IR). S/p exp lap for ischemic bowel resection, bowel left in discontinuity 12/15: Back to OR for ischemic R colon and mesenteric ischemia. S/p exp/lap, wound vac exchange, R colectomy without anastomosis.  12/17 Back to OR for small bowel resection and ileostomy   Subjective:    2uPRBCs 12/14, hgb 11.2 today.  Chest tube output - . Plts 92 => 102 => 72 => 56>61>102.   Impella 5.5  Flow (Liters/min): 4.2 Liters/min Performance Level: P8 Recent Labs    12/07/24 0309 12/08/24 0410 12/09/24 0337  LDH 329* 444* 801*  No heparin  today, getting HCO3 purge only.   Swan: PAP: (38-56)/(18-35) 43/28 CVP:  [8 mmHg-14 mmHg] 9 mmHg CO:  [5.2 L/min-7.4 L/min] 7 L/min CI:  [2.8 L/min/m2-3.9 L/min/m2] 3.8 L/min/m2 Co-ox 74%  Milrinone  0.25 mcg. NE off 12/17.  -6.7L UOP with 80 IV BID, net (-) 2.7L  Intubated and sedated, following commands this morning. On Zosyn  + vancomycin  with Tm 102.2. Micafungin  added this morning for candida albicans in trach aspirate.    Objective:   Weight Range: 81.5 kg Body mass index is 25.78 kg/m.   Vital Signs:   Temp:  [99 F (37.2 C)-102.2 F (39 C)] 101.7 F (38.7 C) (12/17 0600) Pulse Rate:  [92-115] 115 (12/17 0600) Resp:  [0-29] 29  (12/17 0600) BP: (80-118)/(68-89) 110/88 (12/17 0600) SpO2:  [94 %-100 %] 100 % (12/17 0600) Arterial Line BP: (100-133)/(59-86) 121/85 (12/17 0600) FiO2 (%):  [40 %] 40 % (12/17 0330) Weight:  [81.5 kg] 81.5 kg (12/17 0420) Last BM Date : 12/08/24  Weight change: Filed Weights   12/07/24 0500 12/08/24 0500 12/09/24 0420  Weight: 82.9 kg 81.8 kg 81.5 kg    Intake/Output:   Intake/Output Summary (Last 24 hours) at 12/09/2024 0711 Last data filed at 12/09/2024 0700 Gross per 24 hour  Intake 4485.58 ml  Output 7265 ml  Net -2779.42 ml    CVP 9 Physical Exam  General:  intubated, critically ill appearing Neck: JVD UTA cm. LIJ Swann Cor: Regular rate & rhythm. +Impella, site stable Lungs: clear, diminished bases. R CT Abd: + ileostomy, +abd pads to mid abd incision.  Extremities: trace BLE edema, + TED hose GU: + foley Neuro: follows commands  Telemetry   ST low 100s, intt NSVT (Personally reviewed)    Labs    CBC Recent Labs    12/08/24 1630 12/09/24 0337 12/09/24 0345  WBC 12.0* 15.3*  --   HGB 11.1* 11.2* 10.9*  HCT 32.2* 32.4* 32.0*  MCV 87.7 89.5  --   PLT 80* 102*  --    Basic Metabolic Panel Recent Labs    87/83/74 1630 12/09/24 0337 12/09/24 0345 12/09/24 0403  NA 143  --  144 146*  K 3.2*  --  3.6 3.8  CL 106  --   --  108  CO2 29  --   --  29  GLUCOSE 129*  --   --  125*  BUN 28*  --   --  31*  CREATININE 0.81  --   --  0.93  CALCIUM  8.3*  --   --  8.5*  MG 1.8 2.0  --   --   PHOS  --  3.5  --  3.5   Liver Function Tests Recent Labs    12/07/24 0309 12/08/24 0410 12/09/24 0403  AST 89* 124*  --   ALT 34 39  --   ALKPHOS 96 112  --   BILITOT 3.9* 3.9*  --   PROT 4.5* 4.6*  --   ALBUMIN  1.7* 1.5* 2.1*   No results for input(s): LIPASE, AMYLASE in the last 72 hours. Cardiac Enzymes No results for input(s): CKTOTAL, CKMB, CKMBINDEX, TROPONINI in the last 72 hours.  BNP: BNP (last 3 results) No results for input(s):  BNP in the last 8760 hours.  ProBNP (last 3 results) Recent Labs    11/22/24 1958  PROBNP 3,354.0*     D-Dimer No results for input(s): DDIMER in the last 72 hours.  Hemoglobin A1C No results for input(s): HGBA1C in the last 72 hours. Fasting Lipid Panel Recent Labs    12/07/24 0309  TRIG 101   Thyroid Function Tests No results for input(s): TSH, T4TOTAL, T3FREE, THYROIDAB in the last 72 hours.  Invalid input(s): FREET3  Other results:   Imaging    DG CHEST PORT 1 VIEW Result Date: 12/04/2024 CLINICAL DATA:  Ventilator dependence. EXAM: PORTABLE CHEST 1 VIEW COMPARISON:  12/03/2024 FINDINGS: Endotracheal tube tip is approximately 3.8 cm above the base of the carina. The NG tube passes into the stomach although the distal tip position is not included on the film. Right-sided Impella device is stable in position. A left IJ pulmonary artery catheter tip overlies expected location of the interlobar pulmonary artery. Right pleural drain again noted with persistent right pleural fluid. Basilar collapse/consolidation noted, IMPRESSION: 1. No substantial interval change. 2. Persistent right pleural effusion with right base collapse/consolidation. 3. Support apparatus as above. Electronically Signed   By: Camellia Candle M.D.   On: 12/04/2024 07:15   DG Abd 1 View Result Date: 12/04/2024 CLINICAL DATA:   Routine adult health maintenance Best images obtainable due to patient's condition EXAM: ABDOMEN - 1 VIEW COMPARISON:  12/04/2024 FINDINGS: NG tube tip is in the distal stomach. Diffuse gaseous distention of colon evident, similar to prior. IMPRESSION: NG tube tip is in the distal stomach. Electronically Signed   By: Camellia Candle M.D.   On: 12/04/2024 07:11   CT Angio Chest/Abd/Pel for Dissection W and/or W/WO Addendum Date: 12/03/2024 ADDENDUM #1 ADDENDUM: ---------------------------------------------------- These results were called to Dr. Daniel  at 10:15 pm on  12/03/2024. Electronically signed by: Franky Crease MD 12/03/2024 10:23 PM EST RP Workstation: [id number]   Result Date: 12/03/2024 ORIGINAL REPORT EXAM: CT CHEST, ABDOMEN AND PELVIS WITH AND WITHOUT CONTRAST 12/03/2024 09:52:07 PM TECHNIQUE: CT of the chest, abdomen and pelvis was performed with and without the administration of intravenous contrast. Multiplanar reformatted images are provided for review. Automated exposure control, iterative reconstruction, and/or weight based adjustment of the mA/kV was utilized to reduce the radiation dose to as low as reasonably achievable. COMPARISON: None available. CLINICAL HISTORY: Acute aortic syndrome (AAS) suspected; Evaluate for intercostal bleeding or other sources after thoracentesis; also evaluate bowel. FINDINGS: CHEST:  MEDIASTINUM AND LYMPH NODES: Mild cardiomegaly. Impella device tip in the left ventricle. Swan-ganz catheter tip in the central right pulmonary artery. No evidence of aortic aneurysm or dissection. The central airways are clear. No mediastinal, hilar or axillary lymphadenopathy. LUNGS AND PLEURA: Right chest tube in place. Moderate to large right pleural effusion with compressive atelectasis in the right middle lobe and right lower lobe. There appears to be active extravasation of contrast posteriorly between the right 9th and 10th ribs and likely into the pleural space, possibly related to prior thoracentesis. The pleural fluid appears complex on the right, likely hemothorax. Small left pleural effusion with compressive atelectasis in the left lower lobe. Tiny right pneumothorax. Biapical subpleural blebs. No evidence of pulmonary embolus. ABDOMEN AND PELVIS: LIVER: Gas is seen branching peripherally in the liver compatible with portal venous gas. GALLBLADDER AND BILE DUCTS: Gallbladder is unremarkable. No biliary ductal dilatation. SPLEEN: No acute abnormality. PANCREAS: No acute abnormality. ADRENAL GLANDS: No acute abnormality. KIDNEYS, URETERS  AND BLADDER: No stones in the kidneys or ureters. No hydronephrosis. No perinephric or periureteral stranding. Urinary bladder is unremarkable. GI AND BOWEL: Stomach and small bowel are decompressed. Diffuse gaseous distention of the colon suggests ileus. Moderate stool burden in the rectosigmoid colon. Pneumatosis noted in small bowel loops in the lower pelvis with air in the mesenteric veins feeding these small bowel loops. Pneumatosis also noted in the right colon wall. REPRODUCTIVE ORGANS: No acute abnormality. PERITONEUM AND RETROPERITONEUM: No ascites. No free air. VASCULATURE: Aorta is normal in caliber. ABDOMINAL AND PELVIS LYMPH NODES: No lymphadenopathy. BONES AND SOFT TISSUES: No acute osseous abnormality. No focal soft tissue abnormality. IMPRESSION: 1. Active contrast extravasation between the right 9th and 10th ribs with likely extension into the pleural space, suspicious for ongoing intercostal bleeding likely related to recent thoracentesis. 2. Moderate to large right pleural effusion compatible with hemothorax with compressive atelectasis in the right middle and lower lobes; right chest tube present; tiny right pneumothorax. 3. No evidence of aortic aneurysm, aortic dissection, or pulmonary embolus. 4. Pneumatosis involving small bowel loops in the lower pelvis and the right colon with mesenteric venous gas and portal venous gas, concerning for bowel ischemia. Recommend surgical consultation. 5. Small left pleural effusion with compressive atelectasis in the left lower lobe. 6. . Attempts are being made to contact the ordering physician with the results. An addendum will be made at that time. Electronically signed by: Franky Crease MD 12/03/2024 10:09 PM EST RP Workstation: [id number]   DG Chest Port 1 View Result Date: 12/03/2024 EXAM: 1 VIEW(S) XRAY OF THE CHEST 12/03/2024 07:57:00 PM COMPARISON: Portable chest today at 7:02 pm. CLINICAL HISTORY: Encounter for chest tube placement. FINDINGS:  LINES, TUBES AND DEVICES: A pigtail chest tube has been inserted on the right through the 6th intercostal space, with the pigtail in the lateral upper right thorax. Left IJ Swan-Ganz line again terminates in the distal right pulmonary artery. Stable positioning of right subclavian Impella device. LUNGS AND PLEURA: No pneumothorax. Moderate to large right pleural effusion is still present but decreased in the interval. Small left pleural effusion unchanged. The right mid to lower lung is obscured by pleural fluid. There is patchy consolidation in the left lower lung field. Left mid and both apical lungs are clear. HEART AND MEDIASTINUM: Sternotomy sutures and two cardiac valve replacements are again shown. There is mild cardiomegaly and mild central vascular prominence as before. BONES AND SOFT TISSUES: No acute osseous abnormality. IMPRESSION: 1. Right pigtail chest tube  in position with no measurable pneumothorax. 2. Moderate to large right pleural effusion, decreased compared to earlier today. 3. Patchy consolidation in the left lower lung field. 4. Small left pleural effusion, unchanged. 5. Cardiomegaly . Electronically signed by: Francis Quam MD 12/03/2024 08:10 PM EST RP Workstation: [id number]   DG CHEST PORT 1 VIEW Result Date: 12/03/2024 EXAM: 1 VIEW(S) XRAY OF THE CHEST 12/03/2024 07:05:00 PM COMPARISON: None available. CLINICAL HISTORY: ABLA (acute blood loss anemia). FINDINGS: LINES, TUBES AND DEVICES: Impella device and Swan-Ganz catheter remain in place, unchanged. LUNGS AND PLEURA: Enlarging right pleural effusion, now large with atelectasis throughout the right lung. Vascular congestion and left basilar atelectasis. Low lung volumes. No pneumothorax. HEART AND MEDIASTINUM: No acute abnormality of the cardiac and mediastinal silhouettes. BONES AND SOFT TISSUES: No acute osseous abnormality. IMPRESSION: 1. Enlarging right pleural effusion, now large, with associated right lung atelectasis. 2.  Vascular congestion with left basilar atelectasis. 3. Low lung volumes. Electronically signed by: Franky Crease MD 12/03/2024 07:10 PM EST RP Workstation: [id number]   DG Chest Port 1 View Result Date: 12/03/2024 CLINICAL DATA:  Pleural effusion, status post thoracentesis EXAM: PORTABLE CHEST 1 VIEW COMPARISON:  12/03/2024 FINDINGS: Single frontal view of the chest demonstrates stable left internal jugular flow directed central venous catheter, tip overlying the right infrahilar region. Stable mitral and aortic valve prostheses. Cardiac silhouette remains enlarged. Lung volumes are diminished, with bibasilar veiling opacities, right greater than left, consistent with consolidation and effusions. No appreciable change since prior study. No evidence of pneumothorax. IMPRESSION: 1. Persistent bibasilar consolidation and effusions, right greater than left. 2. No evidence of pneumothorax. 3. Stable enlarged cardiac silhouette. Electronically Signed   By: Ozell Daring M.D.   On: 12/03/2024 16:10    Medications:   Scheduled Medications:  arformoterol   15 mcg Nebulization BID   Chlorhexidine  Gluconate Cloth  6 each Topical Daily   folic acid   1 mg Intravenous Daily   heparin  injection (subcutaneous)  5,000 Units Subcutaneous Q8H   insulin  aspart  0-15 Units Subcutaneous Q4H   lidocaine   1 patch Transdermal Q24H   mouth rinse  15 mL Mouth Rinse Q2H   pantoprazole  (PROTONIX ) IV  40 mg Intravenous Daily   revefenacin   175 mcg Nebulization Daily   sodium chloride  flush  3 mL Intravenous Q12H   thiamine  (VITAMIN B1) injection  100 mg Intravenous Daily    Infusions:  sodium chloride  20 mL/hr at 12/08/24 1500   sodium chloride  10 mL/hr at 12/09/24 0600   acetaminophen  Stopped (12/09/24 0522)   amiodarone  30 mg/hr (12/09/24 0600)   dexmedetomidine  (PRECEDEX ) IV infusion     fentaNYL  infusion INTRAVENOUS 100 mcg/hr (12/09/24 0637)   micafungin  (MYCAMINE ) 200 mg in sodium chloride  0.9 % 100 mL IVPB      Followed by   NOREEN ON 12/10/2024] micafungin  (MYCAMINE ) 100 mg in sodium chloride  0.9 % 100 mL IVPB     milrinone  0.25 mcg/kg/min (12/09/24 0600)   norepinephrine  (LEVOPHED ) Adult infusion Stopped (12/09/24 0301)   piperacillin -tazobactam 3.375 g (12/09/24 0631)   propofol  (DIPRIVAN ) infusion 25 mcg/kg/min (12/09/24 0600)   sodium bicarbonate  25 mEq (Impella PURGE) in dextrose  5 % 1000 mL bag     TPN ADULT (ION) 60 mL/hr at 12/09/24 0600   vancomycin  Stopped (12/08/24 2242)    PRN Medications: sodium chloride , sodium chloride , sodium chloride , albuterol , fentaNYL , fentaNYL  (SUBLIMAZE ) injection, hydrALAZINE , ondansetron  (ZOFRAN ) IV, mouth rinse, mouth rinse, sodium chloride , sodium chloride  flush  Assessment/Plan  1.  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  Valvular Disease  - severe MR posteriorly directed, mod TR. Likely functional.  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 Acute Systolic Heart Failure w/ Biventricular Dysfunction  - HS trop not c/w ACS but EKG w/ anteroseptal Qs  - CMRI Severe AR/MR. LVEF 35%  - Cath- normal cors, RA5, PA 32/17 (24), PVR 2.57 Papi 3. CO 4.9 CI 3.8 - Post-op Echo 12/02/24: EF < 20% with severe LVH (new, ?inflammatory), moderately decreased RV function, stable bioprosthetic MV and AoV.  - Fluid resuscitation with bleeding on 12/11 and Lasix  stopped.  - CVP 9 today, co-ox 75.  - Diuresed well with 80 IV BID lasix  yesterday. -6.7L UOP, net (-) 2.7L. Will give 80 x1 this morning.  - Today on milrinone  0.25 mcg, off NE. - Impella @ P8 Flow 4.2.  HCO3 purge only.  Starting hep gtt today. Plan for possible impella wean today.  - LDH 241 => 299 => 324> 444>801.  - Hold digoxin  - Holding GDMT until back off pressors.   3. R hemothorax - Hemothorax s/p right thoracentesis with intercostal artery injury. Massive bleeding requiring multiple products and  development of hemorrhagic shock, now improved. S/p IR embolization.  - Hgb 8.9>7.8 >9.2>7.5>>>10.2>9.7>10.3>10.5>11.2 - Transfuse hgb < 8.  - Starting low dose heparin  today - Chest tube in place, - OP.  - Plan for chest CT today per TCTS to access for washout need depending on hemothorax burden.   4. Transaminates - Hepatitis panel negative. RUQ US  unremarkable  - suspect 2/2 CHF/hepatic congestion + chronic ETOH use    5.  ETOH Abuse - heavy drinker, at least 5 drinks per evening - may be contributing to CM, reduction of intake imperative  - No evidence of withdrawal   6  Hypomagnesemia - follow; goal >2  - replete   7 . DMII  - Hgb A1C 6.1 - On SSI.  8. Ischemic bowel Mesenteric ischemia - CXR with dilated bowels.  - CT scan demonstrated active extravasation from a posterior intercostal artery. Also demonstrated portal venous gas and pneumatosis of the small and large bowel.  - S/p exp/lap 12/11. Patchy ischemia of the distal ileum extending over about 75 cm without perforation. Ischemic segment resected.  Colon was distended with gas but viable. Small bowel left in discontinuity.  - Back to OR 12/15 for ischemic R colon and mesenteric ischemia. S/p exp/lap, wound vac exchange, R colectomy without anastomosis.  - Returned to OR 12/15 for small bowel resection and ileostomy.   9. ID - Covering bowel pathogens with Zosyn .  - Tm 102.2, continue vancomycin .  - Tylenol  ordered - Micafungin  added this morning for candida albicans in trach aspirate.   10. AKI - AKI with shock.  - Resolved  11. FEN - TPN  12. Rhythm - NSR/ST today, has been in accelerated junctional rhythm as well.   13. VT - On amiodarone  gtt.   14. Pericardial effusion - There is a moderate effusion without tamponade, noted again by echo 12/16.  Will need to follow over time.  - Repeat limited echo today to reassess.   CRITICAL CARE Performed by: Beckey LITTIE Coe   Total critical care time: 17  minutes  Critical care time was exclusive of separately billable procedures and treating other patients.  Critical care was necessary to treat or prevent imminent or life-threatening deterioration.  Critical  care was time spent personally by me on the following activities: development of treatment plan with patient and/or surrogate as well as nursing, discussions with consultants, evaluation of patient's response to treatment, examination of patient, obtaining history from patient or surrogate, ordering and performing treatments and interventions, ordering and review of laboratory studies, ordering and review of radiographic studies, pulse oximetry and re-evaluation of patient's condition.   Length of Stay: 16  Beckey LITTIE Coe, NP  12/09/2024, 7:11 AM  Advanced Heart Failure Team Pager [phone] (M-F; 7a - 5p)  Please contact [facility] for night-coverage after hours (5p -7a ) and weekends on [email or web address]  Patient seen with NP, I formulated the plan and agree with the above note.   I/Os net negative [id number] yesterday.  Today, CVP 12.  He is on milrinone  0.25 and Impella P8 with flow 4.1 L/min.  Co-ox 75%.   Febrile to 102, now on vancomycin /Zosyn .  PCT 4.79  In NSR on amiodarone  gtt.    Yesterday had ileostomy created/abdomen closed.  On TPN.   General: Sedated on vent Neck: JVP 12 cm, no thyromegaly or thyroid nodule.  Lungs: Clear to auscultation bilaterally with normal respiratory effort. CV: Nondisplaced PMI.  Heart regular S1/S2, no S3/S4, no murmur.  No peripheral edema.   Abdomen: Soft, ostomy present, no hepatosplenomegaly, no distention.  Skin: Intact without lesions or rashes.  Neurologic: Will follow commands when he awakens.  Extremities: No clubbing or cyanosis.  HEENT: Normal.   Stable hemodynamics today, Impella 5.5 P8 with flow 4.1 and CI 3.3.  Continue milrinone  0.25, now off NE.  Plts higher 102 but LDH higher at 801.  Impella position looked good on echo yesterday,  with platelets rising, suspect more likely due to GI surgery yesterday than hemolysis.  Regardless, will start weaning Impella today.  Decrease to P7.  Will repeat limited echo to follow pericardial effusion as well as Impella position.     CVP 12.  Lasix  80 mg IV bid again today, replace K.    On vancomycin /Zosyn , still with fevers and PCT high.  Adding micafungin .   Getting TPN, not using gut yet.   NSR on amiodarone  gtt, starting low dose heparin  today.   CRITICAL CARE Performed by: Ezra Shuck  Total critical care time: 45 minutes  Critical care time was exclusive of separately billable procedures and treating other patients.  Critical care was necessary to treat or prevent imminent or life-threatening deterioration.  Critical care was time spent personally by me on the following activities: development of treatment plan with patient and/or surrogate as well as nursing, discussions with consultants, evaluation of patient's response to treatment, examination of patient, obtaining history from patient or surrogate, ordering and performing treatments and interventions, ordering and review of laboratory studies, ordering and review of radiographic studies, pulse oximetry and re-evaluation of patient's condition.   Ezra Shuck 12/09/2024 10:33 AM

## 2024-12-09 NOTE — Progress Notes (Signed)
 Patient was transported to CT @1025  via the ventilator with no complications.    Patient had return trip from CT @ 1053 via the ventilator with no complications.  Ventilator plugged into a red outlet and oxygen  and air hose connected to the wall.

## 2024-12-09 NOTE — Progress Notes (Incomplete)
 12/09/2024   I have seen and evaluated the patient for postop care and agree with APP's exam and plan below with following additions:  Stable night Labs look okay More awake this am Weak on PS trial Will try precedex  and wean down prop Worry that we may need to address R pleural space to liberate from vent but will see how things go Likely can try heparin  gtt today WBC rising, plts rising, worsening fevers: check blood cultures, UA, LE duplex, pct; remains on vanc/zosyn , adding antifungals for now   Patient Lines/Drains/Airways Status     Active Line/Drains/Airways     Name Placement date Placement time Site Days   Arterial Line 12/06/24 Right Radial 12/06/24  --  Radial  3   Peripheral IV 11/26/24 22 G 1.75 Anterior;Right Forearm 11/26/24  1757  Forearm  13   PICC Triple Lumen 12/04/24 Right Brachial 40 cm 0 cm 12/04/24  1623  -- 5   Impella 11/30/24  1409  -- 9   Chest Tube Lateral;Right Pleural 14 Fr. 12/03/24  1930  Pleural  6   NG/OG Vented/Dual Lumen 18 Fr. Oral External length of tube 80 cm 12/04/24  0205  Oral  5   Ileostomy Standard (end) RLQ 12/08/24  1352  RLQ  1   Urethral Catheter [facility] RN Temperature probe 12/03/24  2345  Temperature probe  6   Airway 7.5 mm 12/04/24  0126  -- 5   Pulmonary Artery Catheter 11/30/24 Left 11/30/24  1052  -- 9   Wound 11/30/24 1512 Surgical Closed Surgical Incision Chest Other (Comment) 11/30/24  1512  Chest  9   Wound 11/30/24 1512 Surgical Closed Surgical Incision Chest Right 11/30/24  1512  Chest  9   Wound Back --  --  Back  --   Wound 12/05/24 1000 Pressure Injury Buttocks Mid Deep Tissue Pressure Injury - Purple or maroon localized area of discolored intact skin or blood-filled blister due to damage of underlying soft tissue from pressure and/or shear. 12/05/24  1000  Buttocks  4   Wound 12/05/24 1033  Back Left;Upper 12/05/24  1033  Back  4   Wound 12/08/24 1408 Surgical Closed Surgical Incision Abdomen Other  (Comment) 12/08/24  1408  Abdomen  1            sodium chloride  20 mL/hr at 12/08/24 1500   sodium chloride  10 mL/hr at 12/09/24 0600   acetaminophen  Stopped (12/09/24 0522)   amiodarone  30 mg/hr (12/09/24 0600)   fentaNYL  infusion INTRAVENOUS 100 mcg/hr (12/09/24 9362)   milrinone  0.25 mcg/kg/min (12/09/24 0600)   norepinephrine  (LEVOPHED ) Adult infusion Stopped (12/09/24 0301)   piperacillin -tazobactam 3.375 g (12/09/24 0631)   propofol  (DIPRIVAN ) infusion 25 mcg/kg/min (12/09/24 0600)   sodium bicarbonate  25 mEq (Impella PURGE) in dextrose  5 % 1000 mL bag     TPN ADULT (ION) 60 mL/hr at 12/09/24 0600   vancomycin  Stopped (12/08/24 2242)     My cc time: *** minutes   Rolan Sharps MD [facility] Prefer epic messenger for cross cover needs

## 2024-12-09 NOTE — Progress Notes (Signed)
 TCTS PM Rounding Progress Note  Stable day today Still having fevers and a little more ectopy with dex/fent LE duplex negative for DVT CT chest demonstrated moderate retained right hemothorax and moderate pericardial effusion that is bigger  Vitals:   12/09/24 1745 12/09/24 1800  BP:  (!) 86/75  Pulse: 98 62  Resp: (!) 23 (!) 22  Temp: (!) 101.3 F (38.5 C) (!) 101.5 F (38.6 C)  SpO2: 99% 97%    Plan: - Will needs VATS washout and drainage of pericardial effusion, possibly tomorrow - Continue current care  Con Clunes, MD Cardiothoracic Surgery Pager: [phone]

## 2024-12-09 NOTE — Progress Notes (Addendum)
 ANTICOAGULATION CONSULT NOTE  Pharmacy Consult for heparin  Indication: bA/MVR  Allergies[1]  Patient Measurements: Height: 5' 10 (177.8 cm) Weight: 81.5 kg (179 lb 10.8 oz) IBW/kg (Calculated) : 73 Heparin  Dosing Weight: 70 kg   Vital Signs: Temp: 101.3 F (38.5 C) (12/17 0745) BP: 110/88 (12/17 0600) Pulse Rate: 113 (12/17 0745)  Labs: Recent Labs    12/08/24 0410 12/08/24 0423 12/08/24 1630 12/09/24 0337 12/09/24 0345 12/09/24 0403  HGB 10.5*   < > 11.1* 11.2* 10.9*  --   HCT 30.6*   < > 32.2* 32.4* 32.0*  --   PLT 61*  --  80* 102*  --   --   CREATININE 0.80  --  0.81  --   --  0.93   < > = values in this interval not displayed.    Estimated Creatinine Clearance: 103.6 mL/min (by C-G formula based on SCr of 0.93 mg/dL).  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  Postop course complicated by ischemic bowel s/p exlap 12/11 with partial small bowel resection, abdomen left open.  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   Per Dr. Lucas, plan for 3 months of warfarin therapy with dual valve replacement. Not on anticoagulation prior to admission.  Pharmacy consulted for heparin  dosing.  After discussing with CCM, HF, and CCS, plan to start fixed heparin  500 units/hr with NO titrations for now.  Baseline CBC - Hgb 11.2 (improved from 10.5 preop), pltc 102 (up from 61 preop yesterday AM). Oozing from ostomy, large BM from rectum this morning.  Goal of Therapy:  HL <0.1 Monitor platelets by anticoagulation protocol: Yes   Plan:  Heparin  infusion 500 units/h, no titrations 6h HL  Monitor ostomy output, bleeding  Maurilio Fila, PharmD Clinical Pharmacist 12/09/2024  8:58 AM      [1] No Known Allergies

## 2024-12-09 NOTE — Progress Notes (Signed)
 1 Day Post-Op   Subjective/Chief Complaint: Off pressors, intubated, sedated, febrile   Objective: Vital signs in last 24 hours: Temp:  [99 F (37.2 C)-102.2 F (39 C)] 101.3 F (38.5 C) (12/17 0745) Pulse Rate:  [92-116] 113 (12/17 0745) Resp:  [0-29] 22 (12/17 0745) BP: (80-118)/(68-89) 110/88 (12/17 0600) SpO2:  [94 %-100 %] 100 % (12/17 0745) Arterial Line BP: (100-139)/(59-89) 126/79 (12/17 0745) FiO2 (%):  [40 %] 40 % (12/17 0330) Weight:  [81.5 kg] 81.5 kg (12/17 0420) Last BM Date : 12/08/24  Intake/Output from previous day: 12/16 0701 - 12/17 0700 In: 4485.6 [I.V.:2587.7; IV Piggyback:1591] Out: 7265 [Urine:6770; Emesis/NG output:150; Drains:100; Stool:85; Blood:50; Chest Tube:110] Intake/Output this shift: No intake/output data recorded.  Ab wound open, ileostomy with minimal oozing, pink, some edema, no output yet  Lab Results:  Recent Labs    12/08/24 1630 12/09/24 0337 12/09/24 0345  WBC 12.0* 15.3*  --   HGB 11.1* 11.2* 10.9*  HCT 32.2* 32.4* 32.0*  PLT 80* 102*  --    BMET Recent Labs    12/08/24 1630 12/09/24 0345 12/09/24 0403  NA 143 144 146*  K 3.2* 3.6 3.8  CL 106  --  108  CO2 29  --  29  GLUCOSE 129*  --  125*  BUN 28*  --  31*  CREATININE 0.81  --  0.93  CALCIUM  8.3*  --  8.5*   PT/INR No results for input(s): LABPROT, INR in the last 72 hours. ABG Recent Labs    12/08/24 0423 12/09/24 0345  PHART 7.373 7.396  HCO3 28.6* 29.5*    Studies/Results: ECHOCARDIOGRAM LIMITED Result Date: 12/08/2024    ECHOCARDIOGRAM LIMITED REPORT   Patient Name:   Alejandro Lopez Date of Exam: 12/08/2024 Medical Rec #:  [id number]        Height:       70.0 in Accession #:    [id number]       Weight:       180.3 lb Date of Birth:  04-02-1981       BSA:          1.998 m Patient Age:    43 years         BP:           110/82 mmHg Patient Gender: M                HR:           102 bpm. Exam Location:  Inpatient Procedure: Limited Echo (Both  Spectral and Color Flow Doppler were utilized            during procedure). Indications:    Impella Check  History:        Patient has prior history of Echocardiogram examinations, most                 recent 12/07/2024. CHF.  Sonographer:    Jayson Gaskins Referring Phys: [phone] DALTON S MCLEAN IMPRESSIONS  1. Bioprosthetic mitral valve replacement, without Doppler assessment.  2. Limited study for Impella cannula position.  3. Moderate to large circumferential pericardial effusion, with fibrinous material.  4. 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 Global left ventricular hypokinesis, paradoxical septal motion, LVEF approximately 30%. There is a catheter/device lead in the right ventricle. Comparison(s): No significant change from prior study. Prior images reviewed side by side. FINDINGS  Left Ventricle: Jerel Balding MD Electronically signed by Jerel Balding MD Signature Date/Time: 12/08/2024/11:15:03 AM    Final  DG CHEST PORT 1 VIEW Result Date: 12/08/2024 CLINICAL DATA:  Endotracheal tube. EXAM: PORTABLE CHEST 1 VIEW COMPARISON:  12/07/2024 FINDINGS: Sternotomy wires and prosthetic heart valves unchanged. Impella device unchanged. Right-sided PICC line unchanged. Nasogastric tube courses into the stomach and off the image as tip is not visualized. Endotracheal tube has tip 5.8 cm above the carina. Left IJ Swan-Ganz catheter has tip over the proximal right lower lobar artery unchanged. Right chest tube unchanged. Exam demonstrates continued opacification over the right mid to lower lung unchanged to slightly improved likely small to moderate effusion with atelectasis. Infection over the mid to lower right lung is possible. No pneumothorax. Improved aeration over the left base/retrocardiac region. Stable cardiomegaly. Remainder of the exam is unchanged. IMPRESSION: 1. Stable cardiomegaly with continued opacification over the right mid to lower lung unchanged to slightly improved likely small to moderate  effusion with atelectasis. Infection over the mid to lower right lung is possible. 2. Tubes and lines as described. Electronically Signed   By: Toribio Agreste M.D.   On: 12/08/2024 09:08   ECHOCARDIOGRAM LIMITED Result Date: 12/07/2024    ECHOCARDIOGRAM LIMITED REPORT   Patient Name:   Alejandro Lopez Date of Exam: 12/07/2024 Medical Rec #:  [id number]        Height:       70.0 in Accession #:    [id number]       Weight:       182.8 lb Date of Birth:  12/20/1981       BSA:          2.009 m Patient Age:    43 years         BP:           100/78 mmHg Patient Gender: M                HR:           105 bpm. Exam Location:  Inpatient Procedure: Limited Echo (Both Spectral and Color Flow Doppler were utilized            during procedure). Indications:    Impella check  History:        Patient has prior history of Echocardiogram examinations, most                 recent 12/04/2024.  Sonographer:    Tinnie Gosling RDCS Referring Phys: [phone] DALTON S MCLEAN IMPRESSIONS  1. Impella 5.5 tip at 4.6 cm from AV.  2. Lead in RV.  3. Right atrial size was Lead in RA.  4. Moderate pericardium effusion (max 1.3 cm) with fibrinous strand. Moderate pericardial effusion. Comparison(s): Changes from prior study are noted. 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 Compared to prior echo, Impella tip is less deep into LV and the pericardial effusion is more significant. FINDINGS  Left Ventricle: Impella 5.5 tip at 4.6 cm from AV. Right Ventricle: Lead in RV. Right Atrium: Right atrial size was Lead in RA. Pericardium: Moderate pericardium effusion (max 1.3 cm) with fibrinous strand. A moderately sized pericardial effusion is present. Joelle Cedars Tonleu Electronically signed by Joelle Cedars Tonleu Signature Date/Time: 12/07/2024/9:57:48 AM    Final     Anti-infectives: Anti-infectives (From admission, onward)    Start     Dose/Rate Route Frequency Ordered Stop   12/10/24 1000  micafungin  (MYCAMINE ) 100 mg in sodium chloride  0.9 % 100 mL  IVPB       Placed in Followed by Linked Group  100 mg 105 mL/hr over 1 Hours Intravenous Every 24 hours 12/09/24 0704     12/09/24 0830  micafungin  (MYCAMINE ) 200 mg in sodium chloride  0.9 % 100 mL IVPB       Placed in Followed by Linked Group   200 mg 110 mL/hr over 1 Hours Intravenous  Once 12/09/24 0704     12/06/24 2200  vancomycin  (VANCOREADY) IVPB 1250 mg/250 mL        1,250 mg 166.7 mL/hr over 90 Minutes Intravenous Every 12 hours 12/06/24 0811     12/06/24 0900  vancomycin  (VANCOREADY) IVPB 1750 mg/350 mL        1,750 mg 175 mL/hr over 120 Minutes Intravenous  Once 12/06/24 0802 12/06/24 1032   12/04/24 1100  vancomycin  (VANCOCIN ) IVPB 1000 mg/200 mL premix  Status:  Discontinued        1,000 mg 200 mL/hr over 60 Minutes Intravenous Every 12 hours 12/03/24 2238 12/03/24 2242   12/04/24 0000  vancomycin  (VANCOREADY) IVPB 1500 mg/300 mL  Status:  Discontinued        1,500 mg 150 mL/hr over 120 Minutes Intravenous  Once 12/03/24 2236 12/03/24 2242   12/03/24 2300  piperacillin -tazobactam (ZOSYN ) IVPB 3.375 g        3.375 g 12.5 mL/hr over 240 Minutes Intravenous Every 8 hours 12/03/24 2236     11/30/24 2130  vancomycin  (VANCOCIN ) IVPB 1000 mg/200 mL premix        1,000 mg 200 mL/hr over 60 Minutes Intravenous  Once 11/30/24 1609 11/30/24 2351   11/30/24 1615  ceFAZolin  (ANCEF ) IVPB 2g/100 mL premix        2 g 200 mL/hr over 30 Minutes Intravenous Every 8 hours 11/30/24 1609 12/02/24 0546   11/30/24 0400  vancomycin  (VANCOREADY) IVPB 1250 mg/250 mL        1,250 mg 166.7 mL/hr over 90 Minutes Intravenous To Surgery 11/29/24 1040 11/30/24 0856   11/30/24 0400  ceFAZolin  (ANCEF ) IVPB 2g/100 mL premix        2 g 200 mL/hr over 30 Minutes Intravenous To Surgery 11/29/24 1040 11/30/24 1239   11/30/24 0400  ceFAZolin  (ANCEF ) IVPB 2g/100 mL premix  Status:  Discontinued        2 g 200 mL/hr over 30 Minutes Intravenous To Surgery 11/29/24 1036 11/30/24 1609        Assessment/Plan: POD 5/3/1 status post ex lap with SBR & VAC placement, repeat laparotomy with patchy small bowel ischemia, end ileostomy/ab closure -heparin  fine today -woc consult -dressing changes to ab wound -would hold enteral feeds until has bowel function (either orally or via tube) -cont ng tube   Donnice Bury 12/09/2024

## 2024-12-10 ENCOUNTER — Encounter (HOSPITAL_COMMUNITY)
Admission: EM | Disposition: A | Discharge status: Rehabilitation Facility | Payer: Self-pay | Source: Home / Self Care | Attending: Surgery

## 2024-12-10 ENCOUNTER — Inpatient Hospital Stay (HOSPITAL_COMMUNITY): Admitting: Certified Registered Nurse Anesthetist

## 2024-12-10 ENCOUNTER — Inpatient Hospital Stay (HOSPITAL_COMMUNITY)

## 2024-12-10 DIAGNOSIS — I5021 Acute systolic (congestive) heart failure: Secondary | ICD-10-CM

## 2024-12-10 DIAGNOSIS — I3139 Other pericardial effusion (noninflammatory): Secondary | ICD-10-CM

## 2024-12-10 DIAGNOSIS — I110 Hypertensive heart disease with heart failure: Secondary | ICD-10-CM

## 2024-12-10 DIAGNOSIS — R509 Fever, unspecified: Secondary | ICD-10-CM

## 2024-12-10 DIAGNOSIS — E1165 Type 2 diabetes mellitus with hyperglycemia: Secondary | ICD-10-CM

## 2024-12-10 DIAGNOSIS — I38 Endocarditis, valve unspecified: Secondary | ICD-10-CM

## 2024-12-10 DIAGNOSIS — F17210 Nicotine dependence, cigarettes, uncomplicated: Secondary | ICD-10-CM | POA: Diagnosis not present

## 2024-12-10 DIAGNOSIS — J90 Pleural effusion, not elsewhere classified: Secondary | ICD-10-CM

## 2024-12-10 DIAGNOSIS — J9601 Acute respiratory failure with hypoxia: Secondary | ICD-10-CM

## 2024-12-10 DIAGNOSIS — J942 Hemothorax: Secondary | ICD-10-CM | POA: Diagnosis not present

## 2024-12-10 HISTORY — PX: VIDEO ASSISTED THORACOSCOPY (VATS)/DECORTICATION: SHX6171

## 2024-12-10 LAB — BASIC METABOLIC PANEL WITH GFR
Anion gap: 9 (ref 5–15)
Anion gap: 10 (ref 5–15)
Anion gap: 8 (ref 5–15)
BUN: 34 mg/dL — ABNORMAL HIGH (ref 6–20)
BUN: 36 mg/dL — ABNORMAL HIGH (ref 6–20)
BUN: 35 mg/dL — ABNORMAL HIGH (ref 6–20)
CO2: 30 mmol/L (ref 22–32)
CO2: 29 mmol/L (ref 22–32)
CO2: 29 mmol/L (ref 22–32)
Calcium: 8.9 mg/dL (ref 8.9–10.3)
Calcium: 8.6 mg/dL — ABNORMAL LOW (ref 8.9–10.3)
Calcium: 8.7 mg/dL — ABNORMAL LOW (ref 8.9–10.3)
Chloride: 108 mmol/L (ref 98–111)
Chloride: 108 mmol/L (ref 98–111)
Chloride: 108 mmol/L (ref 98–111)
Creatinine, Ser: 0.81 mg/dL (ref 0.61–1.24)
Creatinine, Ser: 0.84 mg/dL (ref 0.61–1.24)
Creatinine, Ser: 0.81 mg/dL (ref 0.61–1.24)
GFR, Estimated: >60 mL/min (ref >60)
GFR, Estimated: >60 mL/min (ref >60)
GFR, Estimated: >60 mL/min (ref >60)
Glucose, Bld: 137 mg/dL — ABNORMAL HIGH (ref 70–99)
Glucose, Bld: 114 mg/dL — ABNORMAL HIGH (ref 70–99)
Glucose, Bld: 135 mg/dL — ABNORMAL HIGH (ref 70–99)
Potassium: 3.5 mmol/L (ref 3.5–5.1)
Potassium: 3.6 mmol/L (ref 3.5–5.1)
Potassium: 3.8 mmol/L (ref 3.5–5.1)
Sodium: 146 mmol/L — ABNORMAL HIGH (ref 135–145)
Sodium: 147 mmol/L — ABNORMAL HIGH (ref 135–145)
Sodium: 145 mmol/L (ref 135–145)

## 2024-12-10 LAB — GLUCOSE, CAPILLARY
Glucose-Capillary: 124 mg/dL — ABNORMAL HIGH (ref 70–99)
Glucose-Capillary: 120 mg/dL — ABNORMAL HIGH (ref 70–99)
Glucose-Capillary: 132 mg/dL — ABNORMAL HIGH (ref 70–99)
Glucose-Capillary: 120 mg/dL — ABNORMAL HIGH (ref 70–99)
Glucose-Capillary: 137 mg/dL — ABNORMAL HIGH (ref 70–99)
Glucose-Capillary: 114 mg/dL — ABNORMAL HIGH (ref 70–99)

## 2024-12-10 LAB — COMPREHENSIVE METABOLIC PANEL WITH GFR
ALT: 58 U/L — ABNORMAL HIGH (ref 0–44)
AST: 155 U/L — ABNORMAL HIGH (ref 15–41)
Albumin: 2 g/dL — ABNORMAL LOW (ref 3.5–5.0)
Alkaline Phosphatase: 95 U/L (ref 38–126)
Anion gap: 9 (ref 5–15)
BUN: 34 mg/dL — ABNORMAL HIGH (ref 6–20)
CO2: 28 mmol/L (ref 22–32)
Calcium: 8.7 mg/dL — ABNORMAL LOW (ref 8.9–10.3)
Chloride: 109 mmol/L (ref 98–111)
Creatinine, Ser: 0.75 mg/dL (ref 0.61–1.24)
GFR, Estimated: >60 mL/min (ref >60)
Glucose, Bld: 126 mg/dL — ABNORMAL HIGH (ref 70–99)
Potassium: 4 mmol/L (ref 3.5–5.1)
Sodium: 146 mmol/L — ABNORMAL HIGH (ref 135–145)
Total Bilirubin: 3.2 mg/dL — ABNORMAL HIGH (ref 0.0–1.2)
Total Protein: 5.1 g/dL — ABNORMAL LOW (ref 6.5–8.1)

## 2024-12-10 LAB — POCT I-STAT 7, (LYTES, BLD GAS, ICA,H+H)
Acid-Base Excess: 4 mmol/L — ABNORMAL HIGH (ref 0.0–2.0)
Acid-Base Excess: 6 mmol/L — ABNORMAL HIGH (ref 0.0–2.0)
Acid-Base Excess: 3 mmol/L — ABNORMAL HIGH (ref 0.0–2.0)
Bicarbonate: 29.2 mmol/L — ABNORMAL HIGH (ref 20.0–28.0)
Bicarbonate: 30.3 mmol/L — ABNORMAL HIGH (ref 20.0–28.0)
Bicarbonate: 27.2 mmol/L (ref 20.0–28.0)
Calcium, Ion: 1.1 mmol/L — ABNORMAL LOW (ref 1.15–1.40)
Calcium, Ion: 1.21 mmol/L (ref 1.15–1.40)
Calcium, Ion: 1.1 mmol/L — ABNORMAL LOW (ref 1.15–1.40)
HCT: 32 % — ABNORMAL LOW (ref 39.0–52.0)
HCT: 27 % — ABNORMAL LOW (ref 39.0–52.0)
HCT: 27 % — ABNORMAL LOW (ref 39.0–52.0)
Hemoglobin: 10.9 g/dL — ABNORMAL LOW (ref 13.0–17.0)
Hemoglobin: 9.2 g/dL — ABNORMAL LOW (ref 13.0–17.0)
Hemoglobin: 9.2 g/dL — ABNORMAL LOW (ref 13.0–17.0)
O2 Saturation: 99 %
O2 Saturation: 99 %
O2 Saturation: 100 %
Patient temperature: 38.5
Potassium: 3.3 mmol/L — ABNORMAL LOW (ref 3.5–5.1)
Potassium: 3.8 mmol/L (ref 3.5–5.1)
Potassium: 3.8 mmol/L (ref 3.5–5.1)
Sodium: 142 mmol/L (ref 135–145)
Sodium: 145 mmol/L (ref 135–145)
Sodium: 144 mmol/L (ref 135–145)
TCO2: 31 mmol/L (ref 22–32)
TCO2: 32 mmol/L (ref 22–32)
TCO2: 28 mmol/L (ref 22–32)
pCO2 arterial: 45.2 mmHg (ref 32–48)
pCO2 arterial: 43.8 mmHg (ref 32–48)
pCO2 arterial: 36.8 mmHg (ref 32–48)
pH, Arterial: 7.418 (ref 7.35–7.45)
pH, Arterial: 7.454 — ABNORMAL HIGH (ref 7.35–7.45)
pH, Arterial: 7.476 — ABNORMAL HIGH (ref 7.35–7.45)
pO2, Arterial: 154 mmHg — ABNORMAL HIGH (ref 83–108)
pO2, Arterial: 122 mmHg — ABNORMAL HIGH (ref 83–108)
pO2, Arterial: 182 mmHg — ABNORMAL HIGH (ref 83–108)

## 2024-12-10 LAB — CBC
HCT: 27.9 % — ABNORMAL LOW (ref 39.0–52.0)
HCT: 27.9 % — ABNORMAL LOW (ref 39.0–52.0)
Hemoglobin: 9.6 g/dL — ABNORMAL LOW (ref 13.0–17.0)
Hemoglobin: 9.5 g/dL — ABNORMAL LOW (ref 13.0–17.0)
MCH: 30.6 pg (ref 26.0–34.0)
MCH: 30.8 pg (ref 26.0–34.0)
MCHC: 34.4 g/dL (ref 30.0–36.0)
MCHC: 34.1 g/dL (ref 30.0–36.0)
MCV: 88.9 fL (ref 80.0–100.0)
MCV: 90.6 fL (ref 80.0–100.0)
Platelets: 135 K/uL — ABNORMAL LOW (ref 150–400)
Platelets: 158 K/uL (ref 150–400)
RBC: 3.14 MIL/uL — ABNORMAL LOW (ref 4.22–5.81)
RBC: 3.08 MIL/uL — ABNORMAL LOW (ref 4.22–5.81)
RDW: 18.2 % — ABNORMAL HIGH (ref 11.5–15.5)
RDW: 18.3 % — ABNORMAL HIGH (ref 11.5–15.5)
WBC: 17.1 K/uL — ABNORMAL HIGH (ref 4.0–10.5)
WBC: 18.7 K/uL — ABNORMAL HIGH (ref 4.0–10.5)
nRBC: 0.5 % — ABNORMAL HIGH (ref 0.0–0.2)
nRBC: 0.4 % — ABNORMAL HIGH (ref 0.0–0.2)

## 2024-12-10 LAB — HEPARIN LEVEL (UNFRACTIONATED): Heparin Unfractionated: <0.1 [IU]/mL — ABNORMAL LOW (ref 0.30–0.70)

## 2024-12-10 LAB — COOXEMETRY PANEL
Carboxyhemoglobin: 2.3 % — ABNORMAL HIGH (ref 0.5–1.5)
Methemoglobin: <0.7 % (ref 0.0–1.5)
O2 Saturation: 69.2 %
Total hemoglobin: 10.5 g/dL — ABNORMAL LOW (ref 12.0–16.0)

## 2024-12-10 LAB — SURGICAL PATHOLOGY

## 2024-12-10 LAB — MAGNESIUM
Magnesium: 1.6 mg/dL — ABNORMAL LOW (ref 1.7–2.4)
Magnesium: 2.7 mg/dL — ABNORMAL HIGH (ref 1.7–2.4)
Magnesium: 2 mg/dL (ref 1.7–2.4)
Magnesium: 1.7 mg/dL (ref 1.7–2.4)

## 2024-12-10 LAB — VANCOMYCIN, TROUGH: Vancomycin Tr: 12 ug/mL — ABNORMAL LOW (ref 15–20)

## 2024-12-10 LAB — LACTATE DEHYDROGENASE: LDH: 715 U/L — ABNORMAL HIGH (ref 105–235)

## 2024-12-10 LAB — VANCOMYCIN, PEAK: Vancomycin Pk: 28 ug/mL — ABNORMAL LOW (ref 30–40)

## 2024-12-10 LAB — TRIGLYCERIDES: Triglycerides: 212 mg/dL — ABNORMAL HIGH (ref <150)

## 2024-12-10 LAB — PHOSPHORUS: Phosphorus: 3.7 mg/dL (ref 2.5–4.6)

## 2024-12-10 MED ORDER — ACETAMINOPHEN 10 MG/ML IV SOLN
1000.0000 mg | Freq: Four times a day (QID) | INTRAVENOUS | Status: AC
  Administered 2024-12-10 – 2024-12-11 (×4): 1000 mg via INTRAVENOUS
  Filled 2024-12-10 (×4): qty 100

## 2024-12-10 MED ORDER — POTASSIUM CHLORIDE 10 MEQ/50ML IV SOLN
10.0000 meq | INTRAVENOUS | Status: AC
  Administered 2024-12-10 (×3): 10 meq via INTRAVENOUS
  Filled 2024-12-10 (×3): qty 50

## 2024-12-10 MED ORDER — FUROSEMIDE 10 MG/ML IJ SOLN
80.0000 mg | Freq: Once | INTRAMUSCULAR | Status: AC
  Administered 2024-12-10: 08:00:00 80 mg via INTRAVENOUS
  Filled 2024-12-10: qty 8

## 2024-12-10 MED ORDER — MAGNESIUM SULFATE 4 GM/100ML IV SOLN
4.0000 g | Freq: Once | INTRAVENOUS | Status: AC
  Administered 2024-12-10: 04:00:00 4 g via INTRAVENOUS
  Filled 2024-12-10: qty 100

## 2024-12-10 MED ORDER — POTASSIUM CHLORIDE 10 MEQ/50ML IV SOLN
10.0000 meq | INTRAVENOUS | Status: AC
  Administered 2024-12-10 – 2024-12-11 (×3): 10 meq via INTRAVENOUS
  Filled 2024-12-10 (×3): qty 50

## 2024-12-10 MED ORDER — POTASSIUM CHLORIDE 10 MEQ/50ML IV SOLN
10.0000 meq | INTRAVENOUS | Status: AC
  Administered 2024-12-10 (×3): 10 meq via INTRAVENOUS
  Filled 2024-12-10: qty 50

## 2024-12-10 MED ORDER — EPINEPHRINE 1 MG/10ML IV SOSY
PREFILLED_SYRINGE | INTRAVENOUS | Status: DC | PRN
  Administered 2024-12-10: 19:00:00 10 ug via INTRAVENOUS
  Administered 2024-12-10: 18:00:00 5 ug via INTRAVENOUS
  Administered 2024-12-10 (×2): 10 ug via INTRAVENOUS
  Administered 2024-12-10: 19:00:00 5 ug via INTRAVENOUS

## 2024-12-10 MED ORDER — ETOMIDATE 2 MG/ML IV SOLN: 20.0000 mg | Freq: Once | INTRAVENOUS | Status: AC

## 2024-12-10 MED ORDER — TRACE MINERALS CU-MN-SE-ZN 300-55-60-3000 MCG/ML IV SOLN
INTRAVENOUS | Status: AC
  Filled 2024-12-10: qty 875.53

## 2024-12-10 MED ORDER — 0.9 % SODIUM CHLORIDE (POUR BTL) OPTIME
TOPICAL | Status: DC | PRN
  Administered 2024-12-10: 19:00:00 1000 mL

## 2024-12-10 MED ORDER — PROPOFOL 1000 MG/100ML IV EMUL
0.0000 ug/kg/min | INTRAVENOUS | Status: DC
  Administered 2024-12-10: 22:00:00 20 ug/kg/min via INTRAVENOUS
  Administered 2024-12-10: 08:00:00 5 ug/kg/min via INTRAVENOUS
  Administered 2024-12-11: 40 ug/kg/min via INTRAVENOUS
  Filled 2024-12-10 (×4): qty 100

## 2024-12-10 MED ORDER — POTASSIUM CHLORIDE 10 MEQ/50ML IV SOLN: 10.0000 meq | INTRAVENOUS | Status: DC

## 2024-12-10 MED ORDER — FUROSEMIDE 10 MG/ML IJ SOLN
80.0000 mg | Freq: Two times a day (BID) | INTRAMUSCULAR | Status: DC
  Administered 2024-12-10 – 2024-12-12 (×4): 80 mg via INTRAVENOUS
  Filled 2024-12-10 (×4): qty 8

## 2024-12-10 MED ORDER — LACTATED RINGERS IV SOLN: INTRAVENOUS | Status: DC | PRN

## 2024-12-10 MED ORDER — ETOMIDATE 2 MG/ML IV SOLN
INTRAVENOUS | Status: AC
  Administered 2024-12-10: 12:00:00 20 mg via INTRAVENOUS
  Filled 2024-12-10: qty 10

## 2024-12-10 MED ORDER — HEPARIN (PORCINE) 25000 UT/250ML-% IV SOLN
1800.0000 [IU]/h | INTRAVENOUS | Status: DC
  Administered 2024-12-11: 800 [IU]/h via INTRAVENOUS
  Administered 2024-12-12: 1200 [IU]/h via INTRAVENOUS
  Administered 2024-12-13: 1400 [IU]/h via INTRAVENOUS
  Administered 2024-12-14: 1500 [IU]/h via INTRAVENOUS
  Administered 2024-12-15: 1650 [IU]/h via INTRAVENOUS
  Filled 2024-12-10 (×5): qty 250

## 2024-12-10 MED ORDER — MAGNESIUM SULFATE 2 GM/50ML IV SOLN
2.0000 g | Freq: Once | INTRAVENOUS | Status: AC
  Administered 2024-12-10: 23:00:00 2 g via INTRAVENOUS
  Filled 2024-12-10: qty 50

## 2024-12-10 MED ORDER — ACETAMINOPHEN 10 MG/ML IV SOLN
1000.0000 mg | Freq: Four times a day (QID) | INTRAVENOUS | Status: AC
  Administered 2024-12-11 – 2024-12-12 (×4): 1000 mg via INTRAVENOUS
  Filled 2024-12-10 (×4): qty 100

## 2024-12-10 MED ADMIN — Rocuronium Bromide IV Soln Pref Syr 100 MG/10ML (10 MG/ML): 50 mg | INTRAVENOUS | @ 19:00:00 | NDC 99999070048

## 2024-12-10 MED ADMIN — PROPOFOL 200 MG/20ML IV EMUL: 75 mg | INTRAVENOUS | @ 18:00:00 | NDC 00069020910

## 2024-12-10 MED ADMIN — Rocuronium Bromide IV Soln Pref Syr 100 MG/10ML (10 MG/ML): 100 mg | INTRAVENOUS | @ 18:00:00 | NDC 99999070048

## 2024-12-10 MED ADMIN — Succinylcholine Chloride Sol Pref Syr 200 MG/10ML (20 MG/ML): 50 mg | INTRAVENOUS | @ 20:00:00 | NDC 99999070035

## 2024-12-10 SURGICAL SUPPLY — 2 items
NDL 18GX1X1/2 (RX/OR ONLY) (NEEDLE) ×1 IMPLANT
NDL HYPO 22X1.5 SAFETY MO (MISCELLANEOUS) ×1 IMPLANT

## 2024-12-10 NOTE — Progress Notes (Signed)
 2 Days Post-Op   Subjective/Chief Complaint:  intubated, sedated, febrile, remains off of pressors   Objective: Vital signs in last 24 hours: Temp:  [99.9 F (37.7 C)-101.8 F (38.8 C)] 100.9 F (38.3 C) (12/18 0835) Pulse Rate:  [62-110] 82 (12/18 0835) Resp:  [0-28] 14 (12/18 0835) BP: (86-108)/(66-88) 94/79 (12/18 0800) SpO2:  [95 %-100 %] 97 % (12/18 0835) Arterial Line BP: (85-141)/(53-85) 117/70 (12/18 0835) FiO2 (%):  [40 %] 40 % (12/18 0835) Weight:  [81.3 kg] 81.3 kg (12/18 0500) Last BM Date : 12/10/24  Intake/Output from previous day: 12/17 0701 - 12/18 0700 In: 4966.7 [I.V.:3072.5; IV Piggyback:1531.1] Out: 5831 [Urine:5120; Emesis/NG output:400; Stool:175; Chest Tube:136] Intake/Output this shift: Total I/O In: 289.8 [I.V.:260; Other:12.9; IV Piggyback:16.9] Out: 135 [Urine:125; Stool:10]  Ab wound open w/ fascia in tact, ileostomy with minimal oozing, pink, some edema - some SS output but no stool NGT - >500 mL light brown effluent GU: flexiseal in place draining non-bloody liquid stool  Lab Results:  Recent Labs    12/09/24 1447 12/10/24 0614 12/10/24 0617  WBC 17.2*  --  17.1*  HGB 10.4* 9.2* 9.6*  HCT 30.3* 27.0* 27.9*  PLT 108*  --  135*   BMET Recent Labs    12/10/24 0029 12/10/24 0614 12/10/24 0617  NA 146* 145 146*  K 3.5 3.8 4.0  CL 108  --  109  CO2 30  --  28  GLUCOSE 137*  --  126*  BUN 34*  --  34*  CREATININE 0.81  --  0.75  CALCIUM  8.9  --  8.7*   PT/INR No results for input(s): LABPROT, INR in the last 72 hours. ABG Recent Labs    12/09/24 0345 12/10/24 0614  PHART 7.396 7.454*  HCO3 29.5* 30.3*    Studies/Results: VAS US  LOWER EXTREMITY VENOUS (DVT) Result Date: 12/09/2024  Lower Venous DVT Study Patient Name:  Alejandro Lopez  Date of Exam:   12/09/2024 Medical Rec #: [id number]         Accession #:    [id number] Date of Birth: 02-Jul-1981        Patient Gender: M Patient Age:   43 years Exam Location:  [facility] Procedure:      VAS US  LOWER EXTREMITY VENOUS (DVT) Referring Phys: TORIBIO SHARPS --------------------------------------------------------------------------------  Indications: Edema.  Risk Factors: None identified. Limitations: Poor ultrasound/tissue interface and patient positioning, patient immobility. Comparison Study: No prior studies. Performing Technologist: Cordella Collet RVT  Examination Guidelines: A complete evaluation includes B-mode imaging, spectral Doppler, color Doppler, and power Doppler as needed of all accessible portions of each vessel. Bilateral testing is considered an integral part of a complete examination. Limited examinations for reoccurring indications may be performed as noted. The reflux portion of the exam is performed with the patient in reverse Trendelenburg.  +---------+---------------+---------+-----------+----------+--------------+ RIGHT    CompressibilityPhasicitySpontaneityPropertiesThrombus Aging +---------+---------------+---------+-----------+----------+--------------+ CFV      Full           Yes      Yes                                 +---------+---------------+---------+-----------+----------+--------------+ SFJ      Full                                                        +---------+---------------+---------+-----------+----------+--------------+  FV Prox  Full                                                        +---------+---------------+---------+-----------+----------+--------------+ FV Mid   Full                                                        +---------+---------------+---------+-----------+----------+--------------+ FV DistalFull                                                        +---------+---------------+---------+-----------+----------+--------------+ PFV      Full                                                         +---------+---------------+---------+-----------+----------+--------------+ POP      Full           Yes      Yes                                 +---------+---------------+---------+-----------+----------+--------------+ PTV      Full                                                        +---------+---------------+---------+-----------+----------+--------------+ PERO     Full                                                        +---------+---------------+---------+-----------+----------+--------------+   +---------+---------------+---------+-----------+----------+--------------+ LEFT     CompressibilityPhasicitySpontaneityPropertiesThrombus Aging +---------+---------------+---------+-----------+----------+--------------+ CFV      Full           Yes      Yes                                 +---------+---------------+---------+-----------+----------+--------------+ SFJ      Full                                                        +---------+---------------+---------+-----------+----------+--------------+ FV Prox  Full                                                        +---------+---------------+---------+-----------+----------+--------------+  FV Mid   Full                                                        +---------+---------------+---------+-----------+----------+--------------+ FV Distal               Yes      Yes                                 +---------+---------------+---------+-----------+----------+--------------+ PFV      Full                                                        +---------+---------------+---------+-----------+----------+--------------+ POP      Full           Yes      Yes                                 +---------+---------------+---------+-----------+----------+--------------+ PTV      Full                                                         +---------+---------------+---------+-----------+----------+--------------+ PERO     Full                                                        +---------+---------------+---------+-----------+----------+--------------+     Summary: RIGHT: - There is no evidence of deep vein thrombosis in the lower extremity. However, portions of this examination were limited- see technologist comments above.  - No cystic structure found in the popliteal fossa.  LEFT: - There is no evidence of deep vein thrombosis in the lower extremity. However, portions of this examination were limited- see technologist comments above.  - No cystic structure found in the popliteal fossa.  *See table(s) above for measurements and observations. Electronically signed by Gaile New MD on 12/09/2024 at 8:42:38 PM.    Final    ECHOCARDIOGRAM LIMITED Result Date: 12/09/2024    ECHOCARDIOGRAM LIMITED REPORT   Patient Name:   Alejandro Lopez Date of Exam: 12/09/2024 Medical Rec #:  [id number]        Height:       70.0 in Accession #:    [id number]       Weight:       179.7 lb Date of Birth:  27-Oct-1981       BSA:          1.994 m Patient Age:    43 years         BP:           110/88 mmHg Patient Gender: M  HR:           98 bpm. Exam Location:  Inpatient Procedure: Limited Echo (Both Spectral and Color Flow Doppler were utilized            during procedure). Indications:    Impella Support  History:        Patient has prior history of Echocardiogram examinations, most                 recent 12/08/2024.  Sonographer:    Jayson Gaskins Referring Phys: [phone] ALMA L DIAZ IMPRESSIONS  1. Left ventricular ejection fraction, by estimation, is 25 to 30%. The left ventricle has severely decreased function. The left ventricle demonstrates global hypokinesis.  2. Right ventricular systolic function is moderately reduced.  3. Moderate pericardial effusion. The pericardial effusion is circumferential.  4. The mitral valve has been  repaired/replaced.  5. The aortic valve has been repaired/replaced. Conclusion(s)/Recommendation(s): Limited echo to assess Impell position. Impella well postioned with inflow cage 5.27 cm from AoV. FINDINGS  Left Ventricle: Left ventricular ejection fraction, by estimation, is 25 to 30%. The left ventricle has severely decreased function. The left ventricle demonstrates global hypokinesis. Right Ventricle: Right ventricular systolic function is moderately reduced. Pericardium: A moderately sized pericardial effusion is present. The pericardial effusion is circumferential. The pericardial effusion appears to contain mixed echogenic material. Mitral Valve: The mitral valve has been repaired/replaced. There is a bioprosthetic valve present in the mitral position. Aortic Valve: The aortic valve has been repaired/replaced. There is a bioprosthetic valve present in the aortic position. Toribio Fuel MD Electronically signed by Toribio Fuel MD Signature Date/Time: 12/09/2024/7:20:05 PM    Final    DG CHEST PORT 1 VIEW Result Date: 12/09/2024 EXAM: 1 VIEW(S) XRAY OF THE CHEST 12/09/2024 07:12:48 AM COMPARISON: 12/08/2024 CLINICAL HISTORY: Status post cardiac surgery FINDINGS: LINES, TUBES AND DEVICES: Impella device in place with distal tip over the left heart. Left IJ arterial catheter in place with tip projecting over the right artery. Enteric tube in place with tip and side port in the stomach. Endotracheal tube in place with tip 6 cm above the carina. Right chest tube in place. LUNGS AND PLEURA: Increased hazy opacity in the right mid to lower lung, likely a combination of layering pleural fluid and atelectasis. No pneumothorax. 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 Stable cardiomegaly. BONES AND SOFT TISSUES: No acute osseous abnormality. IMPRESSION: 1. Increased hazy opacity in the right mid to lower lung, likely a combination of layering pleural fluid  and atelectasis. 2. 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 3. Impella device, left IJ arterial catheter, enteric tube, endotracheal tube, and right chest tube in place. Electronically signed by: Evalene Coho MD 12/09/2024 11:23 AM EST RP Workstation: [id number]   CT CHEST ABDOMEN PELVIS WO CONTRAST Result Date: 12/09/2024 EXAM: CT CHEST, ABDOMEN AND PELVIS WITHOUT CONTRAST 12/09/2024 10:52:00 AM TECHNIQUE: CT of the chest, abdomen and pelvis was performed without the administration of intravenous contrast. Multiplanar reformatted images are provided for review. Automated exposure control, iterative reconstruction, and/or weight based adjustment of the mA/kV was utilized to reduce the radiation dose to as low as reasonably achievable. COMPARISON: CT of the chest, abdomen and pelvis dated 12/03/2024. CLINICAL HISTORY: evaluate right hemothorax evaluate right hemothorax FINDINGS: CHEST: MEDIASTINUM AND LYMPH NODES: Heart is enlarged and the patient is status post aortic and mitral valvular replacement. An Impella device is again noted within the left ventricle. A  Swan-Ganz catheter is also present with its tip in the right pulmonary artery. There is a moderate pericardial effusion, which has increased in size since the previous study. An endotracheal tube is in good position above the carina. No mediastinal, hilar or axillary lymphadenopathy. LUNGS AND PLEURA: Consolidation/atelectasis present dependently within the right lung. The right lower lobe is nearly completely opacified. There are biapical pulmonary blebs. A right-sided pigtail chest catheter remains in place. There is a small volume of pleural fluid. No pneumothorax. ABDOMEN AND PELVIS: LIVER: The liver is unremarkable. GALLBLADDER AND BILE DUCTS: Radiopaque material layering dependently within the gallbladder. No biliary ductal dilatation. SPLEEN: No acute abnormality. PANCREAS: No acute abnormality.  ADRENAL GLANDS: No acute abnormality. KIDNEYS, URETERS AND BLADDER: No stones in the kidneys or ureters. No hydronephrosis. No perinephric or periureteral stranding. Foley catheter is present within the urinary bladder. GI AND BOWEL: Stomach demonstrates no acute abnormality. An enteric catheter has been placed and its tip is in the distal stomach. The patient is status post right colectomy. 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 There is no bowel obstruction. REPRODUCTIVE ORGANS: No acute abnormality. PERITONEUM AND RETROPERITONEUM: No ascites. No free air. VASCULATURE: Aorta is normal in caliber. ABDOMINAL AND PELVIS LYMPH NODES: No lymphadenopathy. BONES AND SOFT TISSUES: Patient is status post sternotomy. A right arm PICC is present with its tip at the superior atrial caval junction. There is moderate soft tissue edema within the subcutaneous fat of the abdomen and pelvis. No acute osseous abnormality. IMPRESSION: 1. Small residual right pleural fluid/hemothorax with right-sided pigtail chest catheter in place and dependent right lung consolidation/atelectasis with near complete right lower lobe opacification. 2. Moderate pericardial effusion, increased in size since the previous study. 3. Status post aortic and mitral valvular replacement with Impella device in the left ventricle and Swan-Ganz catheter in the right pulmonary artery. Additional support devices include endotracheal tube, enteric tube with tip in the distal stomach, and right upper extremity PICC with tip at the superior cavoatrial junction. 4. Status post right colectomy with right periumbilical ileostomy. 5. Radiopaque material layering within the gallbladder, which can be seen with vicarious excretion of contrast or cholelithiasis. 6. Moderate subcutaneous soft tissue edema of the abdomen and pelvis. Electronically signed by: Evalene Coho MD 12/09/2024 11:21 AM EST RP Workstation: [id number]   ECHOCARDIOGRAM LIMITED Result Date:  12/08/2024    ECHOCARDIOGRAM LIMITED REPORT   Patient Name:   Alejandro Lopez Date of Exam: 12/08/2024 Medical Rec #:  [id number]        Height:       70.0 in Accession #:    [id number]       Weight:       180.3 lb Date of Birth:  09/24/1981       BSA:          1.998 m Patient Age:    43 years         BP:           110/82 mmHg Patient Gender: M                HR:           102 bpm. Exam Location:  Inpatient Procedure: Limited Echo (Both Spectral and Color Flow Doppler were utilized            during procedure). Indications:    Impella Check  History:        Patient has prior history of Echocardiogram examinations, most  recent 12/07/2024. CHF.  Sonographer:    Jayson Gaskins Referring Phys: [phone] DALTON S MCLEAN IMPRESSIONS  1. Bioprosthetic mitral valve replacement, without Doppler assessment.  2. Limited study for Impella cannula position.  3. Moderate to large circumferential pericardial effusion, with fibrinous material.  4. 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 Global left ventricular hypokinesis, paradoxical septal motion, LVEF approximately 30%. There is a catheter/device lead in the right ventricle. Comparison(s): No significant change from prior study. Prior images reviewed side by side. FINDINGS  Left Ventricle: Jerel Balding MD Electronically signed by Jerel Balding MD Signature Date/Time: 12/08/2024/11:15:03 AM    Final     Anti-infectives: Anti-infectives (From admission, onward)    Start     Dose/Rate Route Frequency Ordered Stop   12/10/24 1000  micafungin  (MYCAMINE ) 100 mg in sodium chloride  0.9 % 100 mL IVPB       Placed in Followed by Linked Group   100 mg 105 mL/hr over 1 Hours Intravenous Every 24 hours 12/09/24 0704     12/09/24 0830  micafungin  (MYCAMINE ) 200 mg in sodium chloride  0.9 % 100 mL IVPB       Placed in Followed by Linked Group   200 mg 110 mL/hr over 1 Hours Intravenous  Once 12/09/24 0704 12/09/24 1101   12/06/24 2200  vancomycin  (VANCOREADY)  IVPB 1250 mg/250 mL        1,250 mg 166.7 mL/hr over 90 Minutes Intravenous Every 12 hours 12/06/24 0811     12/06/24 0900  vancomycin  (VANCOREADY) IVPB 1750 mg/350 mL        1,750 mg 175 mL/hr over 120 Minutes Intravenous  Once 12/06/24 0802 12/06/24 1032   12/04/24 1100  vancomycin  (VANCOCIN ) IVPB 1000 mg/200 mL premix  Status:  Discontinued        1,000 mg 200 mL/hr over 60 Minutes Intravenous Every 12 hours 12/03/24 2238 12/03/24 2242   12/04/24 0000  vancomycin  (VANCOREADY) IVPB 1500 mg/300 mL  Status:  Discontinued        1,500 mg 150 mL/hr over 120 Minutes Intravenous  Once 12/03/24 2236 12/03/24 2242   12/03/24 2300  piperacillin -tazobactam (ZOSYN ) IVPB 3.375 g        3.375 g 12.5 mL/hr over 240 Minutes Intravenous Every 8 hours 12/03/24 2236     11/30/24 2130  vancomycin  (VANCOCIN ) IVPB 1000 mg/200 mL premix        1,000 mg 200 mL/hr over 60 Minutes Intravenous  Once 11/30/24 1609 11/30/24 2351   11/30/24 1615  ceFAZolin  (ANCEF ) IVPB 2g/100 mL premix        2 g 200 mL/hr over 30 Minutes Intravenous Every 8 hours 11/30/24 1609 12/02/24 0546   11/30/24 0400  vancomycin  (VANCOREADY) IVPB 1250 mg/250 mL        1,250 mg 166.7 mL/hr over 90 Minutes Intravenous To Surgery 11/29/24 1040 11/30/24 0856   11/30/24 0400  ceFAZolin  (ANCEF ) IVPB 2g/100 mL premix        2 g 200 mL/hr over 30 Minutes Intravenous To Surgery 11/29/24 1040 11/30/24 1239   11/30/24 0400  ceFAZolin  (ANCEF ) IVPB 2g/100 mL premix  Status:  Discontinued        2 g 200 mL/hr over 30 Minutes Intravenous To Surgery 11/29/24 1036 11/30/24 1609       Assessment/Plan: POD 6/4/2 status post ex lap with SBR & VAC placement, repeat laparotomy with patchy small bowel ischemia, end ileostomy/ab closure -CT Chest/abd/pelvis yesterday w/ post-op changes of partial colectomy/ileostomy; R pleural fluid and pericardian effusion, noted  probably going to OR later today with TCTS for washout of the chest. -woc consult - needs  ileostomy pouch change (leaking bowel sweat) and placement of midline wound VAC if possible given close proximity to ileostomy -would hold enteral feeds until has bowel function (and for OR today) -cont ng tube   Almarie GORMAN Pringle 12/10/2024

## 2024-12-10 NOTE — Progress Notes (Addendum)
 ANTICOAGULATION CONSULT NOTE  Pharmacy Consult for heparin  Indication: bA/MVR  Allergies[1]  Patient Measurements: Height: 5' 10 (177.8 cm) Weight: 81.3 kg (179 lb 3.7 oz) IBW/kg (Calculated) : 73 Heparin  Dosing Weight: 70 kg   Vital Signs: Temp: 101.5 F (38.6 C) (12/18 0700) Temp Source: Core (12/18 0400) BP: 101/80 (12/18 0700) Pulse Rate: 82 (12/18 0700)  Labs: Recent Labs    12/09/24 0337 12/09/24 0345 12/09/24 1447 12/10/24 0029 12/10/24 0614 12/10/24 0617  HGB 11.2*   < > 10.4*  --  9.2* 9.6*  HCT 32.4*   < > 30.3*  --  27.0* 27.9*  PLT 102*  --  108*  --   --  135*  HEPARINUNFRC  --   --  <0.10*  --   --   --   CREATININE  --    < > 0.87 0.81  --  0.75   < > = values in this interval not displayed.    Estimated Creatinine Clearance: 120.4 mL/min (by C-G formula based on SCr of 0.75 mg/dL).  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  Postop course complicated by ischemic bowel s/p exlap 12/11 with partial small bowel resection, abdomen left open.  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   Per Dr. Lucas, plan for 3 months of warfarin therapy with dual valve replacement. Not on anticoagulation prior to admission.  Pharmacy consulted for heparin  dosing.  After discussing with CCM, HF, and CCS, fixed heparin  500 units/hr (no titrations) started 12/17.   HL undetectable as expected.  Hgb down, pltc up, LDH trending down.  Going to OR for chest washout today.  Goal of Therapy:  HL <0.1 Monitor platelets by anticoagulation protocol: Yes   Plan:  Continue heparin  IV 500 units/h - no titrations F/u restart after OR Monitor ostomy output, bleeding  Maurilio Fila, PharmD Clinical  Pharmacist 12/10/2024  7:07 AM     [1] No Known Allergies

## 2024-12-10 NOTE — Anesthesia Preprocedure Evaluation (Addendum)
 Anesthesia Evaluation  Patient identified by MRN, date of birth, ID band  Reviewed: Allergy & Precautions, NPO status , Patient's Chart, lab work & pertinent test results  History of Anesthesia Complications Negative for: history of anesthetic complications  Airway        Dental   Pulmonary asthma , Current Smoker and Patient abstained from smoking. 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 Left ventricular ejection fraction, by estimation, is 25 to 30%. The  left ventricle has severely decreased function. The left ventricle  demonstrates global hypokinesis.   2. Right ventricular systolic function is moderately reduced.   3. Moderate pericardial effusion. The pericardial effusion is  circumferential.   4. The mitral valve has been repaired/replaced.   5. The aortic valve has been repaired/replaced.   Conclusion(s)/Recommendation(s): Limited echo to assess Impell position.  Impella well postioned with inflow cage 5.27 cm from AoV.     Neuro/Psych negative neurological ROS  negative psych ROS   GI/Hepatic Ischemic Bowel s/p Resection with Open Abdomen   Endo/Other    Renal/GU Renal InsufficiencyRenal disease  negative genitourinary   Musculoskeletal   Abdominal   Peds  Hematology  (+) Blood dyscrasia Lab Results      Component                Value               Date                      WBC                      17.1 (H)            12/10/2024                HGB                      9.6 (L)             12/10/2024                HCT                      27.9 (L)            12/10/2024                MCV                      88.9                12/10/2024                PLT                      135 (L)             12/10/2024              Lab Results      Component                Value               Date  NA                       147 (H)             12/10/2024                K                        3.6                 12/10/2024                CO2                      29                  12/10/2024                GLUCOSE                  114 (H)             12/10/2024                BUN                      36 (H)              12/10/2024                CREATININE               0.84                12/10/2024                CALCIUM                   8.6 (L)             12/10/2024                GFRNONAA                 >60                 12/10/2024              Anesthesia Other Findings   Reproductive/Obstetrics                              Anesthesia Physical Anesthesia Plan  ASA: 5  Anesthesia Plan: General   Post-op Pain Management:    Induction: Intravenous  PONV Risk Score and Plan: 1 and Treatment may vary due to age or medical condition  Airway Management Planned: Oral ETT, Double Lumen EBT and Fiberoptic Intubation Planned  Additional Equipment: Arterial line and CVP  Intra-op Plan:   Post-operative Plan: Post-operative intubation/ventilation  Informed Consent: I have reviewed the patients History and Physical, chart, labs and discussed the procedure including the risks, benefits and alternatives for the proposed anesthesia with the patient or authorized representative who has indicated his/her understanding and acceptance.     Dental advisory given  Plan Discussed with: CRNA and Surgeon  Anesthesia Plan Comments: (- DLT vs BB )         Anesthesia Quick Evaluation

## 2024-12-10 NOTE — Consult Note (Addendum)
 WOC Nurse Consult Note: Reason for Consult: Requested to apply NPWT in midline wound. Wound type: surgical Pressure Injury POA: NA Measurement: 14.5 cm x 4 cm x 3 cm Wound bed: 80% yellow, partial necrotic fat tissue, 10% black tissue, 10% pale red non granulating. Drainage (amount, consistency, odor) Minimum amount, serous, no odor. Periwound: intact, ileostomy on right side.   Dressing procedure/placement/frequency:  Removed old NPWT dressing Cleansed wound with normal saline Filled wound with 1 piece of black foam  Sealed NPWT dressing at HG Patient received IV pain medication per bedside nurse prior to dressing change Patient is intubated, tolerated procedure well.  WOC nurse will continue to provide NPWT dressing changed due to the complexity of the dressing change MON/THURS.   Supplies in room.   WOC team will follow MON. Please reconsult if further assistance is needed. Thank-you,  Lela Holm MSN, RN, CWCN, CNS.  (Phone [phone])

## 2024-12-10 NOTE — Progress Notes (Signed)
 Pharmacy Electrolyte Replacement  Recent Labs:  Recent Labs    12/10/24 0617 12/10/24 1426 12/10/24 2118  K 4.0   < > 3.8  MG 2.7*   < > 1.7  PHOS 3.7  --   --   CREATININE 0.75   < > 0.81   < > = values in this interval not displayed.    K = 3.8 and Mg = 1.7  MD Contacted: [place]  Plan: Kcl 10 mEq IV x3 + Mg 2g IV x1  Lynwood Poplar, PharmD, BCPS Clinical Pharmacist 12/10/2024 10:44 PM

## 2024-12-10 NOTE — Progress Notes (Addendum)
 Patient ID: Alejandro Lopez, male   DOB: 1981-06-02, 43 y.o.   MRN: [id number]     Advanced Heart Failure Rounding Note  Cardiologist: None  Chief Complaint: Valvular Heart Disease & HFrEF Patient Profile  43 y.o. male w/ h/o heavy alcohol use (at least 5 drinks per evening) and h/o asthma admitted w/ acute systolic heart failure. Strong family history of cardiac disease: mother and maternal grandmother had heart failure and father with CAD. Admitted with acute HFrEF/cardiogenic shock.   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  12/9 VT/VF ? vagal episode. Extubated 12/11: S/p R thoracentesis with resultant hemothorax.  Chest tube placed. S/p R intercostal artery coil (IR). S/p exp lap for ischemic bowel resection, bowel left in discontinuity 12/15: Back to OR for ischemic R colon and mesenteric ischemia. S/p exp/lap, wound vac exchange, R colectomy without anastomosis.  12/16 Back to OR for small bowel resection and ileostomy   Subjective:    Impella 5.5  Flow (Liters/min): 3.9 Liters/min Performance Level: P7 Recent Labs    12/08/24 0410 12/09/24 0337 12/10/24 0617  LDH 444* 801* 715*    Heparin  gtt started 12/17.  - Plts 135, Hgb 9.6  Remains on 0.25 milrinone , low dose NE off and on with sedation.  Swan: TD CO 7 TD CI 3.8 PA 33/16 CVP 6 Co-ox 69%  Remains febrile, T max 101.5 F. On Zosyn  + vancomycin . Micafungin  added 12/17 for candida albicans in trach aspirate.   Possible OR today for VATS washout and drainage of pericardial effusion.  Remains intubated. Wakes up and follows commands.    Objective:   Weight Range: 81.3 kg Body mass index is 25.72 kg/m.   Vital Signs:   Temp:  [99.9 F (37.7 C)-101.8 F (38.8 C)] 101.5 F (38.6 C) (12/18 0700) Pulse Rate:  [62-116] 82 (12/18 0700) Resp:  [0-28] 22 (12/18 0700) BP: (86-114)/(66-88) 101/80 (12/18 0700) SpO2:  [95 %-100 %] 97 % (12/18 0700) Arterial Line BP:  (85-141)/(53-89) 110/63 (12/18 0700) FiO2 (%):  [40 %] 40 % (12/18 0400) Weight:  [81.3 kg] 81.3 kg (12/18 0500) Last BM Date : 12/09/24  Weight change: Filed Weights   12/08/24 0500 12/09/24 0420 12/10/24 0500  Weight: 81.8 kg 81.5 kg 81.3 kg    Intake/Output:   Intake/Output Summary (Last 24 hours) at 12/10/2024 0704 Last data filed at 12/10/2024 0700 Gross per 24 hour  Intake 4966.68 ml  Output 5831 ml  Net -864.32 ml     Physical Exam   General:  Critically ill appearing. No distress.  Cor: Regular rate & rhythm. No murmurs. R axillary Impella. Lungs: Remains intubated. Lung sounds diminished, R >L Abdomen: + ostomy Extremities: 1+ diffuse edema Neuro: Awake and following commands.    Telemetry   SR 80s Labs    CBC Recent Labs    12/09/24 1447 12/10/24 0614 12/10/24 0617  WBC 17.2*  --  17.1*  HGB 10.4* 9.2* 9.6*  HCT 30.3* 27.0* 27.9*  MCV 89.6  --  88.9  PLT 108*  --  135*   Basic Metabolic Panel Recent Labs    87/82/74 0403 12/09/24 1447 12/10/24 0029 12/10/24 0614 12/10/24 0617  NA 146*   < > 146* 145 146*  K 3.8   < > 3.5 3.8 4.0  CL 108   < > 108  --  109  CO2 29   < > 30  --  28  GLUCOSE 125*   < >  137*  --  126*  BUN 31*   < > 34*  --  34*  CREATININE 0.93   < > 0.81  --  0.75  CALCIUM  8.5*   < > 8.9  --  8.7*  MG  --   --  1.6*  --  2.7*  PHOS 3.5  --   --   --  3.7   < > = values in this interval not displayed.   Liver Function Tests Recent Labs    12/08/24 0410 12/09/24 0403 12/10/24 0617  AST 124*  --  155*  ALT 39  --  58*  ALKPHOS 112  --  95  BILITOT 3.9*  --  3.2*  PROT 4.6*  --  5.1*  ALBUMIN  1.5* 2.1* 2.0*   No results for input(s): LIPASE, AMYLASE in the last 72 hours. Cardiac Enzymes No results for input(s): CKTOTAL, CKMB, CKMBINDEX, TROPONINI in the last 72 hours.  BNP: BNP (last 3 results) No results for input(s): BNP in the last 8760 hours.  ProBNP (last 3 results) Recent Labs     11/22/24 1958  PROBNP 3,354.0*     D-Dimer No results for input(s): DDIMER in the last 72 hours.  Hemoglobin A1C No results for input(s): HGBA1C in the last 72 hours. Fasting Lipid Panel Recent Labs    12/10/24 0029  TRIG 212*   Thyroid Function Tests No results for input(s): TSH, T4TOTAL, T3FREE, THYROIDAB in the last 72 hours.  Invalid input(s): FREET3  Other results:   Imaging    DG CHEST PORT 1 VIEW Result Date: 12/04/2024 CLINICAL DATA:  Ventilator dependence. EXAM: PORTABLE CHEST 1 VIEW COMPARISON:  12/03/2024 FINDINGS: Endotracheal tube tip is approximately 3.8 cm above the base of the carina. The NG tube passes into the stomach although the distal tip position is not included on the film. Right-sided Impella device is stable in position. A left IJ pulmonary artery catheter tip overlies expected location of the interlobar pulmonary artery. Right pleural drain again noted with persistent right pleural fluid. Basilar collapse/consolidation noted, IMPRESSION: 1. No substantial interval change. 2. Persistent right pleural effusion with right base collapse/consolidation. 3. Support apparatus as above. Electronically Signed   By: Camellia Candle M.D.   On: 12/04/2024 07:15   DG Abd 1 View Result Date: 12/04/2024 CLINICAL DATA:   Routine adult health maintenance Best images obtainable due to patient's condition EXAM: ABDOMEN - 1 VIEW COMPARISON:  12/04/2024 FINDINGS: NG tube tip is in the distal stomach. Diffuse gaseous distention of colon evident, similar to prior. IMPRESSION: NG tube tip is in the distal stomach. Electronically Signed   By: Camellia Candle M.D.   On: 12/04/2024 07:11   CT Angio Chest/Abd/Pel for Dissection W and/or W/WO Addendum Date: 12/03/2024 ADDENDUM #1 ADDENDUM: ---------------------------------------------------- These results were called to Dr. Daniel  at 10:15 pm on 12/03/2024. Electronically signed by: Franky Crease MD 12/03/2024 10:23 PM EST RP  Workstation: [id number]   Result Date: 12/03/2024 ORIGINAL REPORT EXAM: CT CHEST, ABDOMEN AND PELVIS WITH AND WITHOUT CONTRAST 12/03/2024 09:52:07 PM TECHNIQUE: CT of the chest, abdomen and pelvis was performed with and without the administration of intravenous contrast. Multiplanar reformatted images are provided for review. Automated exposure control, iterative reconstruction, and/or weight based adjustment of the mA/kV was utilized to reduce the radiation dose to as low as reasonably achievable. COMPARISON: None available. CLINICAL HISTORY: Acute aortic syndrome (AAS) suspected; Evaluate for intercostal bleeding or other sources after thoracentesis; also evaluate bowel. FINDINGS: CHEST: MEDIASTINUM  AND LYMPH NODES: Mild cardiomegaly. Impella device tip in the left ventricle. Swan-ganz catheter tip in the central right pulmonary artery. No evidence of aortic aneurysm or dissection. The central airways are clear. No mediastinal, hilar or axillary lymphadenopathy. LUNGS AND PLEURA: Right chest tube in place. Moderate to large right pleural effusion with compressive atelectasis in the right middle lobe and right lower lobe. There appears to be active extravasation of contrast posteriorly between the right 9th and 10th ribs and likely into the pleural space, possibly related to prior thoracentesis. The pleural fluid appears complex on the right, likely hemothorax. Small left pleural effusion with compressive atelectasis in the left lower lobe. Tiny right pneumothorax. Biapical subpleural blebs. No evidence of pulmonary embolus. ABDOMEN AND PELVIS: LIVER: Gas is seen branching peripherally in the liver compatible with portal venous gas. GALLBLADDER AND BILE DUCTS: Gallbladder is unremarkable. No biliary ductal dilatation. SPLEEN: No acute abnormality. PANCREAS: No acute abnormality. ADRENAL GLANDS: No acute abnormality. KIDNEYS, URETERS AND BLADDER: No stones in the kidneys or ureters. No hydronephrosis. No  perinephric or periureteral stranding. Urinary bladder is unremarkable. GI AND BOWEL: Stomach and small bowel are decompressed. Diffuse gaseous distention of the colon suggests ileus. Moderate stool burden in the rectosigmoid colon. Pneumatosis noted in small bowel loops in the lower pelvis with air in the mesenteric veins feeding these small bowel loops. Pneumatosis also noted in the right colon wall. REPRODUCTIVE ORGANS: No acute abnormality. PERITONEUM AND RETROPERITONEUM: No ascites. No free air. VASCULATURE: Aorta is normal in caliber. ABDOMINAL AND PELVIS LYMPH NODES: No lymphadenopathy. BONES AND SOFT TISSUES: No acute osseous abnormality. No focal soft tissue abnormality. IMPRESSION: 1. Active contrast extravasation between the right 9th and 10th ribs with likely extension into the pleural space, suspicious for ongoing intercostal bleeding likely related to recent thoracentesis. 2. Moderate to large right pleural effusion compatible with hemothorax with compressive atelectasis in the right middle and lower lobes; right chest tube present; tiny right pneumothorax. 3. No evidence of aortic aneurysm, aortic dissection, or pulmonary embolus. 4. Pneumatosis involving small bowel loops in the lower pelvis and the right colon with mesenteric venous gas and portal venous gas, concerning for bowel ischemia. Recommend surgical consultation. 5. Small left pleural effusion with compressive atelectasis in the left lower lobe. 6. . Attempts are being made to contact the ordering physician with the results. An addendum will be made at that time. Electronically signed by: Franky Crease MD 12/03/2024 10:09 PM EST RP Workstation: [id number]   DG Chest Port 1 View Result Date: 12/03/2024 EXAM: 1 VIEW(S) XRAY OF THE CHEST 12/03/2024 07:57:00 PM COMPARISON: Portable chest today at 7:02 pm. CLINICAL HISTORY: Encounter for chest tube placement. FINDINGS: LINES, TUBES AND DEVICES: A pigtail chest tube has been inserted on the  right through the 6th intercostal space, with the pigtail in the lateral upper right thorax. Left IJ Swan-Ganz line again terminates in the distal right pulmonary artery. Stable positioning of right subclavian Impella device. LUNGS AND PLEURA: No pneumothorax. Moderate to large right pleural effusion is still present but decreased in the interval. Small left pleural effusion unchanged. The right mid to lower lung is obscured by pleural fluid. There is patchy consolidation in the left lower lung field. Left mid and both apical lungs are clear. HEART AND MEDIASTINUM: Sternotomy sutures and two cardiac valve replacements are again shown. There is mild cardiomegaly and mild central vascular prominence as before. BONES AND SOFT TISSUES: No acute osseous abnormality. IMPRESSION: 1. Right pigtail chest tube in  position with no measurable pneumothorax. 2. Moderate to large right pleural effusion, decreased compared to earlier today. 3. Patchy consolidation in the left lower lung field. 4. Small left pleural effusion, unchanged. 5. Cardiomegaly . Electronically signed by: Francis Quam MD 12/03/2024 08:10 PM EST RP Workstation: [id number]   DG CHEST PORT 1 VIEW Result Date: 12/03/2024 EXAM: 1 VIEW(S) XRAY OF THE CHEST 12/03/2024 07:05:00 PM COMPARISON: None available. CLINICAL HISTORY: ABLA (acute blood loss anemia). FINDINGS: LINES, TUBES AND DEVICES: Impella device and Swan-Ganz catheter remain in place, unchanged. LUNGS AND PLEURA: Enlarging right pleural effusion, now large with atelectasis throughout the right lung. Vascular congestion and left basilar atelectasis. Low lung volumes. No pneumothorax. HEART AND MEDIASTINUM: No acute abnormality of the cardiac and mediastinal silhouettes. BONES AND SOFT TISSUES: No acute osseous abnormality. IMPRESSION: 1. Enlarging right pleural effusion, now large, with associated right lung atelectasis. 2. Vascular congestion with left basilar atelectasis. 3. Low lung volumes.  Electronically signed by: Franky Crease MD 12/03/2024 07:10 PM EST RP Workstation: [id number]   DG Chest Port 1 View Result Date: 12/03/2024 CLINICAL DATA:  Pleural effusion, status post thoracentesis EXAM: PORTABLE CHEST 1 VIEW COMPARISON:  12/03/2024 FINDINGS: Single frontal view of the chest demonstrates stable left internal jugular flow directed central venous catheter, tip overlying the right infrahilar region. Stable mitral and aortic valve prostheses. Cardiac silhouette remains enlarged. Lung volumes are diminished, with bibasilar veiling opacities, right greater than left, consistent with consolidation and effusions. No appreciable change since prior study. No evidence of pneumothorax. IMPRESSION: 1. Persistent bibasilar consolidation and effusions, right greater than left. 2. No evidence of pneumothorax. 3. Stable enlarged cardiac silhouette. Electronically Signed   By: Ozell Daring M.D.   On: 12/03/2024 16:10    Medications:   Scheduled Medications:  arformoterol   15 mcg Nebulization BID   Chlorhexidine  Gluconate Cloth  6 each Topical Daily   folic acid   1 mg Intravenous Daily   insulin  aspart  0-9 Units Subcutaneous Q4H   lidocaine   1 patch Transdermal Q24H   mouth rinse  15 mL Mouth Rinse Q2H   pantoprazole  (PROTONIX ) IV  40 mg Intravenous Daily   revefenacin   175 mcg Nebulization Daily   sodium chloride  flush  10 mL Intrapleural Q8H   sodium chloride  flush  3 mL Intravenous Q12H   thiamine  (VITAMIN B1) injection  100 mg Intravenous Daily    Infusions:  sodium chloride  20 mL/hr at 12/08/24 1500   amiodarone  30 mg/hr (12/10/24 9347)   dexmedetomidine  (PRECEDEX ) IV infusion 0.5 mcg/kg/hr (12/10/24 0652)   fentaNYL  infusion INTRAVENOUS 175 mcg/hr (12/10/24 0652)   heparin  500 Units/hr (12/10/24 9347)   micafungin  (MYCAMINE ) 100 mg in sodium chloride  0.9 % 100 mL IVPB     milrinone  0.25 mcg/kg/min (12/10/24 9347)   norepinephrine  (LEVOPHED ) Adult infusion 1 mcg/min (12/10/24  9347)   piperacillin -tazobactam 12.5 mL/hr at 12/10/24 0652   propofol  (DIPRIVAN ) infusion Stopped (12/09/24 1646)   sodium bicarbonate  25 mEq (Impella PURGE) in dextrose  5 % 1000 mL bag     TPN ADULT (ION) 72 mL/hr at 12/10/24 0652   vancomycin  Stopped (12/09/24 2330)    PRN Medications: sodium chloride , sodium chloride , albuterol , fentaNYL , fentaNYL  (SUBLIMAZE ) injection, hydrALAZINE , ondansetron  (ZOFRAN ) IV, mouth rinse, mouth rinse, sodium chloride , sodium chloride  flush  Assessment/Plan  1.  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  Valvular Disease  - severe MR posteriorly directed, mod TR. Likely functional.  - severe AI.-->CMR - Severe AR/MR  - S/P bioprosthetic AVR/MVR with Impella  5.5 12/8 - Will need coumadin when Impella out per TCTS  2. Acute Systolic Heart Failure w/ Biventricular Dysfunction >> caridogenic shock - HS trop not c/w ACS but EKG w/ anteroseptal Qs  - CMRI Severe AR/MR. LVEF 35%  - Cath- normal cors, RA5, PA 32/17 (24), PVR 2.57 Papi 3. CO 4.9 CI 3.8 - Post-op Echo 12/02/24: EF < 20% with severe LVH (new, ?inflammatory), moderately decreased RV function, stable bioprosthetic MV and AoV.  - Impella @ P7 Flow 3.9.  HCO3 purge only.  Started heparin  gtt 12/17. Now weaning Impella. Will discuss timing of further wean with Dr. Rolan as he is likely going to OR today. - LDH 241 => 299 => 324> 444>801>715, suspect rise d/t recent surgeries - Platelets slowly coming up, 135K - Co-ox 85% today and CI 3.6. More of a septic type picture now. Remains on 0.25 milrinone  + low dose NE (off and on due to sedation) - Excellent urine output yesterday with IV lasix  but negative < 1L d/t intake. CVP 5-6. Give 80 mg IV lasix  this am as he will likely get fluids in OR. - Holding digoxin  - Holding GDMT until back off pressors.   3. R hemothorax - Hemothorax s/p right thoracentesis with intercostal artery injury. Massive bleeding requiring multiple products and development of  hemorrhagic shock, now improved. S/p IR embolization.  - Hgb stable 9.6 - Transfuse hgb < 8.  - Back on low dose heparin  - Chest tube in place, 136 mL output last 24 hrs - Possible OR today  4. Transaminates - Hepatitis panel negative. RUQ US  unremarkable  - suspect 2/2 CHF/hepatic congestion + chronic ETOH use    5.  ETOH Abuse - heavy drinker, at least 5 drinks per evening - may be contributing to CM, reduction of intake imperative  - No evidence of withdrawal   6  Hypomagnesemia - follow; goal >2    7 . DMII  - Hgb A1C 6.1 - On SSI.  8. Ischemic bowel Mesenteric ischemia - CXR with dilated bowels.  - CT scan demonstrated active extravasation from a posterior intercostal artery. Also demonstrated portal venous gas and pneumatosis of the small and large bowel.  - S/p exp/lap 12/11. Patchy ischemia of the distal ileum extending over about 75 cm without perforation. Ischemic segment resected.  Colon was distended with gas but viable. Small bowel left in discontinuity.  - Back to OR 12/15 for ischemic R colon and mesenteric ischemia. S/p exp/lap, wound vac exchange, R colectomy without anastomosis.  - Returned to OR 12/16 for small bowel resection and ileostomy.   9. ID - Covering bowel pathogens with Zosyn .  - Remains febrile with Tm 101.5, continue vancomycin . Micafungin  started on 12/16 candida albicans in trach aspirate. GPC from trach aspirate still pending. PCT elevated, 4.79. BC X 2 pending. - Has tylenol  ordered  10. AKI - AKI with shock.  - Resolved  11. FEN - TPN  12. Rhythm - SR today, has been in accelerated junctional rhythm as well.   13. VT - On amiodarone  gtt.   14. Pericardial effusion - There is a moderate effusion without tamponade, noted again by echo 12/16.  Will need to follow over time.  - Limited echo 12/17 with moderate effusion.  - Possible drainage during VATS washout, likely today  CRITICAL CARE Performed by: COLLETTA SHAVER N   Total  critical care time: 20 minutes  Critical care time was exclusive of separately billable procedures and treating other patients.  Critical care was necessary to treat or prevent imminent or life-threatening deterioration.  Critical care was time spent personally by me on the following activities: development of treatment plan with patient and/or surrogate as well as nursing, discussions with consultants, evaluation of patient's response to treatment, examination of patient, obtaining history from patient or surrogate, ordering and performing treatments and interventions, ordering and review of laboratory studies, ordering and review of radiographic studies, pulse oximetry and re-evaluation of patient's condition.    Length of Stay: [address] LINDSAY N, PA-C  12/10/2024, 7:04 AM  Advanced Heart Failure Team Pager [phone] (M-F; 7a - 5p)  Please contact [facility] for night-coverage after hours (5p -7a ) and weekends on [email or web address]  Patient seen with PA, I formulated the plan and agree with the above note.   Co-ox 69% today with CI 3.8 off Swan.  Milrinone  0.25, NE 1, Impella P7 with flow 3.9 L/min today. CVP 11 on my read. Good UOP yesterday, I/Os net negative 864 cc.  LDH lower at 715, plts higher 135.    Echo yesterday showed stable moderate pericardial effusion and stable Impella position, EF 25-30% with moderate RV dysfunction.  Bioprosthetic valves stable.   He is in NSR on amiodarone  and heparin  gtts.  Getting TPN   General: NAD Neck: JVP 10 cm, no thyromegaly or thyroid nodule.  Lungs: Clear to auscultation bilaterally with normal respiratory effort. CV: Nondisplaced PMI.  Heart regular S1/S2, no S3/S4, no murmur.  No peripheral edema.   Abdomen: Soft, ostomy present, no hepatosplenomegaly, no distention.  Skin: Intact without lesions or rashes.  Neurologic: Alert and oriented x 3.  Psych: Normal affect. Extremities: No clubbing or cyanosis.  HEENT: Normal.   Patient  will return to OR today for chest washout and pericardial window.    Continue Impella P7, can wean starting tomorrow when OR visits are complete. LDH trending down and plts up, do not think significant hemolysis is present. Position looked good on echo yesterday.  Continue current milrinone  for now and can wean off NE as able.   CVP 11, will give Lasix  80 mg IV bid again today.  Creatinine stable.   In NSR on amiodarone  gtt and heparin  gtt.   He remains febrile, on Zosyn , vancomycin , and micafungin  (had candida in trach aspirate).  To get bronch with BAL today.   CRITICAL CARE Performed by: Ezra Shuck  Total critical care time: 40 minutes  Critical care time was exclusive of separately billable procedures and treating other patients.  Critical care was necessary to treat or prevent imminent or life-threatening deterioration.  Critical care was time spent personally by me on the following activities: development of treatment plan with patient and/or surrogate as well as nursing, discussions with consultants, evaluation of patient's response to treatment, examination of patient, obtaining history from patient or surrogate, ordering and performing treatments and interventions, ordering and review of laboratory studies, ordering and review of radiographic studies, pulse oximetry and re-evaluation of patient's condition.  Ezra Shuck 12/10/2024 9:55 AM

## 2024-12-10 NOTE — Progress Notes (Signed)
 PHARMACY - TOTAL PARENTERAL NUTRITION CONSULT NOTE   Indication: massive bowel resection   Patient Measurements: Height: 5' 10 (177.8 cm) Weight: 81.3 kg (179 lb 3.7 oz) IBW/kg (Calculated) : 73 TPN AdjBW (KG): 69.9 Body mass index is 25.72 kg/m. Usual Weight: weight 79kg on admission, unclear baseline.   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 Patient underwent an aortic and mitral valve replacement on 12/8 with insertion of Impella. Post-op was found to have continuing HgB drop with noted abdominal pain. Was found to have hemorrhage from intercostal, chest tube and embolization was completed. Workup for dropping HgB included CT angio of chest/ab/and pelvis showed pneumatosis and concern for bowel ischemia. Patient went to OR on 12/12 for ex-lap found to have ischemia of distal ileum over 75cm without perforation. Patient was left in discontinuity with plans to return to OR.  Patient with diet throughout admission however noted nausea and abdominal pain during peri-op time period. Pharmacy consulted to manage TPN.   Glucose / Insulin : no hx DM, A1c 6.1% - CBGs < 180 Used 5 units sSSI in the past 24 hrs Electrolytes: Na/CL 146/109, CoCa 10.4 (iCa 1.21, received 2g IV yesterday), Phos 3.7, K up 4.0 (received 60 meq yesterday and 30 mEq this AM outside of TPN), mg up 2.7 (received 4g IV this morning outside of TPN) Renal: SCr 0.75 (BL SCr ~ 0.7), BUN 34.  12/15 Lasix  60 mg IV x 2  12/16 Lasix  80 mg IV x 2  12/17 Lasix  80 mg IV x 2  12/18 Lasix  80 mg IV x 2 Hepatic: AST/ALT mildly elevated, tbili down 3.2 (no jaundice), alk phos wnl, TG 212, albumin  2.0 *elevation not due to TPN Intake / Output; MIVF: UOP 2.6 ml/kg/hr, NG up , chest tube down to 136 mL, LBM 12/17 (175 mL), net -11.2L GI Imaging: 12/11 CT: pneumatosis of small bowel with gas, concerning for bowel ischemia   GI Surgeries / Procedures:  12/11 ex-lap with distal ileum with ischemia (75cm), left in discontinuity with plans to return to OR on Sunday to evaluate for further ischemia   12/14 ex lap, placement of wound vac, right colectomy due to necrosis, abdomen left open  12/16 small bowel resection, end ileostomy, closure   Central access: PICC placed 12/04/24 TPN start date: 12/04/24  Nutritional Goals:  Goal concentrated TPN rate with lipids is 72 mL/hr (76g/L AA, 38g/L ILE, and 16.5% CHO will provide 131g AA, 285g CHO, 64g ILE and 2134 kcals per day)  RD Estimated Needs Total Energy Estimated Needs: 2200-2400kcals Total Protein Estimated Needs: 125-150 g Total Fluid Estimated Needs: 1.8 L  Current Nutrition:  TPN Primary team switching to prop during VATS procedure 12/18, plans to switch to Precedex  after  Plan:  Concentrate TPN as fluid resuscitation is completed receiving diuresis  Continue TPN with lipids at goal of 72 ml/hr.  Electrolytes in TPN: Na to 0 mEq/L, K 92mEq/L, Ca 37mEq/L, Mg 45mEq/L, Phos 3mmol/L, max CL (ratio is 1:2.27, required for TPN compatibility) Add standard MVI and trace elements to TPN Continue sSSI Q4H  Monitor TPN labs on Mon/Thurs - labs in AM Follow for return of bowel function and initiation of enteral feeds   KCL IV x 3 runs per primary team given this morning  Give an additional KCL IV x 3 runs to account for evening Lasix  dose   Travers Goodley, PharmD

## 2024-12-10 NOTE — Procedures (Signed)
 Bronchoscopy Procedure Note  VIRGINIA FRANCISCO  [id number]  05-25-1981  Date:12/10/2024  Time:11:39 AM   Provider Performing:Elyana Grabski C Claudene   Procedure(s):  Flexible bronchoscopy with bronchial alveolar lavage [phone])  Indication(s) Fevers, abnormal CXR  Consent Risks of the procedure as well as the alternatives and risks of each were explained to the patient and/or caregiver.  Consent for the procedure was obtained and is signed in the bedside chart  Anesthesia In place for mechanical ventilation + 1 time dose 20mg  etomidate    Time Out Verified patient identification, verified procedure, site/side was marked, verified correct patient position, special equipment/implants available, medications/allergies/relevant history reviewed, required imaging and test results available.   Sterile Technique Usual hand hygiene, masks, gowns, and gloves were used   Procedure Description Bronchoscope advanced through endotracheal tube and into airway.  Airways were examined down to subsegmental level with findings noted below.   Following diagnostic evaluation, BAL(s) performed in RML with normal saline and return of plug-filled mostly clear fluid  Findings:  ETT in good position Some thick whitish secretions in distal trachea nonobstructing Extrinsic compression of bronchus intermedius downward No clear signs of bronchitis or infection   Complications/Tolerance None; patient tolerated the procedure well. Chest X-ray is not needed post procedure.   EBL Minimal   Specimen(s) RML BAL

## 2024-12-10 NOTE — Progress Notes (Signed)
 Nutrition Follow-up  DOCUMENTATION CODES:   Not applicable  INTERVENTION:   TPN to meet nutritional needs -Re-estimated needs and increase calorie range recommendations. Current TPN does not provides 100% of calorie needs  Recommend trial of trickle TF once +bowel function.  Trickle TF recommendations: Vital 1.5 at 20 ml/hr  NUTRITION DIAGNOSIS:   Inadequate oral intake related to acute illness, altered GI function as evidenced by NPO status.  Being addressed via TPN  GOAL:   Patient will meet greater than or equal to 90% of their needs  Progressing  MONITOR:   Vent status, Labs, Weight trends, I & O's  REASON FOR ASSESSMENT:   Consult, Ventilator New TPN/TNA, Assessment of nutrition requirement/status  ASSESSMENT:   43 yo male admitted with acute systolic heart failure with severe MR and AI. Pt to OR on 12/08 for AVR, MVR and Impella 5.5 placement and extubated 12/09. Pt with thoracentesis on 12/11 followed by IR coil embolization for bleeding intercostal artery with R hemothorax, pt also developed ischemic bowel requiring bowel resection. PMH includes HTN, tobacco and Etoh abuse (reports drinking at least 5 drinks per night)  11/30 Admitted, Echo EF 35-40% (grade 3 diastolic dysfunction), severe MR, aortic insufficiency 12/08 OR: AVR, MVR, Impella 5.5 with Dr. Lucas 12/09 Extubated 12/11 Thoracentesis with 1L bloody output, post procedure anemia, +R Hemithorax, IR for coil embolization of intercostal artery secondary to active bleeding, OR for ischemic bowel (patchy ischemia distal ileum ~75 cm) without perforation requiring resection, bowel left in discontinuity, abdomen open with ABThera in place 12/12 TPN initiated 12/15 OR: ischemic R colon requiring R colectomy without anastomosis 12/16 OR: SB resection, abd closed, end ileostomy creation  Plan for Bronch today in addition to OR with TCTS for chest washout  Pt remains on vent support, awake/oriented.  Impella  5.5 at P8, flow 4.2 L/min  TPN infusing at 72 ml/hr providing 2134 kcals, 131 g of protein  +pitting edema on exam; wt remains up since admission. Current wt 81.3 kg  UOP 5L in 24 hours, NG with 400 mL out CVP 6-8, suspect some of the edema is related to anasarca  Admit Wt: around 76-77 kg Current Wt: 81.3 kg  WOC RN to bedside to assess RLQ ileostomy. +small amount of blood around stoma. +stool per rectum via FMS  Noted WOC RN to return later to place midline abdominal wound VAC  Labs: Sodium 146 (H) Potassium 4.0 (wdl) BUN 34 Creatinine wdl Phosphorus 3.7 (wdl) Magnesium  2.7 (H post L overnight) CBGs 105-132  Meds:  IV Folic Acid  IV Thiamine  Lasix  80 mg x 1 this AM, followed by orders for lasix  80 mg BID SS novolog  IV Protonix   Diet Order:   Diet Order             Diet NPO time specified  Diet effective now                   EDUCATION NEEDS:   Not appropriate for education at this time  Skin:  Skin Assessment: Skin Integrity Issues: Skin Integrity Issues:: Other (Comment), DTI DTI: buttocks Wound Vac: open abdomen-ABThera Incisions: chest x 2 Other: new ileostomy  Last BM:  12/18 +type 7 stool per rectum via FMS, +new ileostomy with some bloody output  Height:   Ht Readings from Last 1 Encounters:  12/04/24 5' 10 (1.778 m)    Weight:   Wt Readings from Last 1 Encounters:  12/10/24 81.3 kg     BMI:  Body mass  index is 25.72 kg/m.  Estimated Nutritional Needs:   Kcal:  2200-2400kcals  Protein:  125-150 g  Fluid:  1.8 L   Betsey Finger MS, RDN, LDN, CNSC Registered Dietitian 3 Clinical Nutrition RD Inpatient Contact Info in Amion

## 2024-12-10 NOTE — Progress Notes (Signed)
 NAME:  Alejandro Lopez, MRN:  [id number], DOB:  [birth year], LOS: 17 ADMISSION DATE:  11/22/2024, CONSULTATION DATE:  12/8 REFERRING MD:  Lucas, CHIEF COMPLAINT:  post cardiac surgery critical care services    History of Present Illness:  43 year old male patient with history of hypertension, alcohol and tobacco abuse, presented to the emergency room on 11/30 with approximately 1 month history of progressive dyspnea accompanied by lower extremity swelling extending up to the level of his scrotum and abdomen. Diagnostic evaluation by echocardiogram showed left ventricular ejection fraction 35 to 40% with grade 3 diastolic dysfunction this was further complicated by severe mitral valve regurgitation and aortic insufficiency because of this he was transferred to [facility] for further evaluation. He underwent cardiac catheterization on 12/4: Right heart hemodynamic parameters showed baseline right atrial pressure 5 mmHg, PA pressure 32/17, pulmonary capillary wedge pressure at 14 estimated Fick 4.9 L/min with cardiac index Fick calculated at 2.59 His PAPi was 3 Left heart cath was negative for coronary artery disease Went to OR 12/8 for MVR and AVR w/ impella insertion. PCCM asked to assist w/ post op care   OR course EBL: 1735 Received  Products: cryo 92ml, FFP 401 Also received DDAVP , 2200 crystalloid, 250ml albumin   Cell saver 1125 Pump time 4hrs Clamp time 2hrs 50 min  Events: multiple defibrillations intra-op  Intra-op ECHO EF estimated 40%  Pertinent  Medical History  Tobacco abuse, alcohol abuse, hypertension  Significant Hospital Events: Including procedures, antibiotic start and stop dates in addition to other pertinent events   11/30 admitted/. ECHO 35 to 40% with grade 3 diastolic dysfunction this was further complicated by severe mitral valve regurgitation and aortic insufficiency 12/4 left and right heart cath 12/8 AVR and MVR w/ bioprosthetic valves. Arrived on icu  w/ Impella 5.5 MCS at flow 4 lpm and P 7. Received 2 more PLTs, 2 FFP and 1 cryo in first 6 hrs post op for cont blood loss/oozing. Required NE, epi and milrinone . Good flow on IMPELLA but minimal pulsatility  at P7. Placed on Amio gtt for VT 12/9 received 1 unit PRBC over night for hgb down to 7.5, hgb 8 getting second unit of blood. Still on P7 3.5 lPM. Extubated. 12/11 1L thora, later hemorrhagic shock and hemothorax> IR embolization. Also found to have ischemic bowel and underwent ex-lap with partial resection of ileum. 12/15 plan for ostomy tomorrow, transition epi to levophed   12/16 plan back to OR for ostomy placement  12/17 hemodynamically stable off pressors on Milrinone  0.25 , unable to tolerate SBT, plan for CT chest and heparin  gtt, high fevers>micafungin   Interim History / Subjective:    sodium chloride  20 mL/hr at 12/08/24 1500   amiodarone  30 mg/hr (12/10/24 9347)   dexmedetomidine  (PRECEDEX ) IV infusion 0.5 mcg/kg/hr (12/10/24 9347)   fentaNYL  infusion INTRAVENOUS 175 mcg/hr (12/10/24 0652)   heparin  500 Units/hr (12/10/24 9347)   micafungin  (MYCAMINE ) 100 mg in sodium chloride  0.9 % 100 mL IVPB     milrinone  0.25 mcg/kg/min (12/10/24 9347)   norepinephrine  (LEVOPHED ) Adult infusion 1 mcg/min (12/10/24 9347)   piperacillin -tazobactam 12.5 mL/hr at 12/10/24 0652   propofol  (DIPRIVAN ) infusion Stopped (12/09/24 1646)   sodium bicarbonate  25 mEq (Impella PURGE) in dextrose  5 % 1000 mL bag     TPN ADULT (ION) 72 mL/hr at 12/10/24 0652   vancomycin  Stopped (12/09/24 2330)   Ongoing fevers  Objective    Blood pressure 98/82, pulse 82, temperature (!) 101.5 F (38.6 C), resp.  rate (!) 24, height 5' 10 (1.778 m), weight 81.3 kg, SpO2 98%. PAP: (31-61)/(16-45) 38/21 CVP:  [4 mmHg-34 mmHg] 13 mmHg CO:  [4.4 L/min-8.4 L/min] 6.1 L/min CI:  [2.4 L/min/m2-4.5 L/min/m2] 3.2 L/min/m2  Vent Mode: PRVC FiO2 (%):  [40 %] 40 % Set Rate:  [22 bmp] 22 bmp Vt Set:  [580 mL] 580  mL PEEP:  [5 cmH20] 5 cmH20 Plateau Pressure:  [18 cmH20-22 cmH20] 19 cmH20   Intake/Output Summary (Last 24 hours) at 12/10/2024 0653 Last data filed at 12/10/2024 9347 Gross per 24 hour  Intake 4965.88 ml  Output 5951 ml  Net -985.12 ml   Filed Weights   12/08/24 0500 12/09/24 0420 12/10/24 0500  Weight: 81.8 kg 81.5 kg 81.3 kg   No distress Triggers vent Reduced breath sounds R base Awake following commands Ostomy still some bloody output Prior CP site looks okay 5-5 looks okay Vent mechanics improvedd  Ongoing R effusion on CXR Stable leukocytosis Cr okay  Resolved problem list   AKI Lactic acidosis, resolved Constipation Hyponatremia  Assessment and Plan   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 VT; frequent ectopy R pleural effusion> hemothorax post thora, now with retained blood and chest tube in place.  Hypoxic respiratory failure Postop large pericardial effusion- without clear tamponade physiology but not helping things Fevers, distributive shock- antifungal coverage added to vanc/zosyn  12/17 - Impella, amio and inotropes per AHF - Fixed dose heparin , hold for (hopeful) OR today - Start standing tylenol  - Add back propofol , goal RASS -1 to -2 - Plan is to clean out R pleural space and simultaneous pericardial window then work on vent and impella wean - Monitor usual hemolysis parameters  Ischemic bowel with pneumatosis; s/p partial ileum resection.  OR 12/14 for reexploration of the abdomen, found necrotic portions of the distal ileum and cecum along with the anterior lateral ascending colon. Underwent R colectomy without anastomosis 12/16 abdomen closed with right ostomy placement -con't TPN & PICC; keeping lipids out of TPN until extubated -monitor ostomy output   Hyperglycemia; h/o pre-DM. A1c 6.1 -SSI  PRN -goal BG 140-180 -OP follow up with PCP for prediabetes   H/o tobacco abuse H/o asthma vs COPD -Quitting smoking recommended; have counseled him on this previously -nebs while on vent -pulmonary hygiene   ETOH abuse -wants to quit, address when appropriate     DVT: Fixed dose heparin  drip Needs foley, lines, chest tube, impella FEN-TPN    Critical care time:  41 minutes      CRITICAL CARE Performed by: Toribio [name]   Total critical care time:  35 minutes  Critical care time was exclusive of separately billable procedures and treating other patients.  Critical care was necessary to treat or prevent imminent or life-threatening deterioration.  Critical care was time spent personally by me on the following activities: development of treatment plan with patient and/or surrogate as well as nursing, discussions with consultants, evaluation of patient's response to treatment, examination of patient, obtaining history from patient or surrogate, ordering and performing treatments and interventions, ordering and review of laboratory studies, ordering and review of radiographic studies, pulse oximetry and re-evaluation of patient's condition.   Leita SAUNDERS Gleason, PA-C [facility] See Amion for pager If no response to pager , please call [phone] until 7pm After 7:00 pm call Elink  [phone]

## 2024-12-10 NOTE — Progress Notes (Addendum)
 2 Days Post-Op Procedures (LRB): LAPAROTOMY, EXPLORATORY (N/A) CREATION, ILEOSTOMY END (N/A) EXCISION, SMALL INTESTINE (N/A) Subjective: No acute events overnight Still having fevers - blood cultures from 12/17 still negative Remains neuro intact Starting having multiple BM yesterday  Objective: Vital signs in last 24 hours: Temp:  [99.9 F (37.7 C)-101.8 F (38.8 C)] 101.5 F (38.6 C) (12/18 0700) Pulse Rate:  [62-115] 82 (12/18 0700) Cardiac Rhythm: Normal sinus rhythm (12/18 0000) Resp:  [0-28] 22 (12/18 0700) BP: (86-114)/(66-88) 101/80 (12/18 0700) SpO2:  [95 %-100 %] 97 % (12/18 0700) Arterial Line BP: (85-141)/(53-85) 110/63 (12/18 0700) FiO2 (%):  [40 %] 40 % (12/18 0400) Weight:  [81.3 kg] 81.3 kg (12/18 0500)  Hemodynamic parameters for last 24 hours: PAP: (31-61)/(16-45) 39/22 CVP:  [4 mmHg-34 mmHg] 12 mmHg CO:  [4.4 L/min-8.4 L/min] 6.4 L/min CI:  [2.4 L/min/m2-4.5 L/min/m2] 3.4 L/min/m2  Intake/Output from previous day: 12/17 0701 - 12/18 0700 In: 4966.7 [I.V.:3072.5; IV Piggyback:1531.1] Out: 5831 [Urine:5120; Emesis/NG output:400; Stool:175; Chest Tube:136] Intake/Output this shift: No intake/output data recorded.  General appearance: intubated, sedated Heart: tachy, regular Lungs: rhonchi bilaterally Abdomen: Ostomy in place, no function yet Extremities: edema  Wound: sternal intact  Lab Results: Recent Labs    12/09/24 1447 12/10/24 0614 12/10/24 0617  WBC 17.2*  --  17.1*  HGB 10.4* 9.2* 9.6*  HCT 30.3* 27.0* 27.9*  PLT 108*  --  135*   BMET:  Recent Labs    12/10/24 0029 12/10/24 0614 12/10/24 0617  NA 146* 145 146*  K 3.5 3.8 4.0  CL 108  --  109  CO2 30  --  28  GLUCOSE 137*  --  126*  BUN 34*  --  34*  CREATININE 0.81  --  0.75  CALCIUM  8.9  --  8.7*    PT/INR:  No results for input(s): LABPROT, INR in the last 72 hours.  ABG    Component Value Date/Time   PHART 7.454 (H) 12/10/2024 0614   HCO3 30.3 (H) 12/10/2024  0614   TCO2 32 12/10/2024 0614   ACIDBASEDEF 1.0 12/06/2024 1432   O2SAT 69.2 12/10/2024 0617   CBG (last 3)  Recent Labs    12/09/24 1934 12/09/24 2331 12/10/24 0406  GLUCAP 105* 127* 124*    Assessment/Plan: S/P Procedures (LRB): LAPAROTOMY, EXPLORATORY (N/A) CREATION, ILEOSTOMY END (N/A) EXCISION, SMALL INTESTINE (N/A) POD # 10/4 NEURO- sedated but neuro intact on awakening trials CV- in sinus tachy  Impella p8, 4.2 L/min  Hemodynamic good on milrinone , off/on levo overnight, off this morning  Known moderate pericardial effusion RESP- CXR stable  VDRF- vent per CCM  Needs chest washed out - likely OR today  CCM to bronch this morning, send BAL given persistent fever RENAL- cre and lytes stable, continue diuresis per HF team ENDO- CBG well controlled GI - Abdomen closed and end ileostomy on 12/16 - await return of bowel function  On TPN ID- febrile, continue vanc/zosyn , added mica on 12/17 - blood cultures sent Remains critically ill   LOS: 17 days    Alejandro Lopez Alejandro Lopez 12/10/2024

## 2024-12-10 NOTE — Transfer of Care (Signed)
 Immediate Anesthesia Transfer of Care Note  Patient: Alejandro Lopez  Procedure(s) Performed: VIDEO ASSISTED THORACOSCOPY (VATS)/DECORTICATION (Right)  Patient Location: 2 heart  Anesthesia Type:General  Level of Consciousness: sedated and Patient remains intubated per anesthesia plan  Airway & Oxygen  Therapy: Patient remains intubated per anesthesia plan and Patient placed on Ventilator (see vital sign flow sheet for setting)  Post-op Assessment: Report given to RN and Post -op Vital signs reviewed and stable  Post vital signs: Reviewed and stable  Last Vitals:  Vitals Value Taken Time  BP    Temp    Pulse 80 12/10/24 20:33  Resp 22 12/10/24 20:33  SpO2 97 % 12/10/24 20:33  Vitals shown include unfiled device data.  Last Pain:  Vitals:   12/10/24 0800  TempSrc: Core  PainSc:       Patients Stated Pain Goal: 0 (12/03/24 0400)  Complications: No notable events documented.

## 2024-12-10 NOTE — Op Note (Incomplete)
°   °  [address] [place] [address]             [phone]    12/10/2024 Patient:  Alejandro Lopez Pre-Op Dx: PIERRETTE   Post-op Dx:  *** Procedure: - *** Video assisted thoracoscopy - Decortication - Intercostal nerve block  Surgeon and Role:      * Shyrl Linnie KIDD, MD - Primary    * *** - assisting An experienced assistant was required given the complexity of this surgery and the standard of surgical care. The assistant was needed for exposure, dissection, suctioning, retraction of delicate tissues and sutures, instrument exchange and for overall help during this procedure.     Anesthesia  general EBL:  ***ml Blood Administration: *** Specimen: ***  Drains: 72 F argyle chest tube in *** chest Counts: correct    Indications: *** Findings: ***  Operative Technique: After the risks, benefits and alternatives were thoroughly discussed, the patient was brought to the operative theatre.  Anesthesia was induced, the patient was then placed in a *** decubitus position and was prepped and draped in normal sterile fashion.  An appropriate surgical pause was performed, and pre-operative antibiotics were dosed accordingly.  We began with 2cm incision in the anterior axillary line at the 5th intercostal space.  The chest was entered, and we then placed a 1cm incision at the 10th intercostal space, and introduced our camera port.  The lung was directly visualized.  ***.   The lung was then mobilized off of the chest wall.  ***.  The pleural peal was carefully decorticated off of each lobe.  The fissure was then mobilized.  We achieved good expansion of the lung.  The chest was then irrigated.    An intercostal nerve block was performed under direct visualization.  A 28 F chest tube was then placed, and we watch the lung re-expand.  The skin and soft tissue were closed with absorbable suture    The patient tolerated the procedure without any immediate  complications, and was transferred to the PACU in stable condition.  Illene Sweeting KIDD Shyrl

## 2024-12-10 NOTE — Consult Note (Addendum)
 WOC Nurse ostomy follow up Stoma type/location: RLQ ileostomy. Second PO. Stomal assessment/size: 55 mm x 50 mm. Red, above the skin level, sutures in place. Peristomal assessment: intact, clog around the peri-ostomy site, draining blood around the stoma. Treatment options for stomal/peristomal skin: ring [id number]. Output no output. Ostomy pouching: 1pc. Soft convex [id number].  Education provided: No educational section provided, pt is intubated and no family member present. Enrolled patient in [place] Secure Start Discharge program: No     I will return to apply a midline VAC to this patient. Waiting supplies.  WOC team will follow MON/THURS. Please reconsult if further assistance is needed. Thank-you,  Lela Holm MSN, RN, CWCN, CNS.  (Phone [phone])

## 2024-12-10 NOTE — Progress Notes (Signed)
 Pharmacy Antibiotic Note  Alejandro Lopez is a 43 y.o. 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 Also s/p exploratory laparotomy and small bowel resection. Pharmacy has been consulted for vancomycin  dosing.  Pertinent events:  12/11 - thora, 1L out, IR for embolization, found to have ischemic bowel, underwent ex-lap with partial resection of ileum, open abdomen 12/14 - re-exploration, R colectomy, left in discontinuity 12/16 - OR takeback, end ileostomy, abd closed 12/17 - few C. Albicans in trach aspirate > started on micafungin   12/10/24: D#5 vancomycin , D#6 zosyn , D#2 micafungin  Tm/24h 101.70F, WBC 17 (stable).  SCr stable.  Minimal pressor support (sedation related).  Tentative plan for OR today for chest washout.   12/18 VP @0030  = 28 mcg/mL; VT@0029  = 12 mcg/mL (around 12/17 2200 dose - 1250 mg Q12h regimen).  eAUC 482  Plan: Continue Vancomycin  1250 mg IV every 12 hours  Zosyn  3.375 gm IV every 8 hours Micafungin  per MD Monitor culture results F/u clinical picture and antibiotic LOT  Height: 5' 10 (177.8 cm) Weight: 81.3 kg (179 lb 3.7 oz) IBW/kg (Calculated) : 73  Temp (24hrs), Avg:100.9 F (38.3 C), Min:100.2 F (37.9 C), Max:101.8 F (38.8 C)  Recent Labs  Lab 12/04/24 0441 12/04/24 0749 12/04/24 1016 12/04/24 1648 12/04/24 2155 12/04/24 2217 12/05/24 0044 12/05/24 0212 12/08/24 0410 12/08/24 1630 12/09/24 0337 12/09/24 0403 12/09/24 1447 12/10/24 0029 12/10/24 0617 12/10/24 1005  WBC  --   --   --    < >  --    < >  --    < > 12.1* 12.0* 15.3*  --  17.2*  --  17.1*  --   CREATININE  --   --   --    < >  --   --   --    < > 0.80 0.81  --  0.93 0.87 0.81 0.75  --   LATICACIDVEN 4.7* 3.1* 2.4*  --  2.2*  --  1.8  --   --   --   --   --   --   --   --   --   VANCOTROUGH  --   --   --   --   --   --   --   --   --   --   --   --   --   --   --  12*  VANCOPEAK  --   --   --   --   --   --   --   --   --   --   --    --   --  28*  --   --    < > = values in this interval not displayed.    Estimated Creatinine Clearance: 120.4 mL/min (by C-G formula based on SCr of 0.75 mg/dL).    Allergies[1]  Antimicrobials this admission: Zosyn  12/12 >> Vancomycin  12/14 >> Micafungin  12/17 >> c  Microbiology results: 12/11 Pleural fluid: ngF 12/11 BCx: ngF 12/7 MSSA PCR: positive (MRSA negative) 12/2 Bcx: neF 12/14 TA: rare GPC, few candida albicans 12/17 Bcx: NG1D 12/18 BAL: sent  Thank you for allowing pharmacy to be a part of this patients care.  Maurilio Fila, PharmD Clinical Pharmacist 12/10/2024  3:02 PM     [1] No Known Allergies

## 2024-12-10 NOTE — Plan of Care (Signed)
 Problem: Education: Goal: Knowledge of General Education information will improve Description: Including pain rating scale, medication(s)/side effects and non-pharmacologic comfort measures Outcome: Progressing   Problem: Health Behavior/Discharge Planning: Goal: Ability to manage health-related needs will improve Outcome: Progressing   Problem: Clinical Measurements: Goal: Ability to maintain clinical measurements within normal limits will improve Outcome: Progressing Goal: Will remain free from infection Outcome: Progressing Goal: Diagnostic test results will improve Outcome: Progressing Goal: Respiratory complications will improve Outcome: Progressing Goal: Cardiovascular complication will be avoided Outcome: Progressing   Problem: Activity: Goal: Risk for activity intolerance will decrease Outcome: Progressing   Problem: Nutrition: Goal: Adequate nutrition will be maintained Outcome: Progressing   Problem: Coping: Goal: Level of anxiety will decrease Outcome: Progressing   Problem: Elimination: Goal: Will not experience complications related to bowel motility Outcome: Progressing Goal: Will not experience complications related to urinary retention Outcome: Progressing   Problem: Pain Managment: Goal: General experience of comfort will improve and/or be controlled Outcome: Progressing   Problem: Safety: Goal: Ability to remain free from injury will improve Outcome: Progressing   Problem: Skin Integrity: Goal: Risk for impaired skin integrity will decrease Outcome: Progressing   Problem: Education: Goal: Ability to demonstrate management of disease process will improve Outcome: Progressing Goal: Ability to verbalize understanding of medication therapies will improve Outcome: Progressing Goal: Individualized Educational Video(s) Outcome: Progressing   Problem: Activity: Goal: Capacity to carry out activities will improve Outcome: Progressing    Problem: Cardiac: Goal: Ability to achieve and maintain adequate cardiopulmonary perfusion will improve Outcome: Progressing   Problem: Education: Goal: Understanding of CV disease, CV risk reduction, and recovery process will improve Outcome: Progressing Goal: Individualized Educational Video(s) Outcome: Progressing   Problem: Activity: Goal: Ability to return to baseline activity level will improve Outcome: Progressing   Problem: Cardiovascular: Goal: Ability to achieve and maintain adequate cardiovascular perfusion will improve Outcome: Progressing Goal: Vascular access site(s) Level 0-1 will be maintained Outcome: Progressing   Problem: Health Behavior/Discharge Planning: Goal: Ability to safely manage health-related needs after discharge will improve Outcome: Progressing   Problem: Education: Goal: Understanding of CV disease, CV risk reduction, and recovery process will improve Outcome: Progressing Goal: Individualized Educational Video(s) Outcome: Progressing   Problem: Activity: Goal: Ability to return to baseline activity level will improve Outcome: Progressing   Problem: Cardiovascular: Goal: Ability to achieve and maintain adequate cardiovascular perfusion will improve Outcome: Progressing Goal: Vascular access site(s) Level 0-1 will be maintained Outcome: Progressing   Problem: Health Behavior/Discharge Planning: Goal: Ability to safely manage health-related needs after discharge will improve Outcome: Progressing   Problem: Education: Goal: Will demonstrate proper wound care and an understanding of methods to prevent future damage Outcome: Progressing Goal: Knowledge of disease or condition will improve Outcome: Progressing Goal: Knowledge of the prescribed therapeutic regimen will improve Outcome: Progressing Goal: Individualized Educational Video(s) Outcome: Progressing   Problem: Activity: Goal: Risk for activity intolerance will  decrease Outcome: Progressing   Problem: Cardiac: Goal: Will achieve and/or maintain hemodynamic stability Outcome: Progressing   Problem: Clinical Measurements: Goal: Postoperative complications will be avoided or minimized Outcome: Progressing   Problem: Respiratory: Goal: Respiratory status will improve Outcome: Progressing   Problem: Skin Integrity: Goal: Wound healing without signs and symptoms of infection Outcome: Progressing Goal: Risk for impaired skin integrity will decrease Outcome: Progressing   Problem: Urinary Elimination: Goal: Ability to achieve and maintain adequate renal perfusion and functioning will improve Outcome: Progressing   Problem: Cardiac: Goal: Ability to achieve and maintain adequate cardiopulmonary perfusion  will improve Outcome: Progressing Goal: Vascular access site(s) Level 0-1 will be maintained Outcome: Progressing   Problem: Fluid Volume: Goal: Ability to achieve a balanced intake and output will improve Outcome: Progressing

## 2024-12-10 NOTE — Progress Notes (Signed)
°   °  [address] [place] [address]             [phone]       OR today for R VATS, washout, pericardial window  Keandrea Tapley O Hodaya Curto

## 2024-12-11 ENCOUNTER — Encounter (HOSPITAL_COMMUNITY): Payer: Self-pay | Admitting: Thoracic Surgery (Cardiothoracic Vascular Surgery)

## 2024-12-11 ENCOUNTER — Inpatient Hospital Stay (HOSPITAL_COMMUNITY)

## 2024-12-11 DIAGNOSIS — I5021 Acute systolic (congestive) heart failure: Secondary | ICD-10-CM | POA: Diagnosis not present

## 2024-12-11 LAB — POCT I-STAT 7, (LYTES, BLD GAS, ICA,H+H)
Acid-Base Excess: 3 mmol/L — ABNORMAL HIGH (ref 0.0–2.0)
Bicarbonate: 28.2 mmol/L — ABNORMAL HIGH (ref 20.0–28.0)
Calcium, Ion: 1.19 mmol/L (ref 1.15–1.40)
HCT: 27 % — ABNORMAL LOW (ref 39.0–52.0)
Hemoglobin: 9.2 g/dL — ABNORMAL LOW (ref 13.0–17.0)
O2 Saturation: 99 %
Patient temperature: 37.9
Potassium: 4.1 mmol/L (ref 3.5–5.1)
Sodium: 145 mmol/L (ref 135–145)
TCO2: 30 mmol/L (ref 22–32)
pCO2 arterial: 45.2 mmHg (ref 32–48)
pH, Arterial: 7.407 (ref 7.35–7.45)
pO2, Arterial: 139 mmHg — ABNORMAL HIGH (ref 83–108)

## 2024-12-11 LAB — COMPREHENSIVE METABOLIC PANEL WITH GFR
ALT: 86 U/L — ABNORMAL HIGH (ref 0–44)
AST: 180 U/L — ABNORMAL HIGH (ref 15–41)
Albumin: 1.9 g/dL — ABNORMAL LOW (ref 3.5–5.0)
Alkaline Phosphatase: 128 U/L — ABNORMAL HIGH (ref 38–126)
Anion gap: 8 (ref 5–15)
BUN: 32 mg/dL — ABNORMAL HIGH (ref 6–20)
CO2: 28 mmol/L (ref 22–32)
Calcium: 8.6 mg/dL — ABNORMAL LOW (ref 8.9–10.3)
Chloride: 110 mmol/L (ref 98–111)
Creatinine, Ser: 0.74 mg/dL (ref 0.61–1.24)
GFR, Estimated: >60 mL/min
Glucose, Bld: 123 mg/dL — ABNORMAL HIGH (ref 70–99)
Potassium: 4.2 mmol/L (ref 3.5–5.1)
Sodium: 145 mmol/L (ref 135–145)
Total Bilirubin: 3 mg/dL — ABNORMAL HIGH (ref 0.0–1.2)
Total Protein: 5.2 g/dL — ABNORMAL LOW (ref 6.5–8.1)

## 2024-12-11 LAB — CBC
HCT: 27.8 % — ABNORMAL LOW (ref 39.0–52.0)
Hemoglobin: 9.5 g/dL — ABNORMAL LOW (ref 13.0–17.0)
MCH: 31 pg (ref 26.0–34.0)
MCHC: 34.2 g/dL (ref 30.0–36.0)
MCV: 90.8 fL (ref 80.0–100.0)
Platelets: 178 K/uL (ref 150–400)
RBC: 3.06 MIL/uL — ABNORMAL LOW (ref 4.22–5.81)
RDW: 18.3 % — ABNORMAL HIGH (ref 11.5–15.5)
WBC: 17.2 K/uL — ABNORMAL HIGH (ref 4.0–10.5)
nRBC: 0.9 % — ABNORMAL HIGH (ref 0.0–0.2)

## 2024-12-11 LAB — COOXEMETRY PANEL
Carboxyhemoglobin: 1.8 % — ABNORMAL HIGH (ref 0.5–1.5)
Carboxyhemoglobin: 2.2 % — ABNORMAL HIGH (ref 0.5–1.5)
Methemoglobin: <0.7 % (ref 0.0–1.5)
Methemoglobin: <0.7 % (ref 0.0–1.5)
O2 Saturation: 72.7 %
O2 Saturation: 68.8 %
Total hemoglobin: 8.3 g/dL — ABNORMAL LOW (ref 12.0–16.0)
Total hemoglobin: 9.3 g/dL — ABNORMAL LOW (ref 12.0–16.0)

## 2024-12-11 LAB — GLUCOSE, CAPILLARY
Glucose-Capillary: 119 mg/dL — ABNORMAL HIGH (ref 70–99)
Glucose-Capillary: 110 mg/dL — ABNORMAL HIGH (ref 70–99)
Glucose-Capillary: 113 mg/dL — ABNORMAL HIGH (ref 70–99)
Glucose-Capillary: 119 mg/dL — ABNORMAL HIGH (ref 70–99)
Glucose-Capillary: 96 mg/dL (ref 70–99)
Glucose-Capillary: 123 mg/dL — ABNORMAL HIGH (ref 70–99)

## 2024-12-11 LAB — HEPARIN LEVEL (UNFRACTIONATED)
Heparin Unfractionated: <0.1 [IU]/mL — ABNORMAL LOW (ref 0.30–0.70)
Heparin Unfractionated: <0.1 [IU]/mL — ABNORMAL LOW (ref 0.30–0.70)
Heparin Unfractionated: 0.1 [IU]/mL — ABNORMAL LOW (ref 0.30–0.70)

## 2024-12-11 LAB — MAGNESIUM: Magnesium: 2.1 mg/dL (ref 1.7–2.4)

## 2024-12-11 LAB — LACTATE DEHYDROGENASE: LDH: 829 U/L — ABNORMAL HIGH (ref 105–235)

## 2024-12-11 MED ORDER — POTASSIUM CHLORIDE 10 MEQ/100ML IV SOLN
10.0000 meq | INTRAVENOUS | Status: AC
  Administered 2024-12-11 (×4): 10 meq via INTRAVENOUS
  Filled 2024-12-11 (×4): qty 100

## 2024-12-11 MED ORDER — VITAL 1.5 CAL PO LIQD
1000.0000 mL | ORAL | Status: DC
  Administered 2024-12-11 – 2024-12-12 (×2): 1000 mL
  Filled 2024-12-11: qty 1000

## 2024-12-11 MED ORDER — POTASSIUM CHLORIDE 10 MEQ/100ML IV SOLN: 10.0000 meq | INTRAVENOUS | Status: DC

## 2024-12-11 MED ORDER — MORPHINE SULFATE (PF) 2 MG/ML IV SOLN
1.0000 mg | INTRAVENOUS | Status: DC | PRN
  Administered 2024-12-11 – 2024-12-18 (×9): 1 mg via INTRAVENOUS
  Filled 2024-12-11 (×9): qty 1

## 2024-12-11 MED ORDER — OXYCODONE HCL 5 MG/5ML PO SOLN
5.0000 mg | ORAL | Status: DC | PRN
  Administered 2024-12-11: 10 mg
  Administered 2024-12-11: 5 mg
  Administered 2024-12-12 – 2024-12-22 (×4): 10 mg
  Filled 2024-12-11 (×4): qty 10

## 2024-12-11 MED ORDER — FREE WATER
100.0000 mL | Freq: Four times a day (QID) | Status: DC
  Administered 2024-12-11 – 2024-12-12 (×5): 100 mL

## 2024-12-11 MED ORDER — COLCHICINE 0.6 MG PO TABS
0.6000 mg | ORAL_TABLET | Freq: Every day | ORAL | Status: DC
  Administered 2024-12-11 – 2024-12-20 (×10): 0.6 mg
  Filled 2024-12-11 (×10): qty 1

## 2024-12-11 MED ORDER — TRACE MINERALS CU-MN-SE-ZN 300-55-60-3000 MCG/ML IV SOLN
INTRAVENOUS | Status: AC
  Filled 2024-12-11: qty 875.53

## 2024-12-11 MED ORDER — PROSOURCE TF20 ENFIT COMPATIBL EN LIQD
60.0000 mL | Freq: Every day | ENTERAL | Status: DC
  Administered 2024-12-11 – 2024-12-22 (×12): 60 mL
  Filled 2024-12-11 (×10): qty 60

## 2024-12-11 NOTE — Procedures (Signed)
 Cortrak  Person Inserting Tube:  Merilee, Amy Belloso L, RD Tube Type:  Cortrak - 43 inches Tube Size:  10 Tube Location:  Left nare Secured by: Bridle Initial Placement:  Gastric Technique Used to Measure Tube Placement:  Marking at nare/corner of mouth Cortrak Secured At:  65 cm Initial Placement Verification:  Cortrak device (Registered Dieticians Only)  Cortrak Tube Team Note:  Consult received to place a Cortrak feeding tube.   No x-ray is required. RN may begin using tube.   If the tube becomes dislodged please keep the tube and contact the Cortrak team at [email or web address] for replacement.  If after hours and replacement cannot be delayed, place a NG tube and confirm placement with an abdominal x-ray.    Nestora Merilee RD, LDN Registered Dietitian I Please see AMION for contact information

## 2024-12-11 NOTE — Progress Notes (Addendum)
 ANTICOAGULATION CONSULT NOTE  Pharmacy Consult for heparin  Indication: bA/MVR  Allergies[1]  Patient Measurements: Height: 5' 10 (177.8 cm) Weight: 80 kg (176 lb 5.9 oz) IBW/kg (Calculated) : 73 Heparin  Dosing Weight: 70 kg   Vital Signs: Temp: 99.5 F (37.5 C) (12/19 2215) Temp Source: Bladder (12/19 2000) Pulse Rate: 102 (12/19 2215)  Labs: Recent Labs    12/10/24 0617 12/10/24 0814 12/10/24 1426 12/10/24 2007 12/10/24 2118 12/11/24 0455 12/11/24 0458 12/11/24 0656 12/11/24 1334 12/11/24 2148  HGB 9.6*  --   --    < > 9.5* 9.2* 9.5*  --   --   --   HCT 27.9*  --   --    < > 27.9* 27.0* 27.8*  --   --   --   PLT 135*  --   --   --  158  --  178  --   --   --   HEPARINUNFRC  --    < >  --   --   --   --   --  <0.10* <0.10* 0.10*  CREATININE 0.75  --  0.84  --  0.81  --  0.74  --   --   --    < > = values in this interval not displayed.    Estimated Creatinine Clearance: 120.4 mL/min (by C-G formula based on SCr of 0.74 mg/dL).  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  Postop course complicated by ischemic bowel s/p exlap 12/11 with partial small bowel resection, abdomen left open.  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   Per Dr. Lucas, plan for 3 months of warfarin therapy with dual valve replacement. Not on anticoagulation prior to admission.  Pharmacy consulted for heparin  dosing.  Fixed heparin  500 units/h restarted postop 12/17 after discussing with CCM, HF, and CCS but s/p chest washout yesterday (12/18).  Heparin  level earlier this afternoon came back <0.1, on 650 units/hr so rate was increased to 800 units/hr. Heparin  level this evening came low at 0.1, on 800 units/hr.  No s/sx of bleeding or infusion issues. Impella running stable at P5 - no issues.   Goal of Therapy:  HL 0.3-0.5 Monitor platelets by anticoagulation protocol: Yes   Plan:  Increase heparin  infusion at 900 units/hr 6h HL Monitor ostomy output, bleeding  Thank you for allowing pharmacy to participate in this patient's care,  Suzen Sour, PharmD, BCCCP Clinical Pharmacist  Phone: [phone] 12/11/2024 10:41 PM  Please check AMION for all [facility] phone numbers After 10:00 PM, call Main Pharmacy [phone]       [1] No Known Allergies

## 2024-12-11 NOTE — Progress Notes (Signed)
 ANTICOAGULATION CONSULT NOTE  Pharmacy Consult for heparin  Indication: bA/MVR  Allergies[1]  Patient Measurements: Height: 5' 10 (177.8 cm) Weight: 80 kg (176 lb 5.9 oz) IBW/kg (Calculated) : 73 Heparin  Dosing Weight: 70 kg   Vital Signs: Temp: 100 F (37.8 C) (12/19 0700) Temp Source: Core (12/18 2100) Pulse Rate: 89 (12/19 0700)  Labs: Recent Labs    12/09/24 1447 12/10/24 0029 12/10/24 0617 12/10/24 0814 12/10/24 1426 12/10/24 2007 12/10/24 2118 12/11/24 0455 12/11/24 0458  HGB 10.4*   < > 9.6*  --   --    < > 9.5* 9.2* 9.5*  HCT 30.3*   < > 27.9*  --   --    < > 27.9* 27.0* 27.8*  PLT 108*  --  135*  --   --   --  158  --  178  HEPARINUNFRC <0.10*  --   --  <0.10*  --   --   --   --   --   CREATININE 0.87   < > 0.75  --  0.84  --  0.81  --  0.74   < > = values in this interval not displayed.    Estimated Creatinine Clearance: 120.4 mL/min (by C-G formula based on SCr of 0.74 mg/dL).  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  Postop course complicated by ischemic bowel s/p exlap 12/11 with partial small bowel resection, abdomen left open.  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   Per Dr. Lucas, plan for 3 months of warfarin therapy with dual valve replacement. Not on anticoagulation prior to admission.  Pharmacy consulted for heparin  dosing.  Fixed heparin  500 units/h restarted postop 12/17 after discussing with CCM, HF, and CCS.  S/p chest washout yesterday (12/18). HL undetectable as expected.  Hgb 9.5 stable, pltc continue to trend up, LDH back up today 829.   Per discussion with CCM, TCTS, and HF - plan to start slowing titrating today.  Goal of Therapy:  HL  0.3-0.5 Monitor platelets by anticoagulation protocol: Yes   Plan:  Increase heparin  IV 650 units/h 6h HL Monitor ostomy output, bleeding  Maurilio Fila, PharmD Clinical Pharmacist 12/11/2024  7:10 AM      [1] No Known Allergies

## 2024-12-11 NOTE — Progress Notes (Signed)
 1 Day Post-Op   Subjective/Chief Complaint: OR again yesterday   Objective: Vital signs in last 24 hours: Temp:  [98.2 F (36.8 C)-101.3 F (38.5 C)] 100 F (37.8 C) (12/19 0700) Pulse Rate:  [68-89] 89 (12/19 0700) Resp:  [0-27] 24 (12/19 0700) BP: (86-102)/(67-80) 93/77 (12/18 1800) SpO2:  [94 %-100 %] 99 % (12/19 0700) Arterial Line BP: (95-126)/(56-76) 122/71 (12/19 0700) FiO2 (%):  [40 %] 40 % (12/19 0335) Weight:  [80 kg] 80 kg (12/19 0500) Last BM Date : 12/10/24  Intake/Output from previous day: 12/18 0701 - 12/19 0700 In: 4297.3 [I.V.:2516.3; IV Piggyback:1446.6] Out: 5334 [Urine:3635; Emesis/NG output:380; Stool:385; Chest Tube:265] Intake/Output this shift: No intake/output data recorded.  Ab soft ileostomy pink, vac in place  Lab Results:  Recent Labs    12/10/24 2118 12/11/24 0455 12/11/24 0458  WBC 18.7*  --  17.2*  HGB 9.5* 9.2* 9.5*  HCT 27.9* 27.0* 27.8*  PLT 158  --  178   BMET Recent Labs    12/10/24 2118 12/11/24 0455 12/11/24 0458  NA 145 145 145  K 3.8 4.1 4.2  CL 108  --  110  CO2 29  --  28  GLUCOSE 135*  --  123*  BUN 35*  --  32*  CREATININE 0.81  --  0.74  CALCIUM  8.7*  --  8.6*   PT/INR No results for input(s): LABPROT, INR in the last 72 hours. ABG Recent Labs    12/10/24 2007 12/11/24 0455  PHART 7.476* 7.407  HCO3 27.2 28.2*    Studies/Results: DG CHEST PORT 1 VIEW Result Date: 12/10/2024 CLINICAL DATA:  Post cardiac surgery. EXAM: PORTABLE CHEST 1 VIEW COMPARISON:  12/09/2024 FINDINGS: Right subclavian Impella device unchanged. Endotracheal tube has tip 5.4 cm above the carina. Nasogastric tube courses into the region of the stomach where it is looped as tip is not visualized. Left IJ Swan-Ganz catheter has tip over the right main pulmonary artery. Right-sided PICC line with tip over the SVC. Prosthetic heart valves unchanged. Lungs are hypoinflated with moderate size right pleural effusion likely with  associated basilar atelectasis unchanged. No pneumothorax. Mild cardiomegaly. Remainder of the exam is unchanged. IMPRESSION: 1. Hypoinflation with moderate size right pleural effusion likely with associated basilar atelectasis unchanged. 2. Tubes and lines as described. Electronically Signed   By: Toribio Agreste M.D.   On: 12/10/2024 09:11   VAS US  LOWER EXTREMITY VENOUS (DVT) Result Date: 12/09/2024  Lower Venous DVT Study Patient Name:  Alejandro Lopez  Date of Exam:   12/09/2024 Medical Rec #: [id number]         Accession #:    [id number] Date of Birth: 1981-08-17        Patient Gender: M Patient Age:   43 years Exam Location:  [facility] Procedure:      VAS US  LOWER EXTREMITY VENOUS (DVT) Referring Phys: TORIBIO SHARPS --------------------------------------------------------------------------------  Indications: Edema.  Risk Factors: None identified. Limitations: Poor ultrasound/tissue interface and patient positioning, patient immobility. Comparison Study: No prior studies. Performing Technologist: Cordella Collet RVT  Examination Guidelines: A complete evaluation includes B-mode imaging, spectral Doppler, color Doppler, and power Doppler as needed of all accessible portions of each vessel. Bilateral testing is considered an integral part of a complete examination. Limited examinations for reoccurring indications may be performed as noted. The reflux portion of the exam is performed with the patient in reverse Trendelenburg.  +---------+---------------+---------+-----------+----------+--------------+ RIGHT    CompressibilityPhasicitySpontaneityPropertiesThrombus Aging +---------+---------------+---------+-----------+----------+--------------+ CFV  Full           Yes      Yes                                 +---------+---------------+---------+-----------+----------+--------------+ SFJ      Full                                                         +---------+---------------+---------+-----------+----------+--------------+ FV Prox  Full                                                        +---------+---------------+---------+-----------+----------+--------------+ FV Mid   Full                                                        +---------+---------------+---------+-----------+----------+--------------+ FV DistalFull                                                        +---------+---------------+---------+-----------+----------+--------------+ PFV      Full                                                        +---------+---------------+---------+-----------+----------+--------------+ POP      Full           Yes      Yes                                 +---------+---------------+---------+-----------+----------+--------------+ PTV      Full                                                        +---------+---------------+---------+-----------+----------+--------------+ PERO     Full                                                        +---------+---------------+---------+-----------+----------+--------------+   +---------+---------------+---------+-----------+----------+--------------+ LEFT     CompressibilityPhasicitySpontaneityPropertiesThrombus Aging +---------+---------------+---------+-----------+----------+--------------+ CFV      Full           Yes      Yes                                 +---------+---------------+---------+-----------+----------+--------------+  SFJ      Full                                                        +---------+---------------+---------+-----------+----------+--------------+ FV Prox  Full                                                        +---------+---------------+---------+-----------+----------+--------------+ FV Mid   Full                                                         +---------+---------------+---------+-----------+----------+--------------+ FV Distal               Yes      Yes                                 +---------+---------------+---------+-----------+----------+--------------+ PFV      Full                                                        +---------+---------------+---------+-----------+----------+--------------+ POP      Full           Yes      Yes                                 +---------+---------------+---------+-----------+----------+--------------+ PTV      Full                                                        +---------+---------------+---------+-----------+----------+--------------+ PERO     Full                                                        +---------+---------------+---------+-----------+----------+--------------+     Summary: RIGHT: - There is no evidence of deep vein thrombosis in the lower extremity. However, portions of this examination were limited- see technologist comments above.  - No cystic structure found in the popliteal fossa.  LEFT: - There is no evidence of deep vein thrombosis in the lower extremity. However, portions of this examination were limited- see technologist comments above.  - No cystic structure found in the popliteal fossa.  *See table(s) above for measurements and observations. Electronically signed by Gaile New MD on 12/09/2024 at 8:42:38 PM.    Final    ECHOCARDIOGRAM LIMITED Result Date: 12/09/2024    ECHOCARDIOGRAM LIMITED REPORT  Patient Name:   Alejandro Lopez Date of Exam: 12/09/2024 Medical Rec #:  [id number]        Height:       70.0 in Accession #:    [id number]       Weight:       179.7 lb Date of Birth:  July 15, 1981       BSA:          1.994 m Patient Age:    43 years         BP:           110/88 mmHg Patient Gender: M                HR:           98 bpm. Exam Location:  Inpatient Procedure: Limited Echo (Both Spectral and Color Flow Doppler were utilized             during procedure). Indications:    Impella Support  History:        Patient has prior history of Echocardiogram examinations, most                 recent 12/08/2024.  Sonographer:    Jayson Gaskins Referring Phys: [phone] ALMA L DIAZ IMPRESSIONS  1. Left ventricular ejection fraction, by estimation, is 25 to 30%. The left ventricle has severely decreased function. The left ventricle demonstrates global hypokinesis.  2. Right ventricular systolic function is moderately reduced.  3. Moderate pericardial effusion. The pericardial effusion is circumferential.  4. The mitral valve has been repaired/replaced.  5. The aortic valve has been repaired/replaced. Conclusion(s)/Recommendation(s): Limited echo to assess Impell position. Impella well postioned with inflow cage 5.27 cm from AoV. FINDINGS  Left Ventricle: Left ventricular ejection fraction, by estimation, is 25 to 30%. The left ventricle has severely decreased function. The left ventricle demonstrates global hypokinesis. Right Ventricle: Right ventricular systolic function is moderately reduced. Pericardium: A moderately sized pericardial effusion is present. The pericardial effusion is circumferential. The pericardial effusion appears to contain mixed echogenic material. Mitral Valve: The mitral valve has been repaired/replaced. There is a bioprosthetic valve present in the mitral position. Aortic Valve: The aortic valve has been repaired/replaced. There is a bioprosthetic valve present in the aortic position. Toribio Fuel MD Electronically signed by Toribio Fuel MD Signature Date/Time: 12/09/2024/7:20:05 PM    Final    CT CHEST ABDOMEN PELVIS WO CONTRAST Result Date: 12/09/2024 EXAM: CT CHEST, ABDOMEN AND PELVIS WITHOUT CONTRAST 12/09/2024 10:52:00 AM TECHNIQUE: CT of the chest, abdomen and pelvis was performed without the administration of intravenous contrast. Multiplanar reformatted images are provided for review. Automated exposure control,  iterative reconstruction, and/or weight based adjustment of the mA/kV was utilized to reduce the radiation dose to as low as reasonably achievable. COMPARISON: CT of the chest, abdomen and pelvis dated 12/03/2024. CLINICAL HISTORY: evaluate right hemothorax evaluate right hemothorax FINDINGS: CHEST: MEDIASTINUM AND LYMPH NODES: Heart is enlarged and the patient is status post aortic and mitral valvular replacement. An Impella device is again noted within the left ventricle. A Swan-Ganz catheter is also present with its tip in the right pulmonary artery. There is a moderate pericardial effusion, which has increased in size since the previous study. An endotracheal tube is in good position above the carina. No mediastinal, hilar or axillary lymphadenopathy. LUNGS AND PLEURA: Consolidation/atelectasis present dependently within the right lung. The right lower lobe is nearly completely opacified. There are biapical pulmonary blebs. A right-sided pigtail chest catheter  remains in place. There is a small volume of pleural fluid. No pneumothorax. ABDOMEN AND PELVIS: LIVER: The liver is unremarkable. GALLBLADDER AND BILE DUCTS: Radiopaque material layering dependently within the gallbladder. No biliary ductal dilatation. SPLEEN: No acute abnormality. PANCREAS: No acute abnormality. ADRENAL GLANDS: No acute abnormality. KIDNEYS, URETERS AND BLADDER: No stones in the kidneys or ureters. No hydronephrosis. No perinephric or periureteral stranding. Foley catheter is present within the urinary bladder. GI AND BOWEL: Stomach demonstrates no acute abnormality. An enteric catheter has been placed and its tip is in the distal stomach. The patient is status post right colectomy. 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 There is no bowel obstruction. REPRODUCTIVE ORGANS: No acute abnormality. PERITONEUM AND RETROPERITONEUM: No ascites. No free air. VASCULATURE: Aorta is normal in caliber. ABDOMINAL AND PELVIS LYMPH NODES: No  lymphadenopathy. BONES AND SOFT TISSUES: Patient is status post sternotomy. A right arm PICC is present with its tip at the superior atrial caval junction. There is moderate soft tissue edema within the subcutaneous fat of the abdomen and pelvis. No acute osseous abnormality. IMPRESSION: 1. Small residual right pleural fluid/hemothorax with right-sided pigtail chest catheter in place and dependent right lung consolidation/atelectasis with near complete right lower lobe opacification. 2. Moderate pericardial effusion, increased in size since the previous study. 3. Status post aortic and mitral valvular replacement with Impella device in the left ventricle and Swan-Ganz catheter in the right pulmonary artery. Additional support devices include endotracheal tube, enteric tube with tip in the distal stomach, and right upper extremity PICC with tip at the superior cavoatrial junction. 4. Status post right colectomy with right periumbilical ileostomy. 5. Radiopaque material layering within the gallbladder, which can be seen with vicarious excretion of contrast or cholelithiasis. 6. Moderate subcutaneous soft tissue edema of the abdomen and pelvis. Electronically signed by: Evalene Coho MD 12/09/2024 11:21 AM EST RP Workstation: [id number]    Anti-infectives: Anti-infectives (From admission, onward)    Start     Dose/Rate Route Frequency Ordered Stop   12/10/24 1000  micafungin  (MYCAMINE ) 100 mg in sodium chloride  0.9 % 100 mL IVPB  Status:  Discontinued       Placed in Followed by Linked Group   100 mg 105 mL/hr over 1 Hours Intravenous Every 24 hours 12/09/24 0704 12/11/24 0710   12/09/24 0830  micafungin  (MYCAMINE ) 200 mg in sodium chloride  0.9 % 100 mL IVPB       Placed in Followed by Linked Group   200 mg 110 mL/hr over 1 Hours Intravenous  Once 12/09/24 0704 12/09/24 1101   12/06/24 2200  vancomycin  (VANCOREADY) IVPB 1250 mg/250 mL        1,250 mg 166.7 mL/hr over 90 Minutes Intravenous Every  12 hours 12/06/24 0811     12/06/24 0900  vancomycin  (VANCOREADY) IVPB 1750 mg/350 mL        1,750 mg 175 mL/hr over 120 Minutes Intravenous  Once 12/06/24 0802 12/06/24 1032   12/04/24 1100  vancomycin  (VANCOCIN ) IVPB 1000 mg/200 mL premix  Status:  Discontinued        1,000 mg 200 mL/hr over 60 Minutes Intravenous Every 12 hours 12/03/24 2238 12/03/24 2242   12/04/24 0000  vancomycin  (VANCOREADY) IVPB 1500 mg/300 mL  Status:  Discontinued        1,500 mg 150 mL/hr over 120 Minutes Intravenous  Once 12/03/24 2236 12/03/24 2242   12/03/24 2300  piperacillin -tazobactam (ZOSYN ) IVPB 3.375 g        3.375 g 12.5 mL/hr over  240 Minutes Intravenous Every 8 hours 12/03/24 2236     11/30/24 2130  vancomycin  (VANCOCIN ) IVPB 1000 mg/200 mL premix        1,000 mg 200 mL/hr over 60 Minutes Intravenous  Once 11/30/24 1609 11/30/24 2351   11/30/24 1615  ceFAZolin  (ANCEF ) IVPB 2g/100 mL premix        2 g 200 mL/hr over 30 Minutes Intravenous Every 8 hours 11/30/24 1609 12/02/24 0546   11/30/24 0400  vancomycin  (VANCOREADY) IVPB 1250 mg/250 mL        1,250 mg 166.7 mL/hr over 90 Minutes Intravenous To Surgery 11/29/24 1040 11/30/24 0856   11/30/24 0400  ceFAZolin  (ANCEF ) IVPB 2g/100 mL premix        2 g 200 mL/hr over 30 Minutes Intravenous To Surgery 11/29/24 1040 11/30/24 1239   11/30/24 0400  ceFAZolin  (ANCEF ) IVPB 2g/100 mL premix  Status:  Discontinued        2 g 200 mL/hr over 30 Minutes Intravenous To Surgery 11/29/24 1036 11/30/24 1609       Assessment/Plan: POD 7/5/3 status post ex lap with SBR & VAC placement, repeat laparotomy with patchy small bowel ischemia, end ileostomy/ab closure -woc following -vac changes -can try tube feeds when ready, just trickle    Alejandro Lopez 12/11/2024

## 2024-12-11 NOTE — Progress Notes (Signed)
 5mg /2ml liquid oxycodone  (roxicodone ) wasted at this time with Luke Sour, PharmD.

## 2024-12-11 NOTE — Progress Notes (Signed)
 Patient ID: Alejandro Lopez, male   DOB: 25-May-1981, 43 y.o.   MRN: [id number]     Advanced Heart Failure Rounding Note  Cardiologist: None  Chief Complaint: Valvular Heart Disease & HFrEF Patient Profile  43 y.o. male w/ h/o heavy alcohol use (at least 5 drinks per evening) and h/o asthma admitted w/ acute systolic heart failure. Strong family history of cardiac disease: mother and maternal grandmother had heart failure and father with CAD. Admitted with acute HFrEF/cardiogenic shock.   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  12/9 VT/VF ? vagal episode. Extubated 12/11: S/p R thoracentesis with resultant hemothorax.  Chest tube placed. S/p R intercostal artery coil (IR). S/p exp lap for ischemic bowel resection, bowel left in discontinuity 12/15: Back to OR for ischemic R colon and mesenteric ischemia. S/p exp/lap, wound vac exchange, R colectomy without anastomosis.  12/16 Back to OR for small bowel resection and ileostomy  12/18 OR for VATS/hemothorax washout, pericardial window was not done.   Subjective:    Impella 5.5  Flow (Liters/min): 3.9 Liters/min Performance Level: P7 Recent Labs    12/09/24 0337 12/10/24 0617 12/11/24 0458  LDH 801* 715* 829*    Heparin  gtt started 12/17.  - Plts 178 (trending up), Hgb 9.5  Remains on 0.25 milrinone  + 5 NE.   NSR today but had short run of atrial flutter yesterday.   Swan: TD CI 3.07 PA 51/20 CVP 8 Co-ox 73%  Fever trending down, T max 100.4 overnight. He remains on vancomycin /Zosyn /micafungin     Objective:   Weight Range: 80 kg Body mass index is 25.31 kg/m.   Vital Signs:   Temp:  [98.2 F (36.8 C)-101.5 F (38.6 C)] 100 F (37.8 C) (12/19 0700) Pulse Rate:  [68-89] 89 (12/19 0700) Resp:  [0-27] 24 (12/19 0700) BP: (86-102)/(67-80) 93/77 (12/18 1800) SpO2:  [94 %-100 %] 99 % (12/19 0700) Arterial Line BP: (95-126)/(56-76) 122/71 (12/19 0700) FiO2 (%):  [40 %] 40 %  (12/19 0335) Weight:  [80 kg] 80 kg (12/19 0500) Last BM Date : 12/10/24  Weight change: Filed Weights   12/09/24 0420 12/10/24 0500 12/11/24 0500  Weight: 81.5 kg 81.3 kg 80 kg    Intake/Output:   Intake/Output Summary (Last 24 hours) at 12/11/2024 0702 Last data filed at 12/11/2024 0600 Gross per 24 hour  Intake 4214.92 ml  Output 4535 ml  Net -320.08 ml     Physical Exam   General: Intubated.  Neck: No JVD, no thyromegaly or thyroid nodule.  Lungs: Decreased at bases. CV: Nondisplaced PMI.  Heart regular S1/S2, no S3/S4, no murmur.  No peripheral edema.   Abdomen: Soft, nontender, no hepatosplenomegaly, no distention.  Skin: Intact without lesions or rashes.  Neurologic: Will wake up, follow commands.  Extremities: No clubbing or cyanosis.  HEENT: Normal.    Telemetry   NSR 90s, had short AFL run (personally reviewed)  Labs    CBC Recent Labs    12/10/24 2118 12/11/24 0455 12/11/24 0458  WBC 18.7*  --  17.2*  HGB 9.5* 9.2* 9.5*  HCT 27.9* 27.0* 27.8*  MCV 90.6  --  90.8  PLT 158  --  178   Basic Metabolic Panel Recent Labs    87/82/74 0403 12/09/24 1447 12/10/24 0617 12/10/24 1426 12/10/24 2118 12/11/24 0455 12/11/24 0458  NA 146*   < > 146*   < > 145 145 145  K 3.8   < > 4.0   < >  3.8 4.1 4.2  CL 108   < > 109   < > 108  --  110  CO2 29   < > 28   < > 29  --  28  GLUCOSE 125*   < > 126*   < > 135*  --  123*  BUN 31*   < > 34*   < > 35*  --  32*  CREATININE 0.93   < > 0.75   < > 0.81  --  0.74  CALCIUM  8.5*   < > 8.7*   < > 8.7*  --  8.6*  MG  --    < > 2.7*   < > 1.7  --  2.1  PHOS 3.5  --  3.7  --   --   --   --    < > = values in this interval not displayed.   Liver Function Tests Recent Labs    12/10/24 0617 12/11/24 0458  AST 155* 180*  ALT 58* 86*  ALKPHOS 95 128*  BILITOT 3.2* 3.0*  PROT 5.1* 5.2*  ALBUMIN  2.0* 1.9*   No results for input(s): LIPASE, AMYLASE in the last 72 hours. Cardiac Enzymes No results for  input(s): CKTOTAL, CKMB, CKMBINDEX, TROPONINI in the last 72 hours.  BNP: BNP (last 3 results) No results for input(s): BNP in the last 8760 hours.  ProBNP (last 3 results) Recent Labs    11/22/24 1958  PROBNP 3,354.0*     D-Dimer No results for input(s): DDIMER in the last 72 hours.  Hemoglobin A1C No results for input(s): HGBA1C in the last 72 hours. Fasting Lipid Panel Recent Labs    12/10/24 0029  TRIG 212*   Thyroid Function Tests No results for input(s): TSH, T4TOTAL, T3FREE, THYROIDAB in the last 72 hours.  Invalid input(s): FREET3  Other results:   Imaging    DG CHEST PORT 1 VIEW Result Date: 12/04/2024 CLINICAL DATA:  Ventilator dependence. EXAM: PORTABLE CHEST 1 VIEW COMPARISON:  12/03/2024 FINDINGS: Endotracheal tube tip is approximately 3.8 cm above the base of the carina. The NG tube passes into the stomach although the distal tip position is not included on the film. Right-sided Impella device is stable in position. A left IJ pulmonary artery catheter tip overlies expected location of the interlobar pulmonary artery. Right pleural drain again noted with persistent right pleural fluid. Basilar collapse/consolidation noted, IMPRESSION: 1. No substantial interval change. 2. Persistent right pleural effusion with right base collapse/consolidation. 3. Support apparatus as above. Electronically Signed   By: Camellia Candle M.D.   On: 12/04/2024 07:15   DG Abd 1 View Result Date: 12/04/2024 CLINICAL DATA:   Routine adult health maintenance Best images obtainable due to patient's condition EXAM: ABDOMEN - 1 VIEW COMPARISON:  12/04/2024 FINDINGS: NG tube tip is in the distal stomach. Diffuse gaseous distention of colon evident, similar to prior. IMPRESSION: NG tube tip is in the distal stomach. Electronically Signed   By: Camellia Candle M.D.   On: 12/04/2024 07:11   CT Angio Chest/Abd/Pel for Dissection W and/or W/WO Addendum Date:  12/03/2024 ADDENDUM #1 ADDENDUM: ---------------------------------------------------- These results were called to Dr. Daniel  at 10:15 pm on 12/03/2024. Electronically signed by: Franky Crease MD 12/03/2024 10:23 PM EST RP Workstation: [id number]   Result Date: 12/03/2024 ORIGINAL REPORT EXAM: CT CHEST, ABDOMEN AND PELVIS WITH AND WITHOUT CONTRAST 12/03/2024 09:52:07 PM TECHNIQUE: CT of the chest, abdomen and pelvis was performed with and without the administration of intravenous  contrast. Multiplanar reformatted images are provided for review. Automated exposure control, iterative reconstruction, and/or weight based adjustment of the mA/kV was utilized to reduce the radiation dose to as low as reasonably achievable. COMPARISON: None available. CLINICAL HISTORY: Acute aortic syndrome (AAS) suspected; Evaluate for intercostal bleeding or other sources after thoracentesis; also evaluate bowel. FINDINGS: CHEST: MEDIASTINUM AND LYMPH NODES: Mild cardiomegaly. Impella device tip in the left ventricle. Swan-ganz catheter tip in the central right pulmonary artery. No evidence of aortic aneurysm or dissection. The central airways are clear. No mediastinal, hilar or axillary lymphadenopathy. LUNGS AND PLEURA: Right chest tube in place. Moderate to large right pleural effusion with compressive atelectasis in the right middle lobe and right lower lobe. There appears to be active extravasation of contrast posteriorly between the right 9th and 10th ribs and likely into the pleural space, possibly related to prior thoracentesis. The pleural fluid appears complex on the right, likely hemothorax. Small left pleural effusion with compressive atelectasis in the left lower lobe. Tiny right pneumothorax. Biapical subpleural blebs. No evidence of pulmonary embolus. ABDOMEN AND PELVIS: LIVER: Gas is seen branching peripherally in the liver compatible with portal venous gas. GALLBLADDER AND BILE DUCTS: Gallbladder is unremarkable. No  biliary ductal dilatation. SPLEEN: No acute abnormality. PANCREAS: No acute abnormality. ADRENAL GLANDS: No acute abnormality. KIDNEYS, URETERS AND BLADDER: No stones in the kidneys or ureters. No hydronephrosis. No perinephric or periureteral stranding. Urinary bladder is unremarkable. GI AND BOWEL: Stomach and small bowel are decompressed. Diffuse gaseous distention of the colon suggests ileus. Moderate stool burden in the rectosigmoid colon. Pneumatosis noted in small bowel loops in the lower pelvis with air in the mesenteric veins feeding these small bowel loops. Pneumatosis also noted in the right colon wall. REPRODUCTIVE ORGANS: No acute abnormality. PERITONEUM AND RETROPERITONEUM: No ascites. No free air. VASCULATURE: Aorta is normal in caliber. ABDOMINAL AND PELVIS LYMPH NODES: No lymphadenopathy. BONES AND SOFT TISSUES: No acute osseous abnormality. No focal soft tissue abnormality. IMPRESSION: 1. Active contrast extravasation between the right 9th and 10th ribs with likely extension into the pleural space, suspicious for ongoing intercostal bleeding likely related to recent thoracentesis. 2. Moderate to large right pleural effusion compatible with hemothorax with compressive atelectasis in the right middle and lower lobes; right chest tube present; tiny right pneumothorax. 3. No evidence of aortic aneurysm, aortic dissection, or pulmonary embolus. 4. Pneumatosis involving small bowel loops in the lower pelvis and the right colon with mesenteric venous gas and portal venous gas, concerning for bowel ischemia. Recommend surgical consultation. 5. Small left pleural effusion with compressive atelectasis in the left lower lobe. 6. . Attempts are being made to contact the ordering physician with the results. An addendum will be made at that time. Electronically signed by: Franky Crease MD 12/03/2024 10:09 PM EST RP Workstation: [id number]   DG Chest Port 1 View Result Date: 12/03/2024 EXAM: 1 VIEW(S) XRAY OF  THE CHEST 12/03/2024 07:57:00 PM COMPARISON: Portable chest today at 7:02 pm. CLINICAL HISTORY: Encounter for chest tube placement. FINDINGS: LINES, TUBES AND DEVICES: A pigtail chest tube has been inserted on the right through the 6th intercostal space, with the pigtail in the lateral upper right thorax. Left IJ Swan-Ganz line again terminates in the distal right pulmonary artery. Stable positioning of right subclavian Impella device. LUNGS AND PLEURA: No pneumothorax. Moderate to large right pleural effusion is still present but decreased in the interval. Small left pleural effusion unchanged. The right mid to lower lung is obscured by pleural  fluid. There is patchy consolidation in the left lower lung field. Left mid and both apical lungs are clear. HEART AND MEDIASTINUM: Sternotomy sutures and two cardiac valve replacements are again shown. There is mild cardiomegaly and mild central vascular prominence as before. BONES AND SOFT TISSUES: No acute osseous abnormality. IMPRESSION: 1. Right pigtail chest tube in position with no measurable pneumothorax. 2. Moderate to large right pleural effusion, decreased compared to earlier today. 3. Patchy consolidation in the left lower lung field. 4. Small left pleural effusion, unchanged. 5. Cardiomegaly . Electronically signed by: Francis Quam MD 12/03/2024 08:10 PM EST RP Workstation: [id number]   DG CHEST PORT 1 VIEW Result Date: 12/03/2024 EXAM: 1 VIEW(S) XRAY OF THE CHEST 12/03/2024 07:05:00 PM COMPARISON: None available. CLINICAL HISTORY: ABLA (acute blood loss anemia). FINDINGS: LINES, TUBES AND DEVICES: Impella device and Swan-Ganz catheter remain in place, unchanged. LUNGS AND PLEURA: Enlarging right pleural effusion, now large with atelectasis throughout the right lung. Vascular congestion and left basilar atelectasis. Low lung volumes. No pneumothorax. HEART AND MEDIASTINUM: No acute abnormality of the cardiac and mediastinal silhouettes. BONES AND SOFT  TISSUES: No acute osseous abnormality. IMPRESSION: 1. Enlarging right pleural effusion, now large, with associated right lung atelectasis. 2. Vascular congestion with left basilar atelectasis. 3. Low lung volumes. Electronically signed by: Franky Crease MD 12/03/2024 07:10 PM EST RP Workstation: [id number]   DG Chest Port 1 View Result Date: 12/03/2024 CLINICAL DATA:  Pleural effusion, status post thoracentesis EXAM: PORTABLE CHEST 1 VIEW COMPARISON:  12/03/2024 FINDINGS: Single frontal view of the chest demonstrates stable left internal jugular flow directed central venous catheter, tip overlying the right infrahilar region. Stable mitral and aortic valve prostheses. Cardiac silhouette remains enlarged. Lung volumes are diminished, with bibasilar veiling opacities, right greater than left, consistent with consolidation and effusions. No appreciable change since prior study. No evidence of pneumothorax. IMPRESSION: 1. Persistent bibasilar consolidation and effusions, right greater than left. 2. No evidence of pneumothorax. 3. Stable enlarged cardiac silhouette. Electronically Signed   By: Ozell Daring M.D.   On: 12/03/2024 16:10    Medications:   Scheduled Medications:  arformoterol   15 mcg Nebulization BID   Chlorhexidine  Gluconate Cloth  6 each Topical Daily   folic acid   1 mg Intravenous Daily   furosemide   80 mg Intravenous BID   insulin  aspart  0-9 Units Subcutaneous Q4H   lidocaine   1 patch Transdermal Q24H   mouth rinse  15 mL Mouth Rinse Q2H   pantoprazole  (PROTONIX ) IV  40 mg Intravenous Daily   revefenacin   175 mcg Nebulization Daily   sodium chloride  flush  10 mL Intrapleural Q8H   sodium chloride  flush  3 mL Intravenous Q12H   thiamine  (VITAMIN B1) injection  100 mg Intravenous Daily    Infusions:  sodium chloride  20 mL/hr at 12/11/24 0312   acetaminophen      amiodarone  30 mg/hr (12/11/24 0600)   dexmedetomidine  (PRECEDEX ) IV infusion 0.5 mcg/kg/hr (12/11/24 0600)   fentaNYL   infusion INTRAVENOUS 175 mcg/hr (12/11/24 0600)   heparin  Stopped (12/10/24 1812)   micafungin  (MYCAMINE ) 100 mg in sodium chloride  0.9 % 100 mL IVPB Stopped (12/10/24 1557)   milrinone  0.25 mcg/kg/min (12/11/24 0600)   norepinephrine  (LEVOPHED ) Adult infusion 5 mcg/min (12/11/24 0600)   piperacillin -tazobactam 3.375 g (12/11/24 0653)   propofol  (DIPRIVAN ) infusion 40 mcg/kg/min (12/11/24 0600)   sodium bicarbonate  25 mEq (Impella PURGE) in dextrose  5 % 1000 mL bag     TPN ADULT (ION) 72 mL/hr at 12/10/24 1800  vancomycin  Stopped (12/10/24 2307)    PRN Medications: sodium chloride , sodium chloride , albuterol , fentaNYL , fentaNYL  (SUBLIMAZE ) injection, hydrALAZINE , ondansetron  (ZOFRAN ) IV, mouth rinse, mouth rinse, sodium chloride , sodium chloride  flush  Assessment/Plan  1.  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  Valvular Disease  - severe MR posteriorly directed, mod TR. Likely functional.  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 Acute Systolic Heart Failure w/ Biventricular Dysfunction >> caridogenic shock - HS trop not c/w ACS but EKG w/ anteroseptal Qs  - CMRI Severe AR/MR. LVEF 35%  - Cath- normal cors, RA5, PA 32/17 (24), PVR 2.57 Papi 3. CO 4.9 CI 3.8 - Post-op Echo 12/02/24: EF < 20% with severe LVH (new, ?inflammatory), moderately decreased RV function, stable bioprosthetic MV and AoV.  - Impella @ P7 Flow 3.9.  CI 3.07. Co-ox 73%. Remains on 0.25 milrinone  + 5 NE (up and down with sedation).  On heparin  gtt, LDH stable and plts trending up.  - Work on weaning Impella.  Decrease Impella to P6 this morning, drop to P5 this afternoon if stable.  - Holding digoxin  - Holding GDMT until back off pressors.   3. R hemothorax - Hemothorax s/p right thoracentesis with intercostal artery injury. Massive bleeding requiring multiple products and development of hemorrhagic shock, now improved.  S/p IR embolization.  - S/p VATS 12/18 with right chest washout.  - Back on low dose heparin  - Chest tube in place, 265 cc output last 24 hrs  4. Transaminates - Hepatitis panel negative. RUQ US  unremarkable  - suspect 2/2 CHF/hepatic congestion + chronic ETOH use    5.  ETOH Abuse - heavy drinker, at least 5 drinks per evening - may be contributing to CM, reduction of intake imperative  - No evidence of withdrawal   6  Hypomagnesemia - follow; goal >2    7 . DMII  - Hgb A1C 6.1 - On SSI.  8. Ischemic bowel Mesenteric ischemia - CXR with dilated bowels.  - CT scan demonstrated active extravasation from a posterior intercostal artery. Also demonstrated portal venous gas and pneumatosis of the small and large bowel.  - S/p exp/lap 12/11. Patchy ischemia of the distal ileum extending over about 75 cm without perforation. Ischemic segment resected.  Colon was distended with gas but viable. Small bowel left in discontinuity.  - Back to OR 12/15 for ischemic R colon and mesenteric ischemia. S/p exp/lap, wound vac exchange, R colectomy without anastomosis.  - Returned to OR 12/16 for ileostomy creation.   9. ID - Covering bowel pathogens with Zosyn .  - T max 100.4 F overnight (trending down), follow fever curve. Remains on vancomycin /Zosyn . - Micafungin  started on 12/16 then stopped 12/18. Had candida albicans in trach aspirate 12/14, no organisms in BAL 12/18.  - Blood cultures with no growth  10. AKI - AKI with shock.  - Resolved  11. FEN - TPN  12. Rhythm - SR today, has been in accelerated junctional rhythm as well.   13. VT - Quiescent - On amiodarone  gtt.   14. Pericardial effusion - There is a moderate effusion without tamponade, noted again by echo 12/16.  Will need to follow over time.  - Limited echo 12/17 with moderate effusion.  - This was not drained at time of VATS/right hemothorax washout on 12/18  15. Atrial flutter - Noted transiently.  Now in NSR on  amiodarone  gtt.  -  On heparin  gtt, will eventually be on warfarin for valves.   CRITICAL CARE Performed by: Ezra Shuck  Total critical care time: 45 minutes  Critical care time was exclusive of separately billable procedures and treating other patients.  Critical care was necessary to treat or prevent imminent or life-threatening deterioration.  Critical care was time spent personally by me on the following activities: development of treatment plan with patient and/or surrogate as well as nursing, discussions with consultants, evaluation of patient's response to treatment, examination of patient, obtaining history from patient or surrogate, ordering and performing treatments and interventions, ordering and review of laboratory studies, ordering and review of radiographic studies, pulse oximetry and re-evaluation of patient's condition.   Ezra Shuck 12/11/2024 7:45 AM

## 2024-12-11 NOTE — Progress Notes (Addendum)
 "  NAME:  HALTON NEAS, MRN:  [id number], DOB:  1981/06/14, LOS: 18 ADMISSION DATE:  11/22/2024, CONSULTATION DATE:  12/8 REFERRING MD:  Lucas, CHIEF COMPLAINT:  post cardiac surgery critical care services    History of Present Illness:  43 year old male patient with history of hypertension, alcohol and tobacco abuse, presented to the emergency room on 11/30 with approximately 1 month history of progressive dyspnea accompanied by lower extremity swelling extending up to the level of his scrotum and abdomen. Diagnostic evaluation by echocardiogram showed left ventricular ejection fraction 35 to 40% with grade 3 diastolic dysfunction this was further complicated by severe mitral valve regurgitation and aortic insufficiency because of this he was transferred to [facility] for further evaluation. He underwent cardiac catheterization on 12/4: Right heart hemodynamic parameters showed baseline right atrial pressure 5 mmHg, PA pressure 32/17, pulmonary capillary wedge pressure at 14 estimated Fick 4.9 L/min with cardiac index Fick calculated at 2.59 His PAPi was 3 Left heart cath was negative for coronary artery disease Went to OR 12/8 for MVR and AVR w/ impella insertion. PCCM asked to assist w/ post op care   OR course EBL: 1735 Received  Products: cryo 92ml, FFP 401 Also received DDAVP , 2200 crystalloid, 250ml albumin   Cell saver 1125 Pump time 4hrs Clamp time 2hrs 50 min  Events: multiple defibrillations intra-op  Intra-op ECHO EF estimated 40%  Pertinent  Medical History  Tobacco abuse, alcohol abuse, hypertension  Significant Hospital Events: Including procedures, antibiotic start and stop dates in addition to other pertinent events   11/30 admitted/. ECHO 35 to 40% with grade 3 diastolic dysfunction this was further complicated by severe mitral valve regurgitation and aortic insufficiency 12/4 left and right heart cath 12/8 AVR and MVR w/ bioprosthetic valves. Arrived on icu  w/ Impella 5.5 MCS at flow 4 lpm and P 7. Received 2 more PLTs, 2 FFP and 1 cryo in first 6 hrs post op for cont blood loss/oozing. Required NE, epi and milrinone . Good flow on IMPELLA but minimal pulsatility  at P7. Placed on Amio gtt for VT 12/9 received 1 unit PRBC over night for hgb down to 7.5, hgb 8 getting second unit of blood. Still on P7 3.5 lPM. Extubated. 12/11 1L thora, later hemorrhagic shock and hemothorax> IR embolization. Also found to have ischemic bowel and underwent ex-lap with partial resection of ileum. 12/15 plan for ostomy tomorrow, transition epi to levophed   12/16 plan back to OR for ostomy placement  12/17 hemodynamically stable off pressors on Milrinone  0.25 , unable to tolerate SBT, plan for CT chest and heparin  gtt, high fevers>micafungin   Interim History / Subjective:   No acute events overnight Remains on Impella at P7 with appropriate flow Sedation held this morning, following commands although deconditioned CVP 6, CI 3.2 PAP 40/22, SVR 849   sodium chloride  20 mL/hr at 12/11/24 0312   acetaminophen      amiodarone  30 mg/hr (12/11/24 0700)   dexmedetomidine  (PRECEDEX ) IV infusion 0.5 mcg/kg/hr (12/11/24 0700)   fentaNYL  infusion INTRAVENOUS 175 mcg/hr (12/11/24 0700)   heparin  Stopped (12/10/24 1812)   milrinone  0.25 mcg/kg/min (12/11/24 0700)   norepinephrine  (LEVOPHED ) Adult infusion 5 mcg/min (12/11/24 0700)   piperacillin -tazobactam 12.5 mL/hr at 12/11/24 0700   propofol  (DIPRIVAN ) infusion 20 mcg/kg/min (12/11/24 0700)   sodium bicarbonate  25 mEq (Impella PURGE) in dextrose  5 % 1000 mL bag     TPN ADULT (ION) 72 mL/hr at 12/10/24 1800   vancomycin  Stopped (12/10/24 2307)  Objective    Blood pressure 93/77, pulse 89, temperature 100 F (37.8 C), resp. rate (!) 24, height 5' 10 (1.778 m), weight 80 kg, SpO2 99%. PAP: (30-52)/(7-27) 51/20 CVP:  [5 mmHg-16 mmHg] 6 mmHg PCWP:  [15 mmHg-22 mmHg] 22 mmHg CO:  [6.2 L/min-8.1 L/min] 6.3 L/min CI:   [3.1 L/min/m2-4.3 L/min/m2] 3.2 L/min/m2  Vent Mode: PRVC FiO2 (%):  [40 %] 40 % Set Rate:  [22 bmp] 22 bmp Vt Set:  [580 mL] 580 mL PEEP:  [5 cmH20] 5 cmH20 Plateau Pressure:  [20 cmH20-24 cmH20] 22 cmH20   Intake/Output Summary (Last 24 hours) at 12/11/2024 0713 Last data filed at 12/11/2024 0700 Gross per 24 hour  Intake 4297.3 ml  Output 4665 ml  Net -367.7 ml   Filed Weights   12/09/24 0420 12/10/24 0500 12/11/24 0500  Weight: 81.5 kg 81.3 kg 80 kg   Awake on mechanical ventilation Synchronous with ventilator, coarse breath sounds, small amount of white secretions via ETT Ostomy pink and moist Fem with no edema/erythema drainage R Ax without hematoma  Resolved problem list   AKI Lactic acidosis, resolved Constipation Hyponatremia  Assessment and Plan   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 VT; frequent ectopy R pleural effusion> hemothorax post thora, now s/p VATS 12/18 Hypoxic respiratory failure Postop large pericardial effusion- without clear tamponade physiology and unable to drain Fevers, distributive shock- antifungal coverage added to vanc/zosyn  12/17 - Impella, amio and inotropes per AHF. Per HF, plan for Impella wean - Systemic anticoagulation with heparin : increasing to therapeutic dosing - Scheduled tylenol  - SBT today - Persistent fevers, although improving.  WBC unchanged. Infectious work up negative thus far.  D/c Mica, continue Vanc/Zosyn  for additional 48 hours if cx remain negative discontinue abx.   Ischemic bowel with pneumatosis; s/p partial ileum resection.  OR 12/14 for reexploration of the abdomen, found necrotic portions of the distal ileum and cecum along with the anterior lateral ascending colon. Underwent R colectomy without anastomosis 12/16 abdomen closed with right ostomy  placement -con't TPN & PICC; keeping lipids out of TPN until extubated -monitor ostomy output -Wound vac -Cortrak placement in anticipation of need for tube feeds in near future  Hyperglycemia; h/o pre-DM. A1c 6.1 -Controlled with SSI q 4 hours, goal BG 140-180 -OP follow up with PCP for prediabetes   H/o tobacco abuse H/o asthma vs COPD -Quitting smoking recommended; previously counseled -nebs while on vent -pulmonary hygiene   ETOH abuse -previously endorsed wanting to quit, address when appropriate    Deconditioned state - Globally weak, will benefit from PT/OT   DVT: systemic anticoagulation with valve replacement/aflutter Needs foley, lines, chest tube, impella FEN-TPN, per gen surg ok to start trickle feeds - pending cortrak placement    Critical care time:  35 minutes      CRITICAL CARE Performed by: Lucie KATHEE Irving  Critical care time was exclusive of separately billable procedures and treating other patients.  Critical care was necessary to treat or prevent imminent or life-threatening deterioration.  Critical care was time spent personally by me on the following activities: development of treatment plan with patient and/or surrogate as well as nursing, discussions with consultants, evaluation of patient's response to treatment, examination of patient, obtaining history from patient or surrogate, ordering and performing treatments and interventions, ordering and review of laboratory studies, ordering and review of radiographic studies,  pulse oximetry and re-evaluation of patient's condition.    "

## 2024-12-11 NOTE — Progress Notes (Signed)
 TCTS PM Rounding Progress Note  Extubated today Taking ice chips Tolerating trickle feeds Tachypneic but saturating well Impella down to P5, 2.2 L with co-ox 69 this afternoon  Vitals:   12/11/24 1700 12/11/24 1715  BP:    Pulse: (!) 106 100  Resp: (!) 28 (!) 36  Temp: 99.3 F (37.4 C) 99.5 F (37.5 C)  SpO2: 97% 99%    Plan: - Will add oxycodone  solution to help with pain control - Continue tube feeds as tolerated  Con Clunes, MD Cardiothoracic Surgery Pager: [phone]

## 2024-12-11 NOTE — Consult Note (Addendum)
 WOC Nurse wound follow up Refer to [facility] consult notes from yesterday, 12/18.  Abd Vac dressing and ostomy pouch was changed at that time.  WOC team will not be available on 12/25, so we will change the Vac dressing change schedule next week to Tues/Fri.   Thank-you,  Stephane Fought MSN, RN, CWOCN, CWCN-AP, CNS Contact Mon-Fri 0700-1500: [phone]

## 2024-12-11 NOTE — Progress Notes (Signed)
 1 Day Post-Op Procedures (LRB): VIDEO ASSISTED THORACOSCOPY (VATS)/DECORTICATION (Right) Subjective: Chest washed out last night, no pericardial window Hgb stable, hemodynamics stable Starting to have some ostomy output BAL no organisms, blood cultures remain negative  Objective: Vital signs in last 24 hours: Temp:  [98.2 F (36.8 C)-101.1 F (38.4 C)] 100 F (37.8 C) (12/19 0700) Pulse Rate:  [68-89] 89 (12/19 0700) Cardiac Rhythm: Normal sinus rhythm (12/19 0000) Resp:  [0-27] 24 (12/19 0700) BP: (86-102)/(67-80) 93/77 (12/18 1800) SpO2:  [94 %-100 %] 99 % (12/19 0700) Arterial Line BP: (95-126)/(56-76) 122/71 (12/19 0700) FiO2 (%):  [40 %] 40 % (12/19 0335) Weight:  [80 kg] 80 kg (12/19 0500)  Hemodynamic parameters for last 24 hours: PAP: (30-52)/(7-27) 51/20 CVP:  [5 mmHg-16 mmHg] 6 mmHg PCWP:  [15 mmHg-22 mmHg] 22 mmHg CO:  [6.2 L/min-8.1 L/min] 6.3 L/min CI:  [3.1 L/min/m2-4.3 L/min/m2] 3.2 L/min/m2  Intake/Output from previous day: 12/18 0701 - 12/19 0700 In: 4297.3 [I.V.:2516.3; IV Piggyback:1446.6] Out: 4665 [Urine:3635; Emesis/NG output:380; Stool:385; Chest Tube:265] Intake/Output this shift: Total I/O In: 12.4 [Other:12.4] Out: -   General appearance: intubated, sedated Heart: tachy, regular Lungs: rhonchi bilaterally; chest tube in place with small amount of dark blood Abdomen: Ostomy in place, small amount of succus Extremities: edema  Wound: sternal intact  Lab Results: Recent Labs    12/10/24 2118 12/11/24 0455 12/11/24 0458  WBC 18.7*  --  17.2*  HGB 9.5* 9.2* 9.5*  HCT 27.9* 27.0* 27.8*  PLT 158  --  178   BMET:  Recent Labs    12/10/24 2118 12/11/24 0455 12/11/24 0458  NA 145 145 145  K 3.8 4.1 4.2  CL 108  --  110  CO2 29  --  28  GLUCOSE 135*  --  123*  BUN 35*  --  32*  CREATININE 0.81  --  0.74  CALCIUM  8.7*  --  8.6*    PT/INR:  No results for input(s): LABPROT, INR in the last 72 hours.  ABG    Component Value  Date/Time   PHART 7.407 12/11/2024 0455   HCO3 28.2 (H) 12/11/2024 0455   TCO2 30 12/11/2024 0455   ACIDBASEDEF 1.0 12/06/2024 1432   O2SAT 72.7 12/11/2024 0458   CBG (last 3)  Recent Labs    12/10/24 2325 12/11/24 0304 12/11/24 0749  GLUCAP 114* 119* 110*    Assessment/Plan: S/P Procedures (LRB): VIDEO ASSISTED THORACOSCOPY (VATS)/DECORTICATION (Right) 12/8 AVR/MVR impella 12/16 Abdominal closure and end ileostomy 12/18 R VATS chest washout  NEURO- sedated but neuro intact on awakening trials, try to wean off sedation this morning CV- in sinus tachy  Impella p7, 4.0 L/min  Weaning Impella per HF team  Known moderate pericardial effusion - Will continue to monitor and try to wean off support without drainage. If necessary, will proceed with left VATS drainage RESP- CXR stable  VDRF- vent per CCM  Try to work towards extubation today  Follow-up BAL from 12/18 - NGTD RENAL- cre and lytes stable, continue diuresis per HF team ENDO- CBG well controlled GI - Abdomen closed and end ileostomy on 12/16  On TPN  Plan for cortrak today and try trickle feeds, ok per gen surg ID- Fevers improving, continue vanc/zosyn , added mica on 12/17, will stop mica today - blood cultures sent on 12/17 - NGTD Remains critically ill  Dispo: ICU   LOS: 18 days    Con RAMAN Domonik Levario 12/11/2024

## 2024-12-11 NOTE — Plan of Care (Signed)
 Problem: Education: Goal: Knowledge of General Education information will improve Description: Including pain rating scale, medication(s)/side effects and non-pharmacologic comfort measures Outcome: Progressing   Problem: Health Behavior/Discharge Planning: Goal: Ability to manage health-related needs will improve Outcome: Progressing   Problem: Clinical Measurements: Goal: Ability to maintain clinical measurements within normal limits will improve Outcome: Progressing Goal: Will remain free from infection Outcome: Progressing Goal: Diagnostic test results will improve Outcome: Progressing Goal: Respiratory complications will improve Outcome: Progressing Goal: Cardiovascular complication will be avoided Outcome: Progressing   Problem: Activity: Goal: Risk for activity intolerance will decrease Outcome: Progressing   Problem: Nutrition: Goal: Adequate nutrition will be maintained Outcome: Progressing   Problem: Coping: Goal: Level of anxiety will decrease Outcome: Progressing   Problem: Elimination: Goal: Will not experience complications related to bowel motility Outcome: Progressing Goal: Will not experience complications related to urinary retention Outcome: Progressing   Problem: Pain Managment: Goal: General experience of comfort will improve and/or be controlled Outcome: Progressing   Problem: Safety: Goal: Ability to remain free from injury will improve Outcome: Progressing   Problem: Skin Integrity: Goal: Risk for impaired skin integrity will decrease Outcome: Progressing   Problem: Education: Goal: Ability to demonstrate management of disease process will improve Outcome: Progressing Goal: Ability to verbalize understanding of medication therapies will improve Outcome: Progressing Goal: Individualized Educational Video(s) Outcome: Progressing   Problem: Activity: Goal: Capacity to carry out activities will improve Outcome: Progressing    Problem: Cardiac: Goal: Ability to achieve and maintain adequate cardiopulmonary perfusion will improve Outcome: Progressing   Problem: Education: Goal: Understanding of CV disease, CV risk reduction, and recovery process will improve Outcome: Progressing Goal: Individualized Educational Video(s) Outcome: Progressing   Problem: Activity: Goal: Ability to return to baseline activity level will improve Outcome: Progressing   Problem: Cardiovascular: Goal: Ability to achieve and maintain adequate cardiovascular perfusion will improve Outcome: Progressing Goal: Vascular access site(s) Level 0-1 will be maintained Outcome: Progressing   Problem: Health Behavior/Discharge Planning: Goal: Ability to safely manage health-related needs after discharge will improve Outcome: Progressing   Problem: Education: Goal: Understanding of CV disease, CV risk reduction, and recovery process will improve Outcome: Progressing Goal: Individualized Educational Video(s) Outcome: Progressing   Problem: Activity: Goal: Ability to return to baseline activity level will improve Outcome: Progressing   Problem: Cardiovascular: Goal: Ability to achieve and maintain adequate cardiovascular perfusion will improve Outcome: Progressing Goal: Vascular access site(s) Level 0-1 will be maintained Outcome: Progressing   Problem: Health Behavior/Discharge Planning: Goal: Ability to safely manage health-related needs after discharge will improve Outcome: Progressing   Problem: Education: Goal: Will demonstrate proper wound care and an understanding of methods to prevent future damage Outcome: Progressing Goal: Knowledge of disease or condition will improve Outcome: Progressing Goal: Knowledge of the prescribed therapeutic regimen will improve Outcome: Progressing Goal: Individualized Educational Video(s) Outcome: Progressing   Problem: Activity: Goal: Risk for activity intolerance will  decrease Outcome: Progressing   Problem: Cardiac: Goal: Will achieve and/or maintain hemodynamic stability Outcome: Progressing   Problem: Clinical Measurements: Goal: Postoperative complications will be avoided or minimized Outcome: Progressing   Problem: Respiratory: Goal: Respiratory status will improve Outcome: Progressing   Problem: Skin Integrity: Goal: Wound healing without signs and symptoms of infection Outcome: Progressing Goal: Risk for impaired skin integrity will decrease Outcome: Progressing   Problem: Urinary Elimination: Goal: Ability to achieve and maintain adequate renal perfusion and functioning will improve Outcome: Progressing   Problem: Cardiac: Goal: Ability to achieve and maintain adequate cardiopulmonary perfusion  will improve Outcome: Progressing Goal: Vascular access site(s) Level 0-1 will be maintained Outcome: Progressing   Problem: Fluid Volume: Goal: Ability to achieve a balanced intake and output will improve Outcome: Progressing

## 2024-12-11 NOTE — Procedures (Signed)
 Extubation Procedure Note  Patient Details:   Name: Alejandro Lopez DOB: December 04, 1981 MRN: [id number]   Airway Documentation:    Vent end date: 12/11/24 Vent end time: 1041   Evaluation  O2 sats: stable throughout Complications: No apparent complications Patient did tolerate procedure well. Bilateral Breath Sounds: Rhonchi, Coarse crackles   Yes  Pt extubated per order to 4L [place]. Pt had a positive cuff leak, able to state name, good cough, and no stridor noted.   [name] 12/11/2024, 10:41 AM

## 2024-12-11 NOTE — Progress Notes (Signed)
 12/11/2024 PM rounds Extubated, a little tachypneic and oral secretions RT working with him Can try BIPAP tonight if secretions improve Precedex  PRN to facilitate compliance is okay Weaning down to P5 per AHF, SvO2 looks okay  Rolan Sharps MD PCCM

## 2024-12-11 NOTE — Progress Notes (Signed)
 PHARMACY - TOTAL PARENTERAL NUTRITION CONSULT NOTE   Indication: massive bowel resection   Patient Measurements: Height: 5' 10 (177.8 cm) Weight: 80 kg (176 lb 5.9 oz) IBW/kg (Calculated) : 73 TPN AdjBW (KG): 69.9 Body mass index is 25.31 kg/m. Usual Weight: weight 79kg on admission, unclear baseline.   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 Patient underwent an aortic and mitral valve replacement on 12/8 with insertion of Impella. Post-op was found to have continuing HgB drop with noted abdominal pain. Was found to have hemorrhage from intercostal, chest tube and embolization was completed. Workup for dropping HgB included CT angio of chest/ab/and pelvis showed pneumatosis and concern for bowel ischemia. Patient went to OR on 12/12 for ex-lap found to have ischemia of distal ileum over 75cm without perforation. Patient was left in discontinuity with plans to return to OR.  Patient with diet throughout admission however noted nausea and abdominal pain during peri-op time period. Pharmacy consulted to manage TPN.   Glucose / Insulin : no hx DM, A1c 6.1% - CBGs < 180 Used 2 units sSSI in the past 24 hrs Electrolytes: Na/CL 146/109, CoCa 10.4 (iCa 1.21, received 2g IV yesterday), Phos 3.7 (12/18), K up 4.0 (received 60 meq yesterday and 30 mEq this AM outside of TPN), mg up 2.7 (received 4g IV this morning outside of TPN) Renal: SCr 0.74 (BL SCr ~ 0.7), BUN 32.  12/15 Lasix  60 mg IV x 2  12/16-12/19 Lasix  80 mg IV x BID Hepatic: AST/ALT mildly elevated, tbili down 3.2 (no jaundice), alk phos wnl, TG 212, albumin  1.9 *elevation not due to TPN Intake / Output; MIVF: UOP 2.6 ml/kg/hr, NG up , chest tube down to 136 mL, LBM 12/17 (175 mL), net -11.2L GI Imaging: 12/11 CT: pneumatosis of small bowel with gas, concerning for bowel ischemia  GI Surgeries / Procedures:  12/11  ex-lap with distal ileum with ischemia (75cm), left in discontinuity with plans to return to OR on Sunday to evaluate for further ischemia   12/14 ex lap, placement of wound vac, right colectomy due to necrosis, abdomen left open  12/16 small bowel resection, end ileostomy, closure   Central access: PICC placed 12/04/24 TPN start date: 12/04/24  Nutritional Goals:  Goal concentrated TPN rate with lipids is 72 mL/hr (76g/L AA, 38g/L ILE, and 16.5% CHO will provide 131g AA, 285g CHO, 64g ILE and 2134 kcals per day)  RD Estimated Needs Total Energy Estimated Needs: 2200-2400kcals Total Protein Estimated Needs: 125-150 g Total Fluid Estimated Needs: 1.8 L  Current Nutrition:  TPN  Initiating trickle tube feeds this afternoon   Plan:  Concentrate TPN as fluid resuscitation is completed receiving diuresis  Continue TPN with lipids at goal of 72 ml/hr.  Electrolytes in TPN: Na to 0 mEq/L, K 54mEq/L, Ca 23mEq/L, incr Mg 15mEq/L, Phos 17mmol/L, max CL (ratio is 1:2.27, required for TPN compatibility) Add standard MVI and trace elements to TPN Continue sSSI Q4H  Monitor TPN labs on Mon/Thurs - labs in AM Follow tube feed toleration   KCL x 3 runs IV at 1600 to account for Lasix    Vermell Mccallum, PharmD

## 2024-12-11 NOTE — Progress Notes (Signed)
 Nutrition Follow-up  DOCUMENTATION CODES:   Not applicable  INTERVENTION:   TPN to meet nutritional needs; recommend continuing TPN at goal rate until pt demonstrating tolerance of trickle TF with ability to advance to goal.   Tube Feeding via Cortrak: trickle TF only. Close monitoring given pt remains on low dose levophed  in setting of GI surgery from ischemic gut Vital 1.5 at 20 ml/hr (no advanacement)  Goal TF Recommendations: Vital 1.5 at 60 ml/hr with Pro-Source TF20 60 mL BID  TF at goal provides 2320 kcals, 137 g of protein and 1094 mL of free water   Recommend small amount of free water  flushes as well   NUTRITION DIAGNOSIS:   Inadequate oral intake related to acute illness, altered GI function as evidenced by NPO status.  Being addressed via nutrition support  GOAL:   Patient will meet greater than or equal to 90% of their needs  Progressing  MONITOR:   Vent status, TF tolerance, Labs, Skin, I & O's, Weight trends (TPN)  REASON FOR ASSESSMENT:   Consult, Ventilator New TPN/TNA, Assessment of nutrition requirement/status  ASSESSMENT:   43 yo male admitted with acute systolic heart failure with severe MR and AI. Pt to OR on 12/08 for AVR, MVR and Impella 5.5 placement and extubated 12/09. Pt with thoracentesis on 12/11 followed by IR coil embolization for bleeding intercostal artery with R hemothorax, pt also developed ischemic bowel requiring bowel resection. PMH includes HTN, tobacco and Etoh abuse (reports drinking at least 5 drinks per night)  11/30 Admitted, Echo EF 35-40% (grade 3 diastolic dysfunction), severe MR, aortic insufficiency 12/08 OR: AVR, MVR, Impella 5.5 with Dr. Lucas 12/09 Extubated 12/11 Thoracentesis with 1L bloody output, post procedure anemia, +R Hemithorax, IR for coil embolization of intercostal artery secondary to active bleeding, OR for ischemic bowel (patchy ischemia distal ileum ~75 cm) without perforation requiring resection, bowel  left in discontinuity, abdomen open with ABThera in place 12/12 TPN initiated 12/15 OR: ischemic R colon requiring R colectomy without anastomosis 12/16 OR: SB resection, abd closed, end ileostomy creation 12/18 OR for VATS/hemothorax washout, pericardial window not performed  Pt alert, oriented, following commands on vent support this AM. Per RN, possible extubation today.  Propofol  OFF  Impella 5.5 at P7  Plan for Cortrak today with initiation of trickle TF today per Dr. Claudene and Surgery  TPN at at 72 ml/hr  UOP 3.6 L in 24 hours  NG with 380 mL, FMS with 350 mL stool, small amount of stool mixed with bloody output from ileostomy Midline abdominal wound VAC in place  R Pleural Chest tube with 220 mL overnight  Na 145, hanging around 144-147, BUN slightly elevated, Creatinine wdl. Recommend initiation of some small amounts of free water  flush per tube once Cortrak placed  Labs: Sodium 145 (wdl) BUN 32 Creatinine wdl Potassium 4.2 (wdl) Phosphorus 3.7 (wdl) CBGs 110-137  Meds: Lasix  IV Folic Acid  IV thiamine  SS novolog  KCl  Diet Order:   Diet Order             Diet NPO time specified  Diet effective now                   EDUCATION NEEDS:   Not appropriate for education at this time  Skin:  Skin Assessment: Skin Integrity Issues: Skin Integrity Issues:: Other (Comment), DTI DTI: buttocks Wound Vac: open abdomen-ABThera Incisions: chest x 2 Other: new ileostomy  Last BM:  12/19 small amount of stool and blood via  ileostomy, +350 mL stool per rectum via FMS  Height:   Ht Readings from Last 1 Encounters:  12/04/24 5' 10 (1.778 m)    Weight:   Wt Readings from Last 1 Encounters:  12/11/24 80 kg    BMI:  Body mass index is 25.31 kg/m.  Estimated Nutritional Needs:   Kcal:  2200-2400kcals  Protein:  125-150 g  Fluid:  1.8 L  Betsey Finger MS, RDN, LDN, CNSC Registered Dietitian 3 Clinical Nutrition RD Inpatient Contact Info in  Amion

## 2024-12-11 NOTE — Progress Notes (Signed)
 Patient did not tolerate BIPAP, patient placed on HHFNC 30L/50% per MD order.

## 2024-12-12 ENCOUNTER — Inpatient Hospital Stay (HOSPITAL_COMMUNITY)

## 2024-12-12 DIAGNOSIS — I5021 Acute systolic (congestive) heart failure: Secondary | ICD-10-CM | POA: Diagnosis not present

## 2024-12-12 LAB — COMPREHENSIVE METABOLIC PANEL WITH GFR
ALT: 122 U/L — ABNORMAL HIGH (ref 0–44)
ALT: 150 U/L — ABNORMAL HIGH (ref 0–44)
AST: 191 U/L — ABNORMAL HIGH (ref 15–41)
AST: 205 U/L — ABNORMAL HIGH (ref 15–41)
Albumin: 2.2 g/dL — ABNORMAL LOW (ref 3.5–5.0)
Albumin: 2.4 g/dL — ABNORMAL LOW (ref 3.5–5.0)
Alkaline Phosphatase: 104 U/L (ref 38–126)
Alkaline Phosphatase: 115 U/L (ref 38–126)
Anion gap: 8 (ref 5–15)
Anion gap: 10 (ref 5–15)
BUN: 29 mg/dL — ABNORMAL HIGH (ref 6–20)
BUN: 27 mg/dL — ABNORMAL HIGH (ref 6–20)
CO2: 29 mmol/L (ref 22–32)
CO2: 27 mmol/L (ref 22–32)
Calcium: 9.2 mg/dL (ref 8.9–10.3)
Calcium: 9.4 mg/dL (ref 8.9–10.3)
Chloride: 109 mmol/L (ref 98–111)
Chloride: 108 mmol/L (ref 98–111)
Creatinine, Ser: 0.6 mg/dL — ABNORMAL LOW (ref 0.61–1.24)
Creatinine, Ser: 0.5 mg/dL — ABNORMAL LOW (ref 0.61–1.24)
GFR, Estimated: >60 mL/min
GFR, Estimated: >60 mL/min
Glucose, Bld: 134 mg/dL — ABNORMAL HIGH (ref 70–99)
Glucose, Bld: 134 mg/dL — ABNORMAL HIGH (ref 70–99)
Potassium: 3.9 mmol/L (ref 3.5–5.1)
Potassium: 4.3 mmol/L (ref 3.5–5.1)
Sodium: 146 mmol/L — ABNORMAL HIGH (ref 135–145)
Sodium: 145 mmol/L (ref 135–145)
Total Bilirubin: 3 mg/dL — ABNORMAL HIGH (ref 0.0–1.2)
Total Bilirubin: 2.7 mg/dL — ABNORMAL HIGH (ref 0.0–1.2)
Total Protein: 6.1 g/dL — ABNORMAL LOW (ref 6.5–8.1)
Total Protein: 6.6 g/dL (ref 6.5–8.1)

## 2024-12-12 LAB — CBC
HCT: 30.5 % — ABNORMAL LOW (ref 39.0–52.0)
HCT: 33.1 % — ABNORMAL LOW (ref 39.0–52.0)
Hemoglobin: 10.4 g/dL — ABNORMAL LOW (ref 13.0–17.0)
Hemoglobin: 10.9 g/dL — ABNORMAL LOW (ref 13.0–17.0)
MCH: 30.4 pg (ref 26.0–34.0)
MCH: 29.9 pg (ref 26.0–34.0)
MCHC: 34.1 g/dL (ref 30.0–36.0)
MCHC: 32.9 g/dL (ref 30.0–36.0)
MCV: 89.2 fL (ref 80.0–100.0)
MCV: 90.7 fL (ref 80.0–100.0)
Platelets: 251 K/uL (ref 150–400)
Platelets: 260 K/uL (ref 150–400)
RBC: 3.42 MIL/uL — ABNORMAL LOW (ref 4.22–5.81)
RBC: 3.65 MIL/uL — ABNORMAL LOW (ref 4.22–5.81)
RDW: 18.8 % — ABNORMAL HIGH (ref 11.5–15.5)
RDW: 18.7 % — ABNORMAL HIGH (ref 11.5–15.5)
WBC: 20.6 K/uL — ABNORMAL HIGH (ref 4.0–10.5)
WBC: 21 K/uL — ABNORMAL HIGH (ref 4.0–10.5)
nRBC: 0.6 % — ABNORMAL HIGH (ref 0.0–0.2)
nRBC: 0.6 % — ABNORMAL HIGH (ref 0.0–0.2)

## 2024-12-12 LAB — POCT I-STAT 7, (LYTES, BLD GAS, ICA,H+H)
Acid-Base Excess: 5 mmol/L — ABNORMAL HIGH (ref 0.0–2.0)
Acid-Base Excess: 5 mmol/L — ABNORMAL HIGH (ref 0.0–2.0)
Bicarbonate: 29.7 mmol/L — ABNORMAL HIGH (ref 20.0–28.0)
Bicarbonate: 29.2 mmol/L — ABNORMAL HIGH (ref 20.0–28.0)
Calcium, Ion: 1.22 mmol/L (ref 1.15–1.40)
Calcium, Ion: 1.23 mmol/L (ref 1.15–1.40)
HCT: 31 % — ABNORMAL LOW (ref 39.0–52.0)
HCT: 32 % — ABNORMAL LOW (ref 39.0–52.0)
Hemoglobin: 10.5 g/dL — ABNORMAL LOW (ref 13.0–17.0)
Hemoglobin: 10.9 g/dL — ABNORMAL LOW (ref 13.0–17.0)
O2 Saturation: 99 %
O2 Saturation: 99 %
Patient temperature: 37.1
Patient temperature: 37.4
Potassium: 3.8 mmol/L (ref 3.5–5.1)
Potassium: 4.1 mmol/L (ref 3.5–5.1)
Sodium: 147 mmol/L — ABNORMAL HIGH (ref 135–145)
Sodium: 144 mmol/L (ref 135–145)
TCO2: 31 mmol/L (ref 22–32)
TCO2: 30 mmol/L (ref 22–32)
pCO2 arterial: 44.1 mmHg (ref 32–48)
pCO2 arterial: 43.2 mmHg (ref 32–48)
pH, Arterial: 7.437 (ref 7.35–7.45)
pH, Arterial: 7.439 (ref 7.35–7.45)
pO2, Arterial: 115 mmHg — ABNORMAL HIGH (ref 83–108)
pO2, Arterial: 124 mmHg — ABNORMAL HIGH (ref 83–108)

## 2024-12-12 LAB — HEPARIN LEVEL (UNFRACTIONATED)
Heparin Unfractionated: 0.14 [IU]/mL — ABNORMAL LOW (ref 0.30–0.70)
Heparin Unfractionated: 0.2 [IU]/mL — ABNORMAL LOW (ref 0.30–0.70)
Heparin Unfractionated: 0.23 [IU]/mL — ABNORMAL LOW (ref 0.30–0.70)

## 2024-12-12 LAB — MAGNESIUM: Magnesium: 1.9 mg/dL (ref 1.7–2.4)

## 2024-12-12 LAB — COOXEMETRY PANEL
Carboxyhemoglobin: 2 % — ABNORMAL HIGH (ref 0.5–1.5)
Carboxyhemoglobin: 2.1 % — ABNORMAL HIGH (ref 0.5–1.5)
Methemoglobin: <0.7 % (ref 0.0–1.5)
Methemoglobin: <0.7 % (ref 0.0–1.5)
O2 Saturation: 71.6 %
O2 Saturation: 65.7 %
Total hemoglobin: 11.1 g/dL — ABNORMAL LOW (ref 12.0–16.0)
Total hemoglobin: 11.2 g/dL — ABNORMAL LOW (ref 12.0–16.0)

## 2024-12-12 LAB — PHOSPHORUS: Phosphorus: 3.6 mg/dL (ref 2.5–4.6)

## 2024-12-12 LAB — GLUCOSE, CAPILLARY
Glucose-Capillary: 123 mg/dL — ABNORMAL HIGH (ref 70–99)
Glucose-Capillary: 134 mg/dL — ABNORMAL HIGH (ref 70–99)
Glucose-Capillary: 144 mg/dL — ABNORMAL HIGH (ref 70–99)
Glucose-Capillary: 136 mg/dL — ABNORMAL HIGH (ref 70–99)
Glucose-Capillary: 130 mg/dL — ABNORMAL HIGH (ref 70–99)
Glucose-Capillary: 135 mg/dL — ABNORMAL HIGH (ref 70–99)
Glucose-Capillary: 146 mg/dL — ABNORMAL HIGH (ref 70–99)

## 2024-12-12 LAB — LACTATE DEHYDROGENASE: LDH: 922 U/L — ABNORMAL HIGH (ref 105–235)

## 2024-12-12 LAB — CG4 I-STAT (LACTIC ACID): Lactic Acid, Venous: 0.9 mmol/L (ref 0.5–1.9)

## 2024-12-12 MED ORDER — THIAMINE MONONITRATE 100 MG PO TABS
100.0000 mg | ORAL_TABLET | Freq: Every day | ORAL | Status: DC
  Administered 2024-12-12 – 2024-12-22 (×11): 100 mg
  Filled 2024-12-12 (×9): qty 1

## 2024-12-12 MED ORDER — DEXMEDETOMIDINE HCL IN NACL 400 MCG/100ML IV SOLN
0.0000 ug/kg/h | INTRAVENOUS | Status: DC
  Administered 2024-12-12: 0.7 ug/kg/h via INTRAVENOUS
  Administered 2024-12-12: 0.4 ug/kg/h via INTRAVENOUS
  Administered 2024-12-13: 1 ug/kg/h via INTRAVENOUS
  Administered 2024-12-13: 0.7 ug/kg/h via INTRAVENOUS
  Administered 2024-12-13: 1.2 ug/kg/h via INTRAVENOUS
  Administered 2024-12-13: 1 ug/kg/h via INTRAVENOUS
  Administered 2024-12-14 (×3): 1.2 ug/kg/h via INTRAVENOUS
  Administered 2024-12-14: 1 ug/kg/h via INTRAVENOUS
  Administered 2024-12-15: 0.9 ug/kg/h via INTRAVENOUS
  Administered 2024-12-15: 1.2 ug/kg/h via INTRAVENOUS
  Administered 2024-12-15 (×2): 1 ug/kg/h via INTRAVENOUS
  Administered 2024-12-15: 1.1 ug/kg/h via INTRAVENOUS
  Administered 2024-12-16 (×2): 0.9 ug/kg/h via INTRAVENOUS
  Filled 2024-12-12 (×3): qty 100
  Filled 2024-12-12 (×2): qty 200
  Filled 2024-12-12 (×8): qty 100
  Filled 2024-12-12: qty 200
  Filled 2024-12-12: qty 100

## 2024-12-12 MED ORDER — FOLIC ACID 1 MG PO TABS
1.0000 mg | ORAL_TABLET | Freq: Every day | ORAL | Status: DC
  Administered 2024-12-12 – 2024-12-22 (×11): 1 mg
  Filled 2024-12-12 (×9): qty 1

## 2024-12-12 MED ORDER — QUETIAPINE FUMARATE 25 MG PO TABS
25.0000 mg | ORAL_TABLET | Freq: Every day | ORAL | Status: DC
  Administered 2024-12-12 – 2024-12-27 (×15): 25 mg
  Filled 2024-12-12 (×8): qty 1

## 2024-12-12 MED ORDER — SODIUM CHLORIDE 3 % IN NEBU
4.0000 mL | INHALATION_SOLUTION | Freq: Two times a day (BID) | RESPIRATORY_TRACT | Status: AC
  Administered 2024-12-12 – 2024-12-14 (×6): 4 mL via RESPIRATORY_TRACT
  Filled 2024-12-12 (×6): qty 4

## 2024-12-12 MED ORDER — PHENOBARBITAL SODIUM 130 MG/ML IJ SOLN
97.5000 mg | Freq: Three times a day (TID) | INTRAMUSCULAR | Status: AC
  Administered 2024-12-12 – 2024-12-14 (×6): 97.5 mg via INTRAVENOUS
  Filled 2024-12-12 (×6): qty 1

## 2024-12-12 MED ORDER — TRACE MINERALS CU-MN-SE-ZN 300-55-60-3000 MCG/ML IV SOLN
INTRAVENOUS | Status: AC
  Filled 2024-12-12: qty 875.53

## 2024-12-12 MED ORDER — ACETAMINOPHEN 10 MG/ML IV SOLN
1000.0000 mg | Freq: Three times a day (TID) | INTRAVENOUS | Status: AC
  Administered 2024-12-12 – 2024-12-13 (×3): 1000 mg via INTRAVENOUS
  Filled 2024-12-12 (×3): qty 100

## 2024-12-12 MED ORDER — POTASSIUM CHLORIDE 10 MEQ/100ML IV SOLN
10.0000 meq | INTRAVENOUS | Status: AC
  Administered 2024-12-12 (×4): 10 meq via INTRAVENOUS
  Filled 2024-12-12 (×4): qty 100

## 2024-12-12 MED ORDER — PHENOBARBITAL SODIUM 65 MG/ML IJ SOLN
32.5000 mg | Freq: Three times a day (TID) | INTRAMUSCULAR | Status: AC
  Administered 2024-12-16 – 2024-12-18 (×6): 32.5 mg via INTRAVENOUS
  Filled 2024-12-12 (×7): qty 1

## 2024-12-12 MED ORDER — MAGNESIUM SULFATE 4 GM/100ML IV SOLN
4.0000 g | Freq: Once | INTRAVENOUS | Status: AC
  Administered 2024-12-12: 4 g via INTRAVENOUS
  Filled 2024-12-12: qty 100

## 2024-12-12 MED ORDER — PHENOBARBITAL SODIUM 65 MG/ML IJ SOLN
65.0000 mg | Freq: Three times a day (TID) | INTRAMUSCULAR | Status: AC
  Administered 2024-12-14 – 2024-12-16 (×6): 65 mg via INTRAVENOUS
  Filled 2024-12-12 (×6): qty 1

## 2024-12-12 MED ORDER — SODIUM CHLORIDE 0.9 % IV SOLN
1.0000 g | Freq: Three times a day (TID) | INTRAVENOUS | Status: DC
  Administered 2024-12-12 – 2024-12-15 (×9): 1 g via INTRAVENOUS
  Filled 2024-12-12 (×9): qty 20

## 2024-12-12 MED ORDER — LORAZEPAM 2 MG/ML IJ SOLN
1.0000 mg | INTRAMUSCULAR | Status: DC | PRN
  Administered 2024-12-12 – 2024-12-20 (×11): 1 mg via INTRAVENOUS
  Filled 2024-12-12 (×11): qty 1

## 2024-12-12 MED ORDER — ORAL CARE MOUTH RINSE: 15.0000 mL | OROMUCOSAL | Status: DC | PRN

## 2024-12-12 MED ORDER — MAGNESIUM SULFATE 2 GM/50ML IV SOLN
2.0000 g | Freq: Once | INTRAVENOUS | Status: AC
  Administered 2024-12-12: 2 g via INTRAVENOUS
  Filled 2024-12-12: qty 50

## 2024-12-12 MED ORDER — SODIUM CHLORIDE 0.9 % IV SOLN: 260.0000 mg | Freq: Once | INTRAVENOUS | Status: DC

## 2024-12-12 NOTE — Progress Notes (Signed)
 "  NAME:  Alejandro Lopez, MRN:  [id number], DOB:  01/21/1981, LOS: 19 ADMISSION DATE:  11/22/2024, CONSULTATION DATE:  12/8 REFERRING MD:  Lucas, CHIEF COMPLAINT:  post cardiac surgery critical care services    History of Present Illness:  43 year old male patient with history of hypertension, alcohol and tobacco abuse, presented to the emergency room on 11/30 with approximately 1 month history of progressive dyspnea accompanied by lower extremity swelling extending up to the level of his scrotum and abdomen. Diagnostic evaluation by echocardiogram showed left ventricular ejection fraction 35 to 40% with grade 3 diastolic dysfunction this was further complicated by severe mitral valve regurgitation and aortic insufficiency because of this he was transferred to [facility] for further evaluation. He underwent cardiac catheterization on 12/4: Right heart hemodynamic parameters showed baseline right atrial pressure 5 mmHg, PA pressure 32/17, pulmonary capillary wedge pressure at 14 estimated Fick 4.9 L/min with cardiac index Fick calculated at 2.59 His PAPi was 3 Left heart cath was negative for coronary artery disease Went to OR 12/8 for MVR and AVR w/ impella insertion. PCCM asked to assist w/ post op care   OR course EBL: 1735 Received  Products: cryo 92ml, FFP 401 Also received DDAVP , 2200 crystalloid, 250ml albumin   Cell saver 1125 Pump time 4hrs Clamp time 2hrs 50 min  Events: multiple defibrillations intra-op  Intra-op ECHO EF estimated 40%  Pertinent  Medical History  Tobacco abuse, alcohol abuse, hypertension  Significant Hospital Events: Including procedures, antibiotic start and stop dates in addition to other pertinent events   11/30 admitted/. ECHO 35 to 40% with grade 3 diastolic dysfunction this was further complicated by severe mitral valve regurgitation and aortic insufficiency 12/4 left and right heart cath 12/8 AVR and MVR w/ bioprosthetic valves. Arrived on icu  w/ Impella 5.5 MCS at flow 4 lpm and P 7. Received 2 more PLTs, 2 FFP and 1 cryo in first 6 hrs post op for cont blood loss/oozing. Required NE, epi and milrinone . Good flow on IMPELLA but minimal pulsatility  at P7. Placed on Amio gtt for VT 12/9 received 1 unit PRBC over night for hgb down to 7.5, hgb 8 getting second unit of blood. Still on P7 3.5 lPM. Extubated. 12/11 1L thora, later hemorrhagic shock and hemothorax> IR embolization. Also found to have ischemic bowel and underwent ex-lap with partial resection of ileum. 12/15 plan for ostomy tomorrow, transition epi to levophed   12/16 plan back to OR for ostomy placement  12/17 hemodynamically stable off pressors on Milrinone  0.25 , unable to tolerate SBT, plan for CT chest and heparin  gtt, high fevers>micafungin  12/19 extubated  Interim History / Subjective:   No acute events overnight Remains on Impella at P7 with appropriate flow Sedation held this morning, following commands although deconditioned CVP 6, CI 3.2 PAP 40/22, SVR 849   sodium chloride  20 mL/hr at 12/11/24 0312   amiodarone  30 mg/hr (12/12/24 0600)   feeding supplement (VITAL 1.5 CAL) 20 mL/hr at 12/12/24 0600   heparin  1,050 Units/hr (12/12/24 0600)   milrinone  0.25 mcg/kg/min (12/12/24 0600)   norepinephrine  (LEVOPHED ) Adult infusion Stopped (12/11/24 1143)   piperacillin -tazobactam 3.375 g (12/12/24 0611)   sodium bicarbonate  25 mEq (Impella PURGE) in dextrose  5 % 1000 mL bag     TPN ADULT (ION) 72 mL/hr at 12/12/24 0600   vancomycin  Stopped (12/11/24 2356)    Objective    Blood pressure 93/77, pulse (!) 108, temperature 99.1 F (37.3 C), resp. rate (!) 26,  height 5' 10 (1.778 m), weight 76.2 kg, SpO2 97%. PAP: (42-62)/(15-31) 53/19 CVP:  [3 mmHg-14 mmHg] 5 mmHg PCWP:  [17 mmHg-19 mmHg] 19 mmHg CO:  [6.1 L/min-9.2 L/min] 7.2 L/min CI:  [3.07 L/min/m2-4.6 L/min/m2] 3.6 L/min/m2  Vent Mode: CPAP;PSV FiO2 (%):  [40 %-50 %] 50 % PEEP:  [5 cmH20] 5  cmH20 Pressure Support:  [5 cmH20-10 cmH20] 5 cmH20   Intake/Output Summary (Last 24 hours) at 12/12/2024 0640 Last data filed at 12/12/2024 0600 Gross per 24 hour  Intake 4974.09 ml  Output 6140 ml  Net -1165.91 ml   Filed Weights   12/10/24 0500 12/11/24 0500 12/12/24 0500  Weight: 81.3 kg 80 kg 76.2 kg   Anxious Shallow rapid respirations otherwise minimal crackles Ext warm Ostomy putting out brown stool Ext warm Moves to command but weak Minimal chest tube output  Patient Lines/Drains/Airways Status     Active Line/Drains/Airways     Name Placement date Placement time Site Days   Arterial Line 12/06/24 Right Radial 12/06/24  --  Radial  6   Peripheral IV 11/26/24 22 G 1.75 Anterior;Right Forearm 11/26/24  1757  Forearm  16   PICC Triple Lumen 12/04/24 Right Brachial 40 cm 0 cm 12/04/24  1623  -- 8   Impella 11/30/24  1409  -- 12   Chest Tube 1 Right Pleural 28 Fr. 12/10/24  1951  Pleural  2   Ileostomy Standard (end) RLQ 12/08/24  1352  RLQ  4   Urethral Catheter Jeronimo Cruz Reyes RN Temperature probe 12/03/24  2345  Temperature probe  9   Pulmonary Artery Catheter 11/30/24 Left 11/30/24  1052  -- 12   Small Bore Feeding Tube 10 Fr. Left nare Marking at nare/corner of mouth 65 cm 12/11/24  1007  Left nare  1   Wound 11/30/24 1512 Surgical Closed Surgical Incision Chest Other (Comment) 11/30/24  1512  Chest  12   Wound 11/30/24 1512 Surgical Closed Surgical Incision Chest Right 11/30/24  1512  Chest  12   Wound 12/05/24 1000 Pressure Injury Buttocks Mid Deep Tissue Pressure Injury - Purple or maroon localized area of discolored intact skin or blood-filled blister due to damage of underlying soft tissue from pressure and/or shear. 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 VT; frequent ectopy R pleural effusion> hemothorax post thora, now s/p VATS 12/18 Hypoxic respiratory failure- related to volume overload and hemothorax, extubated 12/19 Postop large pericardial effusion- without clear tamponade physiology and unable to drain as appeared fibrinous Fevers, distributive shock- antifungal coverage added to vanc/zosyn  12/17; fever curve broke somewhat; antifungals Dc'd 12/20 as culture neg Ischemic bowel with pneumatosis; s/p partial ileum resection. 12/14 initial resection, 12/16 relook, closure, ostomy Hyperglycemia; h/o pre-DM. A1c 6.1 H/o tobacco abuse H/o asthma vs COPD ETOH abuse- no s/s of w/d and out of window Deconditioned state Anxiety/ICU delirium  Tachypneic after extubation but much seems to be anxiety.  ABG fine, CXR pending.  Will try some precedex  to start and at bedtime seroquel   Plan is impella wean today and perhaps decannulation tomorrow.  Inotropes and diuretics per AHF  On trickles, will try some water / clear liquids if tolerates  Heparin /amio gtt for now, has pacer wires so if plan is OR tomorrow, dc heparin  MN and hold for 12 hours then take out pacer wires  Abx end date tomorrow  Family updated at bedside   sodium chloride  20 mL/hr at 12/11/24 0312   amiodarone  30 mg/hr (12/12/24 0700)   dexmedetomidine  (PRECEDEX ) IV infusion     feeding supplement (VITAL 1.5 CAL) 20 mL/hr at 12/12/24 0700   heparin  1,050 Units/hr (12/12/24 0700)   milrinone  0.25 mcg/kg/min (12/12/24 0700)   norepinephrine  (LEVOPHED ) Adult infusion Stopped (12/11/24 1143)   piperacillin -tazobactam 12.5 mL/hr at 12/12/24 0700   sodium bicarbonate  25 mEq (Impella PURGE) in dextrose  5 % 1000  mL bag     TPN ADULT (ION) 72 mL/hr at 12/12/24 0700   vancomycin  Stopped (12/11/24 2356)     Critical care time:  35 minutes      CRITICAL CARE Performed by: Toribio [name]  Critical care time was exclusive of separately billable procedures and treating other patients.  Critical care was necessary to treat or prevent imminent or life-threatening deterioration.  Critical care was time spent personally by me on the following activities: development of treatment plan with patient and/or surrogate as well as nursing, discussions with consultants, evaluation of patient's response to treatment, examination of patient, obtaining history from patient or surrogate, ordering and performing treatments and interventions, ordering and review of laboratory studies, ordering and review of radiographic studies, pulse oximetry and re-evaluation of patient's condition.    "

## 2024-12-12 NOTE — Progress Notes (Addendum)
 ANTICOAGULATION CONSULT NOTE  Pharmacy Consult for heparin  Indication: bA/MVR  Allergies[1]  Patient Measurements: Height: 5' 10 (177.8 cm) Weight: 76.2 kg (167 lb 15.9 oz) IBW/kg (Calculated) : 73 Heparin  Dosing Weight: 70 kg   Vital Signs: Temp: 99 F (37.2 C) (12/20 1200) Temp Source: Core (12/20 0800) Pulse Rate: 105 (12/20 1200)  Labs: Recent Labs    12/10/24 2118 12/11/24 0455 12/11/24 0458 12/11/24 0656 12/11/24 2148 12/12/24 0355 12/12/24 0358 12/12/24 1112  HGB 9.5*   < > 9.5*  --   --  10.5* 10.4*  --   HCT 27.9*   < > 27.8*  --   --  31.0* 30.5*  --   PLT 158  --  178  --   --   --  251  --   HEPARINUNFRC  --   --   --    < > 0.10*  --  0.14* 0.20*  CREATININE 0.81  --  0.74  --   --   --  0.60*  --    < > = values in this interval not displayed.    Estimated Creatinine Clearance: 120.4 mL/min (A) (by C-G formula based on SCr of 0.6 mg/dL (L)).  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  Postop course complicated by ischemic bowel s/p exlap 12/11 with partial small bowel resection, abdomen left open.  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   Per Dr. Lucas, plan for 3 months of warfarin therapy with dual valve replacement. Not on anticoagulation prior to admission.  Pharmacy consulted for heparin  dosing.  Fixed heparin  500 units/h restarted postop 12/17 after discussing with CCM, HF, and CCS but s/p chest washout yesterday (12/18). Heparin  increased 12/19 to low range therapeutic goal with slow titration.  Heparin  level remains low at 0.2 but trending up. CBC stable. No bleeding issues.  Goal of Therapy:  HL 0.3-0.5 Monitor platelets by anticoagulation protocol: Yes    Plan:  Increase heparin  infusion to 1200 units/h Recheck heparin  level in 8h Na bicarb purge for Impella  Ozell Jamaica, PharmD, BCPS, [facility] Clinical Pharmacist [phone] Please check AMION for all [facility] Pharmacy numbers 12/12/2024         [1] No Known Allergies

## 2024-12-12 NOTE — Progress Notes (Signed)
 PT Cancellation Note  Patient Details Name: Alejandro Lopez MRN: [id number] DOB: 23-Oct-1981   Cancelled Treatment:    Reason Eval/Treat Not Completed: Medical issues which prohibited therapy this afternoon, per RN pt with episode of vomiting and RT also present to request therapies hold on evaluation at this time due to increased work of breathing. Will continue to follow and evaluate as appropriate.   Alejandro Lopez, PT, DPT   Acute Rehabilitation Department Office [phone] Secure Chat Communication Preferred   Alejandro Lopez 12/12/2024, 4:17 PM

## 2024-12-12 NOTE — Progress Notes (Signed)
 PHARMACY - ANTICOAGULATION CONSULT NOTE  Pharmacy Consult for heparin  Indication: bMVR/AVR and Impella  Labs: Recent Labs    12/11/24 0458 12/11/24 0656 12/12/24 0358 12/12/24 1112 12/12/24 1436 12/12/24 1445 12/12/24 2158  HGB 9.5*   < > 10.4*  --  10.9* 10.9*  --   HCT 27.8*   < > 30.5*  --  33.1* 32.0*  --   PLT 178  --  251  --  260  --   --   HEPARINUNFRC  --    < > 0.14* 0.20*  --   --  0.23*  CREATININE 0.74  --  0.60*  --  0.50*  --   --    < > = values in this interval not displayed.   Assessment: 43yo male subtherapeutic on heparin  but approaching goal with cautious rate increases; no infusion issues or signs of bleeding per RN.  Goal of Therapy:  Heparin  level 0.3-0.5 units/ml   Plan:  Increase heparin  infusion slightly to 1300 units/hr. Check level with am labs.   Marvetta Dauphin, PharmD, BCPS 12/12/2024 11:35 PM

## 2024-12-12 NOTE — Progress Notes (Signed)
 PHARMACY - TOTAL PARENTERAL NUTRITION CONSULT NOTE   Indication: massive bowel resection   Patient Measurements: Height: 5' 10 (177.8 cm) Weight: 76.2 kg (167 lb 15.9 oz) IBW/kg (Calculated) : 73 TPN AdjBW (KG): 69.9 Body mass index is 24.1 kg/m. Usual Weight: weight 79kg on admission, unclear baseline.   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 Patient underwent an aortic and mitral valve replacement on 12/8 with insertion of Impella. Post-op was found to have continuing HgB drop with noted abdominal pain. Was found to have hemorrhage from intercostal, chest tube and embolization was completed. Workup for dropping HgB included CT angio of chest/ab/and pelvis showed pneumatosis and concern for bowel ischemia. Patient went to OR on 12/12 for ex-lap found to have ischemia of distal ileum over 75cm without perforation. Patient was left in discontinuity with plans to return to OR.  Patient with diet throughout admission however noted nausea and abdominal pain during peri-op time period. Pharmacy consulted to manage TPN.   Glucose / Insulin : no hx DM, A1c 6.1% - CBGs < 180 Used 2 units sSSI in the past 24 hrs Electrolytes: Na/CL 146/109, CoCa 10.4 (iCa 1.22), Phos 3.6, K down 3.9 (received 70 meq yesterday outside of TPN), mg down 1.9 Renal: SCr 0.60 (BL SCr ~ 0.7), BUN down 29.  12/15 Lasix  60 mg IV x 2  12/16-12/19 Lasix  80 mg IV x BID 12/20 Lasix  80 mg IV once in AM  Hepatic: AST/ALT mildly elevated, tbili down 3.2 (no jaundice), alk phos wnl, TG 212, albumin  1.9 *elevation not due to TPN Intake / Output; MIVF: UOP 3.3 ml/kg/hr, NG 0mL, chest tube down to 110 mL, LBM 12/20 (75 mL), net -13.2L GI Imaging: 12/11 CT: pneumatosis of small bowel with gas, concerning for bowel ischemia  GI Surgeries / Procedures:  12/11 ex-lap with distal ileum with ischemia (75cm), left in  discontinuity with plans to return to OR on Sunday to evaluate for further ischemia   12/14 ex lap, placement of wound vac, right colectomy due to necrosis, abdomen left open  12/16 small bowel resection, end ileostomy, closure   Central access: PICC placed 12/04/24 TPN start date: 12/04/24  Nutritional Goals:  Goal concentrated TPN rate with lipids is 72 mL/hr (76g/L AA, 38g/L ILE, and 16.5% CHO will provide 131g AA, 285g CHO, 64g ILE and 2134 kcals per day)  RD Estimated Needs Total Energy Estimated Needs: 2200-2400kcals Total Protein Estimated Needs: 125-150 g Total Fluid Estimated Needs: 1.8 L  Current Nutrition:  TPN  Trickle tube feeds @20mL /hr   Plan:  Concentrate TPN as fluid resuscitation is completed receiving diuresis  Continue TPN with lipids at goal of 72 ml/hr.  Electrolytes in TPN: Na 0 mEq/L, K 78mEq/L, Ca 47mEq/L, Mg 46mEq/L, Phos 88mmol/L, max CL (ratio is 1:2.27, required for TPN compatibility) Add standard MVI and trace elements to TPN Continue sSSI Q4H  Monitor TPN labs on Mon/Thurs - labs in AM Tolerating tube feeds, will continue at same rate today per primary team    KCL x 4 runs IV to account for Lasix   Magnesium  sulfate 4g IV   Alejandro Lopez, PharmD

## 2024-12-12 NOTE — Progress Notes (Signed)
 2 Days Post-Op Procedures (LRB): VIDEO ASSISTED THORACOSCOPY (VATS)/DECORTICATION (Right) Subjective: Extubated yesterday, stable but tachypneic and anxious, wants to get out of bed Impella at P5, flow 2.2L with stable hemodynamics on amio 30, milrinone  0.25 - Co ox 71 Scant ostomy output, tolerating trickle feeds Afib yesterday  Objective: Vital signs in last 24 hours: Temp:  [97.3 F (36.3 C)-100.8 F (38.2 C)] 98.2 F (36.8 C) (12/20 0830) Pulse Rate:  [77-115] 110 (12/20 0830) Cardiac Rhythm: Atrial fibrillation (12/20 0400) Resp:  [13-50] 44 (12/20 0830) SpO2:  [93 %-100 %] 98 % (12/20 0830) Arterial Line BP: (114-155)/(64-88) 142/84 (12/20 0830) FiO2 (%):  [50 %] 50 % (12/20 0328) Weight:  [76.2 kg] 76.2 kg (12/20 0500)  Hemodynamic parameters for last 24 hours: PAP: (42-62)/(15-31) 52/28 CVP:  [3 mmHg-14 mmHg] 9 mmHg PCWP:  [19 mmHg] 19 mmHg CO:  [6.5 L/min-9.2 L/min] 6.6 L/min CI:  [3.26 L/min/m2-4.6 L/min/m2] 3.29 L/min/m2  Intake/Output from previous day: 12/19 0701 - 12/20 0700 In: 5129.5 [P.O.:20; I.V.:2513.6; NG/GT:779; IV Piggyback:1461.2] Out: 6010 [Urine:5825; Stool:75; Chest Tube:110] Intake/Output this shift: No intake/output data recorded.  General appearance: sitting upright in bed, looks comfortable but somewhat anxious Heart: Afib low 100s Lungs: Decreased breath sounds, chest tube in place with small amount of dark blood Abdomen: Ostomy in place, small amount of succus Extremities: improved edema  Wound: sternal intact  Lab Results: Recent Labs    12/11/24 0458 12/12/24 0355 12/12/24 0358  WBC 17.2*  --  20.6*  HGB 9.5* 10.5* 10.4*  HCT 27.8* 31.0* 30.5*  PLT 178  --  251   BMET:  Recent Labs    12/11/24 0458 12/12/24 0355 12/12/24 0358  NA 145 147* 146*  K 4.2 3.8 3.9  CL 110  --  109  CO2 28  --  29  GLUCOSE 123*  --  134*  BUN 32*  --  29*  CREATININE 0.74  --  0.60*  CALCIUM  8.6*  --  9.2    PT/INR:  No results for  input(s): LABPROT, INR in the last 72 hours.  ABG    Component Value Date/Time   PHART 7.437 12/12/2024 0355   HCO3 29.7 (H) 12/12/2024 0355   TCO2 31 12/12/2024 0355   ACIDBASEDEF 1.0 12/06/2024 1432   O2SAT 71.6 12/12/2024 0358   CBG (last 3)  Recent Labs    12/11/24 2312 12/12/24 0324 12/12/24 0733  GLUCAP 123* 123* 134*    Assessment/Plan: S/P Procedures (LRB): VIDEO ASSISTED THORACOSCOPY (VATS)/DECORTICATION (Right) 12/8 AVR/MVR impella 12/16 Abdominal closure and end ileostomy 12/18 R VATS chest washout  NEURO- Neuro intact, slowly uptitrating pain and anxiety meds - start dex this morning CV- in sinus tachy  Impella p5, 2.2 LPM  Weaning Impella per HF team, possibly remove swan and central line today too  Known moderate pericardial effusion - Will continue to monitor and try to wean off support without drainage.  RESP- CXR stable  Follow-up BAL from 12/18 - NGTD RENAL- cre and lytes stable, continue diuresis per HF team ENDO- CBG well controlled GI - Abdomen closed and end ileostomy on 12/16  On TPN  Cont trickle feeds until increased stoma output ID- Fevers improving somewhat but WBC up today, continue vanc/zosyn , mica on 12/17-12/19 - blood cultures sent on 12/17 - NGTD Remains critically ill  Dispo: ICU   LOS: 19 days    Con [name] 12/12/2024

## 2024-12-12 NOTE — Progress Notes (Signed)
 OT Cancellation Note  Patient Details Name: Alejandro Lopez MRN: [id number] DOB: [birth year]   Cancelled Treatment:    Reason Eval/Treat Not Completed: Medical issues which prohibited therapy (Per RN pt with episode of vomiting and RT also present to request therapies hold on evaluation at this time due to increased work of breathing. OT to reattempt to see pt for OT eval at a later time as appropriate/available.)  [name], OTR/L, MA Acute Rehab [phone]   [name] 12/12/2024, 4:22 PM

## 2024-12-12 NOTE — Progress Notes (Signed)
 ANTICOAGULATION CONSULT NOTE  Pharmacy Consult for heparin  Indication: bA/MVR  Allergies[1]  Patient Measurements: Height: 5' 10 (177.8 cm) Weight: 80 kg (176 lb 5.9 oz) IBW/kg (Calculated) : 73 Heparin  Dosing Weight: 70 kg   Vital Signs: Temp: 99 F (37.2 C) (12/20 0445) Temp Source: Core (12/20 0400) Pulse Rate: 112 (12/20 0445)  Labs: Recent Labs    12/10/24 2118 12/11/24 0455 12/11/24 0458 12/11/24 0656 12/11/24 1334 12/11/24 2148 12/12/24 0355 12/12/24 0358  HGB 9.5*   < > 9.5*  --   --   --  10.5* 10.4*  HCT 27.9*   < > 27.8*  --   --   --  31.0* 30.5*  PLT 158  --  178  --   --   --   --  251  HEPARINUNFRC  --   --   --    < > <0.10* 0.10*  --  0.14*  CREATININE 0.81  --  0.74  --   --   --   --  0.60*   < > = values in this interval not displayed.    Estimated Creatinine Clearance: 120.4 mL/min (A) (by C-G formula based on SCr of 0.6 mg/dL (L)).  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  Postop course complicated by ischemic bowel s/p exlap 12/11 with partial small bowel resection, abdomen left open.  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   Per Dr. Lucas, plan for 3 months of warfarin therapy with dual valve replacement. Not on anticoagulation prior to admission.  Pharmacy consulted for heparin  dosing.  Fixed heparin  500 units/h restarted postop 12/17 after discussing with CCM, HF, and CCS but s/p chest washout yesterday (12/18).  Heparin  level 0.14 on 900 units/hr (subtherapeutic).  No s/sx of bleeding or infusion issues per RN. Impella continues to run stable at P5 - no issues.   Goal of Therapy:  HL 0.3-0.5 Monitor platelets by anticoagulation protocol: Yes   Plan:   Increase heparin  infusion at 1050 units/hr 6h heparin  level Monitor ostomy output, bleeding  Thank you for allowing pharmacy to participate in this patient's care,  Lynwood Poplar, PharmD, BCPS Clinical Pharmacist 12/12/2024 5:02 AM        [1] No Known Allergies

## 2024-12-12 NOTE — Progress Notes (Signed)
 Patient turned in bed and got nauseous. Patient had a very small amount of regurgitation that looked like bile. No tube feeds in the regurgitation. Dann Hummer, MD notified. Verbal order to keep tube feeds at 97ml/hr today.

## 2024-12-12 NOTE — Progress Notes (Signed)
 Patient ID: Alejandro Lopez, male   DOB: 12-Sep-1981, 43 y.o.   MRN: [id number]     Advanced Heart Failure Rounding Note  Cardiologist: None  Chief Complaint: Valvular Heart Disease & HFrEF Patient Profile  43 y.o. male w/ h/o heavy alcohol use (at least 5 drinks per evening) and h/o asthma admitted w/ acute systolic heart failure. Strong family history of cardiac disease: mother and maternal grandmother had heart failure and father with CAD. Admitted with acute HFrEF/cardiogenic shock.   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  12/9 VT/VF ? vagal episode. Extubated 12/11: S/p R thoracentesis with resultant hemothorax.  Chest tube placed. S/p R intercostal artery coil (IR). S/p exp lap for ischemic bowel resection, bowel left in discontinuity 12/15: Back to OR for ischemic R colon and mesenteric ischemia. S/p exp/lap, wound vac exchange, R colectomy without anastomosis.  12/16 Back to OR for small bowel resection and ileostomy  12/18 OR for VATS/hemothorax washout, pericardial window was not done.  12/19 extubated  Subjective:    Impella 5.5  Flow (Liters/min): 1.9 Liters/min Performance Level: P4 Recent Labs    12/10/24 0617 12/11/24 0458 12/12/24 0358  LDH 715* 829* 922*    Extubated. Remains on impella at P-5 Flow 4.5. Waveforms ok.   Diuresing well on IV lasix . On milrinone  0.25 Off NE. Co-ox 66%  Remains tachypneic   PAP: (40-72)/(17-37) 50/23 CVP:  [3 mmHg-20 mmHg] 11 mmHg CO:  [5.6 L/min-9.2 L/min] 5.6 L/min CI:  [2.78 L/min/m2-4.6 L/min/m2] 2.78 L/min/m2    Objective:   Weight Range: 76.2 kg Body mass index is 24.1 kg/m.   Vital Signs:   Temp:  [97.7 F (36.5 C)-104.5 F (40.3 C)] 100.6 F (38.1 C) (12/20 1730) Pulse Rate:  [87-117] 117 (12/20 1730) Resp:  [0-53] 46 (12/20 1730) BP: (107-130)/(84-103) 125/102 (12/20 1530) SpO2:  [91 %-100 %] 97 % (12/20 1730) Arterial Line BP: (114-158)/(70-92) 131/83 (12/20  1730) FiO2 (%):  [50 %-60 %] 60 % (12/20 1651) Weight:  [76.2 kg] 76.2 kg (12/20 0500) Last BM Date : 12/12/24  Weight change: Filed Weights   12/10/24 0500 12/11/24 0500 12/12/24 0500  Weight: 81.3 kg 80 kg 76.2 kg    Intake/Output:   Intake/Output Summary (Last 24 hours) at 12/12/2024 1753 Last data filed at 12/12/2024 1700 Gross per 24 hour  Intake 5735.98 ml  Output 5975 ml  Net -239.02 ml     Physical Exam   General:  Sitting up in bed. tachypneic HEENT: normal Neck: supple. RIJ swan  Cor: Regular rate & rhythm. No rubs, gallops or murmurs. Lungs: clear Abdomen: soft, nontender, + ostomy site Extremities: no cyanosis, clubbing, rash, edema Neuro: alert & orientedx3, cranial nerves grossly intact. moves all 4 extremities w/o difficulty. Affect pleasant   Telemetry   NSR 90-110 Personally reviewed  Labs    CBC Recent Labs    12/12/24 0358 12/12/24 1436 12/12/24 1445  WBC 20.6* 21.0*  --   HGB 10.4* 10.9* 10.9*  HCT 30.5* 33.1* 32.0*  MCV 89.2 90.7  --   PLT 251 260  --    Basic Metabolic Panel Recent Labs    87/81/74 0617 12/10/24 1426 12/11/24 0458 12/12/24 0355 12/12/24 0358 12/12/24 1436 12/12/24 1445  NA 146*   < > 145   < > 146* 145 144  K 4.0   < > 4.2   < > 3.9 4.3 4.1  CL 109   < > 110  --  109 108  --   CO2 28   < > 28  --  29 27  --   GLUCOSE 126*   < > 123*  --  134* 134*  --   BUN 34*   < > 32*  --  29* 27*  --   CREATININE 0.75   < > 0.74  --  0.60* 0.50*  --   CALCIUM  8.7*   < > 8.6*  --  9.2 9.4  --   MG 2.7*   < > 2.1  --  1.9  --   --   PHOS 3.7  --   --   --  3.6  --   --    < > = values in this interval not displayed.   Liver Function Tests Recent Labs    12/12/24 0358 12/12/24 1436  AST 191* 205*  ALT 122* 150*  ALKPHOS 104 115  BILITOT 3.0* 2.7*  PROT 6.1* 6.6  ALBUMIN  2.2* 2.4*   No results for input(s): LIPASE, AMYLASE in the last 72 hours. Cardiac Enzymes No results for input(s): CKTOTAL, CKMB,  CKMBINDEX, TROPONINI in the last 72 hours.  BNP: BNP (last 3 results) No results for input(s): BNP in the last 8760 hours.  ProBNP (last 3 results) Recent Labs    11/22/24 1958  PROBNP 3,354.0*     D-Dimer No results for input(s): DDIMER in the last 72 hours.  Hemoglobin A1C No results for input(s): HGBA1C in the last 72 hours. Fasting Lipid Panel Recent Labs    12/10/24 0029  TRIG 212*   Thyroid Function Tests No results for input(s): TSH, T4TOTAL, T3FREE, THYROIDAB in the last 72 hours.  Invalid input(s): FREET3  Other results:   Imaging    DG CHEST PORT 1 VIEW Result Date: 12/04/2024 CLINICAL DATA:  Ventilator dependence. EXAM: PORTABLE CHEST 1 VIEW COMPARISON:  12/03/2024 FINDINGS: Endotracheal tube tip is approximately 3.8 cm above the base of the carina. The NG tube passes into the stomach although the distal tip position is not included on the film. Right-sided Impella device is stable in position. A left IJ pulmonary artery catheter tip overlies expected location of the interlobar pulmonary artery. Right pleural drain again noted with persistent right pleural fluid. Basilar collapse/consolidation noted, IMPRESSION: 1. No substantial interval change. 2. Persistent right pleural effusion with right base collapse/consolidation. 3. Support apparatus as above. Electronically Signed   By: Camellia Candle M.D.   On: 12/04/2024 07:15   DG Abd 1 View Result Date: 12/04/2024 CLINICAL DATA:   Routine adult health maintenance Best images obtainable due to patient's condition EXAM: ABDOMEN - 1 VIEW COMPARISON:  12/04/2024 FINDINGS: NG tube tip is in the distal stomach. Diffuse gaseous distention of colon evident, similar to prior. IMPRESSION: NG tube tip is in the distal stomach. Electronically Signed   By: Camellia Candle M.D.   On: 12/04/2024 07:11   CT Angio Chest/Abd/Pel for Dissection W and/or W/WO Addendum Date: 12/03/2024 ADDENDUM #1 ADDENDUM:  ---------------------------------------------------- These results were called to Dr. Mamye Bolds  at 10:15 pm on 12/03/2024. Electronically signed by: Franky Crease MD 12/03/2024 10:23 PM EST RP Workstation: [id number]   Result Date: 12/03/2024 ORIGINAL REPORT EXAM: CT CHEST, ABDOMEN AND PELVIS WITH AND WITHOUT CONTRAST 12/03/2024 09:52:07 PM TECHNIQUE: CT of the chest, abdomen and pelvis was performed with and without the administration of intravenous contrast. Multiplanar reformatted images are provided for review. Automated exposure control, iterative reconstruction, and/or weight based adjustment of the mA/kV was  utilized to reduce the radiation dose to as low as reasonably achievable. COMPARISON: None available. CLINICAL HISTORY: Acute aortic syndrome (AAS) suspected; Evaluate for intercostal bleeding or other sources after thoracentesis; also evaluate bowel. FINDINGS: CHEST: MEDIASTINUM AND LYMPH NODES: Mild cardiomegaly. Impella device tip in the left ventricle. Swan-ganz catheter tip in the central right pulmonary artery. No evidence of aortic aneurysm or dissection. The central airways are clear. No mediastinal, hilar or axillary lymphadenopathy. LUNGS AND PLEURA: Right chest tube in place. Moderate to large right pleural effusion with compressive atelectasis in the right middle lobe and right lower lobe. There appears to be active extravasation of contrast posteriorly between the right 9th and 10th ribs and likely into the pleural space, possibly related to prior thoracentesis. The pleural fluid appears complex on the right, likely hemothorax. Small left pleural effusion with compressive atelectasis in the left lower lobe. Tiny right pneumothorax. Biapical subpleural blebs. No evidence of pulmonary embolus. ABDOMEN AND PELVIS: LIVER: Gas is seen branching peripherally in the liver compatible with portal venous gas. GALLBLADDER AND BILE DUCTS: Gallbladder is unremarkable. No biliary ductal dilatation. SPLEEN: No  acute abnormality. PANCREAS: No acute abnormality. ADRENAL GLANDS: No acute abnormality. KIDNEYS, URETERS AND BLADDER: No stones in the kidneys or ureters. No hydronephrosis. No perinephric or periureteral stranding. Urinary bladder is unremarkable. GI AND BOWEL: Stomach and small bowel are decompressed. Diffuse gaseous distention of the colon suggests ileus. Moderate stool burden in the rectosigmoid colon. Pneumatosis noted in small bowel loops in the lower pelvis with air in the mesenteric veins feeding these small bowel loops. Pneumatosis also noted in the right colon wall. REPRODUCTIVE ORGANS: No acute abnormality. PERITONEUM AND RETROPERITONEUM: No ascites. No free air. VASCULATURE: Aorta is normal in caliber. ABDOMINAL AND PELVIS LYMPH NODES: No lymphadenopathy. BONES AND SOFT TISSUES: No acute osseous abnormality. No focal soft tissue abnormality. IMPRESSION: 1. Active contrast extravasation between the right 9th and 10th ribs with likely extension into the pleural space, suspicious for ongoing intercostal bleeding likely related to recent thoracentesis. 2. Moderate to large right pleural effusion compatible with hemothorax with compressive atelectasis in the right middle and lower lobes; right chest tube present; tiny right pneumothorax. 3. No evidence of aortic aneurysm, aortic dissection, or pulmonary embolus. 4. Pneumatosis involving small bowel loops in the lower pelvis and the right colon with mesenteric venous gas and portal venous gas, concerning for bowel ischemia. Recommend surgical consultation. 5. Small left pleural effusion with compressive atelectasis in the left lower lobe. 6. . Attempts are being made to contact the ordering physician with the results. An addendum will be made at that time. Electronically signed by: Franky Crease MD 12/03/2024 10:09 PM EST RP Workstation: [id number]   DG Chest Port 1 View Result Date: 12/03/2024 EXAM: 1 VIEW(S) XRAY OF THE CHEST 12/03/2024 07:57:00 PM  COMPARISON: Portable chest today at 7:02 pm. CLINICAL HISTORY: Encounter for chest tube placement. FINDINGS: LINES, TUBES AND DEVICES: A pigtail chest tube has been inserted on the right through the 6th intercostal space, with the pigtail in the lateral upper right thorax. Left IJ Swan-Ganz line again terminates in the distal right pulmonary artery. Stable positioning of right subclavian Impella device. LUNGS AND PLEURA: No pneumothorax. Moderate to large right pleural effusion is still present but decreased in the interval. Small left pleural effusion unchanged. The right mid to lower lung is obscured by pleural fluid. There is patchy consolidation in the left lower lung field. Left mid and both apical lungs are clear. HEART AND  MEDIASTINUM: Sternotomy sutures and two cardiac valve replacements are again shown. There is mild cardiomegaly and mild central vascular prominence as before. BONES AND SOFT TISSUES: No acute osseous abnormality. IMPRESSION: 1. Right pigtail chest tube in position with no measurable pneumothorax. 2. Moderate to large right pleural effusion, decreased compared to earlier today. 3. Patchy consolidation in the left lower lung field. 4. Small left pleural effusion, unchanged. 5. Cardiomegaly . Electronically signed by: Francis Quam MD 12/03/2024 08:10 PM EST RP Workstation: [id number]   DG CHEST PORT 1 VIEW Result Date: 12/03/2024 EXAM: 1 VIEW(S) XRAY OF THE CHEST 12/03/2024 07:05:00 PM COMPARISON: None available. CLINICAL HISTORY: ABLA (acute blood loss anemia). FINDINGS: LINES, TUBES AND DEVICES: Impella device and Swan-Ganz catheter remain in place, unchanged. LUNGS AND PLEURA: Enlarging right pleural effusion, now large with atelectasis throughout the right lung. Vascular congestion and left basilar atelectasis. Low lung volumes. No pneumothorax. HEART AND MEDIASTINUM: No acute abnormality of the cardiac and mediastinal silhouettes. BONES AND SOFT TISSUES: No acute osseous abnormality.  IMPRESSION: 1. Enlarging right pleural effusion, now large, with associated right lung atelectasis. 2. Vascular congestion with left basilar atelectasis. 3. Low lung volumes. Electronically signed by: Franky Crease MD 12/03/2024 07:10 PM EST RP Workstation: [id number]   DG Chest Port 1 View Result Date: 12/03/2024 CLINICAL DATA:  Pleural effusion, status post thoracentesis EXAM: PORTABLE CHEST 1 VIEW COMPARISON:  12/03/2024 FINDINGS: Single frontal view of the chest demonstrates stable left internal jugular flow directed central venous catheter, tip overlying the right infrahilar region. Stable mitral and aortic valve prostheses. Cardiac silhouette remains enlarged. Lung volumes are diminished, with bibasilar veiling opacities, right greater than left, consistent with consolidation and effusions. No appreciable change since prior study. No evidence of pneumothorax. IMPRESSION: 1. Persistent bibasilar consolidation and effusions, right greater than left. 2. No evidence of pneumothorax. 3. Stable enlarged cardiac silhouette. Electronically Signed   By: Ozell Daring M.D.   On: 12/03/2024 16:10    Medications:   Scheduled Medications:  arformoterol   15 mcg Nebulization BID   Chlorhexidine  Gluconate Cloth  6 each Topical Daily   colchicine   0.6 mg Per Tube Daily   feeding supplement (PROSource TF20)  60 mL Per Tube Daily   folic acid   1 mg Per Tube Daily   free water   100 mL Per Tube Q6H   insulin  aspart  0-9 Units Subcutaneous Q4H   lidocaine   1 patch Transdermal Q24H   mouth rinse  15 mL Mouth Rinse Q2H   pantoprazole  (PROTONIX ) IV  40 mg Intravenous Daily   PHENObarbital   97.5 mg Intravenous Q8H   Followed by   [START ON 12/14/2024] PHENObarbital   65 mg Intravenous Q8H   Followed by   [START ON 12/16/2024] PHENObarbital   32.5 mg Intravenous Q8H   QUEtiapine   25 mg Per Tube QHS   revefenacin   175 mcg Nebulization Daily   sodium chloride  flush  3 mL Intravenous Q12H   sodium chloride   HYPERTONIC  4 mL Nebulization BID   thiamine   100 mg Per Tube Daily    Infusions:  sodium chloride  20 mL/hr at 12/11/24 0312   amiodarone  30 mg/hr (12/12/24 1700)   dexmedetomidine  (PRECEDEX ) IV infusion 0.7 mcg/kg/hr (12/12/24 1700)   feeding supplement (VITAL 1.5 CAL) 20 mL/hr at 12/12/24 1700   heparin  1,200 Units/hr (12/12/24 1700)   magnesium  sulfate bolus IVPB 2 g (12/12/24 1725)   meropenem  (MERREM ) IV Stopped (12/12/24 1633)   milrinone  0.25 mcg/kg/min (12/12/24 1700)   norepinephrine  (LEVOPHED ) Adult  infusion Stopped (12/11/24 1143)   sodium bicarbonate  25 mEq (Impella PURGE) in dextrose  5 % 1000 mL bag     TPN ADULT (ION) 72 mL/hr at 12/12/24 1700   TPN ADULT (ION)     vancomycin  Stopped (12/12/24 1233)    PRN Medications: sodium chloride , sodium chloride , albuterol , hydrALAZINE , LORazepam , morphine  injection, ondansetron  (ZOFRAN ) IV, mouth rinse, mouth rinse, oxyCODONE , sodium chloride , sodium chloride  flush  Assessment/Plan  1.  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  Valvular Disease  - severe MR posteriorly directed, mod TR. Likely functional.  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 Acute Systolic Heart Failure w/ Biventricular Dysfunction >> caridogenic shock - HS trop not c/w ACS but EKG w/ anteroseptal Qs  - CMRI Severe AR/MR. LVEF 35%  - Cath- normal cors, RA5, PA 32/17 (24), PVR 2.57 Papi 3. CO 4.9 CI 3.8 - Post-op Echo 12/02/24: EF < 20% with severe LVH (new, ?inflammatory), moderately decreased RV function, stable bioprosthetic MV and AoV.  - Impella @ P5 Co-ox ox. Drop to P-4 today. Follow co-ox. Continue milrinone . Can pull swan. Follow CVP and co-ox via PICC - Holding digoxin  - Holding GDMT until back off pressors.  - Volume looks pretty good.  Change lasix  to daily  3. R hemothorax - Hemothorax s/p right thoracentesis with intercostal artery  injury. Massive bleeding requiring multiple products and development of hemorrhagic shock, now improved. S/p IR embolization.  - S/p VATS 12/18 with right chest washout.  - Back on low dose heparin  - Chest tube in place, 265 cc output last 24 hrs  4. Acute hypoxic respiratory failure - now extubated post-op - remains at high-risk for reintubation - d/w CCM and TCTS at bedside   5.  ETOH Abuse - heavy drinker, at least 5 drinks per evening - may be contributing to CM, reduction of intake imperative  - No evidence of withdrawal   6  Hypomagnesemia - follow; goal >2    7 . DMII  - Hgb A1C 6.1 - On SSI.  8. Ischemic bowel Mesenteric ischemia - CXR with dilated bowels.  - CT scan demonstrated active extravasation from a posterior intercostal artery. Also demonstrated portal venous gas and pneumatosis of the small and large bowel.  - S/p exp/lap 12/11. Patchy ischemia of the distal ileum extending over about 75 cm without perforation. Ischemic segment resected.  Colon was distended with gas but viable. Small bowel left in discontinuity.  - Back to OR 12/15 for ischemic R colon and mesenteric ischemia. S/p exp/lap, wound vac exchange, R colectomy without anastomosis.  - Returned to OR 12/16 for ileostomy creation.  - GSU following. On TPN. TFs started  9. ID - Covering bowel pathogens with Zosyn .  - Micafungin  started on 12/16 then stopped 12/18. Had candida albicans in trach aspirate 12/14, no organisms in BAL 12/18.  - Blood cultures with no growth  10. AKI - AKI with shock.  - Resolved  11. FEN - TPN. TFs started  12. VT - Quiescent - On amiodarone  gtt.   13. Pericardial effusion - There is a moderate effusion without tamponade, noted again by echo 12/16.  Will need to follow over time.  - Limited echo 12/17 with moderate effusion.  - This was not drained at time of VATS/right hemothorax washout on 12/18 - Continue to folow d/w TCTS  14 Atrial flutter - Noted  transiently.  Now in NSR on amiodarone  gtt.  - On heparin  gtt, will eventually be on warfarin for valves.   CRITICAL CARE Performed by: Toribio Fuel  Total critical care time: 40 minutes  Critical care time was exclusive of separately billable procedures and treating other patients.  Critical care was necessary to treat or prevent imminent or life-threatening deterioration.  Critical care was time spent personally by me on the following activities: development of treatment plan with patient and/or surrogate as well as nursing, discussions with consultants, evaluation of patient's response to treatment, examination of patient, obtaining history from patient or surrogate, ordering and performing treatments and interventions, ordering and review of laboratory studies, ordering and review of radiographic studies, pulse oximetry and re-evaluation of patient's condition.   Mazie Fencl 12/12/2024 5:53 PM

## 2024-12-12 NOTE — Progress Notes (Signed)
 2 Days Post-Op   Subjective/Chief Complaint: Alert, extubated, denies pain at present Passing some stool per rectum. Afebrile this AM, WBC 20 (17), hgb stable   Objective: Vital signs in last 24 hours: Temp:  [97.3 F (36.3 C)-100.8 F (38.2 C)] 98.2 F (36.8 C) (12/20 0830) Pulse Rate:  [77-115] 110 (12/20 0830) Resp:  [13-50] 44 (12/20 0830) SpO2:  [93 %-100 %] 98 % (12/20 0830) Arterial Line BP: (114-155)/(64-88) 142/84 (12/20 0830) FiO2 (%):  [50 %] 50 % (12/20 0328) Weight:  [76.2 kg] 76.2 kg (12/20 0500) Last BM Date : 12/11/24  Intake/Output from previous day: 12/19 0701 - 12/20 0700 In: 5129.5 [P.O.:20; I.V.:2513.6; NG/GT:779; IV Piggyback:1461.2] Out: 6010 [Urine:5825; Stool:75; Chest Tube:110] Intake/Output this shift: No intake/output data recorded. Alert, cooperative CV - HR low 100's, no extremity edema  Pulm - slightly labored respirations on Anna Ab soft, mild distention, hypoactive BS,  ileostomy pink, vac in place  Ileostomy - 75 mL brown/blood tinged   VAC - SS   TF via cortrak at 20 mL/hr  Lab Results:  Recent Labs    12/11/24 0458 12/12/24 0355 12/12/24 0358  WBC 17.2*  --  20.6*  HGB 9.5* 10.5* 10.4*  HCT 27.8* 31.0* 30.5*  PLT 178  --  251   BMET Recent Labs    12/11/24 0458 12/12/24 0355 12/12/24 0358  NA 145 147* 146*  K 4.2 3.8 3.9  CL 110  --  109  CO2 28  --  29  GLUCOSE 123*  --  134*  BUN 32*  --  29*  CREATININE 0.74  --  0.60*  CALCIUM  8.6*  --  9.2   PT/INR No results for input(s): LABPROT, INR in the last 72 hours. ABG Recent Labs    12/11/24 0455 12/12/24 0355  PHART 7.407 7.437  HCO3 28.2* 29.7*    Studies/Results: DG Chest Port 1 View Result Date: 12/12/2024 EXAM: 1 VIEW(S) XRAY OF THE CHEST 12/12/2024 07:46:00 AM COMPARISON: 12/11/2024 CLINICAL HISTORY: SOB (shortness of breath) FINDINGS: LINES, TUBES AND DEVICES: Impella device in place. Left IJ Swan-Ganz catheter in place with tip overlying right  pulmonary artery. Right chest tube in place. Enteric tube in place with distal tip beyond inferior margin of film. LUNGS AND PLEURA: Low lung volumes. Mild pulmonary edema. Hazy bibasilar opacities. Right pleural effusion, unchanged. SABRA No pneumothorax. HEART AND MEDIASTINUM: Status post median sternotomy and valve replacement. BONES AND SOFT TISSUES: Status post median sternotomy and valve replacement. IMPRESSION: 1. Unchanged mild pulmonary edema with right pleural effusion and bibasilar atelectasis. 2. Impella device, left IJ Swan-Ganz catheter, right chest tube, and enteric tube in place. Electronically signed by: Waddell Calk MD 12/12/2024 07:53 AM EST RP Workstation: [id number]   DG CHEST PORT 1 VIEW Result Date: 12/11/2024 CLINICAL DATA:  Status post cardiac surgery. EXAM: PORTABLE CHEST 1 VIEW COMPARISON:  12/10/2024 and CT dated 12/09/2024. FINDINGS: Stable enlarged cardiac silhouette and prosthetic aortic and mitral valves. The right jugular Impella device tip remains in the left ventricle. The left jugular Swan-Ganz catheter tip is in the right main pulmonary artery. Endotracheal tube in satisfactory position. Right PICC tip in the region of the superior cavoatrial junction. Nasogastric tube extending into the stomach with its tip not included. The right pleural catheter has been replaced with a large-bore chest tube with no pneumothorax seen. The previously demonstrated right pleural fluid has almost completely resolved. Decreased right basilar atelectasis and increased patchy density in the left lower lobe. Thoracic spine  degenerative changes. IMPRESSION: 1. The previously demonstrated right pleural fluid has almost completely resolved following placement of a large-bore chest tube. 2. Decreased right basilar atelectasis and increased patchy atelectasis or pneumonia in the left lower lobe. 3. Stable cardiomegaly. Electronically Signed   By: Elspeth Bathe M.D.   On: 12/11/2024 13:38     Anti-infectives: Anti-infectives (From admission, onward)    Start     Dose/Rate Route Frequency Ordered Stop   12/10/24 1000  micafungin  (MYCAMINE ) 100 mg in sodium chloride  0.9 % 100 mL IVPB  Status:  Discontinued       Placed in Followed by Linked Group   100 mg 105 mL/hr over 1 Hours Intravenous Every 24 hours 12/09/24 0704 12/11/24 0710   12/09/24 0830  micafungin  (MYCAMINE ) 200 mg in sodium chloride  0.9 % 100 mL IVPB       Placed in Followed by Linked Group   200 mg 110 mL/hr over 1 Hours Intravenous  Once 12/09/24 0704 12/09/24 1101   12/06/24 2200  vancomycin  (VANCOREADY) IVPB 1250 mg/250 mL        1,250 mg 166.7 mL/hr over 90 Minutes Intravenous Every 12 hours 12/06/24 0811 12/13/24 2359   12/06/24 0900  vancomycin  (VANCOREADY) IVPB 1750 mg/350 mL        1,750 mg 175 mL/hr over 120 Minutes Intravenous  Once 12/06/24 0802 12/06/24 1032   12/04/24 1100  vancomycin  (VANCOCIN ) IVPB 1000 mg/200 mL premix  Status:  Discontinued        1,000 mg 200 mL/hr over 60 Minutes Intravenous Every 12 hours 12/03/24 2238 12/03/24 2242   12/04/24 0000  vancomycin  (VANCOREADY) IVPB 1500 mg/300 mL  Status:  Discontinued        1,500 mg 150 mL/hr over 120 Minutes Intravenous  Once 12/03/24 2236 12/03/24 2242   12/03/24 2300  piperacillin -tazobactam (ZOSYN ) IVPB 3.375 g        3.375 g 12.5 mL/hr over 240 Minutes Intravenous Every 8 hours 12/03/24 2236 12/13/24 2359   11/30/24 2130  vancomycin  (VANCOCIN ) IVPB 1000 mg/200 mL premix        1,000 mg 200 mL/hr over 60 Minutes Intravenous  Once 11/30/24 1609 11/30/24 2351   11/30/24 1615  ceFAZolin  (ANCEF ) IVPB 2g/100 mL premix        2 g 200 mL/hr over 30 Minutes Intravenous Every 8 hours 11/30/24 1609 12/02/24 0546   11/30/24 0400  vancomycin  (VANCOREADY) IVPB 1250 mg/250 mL        1,250 mg 166.7 mL/hr over 90 Minutes Intravenous To Surgery 11/29/24 1040 11/30/24 0856   11/30/24 0400  ceFAZolin  (ANCEF ) IVPB 2g/100 mL premix        2  g 200 mL/hr over 30 Minutes Intravenous To Surgery 11/29/24 1040 11/30/24 1239   11/30/24 0400  ceFAZolin  (ANCEF ) IVPB 2g/100 mL premix  Status:  Discontinued        2 g 200 mL/hr over 30 Minutes Intravenous To Surgery 11/29/24 1036 11/30/24 1609       Assessment/Plan: 43 y/o M S/P AVR, MCR, Impella placement by Dr. Lucas 12/8   S/P right thoracentesis 12/11 with hemorrhage from intercostal -chest tube placed by TCTS.  S/p emergent IR embolizaton  R intercostal artery by Dr. Philip.   S/P VATS 12/18   Pneumatosis of the pelvic small bowel and R colon with portal venous gas on CT  POD 8/6/4 status post ex lap with SBR & VAC placement, repeat laparotomy with patchy small bowel ischemia, end ileostomy/ab closure -woc  following -vac changes twice weekly - passed bedside swallow eval, allow clears, ok for TF @ 30 mL/hr would not advance more today. Some liquid in ostomy but it is not really working yet.  - discussed expected post-operative course with patient and wife, questions welcomed and answered   FEN: CLD, boost breeze ok, gastric cortrak @ 30 mL/hr, IVF per primary ID: Zosyn , Micafungin , Vanc; resp cx 12/18 NGTD, BCx 12/17 no growth 3 days VTE: heparin  gtt Foley: in place Dispo: ICU  Alejandro Lopez 12/12/2024

## 2024-12-12 NOTE — Progress Notes (Signed)
 RT placed pt on heated HFNC 20L/50% due to pt having an increase in WOB and tachypnea. Pt tolerating it well at this time.

## 2024-12-12 NOTE — Progress Notes (Signed)
 TCTS PM Rounding Progress Note  Remains tachypneic and had a large volume emesis this afternoon that looked like bile and tube feeds  Tube feeds off now Saturating well on HHFNC, gas is re-assuring but has visible increased work of breathing  Vitals:   12/12/24 1800 12/12/24 1815  BP:    Pulse: (!) 114 (!) 114  Resp: (!) 67 (!) 44  Temp: (!) 100.8 F (38.2 C) (!) 100.8 F (38.2 C)  SpO2: 96% 97%    Plan:  - Continue to monitor respiratory status - Continue to hold tube feeds  Con Clunes, MD Cardiothoracic Surgery Pager: [phone]

## 2024-12-13 ENCOUNTER — Inpatient Hospital Stay (HOSPITAL_COMMUNITY)

## 2024-12-13 DIAGNOSIS — J9690 Respiratory failure, unspecified, unspecified whether with hypoxia or hypercapnia: Secondary | ICD-10-CM | POA: Diagnosis not present

## 2024-12-13 DIAGNOSIS — I5021 Acute systolic (congestive) heart failure: Secondary | ICD-10-CM | POA: Diagnosis not present

## 2024-12-13 LAB — POCT I-STAT EG7
Acid-Base Excess: 2 mmol/L (ref 0.0–2.0)
Bicarbonate: 27.4 mmol/L (ref 20.0–28.0)
Calcium, Ion: 1.21 mmol/L (ref 1.15–1.40)
HCT: 28 % — ABNORMAL LOW (ref 39.0–52.0)
Hemoglobin: 9.5 g/dL — ABNORMAL LOW (ref 13.0–17.0)
O2 Saturation: 100 %
Patient temperature: 37.3
Potassium: 5.3 mmol/L — ABNORMAL HIGH (ref 3.5–5.1)
Sodium: 141 mmol/L (ref 135–145)
TCO2: 29 mmol/L (ref 22–32)
pCO2, Ven: 47.1 mmHg (ref 44–60)
pH, Ven: 7.375 (ref 7.25–7.43)
pO2, Ven: 269 mmHg — ABNORMAL HIGH (ref 32–45)

## 2024-12-13 LAB — COMPREHENSIVE METABOLIC PANEL WITH GFR
ALT: 182 U/L — ABNORMAL HIGH (ref 0–44)
AST: 207 U/L — ABNORMAL HIGH (ref 15–41)
Albumin: 2.2 g/dL — ABNORMAL LOW (ref 3.5–5.0)
Alkaline Phosphatase: 117 U/L (ref 38–126)
Anion gap: 7 (ref 5–15)
BUN: 44 mg/dL — ABNORMAL HIGH (ref 6–20)
CO2: 29 mmol/L (ref 22–32)
Calcium: 9.1 mg/dL (ref 8.9–10.3)
Chloride: 106 mmol/L (ref 98–111)
Creatinine, Ser: 0.98 mg/dL (ref 0.61–1.24)
GFR, Estimated: >60 mL/min
Glucose, Bld: 156 mg/dL — ABNORMAL HIGH (ref 70–99)
Potassium: 5.5 mmol/L — ABNORMAL HIGH (ref 3.5–5.1)
Sodium: 142 mmol/L (ref 135–145)
Total Bilirubin: 2.4 mg/dL — ABNORMAL HIGH (ref 0.0–1.2)
Total Protein: 6.4 g/dL — ABNORMAL LOW (ref 6.5–8.1)

## 2024-12-13 LAB — POCT I-STAT 7, (LYTES, BLD GAS, ICA,H+H)
Acid-Base Excess: 4 mmol/L — ABNORMAL HIGH (ref 0.0–2.0)
Acid-base deficit: 1 mmol/L (ref 0.0–2.0)
Acid-base deficit: 1 mmol/L (ref 0.0–2.0)
Bicarbonate: 30.1 mmol/L — ABNORMAL HIGH (ref 20.0–28.0)
Bicarbonate: 29.1 mmol/L — ABNORMAL HIGH (ref 20.0–28.0)
Bicarbonate: 27.9 mmol/L (ref 20.0–28.0)
Calcium, Ion: 1.26 mmol/L (ref 1.15–1.40)
Calcium, Ion: 1.24 mmol/L (ref 1.15–1.40)
Calcium, Ion: 1.22 mmol/L (ref 1.15–1.40)
HCT: 31 % — ABNORMAL LOW (ref 39.0–52.0)
HCT: 30 % — ABNORMAL LOW (ref 39.0–52.0)
HCT: 27 % — ABNORMAL LOW (ref 39.0–52.0)
Hemoglobin: 10.5 g/dL — ABNORMAL LOW (ref 13.0–17.0)
Hemoglobin: 10.2 g/dL — ABNORMAL LOW (ref 13.0–17.0)
Hemoglobin: 9.2 g/dL — ABNORMAL LOW (ref 13.0–17.0)
O2 Saturation: 88 %
O2 Saturation: 99 %
O2 Saturation: 100 %
Patient temperature: 37.5
Patient temperature: 37
Patient temperature: 38.2
Potassium: 5.6 mmol/L — ABNORMAL HIGH (ref 3.5–5.1)
Potassium: 5.5 mmol/L — ABNORMAL HIGH (ref 3.5–5.1)
Potassium: 4.9 mmol/L (ref 3.5–5.1)
Sodium: 142 mmol/L (ref 135–145)
Sodium: 141 mmol/L (ref 135–145)
Sodium: 144 mmol/L (ref 135–145)
TCO2: 33 mmol/L — ABNORMAL HIGH (ref 22–32)
TCO2: 32 mmol/L (ref 22–32)
TCO2: 29 mmol/L (ref 22–32)
pCO2 arterial: 91.3 mmHg (ref 32–48)
pCO2 arterial: 84.5 mmHg (ref 32–48)
pCO2 arterial: 42.3 mmHg (ref 32–48)
pH, Arterial: 7.129 — CL (ref 7.35–7.45)
pH, Arterial: 7.145 — CL (ref 7.35–7.45)
pH, Arterial: 7.432 (ref 7.35–7.45)
pO2, Arterial: 76 mmHg — ABNORMAL LOW (ref 83–108)
pO2, Arterial: 215 mmHg — ABNORMAL HIGH (ref 83–108)
pO2, Arterial: 191 mmHg — ABNORMAL HIGH (ref 83–108)

## 2024-12-13 LAB — HEPARIN LEVEL (UNFRACTIONATED)
Heparin Unfractionated: 0.23 [IU]/mL — ABNORMAL LOW (ref 0.30–0.70)
Heparin Unfractionated: 0.32 [IU]/mL (ref 0.30–0.70)

## 2024-12-13 LAB — PHOSPHORUS
Phosphorus: 7.7 mg/dL — ABNORMAL HIGH (ref 2.5–4.6)
Phosphorus: 5.5 mg/dL — ABNORMAL HIGH (ref 2.5–4.6)

## 2024-12-13 LAB — CBC
HCT: 30.3 % — ABNORMAL LOW (ref 39.0–52.0)
Hemoglobin: 9.9 g/dL — ABNORMAL LOW (ref 13.0–17.0)
MCH: 30.7 pg (ref 26.0–34.0)
MCHC: 32.7 g/dL (ref 30.0–36.0)
MCV: 93.8 fL (ref 80.0–100.0)
Platelets: 283 K/uL (ref 150–400)
RBC: 3.23 MIL/uL — ABNORMAL LOW (ref 4.22–5.81)
RDW: 19.2 % — ABNORMAL HIGH (ref 11.5–15.5)
WBC: 22.8 K/uL — ABNORMAL HIGH (ref 4.0–10.5)
nRBC: 0.5 % — ABNORMAL HIGH (ref 0.0–0.2)

## 2024-12-13 LAB — RENAL FUNCTION PANEL
Albumin: 2.1 g/dL — ABNORMAL LOW (ref 3.5–5.0)
Anion gap: 7 (ref 5–15)
BUN: 45 mg/dL — ABNORMAL HIGH (ref 6–20)
CO2: 27 mmol/L (ref 22–32)
Calcium: 8.9 mg/dL (ref 8.9–10.3)
Chloride: 109 mmol/L (ref 98–111)
Creatinine, Ser: 0.74 mg/dL (ref 0.61–1.24)
GFR, Estimated: >60 mL/min
Glucose, Bld: 122 mg/dL — ABNORMAL HIGH (ref 70–99)
Phosphorus: 4.7 mg/dL — ABNORMAL HIGH (ref 2.5–4.6)
Potassium: 4.9 mmol/L (ref 3.5–5.1)
Sodium: 143 mmol/L (ref 135–145)

## 2024-12-13 LAB — BASIC METABOLIC PANEL WITH GFR
Anion gap: 9 (ref 5–15)
BUN: 48 mg/dL — ABNORMAL HIGH (ref 6–20)
CO2: 26 mmol/L (ref 22–32)
Calcium: 8.8 mg/dL — ABNORMAL LOW (ref 8.9–10.3)
Chloride: 107 mmol/L (ref 98–111)
Creatinine, Ser: 0.87 mg/dL (ref 0.61–1.24)
GFR, Estimated: >60 mL/min
Glucose, Bld: 155 mg/dL — ABNORMAL HIGH (ref 70–99)
Potassium: 5.1 mmol/L (ref 3.5–5.1)
Sodium: 142 mmol/L (ref 135–145)

## 2024-12-13 LAB — GLUCOSE, CAPILLARY
Glucose-Capillary: 103 mg/dL — ABNORMAL HIGH (ref 70–99)
Glucose-Capillary: 122 mg/dL — ABNORMAL HIGH (ref 70–99)
Glucose-Capillary: 138 mg/dL — ABNORMAL HIGH (ref 70–99)
Glucose-Capillary: 138 mg/dL — ABNORMAL HIGH (ref 70–99)
Glucose-Capillary: 140 mg/dL — ABNORMAL HIGH (ref 70–99)
Glucose-Capillary: 156 mg/dL — ABNORMAL HIGH (ref 70–99)

## 2024-12-13 LAB — COOXEMETRY PANEL
Carboxyhemoglobin: 1.9 % — ABNORMAL HIGH (ref 0.5–1.5)
Carboxyhemoglobin: 1.5 % (ref 0.5–1.5)
Carboxyhemoglobin: 2 % — ABNORMAL HIGH (ref 0.5–1.5)
Carboxyhemoglobin: 2.3 % — ABNORMAL HIGH (ref 0.5–1.5)
Methemoglobin: 2.3 % — ABNORMAL HIGH (ref 0.0–1.5)
Methemoglobin: <0.7 % (ref 0.0–1.5)
Methemoglobin: 0.8 % (ref 0.0–1.5)
Methemoglobin: <0.7 % (ref 0.0–1.5)
O2 Saturation: 78.4 %
O2 Saturation: 99.8 %
O2 Saturation: 83.6 %
O2 Saturation: 74.4 %
Total hemoglobin: 10.5 g/dL — ABNORMAL LOW (ref 12.0–16.0)
Total hemoglobin: 9.5 g/dL — ABNORMAL LOW (ref 12.0–16.0)
Total hemoglobin: 9.3 g/dL — ABNORMAL LOW (ref 12.0–16.0)
Total hemoglobin: 9.4 g/dL — ABNORMAL LOW (ref 12.0–16.0)

## 2024-12-13 LAB — MAGNESIUM: Magnesium: 3.1 mg/dL — ABNORMAL HIGH (ref 1.7–2.4)

## 2024-12-13 LAB — LACTATE DEHYDROGENASE: LDH: 962 U/L — ABNORMAL HIGH (ref 105–235)

## 2024-12-13 LAB — VANCOMYCIN, RANDOM: Vancomycin Rm: 20 ug/mL

## 2024-12-13 MED ORDER — TRACE MINERALS CU-MN-SE-ZN 300-55-60-3000 MCG/ML IV SOLN
INTRAVENOUS | Status: DC
  Filled 2024-12-13: qty 875.52

## 2024-12-13 MED ORDER — DOBUTAMINE-DEXTROSE 4-5 MG/ML-% IV SOLN
2.5000 ug/kg/min | INTRAVENOUS | Status: AC
  Administered 2024-12-13 – 2024-12-17 (×2): 2.5 ug/kg/min via INTRAVENOUS
  Filled 2024-12-13 (×2): qty 250

## 2024-12-13 MED ORDER — MIDAZOLAM HCL (PF) 2 MG/2ML IJ SOLN: 2.0000 mg | Freq: Once | INTRAMUSCULAR | Status: AC

## 2024-12-13 MED ORDER — FENTANYL CITRATE (PF) 50 MCG/ML IJ SOSY
50.0000 ug | PREFILLED_SYRINGE | INTRAMUSCULAR | Status: AC | PRN
  Administered 2024-12-16 (×3): 50 ug via INTRAVENOUS
  Filled 2024-12-13 (×3): qty 1

## 2024-12-13 MED ORDER — FENTANYL CITRATE (PF) 50 MCG/ML IJ SOSY: 50.0000 ug | PREFILLED_SYRINGE | INTRAMUSCULAR | Status: DC | PRN

## 2024-12-13 MED ORDER — ORAL CARE MOUTH RINSE
15.0000 mL | OROMUCOSAL | Status: DC
  Administered 2024-12-13 – 2024-12-28 (×175): 15 mL via OROMUCOSAL

## 2024-12-13 MED ORDER — GERHARDT'S BUTT CREAM
TOPICAL_CREAM | Freq: Three times a day (TID) | CUTANEOUS | Status: DC
  Administered 2024-12-14 – 2024-12-27 (×10): 1 via TOPICAL
  Filled 2024-12-13: qty 60

## 2024-12-13 MED ORDER — EPINEPHRINE HCL 5 MG/250ML IV SOLN IN NS: 4.0000 ug/min | INTRAVENOUS | Status: DC

## 2024-12-13 MED ORDER — EPINEPHRINE HCL 5 MG/250ML IV SOLN IN NS
0.5000 ug/min | INTRAVENOUS | Status: DC
  Administered 2024-12-13: 4 ug/min via INTRAVENOUS
  Filled 2024-12-13: qty 250

## 2024-12-13 MED ORDER — ROCURONIUM BROMIDE 10 MG/ML (PF) SYRINGE: 50.0000 mg | PREFILLED_SYRINGE | Freq: Once | INTRAVENOUS | Status: AC

## 2024-12-13 MED ORDER — MIDAZOLAM HCL (PF) 2 MG/2ML IJ SOLN
1.0000 mg | INTRAMUSCULAR | Status: DC | PRN
  Administered 2024-12-13 (×3): 2 mg via INTRAVENOUS
  Filled 2024-12-13 (×6): qty 2

## 2024-12-13 MED ORDER — FAMOTIDINE 20 MG PO TABS
20.0000 mg | ORAL_TABLET | Freq: Two times a day (BID) | ORAL | Status: DC
  Administered 2024-12-13 – 2024-12-24 (×22): 20 mg
  Filled 2024-12-13 (×15): qty 1

## 2024-12-13 MED ORDER — PHENYLEPHRINE 80 MCG/ML (10ML) SYRINGE FOR IV PUSH (FOR BLOOD PRESSURE SUPPORT): 80.0000 ug | PREFILLED_SYRINGE | Freq: Once | INTRAVENOUS | Status: AC | PRN

## 2024-12-13 MED ORDER — ORAL CARE MOUTH RINSE: 15.0000 mL | OROMUCOSAL | Status: DC | PRN

## 2024-12-13 MED ORDER — DOBUTAMINE-DEXTROSE 4-5 MG/ML-% IV SOLN: 2.5000 ug/kg/min | INTRAVENOUS | Status: DC

## 2024-12-13 MED ORDER — ETOMIDATE 2 MG/ML IV SOLN: 20.0000 mg | Freq: Once | INTRAVENOUS | Status: AC

## 2024-12-13 MED ORDER — MIDAZOLAM HCL 2 MG/2ML IJ SOLN
INTRAMUSCULAR | Status: AC
  Administered 2024-12-13: 2 mg via INTRAVENOUS
  Filled 2024-12-13: qty 2

## 2024-12-13 MED ORDER — ROCURONIUM BROMIDE 10 MG/ML (PF) SYRINGE
PREFILLED_SYRINGE | INTRAVENOUS | Status: AC
  Filled 2024-12-13: qty 10

## 2024-12-13 MED ORDER — VANCOMYCIN VARIABLE DOSE PER UNSTABLE RENAL FUNCTION (PHARMACIST DOSING): Status: DC

## 2024-12-13 MED ORDER — FENTANYL CITRATE (PF) 50 MCG/ML IJ SOSY
PREFILLED_SYRINGE | INTRAMUSCULAR | Status: AC
  Filled 2024-12-13: qty 2

## 2024-12-13 MED ORDER — TRACE MINERALS CU-MN-SE-ZN 300-55-60-3000 MCG/ML IV SOLN
INTRAVENOUS | Status: AC
  Filled 2024-12-13: qty 875.53

## 2024-12-13 MED ORDER — PHENYLEPHRINE 80 MCG/ML (10ML) SYRINGE FOR IV PUSH (FOR BLOOD PRESSURE SUPPORT)
PREFILLED_SYRINGE | INTRAVENOUS | Status: AC
  Administered 2024-12-13: 9 ug via INTRAVENOUS
  Filled 2024-12-13: qty 10

## 2024-12-13 MED ORDER — VANCOMYCIN HCL 1500 MG/300ML IV SOLN
1500.0000 mg | INTRAVENOUS | Status: DC
  Administered 2024-12-13: 1500 mg via INTRAVENOUS
  Filled 2024-12-13 (×2): qty 300

## 2024-12-13 MED ORDER — FENTANYL CITRATE (PF) 50 MCG/ML IJ SOSY
50.0000 ug | PREFILLED_SYRINGE | INTRAMUSCULAR | Status: DC | PRN
  Administered 2024-12-13 – 2024-12-14 (×3): 100 ug via INTRAVENOUS
  Administered 2024-12-14 – 2024-12-15 (×3): 150 ug via INTRAVENOUS
  Filled 2024-12-13 (×2): qty 2
  Filled 2024-12-13 (×3): qty 3
  Filled 2024-12-13 (×3): qty 2

## 2024-12-13 MED ORDER — ETOMIDATE 2 MG/ML IV SOLN
INTRAVENOUS | Status: AC
  Administered 2024-12-13: 20 mg via INTRAVENOUS
  Filled 2024-12-13: qty 20

## 2024-12-13 MED ORDER — ACETAMINOPHEN 160 MG/5ML PO SOLN
650.0000 mg | Freq: Four times a day (QID) | ORAL | Status: DC | PRN
  Administered 2024-12-15 – 2024-12-17 (×3): 650 mg
  Filled 2024-12-13 (×5): qty 20.3

## 2024-12-13 NOTE — Procedures (Signed)
 Intubation Procedure Note  SIM CHOQUETTE  [id number]  Jan 22, 1981  Date:12/13/2024  Time:6:23 AM   Provider Performing:Jenna Routzahn Maree    Procedure: Intubation (31500)  Indication(s) Respiratory Failure  Consent Risks of the procedure as well as the alternatives and risks of each were explained to the patient and/or caregiver.  Consent for the procedure was obtained and is signed in the bedside chart   Anesthesia Etomidate , Versed , and Rocuronium    Time Out Verified patient identification, verified procedure, site/side was marked, verified correct patient position, special equipment/implants available, medications/allergies/relevant history reviewed, required imaging and test results available.   Sterile Technique Usual hand hygeine, masks, and gloves were used   Procedure Description Patient positioned in bed supine.  Sedation given as noted above.  Patient was intubated with endotracheal tube using Glidescope.  View was Grade 1 full glottis , was slightly anterior.  Number of attempts was 2.  Colorimetric CO2 detector was consistent with tracheal placement.   Complications/Tolerance Hypotension, treated with Neosynephrine Chest X-ray is ordered to verify placement.   EBL none   Specimen(s) None

## 2024-12-13 NOTE — Progress Notes (Signed)
 PHARMACY - TOTAL PARENTERAL NUTRITION CONSULT NOTE   Indication: massive bowel resection   Patient Measurements: Height: 5' 10 (177.8 cm) Weight: 76.2 kg (167 lb 15.9 oz) IBW/kg (Calculated) : 73 TPN AdjBW (KG): 69.9 Body mass index is 24.1 kg/m. Usual Weight: weight 79kg on admission, unclear baseline.   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 Patient underwent an aortic and mitral valve replacement on 12/8 with insertion of Impella. Post-op was found to have continuing HgB drop with noted abdominal pain. Was found to have hemorrhage from intercostal, chest tube and embolization was completed. Workup for dropping HgB included CT angio of chest/ab/and pelvis showed pneumatosis and concern for bowel ischemia. Patient went to OR on 12/12 for ex-lap found to have ischemia of distal ileum over 75cm without perforation. Patient was left in discontinuity with plans to return to OR.  Patient with diet throughout admission however noted nausea and abdominal pain during peri-op time period. Pharmacy consulted to manage TPN.   12/21 Did not tolerate tube feeds, vomiting. Held tube feeds.   Glucose / Insulin : no hx DM, A1c 6.1% - CBGs < 180 Used 6 units sSSI in the past 24 hrs Electrolytes: Na/CL 142/107, CoCa 10.4, Phos spiked 7.7 (did not receive any replacement outside of TPN), K spiked 5.5  (received 40 meq yesterday outside of TPN), mg spiked 3.1 (received 4g IV + 2g IV per primary team outside the TPN)  Renal: SCr 0.60 (BL SCr ~ 0.7), BUN down 29.  12/15 Lasix  60 mg IV x 2  12/16-12/19 Lasix  80 mg IV x BID 12/20 Lasix  80 mg IV once in AM  12/21 no diuresis Hepatic: AST/ALT mildly elevated, tbili down 3.2 (no jaundice), alk phos wnl, TG 212, albumin  1.9 *elevation not due to TPN Intake / Output; MIVF: UOP 1.6 ml/kg/hr, NG , chest tube 140 mL, LBM 12/20, net -10.9L GI  Imaging: 12/11 CT: pneumatosis of small bowel with gas, concerning for bowel ischemia  GI Surgeries / Procedures:  12/11 ex-lap with distal ileum with ischemia (75cm), left in discontinuity with plans to return to OR on Sunday to evaluate for further ischemia   12/14 ex lap, placement of wound vac, right colectomy due to necrosis, abdomen left open  12/16 small bowel resection, end ileostomy, closure   Central access: PICC placed 12/04/24 TPN start date: 12/04/24  Nutritional Goals:  Goal concentrated TPN rate with lipids is 72 mL/hr (76g/L AA, 38g/L ILE, and 16.5% CHO will provide 131g AA, 285g CHO, 64g ILE and 2134 kcals per day)  RD Estimated Needs Total Energy Estimated Needs: 2200-2400kcals Total Protein Estimated Needs: 125-150 g Total Fluid Estimated Needs: 1.8 L  Current Nutrition:  TPN   Plan:  Concentrate TPN as fluid resuscitation is completed receiving diuresis  Continue TPN with lipids at goal of 72 ml/hr.  Electrolytes in TPN: Na 0 mEq/L, decr K 50 mEq/L, Ca 36mEq/L, Mg 15mEq/L, decr Phos 10mmol/L, max CL (ratio is 1:2.27, required for TPN compatibility) Add standard MVI and trace elements to TPN Continue sSSI Q4H  Monitor TPN labs on Mon/Thurs - labs in AM Tolerating tube feeds, will continue at same rate today per primary team    Alejandro Lopez, PharmD

## 2024-12-13 NOTE — Procedures (Signed)
 Central Venous Catheter Insertion Procedure Note  Alejandro Lopez  [id number]  02/28/1981  Date:12/13/2024  Time:3:39 PM   Provider Performing:Alejandro Lopez   Procedure: Insertion of Non-tunneled Central Venous Catheter(36556) with US  guidance (23062)   Indication(s) Medication administration  Consent Risks of the procedure as well as the alternatives and risks of each were explained to the patient and/or caregiver.  Consent for the procedure was obtained and is signed in the bedside chart  Anesthesia Topical only with 1% lidocaine    Timeout Verified patient identification, verified procedure, site/side was marked, verified correct patient position, special equipment/implants available, medications/allergies/relevant history reviewed, required imaging and test results available.  Sterile Technique Maximal sterile technique including full sterile barrier drape, hand hygiene, sterile gown, sterile gloves, mask, hair covering, sterile ultrasound probe cover (if used).  Procedure Description Area of catheter insertion was cleaned with chlorhexidine  and draped in sterile fashion.  With real-time ultrasound guidance a central venous catheter was placed into the right internal jugular vein. Nonpulsatile blood flow and easy flushing noted in all ports.  The catheter was sutured in place and sterile dressing applied.  Complications/Tolerance None; patient tolerated the procedure well. Chest X-ray is ordered to verify placement for internal jugular or subclavian cannulation.   Chest x-ray is not ordered for femoral cannulation.  EBL Minimal  Specimen(s) None

## 2024-12-13 NOTE — Progress Notes (Signed)
 ANTICOAGULATION CONSULT NOTE  Pharmacy Consult for heparin  Indication: bA/MVR  Allergies[1]  Patient Measurements: Height: 5' 10 (177.8 cm) Weight: 76.2 kg (167 lb 15.9 oz) IBW/kg (Calculated) : 73 Heparin  Dosing Weight: 70 kg   Vital Signs: Temp: 100.8 F (38.2 C) (12/21 1800) BP: 105/77 (12/21 1800) Pulse Rate: 88 (12/21 1800)  Labs: Recent Labs    12/12/24 0358 12/12/24 1112 12/12/24 1436 12/12/24 1445 12/12/24 2158 12/13/24 0408 12/13/24 0520 12/13/24 0647 12/13/24 0847 12/13/24 1058 12/13/24 1543 12/13/24 1807 12/13/24 1841  HGB 10.4*  --  10.9*   < >  --  9.9*   < > 10.2* 9.5*  --   --  9.2*  --   HCT 30.5*  --  33.1*   < >  --  30.3*   < > 30.0* 28.0*  --   --  27.0*  --   PLT 251  --  260  --   --  283  --   --   --   --   --   --   --   HEPARINUNFRC 0.14*   < >  --   --  0.23* 0.23*  --   --   --   --   --   --  0.32  CREATININE 0.60*  --  0.50*  --   --  0.98  --   --   --  0.87 0.74  --   --    < > = values in this interval not displayed.    Estimated Creatinine Clearance: 120.4 mL/min (by C-G formula based on SCr of 0.74 mg/dL).  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  Postop course complicated by ischemic bowel s/p exlap 12/11 with partial small bowel resection, abdomen left open.  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   Per Dr. Lucas, plan for 3 months of warfarin therapy with dual valve replacement. Not on anticoagulation prior to admission.  Pharmacy consulted for heparin  dosing.  Impella 5.5 remains in place. Fixed heparin  500 units/h restarted postop 12/17, then heparin  increased 12/19 to low range therapeutic goal with slow titration.  Heparin  level 0.32 at goal on heparin  drip 1400 uts/hr. CBC stable. No  bleeding issues.  Goal of Therapy:  HL 0.3-0.5 Monitor platelets by anticoagulation protocol: Yes   Plan:  Continue heparin  infusion 1400 units/h Daily CBC  and heparin  level Na bicarb purge for Impella     [employer] Pharm.D. CPP, BCPS Clinical Pharmacist [phone] 12/13/2024 7:06 PM   Please check AMION for all [facility] Pharmacy numbers 12/13/2024           [1] No Known Allergies

## 2024-12-13 NOTE — Progress Notes (Signed)
" °   12/13/24 0643  Adult Ventilator Settings  Vt Set (S)  580 mL  Set Rate (S)  24 bmp  FiO2 (%) (S)  80 %  Adult Ventilator Measurements  SpO2 98 %   Increased TV and RR per abg results and weaned oxygen  "

## 2024-12-13 NOTE — Progress Notes (Signed)
 ANTICOAGULATION CONSULT NOTE  Pharmacy Consult for heparin  Indication: bA/MVR  Allergies[1]  Patient Measurements: Height: 5' 10 (177.8 cm) Weight: 76.2 kg (167 lb 15.9 oz) IBW/kg (Calculated) : 73 Heparin  Dosing Weight: 70 kg   Vital Signs: Temp: 98.8 F (37.1 C) (12/21 0700) Temp Source: Axillary (12/20 2315) Pulse Rate: 111 (12/21 0700)  Labs: Recent Labs    12/12/24 0358 12/12/24 1112 12/12/24 1436 12/12/24 1445 12/12/24 2158 12/13/24 0408 12/13/24 0520 12/13/24 0647  HGB 10.4*  --  10.9*   < >  --  9.9* 10.5* 10.2*  HCT 30.5*  --  33.1*   < >  --  30.3* 31.0* 30.0*  PLT 251  --  260  --   --  283  --   --   HEPARINUNFRC 0.14* 0.20*  --   --  0.23* 0.23*  --   --   CREATININE 0.60*  --  0.50*  --   --  0.98  --   --    < > = values in this interval not displayed.    Estimated Creatinine Clearance: 98.3 mL/min (by C-G formula based on SCr of 0.98 mg/dL).  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  Postop course complicated by ischemic bowel s/p exlap 12/11 with partial small bowel resection, abdomen left open.  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   Per Dr. Lucas, plan for 3 months of warfarin therapy with dual valve replacement. Not on anticoagulation prior to admission.  Pharmacy consulted for heparin  dosing.  Impella 5.5 remains in place. Fixed heparin  500 units/h restarted postop 12/17, then heparin  increased 12/19 to low range therapeutic goal with slow titration.  Heparin  level remains low at 0.23 but trending up. CBC stable. No bleeding issues.  Goal of Therapy:  HL 0.3-0.5 Monitor platelets by anticoagulation protocol: Yes   Plan:  Increase heparin  infusion to 1400 units/h Recheck heparin  level in 8h Na bicarb  purge for Impella  Ozell Jamaica, PharmD, BCPS, [facility] Clinical Pharmacist [phone] Please check AMION for all [facility] Pharmacy numbers 12/13/2024          [1] No Known Allergies

## 2024-12-13 NOTE — Progress Notes (Signed)
 "  NAME:  Alejandro Lopez, MRN:  [id number], DOB:  [birth year], LOS: 20 ADMISSION DATE:  11/22/2024, CONSULTATION DATE:  12/8 REFERRING MD:  Lucas, CHIEF COMPLAINT:  post cardiac surgery critical care services    History of Present Illness:  43 year old male patient with history of hypertension, alcohol and tobacco abuse, presented to the emergency room on 11/30 with approximately 1 month history of progressive dyspnea accompanied by lower extremity swelling extending up to the level of his scrotum and abdomen. Diagnostic evaluation by echocardiogram showed left ventricular ejection fraction 35 to 40% with grade 3 diastolic dysfunction this was further complicated by severe mitral valve regurgitation and aortic insufficiency because of this he was transferred to [facility] for further evaluation. He underwent cardiac catheterization on 12/4: Right heart hemodynamic parameters showed baseline right atrial pressure 5 mmHg, PA pressure 32/17, pulmonary capillary wedge pressure at 14 estimated Fick 4.9 L/min with cardiac index Fick calculated at 2.59 His PAPi was 3 Left heart cath was negative for coronary artery disease Went to OR 12/8 for MVR and AVR w/ impella insertion. PCCM asked to assist w/ post op care   OR course EBL: 1735 Received  Products: cryo 92ml, FFP 401 Also received DDAVP , 2200 crystalloid, 250ml albumin   Cell saver 1125 Pump time 4hrs Clamp time 2hrs 50 min  Events: multiple defibrillations intra-op  Intra-op ECHO EF estimated 40%  Pertinent  Medical History  Tobacco abuse, alcohol abuse, hypertension  Significant Hospital Events: Including procedures, antibiotic start and stop dates in addition to other pertinent events   11/30 admitted/. ECHO 35 to 40% with grade 3 diastolic dysfunction this was further complicated by severe mitral valve regurgitation and aortic insufficiency 12/4 left and right heart cath 12/8 AVR and MVR w/ bioprosthetic valves. Arrived on icu  w/ Impella 5.5 MCS at flow 4 lpm and P 7. Received 2 more PLTs, 2 FFP and 1 cryo in first 6 hrs post op for cont blood loss/oozing. Required NE, epi and milrinone . Good flow on IMPELLA but minimal pulsatility  at P7. Placed on Amio gtt for VT 12/9 received 1 unit PRBC over night for hgb down to 7.5, hgb 8 getting second unit of blood. Still on P7 3.5 lPM. Extubated. 12/11 1L thora, later hemorrhagic shock and hemothorax> IR embolization. Also found to have ischemic bowel and underwent ex-lap with partial resection of ileum. 12/15 plan for ostomy tomorrow, transition epi to levophed   12/16 plan back to OR for ostomy placement  12/17 hemodynamically stable off pressors on Milrinone  0.25 , unable to tolerate SBT, plan for CT chest and heparin  gtt, high fevers>micafungin  12/19 extubated 12/20-12/21 abx to meropenem , aspiration/ agitation/ weakness leading to CO2 retention, RV dysfunction and reintubation   Interim History / Subjective:  As above  Objective    Blood pressure (!) 125/102, pulse (!) 111, temperature 98.8 F (37.1 C), resp. rate (!) 24, height 5' 10 (1.778 m), weight 76.2 kg, SpO2 98%. PAP: (40-72)/(18-37) 69/31 CVP:  [4 mmHg-20 mmHg] 13 mmHg CO:  [5.6 L/min-6.6 L/min] 5.6 L/min CI:  [2.78 L/min/m2-3.29 L/min/m2] 2.78 L/min/m2  Vent Mode: PRVC FiO2 (%):  [50 %-100 %] 80 % Set Rate:  [18 bmp-24 bmp] 24 bmp Vt Set:  [540 mL-580 mL] 580 mL PEEP:  [8 cmH20] 8 cmH20 Plateau Pressure:  [28 cmH20] 28 cmH20   Intake/Output Summary (Last 24 hours) at 12/13/2024 0752 Last data filed at 12/13/2024 0700 Gross per 24 hour  Intake 5306.67 ml  Output 3615 ml  Net 1691.67 ml   Filed Weights   12/10/24 0500 12/11/24 0500 12/12/24 0500  Weight: 81.3 kg 80 kg 76.2 kg   No distress Was moving to command before PRN sedation earlier Abd mildly distended, absent BS, minimal UOP Lungs rhonci Ext warm Impella site looks okay  Patient Lines/Drains/Airways Status     Active  Line/Drains/Airways     Name Placement date Placement time Site Days   Arterial Line 12/06/24 Right Radial 12/06/24  --  Radial  7   Peripheral IV 11/26/24 22 G 1.75 Anterior;Right Forearm 11/26/24  1757  Forearm  17   PICC Triple Lumen 12/04/24 Right Brachial 40 cm 0 cm 12/04/24  1623  -- 9   Impella 11/30/24  1409  -- 13   Chest Tube 1 Right Pleural 28 Fr. 12/10/24  1951  Pleural  3   Ileostomy Standard (end) RLQ 12/08/24  1352  RLQ  5   Urethral Catheter Jeronimo Cruz Reyes RN Temperature probe 12/03/24  2345  Temperature probe  10   Airway 7.5 mm 12/13/24  0613  -- less than 1   Small Bore Feeding Tube 10 Fr. Left nare Marking at nare/corner of mouth 65 cm 12/11/24  1007  Left nare  2   Wound 11/30/24 1512 Surgical Closed Surgical Incision Chest Other (Comment) 11/30/24  1512  Chest  13   Wound 11/30/24 1512 Surgical Closed Surgical Incision Chest Right 11/30/24  1512  Chest  13   Wound 12/05/24 1000 Pressure Injury Buttocks Mid Deep Tissue Pressure Injury - Purple or maroon localized area of discolored intact skin or blood-filled blister due to damage of underlying soft tissue from pressure and/or shear. 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 VT; frequent ectopy R pleural effusion> hemothorax post thora, now s/p VATS 12/18 Hypoxic respiratory failure- related to volume overload and hemothorax, extubated 12/19; reintubated 12/21 due to ileus, aspiration, weakness and recurrent hypercarbia Postop large pericardial  effusion- without clear tamponade physiology and unable to drain as appeared fibrinous Fevers, distributive shock- antifungal coverage added to vanc/zosyn  12/17; fever curve broke somewhat; antifungals Dc'd 12/20 as culture neg Ischemic bowel with pneumatosis; s/p partial ileum resection. 12/14 initial resection, 12/16 relook, closure, ostomy Postop ileus Hyperglycemia; h/o pre-DM. A1c 6.1 H/o tobacco abuse H/o asthma vs COPD Deconditioned state Anxiety/ICU delirium/possible delayed etoh w/d- latter questionable but had been on prop/benzos until yesterday then more tremulous/anxious.  However this could have been micro and overt aspiration; problem has been he has always minimized symptoms so teasing out PAD has been difficult.  On a low dose phenobarb taper.  Co-rounded with TCTS:  - Recheck ABG, start walking down epi (ordered for presumed RV dysfunction in setting of reduced function with hypercarbia evidenced by rising CVP but not low SvO2) - Replaced cortrak with NGT, place to LIS - Abx changed from zosyn  to meropenem  yesterday after GNR growing in BAL; vanc duration TBD - Continue milrinone  for now, noon BMP  and round with AHF to figure out if need to push diuresis again - Going to switch out old CVL to other side - Likely postpyloric tube tomorrow and leave OGT for venting - Looks like may need trach but would be interested to see how he does extubated with no impella and a working gut; this will be a multidisciplinary discussion tomorrow - Amio, heparin  gtts as ordered   sodium chloride  20 mL/hr at 12/11/24 0312   acetaminophen  Stopped (12/13/24 0528)   amiodarone  30 mg/hr (12/13/24 0700)   dexmedetomidine  (PRECEDEX ) IV infusion 0.4 mcg/kg/hr (12/13/24 0700)   epinephrine  4 mcg/min (12/13/24 0700)   heparin  1,300 Units/hr (12/13/24 0700)   meropenem  (MERREM ) IV Stopped (12/13/24 0545)   milrinone  0.25 mcg/kg/min (12/13/24 0700)   norepinephrine  (LEVOPHED ) Adult infusion 2 mcg/min  (12/13/24 0700)   sodium bicarbonate  25 mEq (Impella PURGE) in dextrose  5 % 1000 mL bag     TPN ADULT (ION) 72 mL/hr at 12/13/24 0700     Critical care time:  39 minutes      CRITICAL CARE Performed by: Toribio [name]  Critical care time was exclusive of separately billable procedures and treating other patients.  Critical care was necessary to treat or prevent imminent or life-threatening deterioration.  Critical care was time spent personally by me on the following activities: development of treatment plan with patient and/or surrogate as well as nursing, discussions with consultants, evaluation of patient's response to treatment, examination of patient, obtaining history from patient or surrogate, ordering and performing treatments and interventions, ordering and review of laboratory studies, ordering and review of radiographic studies, pulse oximetry and re-evaluation of patient's condition.    "

## 2024-12-13 NOTE — Progress Notes (Signed)
 TCTS PM Rounding Progress Note  Aflutter this afternoon so epi down to 2, back in NSR  Co-ox 74 this evening on epi 2, dobutamine  2.5 Ostomy output increasing Impella at P5, flowing 2.9 L UOP, Cre improving  Vitals:   12/13/24 1700 12/13/24 1701  BP: 93/75 93/75  Pulse: 87 87  Resp: (!) 29 (!) 30  Temp: (!) 100.6 F (38.1 C) (!) 100.6 F (38.1 C)  SpO2: 100% 100%    Plan: - Keep inotropes as is - Continue current care  Con Clunes, MD Cardiothoracic Surgery Pager: [phone]

## 2024-12-13 NOTE — Progress Notes (Addendum)
 3 Days Post-Op Procedures (LRB): VIDEO ASSISTED THORACOSCOPY (VATS)/DECORTICATION (Right) Subjective: Hypercarbic on ABG this AM with increasing CVP and pressor requirement -> reintubated, Impella to P7, Epi started at 4 NGT placed with 1.4 L bilious output  Objective: Vital signs in last 24 hours: Temp:  [98.4 F (36.9 C)-104.5 F (40.3 C)] 98.8 F (37.1 C) (12/21 0700) Pulse Rate:  [73-127] 111 (12/21 0700) Cardiac Rhythm: Sinus tachycardia (12/20 2000) Resp:  [0-67] 24 (12/21 0700) BP: (107-130)/(84-103) 125/102 (12/20 1530) SpO2:  [76 %-100 %] 98 % (12/21 0700) Arterial Line BP: (66-158)/(51-92) 111/72 (12/21 0700) FiO2 (%):  [50 %-100 %] 80 % (12/21 0747)  Hemodynamic parameters for last 24 hours: PAP: (40-72)/(18-37) 69/31 CVP:  [4 mmHg-20 mmHg] 13 mmHg CO:  [5.6 L/min-6.5 L/min] 5.6 L/min CI:  [2.78 L/min/m2-3.27 L/min/m2] 2.78 L/min/m2  Intake/Output from previous day: 12/20 0701 - 12/21 0700 In: 5306.7 [I.V.:2902.1; NG/GT:520; IV Piggyback:1590] Out: 3615 [Urine:2875; Emesis/NG output:500; Stool:100; Chest Tube:140] Intake/Output this shift: No intake/output data recorded.  General appearance: Intubated, sedated Heart: Afib low 100s Lungs: Undergoing chest PT, intubated, chest tube in place with small amount of dark blood Abdomen: Ostomy in place, liquid stool output Extremities: improved edema  Wound: sternal intact  Lab Results: Recent Labs    12/12/24 1436 12/12/24 1445 12/13/24 0408 12/13/24 0520 12/13/24 0647  WBC 21.0*  --  22.8*  --   --   HGB 10.9*   < > 9.9* 10.5* 10.2*  HCT 33.1*   < > 30.3* 31.0* 30.0*  PLT 260  --  283  --   --    < > = values in this interval not displayed.   BMET:  Recent Labs    12/12/24 1436 12/12/24 1445 12/13/24 0408 12/13/24 0520 12/13/24 0647  NA 145   < > 142 142 141  K 4.3   < > 5.5* 5.6* 5.5*  CL 108  --  106  --   --   CO2 27  --  29  --   --   GLUCOSE 134*  --  156*  --   --   BUN 27*  --  44*  --    --   CREATININE 0.50*  --  0.98  --   --   CALCIUM  9.4  --  9.1  --   --    < > = values in this interval not displayed.    PT/INR:  No results for input(s): LABPROT, INR in the last 72 hours.  ABG    Component Value Date/Time   PHART 7.145 (LL) 12/13/2024 0647   HCO3 29.1 (H) 12/13/2024 0647   TCO2 32 12/13/2024 0647   ACIDBASEDEF 1.0 12/13/2024 0647   O2SAT 99 12/13/2024 0647   CBG (last 3)  Recent Labs    12/12/24 2313 12/13/24 0330 12/13/24 0716  GLUCAP 146* 140* 156*    Assessment/Plan: S/P Procedures (LRB): VIDEO ASSISTED THORACOSCOPY (VATS)/DECORTICATION (Right) 12/8 AVR/MVR impella 12/16 Abdominal closure and end ileostomy 12/18 R VATS chest washout  NEURO- Neuro intact, now sedated afterintubation CV- in sinus tachy  Impella p7, 4.0 LPM  Epi started at 4 for RV support in the setting of hypercarbia, milrinone  at 0.25 -> Will wean Epi back down this afternoon as CO2 corrects  Known moderate pericardial effusion - Will continue to monitor and try to wean off support without drainage. Plan to switch out central line  RESP- Reintubated on 12/21,likely due to weakness, ileus, possibly aspiration  Keep intubated  for today, discussed need for possible trach with patient's wife this morning.  Follow-up BAL from 12/18 - NGTD RENAL- cre increased from 0.5 to 1.0 this morning, likely due to RV failure in the setting of hypercarbia.  Continue to monitor as respiratory status and heart function improves.  K also elevated but will monitor, hold on lasix  for now.  ENDO- CBG well controlled GI - Abdomen closed and end ileostomy on 12/16  Tube feeds held, continue full dose TPN ID- Broadened to meropenem  on 12/20, afebrile but still with leukocytosis, continue vanc/zosyn , mica on 12/17-12/19 - blood cultures sent on 12/17 - NGTD Remains critically ill  Dispo: ICU   LOS: 20 days    Con RAMAN Seniah Lawrence 12/13/2024

## 2024-12-13 NOTE — Progress Notes (Signed)
 Pharmacy Antibiotic Note  Alejandro Lopez is a 43 y.o. 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 Also s/p exploratory laparotomy and small bowel resection. Pharmacy has been consulted for vancomycin  dosing, also on meropenem . ABX planned to stop today but with worsening respiratory status over the weekend ABX continued. Cr is up, vancomycin  random is 20 mcg/ml ~12h after last dose. Will consolidate to q24h dosing later today.  Plan: Meropenem  1g IV q8h Vancomycin  1500mg  IV q24h tonight  Height: 5' 10 (177.8 cm) Weight: 76.2 kg (167 lb 15.9 oz) IBW/kg (Calculated) : 73  Temp (24hrs), Avg:99.3 F (37.4 C), Min:98.6 F (37 C), Max:104.5 F (40.3 C)  Recent Labs  Lab 12/10/24 0029 12/10/24 0617 12/10/24 1005 12/10/24 1426 12/10/24 2118 12/11/24 0458 12/12/24 0358 12/12/24 1436 12/12/24 1451 12/13/24 0408 12/13/24 1058  WBC  --    < >  --   --  18.7* 17.2* 20.6* 21.0*  --  22.8*  --   CREATININE 0.81   < >  --    < > 0.81 0.74 0.60* 0.50*  --  0.98 0.87  LATICACIDVEN  --   --   --   --   --   --   --   --  0.9  --   --   VANCOTROUGH  --   --  12*  --   --   --   --   --   --   --   --   VANCOPEAK 28*  --   --   --   --   --   --   --   --   --   --    < > = values in this interval not displayed.    Estimated Creatinine Clearance: 110.7 mL/min (by C-G formula based on SCr of 0.87 mg/dL).    Allergies[1]  Antimicrobials this admission: Zosyn  12/12 >> Vancomycin  12/14 >> Micafungin  12/17 >> c  Microbiology results: 12/11 Pleural fluid: ngF 12/11 BCx: ngF 12/7 MSSA PCR: positive (MRSA negative) 12/2 Bcx: neF 12/14 TA: rare GPC, few candida albicans 12/17 Bcx: NG1D 12/18 BAL: sent  Thank you for allowing pharmacy to be a part of this patients care.  Maurilio Fila, PharmD Clinical Pharmacist 12/13/2024  12:13 PM      [1] No Known Allergies

## 2024-12-13 NOTE — Progress Notes (Signed)
" °   12/13/24 0620  Vent Select  $ Ventilator Initial/Subsequent  Initial  Invasive or Noninvasive Invasive  Adult Vent Y  Vent start date 12/13/24  Vent start time 0620  Adult Ventilator Settings  Vent Type Servo i  Humidity HME  Vent Mode PRVC  Vt Set 540 mL  Set Rate 18 bmp  FiO2 (%) 100 %  I Time 0.9 Sec(s)  PEEP 8 cmH20  Adult Ventilator Measurements  Peak Airway Pressure 34 L/min  Mean Airway Pressure 15 cmH20  Resp Rate Spontaneous 0 br/min  Resp Rate Total 18 br/min  Exhaled Vt 534 mL  Measured Ve 10.3 L  I:E Ratio Measured 1:2.7  Auto PEEP 0 cmH20  Total PEEP 5 cmH20  Adult Ventilator Alarms  Alarms On Y  Ve High Alarm 21 L/min  Ve Low Alarm 4 L/min  Resp Rate High Alarm 38 br/min  Resp Rate Low Alarm 8  PEEP Low Alarm 2 cmH2O  Press High Alarm 45 cmH2O  T Apnea 20 sec(s)  VAP Prevention  HOB> 30 Degrees Y  Equipment wiped down Yes  Daily Weaning Assessment  Daily Assessment of Readiness to Wean Wean protocol criteria not met   Pt was extubated with 7.5 Ett tube 26@ lip placed on above settings and abg to follow "

## 2024-12-13 NOTE — Progress Notes (Signed)
 3 Days Post-Op   Subjective/Chief Complaint: Episode of emesis yesterday followed by worsening tachypnea and increased work of breathing. Reintubated this morning around 0600, chest x-ray with interval increase in right basilar opacities. On low-dose epi NG tube placed by bedside nurse this morning, immediately filled up to canister with dark, thick, bilious effluent  Objective: Vital signs in last 24 hours: Temp:  [98.2 F (36.8 C)-104.5 F (40.3 C)] 98.8 F (37.1 C) (12/21 0700) Pulse Rate:  [73-127] 111 (12/21 0700) Resp:  [0-67] 24 (12/21 0700) BP: (107-130)/(84-103) 125/102 (12/20 1530) SpO2:  [76 %-100 %] 98 % (12/21 0700) Arterial Line BP: (66-158)/(51-92) 111/72 (12/21 0700) FiO2 (%):  [50 %-100 %] 80 % (12/21 0747) Last BM Date : 12/12/24  Intake/Output from previous day: 12/20 0701 - 12/21 0700 In: 5306.7 [I.V.:2902.1; NG/GT:520; IV Piggyback:1590] Out: 3615 [Urine:2875; Emesis/NG output:500; Stool:100; Chest Tube:140] Intake/Output this shift: No intake/output data recorded. Alert, cooperative CV - HR low 100's, no extremity edema  Pulm -intubated sedated Ab soft, mild distention, leostomy pink with liquid nonbloody stool in ostomy appliance, vac in place  Ileostomy -100 mL  NG- >800 ml immediate return, no blood  VAC - SS   TF held  Lab Results:  Recent Labs    12/12/24 1436 12/12/24 1445 12/13/24 0408 12/13/24 0520 12/13/24 0647  WBC 21.0*  --  22.8*  --   --   HGB 10.9*   < > 9.9* 10.5* 10.2*  HCT 33.1*   < > 30.3* 31.0* 30.0*  PLT 260  --  283  --   --    < > = values in this interval not displayed.   BMET Recent Labs    12/12/24 1436 12/12/24 1445 12/13/24 0408 12/13/24 0520 12/13/24 0647  NA 145   < > 142 142 141  K 4.3   < > 5.5* 5.6* 5.5*  CL 108  --  106  --   --   CO2 27  --  29  --   --   GLUCOSE 134*  --  156*  --   --   BUN 27*  --  44*  --   --   CREATININE 0.50*  --  0.98  --   --   CALCIUM  9.4  --  9.1  --   --    < > =  values in this interval not displayed.   PT/INR No results for input(s): LABPROT, INR in the last 72 hours. ABG Recent Labs    12/13/24 0520 12/13/24 0647  PHART 7.129* 7.145*  HCO3 30.1* 29.1*    Studies/Results: Portable Chest x-ray Result Date: 12/13/2024 EXAM: 1 VIEW(S) XRAY OF THE CHEST 12/13/2024 07:00:15 AM COMPARISON: 12/12/2024 CLINICAL HISTORY: Endotracheal tube present FINDINGS: LINES, TUBES AND DEVICES: Impella device stable in position. Right PICC with tip in expected region of superior cavoatrial junction. Enteric tube courses below with tip and side port in expected region of gastric lumen. Left internal jugular venous sheath in place. PA catheter removed. Endotracheal tube with tip 3 cm above carina. Stable right chest tube. LUNGS AND PLEURA: Decreased aeration of right lung with increased patchy right lung base opacities. Increased interstitial markings. Small right pleural effusion increased. No pneumothorax. HEART AND MEDIASTINUM: Persistent cardiomegaly. BONES AND SOFT TISSUES: Post median sternotomy. STOMACH AND BOWEL: Gaseous distention of stomach. IMPRESSION: 1. Decreased aeration of the right lung with increased patchy right basilar opacities, increased interstitial markings, and a small right pleural effusion. Electronically signed by: Waddell  Joan MD 12/13/2024 07:09 AM EST RP Workstation: [id number]   DG Chest Port 1 View Result Date: 12/12/2024 CLINICAL DATA:  Shortness of breath. EXAM: PORTABLE CHEST 1 VIEW COMPARISON:  Chest radiograph dated 12/12/2024. FINDINGS: Slight interval advancement of the of Swan-Ganz with tip projecting over the inferior right pulmonary artery. Additional support apparatus in similar position. Shallow inspiration with bibasilar atelectasis. Pneumonia is not excluded small right pleural effusion. No pneumothorax. There is cardiomegaly. No acute osseous pathology. Median sternotomy wires. IMPRESSION: Slight interval advancement of the  Swan-Ganz with tip projecting over the inferior right pulmonary artery. Electronically Signed   By: Vanetta Chou M.D.   On: 12/12/2024 17:29   DG Chest Port 1 View Result Date: 12/12/2024 EXAM: 1 VIEW(S) XRAY OF THE CHEST 12/12/2024 07:46:00 AM COMPARISON: 12/11/2024 CLINICAL HISTORY: SOB (shortness of breath) FINDINGS: LINES, TUBES AND DEVICES: Impella device in place. Left IJ Swan-Ganz catheter in place with tip overlying right pulmonary artery. Right chest tube in place. Enteric tube in place with distal tip beyond inferior margin of film. LUNGS AND PLEURA: Low lung volumes. Mild pulmonary edema. Hazy bibasilar opacities. Right pleural effusion, unchanged. SABRA No pneumothorax. HEART AND MEDIASTINUM: Status post median sternotomy and valve replacement. BONES AND SOFT TISSUES: Status post median sternotomy and valve replacement. IMPRESSION: 1. Unchanged mild pulmonary edema with right pleural effusion and bibasilar atelectasis. 2. Impella device, left IJ Swan-Ganz catheter, right chest tube, and enteric tube in place. Electronically signed by: Waddell Joan MD 12/12/2024 07:53 AM EST RP Workstation: [id number]    Anti-infectives: Anti-infectives (From admission, onward)    Start     Dose/Rate Route Frequency Ordered Stop   12/13/24 0720  vancomycin  variable dose per unstable renal function (pharmacist dosing)         Does not apply See admin instructions 12/13/24 0720     12/12/24 1630  meropenem  (MERREM ) 1 g in sodium chloride  0.9 % 100 mL IVPB        1 g 200 mL/hr over 30 Minutes Intravenous Every 8 hours 12/12/24 1534     12/10/24 1000  micafungin  (MYCAMINE ) 100 mg in sodium chloride  0.9 % 100 mL IVPB  Status:  Discontinued       Placed in Followed by Linked Group   100 mg 105 mL/hr over 1 Hours Intravenous Every 24 hours 12/09/24 0704 12/11/24 0710   12/09/24 0830  micafungin  (MYCAMINE ) 200 mg in sodium chloride  0.9 % 100 mL IVPB       Placed in Followed by Linked Group   200 mg 110  mL/hr over 1 Hours Intravenous  Once 12/09/24 0704 12/09/24 1101   12/06/24 2200  vancomycin  (VANCOREADY) IVPB 1250 mg/250 mL  Status:  Discontinued        1,250 mg 166.7 mL/hr over 90 Minutes Intravenous Every 12 hours 12/06/24 0811 12/13/24 0709   12/06/24 0900  vancomycin  (VANCOREADY) IVPB 1750 mg/350 mL        1,750 mg 175 mL/hr over 120 Minutes Intravenous  Once 12/06/24 0802 12/06/24 1032   12/04/24 1100  vancomycin  (VANCOCIN ) IVPB 1000 mg/200 mL premix  Status:  Discontinued        1,000 mg 200 mL/hr over 60 Minutes Intravenous Every 12 hours 12/03/24 2238 12/03/24 2242   12/04/24 0000  vancomycin  (VANCOREADY) IVPB 1500 mg/300 mL  Status:  Discontinued        1,500 mg 150 mL/hr over 120 Minutes Intravenous  Once 12/03/24 2236 12/03/24 2242   12/03/24 2300  piperacillin -tazobactam (ZOSYN )  IVPB 3.375 g  Status:  Discontinued        3.375 g 12.5 mL/hr over 240 Minutes Intravenous Every 8 hours 12/03/24 2236 12/12/24 1534   11/30/24 2130  vancomycin  (VANCOCIN ) IVPB 1000 mg/200 mL premix        1,000 mg 200 mL/hr over 60 Minutes Intravenous  Once 11/30/24 1609 11/30/24 2351   11/30/24 1615  ceFAZolin  (ANCEF ) IVPB 2g/100 mL premix        2 g 200 mL/hr over 30 Minutes Intravenous Every 8 hours 11/30/24 1609 12/02/24 0546   11/30/24 0400  vancomycin  (VANCOREADY) IVPB 1250 mg/250 mL        1,250 mg 166.7 mL/hr over 90 Minutes Intravenous To Surgery 11/29/24 1040 11/30/24 0856   11/30/24 0400  ceFAZolin  (ANCEF ) IVPB 2g/100 mL premix        2 g 200 mL/hr over 30 Minutes Intravenous To Surgery 11/29/24 1040 11/30/24 1239   11/30/24 0400  ceFAZolin  (ANCEF ) IVPB 2g/100 mL premix  Status:  Discontinued        2 g 200 mL/hr over 30 Minutes Intravenous To Surgery 11/29/24 1036 11/30/24 1609       Assessment/Plan: 43 y/o M S/P AVR, MCR, Impella placement by Dr. Lucas 12/8   S/P right thoracentesis 12/11 with hemorrhage from intercostal -chest tube placed by TCTS.  S/p emergent IR  embolizaton  R intercostal artery by Dr. Philip.   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 Continue to hold TF, NGT to LIWS  FEN: NPO, NG to LIWS, IVF per primary ID: Zosyn , Micafungin , Vanc; resp cx 12/18 NGTD, BCx 12/17 no growth 3 days VTE: heparin  gtt Foley: in place Dispo: ICU   Almarie GORMAN Pringle 12/13/2024

## 2024-12-13 NOTE — Progress Notes (Signed)
 Patient ID: Alejandro Lopez, male   DOB: 1981-10-30, 43 y.o.   MRN: [id number]     Advanced Heart Failure Rounding Note  Cardiologist: None  Chief Complaint: Valvular Heart Disease & HFrEF Patient Profile  43 y.o. male w/ h/o heavy alcohol use (at least 5 drinks per evening) and h/o asthma admitted w/ acute systolic heart failure. Strong family history of cardiac disease: mother and maternal grandmother had heart failure and father with CAD. Admitted with acute HFrEF/cardiogenic shock.   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  12/9 VT/VF ? vagal episode. Extubated 12/11: S/p R thoracentesis with resultant hemothorax.  Chest tube placed. S/p R intercostal artery coil (IR). S/p exp lap for ischemic bowel resection, bowel left in discontinuity 12/15: Back to OR for ischemic R colon and mesenteric ischemia. S/p exp/lap, wound vac exchange, R colectomy without anastomosis.  12/16 Back to OR for small bowel resection and ileostomy  12/18 OR for VATS/hemothorax washout, pericardial window was not done.  12/19 extubated 12/20 Reintubated overnight  Subjective:    Impella 5.5  Flow (Liters/min): 2.8 Liters/min Performance Level: P5 Recent Labs    12/11/24 0458 12/12/24 0358 12/13/24 0408  LDH 829* 922* 962*    Reintubated overnight for hypercarbia. Now sedated on vent. NGT placed with > 1L ouu  Remains on Impella 5.5 back up to P8 this am with good flows and waveforms    On DBA 2.5 Co-ox 74% CVP 8-10  CVP:  [4 mmHg-17 mmHg] 8 mmHg     Objective:   Weight Range: 76.2 kg Body mass index is 24.1 kg/m.   Vital Signs:   Temp:  [98.6 F (37 C)-100.9 F (38.3 C)] 100.9 F (38.3 C) (12/21 1928) Pulse Rate:  [73-127] 89 (12/21 1928) Resp:  [8-48] 24 (12/21 1928) BP: (93-116)/(75-80) 116/80 (12/21 1928) SpO2:  [76 %-100 %] 99 % (12/21 1928) Arterial Line BP: (66-154)/(51-92) 117/68 (12/21 1800) FiO2 (%):  [40 %-100 %] 40 % (12/21  1928) Last BM Date : 12/13/24  Weight change: Filed Weights   12/10/24 0500 12/11/24 0500 12/12/24 0500  Weight: 81.3 kg 80 kg 76.2 kg    Intake/Output:   Intake/Output Summary (Last 24 hours) at 12/13/2024 2031 Last data filed at 12/13/2024 1900 Gross per 24 hour  Intake 4120.39 ml  Output 4080 ml  Net 40.39 ml     Physical Exam   General: intubated sedated HEENT: normal +ETT/NG Neck: supple.  Cor: Regular rate & rhythm. No rubs, gallops or murmurs. Impella 5.5 site ok  Lungs: clear Abdomen: soft, nontender, nondistended.ostomy site pink working well  Extremities: no cyanosis, clubbing, rash, edema Neuro: intubated/sedated   Telemetry   NSR 80-90s Personally reviewed  Labs    CBC Recent Labs    12/12/24 1436 12/12/24 1445 12/13/24 0408 12/13/24 0520 12/13/24 0847 12/13/24 1807  WBC 21.0*  --  22.8*  --   --   --   HGB 10.9*   < > 9.9*   < > 9.5* 9.2*  HCT 33.1*   < > 30.3*   < > 28.0* 27.0*  MCV 90.7  --  93.8  --   --   --   PLT 260  --  283  --   --   --    < > = values in this interval not displayed.   Basic Metabolic Panel Recent Labs    87/79/74 0358 12/12/24 1436 12/13/24 0408 12/13/24 0520 12/13/24 1058  12/13/24 1224 12/13/24 1543 12/13/24 1807  NA 146*   < > 142   < > 142  --  143 144  K 3.9   < > 5.5*   < > 5.1  --  4.9 4.9  CL 109   < > 106  --  107  --  109  --   CO2 29   < > 29  --  26  --  27  --   GLUCOSE 134*   < > 156*  --  155*  --  122*  --   BUN 29*   < > 44*  --  48*  --  45*  --   CREATININE 0.60*   < > 0.98  --  0.87  --  0.74  --   CALCIUM  9.2   < > 9.1  --  8.8*  --  8.9  --   MG 1.9  --  3.1*  --   --   --   --   --   PHOS 3.6  --  7.7*  --   --  5.5* 4.7*  --    < > = values in this interval not displayed.   Liver Function Tests Recent Labs    12/12/24 1436 12/13/24 0408 12/13/24 1543  AST 205* 207*  --   ALT 150* 182*  --   ALKPHOS 115 117  --   BILITOT 2.7* 2.4*  --   PROT 6.6 6.4*  --   ALBUMIN  2.4*  2.2* 2.1*   No results for input(s): LIPASE, AMYLASE in the last 72 hours. Cardiac Enzymes No results for input(s): CKTOTAL, CKMB, CKMBINDEX, TROPONINI in the last 72 hours.  BNP: BNP (last 3 results) No results for input(s): BNP in the last 8760 hours.  ProBNP (last 3 results) Recent Labs    11/22/24 1958  PROBNP 3,354.0*     D-Dimer No results for input(s): DDIMER in the last 72 hours.  Hemoglobin A1C No results for input(s): HGBA1C in the last 72 hours. Fasting Lipid Panel No results for input(s): CHOL, HDL, LDLCALC, TRIG, CHOLHDL, LDLDIRECT in the last 72 hours.  Thyroid Function Tests No results for input(s): TSH, T4TOTAL, T3FREE, THYROIDAB in the last 72 hours.  Invalid input(s): FREET3  Other results:   Imaging    DG CHEST PORT 1 VIEW Result Date: 12/04/2024 CLINICAL DATA:  Ventilator dependence. EXAM: PORTABLE CHEST 1 VIEW COMPARISON:  12/03/2024 FINDINGS: Endotracheal tube tip is approximately 3.8 cm above the base of the carina. The NG tube passes into the stomach although the distal tip position is not included on the film. Right-sided Impella device is stable in position. A left IJ pulmonary artery catheter tip overlies expected location of the interlobar pulmonary artery. Right pleural drain again noted with persistent right pleural fluid. Basilar collapse/consolidation noted, IMPRESSION: 1. No substantial interval change. 2. Persistent right pleural effusion with right base collapse/consolidation. 3. Support apparatus as above. Electronically Signed   By: Camellia Candle M.D.   On: 12/04/2024 07:15   DG Abd 1 View Result Date: 12/04/2024 CLINICAL DATA:   Routine adult health maintenance Best images obtainable due to patient's condition EXAM: ABDOMEN - 1 VIEW COMPARISON:  12/04/2024 FINDINGS: NG tube tip is in the distal stomach. Diffuse gaseous distention of colon evident, similar to prior. IMPRESSION: NG tube tip is in  the distal stomach. Electronically Signed   By: Camellia Candle M.D.   On: 12/04/2024 07:11   CT Angio Chest/Abd/Pel  for Dissection W and/or W/WO Addendum Date: 12/03/2024 ADDENDUM #1 ADDENDUM: ---------------------------------------------------- These results were called to Dr. Portia Wisdom  at 10:15 pm on 12/03/2024. Electronically signed by: Franky Crease MD 12/03/2024 10:23 PM EST RP Workstation: [id number]   Result Date: 12/03/2024 ORIGINAL REPORT EXAM: CT CHEST, ABDOMEN AND PELVIS WITH AND WITHOUT CONTRAST 12/03/2024 09:52:07 PM TECHNIQUE: CT of the chest, abdomen and pelvis was performed with and without the administration of intravenous contrast. Multiplanar reformatted images are provided for review. Automated exposure control, iterative reconstruction, and/or weight based adjustment of the mA/kV was utilized to reduce the radiation dose to as low as reasonably achievable. COMPARISON: None available. CLINICAL HISTORY: Acute aortic syndrome (AAS) suspected; Evaluate for intercostal bleeding or other sources after thoracentesis; also evaluate bowel. FINDINGS: CHEST: MEDIASTINUM AND LYMPH NODES: Mild cardiomegaly. Impella device tip in the left ventricle. Swan-ganz catheter tip in the central right pulmonary artery. No evidence of aortic aneurysm or dissection. The central airways are clear. No mediastinal, hilar or axillary lymphadenopathy. LUNGS AND PLEURA: Right chest tube in place. Moderate to large right pleural effusion with compressive atelectasis in the right middle lobe and right lower lobe. There appears to be active extravasation of contrast posteriorly between the right 9th and 10th ribs and likely into the pleural space, possibly related to prior thoracentesis. The pleural fluid appears complex on the right, likely hemothorax. Small left pleural effusion with compressive atelectasis in the left lower lobe. Tiny right pneumothorax. Biapical subpleural blebs. No evidence of pulmonary embolus. ABDOMEN AND  PELVIS: LIVER: Gas is seen branching peripherally in the liver compatible with portal venous gas. GALLBLADDER AND BILE DUCTS: Gallbladder is unremarkable. No biliary ductal dilatation. SPLEEN: No acute abnormality. PANCREAS: No acute abnormality. ADRENAL GLANDS: No acute abnormality. KIDNEYS, URETERS AND BLADDER: No stones in the kidneys or ureters. No hydronephrosis. No perinephric or periureteral stranding. Urinary bladder is unremarkable. GI AND BOWEL: Stomach and small bowel are decompressed. Diffuse gaseous distention of the colon suggests ileus. Moderate stool burden in the rectosigmoid colon. Pneumatosis noted in small bowel loops in the lower pelvis with air in the mesenteric veins feeding these small bowel loops. Pneumatosis also noted in the right colon wall. REPRODUCTIVE ORGANS: No acute abnormality. PERITONEUM AND RETROPERITONEUM: No ascites. No free air. VASCULATURE: Aorta is normal in caliber. ABDOMINAL AND PELVIS LYMPH NODES: No lymphadenopathy. BONES AND SOFT TISSUES: No acute osseous abnormality. No focal soft tissue abnormality. IMPRESSION: 1. Active contrast extravasation between the right 9th and 10th ribs with likely extension into the pleural space, suspicious for ongoing intercostal bleeding likely related to recent thoracentesis. 2. Moderate to large right pleural effusion compatible with hemothorax with compressive atelectasis in the right middle and lower lobes; right chest tube present; tiny right pneumothorax. 3. No evidence of aortic aneurysm, aortic dissection, or pulmonary embolus. 4. Pneumatosis involving small bowel loops in the lower pelvis and the right colon with mesenteric venous gas and portal venous gas, concerning for bowel ischemia. Recommend surgical consultation. 5. Small left pleural effusion with compressive atelectasis in the left lower lobe. 6. . Attempts are being made to contact the ordering physician with the results. An addendum will be made at that time.  Electronically signed by: Franky Crease MD 12/03/2024 10:09 PM EST RP Workstation: [id number]   DG Chest Port 1 View Result Date: 12/03/2024 EXAM: 1 VIEW(S) XRAY OF THE CHEST 12/03/2024 07:57:00 PM COMPARISON: Portable chest today at 7:02 pm. CLINICAL HISTORY: Encounter for chest tube placement. FINDINGS: LINES, TUBES AND DEVICES: A pigtail  chest tube has been inserted on the right through the 6th intercostal space, with the pigtail in the lateral upper right thorax. Left IJ Swan-Ganz line again terminates in the distal right pulmonary artery. Stable positioning of right subclavian Impella device. LUNGS AND PLEURA: No pneumothorax. Moderate to large right pleural effusion is still present but decreased in the interval. Small left pleural effusion unchanged. The right mid to lower lung is obscured by pleural fluid. There is patchy consolidation in the left lower lung field. Left mid and both apical lungs are clear. HEART AND MEDIASTINUM: Sternotomy sutures and two cardiac valve replacements are again shown. There is mild cardiomegaly and mild central vascular prominence as before. BONES AND SOFT TISSUES: No acute osseous abnormality. IMPRESSION: 1. Right pigtail chest tube in position with no measurable pneumothorax. 2. Moderate to large right pleural effusion, decreased compared to earlier today. 3. Patchy consolidation in the left lower lung field. 4. Small left pleural effusion, unchanged. 5. Cardiomegaly . Electronically signed by: Francis Quam MD 12/03/2024 08:10 PM EST RP Workstation: [id number]   DG CHEST PORT 1 VIEW Result Date: 12/03/2024 EXAM: 1 VIEW(S) XRAY OF THE CHEST 12/03/2024 07:05:00 PM COMPARISON: None available. CLINICAL HISTORY: ABLA (acute blood loss anemia). FINDINGS: LINES, TUBES AND DEVICES: Impella device and Swan-Ganz catheter remain in place, unchanged. LUNGS AND PLEURA: Enlarging right pleural effusion, now large with atelectasis throughout the right lung. Vascular congestion and  left basilar atelectasis. Low lung volumes. No pneumothorax. HEART AND MEDIASTINUM: No acute abnormality of the cardiac and mediastinal silhouettes. BONES AND SOFT TISSUES: No acute osseous abnormality. IMPRESSION: 1. Enlarging right pleural effusion, now large, with associated right lung atelectasis. 2. Vascular congestion with left basilar atelectasis. 3. Low lung volumes. Electronically signed by: Franky Crease MD 12/03/2024 07:10 PM EST RP Workstation: [id number]   DG Chest Port 1 View Result Date: 12/03/2024 CLINICAL DATA:  Pleural effusion, status post thoracentesis EXAM: PORTABLE CHEST 1 VIEW COMPARISON:  12/03/2024 FINDINGS: Single frontal view of the chest demonstrates stable left internal jugular flow directed central venous catheter, tip overlying the right infrahilar region. Stable mitral and aortic valve prostheses. Cardiac silhouette remains enlarged. Lung volumes are diminished, with bibasilar veiling opacities, right greater than left, consistent with consolidation and effusions. No appreciable change since prior study. No evidence of pneumothorax. IMPRESSION: 1. Persistent bibasilar consolidation and effusions, right greater than left. 2. No evidence of pneumothorax. 3. Stable enlarged cardiac silhouette. Electronically Signed   By: Ozell Daring M.D.   On: 12/03/2024 16:10    Medications:   Scheduled Medications:  arformoterol   15 mcg Nebulization BID   Chlorhexidine  Gluconate Cloth  6 each Topical Daily   colchicine   0.6 mg Per Tube Daily   famotidine   20 mg Per Tube BID   feeding supplement (PROSource TF20)  60 mL Per Tube Daily   folic acid   1 mg Per Tube Daily   Gerhardt's butt cream   Topical TID   insulin  aspart  0-9 Units Subcutaneous Q4H   lidocaine   1 patch Transdermal Q24H   mouth rinse  15 mL Mouth Rinse Q2H   pantoprazole  (PROTONIX ) IV  40 mg Intravenous Daily   PHENObarbital   97.5 mg Intravenous Q8H   Followed by   [START ON 12/14/2024] PHENObarbital   65 mg  Intravenous Q8H   Followed by   [START ON 12/16/2024] PHENObarbital   32.5 mg Intravenous Q8H   QUEtiapine   25 mg Per Tube QHS   revefenacin   175 mcg Nebulization Daily   sodium  chloride flush  3 mL Intravenous Q12H   sodium chloride  HYPERTONIC  4 mL Nebulization BID   thiamine   100 mg Per Tube Daily    Infusions:  sodium chloride  20 mL/hr at 12/11/24 0312   amiodarone  30 mg/hr (12/13/24 1900)   dexmedetomidine  (PRECEDEX ) IV infusion 1.2 mcg/kg/hr (12/13/24 1900)   DOBUTamine  2.5 mcg/kg/min (12/13/24 1900)   epinephrine  2 mcg/min (12/13/24 1900)   heparin  1,400 Units/hr (12/13/24 1900)   meropenem  (MERREM ) IV Stopped (12/13/24 1426)   norepinephrine  (LEVOPHED ) Adult infusion Stopped (12/13/24 0700)   sodium bicarbonate  25 mEq (Impella PURGE) in dextrose  5 % 1000 mL bag     TPN ADULT (ION) 72 mL/hr at 12/13/24 1900   vancomycin       PRN Medications: sodium chloride , sodium chloride , albuterol , fentaNYL  (SUBLIMAZE ) injection, fentaNYL  (SUBLIMAZE ) injection, hydrALAZINE , LORazepam , midazolam  PF, morphine  injection, ondansetron  (ZOFRAN ) IV, mouth rinse, oxyCODONE , sodium chloride , sodium chloride  flush  Assessment/Plan  1.  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  Valvular Disease  - severe MR posteriorly directed, mod TR. Likely functional.  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 Acute hypoxic/hypercarbic respiratory failure - reintubated overnight due to hypercarbia  - suspect mostly muscle weakness and ab distension - I think he may need trach for slow wean  - doubt volume overload is major issue now as he is well decongested with Impella (LVEDP  < 5 on device)  3. Acute Systolic Heart Failure w/ Biventricular Dysfunction >> caridogenic shock - HS trop not c/w ACS but EKG w/ anteroseptal Qs  - CMRI Severe AR/MR. LVEF 35%  - Cath- normal cors, RA5, PA 32/17 (24), PVR 2.57  Papi 3. CO 4.9 CI 3.8 - Post-op Echo 12/02/24: EF < 20% with severe LVH (new, ?inflammatory), moderately decreased RV function, stable bioprosthetic MV and AoV.  - Impella back up from P-4 -> P-8 overnight. Flows ok. Co0ox 74% Will drop back to P-5. Can remove swan. Would keep Impella in for now until we decide on need for trach and get respiratory status more stable  - Holding GDMT until back off pressors.  - No lasix  today Maybe tomorrow  4.. R hemothorax - Hemothorax s/p right thoracentesis with intercostal artery injury. Massive bleeding requiring multiple products and development of hemorrhagic shock, now improved. S/p IR embolization.  - S/p VATS 12/18 with right chest washout.  - Tolerating heparin  - Chest tube in place,  5.  ETOH Abuse - heavy drinker, at least 5 drinks per evening - may be contributing to CM, reduction of intake imperative  - No evidence of withdrawal   6  Hypomagnesemia - follow; goal >2    7 . DMII  - Hgb A1C 6.1 - On SSI.  8. Ischemic bowel Mesenteric ischemia - CXR with dilated bowels.  - CT scan demonstrated active extravasation from a posterior intercostal artery. Also demonstrated portal venous gas and pneumatosis of the small and large bowel.  - S/p exp/lap 12/11. Patchy ischemia of the distal ileum extending over about 75 cm without perforation. Ischemic segment resected.  Colon was distended with gas but viable. Small bowel left in discontinuity.  - Back to OR 12/15 for ischemic R colon and mesenteric ischemia. S/p exp/lap, wound vac exchange, R colectomy without anastomosis.  - Returned to OR 12/16 for ileostomy creation.  - GSU following. Ileostomy working well - Appears to have ileus.  Continue TPN  9. ID - Covering bowel pathogens with Zosyn .  - Micafungin  started on 12/16 then stopped 12/18. Had candida albicans in trach aspirate 12/14, no organisms in BAL 12/18.  - Blood cultures with no growth  10. AKI - AKI with shock.  - Resolved  Scr 0.74 today  11. FEN - TPN. TFs started  12. VT - Quiescent - On amiodarone  gtt.   13. Pericardial effusion - There is a moderate effusion without tamponade, noted again by echo 12/16.  Will need to follow over time.  - Limited echo 12/17 with moderate effusion.  - This was not drained at time of VATS/right hemothorax washout on 12/18 - Continue to folow d/w TCTS  14 Atrial flutter - Noted transiently.  Now in NSR on amiodarone  gtt.  - On heparin  gtt, will eventually be on warfarin for valves.    Discussed with TCTS and CCM on bedside rounds.   CRITICAL CARE Performed by: Toribio Fuel  Total critical care time: 45 minutes  Critical care time was exclusive of separately billable procedures and treating other patients.  Critical care was necessary to treat or prevent imminent or life-threatening deterioration.  Critical care was time spent personally by me on the following activities: development of treatment plan with patient and/or surrogate as well as nursing, discussions with consultants, evaluation of patient's response to treatment, examination of patient, obtaining history from patient or surrogate, ordering and performing treatments and interventions, ordering and review of laboratory studies, ordering and review of radiographic studies, pulse oximetry and re-evaluation of patient's condition.   Toribio Fuel 12/13/2024 8:31 PM

## 2024-12-13 NOTE — Progress Notes (Signed)
 OT Cancellation Note  Patient Details Name: Alejandro Lopez MRN: [id number] DOB: 1981/04/09   Cancelled Treatment:    Reason Eval/Treat Not Completed: Medical issues which prohibited therapy;Patient not medically ready (Pt reintubated this am; seadted,on pressors. will follow up on a later date.)  [facility] 12/13/2024, 12:57 PM Kreg Sink, OT/L   Acute OT Clinical Specialist Acute Rehabilitation Services Pager [phone] Office [phone]

## 2024-12-13 NOTE — Progress Notes (Signed)
 PT Cancellation Note  Patient Details Name: Alejandro Lopez MRN: [id number] DOB: [birth year]   Cancelled Treatment:    Reason Eval/Treat Not Completed: Medical issues which prohibited therapy today as pt now re-intubated and sedated. Will continue to follow and evaluate as medically appropriate.   Izetta Call, PT, DPT   Acute Rehabilitation Department Office [phone] Secure Chat Communication Preferred   [name] 12/13/2024, 1:46 PM

## 2024-12-14 ENCOUNTER — Ambulatory Visit: Admitting: Surgery

## 2024-12-14 ENCOUNTER — Inpatient Hospital Stay (HOSPITAL_COMMUNITY)

## 2024-12-14 DIAGNOSIS — I5021 Acute systolic (congestive) heart failure: Secondary | ICD-10-CM | POA: Diagnosis not present

## 2024-12-14 LAB — HEPARIN LEVEL (UNFRACTIONATED)
Heparin Unfractionated: 0.23 [IU]/mL — ABNORMAL LOW (ref 0.30–0.70)
Heparin Unfractionated: 0.19 [IU]/mL — ABNORMAL LOW (ref 0.30–0.70)

## 2024-12-14 LAB — COMPREHENSIVE METABOLIC PANEL WITH GFR
ALT: 170 U/L — ABNORMAL HIGH (ref 0–44)
AST: 165 U/L — ABNORMAL HIGH (ref 15–41)
Albumin: 2.1 g/dL — ABNORMAL LOW (ref 3.5–5.0)
Alkaline Phosphatase: 106 U/L (ref 38–126)
Anion gap: 9 (ref 5–15)
BUN: 36 mg/dL — ABNORMAL HIGH (ref 6–20)
CO2: 25 mmol/L (ref 22–32)
Calcium: 9 mg/dL (ref 8.9–10.3)
Chloride: 110 mmol/L (ref 98–111)
Creatinine, Ser: 0.61 mg/dL (ref 0.61–1.24)
GFR, Estimated: >60 mL/min
Glucose, Bld: 136 mg/dL — ABNORMAL HIGH (ref 70–99)
Potassium: 4.9 mmol/L (ref 3.5–5.1)
Sodium: 144 mmol/L (ref 135–145)
Total Bilirubin: 1.9 mg/dL — ABNORMAL HIGH (ref 0.0–1.2)
Total Protein: 6.1 g/dL — ABNORMAL LOW (ref 6.5–8.1)

## 2024-12-14 LAB — EXPECTORATED SPUTUM ASSESSMENT W GRAM STAIN, RFLX TO RESP C

## 2024-12-14 LAB — GLUCOSE, CAPILLARY
Glucose-Capillary: 117 mg/dL — ABNORMAL HIGH (ref 70–99)
Glucose-Capillary: 117 mg/dL — ABNORMAL HIGH (ref 70–99)
Glucose-Capillary: 115 mg/dL — ABNORMAL HIGH (ref 70–99)
Glucose-Capillary: 118 mg/dL — ABNORMAL HIGH (ref 70–99)
Glucose-Capillary: 109 mg/dL — ABNORMAL HIGH (ref 70–99)

## 2024-12-14 LAB — POCT I-STAT 7, (LYTES, BLD GAS, ICA,H+H)
Acid-Base Excess: 3 mmol/L — ABNORMAL HIGH (ref 0.0–2.0)
Bicarbonate: 25.7 mmol/L (ref 20.0–28.0)
Calcium, Ion: 1.23 mmol/L (ref 1.15–1.40)
HCT: 25 % — ABNORMAL LOW (ref 39.0–52.0)
Hemoglobin: 8.5 g/dL — ABNORMAL LOW (ref 13.0–17.0)
O2 Saturation: 99 %
Patient temperature: 37.7
Potassium: 4.7 mmol/L (ref 3.5–5.1)
Sodium: 146 mmol/L — ABNORMAL HIGH (ref 135–145)
TCO2: 27 mmol/L (ref 22–32)
pCO2 arterial: 33.6 mmHg (ref 32–48)
pH, Arterial: 7.495 — ABNORMAL HIGH (ref 7.35–7.45)
pO2, Arterial: 140 mmHg — ABNORMAL HIGH (ref 83–108)

## 2024-12-14 LAB — CULTURE, BLOOD (ROUTINE X 2)
Culture: NO GROWTH
Culture: NO GROWTH
Special Requests: ADEQUATE

## 2024-12-14 LAB — COOXEMETRY PANEL
Carboxyhemoglobin: 1.9 % — ABNORMAL HIGH (ref 0.5–1.5)
Methemoglobin: <0.7 % (ref 0.0–1.5)
O2 Saturation: 73.1 %
Total hemoglobin: 9 g/dL — ABNORMAL LOW (ref 12.0–16.0)

## 2024-12-14 LAB — CBC
HCT: 26.2 % — ABNORMAL LOW (ref 39.0–52.0)
Hemoglobin: 8.9 g/dL — ABNORMAL LOW (ref 13.0–17.0)
MCH: 30.6 pg (ref 26.0–34.0)
MCHC: 34 g/dL (ref 30.0–36.0)
MCV: 90 fL (ref 80.0–100.0)
Platelets: 277 K/uL (ref 150–400)
RBC: 2.91 MIL/uL — ABNORMAL LOW (ref 4.22–5.81)
RDW: 19.3 % — ABNORMAL HIGH (ref 11.5–15.5)
WBC: 16.3 K/uL — ABNORMAL HIGH (ref 4.0–10.5)
nRBC: 0.7 % — ABNORMAL HIGH (ref 0.0–0.2)

## 2024-12-14 LAB — CULTURE, RESPIRATORY W GRAM STAIN

## 2024-12-14 LAB — MAGNESIUM: Magnesium: 2.5 mg/dL — ABNORMAL HIGH (ref 1.7–2.4)

## 2024-12-14 LAB — PHOSPHORUS: Phosphorus: 3.5 mg/dL (ref 2.5–4.6)

## 2024-12-14 LAB — TRIGLYCERIDES: Triglycerides: 317 mg/dL — ABNORMAL HIGH

## 2024-12-14 LAB — LACTATE DEHYDROGENASE: LDH: 876 U/L — ABNORMAL HIGH (ref 105–235)

## 2024-12-14 MED ORDER — DOCUSATE SODIUM 50 MG/5ML PO LIQD
100.0000 mg | Freq: Two times a day (BID) | ORAL | Status: DC
  Administered 2024-12-14 – 2024-12-16 (×3): 100 mg
  Filled 2024-12-14 (×4): qty 10

## 2024-12-14 MED ORDER — INSULIN ASPART 100 UNIT/ML IJ SOLN: 0.0000 [IU] | Freq: Four times a day (QID) | INTRAMUSCULAR | Status: DC

## 2024-12-14 MED ORDER — PROPOFOL 1000 MG/100ML IV EMUL
0.0000 ug/kg/min | INTRAVENOUS | Status: DC
  Administered 2024-12-14: 20 ug/kg/min via INTRAVENOUS
  Administered 2024-12-14 – 2024-12-15 (×2): 60 ug/kg/min via INTRAVENOUS
  Administered 2024-12-15: 50 ug/kg/min via INTRAVENOUS
  Administered 2024-12-15: 60 ug/kg/min via INTRAVENOUS
  Administered 2024-12-15: 50 ug/kg/min via INTRAVENOUS
  Administered 2024-12-15 (×2): 60 ug/kg/min via INTRAVENOUS
  Administered 2024-12-16 (×2): 50 ug/kg/min via INTRAVENOUS
  Administered 2024-12-16 (×3): 60 ug/kg/min via INTRAVENOUS
  Administered 2024-12-16: 20 ug/kg/min via INTRAVENOUS
  Filled 2024-12-14 (×10): qty 100

## 2024-12-14 MED ORDER — SODIUM CHLORIDE 0.9 % IV SOLN: 200.0000 mg | Freq: Once | INTRAVENOUS | Status: DC

## 2024-12-14 MED ORDER — SODIUM CHLORIDE 0.9 % IV SOLN
100.0000 mg | INTRAVENOUS | Status: AC
  Administered 2024-12-14 – 2024-12-20 (×7): 100 mg via INTRAVENOUS
  Filled 2024-12-14 (×7): qty 5

## 2024-12-14 MED ORDER — POLYETHYLENE GLYCOL 3350 17 G PO PACK
17.0000 g | PACK | Freq: Every day | ORAL | Status: DC
  Filled 2024-12-14: qty 1

## 2024-12-14 MED ORDER — TRACE MINERALS CU-MN-SE-ZN 300-55-60-3000 MCG/ML IV SOLN
INTRAVENOUS | Status: AC
  Filled 2024-12-14: qty 875.53

## 2024-12-14 NOTE — TOC Progression Note (Signed)
 Transition of Care [facility]) - Progression Note    Patient Details  Name: Alejandro Lopez MRN: [id number] Date of Birth: 05/25/1981  Transition of Care [facility]) CM/SW Contact  Justina Delcia Czar, RN Phone Number: [phone] 12/14/2024, 11:39 AM  Clinical Narrative:    Patient remains intubated, and remains on amiodarone   and heparin  gtt.   Chart reviewed for discharge readiness, patient not medically stable for d/c. Inpatient CM/CSW will continue to monitor pt's advancement through interdisciplinary progression rounds.   If new pt transition needs arise, MD please place a TOC consult.     Expected Discharge Plan: IP Rehab Facility Barriers to Discharge: Continued Medical Work up    Expected Discharge Plan and Services In-house Referral: Clinical Social Work Discharge Planning Services: CM Consult   Living arrangements for the past 2 months: Single Family Home                    Social Drivers of Health (SDOH) Interventions SDOH Screenings   Food Insecurity: No Food Insecurity (11/22/2024)  Housing: Low Risk (11/22/2024)  Transportation Needs: No Transportation Needs (11/22/2024)  Utilities: Not At Risk (11/22/2024)  Social Connections: Unknown (05/06/2022)   Received from [facility]  Tobacco Use: High Risk (12/06/2024)    Readmission Risk Interventions     No data to display

## 2024-12-14 NOTE — Progress Notes (Signed)
 PHARMACY - TOTAL PARENTERAL NUTRITION CONSULT NOTE   Indication: massive bowel resection   Patient Measurements: Height: 5' 10 (177.8 cm) Weight: 73.4 kg (161 lb 13.1 oz) IBW/kg (Calculated) : 73 TPN AdjBW (KG): 73.4 Body mass index is 23.22 kg/m. Usual Weight: weight 79kg on admission, unclear baseline.   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 Patient underwent an aortic and mitral valve replacement on 12/8 with insertion of Impella. Post-op was found to have continuing HgB drop with noted abdominal pain. Was found to have hemorrhage from intercostal, chest tube and embolization was completed. Workup for dropping HgB included CT angio of chest/ab/and pelvis showed pneumatosis and concern for bowel ischemia. Patient went to OR on 12/12 for ex-lap found to have ischemia of distal ileum over 75cm without perforation. Patient was left in discontinuity with plans to return to OR.  Patient with diet throughout admission however noted nausea and abdominal pain during peri-op time period. Pharmacy consulted to manage TPN.   12/21 Did not tolerate tube feeds, vomiting. Held tube feeds.   Glucose / Insulin : no hx DM, A1c 6.1% - CBGs < 180 on SSI (5 units insulin  aspart/24 hrs)  Electrolytes: Na 146, CoCa 10.6, Phos peaked at 7.7 (did not receive any replacement outside of TPN) now back down 3.5, K spiked 5.5  (received 40 meq yesterday outside of TPN), Mg 2.5 Renal: SCr 0.61 (BL SCr ~ 0.7), BUN down 36 12/15 Lasix  60 mg IV x 2  12/16-12/19 Lasix  80 mg IV x BID 12/20 Lasix  80 mg IV x1 Hepatic: AST/ALT mildly elevated, tbili down 1.9 (no jaundice), alk phos wnl, TG 317, albumin  2.1 - elevation not due to TPN Intake / Output; MIVF: UOP 2.1 ml/kg/hr, NG 1850 mL, chest tube 230 mL, stool 325 mL, net +2.3 L  GI Imaging: 12/11 CT: pneumatosis of small bowel with gas, concerning for bowel  ischemia  GI Surgeries / Procedures:  12/11 ex-lap with distal ileum with ischemia (75cm), left in discontinuity with plans to return to OR on Sunday to evaluate for further ischemia   12/14 ex lap, placement of wound vac, right colectomy due to necrosis, abdomen left open  12/16 small bowel resection, end ileostomy, closure   Central access: PICC placed 12/04/24 TPN start date: 12/04/24  Nutritional Goals:  Goal concentrated TPN rate with lipids is 72 mL/hr (76g/L AA, 38g/L ILE, and 16.5% CHO will provide 131g AA, 285g CHO, 64g ILE and 2134 kcals per day)  RD Estimated Needs Total Energy Estimated Needs: 2200-2400kcals Total Protein Estimated Needs: 125-150 g Total Fluid Estimated Needs: 1.8 L  Current Nutrition:  TPN   Plan:  Concentrate TPN as fluid resuscitation is completed receiving diuresis  Continue TPN with lipids at goal of 72 ml/hr.  Electrolytes in TPN: Na 0 mEq/L, K 50 mEq/L, Ca 73mEq/L, Mg 15mEq/L, Phos 10mmol/L, max CL (ratio is 1:2.27, required for TPN compatibility) Add standard MVI and trace elements to TPN Continue sensitive SSI Q6H  Monitor TPN labs on Mon/Thurs - labs in AM Tolerating tube feeds, will continue at same rate today per primary team    Thank you for allowing pharmacy to be a part of this patients care.  Shelba Collier, PharmD, BCPS Clinical Pharmacist

## 2024-12-14 NOTE — Progress Notes (Signed)
 Late entry for 1830: Patient is becoming increasingly restless and agitated. Attempting to pull out ETT and NG was removed by patient. Dr Claudene at bedside, VO given for soft restraints and propofol  increase.

## 2024-12-14 NOTE — Progress Notes (Signed)
 ANTICOAGULATION CONSULT NOTE  Pharmacy Consult for heparin  Indication: bA/MVR  Allergies[1]  Patient Measurements: Height: 5' 10 (177.8 cm) Weight: 73.4 kg (161 lb 13.1 oz) IBW/kg (Calculated) : 73 Heparin  Dosing Weight: 70 kg   Vital Signs: Temp: 101.1 F (38.4 C) (12/22 1215) BP: 118/68 (12/22 1100) Pulse Rate: 83 (12/22 1215)  Labs: Recent Labs    12/12/24 1436 12/12/24 1445 12/13/24 0408 12/13/24 0520 12/13/24 1058 12/13/24 1543 12/13/24 1807 12/13/24 1841 12/14/24 0501 12/14/24 0518  HGB 10.9*   < > 9.9*   < >  --   --  9.2*  --  8.9* 8.5*  HCT 33.1*   < > 30.3*   < >  --   --  27.0*  --  26.2* 25.0*  PLT 260  --  283  --   --   --   --   --  277  --   HEPARINUNFRC  --    < > 0.23*  --   --   --   --  0.32 0.23*  --   CREATININE 0.50*  --  0.98  --  0.87 0.74  --   --  0.61  --    < > = values in this interval not displayed.    Estimated Creatinine Clearance: 120.4 mL/min (by C-G formula based on SCr of 0.61 mg/dL).  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  Postop course complicated by ischemic bowel s/p exlap 12/11 with partial small bowel resection, abdomen left open.  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   Per Dr. Lucas, plan for 3 months of warfarin therapy with dual valve replacement. Not on anticoagulation prior to admission.  Pharmacy consulted for heparin  dosing.  Impella 5.5 remains in place. Fixed heparin  500 units/h restarted postop 12/17, then heparin  increased 12/19 to low range therapeutic goal with slow titration.  Heparin  level 0.23 on heparin  drip 1400 uts/hr, slightly below goal. CBC stable. No bleeding issues.  Goal of Therapy:  HL 0.3-0.5 Monitor platelets by anticoagulation protocol: Yes   Plan:   Increase IV heparin  to 1500 units/hr. Repeat heparin  level in 6 hrs.  Daily CBC  and heparin  level Na bicarb purge for Impella  Harlene Barlow, [employer], [facility] Clinical Pharmacist  12/14/2024 12:36 PM   [facility] pharmacy phone numbers are listed on [email or web address]               [1] No Known Allergies

## 2024-12-14 NOTE — Progress Notes (Addendum)
 Patient ID: Alejandro Lopez, male   DOB: November 01, 1981, 43 y.o.   MRN: [id number]     Advanced Heart Failure Rounding Note  Cardiologist: None  Chief Complaint: Valvular Heart Disease & HFrEF Patient Profile  43 y.o. male w/ h/o heavy alcohol use (at least 5 drinks per evening) and h/o asthma admitted w/ acute systolic heart failure. Strong family history of cardiac disease: mother and maternal grandmother had heart failure and father with CAD. Admitted with acute HFrEF/cardiogenic shock.   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  12/9 VT/VF ? vagal episode. Extubated 12/11: S/p R thoracentesis with resultant hemothorax.  Chest tube placed. S/p R intercostal artery coil (IR). S/p exp lap for ischemic bowel resection, bowel left in discontinuity 12/15: Back to OR for ischemic R colon and mesenteric ischemia. S/p exp/lap, wound vac exchange, R colectomy without anastomosis.  12/16 Back to OR for small bowel resection and ileostomy  12/18 OR for VATS/hemothorax washout, pericardial window was not done.  12/19 extubated 12/21 Reintubated. Ileus  Subjective:    Impella 5.5  Flow (Liters/min): 2.2 Liters/min Performance Level: P5 Recent Labs    12/12/24 0358 12/13/24 0408 12/14/24 0501  LDH 922* 962* 876*  Platelets 277 Hgb 8.9  Continues on 2.5 DBA. Epi off.   Co-ox 73%.  CVP 10. Auto-diuresing, 3.6L UOP last 24 hrs. Negative about 2L when accounting for NG output. LVEDP 2 on Impella.     Objective:   Weight Range: 73.4 kg Body mass index is 23.22 kg/m.   Vital Signs:   Temp:  [99 F (37.2 C)-100.9 F (38.3 C)] 99.9 F (37.7 C) (12/22 0700) Pulse Rate:  [68-287] 75 (12/22 0700) Resp:  [0-34] 10 (12/22 0700) BP: (93-128)/(75-93) 127/83 (12/22 0700) SpO2:  [91 %-100 %] 100 % (12/22 0700) Arterial Line BP: (91-156)/(65-80) 132/73 (12/22 0700) FiO2 (%):  [40 %-80 %] 40 % (12/22 0309) Weight:  [73.4 kg] 73.4 kg (12/22 0358) Last  BM Date : 12/13/24  Weight change: Filed Weights   12/11/24 0500 12/12/24 0500 12/14/24 0358  Weight: 80 kg 76.2 kg 73.4 kg    Intake/Output:   Intake/Output Summary (Last 24 hours) at 12/14/2024 0737 Last data filed at 12/14/2024 0700 Gross per 24 hour  Intake 4045.18 ml  Output 6030 ml  Net -1984.82 ml     Physical Exam   General:  Awake on vent. HEENT:+ ETT Cor: JVP 8 cm. Regular rate & rhythm. No rubs, gallops or murmurs. R axillary impella Lungs: nonlabored on vent Abdomen: soft, nondistended, + ostomy Extremities: trace edema Neuro: Awake and following commands   Telemetry    In and out of AFL with controlled rate  Labs    CBC Recent Labs    12/13/24 0408 12/13/24 0520 12/14/24 0501 12/14/24 0518  WBC 22.8*  --  16.3*  --   HGB 9.9*   < > 8.9* 8.5*  HCT 30.3*   < > 26.2* 25.0*  MCV 93.8  --  90.0  --   PLT 283  --  277  --    < > = values in this interval not displayed.   Basic Metabolic Panel Recent Labs    87/78/74 0408 12/13/24 0520 12/13/24 1543 12/13/24 1807 12/14/24 0501 12/14/24 0518  NA 142   < > 143   < > 144 146*  K 5.5*   < > 4.9   < > 4.9 4.7  CL 106   < >  109  --  110  --   CO2 29   < > 27  --  25  --   GLUCOSE 156*   < > 122*  --  136*  --   BUN 44*   < > 45*  --  36*  --   CREATININE 0.98   < > 0.74  --  0.61  --   CALCIUM  9.1   < > 8.9  --  9.0  --   MG 3.1*  --   --   --  2.5*  --   PHOS 7.7*   < > 4.7*  --  3.5  --    < > = values in this interval not displayed.   Liver Function Tests Recent Labs    12/13/24 0408 12/13/24 1543 12/14/24 0501  AST 207*  --  165*  ALT 182*  --  170*  ALKPHOS 117  --  106  BILITOT 2.4*  --  1.9*  PROT 6.4*  --  6.1*  ALBUMIN  2.2* 2.1* 2.1*   No results for input(s): LIPASE, AMYLASE in the last 72 hours. Cardiac Enzymes No results for input(s): CKTOTAL, CKMB, CKMBINDEX, TROPONINI in the last 72 hours.  BNP: BNP (last 3 results) No results for input(s): BNP in the  last 8760 hours.  ProBNP (last 3 results) Recent Labs    11/22/24 1958  PROBNP 3,354.0*     D-Dimer No results for input(s): DDIMER in the last 72 hours.  Hemoglobin A1C No results for input(s): HGBA1C in the last 72 hours. Fasting Lipid Panel Recent Labs    12/14/24 0501  TRIG 317*    Thyroid Function Tests No results for input(s): TSH, T4TOTAL, T3FREE, THYROIDAB in the last 72 hours.  Invalid input(s): FREET3  Other results:   Imaging    DG CHEST PORT 1 VIEW Result Date: 12/04/2024 CLINICAL DATA:  Ventilator dependence. EXAM: PORTABLE CHEST 1 VIEW COMPARISON:  12/03/2024 FINDINGS: Endotracheal tube tip is approximately 3.8 cm above the base of the carina. The NG tube passes into the stomach although the distal tip position is not included on the film. Right-sided Impella device is stable in position. A left IJ pulmonary artery catheter tip overlies expected location of the interlobar pulmonary artery. Right pleural drain again noted with persistent right pleural fluid. Basilar collapse/consolidation noted, IMPRESSION: 1. No substantial interval change. 2. Persistent right pleural effusion with right base collapse/consolidation. 3. Support apparatus as above. Electronically Signed   By: Camellia Candle M.D.   On: 12/04/2024 07:15   DG Abd 1 View Result Date: 12/04/2024 CLINICAL DATA:   Routine adult health maintenance Best images obtainable due to patient's condition EXAM: ABDOMEN - 1 VIEW COMPARISON:  12/04/2024 FINDINGS: NG tube tip is in the distal stomach. Diffuse gaseous distention of colon evident, similar to prior. IMPRESSION: NG tube tip is in the distal stomach. Electronically Signed   By: Camellia Candle M.D.   On: 12/04/2024 07:11   CT Angio Chest/Abd/Pel for Dissection W and/or W/WO Addendum Date: 12/03/2024 ADDENDUM #1 ADDENDUM: ---------------------------------------------------- These results were called to Dr. Ethelbert Thain  at 10:15 pm on 12/03/2024.  Electronically signed by: Franky Crease MD 12/03/2024 10:23 PM EST RP Workstation: [id number]   Result Date: 12/03/2024 ORIGINAL REPORT EXAM: CT CHEST, ABDOMEN AND PELVIS WITH AND WITHOUT CONTRAST 12/03/2024 09:52:07 PM TECHNIQUE: CT of the chest, abdomen and pelvis was performed with and without the administration of intravenous contrast. Multiplanar reformatted images are provided for review. Automated exposure  control, iterative reconstruction, and/or weight based adjustment of the mA/kV was utilized to reduce the radiation dose to as low as reasonably achievable. COMPARISON: None available. CLINICAL HISTORY: Acute aortic syndrome (AAS) suspected; Evaluate for intercostal bleeding or other sources after thoracentesis; also evaluate bowel. FINDINGS: CHEST: MEDIASTINUM AND LYMPH NODES: Mild cardiomegaly. Impella device tip in the left ventricle. Swan-ganz catheter tip in the central right pulmonary artery. No evidence of aortic aneurysm or dissection. The central airways are clear. No mediastinal, hilar or axillary lymphadenopathy. LUNGS AND PLEURA: Right chest tube in place. Moderate to large right pleural effusion with compressive atelectasis in the right middle lobe and right lower lobe. There appears to be active extravasation of contrast posteriorly between the right 9th and 10th ribs and likely into the pleural space, possibly related to prior thoracentesis. The pleural fluid appears complex on the right, likely hemothorax. Small left pleural effusion with compressive atelectasis in the left lower lobe. Tiny right pneumothorax. Biapical subpleural blebs. No evidence of pulmonary embolus. ABDOMEN AND PELVIS: LIVER: Gas is seen branching peripherally in the liver compatible with portal venous gas. GALLBLADDER AND BILE DUCTS: Gallbladder is unremarkable. No biliary ductal dilatation. SPLEEN: No acute abnormality. PANCREAS: No acute abnormality. ADRENAL GLANDS: No acute abnormality. KIDNEYS, URETERS AND BLADDER:  No stones in the kidneys or ureters. No hydronephrosis. No perinephric or periureteral stranding. Urinary bladder is unremarkable. GI AND BOWEL: Stomach and small bowel are decompressed. Diffuse gaseous distention of the colon suggests ileus. Moderate stool burden in the rectosigmoid colon. Pneumatosis noted in small bowel loops in the lower pelvis with air in the mesenteric veins feeding these small bowel loops. Pneumatosis also noted in the right colon wall. REPRODUCTIVE ORGANS: No acute abnormality. PERITONEUM AND RETROPERITONEUM: No ascites. No free air. VASCULATURE: Aorta is normal in caliber. ABDOMINAL AND PELVIS LYMPH NODES: No lymphadenopathy. BONES AND SOFT TISSUES: No acute osseous abnormality. No focal soft tissue abnormality. IMPRESSION: 1. Active contrast extravasation between the right 9th and 10th ribs with likely extension into the pleural space, suspicious for ongoing intercostal bleeding likely related to recent thoracentesis. 2. Moderate to large right pleural effusion compatible with hemothorax with compressive atelectasis in the right middle and lower lobes; right chest tube present; tiny right pneumothorax. 3. No evidence of aortic aneurysm, aortic dissection, or pulmonary embolus. 4. Pneumatosis involving small bowel loops in the lower pelvis and the right colon with mesenteric venous gas and portal venous gas, concerning for bowel ischemia. Recommend surgical consultation. 5. Small left pleural effusion with compressive atelectasis in the left lower lobe. 6. . Attempts are being made to contact the ordering physician with the results. An addendum will be made at that time. Electronically signed by: Franky Crease MD 12/03/2024 10:09 PM EST RP Workstation: [id number]   DG Chest Port 1 View Result Date: 12/03/2024 EXAM: 1 VIEW(S) XRAY OF THE CHEST 12/03/2024 07:57:00 PM COMPARISON: Portable chest today at 7:02 pm. CLINICAL HISTORY: Encounter for chest tube placement. FINDINGS: LINES, TUBES AND  DEVICES: A pigtail chest tube has been inserted on the right through the 6th intercostal space, with the pigtail in the lateral upper right thorax. Left IJ Swan-Ganz line again terminates in the distal right pulmonary artery. Stable positioning of right subclavian Impella device. LUNGS AND PLEURA: No pneumothorax. Moderate to large right pleural effusion is still present but decreased in the interval. Small left pleural effusion unchanged. The right mid to lower lung is obscured by pleural fluid. There is patchy consolidation in the left lower lung  field. Left mid and both apical lungs are clear. HEART AND MEDIASTINUM: Sternotomy sutures and two cardiac valve replacements are again shown. There is mild cardiomegaly and mild central vascular prominence as before. BONES AND SOFT TISSUES: No acute osseous abnormality. IMPRESSION: 1. Right pigtail chest tube in position with no measurable pneumothorax. 2. Moderate to large right pleural effusion, decreased compared to earlier today. 3. Patchy consolidation in the left lower lung field. 4. Small left pleural effusion, unchanged. 5. Cardiomegaly . Electronically signed by: Francis Quam MD 12/03/2024 08:10 PM EST RP Workstation: [id number]   DG CHEST PORT 1 VIEW Result Date: 12/03/2024 EXAM: 1 VIEW(S) XRAY OF THE CHEST 12/03/2024 07:05:00 PM COMPARISON: None available. CLINICAL HISTORY: ABLA (acute blood loss anemia). FINDINGS: LINES, TUBES AND DEVICES: Impella device and Swan-Ganz catheter remain in place, unchanged. LUNGS AND PLEURA: Enlarging right pleural effusion, now large with atelectasis throughout the right lung. Vascular congestion and left basilar atelectasis. Low lung volumes. No pneumothorax. HEART AND MEDIASTINUM: No acute abnormality of the cardiac and mediastinal silhouettes. BONES AND SOFT TISSUES: No acute osseous abnormality. IMPRESSION: 1. Enlarging right pleural effusion, now large, with associated right lung atelectasis. 2. Vascular congestion  with left basilar atelectasis. 3. Low lung volumes. Electronically signed by: Franky Crease MD 12/03/2024 07:10 PM EST RP Workstation: [id number]   DG Chest Port 1 View Result Date: 12/03/2024 CLINICAL DATA:  Pleural effusion, status post thoracentesis EXAM: PORTABLE CHEST 1 VIEW COMPARISON:  12/03/2024 FINDINGS: Single frontal view of the chest demonstrates stable left internal jugular flow directed central venous catheter, tip overlying the right infrahilar region. Stable mitral and aortic valve prostheses. Cardiac silhouette remains enlarged. Lung volumes are diminished, with bibasilar veiling opacities, right greater than left, consistent with consolidation and effusions. No appreciable change since prior study. No evidence of pneumothorax. IMPRESSION: 1. Persistent bibasilar consolidation and effusions, right greater than left. 2. No evidence of pneumothorax. 3. Stable enlarged cardiac silhouette. Electronically Signed   By: Ozell Daring M.D.   On: 12/03/2024 16:10    Medications:   Scheduled Medications:  arformoterol   15 mcg Nebulization BID   Chlorhexidine  Gluconate Cloth  6 each Topical Daily   colchicine   0.6 mg Per Tube Daily   famotidine   20 mg Per Tube BID   feeding supplement (PROSource TF20)  60 mL Per Tube Daily   folic acid   1 mg Per Tube Daily   Gerhardt's butt cream   Topical TID   insulin  aspart  0-9 Units Subcutaneous Q4H   lidocaine   1 patch Transdermal Q24H   mouth rinse  15 mL Mouth Rinse Q2H   pantoprazole  (PROTONIX ) IV  40 mg Intravenous Daily   PHENObarbital   97.5 mg Intravenous Q8H   Followed by   PHENObarbital   65 mg Intravenous Q8H   Followed by   [START ON 12/16/2024] PHENObarbital   32.5 mg Intravenous Q8H   QUEtiapine   25 mg Per Tube QHS   revefenacin   175 mcg Nebulization Daily   sodium chloride  flush  3 mL Intravenous Q12H   sodium chloride  HYPERTONIC  4 mL Nebulization BID   thiamine   100 mg Per Tube Daily    Infusions:  sodium chloride  20 mL/hr at  12/11/24 0312   amiodarone  30 mg/hr (12/14/24 0600)   dexmedetomidine  (PRECEDEX ) IV infusion 1.2 mcg/kg/hr (12/14/24 0600)   DOBUTamine  2.5 mcg/kg/min (12/14/24 0600)   epinephrine  1 mcg/min (12/14/24 0600)   heparin  1,400 Units/hr (12/14/24 0600)   meropenem  (MERREM ) IV Stopped (12/14/24 0552)   norepinephrine  (LEVOPHED )  Adult infusion Stopped (12/13/24 0700)   sodium bicarbonate  25 mEq (Impella PURGE) in dextrose  5 % 1000 mL bag     TPN ADULT (ION) 72 mL/hr at 12/14/24 0600   vancomycin  Stopped (12/13/24 2235)    PRN Medications: sodium chloride , sodium chloride , acetaminophen  (TYLENOL ) oral liquid 160 mg/5 mL, albuterol , fentaNYL  (SUBLIMAZE ) injection, fentaNYL  (SUBLIMAZE ) injection, hydrALAZINE , LORazepam , midazolam  PF, morphine  injection, ondansetron  (ZOFRAN ) IV, mouth rinse, oxyCODONE , sodium chloride , sodium chloride  flush  Assessment/Plan  1.  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  Valvular Disease  - severe MR posteriorly directed, mod TR. Likely functional.  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 Acute hypoxic/hypercarbic respiratory failure - reintubated 12/21 due to hypercarbia  - suspect mostly muscle weakness and ab distension - will likely need trach for slow wean - discussed with CCM - doubt volume overload is major issue now as he is well decongested with Impella (LVEDP  2 on device)  3. Acute Systolic Heart Failure w/ Biventricular Dysfunction >> caridogenic shock - HS trop not c/w ACS but EKG w/ anteroseptal Qs  - CMRI Severe AR/MR. LVEF 35%  - Cath- normal cors, RA5, PA 32/17 (24), PVR 2.57 Papi 3. CO 4.9 CI 3.8 - Post-op Echo 12/02/24: EF < 20% with severe LVH (new, ?inflammatory), moderately decreased RV function, stable bioprosthetic MV and AoV.  - Would keep Impella in for now until we decide on need for trach and get respiratory status more stable. Turned down to  P4.  - Continue DBA 2.5 mcg/kg/min - Holding GDMT until back off pressors.  - No lasix  today. He is autodiuresing. LVEDP only 2 on device  4.. R hemothorax - Hemothorax s/p right thoracentesis with intercostal artery injury. Massive bleeding requiring multiple products and development of hemorrhagic shock, now improved. S/p IR embolization.  - S/p VATS 12/18 with right chest washout.  - Tolerating heparin  - Chest tube in place  5.  ETOH Abuse - heavy drinker, at least 5 drinks per evening - may be contributing to CM, reduction of intake imperative  - No evidence of withdrawal   6  Hypomagnesemia - follow; goal >2    7 . DMII  - Hgb A1C 6.1 - On SSI.  8. Ischemic bowel Mesenteric ischemia - CXR with dilated bowels.  - CT scan demonstrated active extravasation from a posterior intercostal artery. Also demonstrated portal venous gas and pneumatosis of the small and large bowel.  - S/p exp/lap 12/11. Patchy ischemia of the distal ileum extending over about 75 cm without perforation. Ischemic segment resected.  Colon was distended with gas but viable. Small bowel left in discontinuity.  - Back to OR 12/15 for ischemic R colon and mesenteric ischemia. S/p exp/lap, wound vac exchange, R colectomy without anastomosis.  - Returned to OR 12/16 for ileostomy creation.  - GSU following. Ileostomy working well - Appears to have ileus. Continue TPN. Holding TF.  9. ID - zosyn  switched to meropenem  12/20 d/t GNR in BAL. Remains on Vanc. - Trach aspirate 12/21 with GPC and GPR. Cutlure pending - Micafungin  started on 12/16 then stopped 12/18. Had candida albicans in trach aspirate 12/14. - Tmax 100.9 overnight  10. AKI - AKI with shock.  - Resolved   11. FEN - TPN.   12. VT - Quiescent - On amiodarone  gtt.   13. Pericardial effusion - There is a moderate effusion without tamponade,  noted again by echo 12/16.  Will need to follow over time.  - Limited echo 12/17 with moderate  effusion.  - This was not drained at time of VATS/right hemothorax washout on 12/18  14 Atrial flutter - In and out of AFL with controlled rate. Continue amiodarone  gtt.  - On heparin  gtt, will eventually be on warfarin for valves.   CRITICAL CARE Performed by: COLLETTA SHAVER N   Total critical care time: 20 minutes  Critical care time was exclusive of separately billable procedures and treating other patients.  Critical care was necessary to treat or prevent imminent or life-threatening deterioration.  Critical care was time spent personally by me on the following activities: development of treatment plan with patient and/or surrogate as well as nursing, discussions with consultants, evaluation of patient's response to treatment, examination of patient, obtaining history from patient or surrogate, ordering and performing treatments and interventions, ordering and review of laboratory studies, ordering and review of radiographic studies, pulse oximetry and re-evaluation of patient's condition.    FINCH, LINDSAY N 12/14/2024 7:37 AM   Agree with above.   Awake on vent. Failed vent wean.   Impella at P-5 with 2.2L output. DBA 2.5 Epi 1. Filling pressures low. Remains in NSR  General:  Sitting up in bed. On vent. awake HEENT: normal Neck: supple. no JVD.  Cor: Regular rate & rhythm. No rubs, gallops or murmurs. Impella site ok  Lungs: clear Abdomen: soft, nontender, nondistended.+ ostomy site Extremities: no cyanosis, clubbing, rash, edema Neuro: awake on vent following commands  He is failing vent wean. Will need trach. Doing well with Impella support. Will wean down to P-4 and follow. May be able to pull Impella tomorrow at time of trach placement. Continue DBA for now. No diuretics today.   CRITICAL CARE Performed by: Cherrie Sieving  Total critical care time: 37 minutes  Critical care time was exclusive of separately billable procedures and treating other  patients.  Critical care was necessary to treat or prevent imminent or life-threatening deterioration.  Critical care was time spent personally by me (independent of midlevel providers or residents) on the following activities: development of treatment plan with patient and/or surrogate as well as nursing, discussions with consultants, evaluation of patient's response to treatment, examination of patient, obtaining history from patient or surrogate, ordering and performing treatments and interventions, ordering and review of laboratory studies, ordering and review of radiographic studies, pulse oximetry and re-evaluation of patient's condition.  Sieving Cherrie, MD  3:32 PM

## 2024-12-14 NOTE — Progress Notes (Signed)
 TCTS Evening Rounds:  Hemodynamics stable today. Impella down to P4. Remains in controlled atrial fib/flutter.  Did not tolerate vent weaning attempt this am and therefore planning trach and possible removal of Impella tomorrow.  Good UO. I/O even for the day.   Tmax 101.1 today. Rare enterobacter cloacae and rare candida albicans in BAL Sensitive to MERREM . Discuss adding fungal coverage.  Discussed status and plans with wife at bedside.

## 2024-12-14 NOTE — Progress Notes (Signed)
 4 Days Post-Op Procedures (LRB): VIDEO ASSISTED THORACOSCOPY (VATS)/DECORTICATION (Right) Subjective:  Stable night. No events. Received fentanyl  a few times.  Dobut 2.5, epi 1  MAP 90's with CVP 6, Co-ox 73 with Impella on P5.  -1984/24 hrs. 325 from ileostomy 230 from chest tube. Old dark bloody. 1850 NG but only 200 last shift.  Objective: Vital signs in last 24 hours: Temp:  [99 F (37.2 C)-100.9 F (38.3 C)] 99.9 F (37.7 C) (12/22 0700) Pulse Rate:  [68-287] 75 (12/22 0700) Cardiac Rhythm: Normal sinus rhythm (12/21 2000) Resp:  [0-34] 10 (12/22 0700) BP: (93-128)/(75-93) 127/83 (12/22 0700) SpO2:  [91 %-100 %] 100 % (12/22 0700) Arterial Line BP: (91-156)/(65-80) 132/73 (12/22 0700) FiO2 (%):  [40 %-80 %] 40 % (12/22 0309) Weight:  [73.4 kg] 73.4 kg (12/22 0358)  Hemodynamic parameters for last 24 hours: CVP:  [5 mmHg-17 mmHg] 6 mmHg  Intake/Output from previous day: 12/21 0701 - 12/22 0700 In: 4045.2 [I.V.:3041; IV Piggyback:700] Out: 6030 [Urine:3625; Emesis/NG output:1850; Stool:325; Chest Tube:230] Intake/Output this shift: No intake/output data recorded.  General appearance: alert and cooperative, calm on vent. Neurologic: intact Heart: irregularly irregular rhythm Lungs: rhonchi right chest, clear on left Abdomen: soft, non-tender; not distended, bowel sounds present Extremities: edema minimal Wound: chest incision healing well.  Lab Results: Recent Labs    12/13/24 0408 12/13/24 0520 12/14/24 0501 12/14/24 0518  WBC 22.8*  --  16.3*  --   HGB 9.9*   < > 8.9* 8.5*  HCT 30.3*   < > 26.2* 25.0*  PLT 283  --  277  --    < > = values in this interval not displayed.   BMET:  Recent Labs    12/13/24 1543 12/13/24 1807 12/14/24 0501 12/14/24 0518  NA 143   < > 144 146*  K 4.9   < > 4.9 4.7  CL 109  --  110  --   CO2 27  --  25  --   GLUCOSE 122*  --  136*  --   BUN 45*  --  36*  --   CREATININE 0.74  --  0.61  --   CALCIUM  8.9  --  9.0   --    < > = values in this interval not displayed.    PT/INR: No results for input(s): LABPROT, INR in the last 72 hours. ABG    Component Value Date/Time   PHART 7.495 (H) 12/14/2024 0518   HCO3 25.7 12/14/2024 0518   TCO2 27 12/14/2024 0518   ACIDBASEDEF 1.0 12/13/2024 0647   O2SAT 99 12/14/2024 0518   CBG (last 3)  Recent Labs    12/13/24 1947 12/13/24 2332 12/14/24 0322  GLUCAP 103* 138* 117*   CXR: right base atelectasis/consolidation. May some residual effusion but I think CXR looks better than yesterday.  Assessment/Plan:  CV: Hemodynamics stable on low dose inotrope and Impella at P5. Echo 12/17 showed LVEF 25-30% with moderate circumferential pericardial effusion with shaggy mixed echogenic material. This could not be safely drained through right VATS approach. Will need to followup with echo but I think this would only be safe to drain through left chest at this point. DC epi and continue dobut. Plan to leave Impella in for support and try to extubate again.   Resp: Extubated 12/21 and reintubated due to increased WOB and hypercarbia likely due to generalized weakness, ileus with abd distension and possible aspiration. Discussed with team and will try to extubate again before  considering trach.  Renal: He is auto-diuresing with normal creat. -2L yesterday overall. Hold on diuretics.  GI: ileostomy output increasing. On TNA and tube feeds on hold. NG output decreasing.   ID: Tmax 100.9 yesterday. WBC down to 16.3. BC negative and sputum cultures pending. MERREM Josephus.  ENDO: glucose under good control on SSI.   Discussed on multidisciplinary rounds this am.   LOS: 21 days    Alejandro Lopez 12/14/2024

## 2024-12-14 NOTE — Progress Notes (Signed)
 Nutrition Follow-up  DOCUMENTATION CODES:   Not applicable  INTERVENTION:   TPN to meet nutritional needs; recommend continuing TPN at goal rate until pt demonstrating tolerance of trickle TF with ability to advance to goal.   Reinitiate Tube Feeding when appropriate, recommend post pyloric Cortrak and slow advancement of feeds once trickle tolerated: trickle TF only when appropriate to reassess GI tolerance with close monitoring Vital 1.5 at 20 ml/hr (no advanacement)  Ultimate EN Goal: Vital 1.5 at 60 ml/hr with Pro-Source TF20 60 mL BID  TF at goal provides 2320 kcals, 137 g of protein and 1094 mL of free water   NUTRITION DIAGNOSIS:   Inadequate oral intake related to acute illness, altered GI function as evidenced by NPO status.  Being addressed via nutrition support  GOAL:   Patient will meet greater than or equal to 90% of their needs  Progressing  MONITOR:   Vent status, TF tolerance, Labs, Skin, I & O's, Weight trends (TPN)  REASON FOR ASSESSMENT:   Consult, Ventilator New TPN/TNA, Assessment of nutrition requirement/status  ASSESSMENT:   43 yo male admitted with acute systolic heart failure with severe MR and AI. Pt to OR on 12/08 for AVR, MVR and Impella 5.5 placement and extubated 12/09. Pt with thoracentesis on 12/11 followed by IR coil embolization for bleeding intercostal artery with R hemothorax, pt also developed ischemic bowel requiring bowel resection. PMH includes HTN, tobacco and Etoh abuse (reports drinking at least 5 drinks per night)  11/30 Admitted, Echo EF 35-40% (grade 3 diastolic dysfunction), severe MR, aortic insufficiency 12/08 OR: AVR, MVR, Impella 5.5 with Dr. Lucas 12/09 Extubated 12/11 Thoracentesis with 1L bloody output, post procedure anemia, +R Hemithorax, IR for coil embolization of intercostal artery secondary to active bleeding, OR for ischemic bowel (patchy ischemia distal ileum ~75 cm) without perforation requiring resection,  bowel left in discontinuity, abdomen open with ABThera in place 12/12 TPN initiated 12/15 OR: ischemic R colon requiring R colectomy without anastomosis 12/16 OR: SB resection, abd closed, end ileostomy creation 12/18 OR for VATS/hemothorax washout, pericardial window not performed 12/19 Cortrak placed, extubated 12/21 re-intubated, Cortrak removed, NG placed for decompression  Spoke with RN about pt. Pt re-intubated yesterday and may be extubated today, but was not doing well with wean trials so difficult to predict. If pt requires re-intubation, trach discussion will occur. Cortrak had to be removed yesterday as pt not tolerating trickle feeds at all. NG placed for decompression and 1.85 L of output in 24 h. TPN remains at full rate, recommend continuing until trickle EN can be re-initiated and pt can show tolerance of EN. Will likely need post-pyloric Cortrak with gradual advancement of feeds when ready. Ileus appears to be improving.    Impella 5.5 at P7: If able to be extubated, possible Impella removal  TPN at at 72 ml/hr  UOP 3.6 L x 24 h NG output: 1.85 L x 24 h Ileostomy: 325 ml x 24 h Chest tube: 230 ml x 24 h  Labs: Sodium 146 (high) BUN 36 (high) Creatinine wdl Potassium 4.7 (wdl) Phosphorus 3.5 (wdl) Magnesium  2.5 (high) AST 165/ALT 170 (high) CBGs 117-138  Meds: Pepcid  Prosource TF 20  Folic acid  SSI 0-9 units q4h Protonix  Thiamine  100 mg daily Amio 30 mg/h Dex 1.2 mcg/kg/hr Epi 1 mcg/min  Diet Order:   Diet Order             Diet NPO time specified  Diet effective now  EDUCATION NEEDS:   Not appropriate for education at this time  Skin:  Skin Assessment: Skin Integrity Issues: Skin Integrity Issues:: Other (Comment), DTI DTI: buttocks Wound Vac: open abdomen-ABThera Incisions: chest x 2 Other: new ileostomy  Last BM:  325 ml ileostomy output x 24 h + 1 unmeasured rectal stool  Height:   Ht Readings from Last 1  Encounters:  12/14/24 5' 10 (1.778 m)    Weight:   Wt Readings from Last 1 Encounters:  12/14/24 73.4 kg    BMI:  Body mass index is 23.22 kg/m.  Estimated Nutritional Needs:   Kcal:  2200-2400kcals  Protein:  125-150 g  Fluid:  1.8 L     Josette Glance, MS, RDN, LDN Clinical Dietitian I Please reach out via secure chat

## 2024-12-14 NOTE — Evaluation (Signed)
 Occupational Therapy Evaluation Patient Details Name: Alejandro Lopez MRN: [id number] DOB: Sep 04, 1981 Today's Date: 12/14/2024   History of Present Illness   The pt is a 43 yo male presenting 11/30 with swelling of LE, groin, and abdomen. Work up revealed acute HFrEF, severe MR, aortic insufficiency, and transaminates. 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 Extubated 12/9. S/p thoracentesis 12/11 with rapid recollection of fluid - s/p R chest tube placed 12/11  Coil embolization of right intercostal artery at level of right 10th rib, followed by ex lap with wound vac placement which showed patchy ischemia of ileum  Return to OR 12/14 with R colectomy and 12/16 for ostomy placement. To OR 12/18 for R VATS, washout, and pericardial window. Intubated 12/12-12/19, re-intubated 12/21.    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 Pt seen on vent (settings below) with VSS overall. Nsg gave Ativan  just prior to session due to increased RR. Pt made good effort to participate and appears motivated to work with rehab to increase strength and independence. Pt overall Total A with bed level mobility and ADL tasks due to below listed deficits. Began education with wife regarding BUE ROM and delirium precautions. Pt will eventually benefit from intensive inpatient follow-up therapy, >3 hours/day. Acute OT to follow.      If plan is discharge home, recommend the following:    NA     Functional Status Assessment   Patient has had a recent decline in their functional status and demonstrates the ability to make significant improvements in function in a reasonable and predictable amount of time.     Equipment Recommendations   Other (comment) (TBA)     Recommendations for Other Services   Rehab consult (once able  to participate)     Precautions/Restrictions   Precautions Precautions: Fall;Sternal Precaution Booklet Issued: No Recall of Precautions/Restrictions: Impaired Precaution/Restrictions Comments: ETT, NG tube, impella, R chest tube, foley, R radial a-line Restrictions Weight Bearing Restrictions Per Provider Order: No Other Position/Activity Restrictions: sternal precautions     Mobility Bed Mobility Overal bed mobility: Needs Assistance Bed Mobility:  (chair position)     Supine to sit: Total assist     General bed mobility comments: dependent on trunk and head support via bed in chair position. tolerated HOB to 56 deg with VSS.    Transfers                   General transfer comment: deferred due to limited arousal      Balance Overall balance assessment: Needs assistance     Sitting balance - Comments: dependent on head and trunk support                                   ADL either performed or assessed with clinical judgement   ADL Overall ADL's : Needs assistance/impaired                                       General ADL Comments: total A at this time     Vision Baseline Vision/History: 1 Wears glasses Ability to See in Adequate Light: 1 Impaired Vision  Assessment?: Wears glasses for reading     Perception         Praxis         Pertinent Vitals/Pain Pain Assessment Pain Assessment: No/denies pain     Extremity/Trunk Assessment Upper Extremity Assessment Upper Extremity Assessment: Generalized weakness;RUE deficits/detail;LUE deficits/detail RUE Deficits / Details: generalized weakness, able to make gross grasp, thumb up adn demosntrate isolated finger movement. Grip @ 3/5; elbow/ flexion/ext 3/5; shoulder flx 2/5; extension 3/5. ROM/pulling limited with R shoulder due to impella LUE Deficits / Details: similar to R   Lower Extremity Assessment Lower Extremity Assessment: Defer to PT evaluation    Cervical / Trunk Assessment Cervical / Trunk Assessment: Normal;Other exceptions Cervical / Trunk Exceptions: cervical rotation to R with ETT, difficulty approaching midline despite support. s/p sternotomy   Communication Communication Communication: Impaired Factors Affecting Communication: Trach/intubated   Cognition Arousal: Lethargic, Suspect due to medications (given ativan  prior to session) Behavior During Therapy: WFL for tasks assessed/performed Cognition: Difficult to assess Difficult to assess due to: Level of arousal, Intubated           OT - Cognition Comments: following commands                 Following commands: Impaired Following commands impaired: Follows one step commands with increased time, Follows one step commands inconsistently     Cueing  General Comments   Cueing Techniques: Verbal cues;Gestural cues;Visual cues  VSS on vent FiO2 40% PEEP 5. RR in 30s; Began education regarding delirium precautions - lights on/ blinds open   Exercises Exercises: General Upper Extremity, General Lower Extremity, Other exercises General Exercises - Upper Extremity Shoulder Flexion: AAROM, Both, 5 reps Elbow Flexion: AAROM, Both, 10 reps Elbow Extension: AAROM, Both, 10 reps Digit Composite Flexion: AAROM, Both, 10 reps Composite Extension: AAROM, Both, 10 reps General Exercises - Lower Extremity Heel Slides:  (3 reps) Other Exercises Other Exercises: began educaiton with wife regarding BUE ROM   Shoulder Instructions      Home Living Family/patient expects to be discharged to:: Private residence Living Arrangements: Spouse/significant other;Children (26, 14, and 8 + 3yo grandchild) Available Help at Discharge: Family;Available 24 hours/day (wife currently out of work and available, but may go back to work 3rd shift) Type of Home: House Home Access: Stairs to enter Secretary/administrator of Steps: 1 Entrance Stairs-Rails: None Home Layout: One level      Bathroom Shower/Tub: It Trainer: Standard Bathroom Accessibility: Yes   Home Equipment: None   Additional Comments: reading glasses      Prior Functioning/Environment Prior Level of Function : Independent/Modified Independent;Driving;Working/employed             Mobility Comments: independent ADLs Comments: independent, full time forklift driver    OT Problem List: Decreased strength;Decreased range of motion;Decreased activity tolerance;Impaired balance (sitting and/or standing);Decreased coordination;Decreased cognition;Decreased safety awareness;Decreased knowledge of use of DME or AE;Cardiopulmonary status limiting activity;Impaired UE functional use   OT Treatment/Interventions: Self-care/ADL training;Therapeutic exercise;Neuromuscular education;Energy conservation;DME and/or AE instruction;Therapeutic activities;Cognitive remediation/compensation;Patient/family education;Balance training      OT Goals(Current goals can be found in the care plan section)   Acute Rehab OT Goals Patient Stated Goal: per Deana for [place] to get better OT Goal Formulation: With patient/family Time For Goal Achievement: 12/28/24 Potential to Achieve Goals: Good   OT Frequency:  Min 2X/week    Co-evaluation PT/OT/SLP Co-Evaluation/Treatment: Yes Reason for Co-Treatment: Complexity of the patient's impairments (multi-system involvement);Necessary to address cognition/behavior during functional  activity;For patient/therapist safety;To address functional/ADL transfers PT goals addressed during session: Mobility/safety with mobility;Strengthening/ROM OT goals addressed during session: ADL's and self-care;Strengthening/ROM      AM-PAC OT 6 Clicks Daily Activity     Outcome Measure Help from another person eating meals?: Total Help from another person taking care of personal grooming?: Total Help from another person toileting, which includes using  toliet, bedpan, or urinal?: Total Help from another person bathing (including washing, rinsing, drying)?: Total Help from another person to put on and taking off regular upper body clothing?: Total Help from another person to put on and taking off regular lower body clothing?: Total 6 Click Score: 6   End of Session Equipment Utilized During Treatment: Other (comment) (vent)  Activity Tolerance: Patient limited by fatigue (given Ativan  prior to session) Patient left:    OT Visit Diagnosis: Unsteadiness on feet (R26.81);Other abnormalities of gait and mobility (R26.89);Muscle weakness (generalized) (M62.81);Other symptoms and signs involving the nervous system (R29.898);Other symptoms and signs involving cognitive function                Time: 9091-9064 OT Time Calculation (min): 27 min Charges:  OT General Charges $OT Visit: 1 Visit OT Evaluation $OT Eval High Complexity: 1 High  Sharika Mosquera, OT/L   Acute OT Clinical Specialist Acute Rehabilitation Services Pager [phone] Office [phone]   [facility] 12/14/2024, 11:15 AM

## 2024-12-14 NOTE — Progress Notes (Signed)
 "  Progress Note  4 Days Post-Op  Subjective: -Reintubated 12/21. Following commands. Family at bedside. On epi.   -Chest xray this morning with progression of right  /consolidation with probable effusion. Trace right apical pneumothorax not evident. -Chest tube output 230 mL 12/21-12/22. -NGT output 1850 mL from 12/21-12/22. -Urinary catheter in place draining yellow clear urine.  -Ostomy with thin brown output. Stoma viable.     ROS  All negative with the exception of above.  Objective: Vital signs in last 24 hours: Temp:  [99 F (37.2 C)-100.9 F (38.3 C)] 100 F (37.8 C) (12/22 0803) Pulse Rate:  [68-287] 74 (12/22 0803) Resp:  [0-34] 0 (12/22 0803) BP: (93-128)/(75-93) 118/81 (12/22 0800) SpO2:  [91 %-100 %] 100 % (12/22 0803) Arterial Line BP: (91-156)/(65-80) 124/69 (12/22 0803) FiO2 (%):  [40 %-50 %] 40 % (12/22 0813) Weight:  [73.4 kg] 73.4 kg (12/22 0358) Last BM Date : 12/13/24  Intake/Output from previous day: 12/21 0701 - 12/22 0700 In: 4045.2 [I.V.:3041; IV Piggyback:700] Out: 6030 [Urine:3625; Emesis/NG output:1850; Stool:325; Chest Tube:230] Intake/Output this shift: No intake/output data recorded.  PE: General: Cooperative male who is alert in NAD. HEENT: NGT in place. Heart: HR in 70s. Radial pulses present. Pedal pulses present. Lungs: Intubated.  Abd: Soft, mild distention. Ileostomy viable. Thin liquid brown stool in ostomy collection bag. Vac is in place.  GU: Urinary catheter in place draining yellow clear urine.   Lab Results:  Recent Labs    12/13/24 0408 12/13/24 0520 12/14/24 0501 12/14/24 0518  WBC 22.8*  --  16.3*  --   HGB 9.9*   < > 8.9* 8.5*  HCT 30.3*   < > 26.2* 25.0*  PLT 283  --  277  --    < > = values in this interval not displayed.   BMET Recent Labs    12/13/24 1543 12/13/24 1807 12/14/24 0501 12/14/24 0518  NA 143   < > 144 146*  K 4.9   < > 4.9 4.7  CL 109  --  110  --   CO2 27  --  25  --   GLUCOSE  122*  --  136*  --   BUN 45*  --  36*  --   CREATININE 0.74  --  0.61  --   CALCIUM  8.9  --  9.0  --    < > = values in this interval not displayed.   PT/INR No results for input(s): LABPROT, INR in the last 72 hours. CMP     Component Value Date/Time   NA 146 (H) 12/14/2024 0518   K 4.7 12/14/2024 0518   CL 110 12/14/2024 0501   CO2 25 12/14/2024 0501   GLUCOSE 136 (H) 12/14/2024 0501   BUN 36 (H) 12/14/2024 0501   CREATININE 0.61 12/14/2024 0501   CALCIUM  9.0 12/14/2024 0501   PROT 6.1 (L) 12/14/2024 0501   ALBUMIN  2.1 (L) 12/14/2024 0501   AST 165 (H) 12/14/2024 0501   ALT 170 (H) 12/14/2024 0501   ALKPHOS 106 12/14/2024 0501   BILITOT 1.9 (H) 12/14/2024 0501   GFRNONAA >60 12/14/2024 0501   GFRAA >60 09/18/2018 1226   Lipase     Component Value Date/Time   LIPASE 25 01/22/2021 1006       Studies/Results: DG Chest Port 1 View Result Date: 12/14/2024 CLINICAL DATA:  Ventilator dependence. EXAM: PORTABLE CHEST 1 VIEW COMPARISON:  12/13/2024 FINDINGS: Low volumes. The cardio pericardial silhouette is enlarged. Interval progression  of right base collapse/consolidation with probable effusion. Endotracheal tube tip is 4.3 cm above the base of the carina. NG tube tip is in the stomach with proximal side port positioned at the level of the distal esophagus. Impella device is stable in position. Right IJ central line tip overlies the mid to lower SVC level. Right PICC line tip overlies the upper right atrium. Right chest tube remains in place. Trace right apical pneumothorax seen on the previous study not evident on the current exam. IMPRESSION: 1. Interval progression of right base collapse/consolidation with probable effusion. 2. Trace right apical pneumothorax seen on the previous study not evident on the current exam. 3. NG tube tip is in the stomach with proximal side port positioned at the level of the distal esophagus. Electronically Signed   By: Camellia Candle M.D.   On:  12/14/2024 06:54   DG Chest Port 1 View Result Date: 12/13/2024 CLINICAL DATA:  Central line placement. EXAM: PORTABLE CHEST 1 VIEW COMPARISON:  Radiograph earlier today FINDINGS: New right internal jugular central venous catheter tip overlies the mid lower SVC. Previous weighted enteric tube is been removed, there is a new non weighted enteric tube with tip below the diaphragm, side-port in the distal esophagus. Stable remaining support apparatus include Impella device, right upper extremity PICC, enteric tube and left internal jugular sheath. Right-sided chest tube with tip directed towards the apex. Post median sternotomy with prosthetic cardiac valves. Stable heart size and mediastinal contours. Trace right apical pneumothorax. Improved right lung base aeration. IMPRESSION: 1. New right internal jugular central venous catheter tip overlies the mid lower SVC. 2. New non weighted enteric tube with tip below the diaphragm, side-port in the distal esophagus. Recommend advancement. 3. Trace right apical pneumothorax with chest tube in place. 4. Mildly improved right lung base aeration. Electronically Signed   By: Andrea Gasman M.D.   On: 12/13/2024 16:24   Portable Chest x-ray Result Date: 12/13/2024 EXAM: 1 VIEW(S) XRAY OF THE CHEST 12/13/2024 07:00:15 AM COMPARISON: 12/12/2024 CLINICAL HISTORY: Endotracheal tube present FINDINGS: LINES, TUBES AND DEVICES: Impella device stable in position. Right PICC with tip in expected region of superior cavoatrial junction. Enteric tube courses below with tip and side port in expected region of gastric lumen. Left internal jugular venous sheath in place. PA catheter removed. Endotracheal tube with tip 3 cm above carina. Stable right chest tube. LUNGS AND PLEURA: Decreased aeration of right lung with increased patchy right lung base opacities. Increased interstitial markings. Small right pleural effusion increased. No pneumothorax. HEART AND MEDIASTINUM: Persistent  cardiomegaly. BONES AND SOFT TISSUES: Post median sternotomy. STOMACH AND BOWEL: Gaseous distention of stomach. IMPRESSION: 1. Decreased aeration of the right lung with increased patchy right basilar opacities, increased interstitial markings, and a small right pleural effusion. Electronically signed by: Waddell Calk MD 12/13/2024 07:09 AM EST RP Workstation: [id number]   DG Chest Port 1 View Result Date: 12/12/2024 CLINICAL DATA:  Shortness of breath. EXAM: PORTABLE CHEST 1 VIEW COMPARISON:  Chest radiograph dated 12/12/2024. FINDINGS: Slight interval advancement of the of Swan-Ganz with tip projecting over the inferior right pulmonary artery. Additional support apparatus in similar position. Shallow inspiration with bibasilar atelectasis. Pneumonia is not excluded small right pleural effusion. No pneumothorax. There is cardiomegaly. No acute osseous pathology. Median sternotomy wires. IMPRESSION: Slight interval advancement of the Swan-Ganz with tip projecting over the inferior right pulmonary artery. Electronically Signed   By: Vanetta Chou M.D.   On: 12/12/2024 17:29    Anti-infectives: Anti-infectives (  From admission, onward)    Start     Dose/Rate Route Frequency Ordered Stop   12/13/24 2000  vancomycin  (VANCOREADY) IVPB 1500 mg/300 mL        1,500 mg 150 mL/hr over 120 Minutes Intravenous Every 24 hours 12/13/24 1237     12/13/24 0720  vancomycin  variable dose per unstable renal function (pharmacist dosing)  Status:  Discontinued         Does not apply See admin instructions 12/13/24 0720 12/13/24 1237   12/12/24 1630  meropenem  (MERREM ) 1 g in sodium chloride  0.9 % 100 mL IVPB        1 g 200 mL/hr over 30 Minutes Intravenous Every 8 hours 12/12/24 1534     12/10/24 1000  micafungin  (MYCAMINE ) 100 mg in sodium chloride  0.9 % 100 mL IVPB  Status:  Discontinued       Placed in Followed by Linked Group   100 mg 105 mL/hr over 1 Hours Intravenous Every 24 hours 12/09/24 0704 12/11/24  0710   12/09/24 0830  micafungin  (MYCAMINE ) 200 mg in sodium chloride  0.9 % 100 mL IVPB       Placed in Followed by Linked Group   200 mg 110 mL/hr over 1 Hours Intravenous  Once 12/09/24 0704 12/09/24 1101   12/06/24 2200  vancomycin  (VANCOREADY) IVPB 1250 mg/250 mL  Status:  Discontinued        1,250 mg 166.7 mL/hr over 90 Minutes Intravenous Every 12 hours 12/06/24 0811 12/13/24 0709   12/06/24 0900  vancomycin  (VANCOREADY) IVPB 1750 mg/350 mL        1,750 mg 175 mL/hr over 120 Minutes Intravenous  Once 12/06/24 0802 12/06/24 1032   12/04/24 1100  vancomycin  (VANCOCIN ) IVPB 1000 mg/200 mL premix  Status:  Discontinued        1,000 mg 200 mL/hr over 60 Minutes Intravenous Every 12 hours 12/03/24 2238 12/03/24 2242   12/04/24 0000  vancomycin  (VANCOREADY) IVPB 1500 mg/300 mL  Status:  Discontinued        1,500 mg 150 mL/hr over 120 Minutes Intravenous  Once 12/03/24 2236 12/03/24 2242   12/03/24 2300  piperacillin -tazobactam (ZOSYN ) IVPB 3.375 g  Status:  Discontinued        3.375 g 12.5 mL/hr over 240 Minutes Intravenous Every 8 hours 12/03/24 2236 12/12/24 1534   11/30/24 2130  vancomycin  (VANCOCIN ) IVPB 1000 mg/200 mL premix        1,000 mg 200 mL/hr over 60 Minutes Intravenous  Once 11/30/24 1609 11/30/24 2351   11/30/24 1615  ceFAZolin  (ANCEF ) IVPB 2g/100 mL premix        2 g 200 mL/hr over 30 Minutes Intravenous Every 8 hours 11/30/24 1609 12/02/24 0546   11/30/24 0400  vancomycin  (VANCOREADY) IVPB 1250 mg/250 mL        1,250 mg 166.7 mL/hr over 90 Minutes Intravenous To Surgery 11/29/24 1040 11/30/24 0856   11/30/24 0400  ceFAZolin  (ANCEF ) IVPB 2g/100 mL premix        2 g 200 mL/hr over 30 Minutes Intravenous To Surgery 11/29/24 1040 11/30/24 1239   11/30/24 0400  ceFAZolin  (ANCEF ) IVPB 2g/100 mL premix  Status:  Discontinued        2 g 200 mL/hr over 30 Minutes Intravenous To Surgery 11/29/24 1036 11/30/24 1609        Assessment/Plan 43 y/o M S/P AVR, MCR, Impella  placement by Dr. Lucas 12/8   S/P right thoracentesis 12/11 with hemorrhage from intercostal -chest tube placed by  TCTS.  S/p emergent IR embolizaton  R intercostal artery by Dr. Philip.    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 - WBC 16.3 - Woc following; Changes planned for Tues/Friday of this week. - Emesis >> re-intubated 12/21. Having some thin brown stool from ostomy. Continue to hold TF, NGT to LIWS.   FEN: NPO, NG to LIWS, IVF per primary ID: Meropenem , Vancomycin ; resp cx 12/18 reincubated, BCx 12/17 no growth 4 days VTE: heparin  gtt Foley: in place Dispo: ICU    LOS: 21 days   I reviewed specialist notes, consulting provider notes, nursing notes, last 24 h vitals and pain scores, last 48 h intake and output, last 24 h labs and trends, and last 24 h imaging results.   Marjorie Carlyon Favre, [facility] Surgery 12/14/2024, 8:16 AM Please see Amion for pager number during day hours 7:00am-4:30pm  "

## 2024-12-14 NOTE — Progress Notes (Signed)
 ANTICOAGULATION CONSULT NOTE  Pharmacy Consult for heparin  Indication: bA/MVR  Allergies[1]  Patient Measurements: Height: 5' 10 (177.8 cm) Weight: 73.4 kg (161 lb 13.1 oz) IBW/kg (Calculated) : 73 Heparin  Dosing Weight: 70 kg   Vital Signs: Temp: 99.1 F (37.3 C) (12/22 2015) Temp Source: Axillary (12/22 1917) BP: 123/84 (12/22 2000) Pulse Rate: 79 (12/22 2015)  Labs: Recent Labs    12/12/24 1436 12/12/24 1445 12/13/24 0408 12/13/24 0520 12/13/24 1058 12/13/24 1543 12/13/24 1807 12/13/24 1841 12/14/24 0501 12/14/24 0518 12/14/24 2008  HGB 10.9*   < > 9.9*   < >  --   --  9.2*  --  8.9* 8.5*  --   HCT 33.1*   < > 30.3*   < >  --   --  27.0*  --  26.2* 25.0*  --   PLT 260  --  283  --   --   --   --   --  277  --   --   HEPARINUNFRC  --    < > 0.23*  --   --   --   --  0.32 0.23*  --  0.19*  CREATININE 0.50*  --  0.98  --  0.87 0.74  --   --  0.61  --   --    < > = values in this interval not displayed.    Estimated Creatinine Clearance: 120.4 mL/min (by C-G formula based on SCr of 0.61 mg/dL).  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  Postop course complicated by ischemic bowel s/p exlap 12/11 with partial small bowel resection, abdomen left open.  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   Per Dr. Lucas, plan for 3 months of warfarin therapy with dual valve replacement. Not on anticoagulation prior to admission.  Pharmacy consulted for heparin  dosing.  Impella 5.5 remains in place. Fixed heparin  500 units/h restarted postop 12/17, then heparin  increased 12/19 to low range therapeutic goal with slow titration.  Heparin  level 0.19 (subtherapeutic) on heparin  drip 1500 unitts/hr, trended down further below goal. No issues with line or  bleeding reported per RN.  Goal of Therapy:  HL 0.3-0.5 units/ml Monitor platelets by anticoagulation protocol: Yes   Plan:  Increase IV heparin  to 1650 units/hr. Repeat heparin  level in 6 hrs.  Na bicarb purge for Impella.  Vito Ralph, PharmD, BCPS Please see amion for complete clinical pharmacist phone list  12/14/2024 8:57 PM                [1] No Known Allergies

## 2024-12-14 NOTE — Progress Notes (Signed)

## 2024-12-14 NOTE — Anesthesia Postprocedure Evaluation (Addendum)
"   Anesthesia Post Note  Patient: Alejandro Lopez  Procedure(s) Performed: VIDEO ASSISTED THORACOSCOPY (VATS)/DECORTICATION (Right)     Patient location during evaluation: SICU Anesthesia Type: General Level of consciousness: sedated Pain management: pain level controlled Vital Signs Assessment: post-procedure vital signs reviewed and stable Respiratory status: patient remains intubated per anesthesia plan Cardiovascular status: stable Postop Assessment: no apparent nausea or vomiting Anesthetic complications: no   No notable events documented.  Last Vitals:  Vitals:   12/14/24 0600 12/14/24 0615  BP: 128/87   Pulse: 73 68  Resp: (!) 22 (!) 24  Temp: 37.6 C 37.6 C  SpO2: 100% 100%    Last Pain:  Vitals:   12/14/24 0000  TempSrc: Bladder  PainSc:                  Wynn Kernes S      "

## 2024-12-14 NOTE — Progress Notes (Addendum)
 "  NAME:  Alejandro Lopez, MRN:  [id number], DOB:  1981-06-30, LOS: 21 ADMISSION DATE:  11/22/2024, CONSULTATION DATE:  12/8 REFERRING MD:  Lucas, CHIEF COMPLAINT:  post cardiac surgery critical care services    History of Present Illness:  43 year old male patient with history of hypertension, alcohol and tobacco abuse, presented to the emergency room on 11/30 with approximately 1 month history of progressive dyspnea accompanied by lower extremity swelling extending up to the level of his scrotum and abdomen. Diagnostic evaluation by echocardiogram showed left ventricular ejection fraction 35 to 40% with grade 3 diastolic dysfunction this was further complicated by severe mitral valve regurgitation and aortic insufficiency because of this he was transferred to [facility] for further evaluation. He underwent cardiac catheterization on 12/4: Right heart hemodynamic parameters showed baseline right atrial pressure 5 mmHg, PA pressure 32/17, pulmonary capillary wedge pressure at 14 estimated Fick 4.9 L/min with cardiac index Fick calculated at 2.59 His PAPi was 3 Left heart cath was negative for coronary artery disease Went to OR 12/8 for MVR and AVR w/ impella insertion. PCCM asked to assist w/ post op care   OR course EBL: 1735 Received  Products: cryo 92ml, FFP 401 Also received DDAVP , 2200 crystalloid, 250ml albumin   Cell saver 1125 Pump time 4hrs Clamp time 2hrs 50 min  Events: multiple defibrillations intra-op  Intra-op ECHO EF estimated 40%  Pertinent  Medical History  Tobacco abuse, alcohol abuse, hypertension  Significant Hospital Events: Including procedures, antibiotic start and stop dates in addition to other pertinent events   11/30 admitted/. ECHO 35 to 40% with grade 3 diastolic dysfunction this was further complicated by severe mitral valve regurgitation and aortic insufficiency 12/4 left and right heart cath 12/8 AVR and MVR w/ bioprosthetic valves. Arrived on icu  w/ Impella 5.5 MCS at flow 4 lpm and P 7. Received 2 more PLTs, 2 FFP and 1 cryo in first 6 hrs post op for cont blood loss/oozing. Required NE, epi and milrinone . Good flow on IMPELLA but minimal pulsatility  at P7. Placed on Amio gtt for VT 12/9 received 1 unit PRBC over night for hgb down to 7.5, hgb 8 getting second unit of blood. Still on P7 3.5 lPM. Extubated. 12/11 1L thora, later hemorrhagic shock and hemothorax> IR embolization. Also found to have ischemic bowel and underwent ex-lap with partial resection of ileum. 12/15 plan for ostomy tomorrow, transition epi to levophed   12/16 plan back to OR for ostomy placement  12/17 hemodynamically stable off pressors on Milrinone  0.25 , unable to tolerate SBT, plan for CT chest and heparin  gtt, high fevers>micafungin  12/19 extubated 12/20-12/21 abx to meropenem , aspiration/ agitation/ weakness leading to CO2 retention, RV dysfunction and reintubation, L CVC removed; R CVC placed  Interim History / Subjective:  As above  Objective    Blood pressure 127/83, pulse 75, temperature 99.9 F (37.7 C), resp. rate 10, height 5' 10 (1.778 m), weight 73.4 kg, SpO2 100%. CVP:  [5 mmHg-17 mmHg] 6 mmHg  Vent Mode: PRVC FiO2 (%):  [40 %-50 %] 40 % Set Rate:  [24 bmp] 24 bmp Vt Set:  [580 mL] 580 mL PEEP:  [5 cmH20-8 cmH20] 5 cmH20 Plateau Pressure:  [21 cmH20-27 cmH20] 21 cmH20   Intake/Output Summary (Last 24 hours) at 12/14/2024 0757 Last data filed at 12/14/2024 0700 Gross per 24 hour  Intake 4045.18 ml  Output 6030 ml  Net -1984.82 ml   Filed Weights   12/11/24 0500 12/12/24 0500 12/14/24  0358  Weight: 80 kg 76.2 kg 73.4 kg   General: acute on chronically-ill, in NAD, intubated on Precedex  HEENT: AT/[place], PERRL, mmm Pulm: ETT, ventilator-assisted breaths, rhonchi bilaterally, CT @ -20cm H2O w/ ss output in atrium CV: RRR, no m/g/r, pacing wires in place, impella in place GI: soft, non distended, Ostomy with pink/patent stoma w/ stool  ouput Neuro: GCS E V M , intubated sedated   Patient Lines/Drains/Airways Status     Active Line/Drains/Airways     Name Placement date Placement time Site Days   Arterial Line 12/06/24 Right Radial 12/06/24  --  Radial  8   Peripheral IV 11/26/24 22 G 1.75 Anterior;Right Forearm 11/26/24  1757  Forearm  18   PICC Triple Lumen 12/04/24 Right Brachial 40 cm 0 cm 12/04/24  1623  -- 10   CVC Triple Lumen 12/13/24 Right Internal jugular 12/13/24  1600  -- 1   Impella 11/30/24  1409  -- 14   Chest Tube 1 Right Pleural 28 Fr. 12/10/24  1951  Pleural  4   NG/OG Vented/Dual Lumen 14 Fr. Left nare Marking at nare/corner of mouth 56 cm 12/13/24  0828  Left nare  1   Ileostomy Standard (end) RLQ 12/08/24  1352  RLQ  6   Urethral Catheter Merrick Sheela Mater RN Temperature probe 12/03/24  2345  Temperature probe  11   Airway 7.5 mm 12/13/24  0613  -- 1   Wound 11/30/24 1512 Surgical Closed Surgical Incision Chest Other (Comment) 11/30/24  1512  Chest  14   Wound 11/30/24 1512 Surgical Closed Surgical Incision Chest Right 11/30/24  1512  Chest  14   Wound 12/05/24 1000 Pressure Injury Buttocks Mid Deep Tissue Pressure Injury - Purple or maroon localized area of discolored intact skin or blood-filled blister due to damage of underlying soft tissue from pressure and/or shear. 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 VT; frequent ectopy R pleural effusion> hemothorax post thora, now s/p VATS 12/18 Hypoxic respiratory failure- related to volume  overload and hemothorax, extubated 12/19; reintubated 12/21 due to ileus, aspiration, weakness and recurrent hypercarbia Postop large pericardial effusion- without clear tamponade physiology and unable to drain as appeared fibrinous Fevers, distributive shock- antifungal coverage added to vanc/zosyn  12/17; fever curve broke somewhat; antifungals Dc'd 12/20 as culture neg Ischemic bowel with pneumatosis; s/p partial ileum resection. 12/14 initial resection, 12/16 relook, closure, ostomy Postop ileus Hyperglycemia; h/o pre-DM. A1c 6.1 H/o tobacco abuse H/o asthma vs COPD Deconditioned state Anxiety/ICU delirium/possible delayed etoh w/d-low suspicion for withdrawal, though has been difficult to fully assess due to prior sedation; continue phenobarb taper  Co-rounded with TCTS:  - Alkalotic on ABG; reduce RR on vent; Epi @ overnight->discontinue today - Continue nebs - OGT to decompression with high-volume output in 24 hours; ileus improving with ostomy output noted; will eventually need Cortrak for post-pyloric feeds; would start trickle with slow gradual advancement - BAL +GNR; zosyn  to meropenem  12/21; vanc duration TBD - On  dobutamine  @ 2.5; coox 73 - Plan for PSV trial today; if does well will extubate; if reintubation occurs will move discussions to tracheostomy. Impella and pacer wires removal pending successful extubation. - Amio, heparin  gtts as ordered   sodium chloride  20 mL/hr at 12/11/24 0312   amiodarone  30 mg/hr (12/14/24 0600)   dexmedetomidine  (PRECEDEX ) IV infusion 1.2 mcg/kg/hr (12/14/24 0600)   DOBUTamine  2.5 mcg/kg/min (12/14/24 0600)   epinephrine  1 mcg/min (12/14/24 0600)   heparin  1,400 Units/hr (12/14/24 0600)   meropenem  (MERREM ) IV Stopped (12/14/24 0552)   norepinephrine  (LEVOPHED ) Adult infusion Stopped (12/13/24 0700)   sodium bicarbonate  25 mEq (Impella PURGE) in dextrose  5 % 1000 mL bag     TPN ADULT (ION) 72 mL/hr at 12/14/24 0600   vancomycin  Stopped  (12/13/24 2235)   Critical care time:  35 minutes   CRITICAL CARE Performed by: Jamarr Treinen H Sheritha Louis  Critical care time was exclusive of separately billable procedures and treating other patients.  Critical care was necessary to treat or prevent imminent or life-threatening deterioration.  Critical care was time spent personally by me on the following activities: development of treatment plan with patient and/or surrogate as well as nursing, discussions with consultants, evaluation of patient's response to treatment, examination of patient, obtaining history from patient or surrogate, ordering and performing treatments and interventions, ordering and review of laboratory studies, ordering and review of radiographic studies, pulse oximetry and re-evaluation of patient's condition.  "

## 2024-12-14 NOTE — Evaluation (Signed)
 Physical Therapy Evaluation Patient Details Name: Alejandro Lopez MRN: [id number] DOB: August 18, 1981 Today's Date: 12/14/2024  History of Present Illness  The pt is a 43 yo male presenting 11/30 with swelling of LE, groin, and abdomen. Work up revealed acute HFrEF, severe MR, aortic insufficiency, and transaminates. 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 Extubated 12/9. S/p thoracentesis 12/11 with rapid recollection of fluid - s/p R chest tube placed 12/11  Coil embolization of right intercostal artery at level of right 10th rib, followed by ex lap with wound vac placement which showed patchy ischemia of ileum  Return to OR 12/14 with R colectomy and 12/16 for ostomy placement. To OR 12/18 for R VATS, washout, and pericardial window. Intubated 12/12-12/19, re-intubated 12/21.    PMH includes: HTN, alcohol use (5 drinks/day), tobacco use.   Clinical Impression  Pt in bed upon arrival of PT, agreeable to evaluation at this time. Prior to admission the pt was independent with all mobility and ADLs, working full time as estate agent, living with his wife and 3 children (ages 74-26). The pt presents with mild lethargy (suspect due to ativan  given prior to session) but was able to follow commands for LE and UE movements with increased time and multimodal cues. Pt demos significant weakness in BLE, but good activation and partial ROM against gravity at this time. Tolerated chair position in bed with VSS other than RR in 30s on vent with FiO2 40% and PEEP of 5. Currently dependent on totalA for trunk support and assist to manage positoining of head/neck towards midline. Anticipate pt will benefit from intensive therapies >3hours/day once medically stable to facilitate return to maximal level of independence and mobility.      If plan is discharge home, recommend the following: Two people to help with walking and/or transfers;Two people to help with bathing/dressing/bathroom;Assistance with  feeding;Assist for transportation;Help with stairs or ramp for entrance   Can travel by private vehicle        Equipment Recommendations  (defer until gait training completed)  Recommendations for Other Services  Rehab consult    Functional Status Assessment Patient has had a recent decline in their functional status and demonstrates the ability to make significant improvements in function in a reasonable and predictable amount of time.     Precautions / Restrictions Precautions Precautions: Fall;Sternal Precaution Booklet Issued: No Recall of Precautions/Restrictions: Impaired Precaution/Restrictions Comments: ETT, NG tube, impella, R chest tube, foley, R radial a-line Restrictions Weight Bearing Restrictions Per Provider Order: No Other Position/Activity Restrictions: sternal precautions      Mobility  Bed Mobility Overal bed mobility: Needs Assistance Bed Mobility: Supine to Sit     Supine to sit: Total assist     General bed mobility comments: dependent on trunk and head support via bed in chair position. tolerated HOB to 56 deg with VSS.    Transfers                   General transfer comment: deferred due to limited arousal       Balance Overall balance assessment: Needs assistance     Sitting balance - Comments: dependent on head and trunk support                                     Pertinent Vitals/Pain Pain Assessment Pain Assessment: No/denies pain    Home Living Family/patient expects  to be discharged to:: Private residence Living Arrangements: Spouse/significant other;Children (26, 14, and 8 + 3yo grandchild) Available Help at Discharge: Family;Available 24 hours/day (wife currently out of work and available, but may go back to work 3rd shift) Type of Home: House Home Access: Stairs to enter Entrance Stairs-Rails: None Secretary/administrator of Steps: 1   Home Layout: One level Home Equipment: None Additional  Comments: reading glasses    Prior Function Prior Level of Function : Independent/Modified Independent;Driving;Working/employed             Mobility Comments: independent ADLs Comments: independent, full time forklift driver     Extremity/Trunk Assessment   Upper Extremity Assessment Upper Extremity Assessment: Generalized weakness;RUE deficits/detail;LUE deficits/detail RUE Deficits / Details: generalized weakness, able to make gross grasp, thumb up adn demosntrate isolated finger movement. Grip @ 3/5; elbow/ flexion/ext 3/5; shoulder flx 2/5; extension 3/5. ROM/pulling limited with R shoulder due to impella LUE Deficits / Details: similar to R    Lower Extremity Assessment Lower Extremity Assessment: Defer to PT evaluation    Cervical / Trunk Assessment Cervical / Trunk Assessment: Normal;Other exceptions Cervical / Trunk Exceptions: cervical rotation to R with ETT, difficulty approaching midline despite support. s/p sternotomy  Communication   Communication Communication: Impaired Factors Affecting Communication: Trach/intubated    Cognition Arousal: Lethargic, Suspect due to medications (given ativan  prior to session) Behavior During Therapy: WFL for tasks assessed/performed   PT - Cognitive impairments: Difficult to assess, Attention, Initiation Difficult to assess due to: Level of arousal (given ativan  prior to session)                     PT - Cognition Comments: pt with delay in processing of commands intermittnely, increased cues to attend to therapists Following commands: Impaired Following commands impaired: Follows one step commands with increased time, Follows one step commands inconsistently     Cueing Cueing Techniques: Verbal cues, Gestural cues, Visual cues     General Comments General comments (skin integrity, edema, etc.): VSS on vent FiO2 40% PEEP 5. RR in 30s    Exercises General Exercises - Upper Extremity Shoulder Flexion: AAROM,  Both, 5 reps Elbow Flexion: AAROM, Both, 10 reps Elbow Extension: AAROM, Both, 10 reps General Exercises - Lower Extremity Ankle Circles/Pumps: AROM, Both, 5 reps Short Arc Quad: AROM, Both, 5 reps Heel Slides: AAROM, Both (3 reps)   Assessment/Plan    PT Assessment Patient needs continued PT services  PT Problem List Decreased strength;Decreased activity tolerance;Decreased balance;Decreased mobility;Cardiopulmonary status limiting activity       PT Treatment Interventions Functional mobility training;Therapeutic activities;Therapeutic exercise;Balance training;Patient/family education    PT Goals (Current goals can be found in the Care Plan section)  Acute Rehab PT Goals Patient Stated Goal: to get extubated PT Goal Formulation: With patient Time For Goal Achievement: 12/28/24 Potential to Achieve Goals: Good    Frequency Min 2X/week     Co-evaluation PT/OT/SLP Co-Evaluation/Treatment: Yes Reason for Co-Treatment: Complexity of the patient's impairments (multi-system involvement);Necessary to address cognition/behavior during functional activity;For patient/therapist safety;To address functional/ADL transfers PT goals addressed during session: Mobility/safety with mobility;Strengthening/ROM OT goals addressed during session: ADL's and self-care;Strengthening/ROM       AM-PAC PT 6 Clicks Mobility  Outcome Measure Help needed turning from your back to your side while in a flat bed without using bedrails?: Total Help needed moving from lying on your back to sitting on the side of a flat bed without using bedrails?: Total Help needed moving to  and from a bed to a chair (including a wheelchair)?: Total Help needed standing up from a chair using your arms (e.g., wheelchair or bedside chair)?: Total Help needed to walk in hospital room?: Total Help needed climbing 3-5 steps with a railing? : Total 6 Click Score: 6    End of Session Equipment Utilized During Treatment:  Oxygen  Activity Tolerance: Patient limited by lethargy (s/p ativan ) Patient left: in bed;with call bell/phone within reach;with family/visitor present Nurse Communication: Mobility status PT Visit Diagnosis: Unsteadiness on feet (R26.81);Muscle weakness (generalized) (M62.81)    Time: 9091-9065 PT Time Calculation (min) (ACUTE ONLY): 26 min   Charges:   PT Evaluation $PT Eval High Complexity: 1 High   PT General Charges $$ ACUTE PT VISIT: 1 Visit         Izetta Call, PT, DPT   Acute Rehabilitation Department Office [phone] Secure Chat Communication Preferred  [name] 12/14/2024, 11:33 AM

## 2024-12-14 NOTE — Anesthesia Postprocedure Evaluation (Signed)
"   Anesthesia Post Note  Patient: Alejandro Lopez  Procedure(s) Performed: LAPAROTOMY, EXPLORATORY (Abdomen) CREATION, ILEOSTOMY END (Abdomen) EXCISION, SMALL INTESTINE (Abdomen)     Patient location during evaluation: ICU Anesthesia Type: General Level of consciousness: patient remains intubated per anesthesia plan Vital Signs Assessment: post-procedure vital signs reviewed and stable Respiratory status: patient remains intubated per anesthesia plan Cardiovascular status: stable Anesthetic complications: no   No notable events documented.               Lauraine DASEN Colhoun      "

## 2024-12-15 ENCOUNTER — Inpatient Hospital Stay (HOSPITAL_COMMUNITY)

## 2024-12-15 DIAGNOSIS — I5021 Acute systolic (congestive) heart failure: Secondary | ICD-10-CM | POA: Diagnosis not present

## 2024-12-15 DIAGNOSIS — J9601 Acute respiratory failure with hypoxia: Secondary | ICD-10-CM

## 2024-12-15 LAB — POCT I-STAT 7, (LYTES, BLD GAS, ICA,H+H)
Acid-Base Excess: 1 mmol/L (ref 0.0–2.0)
Bicarbonate: 24.5 mmol/L (ref 20.0–28.0)
Calcium, Ion: 1.28 mmol/L (ref 1.15–1.40)
HCT: 27 % — ABNORMAL LOW (ref 39.0–52.0)
Hemoglobin: 9.2 g/dL — ABNORMAL LOW (ref 13.0–17.0)
O2 Saturation: 99 %
Patient temperature: 37.5
Potassium: 4.3 mmol/L (ref 3.5–5.1)
Sodium: 147 mmol/L — ABNORMAL HIGH (ref 135–145)
TCO2: 26 mmol/L (ref 22–32)
pCO2 arterial: 35.1 mmHg (ref 32–48)
pH, Arterial: 7.454 — ABNORMAL HIGH (ref 7.35–7.45)
pO2, Arterial: 156 mmHg — ABNORMAL HIGH (ref 83–108)

## 2024-12-15 LAB — CBC WITH DIFFERENTIAL/PLATELET
Abs Immature Granulocytes: 0.32 K/uL — ABNORMAL HIGH (ref 0.00–0.07)
Basophils Absolute: 0 K/uL (ref 0.0–0.1)
Basophils Relative: 0 %
Eosinophils Absolute: 0.3 K/uL (ref 0.0–0.5)
Eosinophils Relative: 2 %
HCT: 25.4 % — ABNORMAL LOW (ref 39.0–52.0)
Hemoglobin: 8.6 g/dL — ABNORMAL LOW (ref 13.0–17.0)
Immature Granulocytes: 2 %
Lymphocytes Relative: 13 %
Lymphs Abs: 1.8 K/uL (ref 0.7–4.0)
MCH: 30.5 pg (ref 26.0–34.0)
MCHC: 33.9 g/dL (ref 30.0–36.0)
MCV: 90.1 fL (ref 80.0–100.0)
Monocytes Absolute: 0.8 K/uL (ref 0.1–1.0)
Monocytes Relative: 6 %
Neutro Abs: 10.9 K/uL — ABNORMAL HIGH (ref 1.7–7.7)
Neutrophils Relative %: 77 %
Platelets: 317 K/uL (ref 150–400)
RBC: 2.82 MIL/uL — ABNORMAL LOW (ref 4.22–5.81)
RDW: 19.1 % — ABNORMAL HIGH (ref 11.5–15.5)
Smear Review: NORMAL
WBC: 14.1 K/uL — ABNORMAL HIGH (ref 4.0–10.5)
nRBC: 0.6 % — ABNORMAL HIGH (ref 0.0–0.2)

## 2024-12-15 LAB — COMPREHENSIVE METABOLIC PANEL WITH GFR
ALT: 208 U/L — ABNORMAL HIGH (ref 0–44)
AST: 175 U/L — ABNORMAL HIGH (ref 15–41)
Albumin: 2.2 g/dL — ABNORMAL LOW (ref 3.5–5.0)
Alkaline Phosphatase: 127 U/L — ABNORMAL HIGH (ref 38–126)
Anion gap: 8 (ref 5–15)
BUN: 29 mg/dL — ABNORMAL HIGH (ref 6–20)
CO2: 23 mmol/L (ref 22–32)
Calcium: 8.9 mg/dL (ref 8.9–10.3)
Chloride: 113 mmol/L — ABNORMAL HIGH (ref 98–111)
Creatinine, Ser: 0.51 mg/dL — ABNORMAL LOW (ref 0.61–1.24)
GFR, Estimated: >60 mL/min
Glucose, Bld: 113 mg/dL — ABNORMAL HIGH (ref 70–99)
Potassium: 4.4 mmol/L (ref 3.5–5.1)
Sodium: 144 mmol/L (ref 135–145)
Total Bilirubin: 1.9 mg/dL — ABNORMAL HIGH (ref 0.0–1.2)
Total Protein: 6.2 g/dL — ABNORMAL LOW (ref 6.5–8.1)

## 2024-12-15 LAB — GLUCOSE, CAPILLARY
Glucose-Capillary: 101 mg/dL — ABNORMAL HIGH (ref 70–99)
Glucose-Capillary: 104 mg/dL — ABNORMAL HIGH (ref 70–99)
Glucose-Capillary: 106 mg/dL — ABNORMAL HIGH (ref 70–99)
Glucose-Capillary: 109 mg/dL — ABNORMAL HIGH (ref 70–99)
Glucose-Capillary: 110 mg/dL — ABNORMAL HIGH (ref 70–99)

## 2024-12-15 LAB — COOXEMETRY PANEL
Carboxyhemoglobin: 1.3 % (ref 0.5–1.5)
Carboxyhemoglobin: 0.8 % (ref 0.5–1.5)
Methemoglobin: <0.7 % (ref 0.0–1.5)
Methemoglobin: <0.7 % (ref 0.0–1.5)
O2 Saturation: 83.3 %
O2 Saturation: 88.1 %
Total hemoglobin: 8.2 g/dL — ABNORMAL LOW (ref 12.0–16.0)
Total hemoglobin: 7.7 g/dL — ABNORMAL LOW (ref 12.0–16.0)

## 2024-12-15 LAB — MAGNESIUM: Magnesium: 2.1 mg/dL (ref 1.7–2.4)

## 2024-12-15 LAB — CULTURE, RESPIRATORY W GRAM STAIN

## 2024-12-15 LAB — LACTATE DEHYDROGENASE: LDH: 828 U/L — ABNORMAL HIGH (ref 105–235)

## 2024-12-15 LAB — HEPARIN LEVEL (UNFRACTIONATED): Heparin Unfractionated: 0.12 [IU]/mL — ABNORMAL LOW (ref 0.30–0.70)

## 2024-12-15 LAB — TRIGLYCERIDES
Triglycerides: 408 mg/dL — ABNORMAL HIGH
Triglycerides: 356 mg/dL — ABNORMAL HIGH

## 2024-12-15 MED ORDER — ROCURONIUM BROMIDE 10 MG/ML (PF) SYRINGE
PREFILLED_SYRINGE | INTRAVENOUS | Status: AC
  Administered 2024-12-15: 100 mg
  Filled 2024-12-15: qty 10

## 2024-12-15 MED ORDER — FENTANYL CITRATE (PF) 50 MCG/ML IJ SOSY
PREFILLED_SYRINGE | INTRAMUSCULAR | Status: AC
  Administered 2024-12-15: 200 ug via INTRAVENOUS
  Filled 2024-12-15: qty 2

## 2024-12-15 MED ORDER — MIDAZOLAM HCL (PF) 2 MG/2ML IJ SOLN
4.0000 mg | Freq: Once | INTRAMUSCULAR | Status: AC
  Administered 2024-12-15: 4 mg via INTRAVENOUS

## 2024-12-15 MED ORDER — HEPARIN (PORCINE) 25000 UT/250ML-% IV SOLN
1900.0000 [IU]/h | INTRAVENOUS | Status: DC
  Administered 2024-12-15: 1800 [IU]/h via INTRAVENOUS
  Filled 2024-12-15: qty 250

## 2024-12-15 MED ORDER — ETOMIDATE 2 MG/ML IV SOLN
INTRAVENOUS | Status: AC
  Administered 2024-12-15: 20 mg
  Filled 2024-12-15: qty 20

## 2024-12-15 MED ORDER — TRACE MINERALS CU-MN-SE-ZN 300-55-60-3000 MCG/ML IV SOLN
INTRAVENOUS | Status: AC
  Filled 2024-12-15: qty 800

## 2024-12-15 MED ORDER — SODIUM CHLORIDE 0.9 % IV SOLN
2.0000 g | Freq: Three times a day (TID) | INTRAVENOUS | Status: AC
  Administered 2024-12-15 – 2024-12-18 (×11): 2 g via INTRAVENOUS
  Filled 2024-12-15 (×11): qty 12.5

## 2024-12-15 NOTE — H&P (View-Only) (Signed)
 TCTS Evening Rounds:  Hemodynamics stable today after trach.  Jamal site ok so far with small amount of sanguinous drainage on dressing.  Plan to resume heparin  tonight.  Will stop heparin  at 6 am for Impella removal tomorrow.

## 2024-12-15 NOTE — Progress Notes (Signed)
 SLP Cancellation Note  Patient Details Name: Alejandro Lopez MRN: [id number] DOB: 1981-03-04  Orders received for PMV  and swallow assessments when appropriate. Pt underwent trach today. Remains on vent. SLP will follow along for progress.  Dorothe Elmore L. Vona, MA CCC/SLP Clinical Specialist - Acute Care SLP Acute Rehabilitation Services Office number [phone]        Vona Palma Laurice 12/15/2024, 1:59 PM

## 2024-12-15 NOTE — Progress Notes (Signed)
 5 Days Post-Op Procedures (LRB): VIDEO ASSISTED THORACOSCOPY (VATS)/DECORTICATION (Right) Subjective:  Hemodynamics have been stable on dobutamine  2.5, Impella P4. Co-ox 83.3 this am and 88.1 on repeat later in the morning.  Had percutaneous trach by CCM in ICU this am.  Objective: Vital signs in last 24 hours: Temp:  [98.2 F (36.8 C)-100.9 F (38.3 C)] 99.5 F (37.5 C) (12/23 1300) Pulse Rate:  [68-102] 88 (12/23 1300) Cardiac Rhythm: Normal sinus rhythm (12/23 0800) Resp:  [0-31] 0 (12/23 1300) BP: (99-150)/(70-98) 122/83 (12/23 1300) SpO2:  [96 %-100 %] 97 % (12/23 1300) Arterial Line BP: (95-165)/(61-78) 137/70 (12/23 1300) FiO2 (%):  [40 %-100 %] 100 % (12/23 1022)  Hemodynamic parameters for last 24 hours: CVP:  [5 mmHg-77 mmHg] 13 mmHg  Intake/Output from previous day: 12/22 0701 - 12/23 0700 In: 4078.3 [I.V.:3516.5; IV Piggyback:305] Out: 3725 [Urine:3450; Stool:200; Chest Tube:75] Intake/Output this shift: Total I/O In: 1096.1 [I.V.:924.1; Other:72; IV Piggyback:100] Out: 1165 [Urine:715; Emesis/NG output:400; Chest Tube:50]  General appearance: sedated on vent. Trach site dry. Neurologic: moving all ext and following commands this am prior to sedation for trach. Heart: irregularly irregular rhythm Lungs: clear to auscultation bilaterally Abdomen: soft, non-tender; bowel sounds present. Ileostomy working. Extremities: edema mild Wound: chest incision healing well.  Lab Results: Recent Labs    12/14/24 0501 12/14/24 0518 12/15/24 0430 12/15/24 0437  WBC 16.3*  --  14.1*  --   HGB 8.9*   < > 8.6* 9.2*  HCT 26.2*   < > 25.4* 27.0*  PLT 277  --  317  --    < > = values in this interval not displayed.   BMET:  Recent Labs    12/14/24 0501 12/14/24 0518 12/15/24 0430 12/15/24 0437  NA 144   < > 144 147*  K 4.9   < > 4.4 4.3  CL 110  --  113*  --   CO2 25  --  23  --   GLUCOSE 136*  --  113*  --   BUN 36*  --  29*  --   CREATININE 0.61  --   0.51*  --   CALCIUM  9.0  --  8.9  --    < > = values in this interval not displayed.    PT/INR: No results for input(s): LABPROT, INR in the last 72 hours. ABG    Component Value Date/Time   PHART 7.454 (H) 12/15/2024 0437   HCO3 24.5 12/15/2024 0437   TCO2 26 12/15/2024 0437   ACIDBASEDEF 1.0 12/13/2024 0647   O2SAT 88.1 12/15/2024 1056   CBG (last 3)  Recent Labs    12/14/24 2323 12/15/24 0605 12/15/24 1057  GLUCAP 109* 106* 109*    Assessment/Plan:  CV: Hemodynamically stable on low dose dobut. Will plan to remove Impella 5.5 tomorrow in OR.   Resp: Trached today. Plan vent weaning per CCM.  GI: Gut starting to function with ileostomy output. Can start low dose trickle feeds after Impella removal tomorrow. GS recommends starting at 10 cc/hr for at least 24 hrs.  ID: Tmax 100.6 today. WBC down to 14.1. antibiotics switched to Maxipime  and micafungin  for E. Cloacae and candida in sputum.  Atrial fib/flutter: on IV amio and heparin .  Moderate pericardial effusion: continue observation. It is a complex effusion and would not be straightforward to drain.    LOS: 22 days    Alejandro Lopez 12/15/2024

## 2024-12-15 NOTE — Procedures (Signed)
 Percutaneous Tracheostomy Procedure Note   Alejandro Lopez  [id number]  1981/05/17  Date:12/15/2024  Time:1:30 PM   Provider Performing:Jenin Birdsall  Procedure: Percutaneous Tracheostomy with Bronchoscopic Guidance (68399)  Indication(s) Acute respiratory failure with hypoxia  Consent Risks of the procedure as well as the alternatives and risks of each were explained to the patient and/or caregiver.  Consent for the procedure was obtained.  Anesthesia Etomidate , Versed , Fentanyl , Vecuronium   Time Out Verified patient identification, verified procedure, site/side was marked, verified correct patient position, special equipment/implants available, medications/allergies/relevant history reviewed, required imaging and test results available.   Sterile Technique Maximal sterile technique including sterile barrier drape, hand hygiene, sterile gown, sterile gloves, mask, hair covering.    Procedure Description Appropriate anatomy identified by palpation.  Patient's neck prepped and draped in sterile fashion.  1% lidocaine  with epinephrine  was used to anesthetize skin overlying neck.  1.5cm incision made and blunt dissection performed until tracheal rings could be easily palpated.   Then a size 6 Shiley tracheostomy was placed under bronchoscopic visualization using usual Seldinger technique and serial dilation.   Bronchoscope confirmed placement above the carina.  Tracheostomy was sutured in place with adhesive pad to protect skin under pressure.    Patient connected to ventilator.   Complications/Tolerance None; patient tolerated the procedure well. Chest X-ray is ordered to confirm no post-procedural complication.   EBL Minimal   Specimen(s) None

## 2024-12-15 NOTE — Progress Notes (Signed)
 TCTS Evening Rounds:  Hemodynamics stable today after trach.  Jamal site ok so far with small amount of sanguinous drainage on dressing.  Plan to resume heparin  tonight.  Will stop heparin  at 6 am for Impella removal tomorrow.

## 2024-12-15 NOTE — Consult Note (Addendum)
 WOC Nurse wound follow up Wound type: open surgical to mid-abd Measurement: 13 x 4 x 2.5 cm Wound bed: red, some adipose tissue  Drainage serosanguinous drainage, no malodor Periwound: jagged edges Dressing procedure/placement/frequency: VAC foam exchange Tues and Friday  Mid-abd VAC site 12/15/24   Removed old NPWT dressing and base of wound visualized  Cleansed wound with normal saline x 2 Barrier ring placed adjacent to ostomy to create a barrier. Placed additional drape over sides of wound to prevent leakage Filled wound with  _0__ piece of Mepitel, ____0 piece of white foam, __01__ piece of black foam  Sealed NPWT dressing at Hg neg pressure Patient received IV pain medication per bedside nurse prior to dressing change Patient tolerated procedure well and no leak alarms noted  WOC nurse will continue to provide NPWT dressing changed due to the complexity of the dressing change.   WOC Nurse ostomy follow up Stoma type/location: Right lower abd, ileostomy  Primary nurse reported ostomy leakage; appliance change was done Stomal assessment/size: 44 mm round, stoma is moist, red, viable, well-budded Peristomal assessment: intact, some moisture build up, suturing intact Treatment options for stomal/peristomal skin: placed 1-piece soft convex, barrier ring Output  100 mls liquid brownish effluent Ostomy pouching: 1pc Education provided: patient sedated and intubated in ICU, having bedside trach placed this morning. Enrolled patient in [place] Secure Start Discharge program: No  Ostomy site 12/15/24    Please reconsult if wound worsens in condition and notify provider. Coordinated care with primary nurse and providers.  If an alarm condition cannot be resolved after attempting resolutions listed in the troubleshooting guide, then contact [facility] at [phone]. Promptly notify the treating physician to obtain orders for their desired alternative dressing if the  negative-therapy unit is going to be turned off or has been malfunctioning for 2 hours or more.  Sherrilyn Hals MSN RN CWOCN WOC [facility]  [phone] (Available from 7-3 pm Mon-Friday)

## 2024-12-15 NOTE — Progress Notes (Signed)
 ANTICOAGULATION CONSULT NOTE  Pharmacy Consult for heparin  Indication: bA/MVR  Allergies[1]  Patient Measurements: Height: 5' 10 (177.8 cm) Weight: 73.4 kg (161 lb 13.1 oz) IBW/kg (Calculated) : 73 Heparin  Dosing Weight: 70 kg   Vital Signs: Temp: 99.1 F (37.3 C) (12/23 0400) Temp Source: Axillary (12/22 1917) BP: 129/87 (12/23 0400) Pulse Rate: 81 (12/23 0400)  Labs: Recent Labs    12/13/24 0408 12/13/24 0520 12/13/24 1058 12/13/24 1543 12/13/24 1807 12/14/24 0501 12/14/24 0518 12/14/24 2008 12/15/24 0430 12/15/24 0437  HGB 9.9*   < >  --   --    < > 8.9* 8.5*  --  8.6* 9.2*  HCT 30.3*   < >  --   --    < > 26.2* 25.0*  --  25.4* 27.0*  PLT 283  --   --   --   --  277  --   --  317  --   HEPARINUNFRC 0.23*  --   --   --    < > 0.23*  --  0.19* 0.12*  --   CREATININE 0.98  --  0.87 0.74  --  0.61  --   --   --   --    < > = values in this interval not displayed.    Estimated Creatinine Clearance: 120.4 mL/min (by C-G formula based on SCr of 0.61 mg/dL).  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  Postop course complicated by ischemic bowel s/p exlap 12/11 with partial small bowel resection, abdomen left open.  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   Per Dr. Lucas, plan for 3 months of warfarin therapy with dual valve replacement. Not on anticoagulation prior to admission.  Pharmacy consulted for heparin  dosing.  Impella 5.5 remains in place. Fixed heparin  500 units/h restarted postop 12/17, then heparin  increased 12/19 to low range therapeutic goal with slow titration.  Heparin  level 0.19 > 0.12 after rate increase to 1650 units/hr (subtherapeutic) , continues to trend down further below goal. No issues with line (running through  PICC just fine) or bleeding reported per RN. CBC stable. Impella stable at P4  Goal of Therapy:  HL 0.3-0.5 units/ml Monitor platelets by anticoagulation protocol: Yes   Plan:  Increase IV heparin  to 1800 units/hr. Repeat heparin  level in 6 hours Na bicarb purge for Impella.  Lynwood Poplar, PharmD, BCPS Clinical Pharmacist 12/15/2024 5:13 AM       [1] No Known Allergies

## 2024-12-15 NOTE — Progress Notes (Signed)
 Crit ABG given to [facility] RN.

## 2024-12-15 NOTE — Progress Notes (Addendum)
 Patient ID: Alejandro Lopez, male   DOB: [birth year], 43 y.o.   MRN: [id number]     Advanced Heart Failure Rounding Note  Cardiologist: None  Chief Complaint: Valvular Heart Disease & HFrEF Patient Profile  43 y.o. male w/ h/o heavy alcohol use (at least 5 drinks per evening) and h/o asthma admitted w/ acute systolic heart failure. Strong family history of cardiac disease: mother and maternal grandmother had heart failure and father with CAD. Admitted with acute HFrEF/cardiogenic shock.   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  12/9 VT/VF ? vagal episode. Extubated 12/11: S/p R thoracentesis with resultant hemothorax.  Chest tube placed. S/p R intercostal artery coil (IR). S/p exp lap for ischemic bowel resection, bowel left in discontinuity 12/15: Back to OR for ischemic R colon and mesenteric ischemia. S/p exp/lap, wound vac exchange, R colectomy without anastomosis.  12/16 Back to OR for small bowel resection and ileostomy  12/18 OR for VATS/hemothorax washout, pericardial window was not done.  12/19 extubated 12/21 Reintubated. Ileus  Subjective:    Impella 5.5  Flow (Liters/min): 2 Liters/min Performance Level: P4 Recent Labs    12/13/24 0408 12/14/24 0501 12/15/24 0430  LDH 962* 876* 828*  Hgb 8.6 Platelets 317  Co-ox 83% >> recheck Continues on 2.5 DBA. Epi off.   CVP 9. LVEDP < 5 on Impella. 3.5L UOP last 24 hrs. Autodiuresing.  Agitated last night. Sedated on propofol  but wakes up and follows commands.   Tracheostomy planned for today   Objective:   Weight Range: 73.4 kg Body mass index is 23.22 kg/m.   Vital Signs:   Temp:  [98.2 F (36.8 C)-101.1 F (38.4 C)] 100.6 F (38.1 C) (12/23 0900) Pulse Rate:  [68-102] 102 (12/23 0900) Resp:  [0-33] 27 (12/23 0900) BP: (99-150)/(67-98) 114/78 (12/23 0900) SpO2:  [88 %-100 %] 97 % (12/23 0900) Arterial Line BP: (95-165)/(61-77) 137/75 (12/23 0900) FiO2 (%):  [40 %]  40 % (12/23 0324) Last BM Date : 12/14/24  Weight change: Filed Weights   12/11/24 0500 12/12/24 0500 12/14/24 0358  Weight: 80 kg 76.2 kg 73.4 kg    Intake/Output:   Intake/Output Summary (Last 24 hours) at 12/15/2024 0943 Last data filed at 12/15/2024 0900 Gross per 24 hour  Intake 4092.57 ml  Output 4140 ml  Net -47.43 ml     Physical Exam   General:  Acute on chronically ill appearing. Cor: Regular rate & rhythm. No murmurs. Lungs: clear anteriorly, + ETT Abdomen: Wound RN replacing VAC foam Extremities: no edema Neuro: Sedated on vent but wakes up and follows commands    Telemetry   In and out of AFL, currently SR  Labs    CBC Recent Labs    12/14/24 0501 12/14/24 0518 12/15/24 0430 12/15/24 0437  WBC 16.3*  --  14.1*  --   NEUTROABS  --   --  10.9*  --   HGB 8.9*   < > 8.6* 9.2*  HCT 26.2*   < > 25.4* 27.0*  MCV 90.0  --  90.1  --   PLT 277  --  317  --    < > = values in this interval not displayed.   Basic Metabolic Panel Recent Labs    87/78/74 1543 12/13/24 1807 12/14/24 0501 12/14/24 0518 12/15/24 0430 12/15/24 0437  NA 143   < > 144   < > 144 147*  K 4.9   < > 4.9   < >  4.4 4.3  CL 109  --  110  --  113*  --   CO2 27  --  25  --  23  --   GLUCOSE 122*  --  136*  --  113*  --   BUN 45*  --  36*  --  29*  --   CREATININE 0.74  --  0.61  --  0.51*  --   CALCIUM  8.9  --  9.0  --  8.9  --   MG  --   --  2.5*  --  2.1  --   PHOS 4.7*  --  3.5  --   --   --    < > = values in this interval not displayed.   Liver Function Tests Recent Labs    12/14/24 0501 12/15/24 0430  AST 165* 175*  ALT 170* 208*  ALKPHOS 106 127*  BILITOT 1.9* 1.9*  PROT 6.1* 6.2*  ALBUMIN  2.1* 2.2*   No results for input(s): LIPASE, AMYLASE in the last 72 hours. Cardiac Enzymes No results for input(s): CKTOTAL, CKMB, CKMBINDEX, TROPONINI in the last 72 hours.  BNP: BNP (last 3 results) No results for input(s): BNP in the last 8760  hours.  ProBNP (last 3 results) Recent Labs    11/22/24 1958  PROBNP 3,354.0*     D-Dimer No results for input(s): DDIMER in the last 72 hours.  Hemoglobin A1C No results for input(s): HGBA1C in the last 72 hours. Fasting Lipid Panel Recent Labs    12/15/24 0430  TRIG 408*    Thyroid Function Tests No results for input(s): TSH, T4TOTAL, T3FREE, THYROIDAB in the last 72 hours.  Invalid input(s): FREET3  Other results:   Imaging    DG CHEST PORT 1 VIEW Result Date: 12/04/2024 CLINICAL DATA:  Ventilator dependence. EXAM: PORTABLE CHEST 1 VIEW COMPARISON:  12/03/2024 FINDINGS: Endotracheal tube tip is approximately 3.8 cm above the base of the carina. The NG tube passes into the stomach although the distal tip position is not included on the film. Right-sided Impella device is stable in position. A left IJ pulmonary artery catheter tip overlies expected location of the interlobar pulmonary artery. Right pleural drain again noted with persistent right pleural fluid. Basilar collapse/consolidation noted, IMPRESSION: 1. No substantial interval change. 2. Persistent right pleural effusion with right base collapse/consolidation. 3. Support apparatus as above. Electronically Signed   By: Camellia Candle M.D.   On: 12/04/2024 07:15   DG Abd 1 View Result Date: 12/04/2024 CLINICAL DATA:   Routine adult health maintenance Best images obtainable due to patient's condition EXAM: ABDOMEN - 1 VIEW COMPARISON:  12/04/2024 FINDINGS: NG tube tip is in the distal stomach. Diffuse gaseous distention of colon evident, similar to prior. IMPRESSION: NG tube tip is in the distal stomach. Electronically Signed   By: Camellia Candle M.D.   On: 12/04/2024 07:11   CT Angio Chest/Abd/Pel for Dissection W and/or W/WO Addendum Date: 12/03/2024 ADDENDUM #1 ADDENDUM: ---------------------------------------------------- These results were called to Dr. Lakina Mcintire  at 10:15 pm on 12/03/2024. Electronically  signed by: Franky Crease MD 12/03/2024 10:23 PM EST RP Workstation: [id number]   Result Date: 12/03/2024 ORIGINAL REPORT EXAM: CT CHEST, ABDOMEN AND PELVIS WITH AND WITHOUT CONTRAST 12/03/2024 09:52:07 PM TECHNIQUE: CT of the chest, abdomen and pelvis was performed with and without the administration of intravenous contrast. Multiplanar reformatted images are provided for review. Automated exposure control, iterative reconstruction, and/or weight based adjustment of the mA/kV was utilized to reduce  the radiation dose to as low as reasonably achievable. COMPARISON: None available. CLINICAL HISTORY: Acute aortic syndrome (AAS) suspected; Evaluate for intercostal bleeding or other sources after thoracentesis; also evaluate bowel. FINDINGS: CHEST: MEDIASTINUM AND LYMPH NODES: Mild cardiomegaly. Impella device tip in the left ventricle. Swan-ganz catheter tip in the central right pulmonary artery. No evidence of aortic aneurysm or dissection. The central airways are clear. No mediastinal, hilar or axillary lymphadenopathy. LUNGS AND PLEURA: Right chest tube in place. Moderate to large right pleural effusion with compressive atelectasis in the right middle lobe and right lower lobe. There appears to be active extravasation of contrast posteriorly between the right 9th and 10th ribs and likely into the pleural space, possibly related to prior thoracentesis. The pleural fluid appears complex on the right, likely hemothorax. Small left pleural effusion with compressive atelectasis in the left lower lobe. Tiny right pneumothorax. Biapical subpleural blebs. No evidence of pulmonary embolus. ABDOMEN AND PELVIS: LIVER: Gas is seen branching peripherally in the liver compatible with portal venous gas. GALLBLADDER AND BILE DUCTS: Gallbladder is unremarkable. No biliary ductal dilatation. SPLEEN: No acute abnormality. PANCREAS: No acute abnormality. ADRENAL GLANDS: No acute abnormality. KIDNEYS, URETERS AND BLADDER: No stones in  the kidneys or ureters. No hydronephrosis. No perinephric or periureteral stranding. Urinary bladder is unremarkable. GI AND BOWEL: Stomach and small bowel are decompressed. Diffuse gaseous distention of the colon suggests ileus. Moderate stool burden in the rectosigmoid colon. Pneumatosis noted in small bowel loops in the lower pelvis with air in the mesenteric veins feeding these small bowel loops. Pneumatosis also noted in the right colon wall. REPRODUCTIVE ORGANS: No acute abnormality. PERITONEUM AND RETROPERITONEUM: No ascites. No free air. VASCULATURE: Aorta is normal in caliber. ABDOMINAL AND PELVIS LYMPH NODES: No lymphadenopathy. BONES AND SOFT TISSUES: No acute osseous abnormality. No focal soft tissue abnormality. IMPRESSION: 1. Active contrast extravasation between the right 9th and 10th ribs with likely extension into the pleural space, suspicious for ongoing intercostal bleeding likely related to recent thoracentesis. 2. Moderate to large right pleural effusion compatible with hemothorax with compressive atelectasis in the right middle and lower lobes; right chest tube present; tiny right pneumothorax. 3. No evidence of aortic aneurysm, aortic dissection, or pulmonary embolus. 4. Pneumatosis involving small bowel loops in the lower pelvis and the right colon with mesenteric venous gas and portal venous gas, concerning for bowel ischemia. Recommend surgical consultation. 5. Small left pleural effusion with compressive atelectasis in the left lower lobe. 6. . Attempts are being made to contact the ordering physician with the results. An addendum will be made at that time. Electronically signed by: Franky Crease MD 12/03/2024 10:09 PM EST RP Workstation: [id number]   DG Chest Port 1 View Result Date: 12/03/2024 EXAM: 1 VIEW(S) XRAY OF THE CHEST 12/03/2024 07:57:00 PM COMPARISON: Portable chest today at 7:02 pm. CLINICAL HISTORY: Encounter for chest tube placement. FINDINGS: LINES, TUBES AND DEVICES: A  pigtail chest tube has been inserted on the right through the 6th intercostal space, with the pigtail in the lateral upper right thorax. Left IJ Swan-Ganz line again terminates in the distal right pulmonary artery. Stable positioning of right subclavian Impella device. LUNGS AND PLEURA: No pneumothorax. Moderate to large right pleural effusion is still present but decreased in the interval. Small left pleural effusion unchanged. The right mid to lower lung is obscured by pleural fluid. There is patchy consolidation in the left lower lung field. Left mid and both apical lungs are clear. HEART AND MEDIASTINUM: Sternotomy sutures  and two cardiac valve replacements are again shown. There is mild cardiomegaly and mild central vascular prominence as before. BONES AND SOFT TISSUES: No acute osseous abnormality. IMPRESSION: 1. Right pigtail chest tube in position with no measurable pneumothorax. 2. Moderate to large right pleural effusion, decreased compared to earlier today. 3. Patchy consolidation in the left lower lung field. 4. Small left pleural effusion, unchanged. 5. Cardiomegaly . Electronically signed by: Francis Quam MD 12/03/2024 08:10 PM EST RP Workstation: [id number]   DG CHEST PORT 1 VIEW Result Date: 12/03/2024 EXAM: 1 VIEW(S) XRAY OF THE CHEST 12/03/2024 07:05:00 PM COMPARISON: None available. CLINICAL HISTORY: ABLA (acute blood loss anemia). FINDINGS: LINES, TUBES AND DEVICES: Impella device and Swan-Ganz catheter remain in place, unchanged. LUNGS AND PLEURA: Enlarging right pleural effusion, now large with atelectasis throughout the right lung. Vascular congestion and left basilar atelectasis. Low lung volumes. No pneumothorax. HEART AND MEDIASTINUM: No acute abnormality of the cardiac and mediastinal silhouettes. BONES AND SOFT TISSUES: No acute osseous abnormality. IMPRESSION: 1. Enlarging right pleural effusion, now large, with associated right lung atelectasis. 2. Vascular congestion with left  basilar atelectasis. 3. Low lung volumes. Electronically signed by: Franky Crease MD 12/03/2024 07:10 PM EST RP Workstation: [id number]   DG Chest Port 1 View Result Date: 12/03/2024 CLINICAL DATA:  Pleural effusion, status post thoracentesis EXAM: PORTABLE CHEST 1 VIEW COMPARISON:  12/03/2024 FINDINGS: Single frontal view of the chest demonstrates stable left internal jugular flow directed central venous catheter, tip overlying the right infrahilar region. Stable mitral and aortic valve prostheses. Cardiac silhouette remains enlarged. Lung volumes are diminished, with bibasilar veiling opacities, right greater than left, consistent with consolidation and effusions. No appreciable change since prior study. No evidence of pneumothorax. IMPRESSION: 1. Persistent bibasilar consolidation and effusions, right greater than left. 2. No evidence of pneumothorax. 3. Stable enlarged cardiac silhouette. Electronically Signed   By: Ozell Daring M.D.   On: 12/03/2024 16:10    Medications:   Scheduled Medications:  arformoterol   15 mcg Nebulization BID   Chlorhexidine  Gluconate Cloth  6 each Topical Daily   colchicine   0.6 mg Per Tube Daily   docusate  100 mg Per Tube BID   famotidine   20 mg Per Tube BID   feeding supplement (PROSource TF20)  60 mL Per Tube Daily   folic acid   1 mg Per Tube Daily   Gerhardt's butt cream   Topical TID   insulin  aspart  0-9 Units Subcutaneous Q6H   lidocaine   1 patch Transdermal Q24H   mouth rinse  15 mL Mouth Rinse Q2H   pantoprazole  (PROTONIX ) IV  40 mg Intravenous Daily   PHENObarbital   65 mg Intravenous Q8H   Followed by   [START ON 12/16/2024] PHENObarbital   32.5 mg Intravenous Q8H   polyethylene glycol  17 g Per Tube Daily   QUEtiapine   25 mg Per Tube QHS   revefenacin   175 mcg Nebulization Daily   sodium chloride  flush  3 mL Intravenous Q12H   thiamine   100 mg Per Tube Daily    Infusions:  sodium chloride  20 mL/hr at 12/11/24 0312   amiodarone  30 mg/hr  (12/15/24 0900)   dexmedetomidine  (PRECEDEX ) IV infusion 0.9 mcg/kg/hr (12/15/24 0900)   DOBUTamine  2.5 mcg/kg/min (12/15/24 0900)   epinephrine  Stopped (12/14/24 0745)   heparin  1,800 Units/hr (12/15/24 0900)   meropenem  (MERREM ) IV Stopped (12/15/24 0809)   micafungin  (MYCAMINE ) 100 mg in sodium chloride  0.9 % 100 mL IVPB Stopped (12/14/24 2052)   norepinephrine  (LEVOPHED ) Adult  infusion Stopped (12/13/24 0700)   propofol  (DIPRIVAN ) infusion 60 mcg/kg/min (12/15/24 0900)   sodium bicarbonate  25 mEq (Impella PURGE) in dextrose  5 % 1000 mL bag     TPN ADULT (ION) 72 mL/hr at 12/15/24 0900    PRN Medications: sodium chloride , sodium chloride , acetaminophen  (TYLENOL ) oral liquid 160 mg/5 mL, albuterol , fentaNYL  (SUBLIMAZE ) injection, fentaNYL  (SUBLIMAZE ) injection, hydrALAZINE , LORazepam , midazolam  PF, morphine  injection, ondansetron  (ZOFRAN ) IV, mouth rinse, oxyCODONE , sodium chloride , sodium chloride  flush  Assessment/Plan  1.  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  Valvular Disease  - severe MR posteriorly directed, mod TR. Likely functional.  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 Acute hypoxic/hypercarbic respiratory failure - reintubated 12/21 due to hypercarbia  - suspect mostly muscle weakness and ab distension - will likely need trach for slow wean - discussed with CCM - doubt volume overload is major issue now as he is well decongested with Impella   3. Acute Systolic Heart Failure w/ Biventricular Dysfunction >> caridogenic shock - HS trop not c/w ACS but EKG w/ anteroseptal Qs  - CMRI Severe AR/MR. LVEF 35%  - Cath- normal cors, RA5, PA 32/17 (24), PVR 2.57 Papi 3. CO 4.9 CI 3.8 - Post-op Echo 12/02/24: EF < 20% with severe LVH (new, ?inflammatory), moderately decreased RV function, stable bioprosthetic MV and AoV.  - Would keep Impella in for now until post trach, discussed  with Dr. Dazani Norby. Turned down to P4, tolerating well - Continue DBA 2.5 mcg/kg/min. Repeat co-ox pending - Holding GDMT  - No lasix  today. He is autodiuresing. LVEDP < 5 on device  4.. R hemothorax - Hemothorax s/p right thoracentesis with intercostal artery injury. Massive bleeding requiring multiple products and development of hemorrhagic shock, now improved. S/p IR embolization.  - S/p VATS 12/18 with right chest washout.  - Tolerating heparin  - Chest tube in place  5.  ETOH Abuse - heavy drinker, at least 5 drinks per evening - may be contributing to CM, reduction of intake imperative  - No evidence of withdrawal   6  Hypomagnesemia - follow; goal >2    7 . DMII  - Hgb A1C 6.1 - On SSI.  8. Ischemic bowel Mesenteric ischemia - CXR with dilated bowels.  - CT scan demonstrated active extravasation from a posterior intercostal artery. Also demonstrated portal venous gas and pneumatosis of the small and large bowel.  - S/p exp/lap 12/11. Patchy ischemia of the distal ileum extending over about 75 cm without perforation. Ischemic segment resected.  Colon was distended with gas but viable. Small bowel left in discontinuity.  - Back to OR 12/15 for ischemic R colon and mesenteric ischemia. S/p exp/lap, wound vac exchange, R colectomy without anastomosis.  - Returned to OR 12/16 for ileostomy creation.  - GSU following. Ileostomy working well - Appears to have ileus. Continue TPN. Holding TF, start when okay with surgery.  9. ID - T max 100.6 F overnight - BAL w/ enterobacter cloacae and candida albicans. Zosyn  >> meropenem  12/21, vanc off and micafungin  started 12/22  10. AKI - AKI with shock.  - Resolved   11. FEN - TPN.   12. VT - Quiescent - On amiodarone  gtt.   13. Pericardial effusion - There is a moderate effusion without tamponade, noted again by echo 12/16.  Will need to follow over time.  - Limited echo 12/17 with moderate effusion.  -  This was not drained  at time of VATS/right hemothorax washout on 12/18. Appeared fibrinous.  14 Atrial flutter - In and out of AFL with controlled rate. Continue amiodarone  gtt.  - On heparin  gtt, will eventually be on warfarin for valves.    CRITICAL CARE Performed by: COLLETTA SHAVER N   Total critical care time: 20 minutes  Critical care time was exclusive of separately billable procedures and treating other patients.  Critical care was necessary to treat or prevent imminent or life-threatening deterioration.  Critical care was time spent personally by me on the following activities: development of treatment plan with patient and/or surrogate as well as nursing, discussions with consultants, evaluation of patient's response to treatment, examination of patient, obtaining history from patient or surrogate, ordering and performing treatments and interventions, ordering and review of laboratory studies, ordering and review of radiographic studies, pulse oximetry and re-evaluation of patient's condition.    FINCH, LINDSAY N 12/15/2024 9:43 AM  Agree with above.  Awake on vent. Has failed vent wean several times.   Remain on DBA 3 and impella at P-4. LVEDP low. Flows are good. Waveforms ok   General:  awake on vent  HEENT +ETT Neck: supple. no JVD.  Cor: Regular rate & rhythm. No rubs, gallops or murmurs. + impella 5.5 site ok  Lungs: clear Abdomen: soft, nontender, nondistended.Good bowel sounds. Ostomy site ok  Extremities: no cyanosis, clubbing, rash, edema Neuro: alert & orientedx3, cranial nerves grossly intact. moves all 4 extremities w/o difficulty. Affect pleasant  Agree with plan for trach today and likely OK for impella removal tomorrow in OR. Continue dobutamine    D/w CCM and TCTS   CRITICAL CARE Performed by: Cherrie Sieving  Total critical care time: 39 minutes  Critical care time was exclusive of separately billable procedures and treating other patients.  Critical care was  necessary to treat or prevent imminent or life-threatening deterioration.  Critical care was time spent personally by me (independent of midlevel providers or residents) on the following activities: development of treatment plan with patient and/or surrogate as well as nursing, discussions with consultants, evaluation of patient's response to treatment, examination of patient, obtaining history from patient or surrogate, ordering and performing treatments and interventions, ordering and review of laboratory studies, ordering and review of radiographic studies, pulse oximetry and re-evaluation of patient's condition.  Sieving Cherrie, MD  12:21 AM

## 2024-12-15 NOTE — Progress Notes (Signed)
 Physical Therapy Treatment Patient Details Name: Alejandro Lopez MRN: [id number] DOB: February 01, 1981 Today's Date: 12/15/2024   History of Present Illness The pt is a 43 yo male presenting 11/30 with swelling of LE, groin, and abdomen. Work up revealed acute HFrEF, severe MR, aortic insufficiency, and transaminates. 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 Extubated 12/9. S/p thoracentesis 12/11 with 1000cc's of blood drained from R pleural space, rapid recollection and pt then s/p R chest tube placed 12/11 with 2.5L bloody output. S/p  coil embolization of right intercostal artery at level of right 10th rib, followed by ex lap with wound vac placement which showed patchy ischemia of ileum extending 75cm. Return to OR 12/14 with R colectomy and 12/16 for ostomy placement. To OR 12/18 for R VATS, washout, and pericardial window. Intubated 12/12-12/19, re-intubated 12/21.    PMH includes: HTN, alcohol use (5 drinks/day), tobacco use, but is limited by limited access to healthcare.    PT Comments  The pt was agreeable to session, presents with increased lethargy today due to increased sedating meds after pulling NG tube out last night. Pt still able to follow commands with increased time for UE strengthening and ROM, and started LE strengthening and ROM exercises, but limited by RR to 40-50s and vent alarming. RN present and attempted suctioning, repositioning, decided to let pt rest while awaiting RT arrival. Will continue to follow and progress as tolerated.     If plan is discharge home, recommend the following: Two people to help with walking and/or transfers;Two people to help with bathing/dressing/bathroom;Assistance with feeding;Assist for transportation;Help with stairs or ramp for entrance   Can travel by private vehicle        Equipment Recommendations   (defer until gait training completed)    Recommendations for Other Services Rehab consult     Precautions / Restrictions  Precautions Precautions: Fall;Sternal Precaution Booklet Issued: No Recall of Precautions/Restrictions: Impaired Precaution/Restrictions Comments: ETT, NG tube, impella, R chest tube, foley, R radial a-line Restrictions Weight Bearing Restrictions Per Provider Order: No Other Position/Activity Restrictions: sternal precautions     Mobility  Bed Mobility Overal bed mobility: Needs Assistance Bed Mobility: Supine to Sit     Supine to sit: Total assist     General bed mobility comments: dependent on trunk and head support via bed in chair position. increased RR and vent alarming    Transfers                   General transfer comment: deferred due to limited arousal and increased RR       Balance Overall balance assessment: Needs assistance     Sitting balance - Comments: dependent on head and trunk support                                    Communication Communication Communication: Impaired Factors Affecting Communication: Trach/intubated  Cognition Arousal: Lethargic, Suspect due to medications (given ativan  prior to session) Behavior During Therapy: WFL for tasks assessed/performed   PT - Cognitive impairments: Difficult to assess, Attention, Initiation Difficult to assess due to: Level of arousal (given ativan  prior to session)                     PT - Cognition Comments: ptwith increased lethargy, increasedsedation after pulling lines. able to visually attend and follow commands with increased time Following commands: Impaired  Following commands impaired: Follows one step commands with increased time, Follows one step commands inconsistently    Cueing Cueing Techniques: Verbal cues, Gestural cues, Visual cues  Exercises General Exercises - Upper Extremity Shoulder Flexion: AAROM, Right, 10 reps Elbow Flexion: AAROM, 10 reps, Right Elbow Extension: 10 reps, Right Digit Composite Flexion: AAROM, Right, 10 reps General Exercises  - Lower Extremity Ankle Circles/Pumps: AAROM, Left, 10 reps, Supine Quad Sets: AAROM, Left, 10 reps, Supine Heel Slides: AAROM, Left, 5 reps (partial ROM) Other Exercises Other Exercises: limited before completing all extremities by RR elevated and vent alarming, waiting RTarrival    General Comments        Pertinent Vitals/Pain Pain Assessment Pain Assessment: No/denies pain    Home Living                          Prior Function            PT Goals (current goals can now be found in the care plan section) Acute Rehab PT Goals Patient Stated Goal: to get extubated PT Goal Formulation: With patient Time For Goal Achievement: 12/28/24 Potential to Achieve Goals: Good Progress towards PT goals: Progressing toward goals    Frequency    Min 2X/week       AM-PAC PT 6 Clicks Mobility   Outcome Measure  Help needed turning from your back to your side while in a flat bed without using bedrails?: Total Help needed moving from lying on your back to sitting on the side of a flat bed without using bedrails?: Total Help needed moving to and from a bed to a chair (including a wheelchair)?: Total Help needed standing up from a chair using your arms (e.g., wheelchair or bedside chair)?: Total Help needed to walk in hospital room?: Total Help needed climbing 3-5 steps with a railing? : Total 6 Click Score: 6    End of Session Equipment Utilized During Treatment: Oxygen  Activity Tolerance: Patient limited by lethargy (s/p ativan ) Patient left: in bed;with call bell/phone within reach;with family/visitor present Nurse Communication: Mobility status PT Visit Diagnosis: Unsteadiness on feet (R26.81);Muscle weakness (generalized) (M62.81)     Time: 9141-9088 PT Time Calculation (min) (ACUTE ONLY): 13 min  Charges:    $Therapeutic Exercise: 8-22 mins PT General Charges $$ ACUTE PT VISIT: 1 Visit                     Izetta Call, PT, DPT   Acute Rehabilitation  Department Office [phone] Secure Chat Communication Preferred   [name] 12/15/2024, 9:32 AM

## 2024-12-15 NOTE — Progress Notes (Signed)
. ° ° °  PROCEDURAL EXPEDITER PROGRESS NOTE  Patient Name: Alejandro Lopez  DOB:02-10-1981 Date of Admission: 11/22/2024  Date of Assessment:12/15/2024   -------------------------------------------------------------------------------------------------------------------   Brief clinical summary: Pt to OR tomorrow for the removal of Imprella  Orders in place:  Yes   Labs, test, and orders reviewed: Y  Requires surgical clearance:  No  Barriers noted: N/A    -------------------------------------------------------------------------------------------------------------------  [facility], [place], [place] Please contact us  directly via secure chat (search for [facility]) or by calling us  at [phone] [facility]).

## 2024-12-15 NOTE — Anesthesia Preprocedure Evaluation (Addendum)
"                                    Anesthesia Evaluation  Patient identified by MRN, date of birth, ID band Patient awake    Reviewed: Allergy & Precautions, H&P , NPO status , Patient's Chart, lab work & pertinent test results  Airway Mallampati: Trach       Dental no notable dental hx. (+) Teeth Intact   Pulmonary asthma , Current Smoker and Patient abstained from smoking.   Pulmonary exam normal breath sounds clear to auscultation       Cardiovascular Exercise Tolerance: Good hypertension, +CHF   Rhythm:Regular Rate:Normal     Neuro/Psych negative neurological ROS  negative psych ROS   GI/Hepatic negative GI ROS, Neg liver ROS,,,  Endo/Other  negative endocrine ROS    Renal/GU negative Renal ROS  negative genitourinary   Musculoskeletal   Abdominal   Peds  Hematology negative hematology ROS (+)   Anesthesia Other Findings   Reproductive/Obstetrics negative OB ROS                              Anesthesia Physical Anesthesia Plan  ASA: 4  Anesthesia Plan: General   Post-op Pain Management: Minimal or no pain anticipated   Induction:   PONV Risk Score and Plan: 1 and Midazolam   Airway Management Planned: Tracheostomy  Additional Equipment:   Intra-op Plan:   Post-operative Plan: Post-operative intubation/ventilation  Informed Consent: I have reviewed the patients History and Physical, chart, labs and discussed the procedure including the risks, benefits and alternatives for the proposed anesthesia with the patient or authorized representative who has indicated his/her understanding and acceptance.     Dental advisory given  Plan Discussed with: CRNA  Anesthesia Plan Comments:          Anesthesia Quick Evaluation  "

## 2024-12-15 NOTE — Progress Notes (Addendum)
 PHARMACY - TOTAL PARENTERAL NUTRITION CONSULT NOTE   Indication: massive bowel resection   Patient Measurements: Height: 5' 10 (177.8 cm) Weight: 73.4 kg (161 lb 13.1 oz) IBW/kg (Calculated) : 73 TPN AdjBW (KG): 73.4 Body mass index is 23.22 kg/m. Usual Weight: weight 79kg on admission, unclear baseline.   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 Patient underwent an aortic and mitral valve replacement on 12/8 with insertion of Impella. Post-op was found to have continuing HgB drop with noted abdominal pain. Was found to have hemorrhage from intercostal, chest tube and embolization was completed. Workup for dropping HgB included CT angio of chest/ab/and pelvis showed pneumatosis and concern for bowel ischemia. Patient went to OR on 12/12 for ex-lap found to have ischemia of distal ileum over 75cm without perforation. Patient was left in discontinuity with plans to return to OR.  Patient with diet throughout admission however noted nausea and abdominal pain during peri-op time period. Pharmacy consulted to manage TPN.   12/21 Did not tolerate tube feeds, vomiting. Held tube feeds.   Glucose / Insulin : no hx DM, A1c 6.1% - CBGs < 180 on SSI (5 units insulin  aspart/24 hrs)  Electrolytes: Na 147, CoCa 10.6, Phos peaked at 7.7 (did not receive any replacement outside of TPN) now back down 3.5, K down at 4.3, Mg 2.1 Renal: SCr 0.61 (BL SCr ~ 0.7), BUN down 36 12/15 Lasix  60 mg IV x 2  12/16-12/19 Lasix  80 mg IV x BID 12/20 Lasix  80 mg IV x1 Hepatic: AST/ALT mildly elevated, tbili down 1.9 (no jaundice), alk phos wnl, TG 317 > 408 (repeat 356) with start of prop gtt, albumin  2.2 - elevation not due to TPN Intake / Output; MIVF: UOP 2 ml/kg/hr, NG not charted, chest tube 75 mL, stool 200 mL, net +3530 mL  GI Imaging: 12/11 CT: pneumatosis of small bowel with gas, concerning for bowel  ischemia  GI Surgeries / Procedures:  12/11 ex-lap with distal ileum with ischemia (75cm), left in discontinuity with plans to return to OR on Sunday to evaluate for further ischemia   12/14 ex lap, placement of wound vac, right colectomy due to necrosis, abdomen left open  12/16 small bowel resection, end ileostomy, closure   Central access: PICC placed 12/04/24 TPN start date: 12/04/24  Nutritional Goals:  Goal concentrated TPN rate with lipids is 72 mL/hr (76g/L AA, 38g/L ILE, and 16.5% CHO will provide 131g AA, 285g CHO, 64g ILE and 2134 kcals per day)  Goal concentrated TPN rate without lipids is 50 mL/hr (provides 120g AA, 240g CHO, and ~1300 kCal per day) > 12/22 PM: propofol  gtt started, current rate @ ~26 mL/hr = ~ 700 kcal/24hr > removing lipids from TPN 12/23 >>  RD Estimated Needs Total Energy Estimated Needs: 2200-2400kcals Total Protein Estimated Needs: 125-150 g Total Fluid Estimated Needs: 1.8 L  Current Nutrition:  TPN   Plan:  Continue concentrated TPN without lipids at reduced goal rate of 50 ml/hr  > current propofol  rate @ 26 mL/hr = ~ 700 kcal/24hr, removing lipids from TPN  Electrolytes in TPN: Na 0 mEq/L, K 50 mEq/L, Ca 78m Eq/L, Mg 15 mEq/L, Phos 19mmol/L, max CL (ratio is 1:2, required for TPN compatibility) Add standard MVI and trace elements to TPN Continue sensitive SSI Q6H  Monitor TPN labs on Mon/Thurs - labs in AM Tolerating tube feeds, will continue at same rate  today per primary team    Thank you for allowing pharmacy to be a part of this patients care.  Shelba Collier, PharmD, BCPS Clinical Pharmacist

## 2024-12-15 NOTE — Progress Notes (Addendum)
 "  NAME:  Alejandro Lopez, MRN:  [id number], DOB:  03/18/1981, LOS: 22 ADMISSION DATE:  11/22/2024, CONSULTATION DATE:  12/8 REFERRING MD:  Lucas, CHIEF COMPLAINT:  post cardiac surgery critical care services    History of Present Illness:  43 year old male patient with history of hypertension, alcohol and tobacco abuse, presented to the emergency room on 11/30 with approximately 1 month history of progressive dyspnea accompanied by lower extremity swelling extending up to the level of his scrotum and abdomen. Diagnostic evaluation by echocardiogram showed left ventricular ejection fraction 35 to 40% with grade 3 diastolic dysfunction this was further complicated by severe mitral valve regurgitation and aortic insufficiency because of this he was transferred to [facility] for further evaluation. He underwent cardiac catheterization on 12/4: Right heart hemodynamic parameters showed baseline right atrial pressure 5 mmHg, PA pressure 32/17, pulmonary capillary wedge pressure at 14 estimated Fick 4.9 L/min with cardiac index Fick calculated at 2.59 His PAPi was 3 Left heart cath was negative for coronary artery disease Went to OR 12/8 for MVR and AVR w/ impella insertion. PCCM asked to assist w/ post op care   OR course EBL: 1735 Received  Products: cryo 92ml, FFP 401 Also received DDAVP , 2200 crystalloid, 250ml albumin   Cell saver 1125 Pump time 4hrs Clamp time 2hrs 50 min  Events: multiple defibrillations intra-op  Intra-op ECHO EF estimated 40%  Pertinent  Medical History  Tobacco abuse, alcohol abuse, hypertension  Significant Hospital Events: Including procedures, antibiotic start and stop dates in addition to other pertinent events   11/30 admitted/. ECHO 35 to 40% with grade 3 diastolic dysfunction this was further complicated by severe mitral valve regurgitation and aortic insufficiency 12/4 left and right heart cath 12/8 AVR and MVR w/ bioprosthetic valves. Arrived on icu  w/ Impella 5.5 MCS at flow 4 lpm and P 7. Received 2 more PLTs, 2 FFP and 1 cryo in first 6 hrs post op for cont blood loss/oozing. Required NE, epi and milrinone . Good flow on IMPELLA but minimal pulsatility  at P7. Placed on Amio gtt for VT 12/9 received 1 unit PRBC over night for hgb down to 7.5, hgb 8 getting second unit of blood. Still on P7 3.5 lPM. Extubated. 12/11 1L thora, later hemorrhagic shock and hemothorax> IR embolization. Also found to have ischemic bowel and underwent ex-lap with partial resection of ileum. 12/15 plan for ostomy tomorrow, transition epi to levophed   12/16 plan back to OR for ostomy placement  12/17 hemodynamically stable off pressors on Milrinone  0.25 , unable to tolerate SBT, plan for CT chest and heparin  gtt, high fevers>micafungin  12/19 extubated 12/20-12/21 abx to meropenem , aspiration/ agitation/ weakness leading to CO2 retention, RV dysfunction and reintubation, L CVC removed; R CVC placed 12/22: Failed SBT d/t wob 12/23: Bedside tracheostomy today  Interim History / Subjective:  As above  Objective    Blood pressure 114/78, pulse (!) 102, temperature (!) 100.6 F (38.1 C), resp. rate (!) 27, height 5' 10 (1.778 m), weight 73.4 kg, SpO2 97%. CVP:  [6 mmHg-77 mmHg] 9 mmHg  Vent Mode: PRVC FiO2 (%):  [40 %] 40 % Set Rate:  [20 bmp] 20 bmp Vt Set:  [580 mL] 580 mL PEEP:  [5 cmH20] 5 cmH20 Plateau Pressure:  [19 cmH20-25 cmH20] 23 cmH20   Intake/Output Summary (Last 24 hours) at 12/15/2024 0925 Last data filed at 12/15/2024 0900 Gross per 24 hour  Intake 4092.57 ml  Output 4140 ml  Net -47.43 ml  Filed Weights   12/11/24 0500 12/12/24 0500 12/14/24 0358  Weight: 80 kg 76.2 kg 73.4 kg   General: acute on chronically-ill, in NAD, intubated on Precedex  HEENT: AT/[place], PERRL, 3mm bilaterally mmm Pulm: ETT, ventilator-assisted breaths, rhonchi bilaterally, CT @ -20cm H2O w/ ss output in atrium CV: irregularly irregular, impella in place GI:  soft, non distended, ileostomy with pink/patent stoma w/ stool ouput Neuro: GCS11T E4 V1 M6 , intubated on precedex   Patient Lines/Drains/Airways Status     Active Line/Drains/Airways     Name Placement date Placement time Site Days   Arterial Line 12/06/24 Right Radial 12/06/24  --  Radial  9   PICC Triple Lumen 12/04/24 Right Brachial 40 cm 0 cm 12/04/24  1623  -- 11   CVC Triple Lumen 12/13/24 Right Internal jugular 12/13/24  1600  -- 2   Impella 11/30/24  1409  -- 15   Chest Tube 1 Right Pleural 28 Fr. 12/10/24  1951  Pleural  5   NG/OG Vented/Dual Lumen 14 Fr. Left nare Marking at nare/corner of mouth 70 cm 12/14/24  2000  Left nare  1   Ileostomy Standard (end) RLQ 12/08/24  1352  RLQ  7   Urethral Catheter [facility] RN Temperature probe 12/03/24  2345  Temperature probe  12   Airway 7.5 mm 12/13/24  0613  -- 2   Wound 11/30/24 1512 Surgical Closed Surgical Incision Chest Other (Comment) 11/30/24  1512  Chest  15   Wound 11/30/24 1512 Surgical Closed Surgical Incision Chest Right 11/30/24  1512  Chest  15   Wound 12/05/24 1000 Pressure Injury Buttocks Mid Deep Tissue Pressure Injury - Purple or maroon localized area of discolored intact skin or blood-filled blister due to damage of underlying soft tissue from pressure and/or shear. 12/05/24  1000  Buttocks  10   Wound 12/05/24 1033  Back Left;Upper 12/05/24  1033  Back  10   Wound 12/08/24 1408 Surgical Closed Surgical Incision Abdomen Other (Comment) 12/08/24  1408  Abdomen  7           Resolved problem list  AKI Lactic acidosis, resolved Constipation Hyponatremia Assessment and Plan   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 VT; frequent ectopy R pleural effusion> hemothorax post thora, now s/p VATS 12/18 Hypoxic respiratory failure- related to volume overload and  hemothorax, extubated 12/19; reintubated 12/21 due to ileus, aspiration, weakness and recurrent hypercarbia Postop large pericardial effusion- without clear tamponade physiology and unable to drain as appeared fibrinous Fevers, distributive shock- antifungal coverage added to vanc/zosyn  12/17; fever curve broke somewhat; antifungals Dc'd 12/20 as culture neg-restarted 12/22 with positive cultures Ischemic bowel with pneumatosis; s/p partial ileum resection. 12/14 initial resection, 12/16 relook, closure, ileostomy Postop ileus Hyperglycemia; h/o pre-DM. A1c 6.1 H/o tobacco abuse H/o asthma vs COPD Deconditioned state Anxiety/ICU delirium/possible delayed etoh w/d-low suspicion for withdrawal, though has been difficult to fully assess due to prior sedation; continue phenobarb taper  Co-rounded with TCTS:  - Epi stopped 12/22 - Continue nebs - OGT to decompression with high-volume output in 24 hours; ileus improving with ostomy output noted; will eventually need Cortrak for post-pyloric feeds; would start trickle with slow gradual advancement when appropriate - Expect loose stools with ileostomy; monitor for high volume output - BAL +enterobacter cloacae and candida albicans; zosyn  to meropenem  12/21; vanc stopped 12/22; micafungin   started 12/22 - On dobutamine  @ 2.5; coox 83 (73) - Persistent SBT failure d/t wob; plan for bedside tracheostomy today and Impella removal tomorrow 12/24 - Amio, heparin  gtts as ordered; will stop heparin  prior to procedures   sodium chloride  20 mL/hr at 12/11/24 0312   amiodarone  30 mg/hr (12/15/24 0900)   dexmedetomidine  (PRECEDEX ) IV infusion 0.9 mcg/kg/hr (12/15/24 0900)   DOBUTamine  2.5 mcg/kg/min (12/15/24 0900)   epinephrine  Stopped (12/14/24 0745)   heparin  1,800 Units/hr (12/15/24 0900)   meropenem  (MERREM ) IV Stopped (12/15/24 0809)   micafungin  (MYCAMINE ) 100 mg in sodium chloride  0.9 % 100 mL IVPB Stopped (12/14/24 2052)   norepinephrine  (LEVOPHED )  Adult infusion Stopped (12/13/24 0700)   propofol  (DIPRIVAN ) infusion 60 mcg/kg/min (12/15/24 0900)   sodium bicarbonate  25 mEq (Impella PURGE) in dextrose  5 % 1000 mL bag     TPN ADULT (ION) 72 mL/hr at 12/15/24 0900   Critical care time:  33 minutes   CRITICAL CARE Performed by: Britiny Defrain H Rayvin Abid  Critical care time was exclusive of separately billable procedures and treating other patients.  Critical care was necessary to treat or prevent imminent or life-threatening deterioration.  Critical care was time spent personally by me on the following activities: development of treatment plan with patient and/or surrogate as well as nursing, discussions with consultants, evaluation of patient's response to treatment, examination of patient, obtaining history from patient or surrogate, ordering and performing treatments and interventions, ordering and review of laboratory studies, ordering and review of radiographic studies, pulse oximetry and re-evaluation of patient's condition.  "

## 2024-12-15 NOTE — Progress Notes (Signed)
 5 Days Post-Op   Subjective/Chief Complaint: Febrile. Vac alarming periodically with clogged warning.    Objective: Vital signs in last 24 hours: Temp:  [98.2 F (36.8 C)-100.9 F (38.3 C)] 100.4 F (38 C) (12/23 1000) Pulse Rate:  [68-102] 88 (12/23 1000) Resp:  [0-31] 25 (12/23 1000) BP: (99-150)/(70-98) 102/70 (12/23 1000) SpO2:  [96 %-100 %] 100 % (12/23 1000) Arterial Line BP: (95-165)/(61-77) 123/67 (12/23 1000) FiO2 (%):  [40 %-100 %] 100 % (12/23 1022) Last BM Date : 12/14/24  Intake/Output from previous day: 12/22 0701 - 12/23 0700 In: 4078.3 [I.V.:3516.5; IV Piggyback:305] Out: 3725 [Urine:3450; Stool:200; Chest Tube:75] Intake/Output this shift: Total I/O In: 929.2 [I.V.:769.2; Other:60; IV Piggyback:100] Out: 1040 [Urine:590; Emesis/NG output:400; Chest Tube:50]  Alert, cooperative on vent CV - HR low 100's, no extremity edema  Pulm -intubated sedated Ab soft, mild distention, leostomy pink with liquid nonbloody stool in ostomy appliance, vac in place.   Lab Results:  Recent Labs    12/14/24 0501 12/14/24 0518 12/15/24 0430 12/15/24 0437  WBC 16.3*  --  14.1*  --   HGB 8.9*   < > 8.6* 9.2*  HCT 26.2*   < > 25.4* 27.0*  PLT 277  --  317  --    < > = values in this interval not displayed.   BMET Recent Labs    12/14/24 0501 12/14/24 0518 12/15/24 0430 12/15/24 0437  NA 144   < > 144 147*  K 4.9   < > 4.4 4.3  CL 110  --  113*  --   CO2 25  --  23  --   GLUCOSE 136*  --  113*  --   BUN 36*  --  29*  --   CREATININE 0.61  --  0.51*  --   CALCIUM  9.0  --  8.9  --    < > = values in this interval not displayed.   PT/INR No results for input(s): LABPROT, INR in the last 72 hours. ABG Recent Labs    12/14/24 0518 12/15/24 0437  PHART 7.495* 7.454*  HCO3 25.7 24.5    Studies/Results: DG CHEST PORT 1 VIEW Result Date: 12/14/2024 EXAM: 1 VIEW(S) XRAY OF THE CHEST 12/14/2024 08:15:00 PM COMPARISON: Portable chest earlier today at 5:26  am. CLINICAL HISTORY: Pleural effusion. FINDINGS: LINES, TUBES AND DEVICES: Right PICC terminates at the superior cavoatrial junction. Right IJ central line catheter tip is in the upper right atrium. Impella device terminating in the upper left ventricle. ETT tip is 7.1 cm from the carina in the upper thoracic trachea. NGT extends towards the body of the stomach and off of the plane of imaging. Right chest tube positioning is unchanged. LUNGS AND PLEURA: Right chest tube positioning is unchanged. No measurable pneumothorax. Small pleural effusions are again noted. Asymmetric atelectasis or consolidation in the right lower lung field not significantly changed. No significant vascular congestion is seen. There is a chronically elevated right hemidiaphragm. HEART AND MEDIASTINUM: The heart silhouette is enlarged. The mediastinum is stable with sternotomy sutures, MVR, AVR, epicardial pacing wires. BONES AND SOFT TISSUES: Sternotomy sutures. Embolization coils to the right of T10-T11. No acute osseous abnormality. IMPRESSION: 1. Small pleural effusions, unchanged. 2. Right chest tube in place with no measurable pneumothorax. 3. Asymmetric atelectasis or consolidation in the right lower lung field, not significantly changed. 4. Cardiomegaly. Electronically signed by: Francis Quam MD 12/14/2024 08:39 PM EST RP Workstation: [id number]   DG Abd 1 View Result Date:  12/14/2024 EXAM: 1 VIEW XRAY OF THE ABDOMEN 12/14/2024 08:15:00 PM COMPARISON: Chest, abdomen, and pelvis CT 12/09/2024. CLINICAL HISTORY: [id number] Encounter for nasogastric (NG) tube placement [id number] Encounter for nasogastric (NG) tube placement. FINDINGS: LINES, TUBES AND DEVICES: NGT is well inside the stomach with the tip directed to the right in the body of the stomach. BOWEL: The visualized bowel pattern appears nonobstructive. SOFT TISSUES: Suture material in the right mid abdomen. BONES: Embolization coils to the right of T10-T11. No acute fracture.  LIMITATIONS/ARTIFACTS: The pelvis was not included in the study. VISUALIZED CHEST: Chronic elevation of the right hemidiaphragm. The heart is enlarged. Sternotomy sutures, AVR, and MVR are again noted with an Impella device projecting into the left ventricle. There is epicardial pacing wiring. IMPRESSION: 1. NG tube in appropriate position. 2. Nonobstructive bowel gas pattern. Electronically signed by: Francis Quam MD 12/14/2024 08:32 PM EST RP Workstation: [id number]   DG Chest Port 1 View Result Date: 12/14/2024 CLINICAL DATA:  Ventilator dependence. EXAM: PORTABLE CHEST 1 VIEW COMPARISON:  12/13/2024 FINDINGS: Low volumes. The cardio pericardial silhouette is enlarged. Interval progression of right base collapse/consolidation with probable effusion. Endotracheal tube tip is 4.3 cm above the base of the carina. NG tube tip is in the stomach with proximal side port positioned at the level of the distal esophagus. Impella device is stable in position. Right IJ central line tip overlies the mid to lower SVC level. Right PICC line tip overlies the upper right atrium. Right chest tube remains in place. Trace right apical pneumothorax seen on the previous study not evident on the current exam. IMPRESSION: 1. Interval progression of right base collapse/consolidation with probable effusion. 2. Trace right apical pneumothorax seen on the previous study not evident on the current exam. 3. NG tube tip is in the stomach with proximal side port positioned at the level of the distal esophagus. Electronically Signed   By: Camellia Candle M.D.   On: 12/14/2024 06:54   DG Chest Port 1 View Result Date: 12/13/2024 CLINICAL DATA:  Central line placement. EXAM: PORTABLE CHEST 1 VIEW COMPARISON:  Radiograph earlier today FINDINGS: New right internal jugular central venous catheter tip overlies the mid lower SVC. Previous weighted enteric tube is been removed, there is a new non weighted enteric tube with tip below the  diaphragm, side-port in the distal esophagus. Stable remaining support apparatus include Impella device, right upper extremity PICC, enteric tube and left internal jugular sheath. Right-sided chest tube with tip directed towards the apex. Post median sternotomy with prosthetic cardiac valves. Stable heart size and mediastinal contours. Trace right apical pneumothorax. Improved right lung base aeration. IMPRESSION: 1. New right internal jugular central venous catheter tip overlies the mid lower SVC. 2. New non weighted enteric tube with tip below the diaphragm, side-port in the distal esophagus. Recommend advancement. 3. Trace right apical pneumothorax with chest tube in place. 4. Mildly improved right lung base aeration. Electronically Signed   By: Andrea Gasman M.D.   On: 12/13/2024 16:24    Anti-infectives: Anti-infectives (From admission, onward)    Start     Dose/Rate Route Frequency Ordered Stop   12/14/24 1830  micafungin  (MYCAMINE ) 200 mg in sodium chloride  0.9 % 100 mL IVPB  Status:  Discontinued        200 mg 110 mL/hr over 1 Hours Intravenous  Once 12/14/24 1738 12/14/24 1743   12/14/24 1830  micafungin  (MYCAMINE ) 100 mg in sodium chloride  0.9 % 100 mL IVPB  100 mg 105 mL/hr over 1 Hours Intravenous Every 24 hours 12/14/24 1738     12/13/24 2000  vancomycin  (VANCOREADY) IVPB 1500 mg/300 mL  Status:  Discontinued        1,500 mg 150 mL/hr over 120 Minutes Intravenous Every 24 hours 12/13/24 1237 12/14/24 1418   12/13/24 0720  vancomycin  variable dose per unstable renal function (pharmacist dosing)  Status:  Discontinued         Does not apply See admin instructions 12/13/24 0720 12/13/24 1237   12/12/24 1630  meropenem  (MERREM ) 1 g in sodium chloride  0.9 % 100 mL IVPB        1 g 200 mL/hr over 30 Minutes Intravenous Every 8 hours 12/12/24 1534     12/10/24 1000  micafungin  (MYCAMINE ) 100 mg in sodium chloride  0.9 % 100 mL IVPB  Status:  Discontinued       Placed in Followed  by Linked Group   100 mg 105 mL/hr over 1 Hours Intravenous Every 24 hours 12/09/24 0704 12/11/24 0710   12/09/24 0830  micafungin  (MYCAMINE ) 200 mg in sodium chloride  0.9 % 100 mL IVPB       Placed in Followed by Linked Group   200 mg 110 mL/hr over 1 Hours Intravenous  Once 12/09/24 0704 12/09/24 1101   12/06/24 2200  vancomycin  (VANCOREADY) IVPB 1250 mg/250 mL  Status:  Discontinued        1,250 mg 166.7 mL/hr over 90 Minutes Intravenous Every 12 hours 12/06/24 0811 12/13/24 0709   12/06/24 0900  vancomycin  (VANCOREADY) IVPB 1750 mg/350 mL        1,750 mg 175 mL/hr over 120 Minutes Intravenous  Once 12/06/24 0802 12/06/24 1032   12/04/24 1100  vancomycin  (VANCOCIN ) IVPB 1000 mg/200 mL premix  Status:  Discontinued        1,000 mg 200 mL/hr over 60 Minutes Intravenous Every 12 hours 12/03/24 2238 12/03/24 2242   12/04/24 0000  vancomycin  (VANCOREADY) IVPB 1500 mg/300 mL  Status:  Discontinued        1,500 mg 150 mL/hr over 120 Minutes Intravenous  Once 12/03/24 2236 12/03/24 2242   12/03/24 2300  piperacillin -tazobactam (ZOSYN ) IVPB 3.375 g  Status:  Discontinued        3.375 g 12.5 mL/hr over 240 Minutes Intravenous Every 8 hours 12/03/24 2236 12/12/24 1534   11/30/24 2130  vancomycin  (VANCOCIN ) IVPB 1000 mg/200 mL premix        1,000 mg 200 mL/hr over 60 Minutes Intravenous  Once 11/30/24 1609 11/30/24 2351   11/30/24 1615  ceFAZolin  (ANCEF ) IVPB 2g/100 mL premix        2 g 200 mL/hr over 30 Minutes Intravenous Every 8 hours 11/30/24 1609 12/02/24 0546   11/30/24 0400  vancomycin  (VANCOREADY) IVPB 1250 mg/250 mL        1,250 mg 166.7 mL/hr over 90 Minutes Intravenous To Surgery 11/29/24 1040 11/30/24 0856   11/30/24 0400  ceFAZolin  (ANCEF ) IVPB 2g/100 mL premix        2 g 200 mL/hr over 30 Minutes Intravenous To Surgery 11/29/24 1040 11/30/24 1239   11/30/24 0400  ceFAZolin  (ANCEF ) IVPB 2g/100 mL premix  Status:  Discontinued        2 g 200 mL/hr over 30 Minutes Intravenous  To Surgery 11/29/24 1036 11/30/24 1609       Assessment/Plan: 43 y/o M S/P AVR, MCR, Impella placement by Dr. Lucas 12/8   S/P right thoracentesis 12/11 with hemorrhage from intercostal -chest tube  placed by TCTS.  S/p emergent IR embolizaton  R intercostal artery by Dr. Philip.   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   - could try tube feeds again.  If so, would stay on trickle levels at 10 ml/hr for at least 24 hours.    FEN: NPO, NG to LIWS, IVF per primary ID: Zosyn , Micafungin , Vanc; resp cx 12/18 NGTD, BCx 12/17 no growth 3 days VTE: heparin  gtt Foley: in place Dispo: ICU. Trach planned tomorrow.  Impella removal planned the following day.   Discussed at bedside with RN. Discussed with CT surg team.    Jina LITTIE Nephew, MD, FACS, FSSO Surgical Oncology, General Surgery, Trauma and Critical Care [facility] Surgery, [place] [phone] for weekday/non holidays Check [email or web address] for coverage night/weekend/holidays under General Surgery

## 2024-12-15 NOTE — Procedures (Signed)
 Diagnostic Bronchoscopy  Alejandro Lopez  [id number]  1981-01-13  Date:12/15/2024  Time:11:00  Provider Performing:Madelein Mahadeo H Randen Kauth   Procedure: Diagnostic Bronchoscopy (68377)  Indication(s) Assist with direct visualization of tracheostomy placement  Consent Risks of the procedure as well as the alternatives and risks of each were explained to the patient and/or caregiver. Consent for the procedure was obtained.   Anesthesia See separate tracheostomy note   Time Out Verified patient identification, verified procedure, site/side was marked, verified correct patient position, special equipment/implants available, medications/allergies/relevant history reviewed, required imaging and test results available.   Sterile Technique Usual hand hygiene, masks, gowns, and gloves were used   Procedure Description Bronchoscope advanced through endotracheal tube and into airway. Airways were examined down to subsegmental level with findings noted below. Bronchoscope advanced into right mainstem bronchus. All segments were visualized: upper, middle, lower lobes. Moderate thin, turbid secretions were noted. No focal lesions, erythema, inflammation were noted. Bronchoalveolar lavage performed in right middle lobe. 30ml aliquot of sterile saline was instilled with return of thin turbid secretions with green/tan particulate debris. Sample collected in sterile specimen containers. Examination of the left lung was then performed to complete visualization of the airways. Bronchoscope advanced into the left mainstem bronchus. All segments were visualized: upper, middle, lower lobes. Minimal thin, turbid secretions noted. No focal lesions, inflammation were noted. My attention was then directed to tracheostomy placement. After suctioning copious thick tan tracheal secretions, bronchoscope used to provide direct visualization of tracheostomy placement.  Complications/Tolerance None; patient tolerated  the procedure well.  EBL None  Specimen(s) Right lung  Warren Shade, DNP, AGACNP-BC  Pulmonary & Critical Care  Please see [email or web address] for pager details.  From 7A-7P if no response, please call [phone]. After hours, please call ELink [phone].

## 2024-12-16 ENCOUNTER — Inpatient Hospital Stay (HOSPITAL_COMMUNITY)

## 2024-12-16 ENCOUNTER — Inpatient Hospital Stay (HOSPITAL_COMMUNITY): Payer: Self-pay | Admitting: Anesthesiology

## 2024-12-16 ENCOUNTER — Encounter (HOSPITAL_COMMUNITY)
Admission: EM | Disposition: A | Discharge status: Rehabilitation Facility | Payer: Self-pay | Source: Home / Self Care | Attending: Surgery

## 2024-12-16 DIAGNOSIS — Z4509 Encounter for adjustment and management of other cardiac device: Secondary | ICD-10-CM

## 2024-12-16 DIAGNOSIS — I110 Hypertensive heart disease with heart failure: Secondary | ICD-10-CM

## 2024-12-16 DIAGNOSIS — I5021 Acute systolic (congestive) heart failure: Secondary | ICD-10-CM

## 2024-12-16 DIAGNOSIS — F17210 Nicotine dependence, cigarettes, uncomplicated: Secondary | ICD-10-CM | POA: Diagnosis not present

## 2024-12-16 DIAGNOSIS — Z930 Tracheostomy status: Secondary | ICD-10-CM

## 2024-12-16 DIAGNOSIS — R6521 Severe sepsis with septic shock: Secondary | ICD-10-CM

## 2024-12-16 DIAGNOSIS — G928 Other toxic encephalopathy: Secondary | ICD-10-CM

## 2024-12-16 DIAGNOSIS — A419 Sepsis, unspecified organism: Secondary | ICD-10-CM

## 2024-12-16 HISTORY — PX: REMOVAL OF IMPELLA LEFT VENTRICULAR ASSIST DEVICE: SHX6556

## 2024-12-16 LAB — POCT I-STAT 7, (LYTES, BLD GAS, ICA,H+H)
Acid-Base Excess: 1 mmol/L (ref 0.0–2.0)
Acid-base deficit: 1 mmol/L (ref 0.0–2.0)
Bicarbonate: 25.6 mmol/L (ref 20.0–28.0)
Bicarbonate: 23.4 mmol/L (ref 20.0–28.0)
Calcium, Ion: 1.26 mmol/L (ref 1.15–1.40)
Calcium, Ion: 1.29 mmol/L (ref 1.15–1.40)
HCT: 25 % — ABNORMAL LOW (ref 39.0–52.0)
HCT: 26 % — ABNORMAL LOW (ref 39.0–52.0)
Hemoglobin: 8.5 g/dL — ABNORMAL LOW (ref 13.0–17.0)
Hemoglobin: 8.8 g/dL — ABNORMAL LOW (ref 13.0–17.0)
O2 Saturation: 99 %
O2 Saturation: 99 %
Patient temperature: 37.9
Patient temperature: 37.2
Potassium: 5.4 mmol/L — ABNORMAL HIGH (ref 3.5–5.1)
Potassium: 4.9 mmol/L (ref 3.5–5.1)
Sodium: 144 mmol/L (ref 135–145)
Sodium: 144 mmol/L (ref 135–145)
TCO2: 27 mmol/L (ref 22–32)
TCO2: 24 mmol/L (ref 22–32)
pCO2 arterial: 40 mmHg (ref 32–48)
pCO2 arterial: 37 mmHg (ref 32–48)
pH, Arterial: 7.419 (ref 7.35–7.45)
pH, Arterial: 7.409 (ref 7.35–7.45)
pO2, Arterial: 152 mmHg — ABNORMAL HIGH (ref 83–108)
pO2, Arterial: 150 mmHg — ABNORMAL HIGH (ref 83–108)

## 2024-12-16 LAB — CBC WITH DIFFERENTIAL/PLATELET
Abs Immature Granulocytes: 0.48 K/uL — ABNORMAL HIGH (ref 0.00–0.07)
Basophils Absolute: 0.1 K/uL (ref 0.0–0.1)
Basophils Relative: 1 %
Eosinophils Absolute: 0.2 K/uL (ref 0.0–0.5)
Eosinophils Relative: 2 %
HCT: 25.6 % — ABNORMAL LOW (ref 39.0–52.0)
Hemoglobin: 8.5 g/dL — ABNORMAL LOW (ref 13.0–17.0)
Immature Granulocytes: 4 %
Lymphocytes Relative: 17 %
Lymphs Abs: 2.1 K/uL (ref 0.7–4.0)
MCH: 30 pg (ref 26.0–34.0)
MCHC: 33.2 g/dL (ref 30.0–36.0)
MCV: 90.5 fL (ref 80.0–100.0)
Monocytes Absolute: 0.9 K/uL (ref 0.1–1.0)
Monocytes Relative: 7 %
Neutro Abs: 8.3 K/uL — ABNORMAL HIGH (ref 1.7–7.7)
Neutrophils Relative %: 69 %
Platelets: 352 K/uL (ref 150–400)
RBC: 2.83 MIL/uL — ABNORMAL LOW (ref 4.22–5.81)
RDW: 18.8 % — ABNORMAL HIGH (ref 11.5–15.5)
Smear Review: NORMAL
WBC: 12 K/uL — ABNORMAL HIGH (ref 4.0–10.5)
nRBC: 0.5 % — ABNORMAL HIGH (ref 0.0–0.2)

## 2024-12-16 LAB — BASIC METABOLIC PANEL WITH GFR
Anion gap: 6 (ref 5–15)
BUN: 24 mg/dL — ABNORMAL HIGH (ref 6–20)
CO2: 24 mmol/L (ref 22–32)
Calcium: 8.9 mg/dL (ref 8.9–10.3)
Chloride: 113 mmol/L — ABNORMAL HIGH (ref 98–111)
Creatinine, Ser: 0.59 mg/dL — ABNORMAL LOW (ref 0.61–1.24)
GFR, Estimated: >60 mL/min
Glucose, Bld: 107 mg/dL — ABNORMAL HIGH (ref 70–99)
Potassium: 4.8 mmol/L (ref 3.5–5.1)
Sodium: 143 mmol/L (ref 135–145)

## 2024-12-16 LAB — COOXEMETRY PANEL
Carboxyhemoglobin: 1.5 % (ref 0.5–1.5)
Methemoglobin: <0.7 % (ref 0.0–1.5)
O2 Saturation: 80.5 %
Total hemoglobin: 9.5 g/dL — ABNORMAL LOW (ref 12.0–16.0)

## 2024-12-16 LAB — PHOSPHORUS: Phosphorus: 4.3 mg/dL (ref 2.5–4.6)

## 2024-12-16 LAB — TRIGLYCERIDES: Triglycerides: 238 mg/dL — ABNORMAL HIGH

## 2024-12-16 LAB — GLUCOSE, CAPILLARY
Glucose-Capillary: 107 mg/dL — ABNORMAL HIGH (ref 70–99)
Glucose-Capillary: 112 mg/dL — ABNORMAL HIGH (ref 70–99)
Glucose-Capillary: 106 mg/dL — ABNORMAL HIGH (ref 70–99)
Glucose-Capillary: 107 mg/dL — ABNORMAL HIGH (ref 70–99)

## 2024-12-16 LAB — MAGNESIUM: Magnesium: 1.9 mg/dL (ref 1.7–2.4)

## 2024-12-16 LAB — LACTATE DEHYDROGENASE: LDH: 766 U/L — ABNORMAL HIGH (ref 105–235)

## 2024-12-16 LAB — HEPARIN LEVEL (UNFRACTIONATED): Heparin Unfractionated: 0.27 [IU]/mL — ABNORMAL LOW (ref 0.30–0.70)

## 2024-12-16 MED ORDER — FENTANYL CITRATE (PF) 100 MCG/2ML IJ SOLN
INTRAMUSCULAR | Status: AC
  Filled 2024-12-16: qty 2

## 2024-12-16 MED ORDER — FENTANYL CITRATE (PF) 100 MCG/2ML IJ SOLN
INTRAMUSCULAR | Status: DC | PRN
  Administered 2024-12-16 (×2): 50 ug via INTRAVENOUS

## 2024-12-16 MED ORDER — CALCIUM CHLORIDE 10 % IV SOLN
INTRAVENOUS | Status: AC
  Filled 2024-12-16: qty 10

## 2024-12-16 MED ORDER — PROPOFOL 10 MG/ML IV BOLUS
INTRAVENOUS | Status: AC
  Filled 2024-12-16: qty 20

## 2024-12-16 MED ORDER — VITAL 1.5 CAL PO LIQD
1000.0000 mL | ORAL | Status: DC
  Administered 2024-12-16: 1000 mL
  Filled 2024-12-16: qty 1000

## 2024-12-16 MED ORDER — VITAL HP 1.0 CAL PO LIQD: 1000.0000 mL | ORAL | Status: DC

## 2024-12-16 MED ORDER — TRACE MINERALS CU-MN-SE-ZN 300-55-60-3000 MCG/ML IV SOLN
INTRAVENOUS | Status: AC
  Filled 2024-12-16: qty 800

## 2024-12-16 MED ORDER — MIDAZOLAM HCL 2 MG/2ML IJ SOLN
INTRAMUSCULAR | Status: AC
  Filled 2024-12-16: qty 2

## 2024-12-16 MED ORDER — EPINEPHRINE 1 MG/10ML IV SOSY
PREFILLED_SYRINGE | INTRAVENOUS | Status: AC
  Filled 2024-12-16: qty 10

## 2024-12-16 MED ORDER — ROCURONIUM BROMIDE 10 MG/ML (PF) SYRINGE
PREFILLED_SYRINGE | INTRAVENOUS | Status: DC | PRN
  Administered 2024-12-16: 60 mg via INTRAVENOUS
  Administered 2024-12-16 (×2): 20 mg via INTRAVENOUS
  Administered 2024-12-16: 30 mg via INTRAVENOUS

## 2024-12-16 MED ORDER — LACTATED RINGERS IV SOLN: INTRAVENOUS | Status: DC | PRN

## 2024-12-16 MED ORDER — MAGNESIUM SULFATE 2 GM/50ML IV SOLN
2.0000 g | Freq: Once | INTRAVENOUS | Status: AC
  Administered 2024-12-16: 2 g via INTRAVENOUS
  Filled 2024-12-16: qty 50

## 2024-12-16 MED ORDER — MIDAZOLAM HCL (PF) 2 MG/2ML IJ SOLN
INTRAMUSCULAR | Status: DC | PRN
  Administered 2024-12-16: 2 mg via INTRAVENOUS

## 2024-12-16 NOTE — Progress Notes (Signed)
 EVENING ROUNDS NOTE :     [address] [place] [address]             [phone]               * Day of Surgery * Procedures (LRB): REMOVAL, CARDIAC ASSIST DEVICE, IMPELLA (Right)   Total Length of Stay:  LOS: 23 days  Events:   No events Low dose dob     BP 112/75   Pulse (!) 110   Temp (!) 100.6 F (38.1 C)   Resp (!) 27   Ht 5' 10 (1.778 m)   Wt 79.3 kg   SpO2 100%   BMI 25.08 kg/m   CVP:  [7 mmHg-14 mmHg] 7 mmHg  Vent Mode: PRVC FiO2 (%):  [40 %-60 %] 40 % Set Rate:  [20 bmp] 20 bmp Vt Set:  [580 mL] 580 mL PEEP:  [5 cmH20] 5 cmH20 Plateau Pressure:  [22 cmH20-23 cmH20] 23 cmH20   sodium chloride  20 mL/hr at 12/11/24 0312   amiodarone  30 mg/hr (12/16/24 1800)   ceFEPime  (MAXIPIME ) IV Stopped (12/16/24 1453)   DOBUTamine  2.5 mcg/kg/min (12/16/24 1800)   feeding supplement (VITAL 1.5 CAL) 10 mL/hr at 12/16/24 1800   heparin  Stopped (12/16/24 0537)   micafungin  (MYCAMINE ) 100 mg in sodium chloride  0.9 % 100 mL IVPB Stopped (12/16/24 1757)   propofol  (DIPRIVAN ) infusion 20 mcg/kg/min (12/16/24 1800)   TPN ADULT (ION) 50 mL/hr at 12/16/24 1811    I/O last 3 completed shifts: In: 49 [I.V.:3923.2; Other:276; NG/GT:38.8; IV Piggyback:760] Out: 5885 [Urine:4255; Emesis/NG output:800; Drains:50; Stool:600; Blood:20; Chest Tube:160]      Latest Ref Rng & Units 12/16/2024   11:56 AM 12/16/2024    5:29 AM 12/16/2024    5:17 AM  CBC  WBC 4.0 - 10.5 K/uL   12.0   Hemoglobin 13.0 - 17.0 g/dL 8.8  8.5  8.5   Hematocrit 39.0 - 52.0 % 26.0  25.0  25.6   Platelets 150 - 400 K/uL   352        Latest Ref Rng & Units 12/16/2024   11:56 AM 12/16/2024    5:29 AM 12/16/2024    5:17 AM  BMP  Glucose 70 - 99 mg/dL   892   BUN 6 - 20 mg/dL   24   Creatinine 9.38 - 1.24 mg/dL   9.40   Sodium 864 - 854 mmol/L 144  144  143   Potassium 3.5 - 5.1 mmol/L 4.9  5.4  4.8   Chloride 98 - 111 mmol/L   113   CO2 22 - 32 mmol/L   24   Calcium   8.9 - 10.3 mg/dL   8.9     ABG    Component Value Date/Time   PHART 7.409 12/16/2024 1156   PCO2ART 37.0 12/16/2024 1156   PO2ART 150 (H) 12/16/2024 1156   HCO3 23.4 12/16/2024 1156   TCO2 24 12/16/2024 1156   ACIDBASEDEF 1.0 12/16/2024 1156   O2SAT 99 12/16/2024 1156       Alejandro Rayas, MD 12/16/2024 8:58 PM

## 2024-12-16 NOTE — Plan of Care (Signed)
  Problem: Education: Goal: Knowledge of General Education information will improve Description: Including pain rating scale, medication(s)/side effects and non-pharmacologic comfort measures Outcome: Progressing   Problem: Health Behavior/Discharge Planning: Goal: Ability to manage health-related needs will improve Outcome: Progressing   Problem: Clinical Measurements: Goal: Ability to maintain clinical measurements within normal limits will improve Outcome: Progressing Goal: Diagnostic test results will improve Outcome: Progressing Goal: Respiratory complications will improve Outcome: Progressing Goal: Cardiovascular complication will be avoided Outcome: Progressing   Problem: Nutrition: Goal: Adequate nutrition will be maintained Outcome: Progressing   Problem: Coping: Goal: Level of anxiety will decrease Outcome: Progressing   Problem: Elimination: Goal: Will not experience complications related to bowel motility Outcome: Progressing Goal: Will not experience complications related to urinary retention Outcome: Progressing   Problem: Pain Managment: Goal: General experience of comfort will improve and/or be controlled Outcome: Progressing   Problem: Safety: Goal: Ability to remain free from injury will improve Outcome: Progressing   Problem: Skin Integrity: Goal: Risk for impaired skin integrity will decrease Outcome: Progressing

## 2024-12-16 NOTE — Progress Notes (Signed)
 ANTICOAGULATION CONSULT NOTE  Pharmacy Consult for heparin  Indication: bA/MVR  Allergies[1]  Patient Measurements: Height: 5' 10 (177.8 cm) Weight: 73.4 kg (161 lb 13.1 oz) IBW/kg (Calculated) : 73 Heparin  Dosing Weight: 70 kg   Vital Signs: Temp: 101.1 F (38.4 C) (12/24 0000) BP: 119/83 (12/24 0000) Pulse Rate: 86 (12/24 0000)  Labs: Recent Labs    12/13/24 0408 12/13/24 0520 12/13/24 1543 12/13/24 1807 12/14/24 0501 12/14/24 0518 12/14/24 2008 12/15/24 0013 12/15/24 0430 12/15/24 0437  HGB 9.9*   < >  --    < > 8.9* 8.5*  --   --  8.6* 9.2*  HCT 30.3*   < >  --    < > 26.2* 25.0*  --   --  25.4* 27.0*  PLT 283  --   --   --  277  --   --   --  317  --   HEPARINUNFRC 0.23*  --   --    < > 0.23*  --  0.19* 0.27* 0.12*  --   CREATININE 0.98   < > 0.74  --  0.61  --   --   --  0.51*  --    < > = values in this interval not displayed.    Estimated Creatinine Clearance: 120.4 mL/min (A) (by C-G formula based on SCr of 0.51 mg/dL (L)).  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  Postop course complicated by ischemic bowel s/p exlap 12/11 with partial small bowel resection, abdomen left open.  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   Per Dr. Lucas, plan for 3 months of warfarin therapy with dual valve replacement. Not on anticoagulation prior to admission.  Pharmacy consulted for heparin  dosing.  Impella 5.5 remains in place. Fixed heparin  500 units/h restarted postop 12/17, then heparin  increased 12/19 to low range therapeutic goal with slow titration.  S/p resume of heparin  after trach placement >>Heparin  level 0.27 after rate increase to 1800 units/hr (subtherapeutic), now trending up towards goal. No issues with line (running through  PICC just fine) or bleeding reported per RN. CBC stable. Impella stable at P4  Goal of Therapy:  HL 0.3-0.5 units/ml Monitor platelets by anticoagulation protocol: Yes   Plan:  Increase IV heparin  to 1900 units/hr. Na bicarb purge for Impella. Plans to turn heparin  off at 0600 for impella removal on 12/24  Lynwood Poplar, PharmD, BCPS Clinical Pharmacist 12/16/2024 12:39 AM     [1] No Known Allergies

## 2024-12-16 NOTE — Progress Notes (Signed)
 "  NAME:  Alejandro Lopez, MRN:  [id number], DOB:  Nov 10, 1981, LOS: 23 ADMISSION DATE:  11/22/2024, CONSULTATION DATE:  12/8 REFERRING MD:  Lucas, CHIEF COMPLAINT:  post cardiac surgery critical care services    History of Present Illness:  43 year old male patient with history of hypertension, alcohol and tobacco abuse, presented to the emergency room on 11/30 with approximately 1 month history of progressive dyspnea accompanied by lower extremity swelling extending up to the level of his scrotum and abdomen. Diagnostic evaluation by echocardiogram showed left ventricular ejection fraction 35 to 40% with grade 3 diastolic dysfunction this was further complicated by severe mitral valve regurgitation and aortic insufficiency because of this he was transferred to [facility] for further evaluation. He underwent cardiac catheterization on 12/4: Right heart hemodynamic parameters showed baseline right atrial pressure 5 mmHg, PA pressure 32/17, pulmonary capillary wedge pressure at 14 estimated Fick 4.9 L/min with cardiac index Fick calculated at 2.59 His PAPi was 3 Left heart cath was negative for coronary artery disease Went to OR 12/8 for MVR and AVR w/ impella insertion. PCCM asked to assist w/ post op care   OR course EBL: 1735 Received  Products: cryo 92ml, FFP 401 Also received DDAVP , 2200 crystalloid, 250ml albumin   Cell saver 1125 Pump time 4hrs Clamp time 2hrs 50 min  Events: multiple defibrillations intra-op  Intra-op ECHO EF estimated 40%  Pertinent  Medical History  Tobacco abuse, alcohol abuse, hypertension  Significant Hospital Events: Including procedures, antibiotic start and stop dates in addition to other pertinent events   11/30 admitted/. ECHO 35 to 40% with grade 3 diastolic dysfunction this was further complicated by severe mitral valve regurgitation and aortic insufficiency 12/4 left and right heart cath 12/8 AVR and MVR w/ bioprosthetic valves. Arrived on icu  w/ Impella 5.5 MCS at flow 4 lpm and P 7. Received 2 more PLTs, 2 FFP and 1 cryo in first 6 hrs post op for cont blood loss/oozing. Required NE, epi and milrinone . Good flow on IMPELLA but minimal pulsatility  at P7. Placed on Amio gtt for VT 12/9 received 1 unit PRBC over night for hgb down to 7.5, hgb 8 getting second unit of blood. Still on P7 3.5 lPM. Extubated. 12/11 1L thora, later hemorrhagic shock and hemothorax> IR embolization. Also found to have ischemic bowel and underwent ex-lap with partial resection of ileum. 12/15 plan for ostomy tomorrow, transition epi to levophed   12/16 plan back to OR for ostomy placement  12/17 hemodynamically stable off pressors on Milrinone  0.25 , unable to tolerate SBT, plan for CT chest and heparin  gtt, high fevers>micafungin  12/19 extubated 12/20-12/21 abx to meropenem , aspiration/ agitation/ weakness leading to CO2 retention, RV dysfunction and reintubation, L CVC removed; R CVC placed 12/22: Failed SBT d/t wob 12/23: Bedside tracheostomy   Interim History / Subjective:  Patient underwent bedside tracheostomy yesterday, tolerated well Today he had Impella removed in the OR Continued to spike fever, Tmax 101.1 Remain off pressors  Objective    Blood pressure (!) 141/85, pulse 76, temperature 98.6 F (37 C), resp. rate (!) 30, height 5' 10 (1.778 m), weight 79.3 kg, SpO2 100%. CVP:  [10 mmHg-15 mmHg] 10 mmHg  Vent Mode: PRVC FiO2 (%):  [40 %-60 %] 60 % Set Rate:  [20 bmp] 20 bmp Vt Set:  [580 mL] 580 mL PEEP:  [5 cmH20] 5 cmH20 Plateau Pressure:  [22 cmH20-24 cmH20] 22 cmH20   Intake/Output Summary (Last 24 hours) at 12/16/2024 1029 Last  data filed at 12/16/2024 1000 Gross per 24 hour  Intake 3481.72 ml  Output 3615 ml  Net -133.28 ml   Filed Weights   12/12/24 0500 12/14/24 0358 12/16/24 0500  Weight: 76.2 kg 73.4 kg 79.3 kg     Physical exam: General: Crtitically ill-appearing middle-aged male, orally intubated HEENT: [place]/AT, eyes  anicteric.  Status post trach, OGT in place Neuro: Eyes closed, does not open, generalized weak Chest: Coarse breath sounds, no wheezes or rhonchi Heart: Regular rate and rhythm, no murmurs or gallops Abdomen: Laparotomy incision looks clean and dry, wound VAC in place, ostomy with dark liquid  Labs and images reviewed  Patient Lines/Drains/Airways Status     Active Line/Drains/Airways     Name Placement date Placement time Site Days   Arterial Line 12/06/24 Right Radial 12/06/24  --  Radial  10   PICC Triple Lumen 12/04/24 Right Brachial 40 cm 0 cm 12/04/24  1623  -- 12   CVC Triple Lumen 12/13/24 Right Internal jugular 12/13/24  1600  -- 3   Chest Tube 1 Right Pleural 28 Fr. 12/10/24  1951  Pleural  6   NG/OG Vented/Dual Lumen 14 Fr. Left nare Marking at nare/corner of mouth 70 cm 12/14/24  2000  Left nare  2   Ileostomy Standard (end) RLQ 12/08/24  1352  RLQ  8   Urethral Catheter Jeronimo Cruz Reyes RN Temperature probe 12/03/24  2345  Temperature probe  13   Tracheostomy Shiley Flexible 6 mm Cuffed 12/15/24  1143  6 mm  1   Wound 11/30/24 1512 Surgical Closed Surgical Incision Chest Other (Comment) 11/30/24  1512  Chest  16   Wound 11/30/24 1512 Surgical Closed Surgical Incision Chest Right 11/30/24  1512  Chest  16   Wound 12/05/24 1000 Pressure Injury Buttocks Mid Deep Tissue Pressure Injury - Purple or maroon localized area of discolored intact skin or blood-filled blister due to damage of underlying soft tissue from pressure and/or shear. 12/05/24  1000  Buttocks  11   Wound 12/05/24 1033  Back Left;Upper 12/05/24  1033  Back  11   Wound 12/08/24 1408 Surgical Closed Surgical Incision Abdomen Other (Comment) 12/08/24  1408  Abdomen  8            Resolved problem list  AKI Lactic acidosis, resolved Constipation Hyponatremia Assessment and Plan  Acute biventricular HFrEF with cardiogenic shock status post Impella, removed on 12/24 Severe mitral regurgitation and aortic  insufficiency status post bioprosthetic mitral valve and aortic valve replacement Nonsustained VT's, resolved Right-sided hemothorax status post IR embolization and now status post VATS Acute respiratory failure with hypoxia and hypercapnia status post trach Sepsis with septic shock due to recurrent aspiration pneumonia with Enterobacter Moderate circumferential pericardial effusion Ischemic bowel status post laparotomy with partial ileum resection end ileostomy creation Postop ileus Acute septic encephalopathy Shock liver Hypertriglyceridemia due to propofol  infusion Alcohol abuse Anemia of critical illness  Heart failure and TCTS teams following Impella was removed today Came off of dobutamine  [place]ox is 80% this morning Continue amiodarone  infusion Resume heparin  infusion per TCTS Monitor chest tube output, output was 130 cc in last 24 hours Continue lung protective ventilation Hypercapnia has cleared VAP prevention bundle in place Continue antibiotic to complete 7 days therapy, currently on cefepime  Patient has been spiking fever could be due to Precedex , will stop Precedex  now Currently on propofol , will titrate down He was put on pressure support trial, currently on 12/5, he is tachypneic but so  far tidal volumes are acceptable Shock is resolved White count is trending down LFTs are stable, repeat in the morning Triglyceride level trended down from 356 down to 238 Titrate propofol  Continue Seroquel  General Surgery is following, recommend starting trickle tube feed and do not titrate Continue TPN Continue phenobarb for alcohol withdrawal Continue thiamine  Monitor H&H and transfuse if less than 8    sodium chloride  20 mL/hr at 12/11/24 0312   amiodarone  30 mg/hr (12/16/24 0726)   ceFEPime  (MAXIPIME ) IV Stopped (12/16/24 0604)   dexmedetomidine  (PRECEDEX ) IV infusion 0.9 mcg/kg/hr (12/16/24 1000)   DOBUTamine  2.5 mcg/kg/min (12/16/24 0726)   feeding supplement (VITAL 1.5  CAL)     heparin  Stopped (12/16/24 0537)   magnesium  sulfate bolus IVPB 50 mL/hr at 12/16/24 1000   micafungin  (MYCAMINE ) 100 mg in sodium chloride  0.9 % 100 mL IVPB Stopped (12/15/24 1850)   norepinephrine  (LEVOPHED ) Adult infusion Stopped (12/13/24 0700)   propofol  (DIPRIVAN ) infusion 65 mcg/kg/min (12/16/24 1000)   sodium bicarbonate  25 mEq (Impella PURGE) in dextrose  5 % 1000 mL bag     TPN ADULT (ION) 50 mL/hr at 12/16/24 0700   TPN ADULT (ION)      The patient is critically ill due to acute respiratory failure with hypoxia and hypercapnia/septic/cardiogenic shock.  Critical care was necessary to treat or prevent imminent or life-threatening deterioration.  Critical care was time spent personally by me on the following activities: development of treatment plan with patient and/or surrogate as well as nursing, discussions with consultants, evaluation of patient's response to treatment, examination of patient, obtaining history from patient or surrogate, ordering and performing treatments and interventions, ordering and review of laboratory studies, ordering and review of radiographic studies, pulse oximetry, re-evaluation of patient's condition and participation in multidisciplinary rounds.   During this encounter critical care time was devoted to patient care services described in this note for 42 minutes.     Valinda Novas, MD [facility] See Amion for pager If no response to pager, please call [phone] until 7pm After 7pm, Please call E-link [phone]  "

## 2024-12-16 NOTE — Interval H&P Note (Signed)
 History and Physical Interval Note:  12/16/2024 6:38 AM  Alejandro Lopez  has presented today for surgery, with the diagnosis of pericardial effusion.  The various methods of treatment have been discussed with the patient and family. After consideration of risks, benefits and other options for treatment, the patient has consented to  Procedures: REMOVAL, CARDIAC ASSIST DEVICE, IMPELLA (N/A) as a surgical intervention.  The patient's history has been reviewed, patient examined, no change in status, stable for surgery.  I have reviewed the patient's chart and labs.  Questions were answered to the patient's satisfaction.     Ledell Codrington K Quetzali Heinle

## 2024-12-16 NOTE — Progress Notes (Addendum)
 PHARMACY - TOTAL PARENTERAL NUTRITION CONSULT NOTE   Indication: massive bowel resection   Patient Measurements: Height: 5' 10 (177.8 cm) Weight: 79.3 kg (174 lb 13.2 oz) IBW/kg (Calculated) : 73 TPN AdjBW (KG): 73.4 Body mass index is 25.08 kg/m. Usual Weight: weight 79kg on admission, unclear baseline.   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 Patient underwent an aortic and mitral valve replacement on 12/8 with insertion of Impella. Post-op was found to have continuing HgB drop with noted abdominal pain. Was found to have hemorrhage from intercostal, chest tube and embolization was completed. Workup for dropping HgB included CT angio of chest/ab/and pelvis showed pneumatosis and concern for bowel ischemia. Patient went to OR on 12/12 for ex-lap found to have ischemia of distal ileum over 75cm without perforation. Patient was left in discontinuity with plans to return to OR.  Patient with diet throughout admission however noted nausea and abdominal pain during peri-op time period. Pharmacy consulted to manage TPN.   12/21 Did not tolerate tube feeds, vomiting. Held tube feeds.  12/23 trached 12/24 To OR for Impella removal  Glucose / Insulin : no hx DM, A1c 6.1% - CBGs < 180 on SSI (5 units insulin  aspart/24 hrs)  Electrolytes: Na 143, CoCa 10.6, Phos 4.3, K 5.4, Mg 1.9 Renal: SCr 0.59 (BL SCr ~ 0.7), BUN down 24 12/15 Lasix  60 mg IV x 2  12/16-12/19 Lasix  80 mg IV x BID 12/20 Lasix  80 mg IV x1 Hepatic: AST/ALT mildly elevated, tbili down 1.9 (no jaundice), alk phos wnl, TG 238, albumin  2.1 - elevation not due to TPN Intake / Output; MIVF: UOP 2.1 ml/kg/hr, NG 1850 mL, chest tube 230 mL, stool 325 mL, net +2.3 L  GI Imaging: 12/11 CT: pneumatosis of small bowel with gas, concerning for bowel ischemia  GI Surgeries / Procedures:  12/11 ex-lap with distal ileum with  ischemia (75cm), left in discontinuity with plans to return to OR on Sunday to evaluate for further ischemia   12/14 ex lap, placement of wound vac, right colectomy due to necrosis, abdomen left open  12/16 small bowel resection, end ileostomy, closure   Central access: PICC placed 12/04/24 TPN start date: 12/04/24  Nutritional Goals:  Goal concentrated TPN rate with lipids is 72 mL/hr (76g/L AA, 38g/L ILE, and 16.5% CHO will provide 131g AA, 285g CHO, 64g ILE and 2134 kcals per day)  Goal concentrated TPN rate without lipids is 50 mL/hr (provides 120g AA, 240g CHO, and ~1300 kCal per day) > 12/22 PM: propofol  gtt started, current rate @ ~26 mL/hr = ~ 700 kcal/24hr > removing lipids from TPN 12/23 >>  RD Estimated Needs Total Energy Estimated Needs: 2200-2400kcals Total Protein Estimated Needs: 125-150 g Total Fluid Estimated Needs: 1.8 L  Current Nutrition:  TPN   Plan:  Continue concentrated TPN without lipids at goal of 50 ml/hr.  Electrolytes in TPN: Na 0 mEq/L, decrease K 40 mEq/L, Ca 57mEq/L, Mg 20mEq/L, Phos 47mmol/L, max CL (ratio is 1:2.27, required for TPN compatibility) Add standard MVI and trace elements to TPN Continue sensitive SSI Q6H  Give Mag 2 gms IV x 1 today Monitor TPN labs on Mon/Thurs  Plan to start low dose trickle feeds after Impella removal today. GS recommends starting at 10 cc/hr for at least 24 hrs.   Thank you for allowing pharmacy to be a part of this patients care.  Emcor  Lyle, PharmD, [facility] Clinical Pharmacist Please see AMION for all Pharmacists' Contact Phone Numbers 12/16/2024, 7:14 AM

## 2024-12-16 NOTE — Progress Notes (Addendum)
 Patient ID: Alejandro Lopez, male   DOB: 11/02/1981, 43 y.o.   MRN: [id number]     Advanced Heart Failure Rounding Note  Cardiologist: None  Chief Complaint: Valvular Heart Disease & HFrEF Patient Profile  43 y.o. male w/ h/o heavy alcohol use (at least 5 drinks per evening) and h/o asthma admitted w/ acute systolic heart failure. Strong family history of cardiac disease: mother and maternal grandmother had heart failure and father with CAD. Admitted with acute HFrEF/cardiogenic shock. Echo w/ severe AR, MR and LV dysfunction.    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  12/9 VT/VF ? vagal episode. Extubated 12/11: S/p R thoracentesis with resultant hemothorax.  Chest tube placed. S/p R intercostal artery coil (IR). S/p exp lap for ischemic bowel resection, bowel left in discontinuity 12/15: Back to OR for ischemic R colon and mesenteric ischemia. S/p exp/lap, wound vac exchange, R colectomy without anastomosis.  12/16 Back to OR for small bowel resection and ileostomy  12/18 OR for VATS/hemothorax washout, pericardial window was not done.  12/19 extubated 12/21 Reintubated. Ileus 12/23 S/p Trach. Fever post procedure, Tmax 101. Abx switched to cefepime     Subjective:    S/p trach yesterday. Sedated. FiO2 40%.   On DBA 2.5, + Impella 5.5 at P-4. Flow 2.0L. Co-ox 81%, CVP 12. Heading to OR for Impella removal.   Remains on abx + micafungin . Fever curve down-trending.   Good UOP. 3L out yesterday. SCr 0.59.     Objective:   Weight Range: 79.3 kg Body mass index is 25.08 kg/m.   Vital Signs:   Temp:  [99.1 F (37.3 C)-101.1 F (38.4 C)] 100.2 F (37.9 C) (12/24 0600) Pulse Rate:  [77-102] 81 (12/24 0600) Resp:  [0-31] 26 (12/24 0600) BP: (102-127)/(70-84) 119/81 (12/24 0600) SpO2:  [96 %-100 %] 99 % (12/24 0600) Arterial Line BP: (120-149)/(62-78) 139/69 (12/24 0600) FiO2 (%):  [40 %-100 %] 40 % (12/24 0304) Weight:  [79.3 kg] 79.3  kg (12/24 0500) Last BM Date : 12/15/24  Weight change: Filed Weights   12/12/24 0500 12/14/24 0358 12/16/24 0500  Weight: 76.2 kg 73.4 kg 79.3 kg    Intake/Output:   Intake/Output Summary (Last 24 hours) at 12/16/2024 9277 Last data filed at 12/16/2024 0700 Gross per 24 hour  Intake 4005.21 ml  Output 4335 ml  Net -329.79 ml     Physical Exam   CVP 12 General: critically ill, on vent through trach  Cor: RRR. No MRG  Lungs: clear anteriorly  Abdomen: Wound RN replacing VAC foam Extremities: no LEE  Neuro: sedated   GU: + foley    Telemetry   NSR 80s, personally reviewed   Labs    CBC Recent Labs    12/15/24 0430 12/15/24 0437 12/16/24 0517 12/16/24 0529  WBC 14.1*  --  12.0*  --   NEUTROABS 10.9*  --  8.3*  --   HGB 8.6*   < > 8.5* 8.5*  HCT 25.4*   < > 25.6* 25.0*  MCV 90.1  --  90.5  --   PLT 317  --  352  --    < > = values in this interval not displayed.   Basic Metabolic Panel Recent Labs    87/77/74 0501 12/14/24 0518 12/15/24 0430 12/15/24 0437 12/16/24 0517 12/16/24 0529  NA 144   < > 144   < > 143 144  K 4.9   < > 4.4   < >  4.8 5.4*  CL 110  --  113*  --  113*  --   CO2 25  --  23  --  24  --   GLUCOSE 136*  --  113*  --  107*  --   BUN 36*  --  29*  --  24*  --   CREATININE 0.61  --  0.51*  --  0.59*  --   CALCIUM  9.0  --  8.9  --  8.9  --   MG 2.5*  --  2.1  --  1.9  --   PHOS 3.5  --   --   --  4.3  --    < > = values in this interval not displayed.   Liver Function Tests Recent Labs    12/14/24 0501 12/15/24 0430  AST 165* 175*  ALT 170* 208*  ALKPHOS 106 127*  BILITOT 1.9* 1.9*  PROT 6.1* 6.2*  ALBUMIN  2.1* 2.2*   No results for input(s): LIPASE, AMYLASE in the last 72 hours. Cardiac Enzymes No results for input(s): CKTOTAL, CKMB, CKMBINDEX, TROPONINI in the last 72 hours.  BNP: BNP (last 3 results) No results for input(s): BNP in the last 8760 hours.  ProBNP (last 3 results) Recent Labs     11/22/24 1958  PROBNP 3,354.0*     D-Dimer No results for input(s): DDIMER in the last 72 hours.  Hemoglobin A1C No results for input(s): HGBA1C in the last 72 hours. Fasting Lipid Panel Recent Labs    12/16/24 0517  TRIG 238*    Thyroid Function Tests No results for input(s): TSH, T4TOTAL, T3FREE, THYROIDAB in the last 72 hours.  Invalid input(s): FREET3  Other results:   Imaging    DG CHEST PORT 1 VIEW Result Date: 12/04/2024 CLINICAL DATA:  Ventilator dependence. EXAM: PORTABLE CHEST 1 VIEW COMPARISON:  12/03/2024 FINDINGS: Endotracheal tube tip is approximately 3.8 cm above the base of the carina. The NG tube passes into the stomach although the distal tip position is not included on the film. Right-sided Impella device is stable in position. A left IJ pulmonary artery catheter tip overlies expected location of the interlobar pulmonary artery. Right pleural drain again noted with persistent right pleural fluid. Basilar collapse/consolidation noted, IMPRESSION: 1. No substantial interval change. 2. Persistent right pleural effusion with right base collapse/consolidation. 3. Support apparatus as above. Electronically Signed   By: Camellia Candle M.D.   On: 12/04/2024 07:15   DG Abd 1 View Result Date: 12/04/2024 CLINICAL DATA:   Routine adult health maintenance Best images obtainable due to patient's condition EXAM: ABDOMEN - 1 VIEW COMPARISON:  12/04/2024 FINDINGS: NG tube tip is in the distal stomach. Diffuse gaseous distention of colon evident, similar to prior. IMPRESSION: NG tube tip is in the distal stomach. Electronically Signed   By: Camellia Candle M.D.   On: 12/04/2024 07:11   CT Angio Chest/Abd/Pel for Dissection W and/or W/WO Addendum Date: 12/03/2024 ADDENDUM #1 ADDENDUM: ---------------------------------------------------- These results were called to Dr. Kimm Ungaro  at 10:15 pm on 12/03/2024. Electronically signed by: Franky Crease MD 12/03/2024 10:23 PM EST  RP Workstation: [id number]   Result Date: 12/03/2024 ORIGINAL REPORT EXAM: CT CHEST, ABDOMEN AND PELVIS WITH AND WITHOUT CONTRAST 12/03/2024 09:52:07 PM TECHNIQUE: CT of the chest, abdomen and pelvis was performed with and without the administration of intravenous contrast. Multiplanar reformatted images are provided for review. Automated exposure control, iterative reconstruction, and/or weight based adjustment of the mA/kV was utilized to reduce the radiation  dose to as low as reasonably achievable. COMPARISON: None available. CLINICAL HISTORY: Acute aortic syndrome (AAS) suspected; Evaluate for intercostal bleeding or other sources after thoracentesis; also evaluate bowel. FINDINGS: CHEST: MEDIASTINUM AND LYMPH NODES: Mild cardiomegaly. Impella device tip in the left ventricle. Swan-ganz catheter tip in the central right pulmonary artery. No evidence of aortic aneurysm or dissection. The central airways are clear. No mediastinal, hilar or axillary lymphadenopathy. LUNGS AND PLEURA: Right chest tube in place. Moderate to large right pleural effusion with compressive atelectasis in the right middle lobe and right lower lobe. There appears to be active extravasation of contrast posteriorly between the right 9th and 10th ribs and likely into the pleural space, possibly related to prior thoracentesis. The pleural fluid appears complex on the right, likely hemothorax. Small left pleural effusion with compressive atelectasis in the left lower lobe. Tiny right pneumothorax. Biapical subpleural blebs. No evidence of pulmonary embolus. ABDOMEN AND PELVIS: LIVER: Gas is seen branching peripherally in the liver compatible with portal venous gas. GALLBLADDER AND BILE DUCTS: Gallbladder is unremarkable. No biliary ductal dilatation. SPLEEN: No acute abnormality. PANCREAS: No acute abnormality. ADRENAL GLANDS: No acute abnormality. KIDNEYS, URETERS AND BLADDER: No stones in the kidneys or ureters. No hydronephrosis. No  perinephric or periureteral stranding. Urinary bladder is unremarkable. GI AND BOWEL: Stomach and small bowel are decompressed. Diffuse gaseous distention of the colon suggests ileus. Moderate stool burden in the rectosigmoid colon. Pneumatosis noted in small bowel loops in the lower pelvis with air in the mesenteric veins feeding these small bowel loops. Pneumatosis also noted in the right colon wall. REPRODUCTIVE ORGANS: No acute abnormality. PERITONEUM AND RETROPERITONEUM: No ascites. No free air. VASCULATURE: Aorta is normal in caliber. ABDOMINAL AND PELVIS LYMPH NODES: No lymphadenopathy. BONES AND SOFT TISSUES: No acute osseous abnormality. No focal soft tissue abnormality. IMPRESSION: 1. Active contrast extravasation between the right 9th and 10th ribs with likely extension into the pleural space, suspicious for ongoing intercostal bleeding likely related to recent thoracentesis. 2. Moderate to large right pleural effusion compatible with hemothorax with compressive atelectasis in the right middle and lower lobes; right chest tube present; tiny right pneumothorax. 3. No evidence of aortic aneurysm, aortic dissection, or pulmonary embolus. 4. Pneumatosis involving small bowel loops in the lower pelvis and the right colon with mesenteric venous gas and portal venous gas, concerning for bowel ischemia. Recommend surgical consultation. 5. Small left pleural effusion with compressive atelectasis in the left lower lobe. 6. . Attempts are being made to contact the ordering physician with the results. An addendum will be made at that time. Electronically signed by: Franky Crease MD 12/03/2024 10:09 PM EST RP Workstation: [id number]   DG Chest Port 1 View Result Date: 12/03/2024 EXAM: 1 VIEW(S) XRAY OF THE CHEST 12/03/2024 07:57:00 PM COMPARISON: Portable chest today at 7:02 pm. CLINICAL HISTORY: Encounter for chest tube placement. FINDINGS: LINES, TUBES AND DEVICES: A pigtail chest tube has been inserted on the  right through the 6th intercostal space, with the pigtail in the lateral upper right thorax. Left IJ Swan-Ganz line again terminates in the distal right pulmonary artery. Stable positioning of right subclavian Impella device. LUNGS AND PLEURA: No pneumothorax. Moderate to large right pleural effusion is still present but decreased in the interval. Small left pleural effusion unchanged. The right mid to lower lung is obscured by pleural fluid. There is patchy consolidation in the left lower lung field. Left mid and both apical lungs are clear. HEART AND MEDIASTINUM: Sternotomy sutures and two  cardiac valve replacements are again shown. There is mild cardiomegaly and mild central vascular prominence as before. BONES AND SOFT TISSUES: No acute osseous abnormality. IMPRESSION: 1. Right pigtail chest tube in position with no measurable pneumothorax. 2. Moderate to large right pleural effusion, decreased compared to earlier today. 3. Patchy consolidation in the left lower lung field. 4. Small left pleural effusion, unchanged. 5. Cardiomegaly . Electronically signed by: Francis Quam MD 12/03/2024 08:10 PM EST RP Workstation: [id number]   DG CHEST PORT 1 VIEW Result Date: 12/03/2024 EXAM: 1 VIEW(S) XRAY OF THE CHEST 12/03/2024 07:05:00 PM COMPARISON: None available. CLINICAL HISTORY: ABLA (acute blood loss anemia). FINDINGS: LINES, TUBES AND DEVICES: Impella device and Swan-Ganz catheter remain in place, unchanged. LUNGS AND PLEURA: Enlarging right pleural effusion, now large with atelectasis throughout the right lung. Vascular congestion and left basilar atelectasis. Low lung volumes. No pneumothorax. HEART AND MEDIASTINUM: No acute abnormality of the cardiac and mediastinal silhouettes. BONES AND SOFT TISSUES: No acute osseous abnormality. IMPRESSION: 1. Enlarging right pleural effusion, now large, with associated right lung atelectasis. 2. Vascular congestion with left basilar atelectasis. 3. Low lung volumes.  Electronically signed by: Franky Crease MD 12/03/2024 07:10 PM EST RP Workstation: [id number]   DG Chest Port 1 View Result Date: 12/03/2024 CLINICAL DATA:  Pleural effusion, status post thoracentesis EXAM: PORTABLE CHEST 1 VIEW COMPARISON:  12/03/2024 FINDINGS: Single frontal view of the chest demonstrates stable left internal jugular flow directed central venous catheter, tip overlying the right infrahilar region. Stable mitral and aortic valve prostheses. Cardiac silhouette remains enlarged. Lung volumes are diminished, with bibasilar veiling opacities, right greater than left, consistent with consolidation and effusions. No appreciable change since prior study. No evidence of pneumothorax. IMPRESSION: 1. Persistent bibasilar consolidation and effusions, right greater than left. 2. No evidence of pneumothorax. 3. Stable enlarged cardiac silhouette. Electronically Signed   By: Ozell Daring M.D.   On: 12/03/2024 16:10    Medications:   Scheduled Medications:  arformoterol   15 mcg Nebulization BID   Chlorhexidine  Gluconate Cloth  6 each Topical Daily   colchicine   0.6 mg Per Tube Daily   docusate  100 mg Per Tube BID   famotidine   20 mg Per Tube BID   feeding supplement (PROSource TF20)  60 mL Per Tube Daily   folic acid   1 mg Per Tube Daily   Gerhardt's butt cream   Topical TID   insulin  aspart  0-9 Units Subcutaneous Q6H   lidocaine   1 patch Transdermal Q24H   mouth rinse  15 mL Mouth Rinse Q2H   pantoprazole  (PROTONIX ) IV  40 mg Intravenous Daily   PHENObarbital   65 mg Intravenous Q8H   Followed by   PHENObarbital   32.5 mg Intravenous Q8H   polyethylene glycol  17 g Per Tube Daily   QUEtiapine   25 mg Per Tube QHS   revefenacin   175 mcg Nebulization Daily   sodium chloride  flush  3 mL Intravenous Q12H   thiamine   100 mg Per Tube Daily    Infusions:  sodium chloride  20 mL/hr at 12/11/24 0312   amiodarone  30 mg/hr (12/16/24 0700)   ceFEPime  (MAXIPIME ) IV Stopped (12/16/24 0604)    dexmedetomidine  (PRECEDEX ) IV infusion 0.9 mcg/kg/hr (12/16/24 0700)   DOBUTamine  2.5 mcg/kg/min (12/16/24 0700)   heparin  Stopped (12/16/24 0537)   magnesium  sulfate bolus IVPB     micafungin  (MYCAMINE ) 100 mg in sodium chloride  0.9 % 100 mL IVPB Stopped (12/15/24 1850)   norepinephrine  (LEVOPHED ) Adult infusion Stopped (12/13/24 0700)  propofol  (DIPRIVAN ) infusion 60 mcg/kg/min (12/16/24 0700)   sodium bicarbonate  25 mEq (Impella PURGE) in dextrose  5 % 1000 mL bag     TPN ADULT (ION) 50 mL/hr at 12/16/24 0700   TPN ADULT (ION)      PRN Medications: sodium chloride , sodium chloride , acetaminophen  (TYLENOL ) oral liquid 160 mg/5 mL, albuterol , fentaNYL  (SUBLIMAZE ) injection, fentaNYL  (SUBLIMAZE ) injection, hydrALAZINE , LORazepam , midazolam  PF, morphine  injection, ondansetron  (ZOFRAN ) IV, mouth rinse, oxyCODONE , sodium chloride , sodium chloride  flush  Assessment/Plan  1.  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  Valvular Disease  - severe MR posteriorly directed, mod TR. Likely functional.  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 Acute hypoxic/hypercarbic respiratory failure - reintubated 12/21 due to hypercarbia  - suspect mostly muscle weakness and ab distension - s/p Trach 12/23 - doubt volume overload is major issue now as he is well decongested with Impella   3. Acute Systolic Heart Failure w/ Biventricular Dysfunction >> caridogenic shock - HS trop not c/w ACS but EKG w/ anteroseptal Qs  - CMRI Severe AR/MR. LVEF 35%  - Cath- normal cors, RA5, PA 32/17 (24), PVR 2.57 Papi 3. CO 4.9 CI 3.8 - Post-op Echo 12/02/24: EF < 20% with severe LVH (new, ?inflammatory), moderately decreased RV function, stable bioprosthetic MV and AoV.  - Co-ox 81%, Plan Impella removal today  - Continue DBA 2.5 mcg/kg/min - Will add GDMT as BP permits  - No lasix  today. He is autodiuresing.   4. R  hemothorax - Hemothorax s/p right thoracentesis with intercostal artery injury. Massive bleeding requiring multiple products and development of hemorrhagic shock, now improved. S/p IR embolization.  - S/p VATS 12/18 with right chest washout.  - H/H stable  - Chest tube in place  5.  ETOH Abuse - heavy drinker, at least 5 drinks per evening - may be contributing to CM, reduction of intake imperative  - No evidence of withdrawal   6  Hypomagnesemia - follow; goal >2    7 . DMII  - Hgb A1C 6.1 - On SSI.  8. Ischemic bowel Mesenteric ischemia - CXR with dilated bowels.  - CT scan demonstrated active extravasation from a posterior intercostal artery. Also demonstrated portal venous gas and pneumatosis of the small and large bowel.  - S/p exp/lap 12/11. Patchy ischemia of the distal ileum extending over about 75 cm without perforation. Ischemic segment resected.  Colon was distended with gas but viable. Small bowel left in discontinuity.  - Back to OR 12/15 for ischemic R colon and mesenteric ischemia. S/p exp/lap, wound vac exchange, R colectomy without anastomosis.  - Returned to OR 12/16 for ileostomy creation.  - GSU following. Ileostomy working well - Appears to have ileus. TPN/TF per GS   9. ID - T max 101 F overnight - BAL w/ enterobacter cloacae and candida albicans. Zosyn  >> meropenem  12/21, vanc off and micafungin  started 12/22 - Cefepime  added 12/23. Now off Zosyn    10. AKI - AKI with shock.  - Resolved  - UOP good   11. FEN - TPN.   12. VT - Quiescent - On amiodarone  gtt.   13. Pericardial effusion - There is a moderate effusion without tamponade, noted again by echo 12/16.  Will need to follow over time.  - Limited echo 12/17 with moderate effusion.  - This was not drained at time of VATS/right hemothorax washout on 12/18. Appeared  fibrinous.  14 Atrial flutter - In and out of AFL with controlled rate. Currently NSR - Continue amiodarone  gtt.  - Heparin  off  for Impella removal. Restart once ok w/ CT surgery     CRITICAL CARE Performed by: Caffie Shed   Total critical care time: 20 minutes  Critical care time was exclusive of separately billable procedures and treating other patients.  Critical care was necessary to treat or prevent imminent or life-threatening deterioration.  Critical care was time spent personally by me on the following activities: development of treatment plan with patient and/or surrogate as well as nursing, discussions with consultants, evaluation of patient's response to treatment, examination of patient, obtaining history from patient or surrogate, ordering and performing treatments and interventions, ordering and review of laboratory studies, ordering and review of radiographic studies, pulse oximetry and re-evaluation of patient's condition.  Caffie Shed, PA-C 12/16/2024   Agree with above.   Now s/p trach. Going for Impella extraction today.   Currently on DBA 3 and Impella at P-4 with 2L flow (waveforms ok) CVP loe. Co-ox 80%  Feels weak.   General:  Sitting up in bed. On vent via trach HEENT: normal Neck: supple. no JVD.  + trach Cor: Regular rate & rhythm. No rubs, gallops or murmurs. Impella site ok  Lungs:decreased  Abdomen: soft, nontender, nondistended.ostomy ok Extremities: no cyanosis, clubbing, rash, edema Neuro: alert & orientedx3, cranial nerves grossly intact. moves all 4 extremities w/o difficulty. Affect pleasant  Stable on Impella support and low-dose DBA. Agree with plan to pull Impella. Once Impella out can wean DBA slowly. No diuretics today.   D/w CCM and TCTS.   CRITICAL CARE Performed by: Cherrie Sieving  Total critical care time: 35 minutes  Critical care time was exclusive of separately billable procedures and treating other patients.  Critical care was necessary to treat or prevent imminent or life-threatening deterioration.  Critical care was time spent  personally by me (independent of midlevel providers or residents) on the following activities: development of treatment plan with patient and/or surrogate as well as nursing, discussions with consultants, evaluation of patient's response to treatment, examination of patient, obtaining history from patient or surrogate, ordering and performing treatments and interventions, ordering and review of laboratory studies, ordering and review of radiographic studies, pulse oximetry and re-evaluation of patient's condition.   Sieving Cherrie, MD  5:32 PM

## 2024-12-16 NOTE — Anesthesia Postprocedure Evaluation (Signed)
"   Anesthesia Post Note  Patient: Alejandro Lopez  Procedure(s) Performed: REMOVAL, CARDIAC ASSIST DEVICE, IMPELLA (Right: Chest)     Patient location during evaluation: SICU Anesthesia Type: General Level of consciousness: sedated Pain management: pain level controlled Vital Signs Assessment: post-procedure vital signs reviewed and stable Respiratory status: patient remains intubated per anesthesia plan and patient on ventilator - see flowsheet for VS Cardiovascular status: stable Postop Assessment: no apparent nausea or vomiting Anesthetic complications: no   No notable events documented.  Last Vitals:  Vitals:   12/16/24 0730 12/16/24 0921  BP:    Pulse: 80 75  Resp: 19 20  Temp:    SpO2: 100% 100%    Last Pain:  Vitals:   12/16/24 0047  TempSrc:   PainSc: Asleep                 Cristo Ausburn,W. EDMOND      "

## 2024-12-16 NOTE — Progress Notes (Signed)
 Nutrition Follow-up  DOCUMENTATION CODES:   Not applicable  INTERVENTION:   TPN to meet nutritional needs; recommend continuing TPN at goal rate until pt demonstrating tolerance of trickle TF with ability to advance to goal.   MD approves re-initiating trickle tube feeds via NG: reassess GI tolerance with close monitoring Vital 1.5 at 20 ml/hr (no advanacement)  Ultimate EN Goal: Vital 1.5 at 60 ml/hr with Pro-Source TF20 60 mL BID  TF at goal provides 2320 kcals, 137 g of protein and 1094 mL of free water   Cortrak Friday if pt tolerating trickle feeds  NUTRITION DIAGNOSIS:   Inadequate oral intake related to acute illness, altered GI function as evidenced by NPO status.  Being addressed via nutrition support  GOAL:   Patient will meet greater than or equal to 90% of their needs  Progressing  MONITOR:   Vent status, TF tolerance, Labs, Skin, I & O's, Weight trends (TPN)  REASON FOR ASSESSMENT:   Consult, Ventilator New TPN/TNA, Assessment of nutrition requirement/status  ASSESSMENT:   43 yo male admitted with acute systolic heart failure with severe MR and AI. Pt to OR on 12/08 for AVR, MVR and Impella 5.5 placement and extubated 12/09. Pt with thoracentesis on 12/11 followed by IR coil embolization for bleeding intercostal artery with R hemothorax, pt also developed ischemic bowel requiring bowel resection. PMH includes HTN, tobacco and Etoh abuse (reports drinking at least 5 drinks per night)  11/30 Admitted, Echo EF 35-40% (grade 3 diastolic dysfunction), severe MR, aortic insufficiency 12/08 OR: AVR, MVR, Impella 5.5 with Dr. Lucas 12/09 Extubated 12/11 Thoracentesis with 1L bloody output, post procedure anemia, +R Hemithorax, IR for coil embolization of intercostal artery secondary to active bleeding, OR for ischemic bowel (patchy ischemia distal ileum ~75 cm) without perforation requiring resection, bowel left in discontinuity, abdomen open with ABThera in  place 12/12 TPN initiated 12/15 OR: ischemic R colon requiring R colectomy without anastomosis 12/16 OR: SB resection, abd closed, end ileostomy creation 12/18 OR for VATS/hemothorax washout, pericardial window not performed 12/19 Cortrak placed, extubated 12/21 re-intubated, Cortrak removed, NG placed for decompression 12/23 s/p trach 12/24 trickle feeds re-initiated; Impella removed  Pt in OR for Impella removal. MD wants to reinitiate trickle tube feeds to assess tolerance. Using NG to feed but may replace with Cortrak Friday if pt tolerates feeds. No advancement orders but left recommendations. TPN remains at full rate to meet nutrition needs.   Will monitor tolerance of feeds and ability for advancement.   TPN at at 72 ml/hr  UOP 3055 mL x 24 h NG output: 700 x 24 h Ileostomy: 450 ml x 24 h Chest tube: 130 ml x 24 h  Labs: Sodium 144 BUN 24 Creatinine 0.59 Potassium 5.4<--4.8 Phosphorus 4.3 Magnesium  1.9 AST 175/ALT 208 (high) CBGs 101-110  Meds: Colace BID Pepcid  Prosource TF 20  Folic acid  SSI 0-9 units q4h Protonix  Phenobarbital  taper Miralax  Thiamine  100 mg daily Amio 30 mg/h Dex 0.9 mcg/kg/hr Magnesium  sulfate IVPB Propofol  (28.6 ml/h provides additional 755 lipid calories daily at current rate)   Diet Order:   Diet Order             Diet NPO time specified  Diet effective now                   EDUCATION NEEDS:   Not appropriate for education at this time  Skin:  Skin Assessment: Skin Integrity Issues: Skin Integrity Issues:: Other (Comment), DTI DTI: buttocks Wound Vac:  open abdomen-ABThera Incisions: chest x 2 Other: new ileostomy  Last BM:  450 ml ileostomy output x 24 h  Height:   Ht Readings from Last 1 Encounters:  12/14/24 5' 10 (1.778 m)    Weight:   Wt Readings from Last 1 Encounters:  12/16/24 79.3 kg    BMI:  Body mass index is 25.08 kg/m.  Estimated Nutritional Needs:   Kcal:  2200-2400kcals  Protein:   125-150 g  Fluid:  1.8 L     Josette Glance, MS, RDN, LDN Clinical Dietitian I Please reach out via secure chat

## 2024-12-16 NOTE — Op Note (Signed)
"                                                                                            °  CARDIOVASCULAR SURGERY OPERATIVE NOTE  12/16/2024  Surgeon:  Dorise LOIS Fellers, MD  First Assistant: RNFA   Preoperative Diagnosis:  Bi-ventricular heart failure s/p MVR, AVR, insertion of right axillary Impella 5.5 on 11/30/2024.   Postoperative Diagnosis:  Same   Procedure:  Removal of Impella 5.5.  Anesthesia:  General Endotracheal   Clinical History/Surgical Indication:  The patient is a 43 year old gentleman has severe aortic insufficiency and severe mitral regurgitation with an ejection fraction of around 35% presenting with acute systolic congestive heart failure. He has normal coronary anatomy. He underwent AVR and MVR with pericardial valves with planned insertion of a right axillary artery Impella 5.5 for postop support. He did well initially and then his postop course became complicated after a right thoracentesis with intercostal artery laceration and massive right hemothorax, hemodynamic instability and development of an acute abdomen with air within the wall of the distal small bowel and cecum. He required resection of 75 cm of distal ileum and then required right hemicolectomy and ileostomy. He has continued to improve and underwent tracheostomy at the bedside yesterday to enable vent weaning. He has remained hemodynamically stable and Impella has been weaned to P4 with minimal inotropic support. It is felt that it is time for removal. I discussed the procedure with his wife including benefits and risks and she understands and signed the consent for the procedure.  Preparation:  The patient was seen in the ICU and the correct patient, correct operation were confirmed. The consent was signed by me. Preoperative antibiotics were given.  The patient was taken back to the operating room and positioned supine on the operating room table. After being placed under general endotracheal  anesthesia by the anesthesia team the neck and chest were prepped with betadine soap and solution and draped in the usual sterile manner. A surgical time-out was taken and the correct patient and operative procedure were confirmed with the nursing and anesthesia staff.  Removal of Impella 5.5:  The right axillary incision was opened and carried down to the axillary artery graft. The Impella 5.5 was turned down to P1 and withdrawn back across the aortic valve. It was then stopped and removed. The graft was clamped and ligated with a #1 silk tie about 1 cm distal to the anastomosis. The graft was divided and the stump ligated with a 3-0 Prolene pledgetted horizontal mattress suture. Hemostasis was complete. The distal section of graft was removed through the skin exit site. The wound was irrigated with saline. The pectoralis muscle was approximated with 2-0 Vicryl suture. The subcutaneous tissue was approximated with 3-0 Vicryl suture. Skin was closed with subcuticular 3-0 Vicryl suture. The skin exit site was packed with Xeroform gauze. The sponge, needle, and instrument counts were correct according to the nurses. A dry, sterile dressing was applied.   The patient was then transported to the surgical intensive care unit in stable condition.    "

## 2024-12-16 NOTE — Transfer of Care (Signed)
 Immediate Anesthesia Transfer of Care Note  Patient: Alejandro Lopez  Procedure(s) Performed: REMOVAL, CARDIAC ASSIST DEVICE, IMPELLA (Right: Chest)  Patient Location: ICU  Anesthesia Type:General  Level of Consciousness: sedated and Patient remains intubated per anesthesia plan  Airway & Oxygen  Therapy: Patient remains intubated per anesthesia plan and Patient placed on Ventilator (see vital sign flow sheet for setting)  Post-op Assessment: Report given to RN and Post -op Vital signs reviewed and stable  Post vital signs: Reviewed and stable  Last Vitals:  See ICU flowsheet, VSS before leaving patient   Last Pain:  Vitals:   12/16/24 0047  TempSrc:   PainSc: Asleep      Patients Stated Pain Goal: 0 (12/03/24 0400)  Complications: No notable events documented.  Patient transported to ICU with standard monitors (HR, BP, SPO2, RR) and emergency drugs/equipment. Controlled ventilation maintained via ambu bag. Report given to bedside RN and respiratory therapist. Pt connected to ICU monitor and ventilator. All questions answered and vital signs stable before leaving

## 2024-12-16 NOTE — Progress Notes (Signed)
 "  Trauma/Critical Care Follow Up Note  Subjective:    Overnight Issues:   Objective:  Vital signs for last 24 hours: Temp:  [99.1 F (37.3 C)-101.1 F (38.4 C)] 100.4 F (38 C) (12/24 0715) Pulse Rate:  [75-88] 75 (12/24 0921) Resp:  [0-31] 20 (12/24 0921) BP: (106-127)/(72-84) 125/79 (12/24 0700) SpO2:  [96 %-100 %] 100 % (12/24 0921) Arterial Line BP: (120-155)/(62-78) 155/78 (12/24 0730) FiO2 (%):  [40 %-100 %] 60 % (12/24 0921) Weight:  [79.3 kg] 79.3 kg (12/24 0500)  Intake/Output from previous day: 12/23 0701 - 12/24 0700 In: 4005.2 [I.V.:3224.2; IV Piggyback:505] Out: 4335 [Urine:3055; Emesis/NG output:700; Stool:450; Chest Tube:130]  Intake/Output this shift: Total I/O In: -  Out: 20 [Blood:20]  Vent settings for last 24 hours: Vent Mode: PRVC FiO2 (%):  [40 %-100 %] 60 % Set Rate:  [20 bmp] 20 bmp Vt Set:  [580 mL] 580 mL PEEP:  [5 cmH20] 5 cmH20 Plateau Pressure:  [22 cmH20-24 cmH20] 22 cmH20  Physical Exam:  Gen: comfortable, no distress Neuro: sedated on exam HEENT: PERRL Neck: supple CV: RRR Pulm: unlabored breathing on mechanical ventilation-full support Abd: soft, NT, midline vac in place , ostomy productive GU: urine clear and yellow, +Foley Extr: wwp, no edema  Results for orders placed or performed during the hospital encounter of 11/22/24 (from the past 24 hours)  Triglycerides     Status: Abnormal   Collection Time: 12/15/24 10:32 AM  Result Value Ref Range   Triglycerides 356 (H) <150 mg/dL  Cooxemetry Panel (carboxy, met, total hgb, O2 sat)     Status: Abnormal   Collection Time: 12/15/24 10:56 AM  Result Value Ref Range   Total hemoglobin 7.7 (L) 12.0 - 16.0 g/dL   O2 Saturation 11.8 %   Carboxyhemoglobin 0.8 0.5 - 1.5 %   Methemoglobin <0.7 0.0 - 1.5 %  Glucose, capillary     Status: Abnormal   Collection Time: 12/15/24 10:57 AM  Result Value Ref Range   Glucose-Capillary 109 (H) 70 - 99 mg/dL  Culture, BAL-quantitative w Gram  Stain     Status: None (Preliminary result)   Collection Time: 12/15/24 11:48 AM   Specimen: Bronchoalveolar Lavage; Respiratory  Result Value Ref Range   Specimen Description BRONCHIAL ALVEOLAR LAVAGE    Special Requests NONE    Gram Stain      NO WBC SEEN NO ORGANISMS SEEN Performed at [facility], [address], [place], [place] [address]    Culture PENDING    Report Status PENDING   Glucose, capillary     Status: Abnormal   Collection Time: 12/15/24  3:14 PM  Result Value Ref Range   Glucose-Capillary 104 (H) 70 - 99 mg/dL  Glucose, capillary     Status: Abnormal   Collection Time: 12/15/24  6:32 PM  Result Value Ref Range   Glucose-Capillary 101 (H) 70 - 99 mg/dL  Glucose, capillary     Status: Abnormal   Collection Time: 12/15/24 11:42 PM  Result Value Ref Range   Glucose-Capillary 110 (H) 70 - 99 mg/dL  Lactate dehydrogenase (LDH)     Status: Abnormal   Collection Time: 12/16/24  5:17 AM  Result Value Ref Range   LDH 766 (H) 105 - 235 U/L  Magnesium      Status: None   Collection Time: 12/16/24  5:17 AM  Result Value Ref Range   Magnesium  1.9 1.7 - 2.4 mg/dL  Triglycerides     Status: Abnormal  Collection Time: 12/16/24  5:17 AM  Result Value Ref Range   Triglycerides 238 (H) <150 mg/dL  Basic metabolic panel with GFR     Status: Abnormal   Collection Time: 12/16/24  5:17 AM  Result Value Ref Range   Sodium 143 135 - 145 mmol/L   Potassium 4.8 3.5 - 5.1 mmol/L   Chloride 113 (H) 98 - 111 mmol/L   CO2 24 22 - 32 mmol/L   Glucose, Bld 107 (H) 70 - 99 mg/dL   BUN 24 (H) 6 - 20 mg/dL   Creatinine, Ser 9.40 (L) 0.61 - 1.24 mg/dL   Calcium  8.9 8.9 - 10.3 mg/dL   GFR, Estimated >39 >39 mL/min   Anion gap 6 5 - 15  Phosphorus     Status: None   Collection Time: 12/16/24  5:17 AM  Result Value Ref Range   Phosphorus 4.3 2.5 - 4.6 mg/dL  CBC with Differential/Platelet     Status: Abnormal   Collection Time: 12/16/24  5:17 AM  Result Value Ref Range   WBC  12.0 (H) 4.0 - 10.5 K/uL   RBC 2.83 (L) 4.22 - 5.81 MIL/uL   Hemoglobin 8.5 (L) 13.0 - 17.0 g/dL   HCT 74.3 (L) 60.9 - 47.9 %   MCV 90.5 80.0 - 100.0 fL   MCH 30.0 26.0 - 34.0 pg   MCHC 33.2 30.0 - 36.0 g/dL   RDW 81.1 (H) 88.4 - 84.4 %   Platelets 352 150 - 400 K/uL   nRBC 0.5 (H) 0.0 - 0.2 %   Neutrophils Relative % 69 %   Neutro Abs 8.3 (H) 1.7 - 7.7 K/uL   Lymphocytes Relative 17 %   Lymphs Abs 2.1 0.7 - 4.0 K/uL   Monocytes Relative 7 %   Monocytes Absolute 0.9 0.1 - 1.0 K/uL   Eosinophils Relative 2 %   Eosinophils Absolute 0.2 0.0 - 0.5 K/uL   Basophils Relative 1 %   Basophils Absolute 0.1 0.0 - 0.1 K/uL   WBC Morphology MORPHOLOGY UNREMARKABLE    RBC Morphology MORPHOLOGY UNREMARKABLE    Smear Review Normal platelet morphology    Immature Granulocytes 4 %   Abs Immature Granulocytes 0.48 (H) 0.00 - 0.07 K/uL  Cooxemetry Panel (carboxy, met, total hgb, O2 sat)     Status: Abnormal   Collection Time: 12/16/24  5:18 AM  Result Value Ref Range   Total hemoglobin 9.5 (L) 12.0 - 16.0 g/dL   O2 Saturation 19.4 %   Carboxyhemoglobin 1.5 0.5 - 1.5 %   Methemoglobin <0.7 0.0 - 1.5 %  Glucose, capillary     Status: Abnormal   Collection Time: 12/16/24  5:28 AM  Result Value Ref Range   Glucose-Capillary 107 (H) 70 - 99 mg/dL  I-STAT 7, (LYTES, BLD GAS, ICA, H+H)     Status: Abnormal   Collection Time: 12/16/24  5:29 AM  Result Value Ref Range   pH, Arterial 7.419 7.35 - 7.45   pCO2 arterial 40.0 32 - 48 mmHg   pO2, Arterial 152 (H) 83 - 108 mmHg   Bicarbonate 25.6 20.0 - 28.0 mmol/L   TCO2 27 22 - 32 mmol/L   O2 Saturation 99 %   Acid-Base Excess 1.0 0.0 - 2.0 mmol/L   Sodium 144 135 - 145 mmol/L   Potassium 5.4 (H) 3.5 - 5.1 mmol/L   Calcium , Ion 1.26 1.15 - 1.40 mmol/L   HCT 25.0 (L) 39.0 - 52.0 %   Hemoglobin 8.5 (L) 13.0 -  17.0 g/dL   Patient temperature 62.0 C    Sample type ARTERIAL     Assessment & Plan:  LOS: 23 days   Additional comments:I reviewed the  patient's new clinical lab test results.   and I reviewed the patients new imaging test results.    43 y/o M S/P AVR, MCR, Impella placement by Dr. Lucas 12/8   S/P right thoracentesis 12/11 with hemorrhage from intercostal -chest tube placed by TCTS.  S/p emergent IR embolizaton  R intercostal artery by Dr. Philip.    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  - okay to resume trickle TF in the setting of dropping NG o/p and + bowel function. DO not titrate - Impella removal 12/24   FEN: NPO, NG to LIWS, IVF per primary ID: Zosyn , Micafungin , Vanc; resp cx 12/18 NGTD, BCx 12/17 no growth VTE: heparin  gtt Foley: in place Dispo: ICU   Dreama GEANNIE Hanger, MD Trauma & General Surgery Please use [email or web address] to contact on call provider  12/16/2024  *Care during the described time interval was provided by me. I have reviewed this patient's available data, including medical history, events of note, physical examination and test results as part of my evaluation.    "

## 2024-12-17 DIAGNOSIS — I5021 Acute systolic (congestive) heart failure: Secondary | ICD-10-CM | POA: Diagnosis not present

## 2024-12-17 LAB — PHOSPHORUS: Phosphorus: 3.1 mg/dL (ref 2.5–4.6)

## 2024-12-17 LAB — CBC
HCT: 24.8 % — ABNORMAL LOW (ref 39.0–52.0)
Hemoglobin: 8.1 g/dL — ABNORMAL LOW (ref 13.0–17.0)
MCH: 29.7 pg (ref 26.0–34.0)
MCHC: 32.7 g/dL (ref 30.0–36.0)
MCV: 90.8 fL (ref 80.0–100.0)
Platelets: 375 K/uL (ref 150–400)
RBC: 2.73 MIL/uL — ABNORMAL LOW (ref 4.22–5.81)
RDW: 18.3 % — ABNORMAL HIGH (ref 11.5–15.5)
WBC: 12 K/uL — ABNORMAL HIGH (ref 4.0–10.5)
nRBC: 0.8 % — ABNORMAL HIGH (ref 0.0–0.2)

## 2024-12-17 LAB — COMPREHENSIVE METABOLIC PANEL WITH GFR
ALT: 248 U/L — ABNORMAL HIGH (ref 0–44)
AST: 173 U/L — ABNORMAL HIGH (ref 15–41)
Albumin: 2.2 g/dL — ABNORMAL LOW (ref 3.5–5.0)
Alkaline Phosphatase: 166 U/L — ABNORMAL HIGH (ref 38–126)
Anion gap: 8 (ref 5–15)
BUN: 21 mg/dL — ABNORMAL HIGH (ref 6–20)
CO2: 22 mmol/L (ref 22–32)
Calcium: 9 mg/dL (ref 8.9–10.3)
Chloride: 113 mmol/L — ABNORMAL HIGH (ref 98–111)
Creatinine, Ser: 0.47 mg/dL — ABNORMAL LOW (ref 0.61–1.24)
GFR, Estimated: >60 mL/min
Glucose, Bld: 114 mg/dL — ABNORMAL HIGH (ref 70–99)
Potassium: 4.2 mmol/L (ref 3.5–5.1)
Sodium: 142 mmol/L (ref 135–145)
Total Bilirubin: 1.6 mg/dL — ABNORMAL HIGH (ref 0.0–1.2)
Total Protein: 6.2 g/dL — ABNORMAL LOW (ref 6.5–8.1)

## 2024-12-17 LAB — GLUCOSE, CAPILLARY
Glucose-Capillary: 112 mg/dL — ABNORMAL HIGH (ref 70–99)
Glucose-Capillary: 113 mg/dL — ABNORMAL HIGH (ref 70–99)
Glucose-Capillary: 114 mg/dL — ABNORMAL HIGH (ref 70–99)
Glucose-Capillary: 116 mg/dL — ABNORMAL HIGH (ref 70–99)
Glucose-Capillary: 119 mg/dL — ABNORMAL HIGH (ref 70–99)

## 2024-12-17 LAB — COOXEMETRY PANEL
Carboxyhemoglobin: 2.4 % — ABNORMAL HIGH (ref 0.5–1.5)
Methemoglobin: <0.7 % (ref 0.0–1.5)
O2 Saturation: 88.2 %
Total hemoglobin: 8 g/dL — ABNORMAL LOW (ref 12.0–16.0)

## 2024-12-17 LAB — BLOOD GAS, ARTERIAL
Acid-base deficit: 1.9 mmol/L (ref 0.0–2.0)
Bicarbonate: 22.2 mmol/L (ref 20.0–28.0)
O2 Saturation: 99.1 %
Patient temperature: 37.3
pCO2 arterial: 35 mmHg (ref 32–48)
pH, Arterial: 7.41 (ref 7.35–7.45)
pO2, Arterial: 159 mmHg — ABNORMAL HIGH (ref 83–108)

## 2024-12-17 LAB — TRIGLYCERIDES: Triglycerides: 232 mg/dL — ABNORMAL HIGH

## 2024-12-17 LAB — LACTATE DEHYDROGENASE: LDH: 720 U/L — ABNORMAL HIGH (ref 105–235)

## 2024-12-17 LAB — MAGNESIUM: Magnesium: 2 mg/dL (ref 1.7–2.4)

## 2024-12-17 MED ORDER — TRACE MINERALS CU-MN-SE-ZN 300-55-60-3000 MCG/ML IV SOLN
INTRAVENOUS | Status: AC
  Filled 2024-12-17: qty 888

## 2024-12-17 MED ORDER — VITAL 1.5 CAL PO LIQD
1000.0000 mL | ORAL | Status: DC
  Administered 2024-12-17 – 2024-12-18 (×2): 1000 mL

## 2024-12-17 MED ORDER — POLYETHYLENE GLYCOL 3350 17 G PO PACK: 17.0000 g | PACK | Freq: Every day | ORAL | Status: DC | PRN

## 2024-12-17 NOTE — Progress Notes (Signed)
 "  NAME:  Alejandro Lopez, MRN:  [id number], DOB:  Jul 06, 1981, LOS: 24 ADMISSION DATE:  11/22/2024, CONSULTATION DATE:  12/8 REFERRING MD:  Lucas, CHIEF COMPLAINT:  post cardiac surgery critical care services    History of Present Illness:  43 year old male patient with history of hypertension, alcohol and tobacco abuse, presented to the emergency room on 11/30 with approximately 1 month history of progressive dyspnea accompanied by lower extremity swelling extending up to the level of his scrotum and abdomen. Diagnostic evaluation by echocardiogram showed left ventricular ejection fraction 35 to 40% with grade 3 diastolic dysfunction this was further complicated by severe mitral valve regurgitation and aortic insufficiency because of this he was transferred to [facility] for further evaluation. He underwent cardiac catheterization on 12/4: Right heart hemodynamic parameters showed baseline right atrial pressure 5 mmHg, PA pressure 32/17, pulmonary capillary wedge pressure at 14 estimated Fick 4.9 L/min with cardiac index Fick calculated at 2.59 His PAPi was 3 Left heart cath was negative for coronary artery disease Went to OR 12/8 for MVR and AVR w/ impella insertion. PCCM asked to assist w/ post op care   OR course EBL: 1735 Received  Products: cryo 92ml, FFP 401 Also received DDAVP , 2200 crystalloid, 250ml albumin   Cell saver 1125 Pump time 4hrs Clamp time 2hrs 50 min  Events: multiple defibrillations intra-op  Intra-op ECHO EF estimated 40%  Pertinent  Medical History  Tobacco abuse, alcohol abuse, hypertension  Significant Hospital Events: Including procedures, antibiotic start and stop dates in addition to other pertinent events   11/30 admitted/. ECHO 35 to 40% with grade 3 diastolic dysfunction this was further complicated by severe mitral valve regurgitation and aortic insufficiency 12/4 left and right heart cath 12/8 AVR and MVR w/ bioprosthetic valves. Arrived on icu  w/ Impella 5.5 MCS at flow 4 lpm and P 7. Received 2 more PLTs, 2 FFP and 1 cryo in first 6 hrs post op for cont blood loss/oozing. Required NE, epi and milrinone . Good flow on IMPELLA but minimal pulsatility  at P7. Placed on Amio gtt for VT 12/9 received 1 unit PRBC over night for hgb down to 7.5, hgb 8 getting second unit of blood. Still on P7 3.5 lPM. Extubated. 12/11 1L thora, later hemorrhagic shock and hemothorax> IR embolization. Also found to have ischemic bowel and underwent ex-lap with partial resection of ileum. 12/15 plan for ostomy tomorrow, transition epi to levophed   12/16 plan back to OR for ostomy placement  12/17 hemodynamically stable off pressors on Milrinone  0.25 , unable to tolerate SBT, plan for CT chest and heparin  gtt, high fevers>micafungin  12/19 extubated 12/20-12/21 abx to meropenem , aspiration/ agitation/ weakness leading to CO2 retention, RV dysfunction and reintubation, L CVC removed; R CVC placed 12/22: Failed SBT d/t wob 12/23: Bedside tracheostomy  12/24 Impella 5 5 was removed in OR, spiked fever with Tmax 101.1, Precedex  stopped, remained off pressors on dobutamine  at 2.5  Interim History / Subjective:  No overnight issues Tolerated spontaneous breathing trial for about an hour before becoming tachypneic, tachycardic with increased work of breathing Dobutamine  was stopped Off sedation  Objective    Blood pressure (!) 161/82, pulse (!) 204, temperature 99.3 F (37.4 C), resp. rate (!) 25, height 5' 10 (1.778 m), weight 77.8 kg, SpO2 97%. CVP:  [2 mmHg-23 mmHg] 8 mmHg  Vent Mode: PRVC FiO2 (%):  [40 %] 40 % Set Rate:  [20 bmp] 20 bmp Vt Set:  [580 mL] 580 mL PEEP:  [  5 cmH20] 5 cmH20 Pressure Support:  [5 cmH20-8 cmH20] 8 cmH20 Plateau Pressure:  [15 cmH20-25 cmH20] 15 cmH20   Intake/Output Summary (Last 24 hours) at 12/17/2024 1406 Last data filed at 12/17/2024 1200 Gross per 24 hour  Intake 2177.34 ml  Output 3270 ml  Net -1092.66 ml   Filed  Weights   12/14/24 0358 12/16/24 0500 12/17/24 0600  Weight: 73.4 kg 79.3 kg 77.8 kg       Physical exam: General: Acute on chronically ill-appearing male, lying on the bed HEENT: [place]/AT, eyes anicteric.  Status post trach Neuro: Eyes open, following simple commands Chest: Tachypneic, coarse breath sounds, no wheezes or rhonchi Heart: Tachycardic, regular rhythm, no murmurs or gallops Abdomen: Laparotomy incision looks clean and dry, wound VAC in place, ostomy with dark liquid  Labs reviewed  Patient Lines/Drains/Airways Status     Active Line/Drains/Airways     Name Placement date Placement time Site Days   Arterial Line 12/06/24 Right Radial 12/06/24  --  Radial  11   PICC Triple Lumen 12/04/24 Right Brachial 40 cm 0 cm 12/04/24  1623  -- 13   CVC Triple Lumen 12/13/24 Right Internal jugular 12/13/24  1600  -- 4   Chest Tube 1 Right Pleural 28 Fr. 12/10/24  1951  Pleural  7   Negative Pressure Wound Therapy Abdomen Medial 12/10/24  0730  --  7   NG/OG Vented/Dual Lumen 14 Fr. Left nare 75 cm 12/16/24  0203  Left nare  1   Ileostomy Standard (end) RLQ 12/08/24  1352  RLQ  9   Urethral Catheter Jeronimo Cruz Reyes RN Temperature probe 12/03/24  2345  Temperature probe  14   Tracheostomy Shiley Flexible 6 mm Cuffed 12/15/24  1143  6 mm  2   Wound 11/30/24 1512 Surgical Closed Surgical Incision Chest Other (Comment) 11/30/24  1512  Chest  17   Wound 11/30/24 1512 Surgical Closed Surgical Incision Chest Right 11/30/24  1512  Chest  17   Wound 12/05/24 1000 Pressure Injury Buttocks Mid Deep Tissue Pressure Injury - Purple or maroon localized area of discolored intact skin or blood-filled blister due to damage of underlying soft tissue from pressure and/or shear. 12/05/24  1000  Buttocks  12   Wound 12/05/24 1033  Back Left;Upper 12/05/24  1033  Back  12   Wound 12/08/24 1408 Surgical Closed Surgical Incision Abdomen Other (Comment) 12/08/24  1408  Abdomen  9            Resolved  problem list  AKI Lactic acidosis, resolved Constipation Hyponatremia Assessment and Plan  Acute biventricular HFrEF with cardiogenic shock status post Impella 5.5, removed on 12/24 Severe mitral regurgitation and aortic insufficiency status post bioprosthetic mitral valve and aortic valve replacement Nonsustained VT's, no more episodes Right-sided hemothorax status post IR embolization and now status post VATS Acute respiratory failure with hypoxia and hypercapnia status post trach Sepsis with septic shock due to recurrent aspiration pneumonia with Enterobacter Moderate circumferential pericardial effusion Ischemic bowel status post laparotomy with partial ileum resection end ileostomy creation Postop ileus Acute septic encephalopathy Shock liver Hypertriglyceridemia due to propofol  infusion Alcohol abuse Anemia of critical illness  Heart failure and TCTS teams following Impella 5 5 was removed yesterday, patient tolerated well He was on dobutamine  at 2.5 which was stopped by advanced heart failure this morning Remain in sinus rhythm, no more episodes of VT's Continue amiodarone  Continue IV heparin  infusion Tolerated pressure support trial for 1 hour, before he  became tachycardic, tachypneic with increased work of breathing Continue lung protective ventilation Hypercapnia has cleared VAP prevention bundle in place Off sedation Continue IV antibiotic with cefepime  to complete 7 days therapy After Precedex  was stopped, he became afebrile White count is stable around 12 Monitor LFTs Continue Seroquel  Continue trickle tube feeds, per general surgery slowly increase up to 20 mL/h Continue TPN Completed therapy with alcohol withdrawal Continue thiamine  Monitor H&H and transfuse if less than 8    sodium chloride  20 mL/hr at 12/11/24 0312   amiodarone  30 mg/hr (12/17/24 1100)   ceFEPime  (MAXIPIME ) IV Stopped (12/17/24 0551)   feeding supplement (VITAL 1.5 CAL) 10 mL/hr at  12/17/24 1100   heparin  Stopped (12/16/24 0537)   micafungin  (MYCAMINE ) 100 mg in sodium chloride  0.9 % 100 mL IVPB Stopped (12/16/24 1757)   TPN ADULT (ION) 50 mL/hr at 12/17/24 1100   TPN ADULT (ION)      The patient is critically ill due to acute respiratory failure with hypoxia and hypercapnia/septic/cardiogenic shock.  Critical care was necessary to treat or prevent imminent or life-threatening deterioration.  Critical care was time spent personally by me on the following activities: development of treatment plan with patient and/or surrogate as well as nursing, discussions with consultants, evaluation of patient's response to treatment, examination of patient, obtaining history from patient or surrogate, ordering and performing treatments and interventions, ordering and review of laboratory studies, ordering and review of radiographic studies, pulse oximetry, re-evaluation of patient's condition and participation in multidisciplinary rounds.   During this encounter critical care time was devoted to patient care services described in this note for 38 minutes.     Valinda Novas, MD [facility] See Amion for pager If no response to pager, please call [phone] until 7pm After 7pm, Please call E-link [phone]  "

## 2024-12-17 NOTE — Progress Notes (Signed)
 PHARMACY - TOTAL PARENTERAL NUTRITION CONSULT NOTE   Indication: massive bowel resection   Patient Measurements: Height: 5' 10 (177.8 cm) Weight: (P) 77.8 kg (171 lb 8.3 oz) IBW/kg (Calculated) : 73 TPN AdjBW (KG): 73.4 Body mass index is 24.61 kg/m (pended). Usual Weight: weight 79kg on admission, unclear baseline.   Assessment:  43 yo M s/p aortic and mitral valve replacement with Impella on 12/8. Post-op complicated by persistent HgB drop with abdominal pain, found to have hemorrhage from R intercostal artery s/p chest tube and pneumatosis and concern for bowel ischemia. Patient went to OR on 12/12 for ex-lap found to have ischemia of distal ileum over 75cm without perforation. Patient was left in discontinuity and continued to require 3 vasopressors post-op, concerning for ongoing ischemia. Patient with diet throughout admission however noted nausea and abdominal pain during peri-op time period. On 12/21, tube feeds attempte but patient with vomiting so held again. Pharmacy consulted to manage TPN.   Glucose / Insulin : no hx DM, A1c 6.1%, BG < 120, 0 insulin  used  Electrolytes: Na 142 (none in TPN), CoCa 10.44, Mg 2 (received 2g), others wnl Renal: SCr 0.47 (BL SCr ~ 0.7), BUN down 21 12/15 Lasix  60 mg IV x 2  12/16-12/19 Lasix  80 mg IV x BID 12/20 Lasix  80 mg IV x1 Hepatic: AST/ALT 173/248 stable, tbili 1.6 down, alk phos 166 up, TG 232, albumin  2.2 Intake / Output; MIVF: UOP 2035 ml, NG 100 mL, drains 50ml, chest tube 30 mL, stool 350 mL, Net IO Since Admission: -13,621.83 mL [12/17/24 0654]  GI Imaging: 12/11 CT: pneumatosis of small bowel with gas, concerning for bowel ischemia  12/12 KUB: Diffuse gaseous distention of colon evident, similar to prior. 12/17 CT: Status post right colectomy with right periumbilical ileostomy, Radiopaque material layering within the gallbladder, Moderate soft tissue edema of the abdomen and pelvis.  12/22 KUB: Nonobstructive bowel gas  pattern. 12/25 KUB: mild SB Dilation, Nonobstructive bowel gas pattern GI Surgeries / Procedures:  12/12 ex-lap, distal ileum with necrosis without perforation, 75cm small bowel resection, left in discontinuity, VAC placed  12/14 ex lap, terminal ileum and ascending colon had patchy necrosis, right colectomy, abdomen left open, placement of wound vac,  12/16 dusky at end of small bowel, small bowel resection, end ileostomy, closure  12/19 Cortrak placed  Central access: PICC placed 12/04/24 TPN start date: 12/04/24  Nutritional Goals:  Goal concentrated TPN rate is 70 mL/hr, provides 133 g protein, 2257 kcals   RD Estimated Needs Total Energy Estimated Needs: 2200-2400kcals Total Protein Estimated Needs: 125-150 g Total Fluid Estimated Needs: 1.8 L  Current Nutrition:  TPN + NPO  12/20 CLD, TF 30 ml/hr- ostomy not working yet >> emesis > Hold TF  12/21 NGT with dark thick, bilious effluent 12/22 thin brown stool from ostomy  12/23 propofol  at 26 ml/hr, Remove lipids from TPN  12/24 TF @ 80ml/hr, no titration for at least 24hr 12/25 TF @ 31ml/hr, per surgery consider slow uptitration by 20/h daily until goal rate if residual <500cc; propofol  off and plan to stay off   Plan:  Add back lipids to concentrated TPN at goal of 70 ml/h, provides 100% estimated needs  Electrolytes in TPN: Na 0 mEq/L, K 27 mEq/L, decrease Ca 2 mEq/L, Mg 12 mEq/L, Phos 8 mmol/L, max acetate  Add standard MVI and trace elements to TPN Stop sensitive SSI Q6H, decrease to Q12hr BG checks  Monitor TPN labs on Mon/Thurs and PRN F/u tube feed tolerance  and titration, wean TPN as able   Thank you for allowing pharmacy to be a part of this patients care.  Jinnie Door, PharmD, BCPS, BCCP Clinical Pharmacist  Please check AMION for all [facility] phone numbers After 10:00 PM, call Main Pharmacy [phone]

## 2024-12-17 NOTE — Progress Notes (Signed)
 EVENING ROUNDS NOTE :     [address] [employer] [address]             [id number]               1 Day Post-Op Procedures (LRB): REMOVAL, CARDIAC ASSIST DEVICE, IMPELLA (Right)   Total Length of Stay:  LOS: 24 days  Events:   No events    BP 118/85   Pulse 100   Temp 99.5 F (37.5 C)   Resp (!) 26   Ht 5' 10 (1.778 m)   Wt 77.8 kg   SpO2 98%   BMI 24.61 kg/m   CVP:  [2 mmHg-23 mmHg] 10 mmHg  Vent Mode: PRVC FiO2 (%):  [40 %] 40 % Set Rate:  [20 bmp] 20 bmp Vt Set:  [580 mL] 580 mL PEEP:  [5 cmH20] 5 cmH20 Pressure Support:  [5 cmH20-8 cmH20] 8 cmH20 Plateau Pressure:  [15 cmH20-26 cmH20] 26 cmH20   sodium chloride  20 mL/hr at 12/17/24 1412   amiodarone  30 mg/hr (12/17/24 1500)   ceFEPime  (MAXIPIME ) IV 2 g (12/17/24 1505)   feeding supplement (VITAL 1.5 CAL) 1,000 mL (12/17/24 1745)   micafungin  (MYCAMINE ) 100 mg in sodium chloride  0.9 % 100 mL IVPB 100 mg (12/17/24 1732)   TPN ADULT (ION) 75 mL/hr at 12/17/24 1832    I/O last 3 completed shifts: In: 2892.1 [I.V.:2188.3; NG/GT:248.8; IV Piggyback:455] Out: 4885 [Urine:3710; Emesis/NG output:100; Drains:50; Stool:975; Blood:20; Chest Tube:30]      Latest Ref Rng & Units 12/17/2024    4:52 AM 12/16/2024   11:56 AM 12/16/2024    5:29 AM  CBC  WBC 4.0 - 10.5 K/uL 12.0     Hemoglobin 13.0 - 17.0 g/dL 8.1  8.8  8.5   Hematocrit 39.0 - 52.0 % 24.8  26.0  25.0   Platelets 150 - 400 K/uL 375          Latest Ref Rng & Units 12/17/2024    4:52 AM 12/16/2024   11:56 AM 12/16/2024    5:29 AM  BMP  Glucose 70 - 99 mg/dL 885     BUN 6 - 20 mg/dL 21     Creatinine 9.38 - 1.24 mg/dL 9.52     Sodium 864 - 854 mmol/L 142  144  144   Potassium 3.5 - 5.1 mmol/L 4.2  4.9  5.4   Chloride 98 - 111 mmol/L 113     CO2 22 - 32 mmol/L 22     Calcium  8.9 - 10.3 mg/dL 9.0       ABG    Component Value Date/Time   PHART 7.41 12/17/2024 0453   PCO2ART 35 12/17/2024 0453   PO2ART 159 (H)  12/17/2024 0453   HCO3 22.2 12/17/2024 0453   TCO2 24 12/16/2024 1156   ACIDBASEDEF 1.9 12/17/2024 0453   O2SAT 99.1 12/17/2024 0453   O2SAT 88.2 12/17/2024 0453       Linnie Rayas, MD 12/17/2024 7:49 PM

## 2024-12-17 NOTE — Plan of Care (Signed)
 Problem: Education: Goal: Knowledge of General Education information will improve Description: Including pain rating scale, medication(s)/side effects and non-pharmacologic comfort measures Outcome: Progressing   Problem: Health Behavior/Discharge Planning: Goal: Ability to manage health-related needs will improve Outcome: Progressing   Problem: Clinical Measurements: Goal: Ability to maintain clinical measurements within normal limits will improve Outcome: Progressing Goal: Will remain free from infection Outcome: Progressing Goal: Diagnostic test results will improve Outcome: Progressing Goal: Respiratory complications will improve Outcome: Progressing Goal: Cardiovascular complication will be avoided Outcome: Progressing   Problem: Activity: Goal: Risk for activity intolerance will decrease Outcome: Progressing   Problem: Nutrition: Goal: Adequate nutrition will be maintained Outcome: Progressing   Problem: Coping: Goal: Level of anxiety will decrease Outcome: Progressing   Problem: Elimination: Goal: Will not experience complications related to bowel motility Outcome: Progressing Goal: Will not experience complications related to urinary retention Outcome: Progressing   Problem: Pain Managment: Goal: General experience of comfort will improve and/or be controlled Outcome: Progressing   Problem: Safety: Goal: Ability to remain free from injury will improve Outcome: Progressing   Problem: Skin Integrity: Goal: Risk for impaired skin integrity will decrease Outcome: Progressing   Problem: Education: Goal: Ability to demonstrate management of disease process will improve Outcome: Progressing Goal: Ability to verbalize understanding of medication therapies will improve Outcome: Progressing Goal: Individualized Educational Video(s) Outcome: Progressing   Problem: Activity: Goal: Capacity to carry out activities will improve Outcome: Progressing    Problem: Cardiac: Goal: Ability to achieve and maintain adequate cardiopulmonary perfusion will improve Outcome: Progressing   Problem: Education: Goal: Understanding of CV disease, CV risk reduction, and recovery process will improve Outcome: Progressing Goal: Individualized Educational Video(s) Outcome: Progressing   Problem: Activity: Goal: Ability to return to baseline activity level will improve Outcome: Progressing   Problem: Cardiovascular: Goal: Ability to achieve and maintain adequate cardiovascular perfusion will improve Outcome: Progressing Goal: Vascular access site(s) Level 0-1 will be maintained Outcome: Progressing   Problem: Health Behavior/Discharge Planning: Goal: Ability to safely manage health-related needs after discharge will improve Outcome: Progressing   Problem: Education: Goal: Understanding of CV disease, CV risk reduction, and recovery process will improve Outcome: Progressing Goal: Individualized Educational Video(s) Outcome: Progressing   Problem: Activity: Goal: Ability to return to baseline activity level will improve Outcome: Progressing   Problem: Cardiovascular: Goal: Ability to achieve and maintain adequate cardiovascular perfusion will improve Outcome: Progressing Goal: Vascular access site(s) Level 0-1 will be maintained Outcome: Progressing   Problem: Health Behavior/Discharge Planning: Goal: Ability to safely manage health-related needs after discharge will improve Outcome: Progressing   Problem: Education: Goal: Will demonstrate proper wound care and an understanding of methods to prevent future damage Outcome: Progressing Goal: Knowledge of disease or condition will improve Outcome: Progressing Goal: Knowledge of the prescribed therapeutic regimen will improve Outcome: Progressing Goal: Individualized Educational Video(s) Outcome: Progressing   Problem: Activity: Goal: Risk for activity intolerance will  decrease Outcome: Progressing   Problem: Cardiac: Goal: Will achieve and/or maintain hemodynamic stability Outcome: Progressing   Problem: Clinical Measurements: Goal: Postoperative complications will be avoided or minimized Outcome: Progressing   Problem: Respiratory: Goal: Respiratory status will improve Outcome: Progressing   Problem: Skin Integrity: Goal: Wound healing without signs and symptoms of infection Outcome: Progressing Goal: Risk for impaired skin integrity will decrease Outcome: Progressing   Problem: Urinary Elimination: Goal: Ability to achieve and maintain adequate renal perfusion and functioning will improve Outcome: Progressing   Problem: Cardiac: Goal: Ability to achieve and maintain adequate cardiopulmonary perfusion  will improve Outcome: Progressing Goal: Vascular access site(s) Level 0-1 will be maintained Outcome: Progressing   Problem: Fluid Volume: Goal: Ability to achieve a balanced intake and output will improve Outcome: Progressing

## 2024-12-17 NOTE — Progress Notes (Signed)
 "  Trauma/Critical Care Follow Up Note  Subjective:    Overnight Issues:   Objective:  Vital signs for last 24 hours: Temp:  [98.2 F (36.8 C)-100.8 F (38.2 C)] 99.7 F (37.6 C) (12/25 0115) Pulse Rate:  [74-116] 107 (12/25 0115) Resp:  [10-40] 26 (12/25 0115) BP: (105-160)/(70-96) 128/83 (12/25 0100) SpO2:  [90 %-100 %] 97 % (12/25 0115) Arterial Line BP: (130-193)/(52-82) 130/82 (12/25 0115) FiO2 (%):  [40 %-60 %] 40 % (12/25 0300)  Intake/Output from previous day: 12/24 0701 - 12/25 0700 In: 1852.4 [I.V.:1368.6; NG/GT:128.8; IV Piggyback:355] Out: 2585 [Urine:2035; Emesis/NG output:100; Drains:50; Stool:350; Blood:20; Chest Tube:30]  Intake/Output this shift: Total I/O In: 859.6 [I.V.:669.6; NG/GT:90; IV Piggyback:100] Out: 1035 [Urine:835; Stool:200]  Vent settings for last 24 hours: Vent Mode: PRVC FiO2 (%):  [40 %-60 %] 40 % Set Rate:  [20 bmp] 20 bmp Vt Set:  [580 mL] 580 mL PEEP:  [5 cmH20] 5 cmH20 Plateau Pressure:  [22 cmH20-25 cmH20] 25 cmH20  Physical Exam:  Gen: comfortable, no distress Neuro: follows commands HEENT: PERRL Neck: supple CV: RRR Pulm: unlabored breathing on mechanical ventilation-full support Abd: soft, NT, midline vac in place , ostomy productive and mucoid stool from rectum GU: urine clear and yellow, +spontaneous voids Extr: wwp, no edema  Results for orders placed or performed during the hospital encounter of 11/22/24 (from the past 24 hours)  Glucose, capillary     Status: Abnormal   Collection Time: 12/16/24 11:54 AM  Result Value Ref Range   Glucose-Capillary 112 (H) 70 - 99 mg/dL  I-STAT 7, (LYTES, BLD GAS, ICA, H+H)     Status: Abnormal   Collection Time: 12/16/24 11:56 AM  Result Value Ref Range   pH, Arterial 7.409 7.35 - 7.45   pCO2 arterial 37.0 32 - 48 mmHg   pO2, Arterial 150 (H) 83 - 108 mmHg   Bicarbonate 23.4 20.0 - 28.0 mmol/L   TCO2 24 22 - 32 mmol/L   O2 Saturation 99 %   Acid-base deficit 1.0 0.0 - 2.0  mmol/L   Sodium 144 135 - 145 mmol/L   Potassium 4.9 3.5 - 5.1 mmol/L   Calcium , Ion 1.29 1.15 - 1.40 mmol/L   HCT 26.0 (L) 39.0 - 52.0 %   Hemoglobin 8.8 (L) 13.0 - 17.0 g/dL   Patient temperature 62.7 C    Sample type ARTERIAL   Glucose, capillary     Status: Abnormal   Collection Time: 12/16/24  5:44 PM  Result Value Ref Range   Glucose-Capillary 106 (H) 70 - 99 mg/dL  Glucose, capillary     Status: Abnormal   Collection Time: 12/16/24 11:26 PM  Result Value Ref Range   Glucose-Capillary 107 (H) 70 - 99 mg/dL  Glucose, capillary     Status: Abnormal   Collection Time: 12/17/24  4:23 AM  Result Value Ref Range   Glucose-Capillary 112 (H) 70 - 99 mg/dL  Lactate dehydrogenase (LDH)     Status: Abnormal   Collection Time: 12/17/24  4:52 AM  Result Value Ref Range   LDH 720 (H) 105 - 235 U/L  Phosphorus     Status: None   Collection Time: 12/17/24  4:52 AM  Result Value Ref Range   Phosphorus 3.1 2.5 - 4.6 mg/dL  Magnesium      Status: None   Collection Time: 12/17/24  4:52 AM  Result Value Ref Range   Magnesium  2.0 1.7 - 2.4 mg/dL  Triglycerides     Status: Abnormal  Collection Time: 12/17/24  4:52 AM  Result Value Ref Range   Triglycerides 232 (H) <150 mg/dL  Comprehensive metabolic panel     Status: Abnormal   Collection Time: 12/17/24  4:52 AM  Result Value Ref Range   Sodium 142 135 - 145 mmol/L   Potassium 4.2 3.5 - 5.1 mmol/L   Chloride 113 (H) 98 - 111 mmol/L   CO2 22 22 - 32 mmol/L   Glucose, Bld 114 (H) 70 - 99 mg/dL   BUN 21 (H) 6 - 20 mg/dL   Creatinine, Ser 9.52 (L) 0.61 - 1.24 mg/dL   Calcium  9.0 8.9 - 10.3 mg/dL   Total Protein 6.2 (L) 6.5 - 8.1 g/dL   Albumin  2.2 (L) 3.5 - 5.0 g/dL   AST 826 (H) 15 - 41 U/L   ALT 248 (H) 0 - 44 U/L   Alkaline Phosphatase 166 (H) 38 - 126 U/L   Total Bilirubin 1.6 (H) 0.0 - 1.2 mg/dL   GFR, Estimated >39 >39 mL/min   Anion gap 8 5 - 15  CBC     Status: Abnormal   Collection Time: 12/17/24  4:52 AM  Result Value  Ref Range   WBC 12.0 (H) 4.0 - 10.5 K/uL   RBC 2.73 (L) 4.22 - 5.81 MIL/uL   Hemoglobin 8.1 (L) 13.0 - 17.0 g/dL   HCT 75.1 (L) 60.9 - 47.9 %   MCV 90.8 80.0 - 100.0 fL   MCH 29.7 26.0 - 34.0 pg   MCHC 32.7 30.0 - 36.0 g/dL   RDW 81.6 (H) 88.4 - 84.4 %   Platelets 375 150 - 400 K/uL   nRBC 0.8 (H) 0.0 - 0.2 %  Blood gas, arterial     Status: Abnormal   Collection Time: 12/17/24  4:53 AM  Result Value Ref Range   pH, Arterial 7.41 7.35 - 7.45   pCO2 arterial 35 32 - 48 mmHg   pO2, Arterial 159 (H) 83 - 108 mmHg   Bicarbonate 22.2 20.0 - 28.0 mmol/L   Acid-base deficit 1.9 0.0 - 2.0 mmol/L   O2 Saturation 99.1 %   Patient temperature 37.3    Collection site RARM    Drawn by KOFFI    Allens test (pass/fail) PASSED PASS  Cooxemetry Panel (carboxy, met, total hgb, O2 sat)     Status: Abnormal   Collection Time: 12/17/24  4:53 AM  Result Value Ref Range   Total hemoglobin 8.0 (L) 12.0 - 16.0 g/dL   O2 Saturation 11.7 %   Carboxyhemoglobin 2.4 (H) 0.5 - 1.5 %   Methemoglobin <0.7 0.0 - 1.5 %    Assessment & Plan:  LOS: 24 days   Additional comments:I reviewed the patient's new clinical lab test results.   and I reviewed the patients new imaging test results.    43 y/o M S/P AVR, MCR, Impella placement by Dr. Lucas 12/8   S/P right thoracentesis 12/11 with hemorrhage from intercostal -chest tube placed by TCTS.  S/p emergent IR embolizaton  R intercostal artery by Dr. Philip.    S/P VATS 12/18    Pneumatosis of the pelvic small bowel and R colon with portal venous gas on CT  POD 13/11/9 status post ex lap with SBR & VAC placement, repeat laparotomy with patchy small bowel ischemia, end ileostomy/ab closure - WBC 12 - woc following - vac changes twice weekly - resume trickle TF 12/24 at 10/h, check gastric residual today and consider slow uptitration by 20/h daily  until goal rate and as long as residual <500cc. Check residuals no more than once per day. Continue TPN until goal  rate reached.  - Impella removal 12/24   FEN: NPO, IVF per primary, TPN, TF as above ID: cefepime , micafungin ; resp cx 12/18 with few C albican, BCx 12/17 neg, BAL 12/23 P VTE: heparin  gtt Foley: in place Dispo: ICU  Dreama GEANNIE Hanger, MD Trauma & General Surgery Please use [email or web address] to contact on call provider  12/17/2024  *Care during the described time interval was provided by me. I have reviewed this patient's available data, including medical history, events of note, physical examination and test results as part of my evaluation.    "

## 2024-12-17 NOTE — Progress Notes (Signed)
 Patient ID: Alejandro Lopez, male   DOB: [birth year], 43 y.o.   MRN: [id number]     Advanced Heart Failure Rounding Note  Cardiologist: None  Chief Complaint: Valvular Heart Disease & HFrEF Patient Profile  43 y.o. male w/ h/o heavy alcohol use (at least 5 drinks per evening) and h/o asthma admitted w/ acute systolic heart failure. Strong family history of cardiac disease: mother and maternal grandmother had heart failure and father with CAD. Admitted with acute HFrEF/cardiogenic shock. Echo w/ severe AR, MR and LV dysfunction.    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  12/9 VT/VF ? vagal episode. Extubated 12/11: S/p R thoracentesis with resultant hemothorax.  Chest tube placed. S/p R intercostal artery coil (IR). S/p exp lap for ischemic bowel resection, bowel left in discontinuity 12/15: Back to OR for ischemic R colon and mesenteric ischemia. S/p exp/lap, wound vac exchange, R colectomy without anastomosis.  12/16 Back to OR for small bowel resection and ileostomy  12/18 OR for VATS/hemothorax washout, pericardial window was not done.  12/19 extubated 12/21 Reintubated. Ileus 12/23 S/p Trach. Fever post procedure, Tmax 101. Abx switched to cefepime    12/24 Impella removed  Subjective:    Impella removed yesterday. On DBA 2.5. Co-ox stable CVP low  On vent through trach. Remains tachypneic   Objective:   Weight Range: 77.8 kg Body mass index is 24.61 kg/m.   Vital Signs:   Temp:  [98.4 F (36.9 C)-100.9 F (38.3 C)] 99.5 F (37.5 C) (12/25 1948) Pulse Rate:  [90-215] 106 (12/25 1948) Resp:  [10-38] 25 (12/25 1948) BP: (106-178)/(61-115) 118/85 (12/25 1948) SpO2:  [97 %-100 %] 98 % (12/25 1948) Arterial Line BP: (110-193)/(60-82) 110/82 (12/25 1800) FiO2 (%):  [40 %] 40 % (12/25 1948) Weight:  [77.8 kg] 77.8 kg (12/25 0600) Last BM Date : 12/17/24  Weight change: Filed Weights   12/14/24 0358 12/16/24 0500 12/17/24 0600   Weight: 73.4 kg 79.3 kg 77.8 kg    Intake/Output:   Intake/Output Summary (Last 24 hours) at 12/17/2024 2029 Last data filed at 12/17/2024 1738 Gross per 24 hour  Intake 1731.85 ml  Output 2900 ml  Net -1168.15 ml     Physical Exam   General:  Sitting up in bed.On vent through trach mildly tachypneic HEENT: normal Neck: supple. no JVD.  Cor: Regular rate & rhythm. No rubs, gallops or murmurs.Impella site ok  Lungs: clear Abdomen: soft, nontender, nondistended.Good bowel sounds. + ostomy ok  Extremities: no cyanosis, clubbing, rash, edema Neuro: alert & orientedx3, cranial nerves grossly intact. moves all 4 extremities w/o difficulty. Affect pleasan   Telemetry   NSR 90-100, personally reviewed   Labs    CBC Recent Labs    12/15/24 0430 12/15/24 0437 12/16/24 0517 12/16/24 0529 12/16/24 1156 12/17/24 0452  WBC 14.1*  --  12.0*  --   --  12.0*  NEUTROABS 10.9*  --  8.3*  --   --   --   HGB 8.6*   < > 8.5*   < > 8.8* 8.1*  HCT 25.4*   < > 25.6*   < > 26.0* 24.8*  MCV 90.1  --  90.5  --   --  90.8  PLT 317  --  352  --   --  375   < > = values in this interval not displayed.   Basic Metabolic Panel Recent Labs    87/75/74 0517 12/16/24 0529 12/16/24 1156  12/17/24 0452  NA 143   < > 144 142  K 4.8   < > 4.9 4.2  CL 113*  --   --  113*  CO2 24  --   --  22  GLUCOSE 107*  --   --  114*  BUN 24*  --   --  21*  CREATININE 0.59*  --   --  0.47*  CALCIUM  8.9  --   --  9.0  MG 1.9  --   --  2.0  PHOS 4.3  --   --  3.1   < > = values in this interval not displayed.   Liver Function Tests Recent Labs    12/15/24 0430 12/17/24 0452  AST 175* 173*  ALT 208* 248*  ALKPHOS 127* 166*  BILITOT 1.9* 1.6*  PROT 6.2* 6.2*  ALBUMIN  2.2* 2.2*   No results for input(s): LIPASE, AMYLASE in the last 72 hours. Cardiac Enzymes No results for input(s): CKTOTAL, CKMB, CKMBINDEX, TROPONINI in the last 72 hours.  BNP: BNP (last 3 results) No results  for input(s): BNP in the last 8760 hours.  ProBNP (last 3 results) Recent Labs    11/22/24 1958  PROBNP 3,354.0*     D-Dimer No results for input(s): DDIMER in the last 72 hours.  Hemoglobin A1C No results for input(s): HGBA1C in the last 72 hours. Fasting Lipid Panel Recent Labs    12/17/24 0452  TRIG 232*    Thyroid Function Tests No results for input(s): TSH, T4TOTAL, T3FREE, THYROIDAB in the last 72 hours.  Invalid input(s): FREET3  Other results:   Imaging    DG CHEST PORT 1 VIEW Result Date: 12/04/2024 CLINICAL DATA:  Ventilator dependence. EXAM: PORTABLE CHEST 1 VIEW COMPARISON:  12/03/2024 FINDINGS: Endotracheal tube tip is approximately 3.8 cm above the base of the carina. The NG tube passes into the stomach although the distal tip position is not included on the film. Right-sided Impella device is stable in position. A left IJ pulmonary artery catheter tip overlies expected location of the interlobar pulmonary artery. Right pleural drain again noted with persistent right pleural fluid. Basilar collapse/consolidation noted, IMPRESSION: 1. No substantial interval change. 2. Persistent right pleural effusion with right base collapse/consolidation. 3. Support apparatus as above. Electronically Signed   By: Camellia Candle M.D.   On: 12/04/2024 07:15   DG Abd 1 View Result Date: 12/04/2024 CLINICAL DATA:   Routine adult health maintenance Best images obtainable due to patient's condition EXAM: ABDOMEN - 1 VIEW COMPARISON:  12/04/2024 FINDINGS: NG tube tip is in the distal stomach. Diffuse gaseous distention of colon evident, similar to prior. IMPRESSION: NG tube tip is in the distal stomach. Electronically Signed   By: Camellia Candle M.D.   On: 12/04/2024 07:11   CT Angio Chest/Abd/Pel for Dissection W and/or W/WO Addendum Date: 12/03/2024 ADDENDUM #1 ADDENDUM: ---------------------------------------------------- These results were called to Dr. Pinkey Mcjunkin  at  10:15 pm on 12/03/2024. Electronically signed by: Franky Crease MD 12/03/2024 10:23 PM EST RP Workstation: [id number]   Result Date: 12/03/2024 ORIGINAL REPORT EXAM: CT CHEST, ABDOMEN AND PELVIS WITH AND WITHOUT CONTRAST 12/03/2024 09:52:07 PM TECHNIQUE: CT of the chest, abdomen and pelvis was performed with and without the administration of intravenous contrast. Multiplanar reformatted images are provided for review. Automated exposure control, iterative reconstruction, and/or weight based adjustment of the mA/kV was utilized to reduce the radiation dose to as low as reasonably achievable. COMPARISON: None available. CLINICAL HISTORY: Acute aortic syndrome (AAS)  suspected; Evaluate for intercostal bleeding or other sources after thoracentesis; also evaluate bowel. FINDINGS: CHEST: MEDIASTINUM AND LYMPH NODES: Mild cardiomegaly. Impella device tip in the left ventricle. Swan-ganz catheter tip in the central right pulmonary artery. No evidence of aortic aneurysm or dissection. The central airways are clear. No mediastinal, hilar or axillary lymphadenopathy. LUNGS AND PLEURA: Right chest tube in place. Moderate to large right pleural effusion with compressive atelectasis in the right middle lobe and right lower lobe. There appears to be active extravasation of contrast posteriorly between the right 9th and 10th ribs and likely into the pleural space, possibly related to prior thoracentesis. The pleural fluid appears complex on the right, likely hemothorax. Small left pleural effusion with compressive atelectasis in the left lower lobe. Tiny right pneumothorax. Biapical subpleural blebs. No evidence of pulmonary embolus. ABDOMEN AND PELVIS: LIVER: Gas is seen branching peripherally in the liver compatible with portal venous gas. GALLBLADDER AND BILE DUCTS: Gallbladder is unremarkable. No biliary ductal dilatation. SPLEEN: No acute abnormality. PANCREAS: No acute abnormality. ADRENAL GLANDS: No acute abnormality.  KIDNEYS, URETERS AND BLADDER: No stones in the kidneys or ureters. No hydronephrosis. No perinephric or periureteral stranding. Urinary bladder is unremarkable. GI AND BOWEL: Stomach and small bowel are decompressed. Diffuse gaseous distention of the colon suggests ileus. Moderate stool burden in the rectosigmoid colon. Pneumatosis noted in small bowel loops in the lower pelvis with air in the mesenteric veins feeding these small bowel loops. Pneumatosis also noted in the right colon wall. REPRODUCTIVE ORGANS: No acute abnormality. PERITONEUM AND RETROPERITONEUM: No ascites. No free air. VASCULATURE: Aorta is normal in caliber. ABDOMINAL AND PELVIS LYMPH NODES: No lymphadenopathy. BONES AND SOFT TISSUES: No acute osseous abnormality. No focal soft tissue abnormality. IMPRESSION: 1. Active contrast extravasation between the right 9th and 10th ribs with likely extension into the pleural space, suspicious for ongoing intercostal bleeding likely related to recent thoracentesis. 2. Moderate to large right pleural effusion compatible with hemothorax with compressive atelectasis in the right middle and lower lobes; right chest tube present; tiny right pneumothorax. 3. No evidence of aortic aneurysm, aortic dissection, or pulmonary embolus. 4. Pneumatosis involving small bowel loops in the lower pelvis and the right colon with mesenteric venous gas and portal venous gas, concerning for bowel ischemia. Recommend surgical consultation. 5. Small left pleural effusion with compressive atelectasis in the left lower lobe. 6. . Attempts are being made to contact the ordering physician with the results. An addendum will be made at that time. Electronically signed by: Franky Crease MD 12/03/2024 10:09 PM EST RP Workstation: [id number]   DG Chest Port 1 View Result Date: 12/03/2024 EXAM: 1 VIEW(S) XRAY OF THE CHEST 12/03/2024 07:57:00 PM COMPARISON: Portable chest today at 7:02 pm. CLINICAL HISTORY: Encounter for chest tube  placement. FINDINGS: LINES, TUBES AND DEVICES: A pigtail chest tube has been inserted on the right through the 6th intercostal space, with the pigtail in the lateral upper right thorax. Left IJ Swan-Ganz line again terminates in the distal right pulmonary artery. Stable positioning of right subclavian Impella device. LUNGS AND PLEURA: No pneumothorax. Moderate to large right pleural effusion is still present but decreased in the interval. Small left pleural effusion unchanged. The right mid to lower lung is obscured by pleural fluid. There is patchy consolidation in the left lower lung field. Left mid and both apical lungs are clear. HEART AND MEDIASTINUM: Sternotomy sutures and two cardiac valve replacements are again shown. There is mild cardiomegaly and mild central vascular prominence as  before. BONES AND SOFT TISSUES: No acute osseous abnormality. IMPRESSION: 1. Right pigtail chest tube in position with no measurable pneumothorax. 2. Moderate to large right pleural effusion, decreased compared to earlier today. 3. Patchy consolidation in the left lower lung field. 4. Small left pleural effusion, unchanged. 5. Cardiomegaly . Electronically signed by: Francis Quam MD 12/03/2024 08:10 PM EST RP Workstation: [id number]   DG CHEST PORT 1 VIEW Result Date: 12/03/2024 EXAM: 1 VIEW(S) XRAY OF THE CHEST 12/03/2024 07:05:00 PM COMPARISON: None available. CLINICAL HISTORY: ABLA (acute blood loss anemia). FINDINGS: LINES, TUBES AND DEVICES: Impella device and Swan-Ganz catheter remain in place, unchanged. LUNGS AND PLEURA: Enlarging right pleural effusion, now large with atelectasis throughout the right lung. Vascular congestion and left basilar atelectasis. Low lung volumes. No pneumothorax. HEART AND MEDIASTINUM: No acute abnormality of the cardiac and mediastinal silhouettes. BONES AND SOFT TISSUES: No acute osseous abnormality. IMPRESSION: 1. Enlarging right pleural effusion, now large, with associated right lung  atelectasis. 2. Vascular congestion with left basilar atelectasis. 3. Low lung volumes. Electronically signed by: Franky Crease MD 12/03/2024 07:10 PM EST RP Workstation: [id number]   DG Chest Port 1 View Result Date: 12/03/2024 CLINICAL DATA:  Pleural effusion, status post thoracentesis EXAM: PORTABLE CHEST 1 VIEW COMPARISON:  12/03/2024 FINDINGS: Single frontal view of the chest demonstrates stable left internal jugular flow directed central venous catheter, tip overlying the right infrahilar region. Stable mitral and aortic valve prostheses. Cardiac silhouette remains enlarged. Lung volumes are diminished, with bibasilar veiling opacities, right greater than left, consistent with consolidation and effusions. No appreciable change since prior study. No evidence of pneumothorax. IMPRESSION: 1. Persistent bibasilar consolidation and effusions, right greater than left. 2. No evidence of pneumothorax. 3. Stable enlarged cardiac silhouette. Electronically Signed   By: Ozell Daring M.D.   On: 12/03/2024 16:10    Medications:   Scheduled Medications:  arformoterol   15 mcg Nebulization BID   Chlorhexidine  Gluconate Cloth  6 each Topical Daily   colchicine   0.6 mg Per Tube Daily   famotidine   20 mg Per Tube BID   feeding supplement (PROSource TF20)  60 mL Per Tube Daily   folic acid   1 mg Per Tube Daily   Gerhardt's butt cream   Topical TID   lidocaine   1 patch Transdermal Q24H   mouth rinse  15 mL Mouth Rinse Q2H   pantoprazole  (PROTONIX ) IV  40 mg Intravenous Daily   PHENObarbital   32.5 mg Intravenous Q8H   QUEtiapine   25 mg Per Tube QHS   revefenacin   175 mcg Nebulization Daily   sodium chloride  flush  3 mL Intravenous Q12H   thiamine   100 mg Per Tube Daily    Infusions:  sodium chloride  20 mL/hr at 12/17/24 1412   amiodarone  30 mg/hr (12/17/24 1500)   ceFEPime  (MAXIPIME ) IV 2 g (12/17/24 1505)   feeding supplement (VITAL 1.5 CAL) 1,000 mL (12/17/24 1745)   micafungin  (MYCAMINE ) 100 mg in  sodium chloride  0.9 % 100 mL IVPB 100 mg (12/17/24 1732)   TPN ADULT (ION) 75 mL/hr at 12/17/24 1832    PRN Medications: sodium chloride , acetaminophen  (TYLENOL ) oral liquid 160 mg/5 mL, albuterol , fentaNYL  (SUBLIMAZE ) injection, hydrALAZINE , LORazepam , midazolam  PF, morphine  injection, ondansetron  (ZOFRAN ) IV, mouth rinse, oxyCODONE , polyethylene glycol, sodium chloride , sodium chloride  flush  Assessment/Plan  1.  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  Valvular Disease  - severe MR posteriorly directed, mod TR. Likely functional.  - severe AI.-->CMR - Severe AR/MR  - S/P bioprosthetic AVR/MVR  with Impella 5.5 12/8 - Timing of AC per TCTS  2. Acute hypoxic/hypercarbic respiratory failure - reintubated 12/21 due to hypercarbia  - suspect mostly muscle weakness and ab distension +/- PNA - s/p Trach 12/23  3. Acute Systolic Heart Failure w/ Biventricular Dysfunction >> caridogenic shock - HS trop not c/w ACS but EKG w/ anteroseptal Qs  - CMRI Severe AR/MR. LVEF 35%  - Cath- normal cors, RA5, PA 32/17 (24), PVR 2.57 Papi 3. CO 4.9 CI 3.8 - Post-op Echo 12/02/24: EF < 20% with severe LVH (new, ?inflammatory), moderately decreased RV function, stable bioprosthetic MV and AoV.  - Impella removed 12/24  - Hemodynamics stable. Can stop DBA - Volume ok. Holding diuretics - Start GDMT slowly tomorrow  4. Ischemic bowel - S/p exp/lap 12/11. Patchy ischemia of the distal ileum extending over about 75 cm without perforation. Ischemic segment resected.  Colon was distended with gas but viable. Small bowel left in discontinuity.  - Back to OR 12/15 for ischemic R colon and mesenteric ischemia. S/p exp/lap, wound vac exchange, R colectomy without anastomosis.  - Returned to OR 12/16 for ileostomy creation.  - GSU following. Ileostomy working well - Remains on TPN   5. R hemothorax - Hemothorax s/p right thoracentesis with intercostal artery injury. Massive bleeding requiring multiple products and  development of hemorrhagic shock, now improved. S/p IR embolization.  - S/p VATS 12/18 with right chest washout.  - H/H stable  - Chest tube in place  6.  ETOH Abuse - heavy drinker, at least 5 drinks per evening - may be contributing to CM, reduction of intake imperative  - No evidence of withdrawal   7 . DMII  - Hgb A1C 6.1 - On SSI.  8. ID - T max 101 F overnight - BAL w/ enterobacter cloacae and candida albicans. Zosyn  >> meropenem  12/21, vanc off and micafungin  started 12/22 - Cefepime  added 12/23.  9. AKI - AKI with shock.  - Resolved   10. VT - Quiescent - On amiodarone  gtt. Switch to po when able   11. Pericardial effusion - There is a moderate effusion without tamponade, noted again by echo 12/16.  Will need to follow over time.  - Limited echo 12/17 with moderate effusion.  - This was not drained at time of VATS/right hemothorax washout on 12/18. Appeared fibrinous.  12 Atrial flutter - In and out of AFL with controlled rate. Currently NSR - Remains in NSR Continue amiodarone  gtt.  - Heparin  off for Impella removal. Restart once ok w/ CT surgery     CRITICAL CARE Performed by: Toribio Fuel   Total critical care time: 45  minutes  Critical care time was exclusive of separately billable procedures and treating other patients.  Critical care was necessary to treat or prevent imminent or life-threatening deterioration.  Critical care was time spent personally by me on the following activities: development of treatment plan with patient and/or surrogate as well as nursing, discussions with consultants, evaluation of patient's response to treatment, examination of patient, obtaining history from patient or surrogate, ordering and performing treatments and interventions, ordering and review of laboratory studies, ordering and review of radiographic studies, pulse oximetry and re-evaluation of patient's condition.  Toribio Fuel,  MD 12/17/2024

## 2024-12-17 NOTE — Progress Notes (Signed)
 "     [address] [employer] [address]             [phone]                1 Day Post-Op Procedures (LRB): REMOVAL, CARDIAC ASSIST DEVICE, IMPELLA (Right)   Events: Failed vent wean  _______________________________________________________________ Vitals: BP 131/82   Pulse (!) 101   Temp 99.7 F (37.6 C)   Resp (!) 38   Ht 5' 10 (1.778 m)   Wt 77.8 kg   SpO2 100%   BMI 24.61 kg/m  Filed Weights   12/14/24 0358 12/16/24 0500 12/17/24 0600  Weight: 73.4 kg 79.3 kg 77.8 kg     - Neuro: alert NAD  - Cardiovascular: sinus tach  Drips: dob 2.5.  amio   CVP:  [7 mmHg-23 mmHg] 10 mmHg  - Pulm:  Vent Mode: PRVC FiO2 (%):  [40 %-60 %] 40 % Set Rate:  [20 bmp] 20 bmp Vt Set:  [580 mL] 580 mL PEEP:  [5 cmH20] 5 cmH20 Pressure Support:  [5 cmH20] 5 cmH20 Plateau Pressure:  [17 cmH20-25 cmH20] 17 cmH20  ABG    Component Value Date/Time   PHART 7.41 12/17/2024 0453   PCO2ART 35 12/17/2024 0453   PO2ART 159 (H) 12/17/2024 0453   HCO3 22.2 12/17/2024 0453   TCO2 24 12/16/2024 1156   ACIDBASEDEF 1.9 12/17/2024 0453   O2SAT 99.1 12/17/2024 0453   O2SAT 88.2 12/17/2024 0453    - Abd: soft - Extremity: trace edema  .Intake/Output      12/24 0701 12/25 0700 12/25 0701 12/26 0700   I.V. (mL/kg) 1646.6 (21.2) 69.6 (0.9)   Other     NG/GT 168.8 10   IV Piggyback 455    Total Intake(mL/kg) 2270.5 (29.2) 79.6 (1)   Urine (mL/kg/hr) 2535 (1.4) 200 (1.1)   Emesis/NG output 100    Drains 50    Stool 350    Blood 20    Chest Tube 30    Total Output 3085 200   Net -814.6 -120.4           _______________________________________________________________ Labs:    Latest Ref Rng & Units 12/17/2024    4:52 AM 12/16/2024   11:56 AM 12/16/2024    5:29 AM  CBC  WBC 4.0 - 10.5 K/uL 12.0     Hemoglobin 13.0 - 17.0 g/dL 8.1  8.8  8.5   Hematocrit 39.0 - 52.0 % 24.8  26.0  25.0   Platelets 150 - 400 K/uL 375         Latest Ref Rng & Units  12/17/2024    4:52 AM 12/16/2024   11:56 AM 12/16/2024    5:29 AM  CMP  Glucose 70 - 99 mg/dL 885     BUN 6 - 20 mg/dL 21     Creatinine 9.38 - 1.24 mg/dL 9.52     Sodium 864 - 854 mmol/L 142  144  144   Potassium 3.5 - 5.1 mmol/L 4.2  4.9  5.4   Chloride 98 - 111 mmol/L 113     CO2 22 - 32 mmol/L 22     Calcium  8.9 - 10.3 mg/dL 9.0     Total Protein 6.5 - 8.1 g/dL 6.2     Total Bilirubin 0.0 - 1.2 mg/dL 1.6     Alkaline Phos 38 - 126 U/L 166     AST 15 - 41 U/L 173  ALT 0 - 44 U/L 248       CXR: -  _______________________________________________________________  Assessment and Plan: AVR MVR impella 12/8 Xlap small bowel resection 12/12 End ileostomy 12/16 R VATS decort 12/18 Impella removal 12/24   Neuro: pain controlled CV: on amio and dob.  Impella out. Pulm: continue vent wean.  Will remove chest tub today Renal: creat stable GI: on TPN.  Awaiting return of bowel function Heme: stable ID: [email or web address] abx and antifungals Endo: SSI Dispo: continue ICU care   Feiga Nadel O Annell Canty 12/17/2024 9:15 AM   "

## 2024-12-17 NOTE — TOC Progression Note (Signed)
 Transition of Care [facility]) - Progression Note    Patient Details  Name: Alejandro Lopez MRN: [id number] Date of Birth: February 22, 1981  [facility] CM/SW Contact  Justina Delcia Czar, RN Phone Number: [phone] 12/17/2024, 4:28 PM  Clinical Narrative:     Spoke to pt and able to nod yes or no. Pt is interested in rehab. Will need PT/OT recommendations closer to dc.   Patient remains on amiodarone  gtt and TPN.   Chart reviewed for discharge readiness, patient not medically stable for d/c. Inpatient CM/CSW will continue to monitor pt's advancement through interdisciplinary progression rounds. If new pt transition needs arise, MD please place a TOC consult.    Expected Discharge Plan: IP Rehab Facility Barriers to Discharge: Continued Medical Work up    Expected Discharge Plan and Services In-house Referral: Clinical Social Work Discharge Planning Services: CM Consult   Living arrangements for the past 2 months: Single Family Home                                       Social Drivers of Health (SDOH) Interventions SDOH Screenings   Food Insecurity: No Food Insecurity (11/22/2024)  Housing: Low Risk (11/22/2024)  Transportation Needs: No Transportation Needs (11/22/2024)  Utilities: Not At Risk (11/22/2024)  Social Connections: Unknown (05/06/2022)   Received from [facility]  Tobacco Use: High Risk (12/06/2024)    Readmission Risk Interventions     No data to display

## 2024-12-18 ENCOUNTER — Inpatient Hospital Stay (HOSPITAL_COMMUNITY)

## 2024-12-18 ENCOUNTER — Encounter (HOSPITAL_COMMUNITY)
Admission: EM | Disposition: A | Discharge status: Rehabilitation Facility | Payer: Self-pay | Source: Home / Self Care | Attending: Surgery

## 2024-12-18 ENCOUNTER — Encounter (HOSPITAL_COMMUNITY): Payer: Self-pay | Admitting: Surgery

## 2024-12-18 DIAGNOSIS — R451 Restlessness and agitation: Secondary | ICD-10-CM

## 2024-12-18 DIAGNOSIS — I5021 Acute systolic (congestive) heart failure: Secondary | ICD-10-CM | POA: Diagnosis not present

## 2024-12-18 DIAGNOSIS — K567 Ileus, unspecified: Secondary | ICD-10-CM

## 2024-12-18 DIAGNOSIS — Z9911 Dependence on respirator [ventilator] status: Secondary | ICD-10-CM

## 2024-12-18 LAB — BASIC METABOLIC PANEL WITH GFR
Anion gap: 6 (ref 5–15)
BUN: 18 mg/dL (ref 6–20)
CO2: 23 mmol/L (ref 22–32)
Calcium: 9 mg/dL (ref 8.9–10.3)
Chloride: 113 mmol/L — ABNORMAL HIGH (ref 98–111)
Creatinine, Ser: 0.46 mg/dL — ABNORMAL LOW (ref 0.61–1.24)
GFR, Estimated: >60 mL/min
Glucose, Bld: 109 mg/dL — ABNORMAL HIGH (ref 70–99)
Potassium: 4 mmol/L (ref 3.5–5.1)
Sodium: 142 mmol/L (ref 135–145)

## 2024-12-18 LAB — POCT I-STAT 7, (LYTES, BLD GAS, ICA,H+H)
Acid-base deficit: 3 mmol/L — ABNORMAL HIGH (ref 0.0–2.0)
Bicarbonate: 21.5 mmol/L (ref 20.0–28.0)
Calcium, Ion: 1.32 mmol/L (ref 1.15–1.40)
HCT: 25 % — ABNORMAL LOW (ref 39.0–52.0)
Hemoglobin: 8.5 g/dL — ABNORMAL LOW (ref 13.0–17.0)
O2 Saturation: 99 %
Patient temperature: 37.2
Potassium: 3.8 mmol/L (ref 3.5–5.1)
Sodium: 145 mmol/L (ref 135–145)
TCO2: 23 mmol/L (ref 22–32)
pCO2 arterial: 34.5 mmHg (ref 32–48)
pH, Arterial: 7.403 (ref 7.35–7.45)
pO2, Arterial: 134 mmHg — ABNORMAL HIGH (ref 83–108)

## 2024-12-18 LAB — GLUCOSE, CAPILLARY
Glucose-Capillary: 114 mg/dL — ABNORMAL HIGH (ref 70–99)
Glucose-Capillary: 110 mg/dL — ABNORMAL HIGH (ref 70–99)
Glucose-Capillary: 109 mg/dL — ABNORMAL HIGH (ref 70–99)

## 2024-12-18 LAB — CULTURE, BAL-QUANTITATIVE W GRAM STAIN: Gram Stain: NONE SEEN

## 2024-12-18 LAB — MAGNESIUM: Magnesium: 2.3 mg/dL (ref 1.7–2.4)

## 2024-12-18 LAB — COOXEMETRY PANEL
Carboxyhemoglobin: 1.5 % (ref 0.5–1.5)
Methemoglobin: <0.7 % (ref 0.0–1.5)
O2 Saturation: 90.1 %
Total hemoglobin: 7.9 g/dL — ABNORMAL LOW (ref 12.0–16.0)

## 2024-12-18 LAB — HEPARIN LEVEL (UNFRACTIONATED): Heparin Unfractionated: <0.1 [IU]/mL — ABNORMAL LOW (ref 0.30–0.70)

## 2024-12-18 SURGERY — RIGHT HEART CATH
Anesthesia: LOCAL

## 2024-12-18 MED ORDER — FUROSEMIDE 10 MG/ML IJ SOLN
40.0000 mg | Freq: Once | INTRAMUSCULAR | Status: AC
  Administered 2024-12-18: 40 mg via INTRAVENOUS
  Filled 2024-12-18: qty 4

## 2024-12-18 MED ORDER — HEPARIN (PORCINE) 25000 UT/250ML-% IV SOLN: 800.0000 [IU]/h | INTRAVENOUS | Status: DC

## 2024-12-18 MED ORDER — HEPARIN (PORCINE) 25000 UT/250ML-% IV SOLN
2100.0000 [IU]/h | INTRAVENOUS | Status: DC
  Administered 2024-12-18: 800 [IU]/h via INTRAVENOUS
  Administered 2024-12-19: 1100 [IU]/h via INTRAVENOUS
  Administered 2024-12-20: 1350 [IU]/h via INTRAVENOUS
  Administered 2024-12-21: 1500 [IU]/h via INTRAVENOUS
  Administered 2024-12-22: 1700 [IU]/h via INTRAVENOUS
  Administered 2024-12-22: 1850 [IU]/h via INTRAVENOUS
  Administered 2024-12-23: 2000 [IU]/h via INTRAVENOUS
  Administered 2024-12-23: 1950 [IU]/h via INTRAVENOUS
  Administered 2024-12-24 – 2024-12-25 (×2): 2000 [IU]/h via INTRAVENOUS
  Administered 2024-12-25 – 2024-12-26 (×2): 2100 [IU]/h via INTRAVENOUS
  Filled 2024-12-18 (×3): qty 250

## 2024-12-18 MED ORDER — TRACE MINERALS CU-MN-SE-ZN 300-55-60-3000 MCG/ML IV SOLN
INTRAVENOUS | Status: AC
  Filled 2024-12-18: qty 888

## 2024-12-18 NOTE — Progress Notes (Signed)
 ANTICOAGULATION CONSULT NOTE  Pharmacy Consult for heparin  Indication: bA/MVR  Allergies[1]  Patient Measurements: Height: 5' 10 (177.8 cm) Weight: 75.3 kg (166 lb 0.1 oz) IBW/kg (Calculated) : 73 Heparin  Dosing Weight: 70 kg   Vital Signs: Temp: 98 F (36.7 C) (12/26 2000) Temp Source: Oral (12/26 2000) BP: 130/81 (12/26 2100) Pulse Rate: 96 (12/26 2100)  Labs: Recent Labs    12/16/24 0517 12/16/24 0529 12/16/24 1156 12/17/24 0452 12/18/24 0601 12/18/24 0824 12/18/24 2122  HGB 8.5*   < > 8.8* 8.1* 8.5*  --   --   HCT 25.6*   < > 26.0* 24.8* 25.0*  --   --   PLT 352  --   --  375  --   --   --   HEPARINUNFRC  --   --   --   --   --   --  <0.10*  CREATININE 0.59*  --   --  0.47*  --  0.46*  --    < > = values in this interval not displayed.    Estimated Creatinine Clearance: 120.4 mL/min (A) (by C-G formula based on SCr of 0.46 mg/dL (L)).  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  Postop course complicated by ischemic bowel s/p exlap 12/11 with partial small bowel resection, abdomen left open.  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   Per Dr. Lucas, plan for 3 months of warfarin therapy with dual valve replacement. Not on anticoagulation prior to admission.  Pharmacy consulted for heparin  dosing.  Impella 5.5 removed 12/24, heparin  held. Ok to resume 12/26 with low therapeutic goal and slow titrations. **of note pt required ~1200 units/hr for heparin  level ~0.2**  Heparin  level undetectable on infusion at 800 units/hr. No issues with line or bleeding reported per RN.  Goal of Therapy:  Heparin  level 0.2-0.3 units/ml Monitor platelets by anticoagulation protocol: Yes   Plan:  Increase heparin  to 950 units/h no bolus Check heparin  level in 8h, will titrate slowly back to goal  Vito Ralph, PharmD,  BCPS Please see amion for complete clinical pharmacist phone list 12/18/2024            [1] No Known Allergies

## 2024-12-18 NOTE — Plan of Care (Signed)
 Problem: Education: Goal: Knowledge of General Education information will improve Description: Including pain rating scale, medication(s)/side effects and non-pharmacologic comfort measures Outcome: Progressing   Problem: Health Behavior/Discharge Planning: Goal: Ability to manage health-related needs will improve Outcome: Progressing   Problem: Clinical Measurements: Goal: Ability to maintain clinical measurements within normal limits will improve Outcome: Progressing Goal: Will remain free from infection Outcome: Progressing Goal: Diagnostic test results will improve Outcome: Progressing Goal: Respiratory complications will improve Outcome: Progressing Goal: Cardiovascular complication will be avoided Outcome: Progressing   Problem: Activity: Goal: Risk for activity intolerance will decrease Outcome: Progressing   Problem: Nutrition: Goal: Adequate nutrition will be maintained Outcome: Progressing   Problem: Coping: Goal: Level of anxiety will decrease Outcome: Progressing   Problem: Elimination: Goal: Will not experience complications related to bowel motility Outcome: Progressing Goal: Will not experience complications related to urinary retention Outcome: Progressing   Problem: Pain Managment: Goal: General experience of comfort will improve and/or be controlled Outcome: Progressing   Problem: Safety: Goal: Ability to remain free from injury will improve Outcome: Progressing   Problem: Skin Integrity: Goal: Risk for impaired skin integrity will decrease Outcome: Progressing   Problem: Education: Goal: Ability to demonstrate management of disease process will improve Outcome: Progressing Goal: Ability to verbalize understanding of medication therapies will improve Outcome: Progressing Goal: Individualized Educational Video(s) Outcome: Progressing   Problem: Activity: Goal: Capacity to carry out activities will improve Outcome: Progressing    Problem: Cardiac: Goal: Ability to achieve and maintain adequate cardiopulmonary perfusion will improve Outcome: Progressing   Problem: Education: Goal: Understanding of CV disease, CV risk reduction, and recovery process will improve Outcome: Progressing Goal: Individualized Educational Video(s) Outcome: Progressing   Problem: Activity: Goal: Ability to return to baseline activity level will improve Outcome: Progressing   Problem: Cardiovascular: Goal: Ability to achieve and maintain adequate cardiovascular perfusion will improve Outcome: Progressing Goal: Vascular access site(s) Level 0-1 will be maintained Outcome: Progressing   Problem: Health Behavior/Discharge Planning: Goal: Ability to safely manage health-related needs after discharge will improve Outcome: Progressing   Problem: Education: Goal: Understanding of CV disease, CV risk reduction, and recovery process will improve Outcome: Progressing Goal: Individualized Educational Video(s) Outcome: Progressing   Problem: Activity: Goal: Ability to return to baseline activity level will improve Outcome: Progressing   Problem: Cardiovascular: Goal: Ability to achieve and maintain adequate cardiovascular perfusion will improve Outcome: Progressing Goal: Vascular access site(s) Level 0-1 will be maintained Outcome: Progressing   Problem: Health Behavior/Discharge Planning: Goal: Ability to safely manage health-related needs after discharge will improve Outcome: Progressing   Problem: Education: Goal: Will demonstrate proper wound care and an understanding of methods to prevent future damage Outcome: Progressing Goal: Knowledge of disease or condition will improve Outcome: Progressing Goal: Knowledge of the prescribed therapeutic regimen will improve Outcome: Progressing Goal: Individualized Educational Video(s) Outcome: Progressing   Problem: Activity: Goal: Risk for activity intolerance will  decrease Outcome: Progressing   Problem: Cardiac: Goal: Will achieve and/or maintain hemodynamic stability Outcome: Progressing   Problem: Clinical Measurements: Goal: Postoperative complications will be avoided or minimized Outcome: Progressing   Problem: Respiratory: Goal: Respiratory status will improve Outcome: Progressing   Problem: Skin Integrity: Goal: Wound healing without signs and symptoms of infection Outcome: Progressing Goal: Risk for impaired skin integrity will decrease Outcome: Progressing   Problem: Urinary Elimination: Goal: Ability to achieve and maintain adequate renal perfusion and functioning will improve Outcome: Progressing   Problem: Cardiac: Goal: Ability to achieve and maintain adequate cardiopulmonary perfusion  will improve Outcome: Progressing Goal: Vascular access site(s) Level 0-1 will be maintained Outcome: Progressing   Problem: Fluid Volume: Goal: Ability to achieve a balanced intake and output will improve Outcome: Progressing

## 2024-12-18 NOTE — Progress Notes (Addendum)
 Patient ID: Alejandro Lopez, male   DOB: [birth year], 43 y.o.   MRN: [id number]     Advanced Heart Failure Rounding Note  Cardiologist: None  Chief Complaint: Valvular Heart Disease & HFrEF Patient Profile  43 y.o. male w/ h/o heavy alcohol use (at least 5 drinks per evening) and h/o asthma admitted w/ acute systolic heart failure. Strong family history of cardiac disease: mother and maternal grandmother had heart failure and father with CAD. Admitted with acute HFrEF/cardiogenic shock. Echo w/ severe AR, MR and LV dysfunction.    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  12/9 VT/VF ? vagal episode. Extubated 12/11: S/p R thoracentesis with resultant hemothorax.  Chest tube placed. S/p R intercostal artery coil (IR). S/p exp lap for ischemic bowel resection, bowel left in discontinuity 12/15: Back to OR for ischemic R colon and mesenteric ischemia. S/p exp/lap, wound vac exchange, R colectomy without anastomosis.  12/16 Back to OR for small bowel resection and ileostomy  12/18 OR for VATS/hemothorax washout, pericardial window was not done.  12/19 extubated 12/21 Reintubated. Ileus 12/23 S/p Trach. Fever post procedure, Tmax 101. Abx switched to cefepime    12/24 Impella removed 12/25: DBA stopped. Diuretics held d/t low CVPs  Subjective:    Off DBA. Co-ox resulted at 90, repeat pending.   Auto diuresing, 2.5L in UOP yesterday. CVP 11 today. BMP pending.   Pt pulled NGT out overnight.   Awake and following commands. No distress.     Objective:   Weight Range: 75.3 kg Body mass index is 23.82 kg/m.   Vital Signs:   Temp:  [98.4 F (36.9 C)-100.9 F (38.3 C)] 98.8 F (37.1 C) (12/26 0544) Pulse Rate:  [87-215] 96 (12/26 0801) Resp:  [14-31] 24 (12/26 0544) BP: (106-161)/(61-115) 141/91 (12/26 0544) SpO2:  [96 %-100 %] 100 % (12/26 0544) Arterial Line BP: (110-179)/(66-82) 110/82 (12/25 1800) FiO2 (%):  [40 %] 40 % (12/26  0801) Weight:  [75.3 kg] 75.3 kg (12/26 0500) Last BM Date : 12/17/24  Weight change: Filed Weights   12/16/24 0500 12/17/24 0600 12/18/24 0500  Weight: 79.3 kg 77.8 kg 75.3 kg    Intake/Output:   Intake/Output Summary (Last 24 hours) at 12/18/2024 0815 Last data filed at 12/18/2024 0609 Gross per 24 hour  Intake 1684.32 ml  Output 3235 ml  Net -1550.68 ml     Physical Exam   GENERAL: fatigued appearing, NAD Lungs- clear  CARDIAC:  JVP 10 cm          Mildly tachy rate regular rhythm. No MRG. No LEE  ABDOMEN: Soft, non-tender, non-distended. + ostomy  EXTREMITIES: Warm and well perfused.  NEUROLOGIC: No obvious FND  Telemetry   Sinus tach low 100s, personally reviewed   Labs    CBC Recent Labs    12/16/24 0517 12/16/24 0529 12/17/24 0452 12/18/24 0601  WBC 12.0*  --  12.0*  --   NEUTROABS 8.3*  --   --   --   HGB 8.5*   < > 8.1* 8.5*  HCT 25.6*   < > 24.8* 25.0*  MCV 90.5  --  90.8  --   PLT 352  --  375  --    < > = values in this interval not displayed.   Basic Metabolic Panel Recent Labs    87/75/74 0517 12/16/24 0529 12/17/24 0452 12/18/24 0435 12/18/24 0601  NA 143   < > 142  --  145  K 4.8   < > 4.2  --  3.8  CL 113*  --  113*  --   --   CO2 24  --  22  --   --   GLUCOSE 107*  --  114*  --   --   BUN 24*  --  21*  --   --   CREATININE 0.59*  --  0.47*  --   --   CALCIUM  8.9  --  9.0  --   --   MG 1.9  --  2.0 2.3  --   PHOS 4.3  --  3.1  --   --    < > = values in this interval not displayed.   Liver Function Tests Recent Labs    12/17/24 0452  AST 173*  ALT 248*  ALKPHOS 166*  BILITOT 1.6*  PROT 6.2*  ALBUMIN  2.2*   No results for input(s): LIPASE, AMYLASE in the last 72 hours. Cardiac Enzymes No results for input(s): CKTOTAL, CKMB, CKMBINDEX, TROPONINI in the last 72 hours.  BNP: BNP (last 3 results) No results for input(s): BNP in the last 8760 hours.  ProBNP (last 3 results) Recent Labs     11/22/24 1958  PROBNP 3,354.0*     D-Dimer No results for input(s): DDIMER in the last 72 hours.  Hemoglobin A1C No results for input(s): HGBA1C in the last 72 hours. Fasting Lipid Panel Recent Labs    12/17/24 0452  TRIG 232*    Thyroid Function Tests No results for input(s): TSH, T4TOTAL, T3FREE, THYROIDAB in the last 72 hours.  Invalid input(s): FREET3  Other results:   Imaging    DG CHEST PORT 1 VIEW Result Date: 12/04/2024 CLINICAL DATA:  Ventilator dependence. EXAM: PORTABLE CHEST 1 VIEW COMPARISON:  12/03/2024 FINDINGS: Endotracheal tube tip is approximately 3.8 cm above the base of the carina. The NG tube passes into the stomach although the distal tip position is not included on the film. Right-sided Impella device is stable in position. A left IJ pulmonary artery catheter tip overlies expected location of the interlobar pulmonary artery. Right pleural drain again noted with persistent right pleural fluid. Basilar collapse/consolidation noted, IMPRESSION: 1. No substantial interval change. 2. Persistent right pleural effusion with right base collapse/consolidation. 3. Support apparatus as above. Electronically Signed   By: Camellia Candle M.D.   On: 12/04/2024 07:15   DG Abd 1 View Result Date: 12/04/2024 CLINICAL DATA:   Routine adult health maintenance Best images obtainable due to patient's condition EXAM: ABDOMEN - 1 VIEW COMPARISON:  12/04/2024 FINDINGS: NG tube tip is in the distal stomach. Diffuse gaseous distention of colon evident, similar to prior. IMPRESSION: NG tube tip is in the distal stomach. Electronically Signed   By: Camellia Candle M.D.   On: 12/04/2024 07:11   CT Angio Chest/Abd/Pel for Dissection W and/or W/WO Addendum Date: 12/03/2024 ADDENDUM #1 ADDENDUM: ---------------------------------------------------- These results were called to Dr. Curtez Brallier  at 10:15 pm on 12/03/2024. Electronically signed by: Franky Crease MD 12/03/2024 10:23 PM EST  RP Workstation: [id number]   Result Date: 12/03/2024 ORIGINAL REPORT EXAM: CT CHEST, ABDOMEN AND PELVIS WITH AND WITHOUT CONTRAST 12/03/2024 09:52:07 PM TECHNIQUE: CT of the chest, abdomen and pelvis was performed with and without the administration of intravenous contrast. Multiplanar reformatted images are provided for review. Automated exposure control, iterative reconstruction, and/or weight based adjustment of the mA/kV was utilized to reduce the radiation dose to as low as reasonably achievable. COMPARISON: None available. CLINICAL  HISTORY: Acute aortic syndrome (AAS) suspected; Evaluate for intercostal bleeding or other sources after thoracentesis; also evaluate bowel. FINDINGS: CHEST: MEDIASTINUM AND LYMPH NODES: Mild cardiomegaly. Impella device tip in the left ventricle. Swan-ganz catheter tip in the central right pulmonary artery. No evidence of aortic aneurysm or dissection. The central airways are clear. No mediastinal, hilar or axillary lymphadenopathy. LUNGS AND PLEURA: Right chest tube in place. Moderate to large right pleural effusion with compressive atelectasis in the right middle lobe and right lower lobe. There appears to be active extravasation of contrast posteriorly between the right 9th and 10th ribs and likely into the pleural space, possibly related to prior thoracentesis. The pleural fluid appears complex on the right, likely hemothorax. Small left pleural effusion with compressive atelectasis in the left lower lobe. Tiny right pneumothorax. Biapical subpleural blebs. No evidence of pulmonary embolus. ABDOMEN AND PELVIS: LIVER: Gas is seen branching peripherally in the liver compatible with portal venous gas. GALLBLADDER AND BILE DUCTS: Gallbladder is unremarkable. No biliary ductal dilatation. SPLEEN: No acute abnormality. PANCREAS: No acute abnormality. ADRENAL GLANDS: No acute abnormality. KIDNEYS, URETERS AND BLADDER: No stones in the kidneys or ureters. No hydronephrosis. No  perinephric or periureteral stranding. Urinary bladder is unremarkable. GI AND BOWEL: Stomach and small bowel are decompressed. Diffuse gaseous distention of the colon suggests ileus. Moderate stool burden in the rectosigmoid colon. Pneumatosis noted in small bowel loops in the lower pelvis with air in the mesenteric veins feeding these small bowel loops. Pneumatosis also noted in the right colon wall. REPRODUCTIVE ORGANS: No acute abnormality. PERITONEUM AND RETROPERITONEUM: No ascites. No free air. VASCULATURE: Aorta is normal in caliber. ABDOMINAL AND PELVIS LYMPH NODES: No lymphadenopathy. BONES AND SOFT TISSUES: No acute osseous abnormality. No focal soft tissue abnormality. IMPRESSION: 1. Active contrast extravasation between the right 9th and 10th ribs with likely extension into the pleural space, suspicious for ongoing intercostal bleeding likely related to recent thoracentesis. 2. Moderate to large right pleural effusion compatible with hemothorax with compressive atelectasis in the right middle and lower lobes; right chest tube present; tiny right pneumothorax. 3. No evidence of aortic aneurysm, aortic dissection, or pulmonary embolus. 4. Pneumatosis involving small bowel loops in the lower pelvis and the right colon with mesenteric venous gas and portal venous gas, concerning for bowel ischemia. Recommend surgical consultation. 5. Small left pleural effusion with compressive atelectasis in the left lower lobe. 6. . Attempts are being made to contact the ordering physician with the results. An addendum will be made at that time. Electronically signed by: Franky Crease MD 12/03/2024 10:09 PM EST RP Workstation: [id number]   DG Chest Port 1 View Result Date: 12/03/2024 EXAM: 1 VIEW(S) XRAY OF THE CHEST 12/03/2024 07:57:00 PM COMPARISON: Portable chest today at 7:02 pm. CLINICAL HISTORY: Encounter for chest tube placement. FINDINGS: LINES, TUBES AND DEVICES: A pigtail chest tube has been inserted on the  right through the 6th intercostal space, with the pigtail in the lateral upper right thorax. Left IJ Swan-Ganz line again terminates in the distal right pulmonary artery. Stable positioning of right subclavian Impella device. LUNGS AND PLEURA: No pneumothorax. Moderate to large right pleural effusion is still present but decreased in the interval. Small left pleural effusion unchanged. The right mid to lower lung is obscured by pleural fluid. There is patchy consolidation in the left lower lung field. Left mid and both apical lungs are clear. HEART AND MEDIASTINUM: Sternotomy sutures and two cardiac valve replacements are again shown. There is mild cardiomegaly and  mild central vascular prominence as before. BONES AND SOFT TISSUES: No acute osseous abnormality. IMPRESSION: 1. Right pigtail chest tube in position with no measurable pneumothorax. 2. Moderate to large right pleural effusion, decreased compared to earlier today. 3. Patchy consolidation in the left lower lung field. 4. Small left pleural effusion, unchanged. 5. Cardiomegaly . Electronically signed by: Francis Quam MD 12/03/2024 08:10 PM EST RP Workstation: [id number]   DG CHEST PORT 1 VIEW Result Date: 12/03/2024 EXAM: 1 VIEW(S) XRAY OF THE CHEST 12/03/2024 07:05:00 PM COMPARISON: None available. CLINICAL HISTORY: ABLA (acute blood loss anemia). FINDINGS: LINES, TUBES AND DEVICES: Impella device and Swan-Ganz catheter remain in place, unchanged. LUNGS AND PLEURA: Enlarging right pleural effusion, now large with atelectasis throughout the right lung. Vascular congestion and left basilar atelectasis. Low lung volumes. No pneumothorax. HEART AND MEDIASTINUM: No acute abnormality of the cardiac and mediastinal silhouettes. BONES AND SOFT TISSUES: No acute osseous abnormality. IMPRESSION: 1. Enlarging right pleural effusion, now large, with associated right lung atelectasis. 2. Vascular congestion with left basilar atelectasis. 3. Low lung volumes.  Electronically signed by: Franky Crease MD 12/03/2024 07:10 PM EST RP Workstation: [id number]   DG Chest Port 1 View Result Date: 12/03/2024 CLINICAL DATA:  Pleural effusion, status post thoracentesis EXAM: PORTABLE CHEST 1 VIEW COMPARISON:  12/03/2024 FINDINGS: Single frontal view of the chest demonstrates stable left internal jugular flow directed central venous catheter, tip overlying the right infrahilar region. Stable mitral and aortic valve prostheses. Cardiac silhouette remains enlarged. Lung volumes are diminished, with bibasilar veiling opacities, right greater than left, consistent with consolidation and effusions. No appreciable change since prior study. No evidence of pneumothorax. IMPRESSION: 1. Persistent bibasilar consolidation and effusions, right greater than left. 2. No evidence of pneumothorax. 3. Stable enlarged cardiac silhouette. Electronically Signed   By: Ozell Daring M.D.   On: 12/03/2024 16:10    Medications:   Scheduled Medications:  arformoterol   15 mcg Nebulization BID   Chlorhexidine  Gluconate Cloth  6 each Topical Daily   colchicine   0.6 mg Per Tube Daily   famotidine   20 mg Per Tube BID   feeding supplement (PROSource TF20)  60 mL Per Tube Daily   folic acid   1 mg Per Tube Daily   Gerhardt's butt cream   Topical TID   lidocaine   1 patch Transdermal Q24H   mouth rinse  15 mL Mouth Rinse Q2H   pantoprazole  (PROTONIX ) IV  40 mg Intravenous Daily   PHENObarbital   32.5 mg Intravenous Q8H   QUEtiapine   25 mg Per Tube QHS   revefenacin   175 mcg Nebulization Daily   sodium chloride  flush  3 mL Intravenous Q12H   thiamine   100 mg Per Tube Daily    Infusions:  sodium chloride  20 mL/hr at 12/17/24 1412   amiodarone  30 mg/hr (12/18/24 0200)   ceFEPime  (MAXIPIME ) IV 2 g (12/18/24 0609)   feeding supplement (VITAL 1.5 CAL) Stopped (12/17/24 1900)   micafungin  (MYCAMINE ) 100 mg in sodium chloride  0.9 % 100 mL IVPB Stopped (12/17/24 1832)   TPN ADULT (ION) 75 mL/hr at  12/18/24 0200    PRN Medications: sodium chloride , acetaminophen  (TYLENOL ) oral liquid 160 mg/5 mL, albuterol , fentaNYL  (SUBLIMAZE ) injection, hydrALAZINE , LORazepam , midazolam  PF, morphine  injection, ondansetron  (ZOFRAN ) IV, mouth rinse, oxyCODONE , polyethylene glycol, sodium chloride , sodium chloride  flush  Assessment/Plan   1.  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  Valvular Disease  - severe MR posteriorly directed, mod TR. Likely functional.  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 Acute hypoxic/hypercarbic respiratory failure - reintubated 12/21 due to hypercarbia  - suspect mostly muscle weakness and ab distension +/- PNA - s/p Trach 12/23  3. Acute Systolic Heart Failure w/ Biventricular Dysfunction >> caridogenic shock - HS trop not c/w ACS but EKG w/ anteroseptal Qs  - CMRI Severe AR/MR. LVEF 35%  - Cath- normal cors, RA5, PA 32/17 (24), PVR 2.57 Papi 3. CO 4.9 CI 3.8 - Post-op Echo 12/02/24: EF < 20% with severe LVH (new, ?inflammatory), moderately decreased RV function, stable bioprosthetic MV and AoV.  - Impella removed 12/24  - off DBA. Hemodynamics ok  - Volume status trending up, getting TPN. Give 40 mg IV Lasix   - not taking POs. Will start PO GDMT slowly once cor-track replaced - can use IV hydral PRN for SBP > 150   4. Ischemic bowel - S/p exp/lap 12/11. Patchy ischemia of the distal ileum extending over about 75 cm without perforation. Ischemic segment resected.  Colon was distended with gas but viable. Small bowel left in discontinuity.  - Back to OR 12/15 for ischemic R colon and mesenteric ischemia. S/p exp/lap, wound vac exchange, R colectomy without anastomosis.  - Returned to OR 12/16 for ileostomy creation.  - GSU following. Ileostomy working well - Remains on TPN   5. R hemothorax - Hemothorax s/p right thoracentesis with intercostal artery injury. Massive bleeding requiring  multiple products and development of hemorrhagic shock, now improved. S/p IR embolization.  - S/p VATS 12/18 with right chest washout.  - H/H stable  - Chest tube in place  6.  ETOH Abuse - heavy drinker, at least 5 drinks per evening - may be contributing to CM, reduction of intake imperative  - No evidence of withdrawal   7 . DMII  - Hgb A1C 6.1 - On SSI.  8. ID - T max 101 F overnight - BAL w/ enterobacter cloacae and candida albicans. Zosyn  >> meropenem  12/21, vanc off and micafungin  started 12/22 - Cefepime  added 12/23.  9. AKI - AKI with shock.  - Resolved   10. VT - Quiescent - On amiodarone  gtt. Switch to po when able   11. Pericardial effusion - There is a moderate effusion without tamponade, noted again by echo 12/16.  Will need to follow over time.  - Limited echo 12/17 with moderate effusion.  - This was not drained at time of VATS/right hemothorax washout on 12/18. Appeared fibrinous.  12 Atrial flutter - In and out of AFL with controlled rate. Currently NSR - Remains in NSR Continue amiodarone  gtt.  - Heparin  off for Impella removal. Restart once ok w/ CT surgery    CRITICAL CARE Performed by: Caffie Shed   Total critical care time: 15 minutes  Critical care time was exclusive of separately billable procedures and treating other patients.  Critical care was necessary to treat or prevent imminent or life-threatening deterioration.  Critical care was time spent personally by me on the following activities: development of treatment plan with patient and/or surrogate as well as nursing, discussions with consultants, evaluation of patient's response to treatment, examination of patient, obtaining history from patient or surrogate, ordering and performing treatments and interventions, ordering and review of laboratory studies, ordering and review of radiographic studies, pulse oximetry and re-evaluation of patient's condition.   Caffie Shed,  PA-C 12/18/2024  Agree with above.   Off DBA. On trach trial today. C/o SOB.  CVP 11  Co-ox 90% (?)  General:  Sitting up in bed. Tachypneic/anxious on trach collar HEENT: normal Neck: supple JVP 10 + trach Cor: Regular tachy  Lungs: clear Abdomen: soft, nontender, nondistended.Good bowel sounds. + ostomy ok Extremities: no cyanosis, clubbing, rash, edema Neuro: alert & orientedx3, cranial nerves grossly intact. moves all 4 extremities w/o difficulty. Affect pleasant  Struggling with trach trial but stable from HF perspective. Will give IV lasix  x 1 today   Start GDMT when gets Cor-trak in   Toribio Fuel, MD  9:42 AM

## 2024-12-18 NOTE — Progress Notes (Signed)
 PHARMACY - TOTAL PARENTERAL NUTRITION CONSULT NOTE   Indication: massive bowel resection   Patient Measurements: Height: 5' 10 (177.8 cm) Weight: 75.3 kg (166 lb 0.1 oz) IBW/kg (Calculated) : 73 TPN AdjBW (KG): 73.4 Body mass index is 23.82 kg/m. Usual Weight: weight 79kg on admission, unclear baseline.   Assessment:  43 yo M s/p aortic and mitral valve replacement with Impella on 12/8. Post-op complicated by persistent HgB drop with abdominal pain, found to have hemorrhage from R intercostal artery s/p chest tube and pneumatosis and concern for bowel ischemia. Patient went to OR on 12/12 for ex-lap found to have ischemia of distal ileum over 75cm without perforation. Patient was left in discontinuity and continued to require 3 vasopressors post-op, concerning for ongoing ischemia. Patient with diet throughout admission however noted nausea and abdominal pain during peri-op time period. On 12/21, tube feeds attempte but patient with vomiting so held again. Patient's EF is ~25%. Pharmacy consulted to manage TPN.   Impella removed 12/25.    Glucose / Insulin : no hx DM, A1c 6.1%, BG < 120, 0 insulin  used  Electrolytes: Na 142 (none in TPN), Cl 113, CoCa 10.44, others wnl Renal: SCr 0.47 (BL SCr ~ 0.7), BUN wnl 12/15 Lasix  60 mg IV x 2  12/16-12/19 Lasix  80 mg IV x BID 12/20 Lasix  80 mg IV x1 Hepatic: alk phos 166 up, AST/ALT 173/248 stable, tbili 1.6 down, albumin  2.2, TG 232 Intake / Output; MIVF: UOP 1.4 ml/kg/hr, NG 0 mL, drains 0ml, chest tube 50 mL, stool 900 mL, Net IO Since Admission: -15,474.92 mL [12/18/24 0705]  GI Imaging: 12/11 CT: pneumatosis of small bowel with gas, concerning for bowel ischemia  12/12 KUB: Diffuse gaseous distention of colon evident, similar to prior. 12/17 CT: Status post right colectomy with right periumbilical ileostomy, Radiopaque material layering within the gallbladder, Moderate soft tissue edema of the abdomen and pelvis.  12/22 KUB: Nonobstructive  bowel gas pattern. 12/25 KUB: mild SB Dilation, Nonobstructive bowel gas pattern GI Surgeries / Procedures:  12/12 ex-lap, distal ileum with necrosis without perforation, 75cm small bowel resection, left in discontinuity, VAC placed  12/14 ex lap, terminal ileum and ascending colon had patchy necrosis, right colectomy, abdomen left open, placement of wound vac,  12/16 dusky at end of small bowel, small bowel resection, end ileostomy, closure  12/19 Cortrak placed 12/21 Cortrak removed, NGT placed 12/25 NGT removed   Central access: PICC placed 12/04/24 TPN start date: 12/04/24  Nutritional Goals:  Goal concentrated TPN rate is 75 mL/hr, provides 133 g protein, 2257 kcals   RD Estimated Needs Total Energy Estimated Needs: 2200-2400kcals Total Protein Estimated Needs: 125-150 g Total Fluid Estimated Needs: 1.8 L  Current Nutrition:  TPN + NPO  12/20 CLD, TF 30 ml/hr- ostomy not working yet >> emesis > Hold TF  12/21 NGT with dark thick, bilious effluent 12/22 thin brown stool from ostomy  12/23 propofol  at 26 ml/hr, Remove lipids from TPN  12/24 TF @ 43ml/hr, no titration for at least 24hr 12/25 TF @ 78ml/hr>> stopped at 1900; propofol  off and plan to stay off  12/26 TF off   Plan:  Continue concentrated TPN at goal 75 ml/hr, provides 100% estimated needs  Electrolytes in TPN: Na 0 mEq/L, K 27 mEq/L, decrease Ca 2 mEq/L, Mg 12 mEq/L, Phos 8 mmol/L, max acetate  Add standard MVI and trace elements to TPN Stop BG checks  Monitor TPN labs on Mon/Thurs and PRN F/u tube feed tolerance and titration, wean  TPN as able   F/u Cortrak and TF restart, wean TPN as able   Thank you for allowing pharmacy to be a part of this patients care.  Jinnie Door, PharmD, BCPS, BCCP Clinical Pharmacist  Please check AMION for all [facility] Pharmacy phone numbers After 10:00 PM, call Main Pharmacy [phone]

## 2024-12-18 NOTE — Progress Notes (Signed)
 Pt refused bed CPT at this time. RT stressed importance of CPT to pt. Will attempt at a later time.

## 2024-12-18 NOTE — Progress Notes (Addendum)
 ANTICOAGULATION CONSULT NOTE  Pharmacy Consult for heparin  Indication: bA/MVR  Allergies[1]  Patient Measurements: Height: 5' 10 (177.8 cm) Weight: 75.3 kg (166 lb 0.1 oz) IBW/kg (Calculated) : 73 Heparin  Dosing Weight: 70 kg   Vital Signs: Temp: 98.8 F (37.1 C) (12/26 0544) Temp Source: Bladder (12/26 0000) BP: 141/91 (12/26 0544) Pulse Rate: 96 (12/26 0801)  Labs: Recent Labs    12/16/24 0517 12/16/24 0529 12/16/24 1156 12/17/24 0452 12/18/24 0601  HGB 8.5*   < > 8.8* 8.1* 8.5*  HCT 25.6*   < > 26.0* 24.8* 25.0*  PLT 352  --   --  375  --   CREATININE 0.59*  --   --  0.47*  --    < > = values in this interval not displayed.    Estimated Creatinine Clearance: 120.4 mL/min (A) (by C-G formula based on SCr of 0.47 mg/dL (L)).  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  Postop course complicated by ischemic bowel s/p exlap 12/11 with partial small bowel resection, abdomen left open.  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   Per Dr. Lucas, plan for 3 months of warfarin therapy with dual valve replacement. Not on anticoagulation prior to admission.  Pharmacy consulted for heparin  dosing.  Impella 5.5 removed 12/24, heparin  held. Ok to resume today with low therapeutic goal and slow titrations.  Goal of Therapy:  Heparin  level 0.2-0.3 units/ml Monitor platelets by anticoagulation protocol: Yes   Plan:  Resume heparin  at 800 units/h no bolus Check heparin  level in 8h, will titrate slowly back to goal  Ozell Jamaica, PharmD, BCPS, [facility] Clinical Pharmacist [phone] Please check AMION for all [facility] Pharmacy numbers 12/18/2024           [1] No Known Allergies

## 2024-12-18 NOTE — Progress Notes (Signed)
 TCTS Evening Rounds:  Hemodynamically stable in NSR 90's on IV amio.  Weaned on PSV for a couple hours today but tired out with tachypnea.  Cortrak tube re-inserted and now back on trickle tube feeds in addition to TNA.  Worked with PT today.

## 2024-12-18 NOTE — Progress Notes (Signed)
 "  NAME:  Alejandro Lopez, MRN:  [id number], DOB:  Jul 07, 1981, LOS: 25 ADMISSION DATE:  11/22/2024, CONSULTATION DATE:  12/8 REFERRING MD:  Lucas, CHIEF COMPLAINT:  post cardiac surgery critical care services    History of Present Illness:  43 year old male with history of hypertension, alcohol and tobacco abuse, presented to the emergency room on 11/30 with approximately 1 month history of progressive dyspnea accompanied by lower extremity swelling extending up to the level of his scrotum and abdomen. Diagnostic evaluation by echocardiogram showed left ventricular ejection fraction 35 to 40% with grade 3 diastolic dysfunction this was further complicated by severe mitral valve regurgitation and aortic insufficiency because of this he was transferred to [facility] for further evaluation. He underwent cardiac catheterization on 12/4: Right heart hemodynamic parameters showed baseline right atrial pressure 5 mmHg, PA pressure 32/17, pulmonary capillary wedge pressure at 14 estimated Fick 4.9 L/min with cardiac index Fick calculated at 2.59 His PAPi was 3 Left heart cath was negative for coronary artery disease Went to OR 12/8 for MVR and AVR w/ impella insertion. PCCM asked to assist w/ post op care   OR course EBL: 1735 Received  Products: cryo 92ml, FFP 401 Also received DDAVP , 2200 crystalloid, 250ml albumin   Cell saver 1125 Pump time 4hrs Clamp time 2hrs 50 min  Events: multiple defibrillations intra-op  Intra-op ECHO EF estimated 40%  Pertinent  Medical History  Tobacco abuse, alcohol abuse, hypertension  Significant Hospital Events: Including procedures, antibiotic start and stop dates in addition to other pertinent events   11/30 admitted/. ECHO 35 to 40% with grade 3 diastolic dysfunction this was further complicated by severe mitral valve regurgitation and aortic insufficiency 12/4 left and right heart cath 12/8 AVR and MVR w/ bioprosthetic valves. Arrived on icu w/  Impella 5.5 MCS at flow 4 lpm and P 7. Received 2 more PLTs, 2 FFP and 1 cryo in first 6 hrs post op for cont blood loss/oozing. Required NE, epi and milrinone . Good flow on IMPELLA but minimal pulsatility  at P7. Placed on Amio gtt for VT 12/9 received 1 unit PRBC over night for hgb down to 7.5, hgb 8 getting second unit of blood. Still on P7 3.5 lPM. Extubated. 12/11 1L thora, later hemorrhagic shock and hemothorax> IR embolization. Also found to have ischemic bowel and underwent ex-lap with partial resection of ileum. 12/15 plan for ostomy tomorrow, transition epi to levophed   12/16 plan back to OR for ostomy placement  12/17 hemodynamically stable off pressors on Milrinone  0.25 , unable to tolerate SBT, plan for CT chest and heparin  gtt, high fevers>micafungin  12/18 VATS for decortication R chest 12/19 extubated 12/20-12/21 abx to meropenem , aspiration/ agitation/ weakness leading to CO2 retention, RV dysfunction and reintubation, L CVC removed; R CVC placed 12/22: Failed SBT d/t wob 12/23: Bedside tracheostomy  12/24 Impella 5 5 was removed in OR, spiked fever with Tmax 101.1, Precedex  stopped, remained off pressors on dobutamine  at 2.5 12/25 tolerated 1 hr weaning on vent, off DBA  Interim History / Subjective:  Yesterday and overnight has d/c 2 NGT. Wants to go home.  Tmax 100.9, afebrile overnight.  Remains off DBA.  Objective    Blood pressure (!) 141/91, pulse 93, temperature 98.8 F (37.1 C), resp. rate (!) 24, height 5' 10 (1.778 m), weight 75.3 kg, SpO2 100%. CVP:  [0 mmHg-15 mmHg] 10 mmHg  Vent Mode: PRVC FiO2 (%):  [40 %] 40 % Set Rate:  [20 bmp] 20 bmp Vt  Set:  [580 mL] 580 mL PEEP:  [5 cmH20] 5 cmH20 Pressure Support:  [5 cmH20-8 cmH20] 8 cmH20 Plateau Pressure:  [15 cmH20-26 cmH20] 20 cmH20   Intake/Output Summary (Last 24 hours) at 12/18/2024 9362 Last data filed at 12/18/2024 0200 Gross per 24 hour  Intake 2181.91 ml  Output 3600 ml  Net -1418.09 ml   Filed  Weights   12/16/24 0500 12/17/24 0600 12/18/24 0500  Weight: 79.3 kg 77.8 kg 75.3 kg       Physical exam: General: chronically ill appearing man lying in bed in NAD HEENT: [place]/AT, eyes anicteric Neck: trach with sutures in place Neuro: Awake, alert, trying to talk. Moving all extremities.  Chest: breathing comfortably on MV, rhonchi improved with suctioning Heart: S1S2, RRR Abdomen: Soft, NT. Midline incision dressed. Ostomy with stool output.   7.4/35/134/22 BMP pending 12/21 resp culture: few candida 12/23  BAL NGTD  CXR personally reviewed> RLL atelectasis, R chest tube. Tracheostomy tube.   Patient Lines/Drains/Airways Status     Active Line/Drains/Airways     Name Placement date Placement time Site Days   PICC Triple Lumen 12/04/24 Right Brachial 40 cm 0 cm 12/04/24  1623  -- 14   CVC Triple Lumen 12/13/24 Right Internal jugular 12/13/24  1600  -- 5   Chest Tube 1 Right Pleural 28 Fr. 12/10/24  1951  Pleural  8   Negative Pressure Wound Therapy Abdomen Medial 12/10/24  0730  --  8   Ileostomy Standard (end) RLQ 12/08/24  1352  RLQ  10   Urethral Catheter Jeronimo Cruz Reyes RN Temperature probe 12/03/24  2345  Temperature probe  15   Tracheostomy Shiley Flexible 6 mm Cuffed 12/15/24  1143  6 mm  3   Wound 11/30/24 1512 Surgical Closed Surgical Incision Chest Other (Comment) 11/30/24  1512  Chest  18   Wound 11/30/24 1512 Surgical Closed Surgical Incision Chest Right 11/30/24  1512  Chest  18   Wound 12/05/24 1000 Pressure Injury Buttocks Mid Deep Tissue Pressure Injury - Purple or maroon localized area of discolored intact skin or blood-filled blister due to damage of underlying soft tissue from pressure and/or shear. 12/05/24  1000  Buttocks  13   Wound 12/05/24 1033  Back Left;Upper 12/05/24  1033  Back  13   Wound 12/08/24 1408 Surgical Closed Surgical Incision Abdomen Other (Comment) 12/08/24  1408  Abdomen  10            Resolved problem list  AKI Lactic  acidosis, resolved Constipation Hyponatremia Assessment and Plan  Acute biventricular HFrEF with cardiogenic shock status post Impella 5.5, removed on 12/24 Severe mitral regurgitation and aortic insufficiency status post bioprosthetic mitral valve and aortic valve replacement Nonsustained VT, no more episodes Moderate circumferential pericardial effusion -con't to monitor off inotropes -will eventually need GDMT; not sure about GI absorption yet -start heparin  gtt; low goal for range 0.2-0.3 heparin  levels -amiodarone  -serial coox  Right-sided hemothorax status post IR embolization 12/11, status post VATS 12/18 -d/c chest tube today  Acute respiratory failure with hypoxia and hypercapnia status post trach -trach care per protocol; can d/c trach sutures on 12/30 -LTVV -VAP prevention protocol -wean vent; has only tolerated short periods of vent weaning -SLP consulted for PMV trials on vent  Sepsis with septic shock due to recurrent aspiration pneumonia -Enterobacter -completing cefepime  today, micafungin  12/29  Ischemic bowel s/p laparotomy with partial ileum resection end-ileostomy  Postop ileus, improving -cortrak today to con't TF -ok to  try swallow eval with SLP on vent; needs PMV trials on vent -appreciate surgery's management -may need bowel protocol for high output eventually; want to ensure no vomiting/ ileus issues first  Acute septic encephalopathy, improved -PT, OT, SLP -wife at bedside  Shock liver -maintain adequate perfusion  Hypertriglyceridemia due to propofol  infusion, now TPN -monitor; wean off TPN when able  H/o alcohol abuse -vitamins, recommend cessation  Anemia of critical illness and previous ABLA from hemothorax, operative blood loss -transfuse for Hb <7 or hemodynamically significant bleeding  Agitation> situational due to prolonged illness Debility -PT, OT, SLP -encouraged him to work with therapy to get stronger  Wife updated at  bedside.   DVT: heparin  gtt restarting today GI: pepcid  Lines: need to assess if he still needs TLC x 2. D/c chest tube & foley today.    sodium chloride  20 mL/hr at 12/17/24 1412   amiodarone  30 mg/hr (12/18/24 0200)   ceFEPime  (MAXIPIME ) IV 2 g (12/18/24 0609)   feeding supplement (VITAL 1.5 CAL) Stopped (12/17/24 1900)   micafungin  (MYCAMINE ) 100 mg in sodium chloride  0.9 % 100 mL IVPB Stopped (12/17/24 1832)   TPN ADULT (ION) 75 mL/hr at 12/18/24 0200   This patient is critically ill with multiple organ system failure which requires frequent high complexity decision making, assessment, support, evaluation, and titration of therapies. This was completed through the application of advanced monitoring technologies and extensive interpretation of multiple databases. During this encounter critical care time was devoted to patient care services described in this note for 45 minutes.  Leita SHAUNNA Gaskins, DO 12/18/2024 9:08 AM [facility]  For contact information, see Amion. If no response to pager, please call PCCM consult pager. After hours, 7PM- 7AM, please call Elink.  "

## 2024-12-18 NOTE — Progress Notes (Signed)
 Pt refusing bed CPT. RN at bedside to witness.

## 2024-12-18 NOTE — Procedures (Signed)
 Cortrak  Person Inserting Tube:  Mady Dolly, RD Tube Type:  Cortrak - 43 inches Tube Size:  10 Tube Location:  Left nare Secured by: Bridle Initial Placement:  Gastric Technique Used to Measure Tube Placement:  Marking at nare/corner of mouth Cortrak Secured At:  63 cm   Cortrak Tube Team Note:  Consult received to place a Cortrak feeding tube.   No x-ray is required. RN may begin using tube.   If the tube becomes dislodged please keep the tube and contact the Cortrak team at [email or web address] for replacement.  If after hours and replacement cannot be delayed, place a NG tube and confirm placement with an abdominal x-ray.    Dolly Mady MS, RD, LDN Registered Dietitian I Clinical Nutrition RD Inpatient Contact Info in Amion

## 2024-12-18 NOTE — Clinical Note (Incomplete)
 Alejandro Lopez

## 2024-12-18 NOTE — Evaluation (Signed)
 Physical Therapy ReEvaluation and Treatment Patient Details Name: Alejandro Lopez MRN: [id number] DOB: May 29, 1981 Today's Date: 12/18/2024  History of Present Illness  The pt is a 43 yo male presenting 11/30 with swelling of LE, groin, and abdomen. Work up revealed acute HFrEF, severe MR, aortic insufficiency, and transaminates. 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 Extubated 12/9. S/p thoracentesis 12/11 with 1000cc's of blood drained from R pleural space, rapid recollection and pt then s/p R chest tube placed 12/11 with 2.5L bloody output. S/p  coil embolization of right intercostal artery at level of right 10th rib, followed by ex lap with wound vac placement which showed patchy ischemia of ileum extending 75cm. Return to OR 12/14 with R colectomy and 12/16 for ostomy placement. To OR 12/18 for R VATS, washout, and pericardial window. Intubated 12/12-12/19, re-intubated 12/21. Impella removed 12/24   PMH includes: HTN, alcohol use (5 drinks/day), tobacco use, but is limited by limited access to healthcare.  Clinical Impression  Pt did very well with therapy today, eager to get out of bed. Pt limited by continued ventilation (tolerated 2 hrs off vent today) in presence of generalized weakness, decreased balance and endurance. Pt is able to come to sidelying with min A and come to seated EoB with modA. Once there pt able to participate in seated exercise. With assistance from RN pt able to stand with mod-minAx2 for taking lateral steps along side of bed. Pt expresses desire to walk in hallway. PT educated pt on need to pass weaning trial and keep Cortrak in place so that he get enough nutrients to have strength to be able to work with therapy. D/c plans remain appropriate at this time.  PT will continue to follow acutely.        If plan is discharge home, recommend the following: Two people to help with walking and/or transfers;Two people to help with  bathing/dressing/bathroom;Assistance with feeding;Assist for transportation;Help with stairs or ramp for entrance   Can travel by private vehicle    No    Equipment Recommendations  (defer until gait training completed)  Recommendations for Other Services  Rehab consult    Functional Status Assessment Patient has had a recent decline in their functional status and demonstrates the ability to make significant improvements in function in a reasonable and predictable amount of time.     Precautions / Restrictions Precautions Precautions: Fall;Sternal Recall of Precautions/Restrictions: Impaired Precaution/Restrictions Comments: ETT, NG tube, foley, R radial a-line Restrictions Weight Bearing Restrictions Per Provider Order: No RUE Weight Bearing Per Provider Order: Non weight bearing LUE Weight Bearing Per Provider Order: Non weight bearing Other Position/Activity Restrictions: sternal precautions      Mobility  Bed Mobility Overal bed mobility: Needs Assistance Bed Mobility: Rolling, Sidelying to Sit, Sit to Sidelying Rolling: Min assist Sidelying to sit: Mod assist, Max assist     Sit to sidelying: Mod assist General bed mobility comments: pt provided road mapping of plan to come to side of bed by coming through sidelying and then bringing trunk to upright, pt able to initiate roll to R needing minA to come all the way over on his R hip, pt provided modA for moving LE off bed and maxA for bringing trunk to upright, no complaints of dizziness with coming to seated EoB. mod A for bringing LE back into bed at end of session    Transfers Overall transfer level: Needs assistance Equipment used: 2 person hand held assist Transfers: Sit to/from  Stand, Bed to chair/wheelchair/BSC Sit to Stand: +2 physical assistance, Mod assist, Min assist          Lateral/Scoot Transfers: Min assist, +2 physical assistance General transfer comment: modAx2 for initial bout of standing,  min Ax2  for second bout good power up assistance needed for steadying, pt able to take lateral steps towards HoB, after 3 steps, ventilator tube came off trach. pt sat and tubing was replaced without incident, pt able to stand again and take an additional 3 steps towards the [facility] to bring hip against bed rail, pt able to sit with hips back in bed for easier return to supine.    Ambulation/Gait               General Gait Details: deferred due to lack of staff to walk on ventilator      Balance Overall balance assessment: Needs assistance Sitting-balance support: Feet supported, Bilateral upper extremity supported Sitting balance-Leahy Scale: Fair Sitting balance - Comments: pt requires some assist for achieving seated balance but then is able to maintain without outside assist.                                     Pertinent Vitals/Pain Pain Assessment Pain Assessment: Faces Faces Pain Scale: Hurts a little bit Pain Location: generalized with movement Pain Descriptors / Indicators: Grimacing, Guarding Pain Intervention(s): Limited activity within patient's tolerance, Monitored during session, Repositioned    Home Living Family/patient expects to be discharged to:: Private residence                   Additional Comments: reading glasses    Prior Function Prior Level of Function : Independent/Modified Independent;Driving;Working/employed             Mobility Comments: independent ADLs Comments: independent, full time forklift driver     Extremity/Trunk Assessment        Lower Extremity Assessment Lower Extremity Assessment: Generalized weakness    Cervical / Trunk Assessment Cervical / Trunk Assessment: Normal;Other exceptions Cervical / Trunk Exceptions: cervical rotation to R with ETT  Communication   Communication Communication: Impaired Factors Affecting Communication: Trach/intubated    Cognition Arousal: Alert Behavior During Therapy:  WFL for tasks assessed/performed   PT - Cognitive impairments: Difficult to assess Difficult to assess due to: Tracheostomy                     PT - Cognition Comments: requires increased time Following commands: Impaired Following commands impaired: Follows one step commands with increased time, Follows multi-step commands with increased time     Cueing Cueing Techniques: Verbal cues, Gestural cues, Visual cues     General Comments  VSS on ventilator FiO2 40%O2    Exercises General Exercises - Lower Extremity Long Arc Quad: AROM, Both, 5 reps, Seated Hip Flexion/Marching: AROM, Both, 5 reps, Seated   Assessment/Plan    PT Assessment Patient needs continued PT services  PT Problem List Decreased strength;Decreased activity tolerance;Decreased balance;Decreased mobility;Cardiopulmonary status limiting activity;Decreased skin integrity;Pain       PT Treatment Interventions DME instruction;Gait training;Functional mobility training;Therapeutic activities;Therapeutic exercise;Balance training;Cognitive remediation;Patient/family education    PT Goals (Current goals can be found in the Care Plan section)  Acute Rehab PT Goals Patient Stated Goal: to get extubated PT Goal Formulation: With patient Time For Goal Achievement: 12/28/24 Potential to Achieve Goals: Good    Frequency Min 2X/week  AM-PAC PT 6 Clicks Mobility  Outcome Measure Help needed turning from your back to your side while in a flat bed without using bedrails?: Total Help needed moving from lying on your back to sitting on the side of a flat bed without using bedrails?: Total Help needed moving to and from a bed to a chair (including a wheelchair)?: Total Help needed standing up from a chair using your arms (e.g., wheelchair or bedside chair)?: Total Help needed to walk in hospital room?: Total Help needed climbing 3-5 steps with a railing? : Total 6 Click Score: 6    End of Session  Equipment Utilized During Treatment: Oxygen  Activity Tolerance: Patient tolerated treatment well (s/p ativan ) Patient left: in bed;with call bell/phone within reach;with nursing/sitter in room Nurse Communication: Mobility status PT Visit Diagnosis: Unsteadiness on feet (R26.81);Muscle weakness (generalized) (M62.81)    Time: 8762-8697 PT Time Calculation (min) (ACUTE ONLY): 25 min   Charges:   PT Evaluation $PT Re-evaluation: 1 Re-eval PT Treatments $Therapeutic Activity: 8-22 mins PT General Charges $$ ACUTE PT VISIT: 1 Visit         Latish Toutant B. Fleeta Lapidus PT, DPT Acute Rehabilitation Services Please use secure chat or  Call Office [phone]   Almarie KATHEE Fleeta [facility] 12/18/2024, 1:39 PM

## 2024-12-18 NOTE — Consult Note (Addendum)
 WOC Nurse wound follow up Wound type: open surgical to mid-abd Measurement: 13 x 4 x 2.5 cm Wound bed: red, some adipose tissue, center of wound with valley Drainage serosanguinous drainage, no malodor Periwound: clean edges Dressing procedure/placement/frequency: VAC foam exchange Tues and Friday  Removed old NPWT of intact dressing and base of wound visualized  Cleansed wound with normal saline x 2 Barrier ring placed adjacent to ostomy to create a barrier. Placed additional drape over sides of wound to prevent leakage Filled wound with  _0__ piece of Mepitel, ____0 piece of white foam, __01__ piece of black foam  Sealed NPWT dressing at Hg neg pressure Patient received IV pain medication per bedside nurse prior to dressing change Patient tolerated procedure well and no leak alarms noted   WOC nurse will continue to provide NPWT dressing changed due to the complexity of the dressing change.   12/18/24 surgical site    WOC Nurse ostomy follow up Stoma type/location: Right lower abd, ileostomy  Appliance change was done Stomal assessment/size: 44 mm round, stoma is moist, red, viable, well-budded Peristomal assessment: intact, some moisture build up, suturing intact Treatment options for stomal/peristomal skin: placed 1-piece soft convex, barrier ring Output  75 mls liquid brownish effluent Ostomy pouching: 1pc Education provided: CG at bedside, reviewed how to place appliance and ring, basic care concepts; verbalized understanding.  Enrolled patient in [place] Secure Start Discharge program: No, not ready for discharge  Please reconsult if wound worsens in condition and notify provider. Coordinated care with primary nurse and providers.   If an alarm condition cannot be resolved after attempting resolutions listed in the troubleshooting guide, then contact [employer] at [phone]. Promptly notify the treating physician to obtain orders for their desired alternative dressing  if the negative-therapy unit is going to be turned off or has been malfunctioning for 2 hours or more.   Sherrilyn Hals MSN RN CWOCN WOC [facility]  [phone] (Available from 7-3 pm Mon-Friday)

## 2024-12-18 NOTE — Progress Notes (Addendum)
 "  Trauma/Critical Care Follow Up Note  Subjective:    Overnight Issues:  Pulled out his NGT Denies pain at present Having BMs per rectum and via ileostomy. Objective:  Vital signs for last 24 hours: Temp:  [98.2 F (36.8 C)-100.9 F (38.3 C)] 99 F (37.2 C) (12/26 0843) Pulse Rate:  [87-215] 108 (12/26 0843) Resp:  [8-39] 39 (12/26 0843) BP: (106-161)/(61-115) 161/99 (12/26 0843) SpO2:  [57 %-100 %] 99 % (12/26 0843) Arterial Line BP: (110-179)/(66-82) 110/82 (12/25 1800) FiO2 (%):  [40 %] 40 % (12/26 0801) Weight:  [75.3 kg] 75.3 kg (12/26 0500)  Intake/Output from previous day: 12/25 0701 - 12/26 0700 In: 2322.1 [I.V.:1799.6; NG/GT:117.5; IV Piggyback:405] Out: 3535 [Urine:2585; Stool:900; Chest Tube:50]  Intake/Output this shift: Total I/O In: 166.8 [I.V.:166.8] Out: 270 [Urine:250; Chest Tube:20]  Vent settings for last 24 hours: Vent Mode: PSV;CPAP FiO2 (%):  [40 %] 40 % Set Rate:  [20 bmp] 20 bmp Vt Set:  [580 mL] 580 mL PEEP:  [5 cmH20] 5 cmH20 Pressure Support:  [5 cmH20-8 cmH20] 5 cmH20 Plateau Pressure:  [15 cmH20-26 cmH20] 16 cmH20  Physical Exam:  Gen: comfortable, no distress Neuro: follows commands HEENT: PERRL Neck: supple CV: RRR Pulm: unlabored breathing on mechanical ventilation-full support Abd: soft, NT, midline vac in place , ostomy productive (900 mL, dark brown/black) and mucoid stool from rectum GU: urine clear and yellow, +spontaneous voids Extr: wwp, no edema  Results for orders placed or performed during the hospital encounter of 11/22/24 (from the past 24 hours)  Glucose, capillary     Status: Abnormal   Collection Time: 12/17/24 11:26 AM  Result Value Ref Range   Glucose-Capillary 116 (H) 70 - 99 mg/dL  Glucose, capillary     Status: Abnormal   Collection Time: 12/17/24  7:49 PM  Result Value Ref Range   Glucose-Capillary 119 (H) 70 - 99 mg/dL  Glucose, capillary     Status: Abnormal   Collection Time: 12/17/24 11:37 PM   Result Value Ref Range   Glucose-Capillary 113 (H) 70 - 99 mg/dL  Glucose, capillary     Status: Abnormal   Collection Time: 12/18/24  3:30 AM  Result Value Ref Range   Glucose-Capillary 114 (H) 70 - 99 mg/dL  Cooxemetry Panel (carboxy, met, total hgb, O2 sat)     Status: Abnormal   Collection Time: 12/18/24  4:35 AM  Result Value Ref Range   Total hemoglobin 7.9 (L) 12.0 - 16.0 g/dL   O2 Saturation 09.8 %   Carboxyhemoglobin 1.5 0.5 - 1.5 %   Methemoglobin <0.7 0.0 - 1.5 %  Magnesium      Status: None   Collection Time: 12/18/24  4:35 AM  Result Value Ref Range   Magnesium  2.3 1.7 - 2.4 mg/dL  I-STAT 7, (LYTES, BLD GAS, ICA, H+H)     Status: Abnormal   Collection Time: 12/18/24  6:01 AM  Result Value Ref Range   pH, Arterial 7.403 7.35 - 7.45   pCO2 arterial 34.5 32 - 48 mmHg   pO2, Arterial 134 (H) 83 - 108 mmHg   Bicarbonate 21.5 20.0 - 28.0 mmol/L   TCO2 23 22 - 32 mmol/L   O2 Saturation 99 %   Acid-base deficit 3.0 (H) 0.0 - 2.0 mmol/L   Sodium 145 135 - 145 mmol/L   Potassium 3.8 3.5 - 5.1 mmol/L   Calcium , Ion 1.32 1.15 - 1.40 mmol/L   HCT 25.0 (L) 39.0 - 52.0 %   Hemoglobin  8.5 (L) 13.0 - 17.0 g/dL   Patient temperature 62.7 C    Sample type ARTERIAL   Glucose, capillary     Status: Abnormal   Collection Time: 12/18/24  7:58 AM  Result Value Ref Range   Glucose-Capillary 110 (H) 70 - 99 mg/dL  Basic metabolic panel with GFR     Status: Abnormal   Collection Time: 12/18/24  8:24 AM  Result Value Ref Range   Sodium 142 135 - 145 mmol/L   Potassium 4.0 3.5 - 5.1 mmol/L   Chloride 113 (H) 98 - 111 mmol/L   CO2 23 22 - 32 mmol/L   Glucose, Bld 109 (H) 70 - 99 mg/dL   BUN 18 6 - 20 mg/dL   Creatinine, Ser 9.53 (L) 0.61 - 1.24 mg/dL   Calcium  9.0 8.9 - 10.3 mg/dL   GFR, Estimated >39 >39 mL/min   Anion gap 6 5 - 15    Assessment & Plan:  LOS: 25 days   Additional comments:I reviewed the patient's new clinical lab test results.   and I reviewed the patients  new imaging test results.    43 y/o M S/P AVR, MCR, Impella placement by Dr. Lucas 12/8   S/P right thoracentesis 12/11 with hemorrhage from intercostal -chest tube placed by TCTS.  S/p emergent IR embolizaton  R intercostal artery by Dr. Philip.    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 Continue TPN until goal rate reached. Noted plans for SLP eval and will follow results. - Impella removal 12/24   FEN: NPO, IVF per primary, TPN, TF as above ID: [email or web address] , micafungin ; resp cx 12/18 with few C albican, BCx 12/17 neg, BAL 12/23 rare yeast VTE: heparin  gtt Foley: in place Dispo: ICU  Almarie Pringle, [facility] Surgery Please see Amion for pager number during day hours 7:00am-4:30pm       12/18/2024  *Care during the described time interval was provided by me. I have reviewed this patient's available data, including medical history, events of note, physical examination and test results as part of my evaluation.    "

## 2024-12-19 DIAGNOSIS — I5021 Acute systolic (congestive) heart failure: Secondary | ICD-10-CM | POA: Diagnosis not present

## 2024-12-19 DIAGNOSIS — J9602 Acute respiratory failure with hypercapnia: Secondary | ICD-10-CM

## 2024-12-19 LAB — POCT I-STAT 7, (LYTES, BLD GAS, ICA,H+H)
Acid-base deficit: 2 mmol/L (ref 0.0–2.0)
Bicarbonate: 21.9 mmol/L (ref 20.0–28.0)
Calcium, Ion: 1.29 mmol/L (ref 1.15–1.40)
HCT: 27 % — ABNORMAL LOW (ref 39.0–52.0)
Hemoglobin: 9.2 g/dL — ABNORMAL LOW (ref 13.0–17.0)
O2 Saturation: 99 %
Potassium: 3.5 mmol/L (ref 3.5–5.1)
Sodium: 143 mmol/L (ref 135–145)
TCO2: 23 mmol/L (ref 22–32)
pCO2 arterial: 34.2 mmHg (ref 32–48)
pH, Arterial: 7.416 (ref 7.35–7.45)
pO2, Arterial: 142 mmHg — ABNORMAL HIGH (ref 83–108)

## 2024-12-19 LAB — COOXEMETRY PANEL
Carboxyhemoglobin: 1.9 % — ABNORMAL HIGH (ref 0.5–1.5)
Methemoglobin: <0.7 % (ref 0.0–1.5)
O2 Saturation: 85.9 %
Total hemoglobin: 7.9 g/dL — ABNORMAL LOW (ref 12.0–16.0)

## 2024-12-19 LAB — CBC
HCT: 24.1 % — ABNORMAL LOW (ref 39.0–52.0)
Hemoglobin: 7.9 g/dL — ABNORMAL LOW (ref 13.0–17.0)
MCH: 29.8 pg (ref 26.0–34.0)
MCHC: 32.8 g/dL (ref 30.0–36.0)
MCV: 90.9 fL (ref 80.0–100.0)
Platelets: 529 K/uL — ABNORMAL HIGH (ref 150–400)
RBC: 2.65 MIL/uL — ABNORMAL LOW (ref 4.22–5.81)
RDW: 18.1 % — ABNORMAL HIGH (ref 11.5–15.5)
WBC: 10 K/uL (ref 4.0–10.5)
nRBC: 0.3 % — ABNORMAL HIGH (ref 0.0–0.2)

## 2024-12-19 LAB — BASIC METABOLIC PANEL WITH GFR
Anion gap: 7 (ref 5–15)
BUN: 17 mg/dL (ref 6–20)
CO2: 23 mmol/L (ref 22–32)
Calcium: 8.7 mg/dL — ABNORMAL LOW (ref 8.9–10.3)
Chloride: 109 mmol/L (ref 98–111)
Creatinine, Ser: 0.43 mg/dL — ABNORMAL LOW (ref 0.61–1.24)
GFR, Estimated: >60 mL/min
Glucose, Bld: 115 mg/dL — ABNORMAL HIGH (ref 70–99)
Potassium: 3.6 mmol/L (ref 3.5–5.1)
Sodium: 139 mmol/L (ref 135–145)

## 2024-12-19 LAB — HEPARIN LEVEL (UNFRACTIONATED)
Heparin Unfractionated: <0.1 [IU]/mL — ABNORMAL LOW (ref 0.30–0.70)
Heparin Unfractionated: <0.1 [IU]/mL — ABNORMAL LOW (ref 0.30–0.70)

## 2024-12-19 LAB — MAGNESIUM: Magnesium: 1.7 mg/dL (ref 1.7–2.4)

## 2024-12-19 MED ORDER — MAGNESIUM SULFATE 2 GM/50ML IV SOLN
2.0000 g | Freq: Once | INTRAVENOUS | Status: AC
  Administered 2024-12-19: 2 g via INTRAVENOUS
  Filled 2024-12-19: qty 50

## 2024-12-19 MED ORDER — POTASSIUM CHLORIDE 20 MEQ PO PACK
40.0000 meq | PACK | Freq: Once | ORAL | Status: AC
  Administered 2024-12-19: 40 meq
  Filled 2024-12-19: qty 2

## 2024-12-19 MED ORDER — SPIRONOLACTONE 25 MG PO TABS
25.0000 mg | ORAL_TABLET | Freq: Every day | ORAL | Status: DC
  Administered 2024-12-19: 25 mg via ORAL
  Filled 2024-12-19: qty 1

## 2024-12-19 MED ORDER — AMIODARONE HCL 200 MG PO TABS
200.0000 mg | ORAL_TABLET | Freq: Two times a day (BID) | ORAL | Status: DC
  Administered 2024-12-19 (×2): 200 mg via ORAL
  Filled 2024-12-19 (×2): qty 1

## 2024-12-19 MED ORDER — STERILE WATER FOR INJECTION IV SOLN
INTRAVENOUS | Status: AC
  Filled 2024-12-19: qty 473.6

## 2024-12-19 MED ORDER — VITAL 1.5 CAL PO LIQD
1000.0000 mL | ORAL | Status: DC
  Administered 2024-12-19 – 2024-12-22 (×4): 1000 mL

## 2024-12-19 MED ORDER — ADULT MULTIVITAMIN W/MINERALS CH
1.0000 | ORAL_TABLET | Freq: Every day | ORAL | Status: DC
  Administered 2024-12-20 – 2024-12-28 (×9): 1
  Filled 2024-12-19 (×2): qty 1

## 2024-12-19 NOTE — Progress Notes (Signed)
 Ventilator patient ambulated with RN, NT and RT without any complications.

## 2024-12-19 NOTE — Progress Notes (Signed)
 Ventilator patient ambulated around unit with RN, NT, and RT without any complications.

## 2024-12-19 NOTE — Progress Notes (Addendum)
 ANTICOAGULATION CONSULT NOTE  Pharmacy Consult for heparin  Indication: bA/MVR  Allergies[1]  Patient Measurements: Height: 5' 10 (177.8 cm) Weight: 72.1 kg (158 lb 15.2 oz) IBW/kg (Calculated) : 73 Heparin  Dosing Weight: 70 kg   Vital Signs: Temp: 99.2 F (37.3 C) (12/27 1500) Temp Source: Oral (12/27 1500) BP: 129/95 (12/27 1200) Pulse Rate: 90 (12/27 1200)  Labs: Recent Labs    12/17/24 0452 12/18/24 0601 12/18/24 0824 12/18/24 2122 12/19/24 0508 12/19/24 0519 12/19/24 1708  HGB 8.1* 8.5*  --   --  7.9* 9.2*  --   HCT 24.8* 25.0*  --   --  24.1* 27.0*  --   PLT 375  --   --   --  529*  --   --   HEPARINUNFRC  --   --   --  <0.10* <0.10*  --  <0.10*  CREATININE 0.47*  --  0.46*  --  0.43*  --   --     Estimated Creatinine Clearance: 118.9 mL/min (A) (by C-G formula based on SCr of 0.43 mg/dL (L)).  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  Postop course complicated by ischemic bowel s/p exlap 12/11 with partial small bowel resection, abdomen left open.  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   Per Dr. Lucas, plan for 3 months of warfarin therapy with dual valve replacement. Not on anticoagulation prior to admission.  Pharmacy consulted for heparin  dosing.  Impella 5.5 removed 12/24, heparin  held. Ok to resume 12/26 with low therapeutic goal and slow titrations. **of note pt required ~1200 units/hr for heparin  level ~0.2**  Heparin  level remains undetectable (<0.1), on 1100 units/hr. No s/sx of bleeding or infusion issues. PICC line removed after levels were drawn - plan to run heparin  now centrally with peripheral sticks unless can use peripheral IV site for heparin  infusion.    Goal of Therapy:  Heparin  level 0.3-0.5 units/ml Monitor platelets by anticoagulation protocol: Yes   Plan:  Increase  heparin  to 1200 units/hr Heparin  level in 8 hours with AM labs  Monitor daily HL, CBC, and for s/sx of bleeding   Thank you for allowing pharmacy to participate in this patient's care,  Suzen Sour, PharmD, BCCCP Clinical Pharmacist  Phone: [phone] 12/19/2024 6:19 PM  Please check AMION for all [facility] Pharmacy phone numbers After 10:00 PM, call Main Pharmacy [phone]     [1] No Known Allergies

## 2024-12-19 NOTE — Progress Notes (Signed)
 PHARMACY - TOTAL PARENTERAL NUTRITION CONSULT NOTE   Indication: massive bowel resection   Patient Measurements: Height: 5' 10 (177.8 cm) Weight: 72.1 kg (158 lb 15.2 oz) IBW/kg (Calculated) : 73 TPN AdjBW (KG): 73.4 Body mass index is 22.81 kg/m. Usual Weight: weight 79kg on admission, unclear baseline.   Assessment:  43 yo M s/p aortic and mitral valve replacement with Impella on 12/8. Post-op complicated by persistent HgB drop with abdominal pain, found to have hemorrhage from R intercostal artery s/p chest tube and pneumatosis and concern for bowel ischemia. Patient went to OR on 12/12 for ex-lap found to have ischemia of distal ileum over 75cm without perforation. Patient was left in discontinuity and continued to require 3 vasopressors post-op, concerning for ongoing ischemia. Patient with diet throughout admission however noted nausea and abdominal pain during peri-op time period. On 12/21, tube feeds attempte but patient with vomiting so held again. Patient's EF is ~25%. Pharmacy consulted to manage TPN.   Impella removed 12/25.    Glucose / Insulin : no hx DM, A1c 6.1%, BG < 120, off insulin  Electrolytes: Na 139 (none in TPN), iCa 1.29/CoCa 10.1, Mg 1.7 others wnl Renal: SCr 0.4 (BL SCr ~ 0.7), BUN wnl 12/15 Lasix  60 mg IV x 2  12/16-12/19 Lasix  80 mg IV x BID 12/20 Lasix  80 mg IV x1 12/26 Lasix  40mg  IV x1 Hepatic: alk phos 166 up, AST/ALT 173/248 stable, tbili 1.6 down, albumin  2.2, TG 232 Intake / Output; MIVF: UOP 1.8 ml/kg/hr, drains 60 ml, chest tube 20 mL, stool 950 mL, Net IO Since Admission: -15,824 mL [12/19/24 0713]  GI Imaging: 12/11 CT: pneumatosis of small bowel with gas, concerning for bowel ischemia  12/12 KUB: Diffuse gaseous distention of colon evident, similar to prior. 12/17 CT: Status post right colectomy with right periumbilical ileostomy, Radiopaque material layering within the gallbladder, Moderate soft tissue edema of the abdomen and pelvis.  12/22  KUB: Nonobstructive bowel gas pattern. 12/25 KUB: mild SB Dilation, Nonobstructive bowel gas pattern GI Surgeries / Procedures:  12/12 ex-lap, distal ileum with necrosis without perforation, 75cm small bowel resection, left in discontinuity, VAC placed  12/14 ex lap, terminal ileum and ascending colon had patchy necrosis, right colectomy, abdomen left open, placement of wound vac,  12/16 dusky at end of small bowel, small bowel resection, end ileostomy, closure  12/19 Cortrak placed 12/21 Cortrak removed, NGT placed 12/25 patient pulled NGT, cortrak placed  Central access: PICC placed 12/04/24 TPN start date: 12/04/24  Nutritional Goals:  Goal concentrated TPN rate is 75 mL/hr, provides 133 g protein, 2257 kcals   RD Estimated Needs Total Energy Estimated Needs: 2200-2400kcals Total Protein Estimated Needs: 125-150 g Total Fluid Estimated Needs: 1.8 L  Current Nutrition:  TPN + NPO  12/20 CLD, TF 30 ml/hr- ostomy not working yet >> emesis > Hold TF  12/21 NGT with dark thick, bilious effluent 12/22 thin brown stool from ostomy  12/23 propofol  at 26 ml/hr, Remove lipids from TPN  12/24 TF @ 19ml/hr, no titration for at least 24hr 12/25 TF @ 51ml/hr>> stopped at 1900; propofol  off and plan to stay off  12/26 TF off > restart at 24ml/hr (goal 60 ml/hr) 12/27 TF at 30 ml/hr  Plan:  Discussed with surgery, tolerating TF at half goal, will advance to goal, ok to halve concentrated TPN at 40 ml/hr, provides 71 g protein, 1203 kcals, meeting ~53% estimated needs  Electrolytes in TPN: Na 0 mEq/L, K 50 mEq/L, Ca 3 mEq/L, decrease Mg  15 mEq/L d/t max allowed, Phos 14 mmol/L, max acetate  Remove standard MVI and trace elements to TPN, give per tube Give Mag 2g IV x1 Give Kcl 40 mEq x1 Monitor TPN labs on Mon/Thurs and PRN F/u tube feed tolerance and titration, stop TPN as able     Thank you for allowing pharmacy to be a part of this patients care.  Jinnie Door, PharmD, BCPS,  BCCP Clinical Pharmacist  Please check AMION for all [facility] Pharmacy phone numbers After 10:00 PM, call Main Pharmacy [phone]

## 2024-12-19 NOTE — Progress Notes (Signed)
 SLP Cancellation Note  Patient Details Name: Alejandro Lopez MRN: [id number] DOB: September 07, 1981   Cancelled treatment:       Reason Eval/Treat Not Completed: Other (comment) SLP spoke with CCM (Dr. Gretta) as well as patient's RN regarding this patient. Unfortunately, there are no SLP's working today who are credentialed to perform PMV evaluations with patient's on the vent. In addition, for optimal care for this patient, he will benefit from a high quality FEES image(not the lower quality, portable FEES image) and this can only be completed Monday-Friday. SLP spoke briefly with patient in room and he acknowledged understanding this. SLP will f/u on Monday.   Norleen IVAR Blase, MA, CCC-SLP Speech Therapy  12/19/2024, 3:27 PM

## 2024-12-19 NOTE — Progress Notes (Signed)
 ANTICOAGULATION CONSULT NOTE  Pharmacy Consult for heparin  Indication: bA/MVR  Allergies[1]  Patient Measurements: Height: 5' 10 (177.8 cm) Weight: 75.3 kg (166 lb 0.1 oz) IBW/kg (Calculated) : 73 Heparin  Dosing Weight: 70 kg   Vital Signs: Temp: 97.8 F (36.6 C) (12/27 0345) Temp Source: Axillary (12/27 0345) BP: 118/81 (12/27 0400) Pulse Rate: 95 (12/27 0430)  Labs: Recent Labs    12/17/24 0452 12/18/24 0601 12/18/24 0824 12/18/24 2122 12/19/24 0508 12/19/24 0519  HGB 8.1* 8.5*  --   --  7.9* 9.2*  HCT 24.8* 25.0*  --   --  24.1* 27.0*  PLT 375  --   --   --  529*  --   HEPARINUNFRC  --   --   --  <0.10* <0.10*  --   CREATININE 0.47*  --  0.46*  --   --   --     Estimated Creatinine Clearance: 120.4 mL/min (A) (by C-G formula based on SCr of 0.46 mg/dL (L)).  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  Postop course complicated by ischemic bowel s/p exlap 12/11 with partial small bowel resection, abdomen left open.  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   Per Dr. Lucas, plan for 3 months of warfarin therapy with dual valve replacement. Not on anticoagulation prior to admission.  Pharmacy consulted for heparin  dosing.  Impella 5.5 removed 12/24, heparin  held. Ok to resume 12/26 with low therapeutic goal and slow titrations. **of note pt required ~1200 units/hr for heparin  level ~0.2**  Heparin  level undetectable on infusion at 800 units/hr. No issues with line or bleeding reported per RN.  12/27 AM update:  Heparin  level undetectable   Goal of Therapy:  Heparin  level 0.2-0.3 units/ml Monitor platelets by anticoagulation protocol: Yes   Plan:  Increase heparin  to 1100 units/hr Heparin  level in 8 hours  Lynwood Mckusick, PharmD, BCPS Clinical Pharmacist Phone: [phone]             [1] No  Known Allergies

## 2024-12-19 NOTE — Progress Notes (Signed)
 Assessed PICC line.  White port cap changed and flushed.  Able to flush slowly and obtain scant blood return.  Notified Dr Gretta of assessment, states she still wants PICC to be removed and CVC to remain in place.  TNA infusing currently in PICC.  Will remove PICC once TNA removed this pm/  Herold RN at bedside also aware and will place IV Team consult once done.  Dr Gretta also aware and approves of current plan.  If decision changed to keep PICC, the white port will need to have tpa instilled.

## 2024-12-19 NOTE — Progress Notes (Signed)
 SLP Cancellation Note  Patient Details Name: Alejandro Lopez MRN: [id number] DOB: [birth year]   Cancelled treatment:       Reason Eval/Treat Not Completed: Other (comment) New orders received for swallow evaluation. Patient with trach on vent and per RN, plan is for vent weaning over the next few days. SLP will follow for readiness.   Norleen IVAR Blase, MA, CCC-SLP Speech Therapy  12/19/2024, 1:49 PM

## 2024-12-19 NOTE — Progress Notes (Signed)
 Patient ID: Alejandro Lopez, male   DOB: [birth year], 43 y.o.   MRN: [id number]     Advanced Heart Failure Rounding Note  Cardiologist: None  Chief Complaint: Valvular Heart Disease & HFrEF Patient Profile  43 y.o. male w/ h/o heavy alcohol use (at least 5 drinks per evening) and h/o asthma admitted w/ acute systolic heart failure. Strong family history of cardiac disease: mother and maternal grandmother had heart failure and father with CAD. Admitted with acute HFrEF/cardiogenic shock. Echo w/ severe AR, MR and LV dysfunction.    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  12/9 VT/VF ? vagal episode. Extubated 12/11: S/p R thoracentesis with resultant hemothorax.  Chest tube placed. S/p R intercostal artery coil (IR). S/p exp lap for ischemic bowel resection, bowel left in discontinuity 12/15: Back to OR for ischemic R colon and mesenteric ischemia. S/p exp/lap, wound vac exchange, R colectomy without anastomosis.  12/16 Back to OR for small bowel resection and ileostomy  12/18 OR for VATS/hemothorax washout, pericardial window was not done.  12/19 extubated 12/21 Reintubated. Ileus 12/23 S/p Trach. Fever post procedure, Tmax 101. Abx switched to cefepime    12/24 Impella removed 12/25: DBA stopped. Diuretics held d/t low CVPs  Subjective:    Off DBA. Co-ox 86% CVP 8 Diuresed 4L with IV lasix  yesterday  Tolerated 1-2 hours of vent weaning yesterday. Walking halls today. Wants to eat.   Remains in NSR   Objective:   Weight Range: 72.1 kg Body mass index is 22.81 kg/m.   Vital Signs:   Temp:  [97.8 F (36.6 C)-99.1 F (37.3 C)] 98.7 F (37.1 C) (12/27 0718) Pulse Rate:  [80-111] 94 (12/27 0700) Resp:  [17-33] 23 (12/27 0700) BP: (104-149)/(61-94) 118/89 (12/27 0700) SpO2:  [97 %-100 %] 100 % (12/27 0700) FiO2 (%):  [40 %] 40 % (12/27 0718) Weight:  [72.1 kg] 72.1 kg (12/27 0500) Last BM Date : 12/18/24  Weight change: Filed Weights    12/17/24 0600 12/18/24 0500 12/19/24 0500  Weight: 77.8 kg 75.3 kg 72.1 kg    Intake/Output:   Intake/Output Summary (Last 24 hours) at 12/19/2024 0919 Last data filed at 12/19/2024 0700 Gross per 24 hour  Intake 3005.88 ml  Output 3810 ml  Net -804.12 ml     Physical Exam   General:  Sitting up in bed. No resp difficulty HEENT: normal Neck: supple.+ trach Cor: Regular rate & rhythm. No rubs, gallops or murmurs. Lungs: clear Abdomen: soft, nontender, nondistended.Good bowel sounds.+ ostomy Extremities: no cyanosis, clubbing, rash, edema Neuro: alert & orientedx3, cranial nerves grossly intact. moves all 4 extremities w/o difficulty. Affect pleasant  Telemetry   Sinus 90-100 Personally reviewed  Labs    CBC Recent Labs    12/17/24 0452 12/18/24 0601 12/19/24 0508 12/19/24 0519  WBC 12.0*  --  10.0  --   HGB 8.1*   < > 7.9* 9.2*  HCT 24.8*   < > 24.1* 27.0*  MCV 90.8  --  90.9  --   PLT 375  --  529*  --    < > = values in this interval not displayed.   Basic Metabolic Panel Recent Labs    87/74/74 0452 12/18/24 0435 12/18/24 0601 12/18/24 0824 12/19/24 0508 12/19/24 0519  NA 142  --    < > 142 139 143  K 4.2  --    < > 4.0 3.6 3.5  CL 113*  --   --  113* 109  --   CO2 22  --   --  23 23  --   GLUCOSE 114*  --   --  109* 115*  --   BUN 21*  --   --  18 17  --   CREATININE 0.47*  --   --  0.46* 0.43*  --   CALCIUM  9.0  --   --  9.0 8.7*  --   MG 2.0 2.3  --   --  1.7  --   PHOS 3.1  --   --   --   --   --    < > = values in this interval not displayed.   Liver Function Tests Recent Labs    12/17/24 0452  AST 173*  ALT 248*  ALKPHOS 166*  BILITOT 1.6*  PROT 6.2*  ALBUMIN  2.2*   No results for input(s): LIPASE, AMYLASE in the last 72 hours. Cardiac Enzymes No results for input(s): CKTOTAL, CKMB, CKMBINDEX, TROPONINI in the last 72 hours.  BNP: BNP (last 3 results) No results for input(s): BNP in the last 8760  hours.  ProBNP (last 3 results) Recent Labs    11/22/24 1958  PROBNP 3,354.0*     D-Dimer No results for input(s): DDIMER in the last 72 hours.  Hemoglobin A1C No results for input(s): HGBA1C in the last 72 hours. Fasting Lipid Panel Recent Labs    12/17/24 0452  TRIG 232*    Thyroid Function Tests No results for input(s): TSH, T4TOTAL, T3FREE, THYROIDAB in the last 72 hours.  Invalid input(s): FREET3  Other results:   Imaging    DG CHEST PORT 1 VIEW Result Date: 12/04/2024 CLINICAL DATA:  Ventilator dependence. EXAM: PORTABLE CHEST 1 VIEW COMPARISON:  12/03/2024 FINDINGS: Endotracheal tube tip is approximately 3.8 cm above the base of the carina. The NG tube passes into the stomach although the distal tip position is not included on the film. Right-sided Impella device is stable in position. A left IJ pulmonary artery catheter tip overlies expected location of the interlobar pulmonary artery. Right pleural drain again noted with persistent right pleural fluid. Basilar collapse/consolidation noted, IMPRESSION: 1. No substantial interval change. 2. Persistent right pleural effusion with right base collapse/consolidation. 3. Support apparatus as above. Electronically Signed   By: Camellia Candle M.D.   On: 12/04/2024 07:15   DG Abd 1 View Result Date: 12/04/2024 CLINICAL DATA:   Routine adult health maintenance Best images obtainable due to patient's condition EXAM: ABDOMEN - 1 VIEW COMPARISON:  12/04/2024 FINDINGS: NG tube tip is in the distal stomach. Diffuse gaseous distention of colon evident, similar to prior. IMPRESSION: NG tube tip is in the distal stomach. Electronically Signed   By: Camellia Candle M.D.   On: 12/04/2024 07:11   CT Angio Chest/Abd/Pel for Dissection W and/or W/WO Addendum Date: 12/03/2024 ADDENDUM #1 ADDENDUM: ---------------------------------------------------- These results were called to Dr. Quoc Tome  at 10:15 pm on 12/03/2024. Electronically  signed by: Franky Crease MD 12/03/2024 10:23 PM EST RP Workstation: [id number]   Result Date: 12/03/2024 ORIGINAL REPORT EXAM: CT CHEST, ABDOMEN AND PELVIS WITH AND WITHOUT CONTRAST 12/03/2024 09:52:07 PM TECHNIQUE: CT of the chest, abdomen and pelvis was performed with and without the administration of intravenous contrast. Multiplanar reformatted images are provided for review. Automated exposure control, iterative reconstruction, and/or weight based adjustment of the mA/kV was utilized to reduce the radiation dose to as low as reasonably achievable. COMPARISON: None available. CLINICAL HISTORY: Acute aortic syndrome (AAS) suspected;  Evaluate for intercostal bleeding or other sources after thoracentesis; also evaluate bowel. FINDINGS: CHEST: MEDIASTINUM AND LYMPH NODES: Mild cardiomegaly. Impella device tip in the left ventricle. Swan-ganz catheter tip in the central right pulmonary artery. No evidence of aortic aneurysm or dissection. The central airways are clear. No mediastinal, hilar or axillary lymphadenopathy. LUNGS AND PLEURA: Right chest tube in place. Moderate to large right pleural effusion with compressive atelectasis in the right middle lobe and right lower lobe. There appears to be active extravasation of contrast posteriorly between the right 9th and 10th ribs and likely into the pleural space, possibly related to prior thoracentesis. The pleural fluid appears complex on the right, likely hemothorax. Small left pleural effusion with compressive atelectasis in the left lower lobe. Tiny right pneumothorax. Biapical subpleural blebs. No evidence of pulmonary embolus. ABDOMEN AND PELVIS: LIVER: Gas is seen branching peripherally in the liver compatible with portal venous gas. GALLBLADDER AND BILE DUCTS: Gallbladder is unremarkable. No biliary ductal dilatation. SPLEEN: No acute abnormality. PANCREAS: No acute abnormality. ADRENAL GLANDS: No acute abnormality. KIDNEYS, URETERS AND BLADDER: No stones in  the kidneys or ureters. No hydronephrosis. No perinephric or periureteral stranding. Urinary bladder is unremarkable. GI AND BOWEL: Stomach and small bowel are decompressed. Diffuse gaseous distention of the colon suggests ileus. Moderate stool burden in the rectosigmoid colon. Pneumatosis noted in small bowel loops in the lower pelvis with air in the mesenteric veins feeding these small bowel loops. Pneumatosis also noted in the right colon wall. REPRODUCTIVE ORGANS: No acute abnormality. PERITONEUM AND RETROPERITONEUM: No ascites. No free air. VASCULATURE: Aorta is normal in caliber. ABDOMINAL AND PELVIS LYMPH NODES: No lymphadenopathy. BONES AND SOFT TISSUES: No acute osseous abnormality. No focal soft tissue abnormality. IMPRESSION: 1. Active contrast extravasation between the right 9th and 10th ribs with likely extension into the pleural space, suspicious for ongoing intercostal bleeding likely related to recent thoracentesis. 2. Moderate to large right pleural effusion compatible with hemothorax with compressive atelectasis in the right middle and lower lobes; right chest tube present; tiny right pneumothorax. 3. No evidence of aortic aneurysm, aortic dissection, or pulmonary embolus. 4. Pneumatosis involving small bowel loops in the lower pelvis and the right colon with mesenteric venous gas and portal venous gas, concerning for bowel ischemia. Recommend surgical consultation. 5. Small left pleural effusion with compressive atelectasis in the left lower lobe. 6. . Attempts are being made to contact the ordering physician with the results. An addendum will be made at that time. Electronically signed by: Franky Crease MD 12/03/2024 10:09 PM EST RP Workstation: [id number]   DG Chest Port 1 View Result Date: 12/03/2024 EXAM: 1 VIEW(S) XRAY OF THE CHEST 12/03/2024 07:57:00 PM COMPARISON: Portable chest today at 7:02 pm. CLINICAL HISTORY: Encounter for chest tube placement. FINDINGS: LINES, TUBES AND DEVICES: A  pigtail chest tube has been inserted on the right through the 6th intercostal space, with the pigtail in the lateral upper right thorax. Left IJ Swan-Ganz line again terminates in the distal right pulmonary artery. Stable positioning of right subclavian Impella device. LUNGS AND PLEURA: No pneumothorax. Moderate to large right pleural effusion is still present but decreased in the interval. Small left pleural effusion unchanged. The right mid to lower lung is obscured by pleural fluid. There is patchy consolidation in the left lower lung field. Left mid and both apical lungs are clear. HEART AND MEDIASTINUM: Sternotomy sutures and two cardiac valve replacements are again shown. There is mild cardiomegaly and mild central vascular prominence as before.  BONES AND SOFT TISSUES: No acute osseous abnormality. IMPRESSION: 1. Right pigtail chest tube in position with no measurable pneumothorax. 2. Moderate to large right pleural effusion, decreased compared to earlier today. 3. Patchy consolidation in the left lower lung field. 4. Small left pleural effusion, unchanged. 5. Cardiomegaly . Electronically signed by: Francis Quam MD 12/03/2024 08:10 PM EST RP Workstation: [id number]   DG CHEST PORT 1 VIEW Result Date: 12/03/2024 EXAM: 1 VIEW(S) XRAY OF THE CHEST 12/03/2024 07:05:00 PM COMPARISON: None available. CLINICAL HISTORY: ABLA (acute blood loss anemia). FINDINGS: LINES, TUBES AND DEVICES: Impella device and Swan-Ganz catheter remain in place, unchanged. LUNGS AND PLEURA: Enlarging right pleural effusion, now large with atelectasis throughout the right lung. Vascular congestion and left basilar atelectasis. Low lung volumes. No pneumothorax. HEART AND MEDIASTINUM: No acute abnormality of the cardiac and mediastinal silhouettes. BONES AND SOFT TISSUES: No acute osseous abnormality. IMPRESSION: 1. Enlarging right pleural effusion, now large, with associated right lung atelectasis. 2. Vascular congestion with left  basilar atelectasis. 3. Low lung volumes. Electronically signed by: Franky Crease MD 12/03/2024 07:10 PM EST RP Workstation: [id number]   DG Chest Port 1 View Result Date: 12/03/2024 CLINICAL DATA:  Pleural effusion, status post thoracentesis EXAM: PORTABLE CHEST 1 VIEW COMPARISON:  12/03/2024 FINDINGS: Single frontal view of the chest demonstrates stable left internal jugular flow directed central venous catheter, tip overlying the right infrahilar region. Stable mitral and aortic valve prostheses. Cardiac silhouette remains enlarged. Lung volumes are diminished, with bibasilar veiling opacities, right greater than left, consistent with consolidation and effusions. No appreciable change since prior study. No evidence of pneumothorax. IMPRESSION: 1. Persistent bibasilar consolidation and effusions, right greater than left. 2. No evidence of pneumothorax. 3. Stable enlarged cardiac silhouette. Electronically Signed   By: Ozell Daring M.D.   On: 12/03/2024 16:10    Medications:   Scheduled Medications:  arformoterol   15 mcg Nebulization BID   Chlorhexidine  Gluconate Cloth  6 each Topical Daily   colchicine   0.6 mg Per Tube Daily   famotidine   20 mg Per Tube BID   feeding supplement (PROSource TF20)  60 mL Per Tube Daily   folic acid   1 mg Per Tube Daily   Gerhardt's butt cream   Topical TID   lidocaine   1 patch Transdermal Q24H   mouth rinse  15 mL Mouth Rinse Q2H   pantoprazole  (PROTONIX ) IV  40 mg Intravenous Daily   potassium chloride   40 mEq Per Tube Once   QUEtiapine   25 mg Per Tube QHS   revefenacin   175 mcg Nebulization Daily   sodium chloride  flush  3 mL Intravenous Q12H   thiamine   100 mg Per Tube Daily    Infusions:  sodium chloride  20 mL/hr at 12/17/24 1412   amiodarone  30 mg/hr (12/19/24 0700)   feeding supplement (VITAL 1.5 CAL) 30 mL/hr at 12/19/24 0700   heparin  1,100 Units/hr (12/19/24 0700)   magnesium  sulfate bolus IVPB     micafungin  (MYCAMINE ) 100 mg in sodium  chloride 0.9 % 100 mL IVPB Stopped (12/18/24 1901)   TPN ADULT (ION) 75 mL/hr at 12/19/24 0700    PRN Medications: sodium chloride , acetaminophen  (TYLENOL ) oral liquid 160 mg/5 mL, albuterol , hydrALAZINE , LORazepam , morphine  injection, ondansetron  (ZOFRAN ) IV, mouth rinse, oxyCODONE , polyethylene glycol, sodium chloride , sodium chloride  flush  Assessment/Plan   1.  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  Valvular Disease  - severe MR posteriorly directed, mod TR. Likely functional.  - severe AI.-->CMR - Severe AR/MR  -  S/P bioprosthetic AVR/MVR - On heparin   2. Acute hypoxic/hypercarbic respiratory failure - reintubated 12/21 due to hypercarbia  - suspect mostly muscle weakness and ab distension +/- PNA - s/p Trach 12/23 - weaning per CCM  3. Acute Systolic Heart Failure w/ Biventricular Dysfunction >> caridogenic shock - HS trop not c/w ACS but EKG w/ anteroseptal Qs  - CMRI Severe AR/MR. LVEF 35%  - Cath- normal cors, RA5, PA 32/17 (24), PVR 2.57 Papi 3. CO 4.9 CI 3.8 - Post-op Echo 12/02/24: EF < 20% with severe LVH (new, ?inflammatory), moderately decreased RV function, stable bioprosthetic MV and AoV.  - Impella removed 12/24  - off DBA. Hemodynamics ok  - Got IV lasix  yesterday out 4L. Volume improved CVP 8 - Cor-trak placed. Start GDMT  - Add spiro 12.5 - can use IV hydral PRN for SBP > 150   4. Ischemic bowel - S/p exp/lap 12/11. Patchy ischemia of the distal ileum extending over about 75 cm without perforation. Ischemic segment resected.  Colon was distended with gas but viable. Small bowel left in discontinuity.  - Back to OR 12/15 for ischemic R colon and mesenteric ischemia. S/p exp/lap, wound vac exchange, R colectomy without anastomosis.  - Returned to OR 12/16 for ileostomy creation.  - GSU following. Ileostomy working well - Remains on TPN  - Cor-trak placed. Starting TFs  5. R hemothorax - Hemothorax s/p right thoracentesis with intercostal artery injury.  Massive bleeding requiring multiple products and development of hemorrhagic shock, now improved. S/p IR embolization.  - S/p VATS 12/18 with right chest washout.  - H/H stable  - Chest tube in place  6.  ETOH Abuse - heavy drinker, at least 5 drinks per evening - may be contributing to CM, reduction of intake imperative  - No evidence of withdrawal   7 . DMII  - Hgb A1C 6.1 - On SSI.  8. ID - T max 101 F overnight - BAL w/ enterobacter cloacae and candida albicans. Zosyn  >> meropenem  12/21, vanc off and micafungin  started 12/22 - Cefepime  added 12/23.  9. AKI - AKI with shock.  - Resolved  10. VT - Quiescent - On amiodarone  gtt. Switch to po when able   11. Pericardial effusion - There is a moderate effusion without tamponade, noted again by echo 12/16.  Will need to follow over time.  - Limited echo 12/17 with moderate effusion.  - This was not drained at time of VATS/right hemothorax washout on 12/18. Appeared fibrinous.  12 Atrial flutter - In and out of AFL with controlled rate. Currently NSR - Remains in NSR. Can switch to po amio - Now on heparin . No bleeding  Toribio Fuel, MD  9:25 AM

## 2024-12-19 NOTE — Progress Notes (Incomplete)
 Wife gave patient a beverage after being educated on risks of patient drinking before being clear by MD.

## 2024-12-19 NOTE — Progress Notes (Signed)
 "  NAME:  Alejandro Lopez, MRN:  [id number], DOB:  1981-04-09, LOS: 26 ADMISSION DATE:  11/22/2024, CONSULTATION DATE:  12/8 REFERRING MD:  Lucas, CHIEF COMPLAINT:  post cardiac surgery critical care services    History of Present Illness:  43 year old male with history of hypertension, alcohol and tobacco abuse, presented to the emergency room on 11/30 with approximately 1 month history of progressive dyspnea accompanied by lower extremity swelling extending up to the level of his scrotum and abdomen. Diagnostic evaluation by echocardiogram showed left ventricular ejection fraction 35 to 40% with grade 3 diastolic dysfunction this was further complicated by severe mitral valve regurgitation and aortic insufficiency because of this he was transferred to [facility] for further evaluation. He underwent cardiac catheterization on 12/4: Right heart hemodynamic parameters showed baseline right atrial pressure 5 mmHg, PA pressure 32/17, pulmonary capillary wedge pressure at 14 estimated Fick 4.9 L/min with cardiac index Fick calculated at 2.59 His PAPi was 3 Left heart cath was negative for coronary artery disease Went to OR 12/8 for MVR and AVR w/ impella insertion. PCCM asked to assist w/ post op care   OR course EBL: 1735 Received  Products: cryo 92ml, FFP 401 Also received DDAVP , 2200 crystalloid, 250ml albumin   Cell saver 1125 Pump time 4hrs Clamp time 2hrs 50 min  Events: multiple defibrillations intra-op  Intra-op ECHO EF estimated 40%  Pertinent  Medical History  Tobacco abuse, alcohol abuse, hypertension  Significant Hospital Events: Including procedures, antibiotic start and stop dates in addition to other pertinent events   11/30 admitted/. ECHO 35 to 40% with grade 3 diastolic dysfunction this was further complicated by severe mitral valve regurgitation and aortic insufficiency 12/4 left and right heart cath 12/8 AVR and MVR w/ bioprosthetic valves. Arrived on icu w/  Impella 5.5 MCS at flow 4 lpm and P 7. Received 2 more PLTs, 2 FFP and 1 cryo in first 6 hrs post op for cont blood loss/oozing. Required NE, epi and milrinone . Good flow on IMPELLA but minimal pulsatility  at P7. Placed on Amio gtt for VT 12/9 received 1 unit PRBC over night for hgb down to 7.5, hgb 8 getting second unit of blood. Still on P7 3.5 lPM. Extubated. 12/11 1L thora, later hemorrhagic shock and hemothorax> IR embolization. Also found to have ischemic bowel and underwent ex-lap with partial resection of ileum. 12/15 plan for ostomy tomorrow, transition epi to levophed   12/16 plan back to OR for ostomy placement  12/17 hemodynamically stable off pressors on Milrinone  0.25 , unable to tolerate SBT, plan for CT chest and heparin  gtt, high fevers>micafungin  12/18 VATS for decortication R chest 12/19 extubated 12/20-12/21 abx to meropenem , aspiration/ agitation/ weakness leading to CO2 retention, RV dysfunction and reintubation, L CVC removed; R CVC placed 12/22: Failed SBT d/t wob 12/23: Bedside tracheostomy  12/24 Impella 5 5 was removed in OR, spiked fever with Tmax 101.1, Precedex  stopped, remained off pressors on dobutamine  at 2.5 12/25 tolerated 1 hr weaning on vent, off DBA 12/26 foley & chest tubes out  Interim History / Subjective:  Afebrile overnight. Tolerated 1-2 hours of vent weaning yesterday; stopped for tachypnea and WOB. This morning he wants to eat and drink.  Objective    Blood pressure 118/81, pulse 95, temperature 97.8 F (36.6 C), temperature source Axillary, resp. rate (!) 26, height 5' 10 (1.778 m), weight 75.3 kg, SpO2 100%. CVP:  [1 mmHg-12 mmHg] 5 mmHg  Vent Mode: PRVC FiO2 (%):  [40 %]  40 % Set Rate:  [20 bmp] 20 bmp Vt Set:  [580 mL] 580 mL PEEP:  [5 cmH20] 5 cmH20 Pressure Support:  [5 cmH20] 5 cmH20 Plateau Pressure:  [16 cmH20-23 cmH20] 23 cmH20   Intake/Output Summary (Last 24 hours) at 12/19/2024 0635 Last data filed at 12/19/2024 0400 Gross  per 24 hour  Intake 3347.07 ml  Output 3520 ml  Net -172.93 ml   Filed Weights   12/16/24 0500 12/17/24 0600 12/18/24 0500  Weight: 79.3 kg 77.8 kg 75.3 kg       Physical exam: General: ill appearing man lying in bed in NAD HEENT: [place]/AT, eyes anicteric Neck: trach with sutures, some skin breakdown below R lower flange. Neuro: awake, alert, moving all extremities Chest: breathing comfortably on MV, scattered rhonchi improved with suctioning; thin secretions Heart: S1S2, RRR Abdomen: Soft, NT. Ostomy with watery output.   7.42/34/142/22 BUN 17 Cr 0.43  12/23 BAL few candida   Patient Lines/Drains/Airways Status     Active Line/Drains/Airways     Name Placement date Placement time Site Days   PICC Triple Lumen 12/04/24 Right Brachial 40 cm 0 cm 12/04/24  1623  -- 15   CVC Triple Lumen 12/13/24 Right Internal jugular 12/13/24  1600  -- 6   Negative Pressure Wound Therapy Abdomen Medial 12/10/24  0730  --  9   Ileostomy Standard (end) RLQ 12/08/24  1352  RLQ  11   Small Bore Feeding Tube 10 Fr. Left nare Marking at nare/corner of mouth 63 cm 12/18/24  1105  Left nare  1   Tracheostomy Shiley Flexible 6 mm Cuffed 12/15/24  1143  6 mm  4   Wound 11/30/24 1512 Surgical Closed Surgical Incision Chest Other (Comment) 11/30/24  1512  Chest  19   Wound 11/30/24 1512 Surgical Closed Surgical Incision Chest Right 11/30/24  1512  Chest  19   Wound 12/05/24 1000 Pressure Injury Buttocks Mid Deep Tissue Pressure Injury - Purple or maroon localized area of discolored intact skin or blood-filled blister due to damage of underlying soft tissue from pressure and/or shear. 12/05/24  1000  Buttocks  14   Wound 12/05/24 1033  Back Left;Upper 12/05/24  1033  Back  14   Wound 12/08/24 1408 Surgical Closed Surgical Incision Abdomen Other (Comment) 12/08/24  1408  Abdomen  11            Resolved problem list  AKI Lactic acidosis, resolved Constipation Hyponatremia Right-sided hemothorax  status post IR embolization 12/11, status post VATS 12/18 Assessment and Plan  Acute biventricular HFrEF with cardiogenic shock status post Impella 5.5, removed on 12/24 Severe mitral regurgitation and aortic insufficiency status post bioprosthetic mitral valve and aortic valve replacement Nonsustained VT, no more episodes Moderate circumferential pericardial effusion -doing well off inotropes and MCS -will eventually need GDMT as tolerated -heparin  gtt; goal heparin  level 0.3-0.5 -con't amiodarone  for rhythm control -tele monitoring  Acute respiratory failure with hypoxia and hypercapnia status post tracheostomy -trach care per protocol; suture removal on 12/30 -mepilex under lower flange of trach -LTVV -VAP prevention protocol -cont' vent weaning efforts -SLP consulted for PMV trials; can do this on the vent as needed  Sepsis with septic shock due to recurrent aspiration pneumonia -Enterobacter -completed cefepime  course on 12/26- monitor off -con't micafungin  through 12/29  Ischemic bowel s/p laparotomy with partial ileum resection end-ileostomy  Postop ileus, improving -con't cortrak with TF; escalating towards goal as tolerated. Will need fiber and imodium  to slow motility eventually -  appreciate surgery's management  Acute septic encephalopathy, improved -family has been at bedside throughout -PT, OT, SLP -con't efforts at mobility  Shock liver -maintain adequate perfusion  Hypertriglyceridemia due to propofol  infusion, now TPN -monitor; wean off TPN when able  H/o alcohol abuse -vitamins, recommend total cessation  Anemia of critical illness and previous ABLA from hemothorax, operative blood loss -transfuse for Hb <7 or hemodynamically significant bleeding  Agitation> situational due to prolonged illness Debility -PT, OT, SLP -anticipate he will need CIR  Wife not at bedside this morning, will update her later.  DVT: heparin  gtt  GI: pepcid  Lines: d/c PICC  today; keep RIJ CVC   sodium chloride  20 mL/hr at 12/17/24 1412   amiodarone  30 mg/hr (12/19/24 0400)   feeding supplement (VITAL 1.5 CAL) 30 mL/hr at 12/19/24 0400   heparin  950 Units/hr (12/19/24 0400)   micafungin  (MYCAMINE ) 100 mg in sodium chloride  0.9 % 100 mL IVPB Stopped (12/18/24 1901)   TPN ADULT (ION) 75 mL/hr at 12/19/24 0400   This patient is critically ill with multiple organ system failure which requires frequent high complexity decision making, assessment, support, evaluation, and titration of therapies. This was completed through the application of advanced monitoring technologies and extensive interpretation of multiple databases. During this encounter critical care time was devoted to patient care services described in this note for 33 minutes.  Leita SHAUNNA Gaskins, DO 12/19/2024 7:52 AM [facility]  For contact information, see Amion. If no response to pager, please call PCCM consult pager. After hours, 7PM- 7AM, please call Elink.  "

## 2024-12-19 NOTE — Progress Notes (Signed)
 3 Days Post-Op Procedures (LRB): REMOVAL, CARDIAC ASSIST DEVICE, IMPELLA (Right) Subjective:  Stable night. Asking about taking po's.   Objective: Vital signs in last 24 hours: Temp:  [97.8 F (36.6 C)-99.3 F (37.4 C)] 98.7 F (37.1 C) (12/27 0718) Pulse Rate:  [80-111] 94 (12/27 0700) Cardiac Rhythm: Normal sinus rhythm;Sinus tachycardia (12/27 0400) Resp:  [17-39] 23 (12/27 0700) BP: (104-167)/(61-102) 118/89 (12/27 0700) SpO2:  [97 %-100 %] 100 % (12/27 0700) FiO2 (%):  [40 %] 40 % (12/27 0718) Weight:  [72.1 kg] 72.1 kg (12/27 0500)  Hemodynamic parameters for last 24 hours: CVP:  [1 mmHg-13 mmHg] 3 mmHg  Intake/Output from previous day: 12/26 0701 - 12/27 0700 In: 3172.7 [I.V.:2237.7; NG/GT:630; IV Piggyback:305] Out: 4080 [Urine:3050; Drains:60; Stool:950; Chest Tube:20] Intake/Output this shift: No intake/output data recorded.  General appearance: alert and cooperative Neck: trach site looks ok Neurologic: intact Heart: regular rate and rhythm Lungs: coarse BS bilaterally Abdomen: soft, non-tender; bowel sounds present. Extremities: no edema Wound: chest incision ok. Tiny opening in mid portion which I think was from suture knot. Sternum feels stable.  Lab Results: Recent Labs    12/17/24 0452 12/18/24 0601 12/19/24 0508 12/19/24 0519  WBC 12.0*  --  10.0  --   HGB 8.1*   < > 7.9* 9.2*  HCT 24.8*   < > 24.1* 27.0*  PLT 375  --  529*  --    < > = values in this interval not displayed.   BMET:  Recent Labs    12/18/24 0824 12/19/24 0508 12/19/24 0519  NA 142 139 143  K 4.0 3.6 3.5  CL 113* 109  --   CO2 23 23  --   GLUCOSE 109* 115*  --   BUN 18 17  --   CREATININE 0.46* 0.43*  --   CALCIUM  9.0 8.7*  --     PT/INR: No results for input(s): LABPROT, INR in the last 72 hours. ABG    Component Value Date/Time   PHART 7.416 12/19/2024 0519   HCO3 21.9 12/19/2024 0519   TCO2 23 12/19/2024 0519   ACIDBASEDEF 2.0 12/19/2024 0519   O2SAT 99  12/19/2024 0519   CBG (last 3)  Recent Labs    12/18/24 0330 12/18/24 0758 12/18/24 1123  GLUCAP 114* 110* 109*    Assessment/Plan:  S/P AVR/MVR with pericardial valves and Insertion of Impella 5.5 for postop support 11/30/24. S/P right thoracentesis 12/03/24 complicated by massive right hemothorax due to intercostal artery laceration embolized by IR.  S/P Ex lap, 75 cm ileal resection and wound vac by GS for ischemic distal ileum 12/04/24. S/P re-exploration abdomen and right colectomy without anastomosis for ischemia 12/06/24. S/P re-exploration abdomen, limited distal small bowel resection and end ileostomy 12/08/24. S/P right VATS for decortication and drainage of retained hemothorax 12/10/24. S/P Percutaneous trach by CCM on 12/15/24 S/P removal of Impella 5.5 on 12/16/24.  He has been hemodynamically stable in NSR on IV amio, heparin  for anticoagulation due to AF, DVT prophylaxis and AVR/MVR with pericardial valves. Eventually will probably transition to Eliquis instead of Coumadin  due to gut issues, amio. Co-ox 86%.  VDRF: weaning per CCM.   Renal function normal. Will need to watch for dehydration and electrolyte imbalance with significant ileostomy output.  Leukocytosis resolved on Maxipime  ( completed) and micafungin  for E. Cloacae and candida in BAL.   GI/Nutrition: Tolerating TF at 30 and TNA.   PT.    LOS: 26 days    Alejandro Lopez K  Gibbs Naugle 12/19/2024

## 2024-12-19 NOTE — Progress Notes (Addendum)
 "  Trauma/Critical Care Follow Up Note  Subjective:    Overnight Issues:  AF and HDS Ostomy with 941ml/24hrs Had some mucus from this rectum Denies complaints   Objective:  Vital signs for last 24 hours: Temp:  [97.8 F (36.6 C)-99.3 F (37.4 C)] 98.7 F (37.1 C) (12/27 0718) Pulse Rate:  [80-111] 94 (12/27 0700) Resp:  [17-39] 23 (12/27 0700) BP: (104-167)/(61-102) 118/89 (12/27 0700) SpO2:  [97 %-100 %] 100 % (12/27 0700) FiO2 (%):  [40 %] 40 % (12/27 0718) Weight:  [72.1 kg] 72.1 kg (12/27 0500)  Intake/Output from previous day: 12/26 0701 - 12/27 0700 In: 3172.7 [I.V.:2237.7; NG/GT:630; IV Piggyback:305] Out: 4080 [Urine:3050; Drains:60; Stool:950; Chest Tube:20]  Intake/Output this shift: No intake/output data recorded.  Vent settings for last 24 hours: Vent Mode: PRVC FiO2 (%):  [40 %] 40 % Set Rate:  [20 bmp] 20 bmp Vt Set:  [580 mL] 580 mL PEEP:  [5 cmH20] 5 cmH20 Plateau Pressure:  [18 cmH20-23 cmH20] 18 cmH20  Physical Exam:  Gen: comfortable, no distress Neuro: follows commands HEENT: PERRL Neck: supple CV: RRR Pulm: unlabored breathing on trach Abd: soft, NT, midline vac in place , ostomy productive ( , tan and thin) and mucoid stool from rectum GU: urine clear and yellow, +spontaneous voids Extr: wwp, no edema  Results for orders placed or performed during the hospital encounter of 11/22/24 (from the past 24 hours)  Basic metabolic panel with GFR     Status: Abnormal   Collection Time: 12/18/24  8:24 AM  Result Value Ref Range   Sodium 142 135 - 145 mmol/L   Potassium 4.0 3.5 - 5.1 mmol/L   Chloride 113 (H) 98 - 111 mmol/L   CO2 23 22 - 32 mmol/L   Glucose, Bld 109 (H) 70 - 99 mg/dL   BUN 18 6 - 20 mg/dL   Creatinine, Ser 9.53 (L) 0.61 - 1.24 mg/dL   Calcium  9.0 8.9 - 10.3 mg/dL   GFR, Estimated >39 >39 mL/min   Anion gap 6 5 - 15  Glucose, capillary     Status: Abnormal   Collection Time: 12/18/24 11:23 AM  Result Value Ref Range    Glucose-Capillary 109 (H) 70 - 99 mg/dL  Heparin  level (unfractionated)     Status: Abnormal   Collection Time: 12/18/24  9:22 PM  Result Value Ref Range   Heparin  Unfractionated <0.10 (L) 0.30 - 0.70 IU/mL  Cooxemetry Panel (carboxy, met, total hgb, O2 sat)     Status: Abnormal   Collection Time: 12/19/24  5:08 AM  Result Value Ref Range   Total hemoglobin 7.9 (L) 12.0 - 16.0 g/dL   O2 Saturation 14.0 %   Carboxyhemoglobin 1.9 (H) 0.5 - 1.5 %   Methemoglobin <0.7 0.0 - 1.5 %  Magnesium      Status: None   Collection Time: 12/19/24  5:08 AM  Result Value Ref Range   Magnesium  1.7 1.7 - 2.4 mg/dL  CBC     Status: Abnormal   Collection Time: 12/19/24  5:08 AM  Result Value Ref Range   WBC 10.0 4.0 - 10.5 K/uL   RBC 2.65 (L) 4.22 - 5.81 MIL/uL   Hemoglobin 7.9 (L) 13.0 - 17.0 g/dL   HCT 75.8 (L) 60.9 - 47.9 %   MCV 90.9 80.0 - 100.0 fL   MCH 29.8 26.0 - 34.0 pg   MCHC 32.8 30.0 - 36.0 g/dL   RDW 81.8 (H) 88.4 - 84.4 %   Platelets 529 (  H) 150 - 400 K/uL   nRBC 0.3 (H) 0.0 - 0.2 %  Heparin  level (unfractionated)     Status: Abnormal   Collection Time: 12/19/24  5:08 AM  Result Value Ref Range   Heparin  Unfractionated <0.10 (L) 0.30 - 0.70 IU/mL  Basic metabolic panel     Status: Abnormal   Collection Time: 12/19/24  5:08 AM  Result Value Ref Range   Sodium 139 135 - 145 mmol/L   Potassium 3.6 3.5 - 5.1 mmol/L   Chloride 109 98 - 111 mmol/L   CO2 23 22 - 32 mmol/L   Glucose, Bld 115 (H) 70 - 99 mg/dL   BUN 17 6 - 20 mg/dL   Creatinine, Ser 9.56 (L) 0.61 - 1.24 mg/dL   Calcium  8.7 (L) 8.9 - 10.3 mg/dL   GFR, Estimated >39 >39 mL/min   Anion gap 7 5 - 15  I-STAT 7, (LYTES, BLD GAS, ICA, H+H)     Status: Abnormal   Collection Time: 12/19/24  5:19 AM  Result Value Ref Range   pH, Arterial 7.416 7.35 - 7.45   pCO2 arterial 34.2 32 - 48 mmHg   pO2, Arterial 142 (H) 83 - 108 mmHg   Bicarbonate 21.9 20.0 - 28.0 mmol/L   TCO2 23 22 - 32 mmol/L   O2 Saturation 99 %   Acid-base  deficit 2.0 0.0 - 2.0 mmol/L   Sodium 143 135 - 145 mmol/L   Potassium 3.5 3.5 - 5.1 mmol/L   Calcium , Ion 1.29 1.15 - 1.40 mmol/L   HCT 27.0 (L) 39.0 - 52.0 %   Hemoglobin 9.2 (L) 13.0 - 17.0 g/dL   Sample type ARTERIAL     Assessment & Plan:  LOS: 26 days   Additional comments:I reviewed the patient's new clinical lab test results.   and I reviewed the patients new imaging test results.    43 y/o M S/P AVR, MCR, Impella placement by Dr. Lucas 12/8   S/P right thoracentesis 12/11 with hemorrhage from intercostal -chest tube placed by TCTS.  S/p emergent IR embolizaton  R intercostal artery by Dr. Philip.    S/P VATS 12/18    Pneumatosis of the pelvic small bowel and R colon with portal venous gas on CT  POD 15/13/11 status post ex lap with SBR & VAC placement, repeat laparotomy with patchy small bowel ischemia, end ileostomy/ab closure - WBC 10 and stable.  - ostomy output 950 mL (goal 900-1,200 mL daily) - woc following - vac changes twice weekly - Okay to advance TF to goal, wean TPN - Speech consulted for swallow eval, will appreciate recs - Impella removal 12/24   FEN: NPO, IVF per primary, TPN, TF as above ID: cefepime , micafungin ; resp cx 12/18 with few C albican, BCx 12/17 neg, BAL 12/23 rare yeast VTE: heparin  gtt Foley: in place Dispo: ICU  Cordella Idler, MD [facility] Please see Amion for pager number during day hours 7:00am-4:30pm       12/19/2024  *Care during the described time interval was provided by me. I have reviewed this patient's available data, including medical history, events of note, physical examination and test results as part of my evaluation.    "

## 2024-12-20 ENCOUNTER — Inpatient Hospital Stay (HOSPITAL_COMMUNITY)

## 2024-12-20 DIAGNOSIS — I5021 Acute systolic (congestive) heart failure: Secondary | ICD-10-CM

## 2024-12-20 LAB — HEPARIN LEVEL (UNFRACTIONATED)
Heparin Unfractionated: <0.1 [IU]/mL — ABNORMAL LOW (ref 0.30–0.70)
Heparin Unfractionated: <0.1 [IU]/mL — ABNORMAL LOW (ref 0.30–0.70)

## 2024-12-20 LAB — COOXEMETRY PANEL
Carboxyhemoglobin: 1.9 % — ABNORMAL HIGH (ref 0.5–1.5)
Methemoglobin: <0.7 % (ref 0.0–1.5)
O2 Saturation: 82.8 %
Total hemoglobin: 8 g/dL — ABNORMAL LOW (ref 12.0–16.0)

## 2024-12-20 LAB — HEPATIC FUNCTION PANEL
ALT: 270 U/L — ABNORMAL HIGH (ref 0–44)
AST: 161 U/L — ABNORMAL HIGH (ref 15–41)
Albumin: 2.4 g/dL — ABNORMAL LOW (ref 3.5–5.0)
Alkaline Phosphatase: 321 U/L — ABNORMAL HIGH (ref 38–126)
Bilirubin, Direct: 1 mg/dL — ABNORMAL HIGH (ref 0.0–0.2)
Indirect Bilirubin: 0.4 mg/dL (ref 0.3–0.9)
Total Bilirubin: 1.4 mg/dL — ABNORMAL HIGH (ref 0.0–1.2)
Total Protein: 6.8 g/dL (ref 6.5–8.1)

## 2024-12-20 LAB — BASIC METABOLIC PANEL WITH GFR
Anion gap: 10 (ref 5–15)
BUN: 16 mg/dL (ref 6–20)
CO2: 22 mmol/L (ref 22–32)
Calcium: 9.2 mg/dL (ref 8.9–10.3)
Chloride: 107 mmol/L (ref 98–111)
Creatinine, Ser: 0.42 mg/dL — ABNORMAL LOW (ref 0.61–1.24)
GFR, Estimated: >60 mL/min
Glucose, Bld: 96 mg/dL (ref 70–99)
Potassium: 4.7 mmol/L (ref 3.5–5.1)
Sodium: 139 mmol/L (ref 135–145)

## 2024-12-20 LAB — CBC
HCT: 24.3 % — ABNORMAL LOW (ref 39.0–52.0)
Hemoglobin: 7.9 g/dL — ABNORMAL LOW (ref 13.0–17.0)
MCH: 29.8 pg (ref 26.0–34.0)
MCHC: 32.5 g/dL (ref 30.0–36.0)
MCV: 91.7 fL (ref 80.0–100.0)
Platelets: 593 K/uL — ABNORMAL HIGH (ref 150–400)
RBC: 2.65 MIL/uL — ABNORMAL LOW (ref 4.22–5.81)
RDW: 18 % — ABNORMAL HIGH (ref 11.5–15.5)
WBC: 12 K/uL — ABNORMAL HIGH (ref 4.0–10.5)
nRBC: 0.3 % — ABNORMAL HIGH (ref 0.0–0.2)

## 2024-12-20 LAB — ECHOCARDIOGRAM LIMITED
Area-P 1/2: 2.99 cm2
Calc EF: 48.4 %
Height: 70 in
S' Lateral: 4.7 cm
Single Plane A2C EF: 55.1 %
Single Plane A4C EF: 42.7 %
Weight: 2543.23 [oz_av]

## 2024-12-20 LAB — PHOSPHORUS: Phosphorus: 3.6 mg/dL (ref 2.5–4.6)

## 2024-12-20 LAB — MAGNESIUM: Magnesium: 1.8 mg/dL (ref 1.7–2.4)

## 2024-12-20 MED ORDER — CALCIUM POLYCARBOPHIL 625 MG PO TABS
625.0000 mg | ORAL_TABLET | Freq: Every day | ORAL | Status: DC
  Administered 2024-12-20 – 2024-12-28 (×9): 625 mg
  Filled 2024-12-20 (×2): qty 1

## 2024-12-20 MED ORDER — FREE WATER
100.0000 mL | Status: DC
  Administered 2024-12-20 – 2024-12-21 (×8): 100 mL

## 2024-12-20 MED ORDER — MAGNESIUM SULFATE 2 GM/50ML IV SOLN
2.0000 g | Freq: Once | INTRAVENOUS | Status: AC
  Administered 2024-12-20: 2 g via INTRAVENOUS
  Filled 2024-12-20: qty 50

## 2024-12-20 MED ORDER — DIGOXIN 125 MCG PO TABS
0.1250 mg | ORAL_TABLET | Freq: Every day | ORAL | Status: DC
  Administered 2024-12-20 – 2024-12-24 (×5): 0.125 mg
  Filled 2024-12-20: qty 1

## 2024-12-20 MED ORDER — STERILE WATER FOR INJECTION IV SOLN
INTRAVENOUS | Status: AC
  Filled 2024-12-20: qty 512

## 2024-12-20 MED ORDER — AMIODARONE HCL 200 MG PO TABS
200.0000 mg | ORAL_TABLET | Freq: Two times a day (BID) | ORAL | Status: DC
  Administered 2024-12-20 – 2024-12-28 (×17): 200 mg
  Filled 2024-12-20 (×3): qty 1

## 2024-12-20 MED ORDER — SPIRONOLACTONE 25 MG PO TABS
25.0000 mg | ORAL_TABLET | Freq: Every day | ORAL | Status: DC
  Administered 2024-12-20 – 2024-12-27 (×8): 25 mg
  Filled 2024-12-20: qty 1

## 2024-12-20 MED ORDER — LOSARTAN POTASSIUM 25 MG PO TABS
12.5000 mg | ORAL_TABLET | Freq: Every day | ORAL | Status: DC
  Administered 2024-12-20: 12.5 mg
  Filled 2024-12-20: qty 1

## 2024-12-20 NOTE — Progress Notes (Signed)
 Pt walked while on vent around unit at this time w/o complications

## 2024-12-20 NOTE — Progress Notes (Signed)
 Pt in chair at this time. CPT will resume pt back in bed

## 2024-12-20 NOTE — Progress Notes (Addendum)
 Patient and patient's wife disconnected the patient from the ventilator and attempted to give the patient gatorade while patient was on the ventilator during shift change  Nursing staff came in upon hearing alarms, reconnected ventilator, Disposed of gatorade, Reeducated wife and patient about health and safety issues regarding the situation  Patient refuses education Patient wife started cursing and walking around the room

## 2024-12-20 NOTE — Progress Notes (Signed)
 PHARMACY - TOTAL PARENTERAL NUTRITION CONSULT NOTE   Indication: massive bowel resection   Patient Measurements: Height: 5' 10 (177.8 cm) Weight: 72.1 kg (158 lb 15.2 oz) IBW/kg (Calculated) : 73 TPN AdjBW (KG): 73.4 Body mass index is 22.81 kg/m. Usual Weight: weight 79kg on admission, unclear baseline.   Assessment:  43 yo M s/p aortic and mitral valve replacement with Impella on 12/8. Post-op complicated by persistent HgB drop with abdominal pain, found to have hemorrhage from R intercostal artery s/p chest tube and pneumatosis and concern for bowel ischemia. Patient went to OR on 12/12 for ex-lap found to have ischemia of distal ileum over 75cm without perforation. Patient was left in discontinuity and continued to require 3 vasopressors post-op, concerning for ongoing ischemia. Patient with diet throughout admission however noted nausea and abdominal pain during peri-op time period. On 12/21, tube feeds attempte but patient with vomiting so held again. Patient's EF is ~25%. Pharmacy consulted to manage TPN.   Impella removed 12/25.    Glucose / Insulin : no hx DM, A1c 6.1%, BG < 120, off insulin  Electrolytes: unable to get labs 12/28 added on despite calling multiple times - Mg 1.8 (Received 2g) 12/27 labs Na 139 (none in TPN), K3.5 (gave 40 meq per tube), iCa 1.29/CoCa 10.1, , others wnl Renal: SCr 0.4 (BL SCr ~ 0.7), BUN wnl 12/15 Lasix  60 mg IV x 2  12/16-12/19 Lasix  80 mg IV x BID 12/20 Lasix  80 mg IV x1 12/26 Lasix  40mg  IV x1 Hepatic: alk phos 321 up, AST/ALT 161/270 stable, tbili 1.4 down, albumin  2.4, TG 232 Intake / Output; MIVF: UOP 1.2 ml/kg/hr, drains 0 ml, stool , Net IO Since Admission: -17,655.29 mL [12/20/24 0711]  GI Imaging: 12/11 CT: pneumatosis of small bowel with gas, concerning for bowel ischemia  12/12 KUB: Diffuse gaseous distention of colon evident, similar to prior. 12/17 CT: Status post right colectomy with right periumbilical ileostomy,  Radiopaque material layering within the gallbladder, Moderate soft tissue edema of the abdomen and pelvis.  12/22 KUB: Nonobstructive bowel gas pattern. 12/25 KUB: mild SB Dilation, Nonobstructive bowel gas pattern GI Surgeries / Procedures:  12/12 ex-lap, distal ileum with necrosis without perforation, 75cm small bowel resection, left in discontinuity, VAC placed  12/14 ex lap, terminal ileum and ascending colon had patchy necrosis, right colectomy, abdomen left open, placement of wound vac,  12/16 dusky at end of small bowel, small bowel resection, end ileostomy, closure  12/19 Cortrak placed 12/21 Cortrak removed, NGT placed 12/25 patient pulled NGT, cortrak placed  Central access: PICC placed 12/04/24> removed 12/27, CVC 12/21  TPN start date: 12/04/24  Nutritional Goals:  Goal concentrated TPN rate is 75 mL/hr, provides 133 g protein, 2257 kcals   RD Estimated Needs Total Energy Estimated Needs: 2200-2400kcals Total Protein Estimated Needs: 125-150 g Total Fluid Estimated Needs: 1.8 L  Current Nutrition:  TPN + NPO  12/20 CLD, TF 30 ml/hr- ostomy not working yet >> emesis > Hold TF  12/21 NGT with dark thick, bilious effluent 12/22 thin brown stool from ostomy  12/23 propofol  at 26 ml/hr, Remove lipids from TPN  12/24 TF @ 47ml/hr, no titration for at least 24hr 12/25 TF @ 26ml/hr>> stopped at 1900; propofol  off and plan to stay off  12/26 TF off > restart at 37ml/hr (goal 60 ml/hr) 12/27 TF at 40 ml/hr 12/28 TF at 40 ml/hr with gas pain, lactose intolerant, no good alternative formula, to continue at 35ml/hr   Plan:  Continue half concentrated  TPN at 40 ml/hr, provides 77 g protein, 1226 kcals, meeting 100% estimated needs with TF) Electrolytes in TPN: Na 0 mEq/L, K 50 mEq/L, Ca 3 mEq/L, Mg 15 mEq/L (max), Phos 14 mmol/L, max acetate  Give mag IV 2g  Remove standard MVI and trace elements to TPN, give per tube Monitor TPN labs on Mon/Thurs and PRN F/u tube feed tolerance  and titration, stop TPN as able     Thank you for allowing pharmacy to be a part of this patients care.  Jinnie Door, PharmD, BCPS, BCCP Clinical Pharmacist  Please check AMION for all [facility] phone numbers After 10:00 PM, call Main Pharmacy [phone]

## 2024-12-20 NOTE — Progress Notes (Signed)
 Patient ID: Alejandro Lopez, male   DOB: 05/06/1981, 43 y.o.   MRN: [id number]     Advanced Heart Failure Rounding Note  Cardiologist: None  Chief Complaint: Valvular Heart Disease & HFrEF Patient Profile  43 y.o. male w/ h/o heavy alcohol use (at least 5 drinks per evening) and h/o asthma admitted w/ acute systolic heart failure. Strong family history of cardiac disease: mother and maternal grandmother had heart failure and father with CAD. Admitted with acute HFrEF/cardiogenic shock. Echo w/ severe AR, MR and LV dysfunction.    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  12/9 VT/VF ? vagal episode. Extubated 12/11: S/p R thoracentesis with resultant hemothorax.  Chest tube placed. S/p R intercostal artery coil (IR). S/p exp lap for ischemic bowel resection, bowel left in discontinuity 12/15: Back to OR for ischemic R colon and mesenteric ischemia. S/p exp/lap, wound vac exchange, R colectomy without anastomosis.  12/16 Back to OR for small bowel resection and ileostomy  12/18 OR for VATS/hemothorax washout, pericardial window was not done.  12/19 extubated 12/21 Reintubated. Ileus 12/23 S/p Trach. Fever post procedure, Tmax 101. Abx switched to cefepime    12/24 Impella removed 12/25: DBA stopped. Diuretics held d/t low CVPs  Subjective:    Off pressors/inotropes.  I/Os net negative, weight coming down. Co-ox 83%.   Has been walking in hall with vent.  Getting TFs at this point as well as TPN.   Micafungin  completes on 12/29.     Remains in NSR   Objective:   Weight Range: 72.1 kg Body mass index is 22.81 kg/m.   Vital Signs:   Temp:  [98.7 F (37.1 C)-100 F (37.8 C)] 99.1 F (37.3 C) (12/28 0733) Pulse Rate:  [85-110] 88 (12/28 0825) Resp:  [2-37] 25 (12/28 0825) BP: (103-150)/(70-101) 103/70 (12/28 0825) SpO2:  [97 %-100 %] 100 % (12/28 0800) FiO2 (%):  [40 %] 40 % (12/28 0825) Last BM Date : 12/19/24  Weight change: Filed  Weights   12/17/24 0600 12/18/24 0500 12/19/24 0500  Weight: 77.8 kg 75.3 kg 72.1 kg    Intake/Output:   Intake/Output Summary (Last 24 hours) at 12/20/2024 9071 Last data filed at 12/20/2024 9166 Gross per 24 hour  Intake 2529.21 ml  Output 4860 ml  Net -2330.79 ml     Physical Exam   General: NAD Neck: Trach present, unable to assess JVD with lines Lungs: Decreased at bases.  CV: Nondisplaced PMI.  Heart regular S1/S2, no S3/S4, no murmur.  No peripheral edema.   Abdomen: Soft, nontender, no hepatosplenomegaly, no distention.  Skin: Intact without lesions or rashes.  Neurologic: Alert and oriented x 3.  Psych: Normal affect. Extremities: No clubbing or cyanosis.  HEENT: Normal.   Telemetry   NSR, 90s. Personally reviewed  Labs    CBC Recent Labs    12/19/24 0508 12/19/24 0519 12/20/24 0440  WBC 10.0  --  12.0*  HGB 7.9* 9.2* 7.9*  HCT 24.1* 27.0* 24.3*  MCV 90.9  --  91.7  PLT 529*  --  593*   Basic Metabolic Panel Recent Labs    87/73/74 0824 12/19/24 0508 12/19/24 0519 12/20/24 0440  NA 142 139 143  --   K 4.0 3.6 3.5  --   CL 113* 109  --   --   CO2 23 23  --   --   GLUCOSE 109* 115*  --   --   BUN 18 17  --   --  CREATININE 0.46* 0.43*  --   --   CALCIUM  9.0 8.7*  --   --   MG  --  1.7  --  1.8   Liver Function Tests Recent Labs    12/20/24 0440  AST 161*  ALT 270*  ALKPHOS 321*  BILITOT 1.4*  PROT 6.8  ALBUMIN  2.4*   No results for input(s): LIPASE, AMYLASE in the last 72 hours. Cardiac Enzymes No results for input(s): CKTOTAL, CKMB, CKMBINDEX, TROPONINI in the last 72 hours.  BNP: BNP (last 3 results) No results for input(s): BNP in the last 8760 hours.  ProBNP (last 3 results) Recent Labs    11/22/24 1958  PROBNP 3,354.0*     D-Dimer No results for input(s): DDIMER in the last 72 hours.  Hemoglobin A1C No results for input(s): HGBA1C in the last 72 hours. Fasting Lipid Panel No results for  input(s): CHOL, HDL, LDLCALC, TRIG, CHOLHDL, LDLDIRECT in the last 72 hours.   Thyroid Function Tests No results for input(s): TSH, T4TOTAL, T3FREE, THYROIDAB in the last 72 hours.  Invalid input(s): FREET3  Other results:   Imaging    DG CHEST PORT 1 VIEW Result Date: 12/04/2024 CLINICAL DATA:  Ventilator dependence. EXAM: PORTABLE CHEST 1 VIEW COMPARISON:  12/03/2024 FINDINGS: Endotracheal tube tip is approximately 3.8 cm above the base of the carina. The NG tube passes into the stomach although the distal tip position is not included on the film. Right-sided Impella device is stable in position. A left IJ pulmonary artery catheter tip overlies expected location of the interlobar pulmonary artery. Right pleural drain again noted with persistent right pleural fluid. Basilar collapse/consolidation noted, IMPRESSION: 1. No substantial interval change. 2. Persistent right pleural effusion with right base collapse/consolidation. 3. Support apparatus as above. Electronically Signed   By: Camellia Candle M.D.   On: 12/04/2024 07:15   DG Abd 1 View Result Date: 12/04/2024 CLINICAL DATA:   Routine adult health maintenance Best images obtainable due to patient's condition EXAM: ABDOMEN - 1 VIEW COMPARISON:  12/04/2024 FINDINGS: NG tube tip is in the distal stomach. Diffuse gaseous distention of colon evident, similar to prior. IMPRESSION: NG tube tip is in the distal stomach. Electronically Signed   By: Camellia Candle M.D.   On: 12/04/2024 07:11   CT Angio Chest/Abd/Pel for Dissection W and/or W/WO Addendum Date: 12/03/2024 ADDENDUM #1 ADDENDUM: ---------------------------------------------------- These results were called to Dr. Daniel  at 10:15 pm on 12/03/2024. Electronically signed by: Franky Crease MD 12/03/2024 10:23 PM EST RP Workstation: [id number]   Result Date: 12/03/2024 ORIGINAL REPORT EXAM: CT CHEST, ABDOMEN AND PELVIS WITH AND WITHOUT CONTRAST 12/03/2024 09:52:07 PM  TECHNIQUE: CT of the chest, abdomen and pelvis was performed with and without the administration of intravenous contrast. Multiplanar reformatted images are provided for review. Automated exposure control, iterative reconstruction, and/or weight based adjustment of the mA/kV was utilized to reduce the radiation dose to as low as reasonably achievable. COMPARISON: None available. CLINICAL HISTORY: Acute aortic syndrome (AAS) suspected; Evaluate for intercostal bleeding or other sources after thoracentesis; also evaluate bowel. FINDINGS: CHEST: MEDIASTINUM AND LYMPH NODES: Mild cardiomegaly. Impella device tip in the left ventricle. Swan-ganz catheter tip in the central right pulmonary artery. No evidence of aortic aneurysm or dissection. The central airways are clear. No mediastinal, hilar or axillary lymphadenopathy. LUNGS AND PLEURA: Right chest tube in place. Moderate to large right pleural effusion with compressive atelectasis in the right middle lobe and right lower lobe. There appears to be active  extravasation of contrast posteriorly between the right 9th and 10th ribs and likely into the pleural space, possibly related to prior thoracentesis. The pleural fluid appears complex on the right, likely hemothorax. Small left pleural effusion with compressive atelectasis in the left lower lobe. Tiny right pneumothorax. Biapical subpleural blebs. No evidence of pulmonary embolus. ABDOMEN AND PELVIS: LIVER: Gas is seen branching peripherally in the liver compatible with portal venous gas. GALLBLADDER AND BILE DUCTS: Gallbladder is unremarkable. No biliary ductal dilatation. SPLEEN: No acute abnormality. PANCREAS: No acute abnormality. ADRENAL GLANDS: No acute abnormality. KIDNEYS, URETERS AND BLADDER: No stones in the kidneys or ureters. No hydronephrosis. No perinephric or periureteral stranding. Urinary bladder is unremarkable. GI AND BOWEL: Stomach and small bowel are decompressed. Diffuse gaseous distention of the  colon suggests ileus. Moderate stool burden in the rectosigmoid colon. Pneumatosis noted in small bowel loops in the lower pelvis with air in the mesenteric veins feeding these small bowel loops. Pneumatosis also noted in the right colon wall. REPRODUCTIVE ORGANS: No acute abnormality. PERITONEUM AND RETROPERITONEUM: No ascites. No free air. VASCULATURE: Aorta is normal in caliber. ABDOMINAL AND PELVIS LYMPH NODES: No lymphadenopathy. BONES AND SOFT TISSUES: No acute osseous abnormality. No focal soft tissue abnormality. IMPRESSION: 1. Active contrast extravasation between the right 9th and 10th ribs with likely extension into the pleural space, suspicious for ongoing intercostal bleeding likely related to recent thoracentesis. 2. Moderate to large right pleural effusion compatible with hemothorax with compressive atelectasis in the right middle and lower lobes; right chest tube present; tiny right pneumothorax. 3. No evidence of aortic aneurysm, aortic dissection, or pulmonary embolus. 4. Pneumatosis involving small bowel loops in the lower pelvis and the right colon with mesenteric venous gas and portal venous gas, concerning for bowel ischemia. Recommend surgical consultation. 5. Small left pleural effusion with compressive atelectasis in the left lower lobe. 6. . Attempts are being made to contact the ordering physician with the results. An addendum will be made at that time. Electronically signed by: Franky Crease MD 12/03/2024 10:09 PM EST RP Workstation: [id number]   DG Chest Port 1 View Result Date: 12/03/2024 EXAM: 1 VIEW(S) XRAY OF THE CHEST 12/03/2024 07:57:00 PM COMPARISON: Portable chest today at 7:02 pm. CLINICAL HISTORY: Encounter for chest tube placement. FINDINGS: LINES, TUBES AND DEVICES: A pigtail chest tube has been inserted on the right through the 6th intercostal space, with the pigtail in the lateral upper right thorax. Left IJ Swan-Ganz line again terminates in the distal right pulmonary  artery. Stable positioning of right subclavian Impella device. LUNGS AND PLEURA: No pneumothorax. Moderate to large right pleural effusion is still present but decreased in the interval. Small left pleural effusion unchanged. The right mid to lower lung is obscured by pleural fluid. There is patchy consolidation in the left lower lung field. Left mid and both apical lungs are clear. HEART AND MEDIASTINUM: Sternotomy sutures and two cardiac valve replacements are again shown. There is mild cardiomegaly and mild central vascular prominence as before. BONES AND SOFT TISSUES: No acute osseous abnormality. IMPRESSION: 1. Right pigtail chest tube in position with no measurable pneumothorax. 2. Moderate to large right pleural effusion, decreased compared to earlier today. 3. Patchy consolidation in the left lower lung field. 4. Small left pleural effusion, unchanged. 5. Cardiomegaly . Electronically signed by: Francis Quam MD 12/03/2024 08:10 PM EST RP Workstation: [id number]   DG CHEST PORT 1 VIEW Result Date: 12/03/2024 EXAM: 1 VIEW(S) XRAY OF THE CHEST 12/03/2024 07:05:00 PM COMPARISON: None  available. CLINICAL HISTORY: ABLA (acute blood loss anemia). FINDINGS: LINES, TUBES AND DEVICES: Impella device and Swan-Ganz catheter remain in place, unchanged. LUNGS AND PLEURA: Enlarging right pleural effusion, now large with atelectasis throughout the right lung. Vascular congestion and left basilar atelectasis. Low lung volumes. No pneumothorax. HEART AND MEDIASTINUM: No acute abnormality of the cardiac and mediastinal silhouettes. BONES AND SOFT TISSUES: No acute osseous abnormality. IMPRESSION: 1. Enlarging right pleural effusion, now large, with associated right lung atelectasis. 2. Vascular congestion with left basilar atelectasis. 3. Low lung volumes. Electronically signed by: Franky Crease MD 12/03/2024 07:10 PM EST RP Workstation: [id number]   DG Chest Port 1 View Result Date: 12/03/2024 CLINICAL DATA:  Pleural  effusion, status post thoracentesis EXAM: PORTABLE CHEST 1 VIEW COMPARISON:  12/03/2024 FINDINGS: Single frontal view of the chest demonstrates stable left internal jugular flow directed central venous catheter, tip overlying the right infrahilar region. Stable mitral and aortic valve prostheses. Cardiac silhouette remains enlarged. Lung volumes are diminished, with bibasilar veiling opacities, right greater than left, consistent with consolidation and effusions. No appreciable change since prior study. No evidence of pneumothorax. IMPRESSION: 1. Persistent bibasilar consolidation and effusions, right greater than left. 2. No evidence of pneumothorax. 3. Stable enlarged cardiac silhouette. Electronically Signed   By: Ozell Daring M.D.   On: 12/03/2024 16:10    Medications:   Scheduled Medications:  amiodarone   200 mg Oral BID   arformoterol   15 mcg Nebulization BID   Chlorhexidine  Gluconate Cloth  6 each Topical Daily   colchicine   0.6 mg Per Tube Daily   digoxin   0.125 mg Oral Daily   famotidine   20 mg Per Tube BID   feeding supplement (PROSource TF20)  60 mL Per Tube Daily   feeding supplement (VITAL 1.5 CAL)  1,000 mL Per Tube Q24H   folic acid   1 mg Per Tube Daily   Gerhardt's butt cream   Topical TID   lidocaine   1 patch Transdermal Q24H   multivitamin with minerals  1 tablet Per Tube Daily   mouth rinse  15 mL Mouth Rinse Q2H   pantoprazole  (PROTONIX ) IV  40 mg Intravenous Daily   QUEtiapine   25 mg Per Tube QHS   revefenacin   175 mcg Nebulization Daily   sodium chloride  flush  3 mL Intravenous Q12H   spironolactone   25 mg Oral Daily   thiamine   100 mg Per Tube Daily    Infusions:  sodium chloride  20 mL/hr at 12/17/24 1412   heparin  1,200 Units/hr (12/20/24 0800)   micafungin  (MYCAMINE ) 100 mg in sodium chloride  0.9 % 100 mL IVPB Stopped (12/19/24 1832)   TPN ADULT (ION) 40 mL/hr at 12/20/24 0800    PRN Medications: sodium chloride , acetaminophen  (TYLENOL ) oral liquid 160  mg/5 mL, albuterol , hydrALAZINE , LORazepam , morphine  injection, ondansetron  (ZOFRAN ) IV, mouth rinse, oxyCODONE , polyethylene glycol, sodium chloride , sodium chloride  flush  Assessment/Plan   1.  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  Valvular Disease  - severe MR posteriorly directed, mod TR. Likely functional.  - severe AI-->CMR - Severe AR/MR  - S/P bioprosthetic AVR/MVR - On heparin  gtt  2. Acute hypoxic/hypercarbic respiratory failure - reintubated 12/21 due to hypercarbia  - suspect mostly respiratory muscle weakness at this point as the main limiting issue.  - s/p Trach 12/23 - weaning per CCM, slow wean.   3. Acute Systolic Heart Failure w/ Biventricular Dysfunction >> caridogenic shock - HS trop not c/w ACS but EKG w/ anteroseptal Qs  - CMRI Severe AR/MR.  LVEF 35%  - Cath- normal cors, RA5, PA 32/17 (24), PVR 2.57 Papi 3. CO 4.9 CI 3.8 - Post-op Echo 12/02/24: EF < 20% with severe LVH (new, ?inflammatory), moderately decreased RV function, stable bioprosthetic MV and AoV.  - Impella removed 12/24  - off DBA. Hemodynamics ok with co-ox 83%. - I/Os net negative, no diuretic yesterday.  Will continue off diuretics today, reassess CVP tomorrow.  - Cor-trak placed, can give po meds via this.  - Continue spironolactone  25 mg daily.  - Start losartan  12.5 mg daily. - Start digoxin  0.125 daily.  - can use IV hydral PRN for SBP > 150   4. Ischemic bowel - S/p exp/lap 12/11. Patchy ischemia of the distal ileum extending over about 75 cm without perforation. Ischemic segment resected.  Colon was distended with gas but viable. Small bowel left in discontinuity.  - Back to OR 12/15 for ischemic R colon and mesenteric ischemia. S/p exp/lap, wound vac exchange, R colectomy without anastomosis.  - Returned to OR 12/16 for ileostomy creation.  - GSU following. Ileostomy working well - Remains on TPN  - Cor-trak placed. Has now start TFs.  Still NPO, needs speech evaluation tomorrow.    5. R hemothorax - Hemothorax s/p right thoracentesis with intercostal artery injury. Massive bleeding requiring multiple products and development of hemorrhagic shock, now improved. S/p IR embolization.  - S/p VATS 12/18 with right chest washout.  - H/H stable  - Chest tube in place  6.  ETOH Abuse - heavy drinker, at least 5 drinks per evening - may be contributing to CM, reduction of intake imperative  - No evidence of withdrawal   7 . DMII  - Hgb A1C 6.1 - On SSI.  8. ID - T max 101 F overnight - BAL w/ enterobacter cloacae and candida albicans. Zosyn  >> meropenem  12/21, vanc off and micafungin  started 12/22 - Cefepime  added 12/23 => now off.  - Micafungin  to stop 12/29.    9. AKI - AKI with shock.  - Resolved  10. VT - Quiescent - Transition amiodarone  to po.   11. Pericardial effusion - There is a moderate effusion without tamponade, noted again by echo 12/16.  Will need to follow over time.  - Limited echo 12/17 with moderate effusion.  - This was not drained at time of VATS/right hemothorax washout on 12/18. Appeared fibrinous. - Repeat limited echo to follow effusion.   12 Atrial flutter - In and out of AFL with controlled rate. Currently NSR - Remains in NSR. Can switch to po amio - Now on heparin . No bleeding.  Discussing DOAC vs warfarin, depends on where meds are absorbed as missing significant portion of bowel.  Pharmacy reviewing.   CRITICAL CARE Performed by: Ezra Shuck  Total critical care time: 35 minutes  Critical care time was exclusive of separately billable procedures and treating other patients.  Critical care was necessary to treat or prevent imminent or life-threatening deterioration.  Critical care was time spent personally by me on the following activities: development of treatment plan with patient and/or surrogate as well as nursing, discussions with consultants, evaluation of patient's response to treatment, examination of patient,  obtaining history from patient or surrogate, ordering and performing treatments and interventions, ordering and review of laboratory studies, ordering and review of radiographic studies, pulse oximetry and re-evaluation of patient's condition.   Ezra Shuck, MD  9:28 AM

## 2024-12-20 NOTE — Progress Notes (Signed)
 4 Days Post-Op Procedures (LRB): REMOVAL, CARDIAC ASSIST DEVICE, IMPELLA (Right) Subjective: No specific complaints this am. Had a stable night. Ambulated down the hall on ventilator twice yesterday morning and afternoon.  Co-ox 82.8  -1831 cc yesterday. ( 2200 ileostomy) Objective: Vital signs in last 24 hours: Temp:  [98.7 F (37.1 C)-100 F (37.8 C)] 99.1 F (37.3 C) (12/28 0733) Pulse Rate:  [85-110] 94 (12/28 0600) Cardiac Rhythm: Normal sinus rhythm (12/28 0000) Resp:  [2-37] 21 (12/28 0600) BP: (107-150)/(76-101) 119/83 (12/28 0600) SpO2:  [97 %-100 %] 100 % (12/28 0600) FiO2 (%):  [40 %] 40 % (12/28 0401)  Hemodynamic parameters for last 24 hours: CVP:  [8 mmHg-33 mmHg] 33 mmHg  Intake/Output from previous day: 12/27 0701 - 12/28 0700 In: 2518.7 [I.V.:1577.4; NG/GT:827.8; IV Piggyback:113.5] Out: 4350 [Urine:2150; Stool:2200] Intake/Output this shift: No intake/output data recorded.  General appearance: alert and cooperative Neurologic: intact Heart: regular rate and rhythm Lungs: coarse BS bilaterally Abdomen: soft, non-tender; bowel sounds normal, ileostomy stoma pink Extremities: extremities normal, atraumatic, no cyanosis or edema Wound: chest and right axillary incisions healing well. Minimal drainage at mid portion of chest incision where suture knot was.  Lab Results: Recent Labs    12/19/24 0508 12/19/24 0519 12/20/24 0440  WBC 10.0  --  12.0*  HGB 7.9* 9.2* 7.9*  HCT 24.1* 27.0* 24.3*  PLT 529*  --  593*   BMET:  Recent Labs    12/18/24 0824 12/19/24 0508 12/19/24 0519  NA 142 139 143  K 4.0 3.6 3.5  CL 113* 109  --   CO2 23 23  --   GLUCOSE 109* 115*  --   BUN 18 17  --   CREATININE 0.46* 0.43*  --   CALCIUM  9.0 8.7*  --     PT/INR: No results for input(s): LABPROT, INR in the last 72 hours. ABG    Component Value Date/Time   PHART 7.416 12/19/2024 0519   HCO3 21.9 12/19/2024 0519   TCO2 23 12/19/2024 0519   ACIDBASEDEF 2.0  12/19/2024 0519   O2SAT 82.8 12/20/2024 0419   CBG (last 3)  Recent Labs    12/18/24 0330 12/18/24 0758 12/18/24 1123  GLUCAP 114* 110* 109*    Assessment/Plan:  S/P AVR/MVR with pericardial valves and Insertion of Impella 5.5 for postop support 11/30/24. S/P right thoracentesis 12/03/24 complicated by massive right hemothorax due to intercostal artery laceration embolized by IR.  S/P Ex lap, 75 cm ileal resection and wound vac by GS for ischemic distal ileum 12/04/24. S/P re-exploration abdomen and right colectomy without anastomosis for ischemia 12/06/24. S/P re-exploration abdomen, limited distal small bowel resection and end ileostomy 12/08/24. S/P right VATS for decortication and drainage of retained hemothorax 12/10/24. S/P Percutaneous trach by CCM on 12/15/24 S/P removal of Impella 5.5 on 12/16/24.  He remains hemodynamically stable in NSR on amio down the tube.   On heparin  anticoagulation for AF, DVT prophylaxis and AVR/MVR with pericardial valves. Pharmacy investigated absorption of Eliquis and it is in distal small bowel and ascending colon which he had removed. Will check other DOACs but may need to use Coumadin  if absorbed more proximally.   VDRF: weaning per CCM. He ambulated down the hall on vent so strength is good and hopefully can wean vent.   Renal function normal: I/O are significantly negative with 2200 cc ileostomy output.   Tmax 100, WBC 12. Maxipime  completed and last day for micafungin  today.  GI/nutrition: Tolerating TF at 40 and  TNA. TF goal is 60.  Continue mobilization.  LOS: 27 days    Alejandro Lopez 12/20/2024

## 2024-12-20 NOTE — Progress Notes (Signed)
 "  NAME:  Alejandro Lopez, MRN:  [id number], DOB:  February 14, 1981, LOS: 27 ADMISSION DATE:  11/22/2024, CONSULTATION DATE:  12/8 REFERRING MD:  Lucas, CHIEF COMPLAINT:  post cardiac surgery critical care services    History of Present Illness:  43 year old male with history of hypertension, alcohol and tobacco abuse, presented to the emergency room on 11/30 with approximately 1 month history of progressive dyspnea accompanied by lower extremity swelling extending up to the level of his scrotum and abdomen. Diagnostic evaluation by echocardiogram showed left ventricular ejection fraction 35 to 40% with grade 3 diastolic dysfunction this was further complicated by severe mitral valve regurgitation and aortic insufficiency because of this he was transferred to [facility] for further evaluation. He underwent cardiac catheterization on 12/4: Right heart hemodynamic parameters showed baseline right atrial pressure 5 mmHg, PA pressure 32/17, pulmonary capillary wedge pressure at 14 estimated Fick 4.9 L/min with cardiac index Fick calculated at 2.59 His PAPi was 3 Left heart cath was negative for coronary artery disease Went to OR 12/8 for MVR and AVR w/ impella insertion. PCCM asked to assist w/ post op care   OR course EBL: 1735 Received  Products: cryo 92ml, FFP 401 Also received DDAVP , 2200 crystalloid, 250ml albumin   Cell saver 1125 Pump time 4hrs Clamp time 2hrs 50 min  Events: multiple defibrillations intra-op  Intra-op ECHO EF estimated 40%  Pertinent  Medical History  Tobacco abuse, alcohol abuse, hypertension  Significant Hospital Events: Including procedures, antibiotic start and stop dates in addition to other pertinent events   11/30 admitted/. ECHO 35 to 40% with grade 3 diastolic dysfunction this was further complicated by severe mitral valve regurgitation and aortic insufficiency 12/4 left and right heart cath 12/8 AVR and MVR w/ bioprosthetic valves. Arrived on icu w/  Impella 5.5 MCS at flow 4 lpm and P 7. Received 2 more PLTs, 2 FFP and 1 cryo in first 6 hrs post op for cont blood loss/oozing. Required NE, epi and milrinone . Good flow on IMPELLA but minimal pulsatility  at P7. Placed on Amio gtt for VT 12/9 received 1 unit PRBC over night for hgb down to 7.5, hgb 8 getting second unit of blood. Still on P7 3.5 lPM. Extubated. 12/11 1L thora, later hemorrhagic shock and hemothorax> IR embolization. Also found to have ischemic bowel and underwent ex-lap with partial resection of ileum. 12/15 plan for ostomy tomorrow, transition epi to levophed   12/16 plan back to OR for ostomy placement  12/17 hemodynamically stable off pressors on Milrinone  0.25 , unable to tolerate SBT, plan for CT chest and heparin  gtt, high fevers>micafungin  12/18 VATS for decortication R chest 12/19 extubated 12/20-12/21 abx to meropenem , aspiration/ agitation/ weakness leading to CO2 retention, RV dysfunction and reintubation, L CVC removed; R CVC placed 12/22: Failed SBT d/t wob 12/23: Bedside tracheostomy  12/24 Impella 5 5 was removed in OR, spiked fever with Tmax 101.1, Precedex  stopped, remained off pressors on dobutamine  at 2.5 12/25 tolerated 1 hr weaning on vent, off DBA 12/26 foley & chest tubes out  Interim History / Subjective:  Walked 3 laps yesterday.  Still wants to drink; no SLPs available that can do PMV trial on vent yet to assess swallow.   Objective    Blood pressure 119/83, pulse 94, temperature 99.1 F (37.3 C), temperature source Oral, resp. rate (!) 21, height 5' 10 (1.778 m), weight 72.1 kg, SpO2 100%. CVP:  [3 mmHg-33 mmHg] 33 mmHg  Vent Mode: PRVC FiO2 (%):  [  40 %] 40 % Set Rate:  [20 bmp] 20 bmp Vt Set:  [580 mL] 580 mL PEEP:  [5 cmH20] 5 cmH20 Pressure Support:  [5 cmH20] 5 cmH20 Plateau Pressure:  [12 cmH20-20 cmH20] 20 cmH20   Intake/Output Summary (Last 24 hours) at 12/20/2024 0654 Last data filed at 12/20/2024 0514 Gross per 24 hour  Intake  2646.56 ml  Output 4350 ml  Net -1703.44 ml   Filed Weights   12/17/24 0600 12/18/24 0500 12/19/24 0500  Weight: 77.8 kg 75.3 kg 72.1 kg       Physical exam: General: chronically ill appearing man lying in bed in NAD HEENT: [place]/AT, eyes anicteric Neck: trach with sutures, mild skin breakdown on lower R flange border Neuro: Awake, alert, writing to communicate, trying to talk. Able to walk with assistance.  Chest: breathing comfortably on PS 12/CPAP 5. No rhonchi. Heart: S1S2, RRR. Incision stable- dressed by TCTS today  Abdomen: soft, NT. Midline incision dressing c/d/I. Watery ostomy output.  Extremities: no edema, hands getting wrinkly  Coox 83% BUN 16 Cr 0.42 AST 161 ALT 270 T bili 1.4   Patient Lines/Drains/Airways Status     Active Line/Drains/Airways     Name Placement date Placement time Site Days   CVC Triple Lumen 12/13/24 Right Internal jugular 12/13/24  1600  -- 7   Negative Pressure Wound Therapy Abdomen Medial 12/10/24  0730  --  10   Ileostomy Standard (end) RLQ 12/08/24  1352  RLQ  12   External Urinary Catheter 12/19/24  1626  --  1   Small Bore Feeding Tube 10 Fr. Left nare Marking at nare/corner of mouth 63 cm 12/18/24  1105  Left nare  2   Tracheostomy Shiley Flexible 6 mm Cuffed 12/15/24  1143  6 mm  5   Wound 11/30/24 1512 Surgical Closed Surgical Incision Chest Other (Comment) 11/30/24  1512  Chest  20   Wound 11/30/24 1512 Surgical Closed Surgical Incision Chest Right 11/30/24  1512  Chest  20   Wound 12/05/24 1000 Pressure Injury Buttocks Mid Deep Tissue Pressure Injury - Purple or maroon localized area of discolored intact skin or blood-filled blister due to damage of underlying soft tissue from pressure and/or shear. 12/05/24  1000  Buttocks  15   Wound 12/05/24 1033  Back Left;Upper 12/05/24  1033  Back  15   Wound 12/08/24 1408 Surgical Closed Surgical Incision Abdomen Other (Comment) 12/08/24  1408  Abdomen  12            Resolved problem  list  AKI Lactic acidosis, resolved Constipation Hyponatremia Right-sided hemothorax status post IR embolization 12/11, status post VATS 12/18 Acute septic encephalopathy, resolved Assessment and Plan  Acute biventricular HFrEF with cardiogenic shock status post Impella 5.5, removed on 12/24 Severe mitral regurgitation and aortic insufficiency status post bioprosthetic mitral valve and aortic valve replacement Nonsustained VT, no more episodes Moderate circumferential pericardial effusion - Doing well off inotropes and mechanical support - Will eventually need GDMT as tolerated-not sure about GI function. - Continue amiodarone  enterally.  Anticoagulation will be difficult.  Eliquis is absorbed in the distal small bowel, most of which has been resected. - Continue heparin  drip; can investigate other DOAC options -Telemetry monitoring  Acute respiratory failure with hypoxia and hypercapnia status post tracheostomy -Trach care per protocol, remove sutures on 12/30 -mepilex under trach - Low tidal volume ventilation - VAP prevention protocol - PAD protocol for sedation -Continue warming efforts-is doing doing well on pressure  support 12 today -SLP consulted for PMV trials; can do this on the vent as needed> should be able to do 12/29 in early afternoon.  Sepsis with septic shock due to recurrent aspiration pneumonia -Enterobacter -Continue micafungin  through 12/29 - Monitor for fevers  Ischemic bowel s/p laparotomy with partial ileum resection end-ileostomy  Postop ileus, improving - Continue tube feeds.  Escalating as tolerated.  Adding dnteral water  given how thirsty he is. Will need fiber and imodium  to slow motility eventually -appreciate general surgery's management -Continue TPN until tolerating tube feeds to goal  Shock liver -Maintain adequate perfusion  Hypertriglyceridemia due to propofol  infusion, now TPN - Wean TPN once meeting needs enterally  H/o alcohol abuse -  Vitamins, recommend total cessation  Anemia of critical illness and previous ABLA from hemothorax, operative blood loss -Transfuse for hemoglobin less than 7 or hemodynamically significant bleeding  Agitation> situational due to prolonged illness Debility -PT, OT, SLP - Continue walking -Anticipate he will need CIR  Wife at bedside today; she was happy to see him walking.  DVT: heparin  gtt  GI: pepcid  Lines:  RIJ CVC   sodium chloride  20 mL/hr at 12/17/24 1412   heparin  1,200 Units/hr (12/20/24 0500)   micafungin  (MYCAMINE ) 100 mg in sodium chloride  0.9 % 100 mL IVPB Stopped (12/19/24 1832)   TPN ADULT (ION) 40 mL/hr at 12/20/24 0500   This patient is critically ill with multiple organ system failure which requires frequent high complexity decision making, assessment, support, evaluation, and titration of therapies. This was completed through the application of advanced monitoring technologies and extensive interpretation of multiple databases. During this encounter critical care time was devoted to patient care services described in this note for 40 minutes.  Alejandro SHAUNNA Gaskins, DO 12/20/2024 5:53 PM [facility]  For contact information, see Amion. If no response to pager, please call PCCM consult pager. After hours, 7PM- 7AM, please call Elink.  "

## 2024-12-20 NOTE — Progress Notes (Signed)
 "  Trauma/Critical Care Follow Up Note  Subjective:    Overnight Issues:   Denies complaints   Objective:  Vital signs for last 24 hours: Temp:  [98.9 F (37.2 C)-100 F (37.8 C)] 99.1 F (37.3 C) (12/28 0733) Pulse Rate:  [88-110] 90 (12/28 0900) Resp:  [2-37] 18 (12/28 0900) BP: (103-150)/(70-101) 115/86 (12/28 0900) SpO2:  [97 %-100 %] 100 % (12/28 0900) FiO2 (%):  [40 %] 40 % (12/28 0825)  Intake/Output from previous day: 12/27 0701 - 12/28 0700 In: 2518.7 [I.V.:1577.4; NG/GT:827.8; IV Piggyback:113.5] Out: 4350 [Urine:2150; Stool:2200]  Intake/Output this shift: Total I/O In: 624.7 [I.V.:259.7; NG/GT:365] Out: 510 [Urine:200; Stool:310]  Vent settings for last 24 hours: Vent Mode: PSV;CPAP FiO2 (%):  [40 %] 40 % Set Rate:  [20 bmp] 20 bmp Vt Set:  [580 mL] 580 mL PEEP:  [5 cmH20] 5 cmH20 Pressure Support:  [5 cmH20-12 cmH20] 12 cmH20 Plateau Pressure:  [14 cmH20-20 cmH20] 20 cmH20  Physical Exam:  Gen: comfortable, no distress Neuro: follows commands HEENT: PERRL, trach in place Neck: supple CV: RRR Pulm: unlabored breathing on trach Abd: soft, NT, midline vac in place , ostomy productive (2200, thin) and mucoid stool from rectum GU: urine clear and yellow, +spontaneous voids Extr: wwp, no edema  Results for orders placed or performed during the hospital encounter of 11/22/24 (from the past 24 hours)  Heparin  level (unfractionated)     Status: Abnormal   Collection Time: 12/19/24  5:08 PM  Result Value Ref Range   Heparin  Unfractionated <0.10 (L) 0.30 - 0.70 IU/mL  Cooxemetry Panel (carboxy, met, total hgb, O2 sat)     Status: Abnormal   Collection Time: 12/20/24  4:19 AM  Result Value Ref Range   Total hemoglobin 8.0 (L) 12.0 - 16.0 g/dL   O2 Saturation 17.1 %   Carboxyhemoglobin 1.9 (H) 0.5 - 1.5 %   Methemoglobin <0.7 0.0 - 1.5 %  Magnesium      Status: None   Collection Time: 12/20/24  4:40 AM  Result Value Ref Range   Magnesium  1.8 1.7 - 2.4  mg/dL  CBC     Status: Abnormal   Collection Time: 12/20/24  4:40 AM  Result Value Ref Range   WBC 12.0 (H) 4.0 - 10.5 K/uL   RBC 2.65 (L) 4.22 - 5.81 MIL/uL   Hemoglobin 7.9 (L) 13.0 - 17.0 g/dL   HCT 75.6 (L) 60.9 - 47.9 %   MCV 91.7 80.0 - 100.0 fL   MCH 29.8 26.0 - 34.0 pg   MCHC 32.5 30.0 - 36.0 g/dL   RDW 81.9 (H) 88.4 - 84.4 %   Platelets 593 (H) 150 - 400 K/uL   nRBC 0.3 (H) 0.0 - 0.2 %  Heparin  level (unfractionated)     Status: Abnormal   Collection Time: 12/20/24  4:40 AM  Result Value Ref Range   Heparin  Unfractionated <0.10 (L) 0.30 - 0.70 IU/mL  Hepatic function panel     Status: Abnormal   Collection Time: 12/20/24  4:40 AM  Result Value Ref Range   Total Protein 6.8 6.5 - 8.1 g/dL   Albumin  2.4 (L) 3.5 - 5.0 g/dL   AST 838 (H) 15 - 41 U/L   ALT 270 (H) 0 - 44 U/L   Alkaline Phosphatase 321 (H) 38 - 126 U/L   Total Bilirubin 1.4 (H) 0.0 - 1.2 mg/dL   Bilirubin, Direct 1.0 (H) 0.0 - 0.2 mg/dL   Indirect Bilirubin 0.4 0.3 - 0.9 mg/dL  Assessment & Plan:  LOS: 27 days   Additional comments:I reviewed the patient's new clinical lab test results.   and I reviewed the patients new imaging test results.    43 y/o M S/P AVR, MCR, Impella placement by Dr. Lucas 12/8   S/P right thoracentesis 12/11 with hemorrhage from intercostal -chest tube placed by TCTS.  S/p emergent IR embolizaton  R intercostal artery by Dr. Philip.    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 Will add fiber supp - woc following - vac changes twice weekly - Okay to advance TF to goal, wean TPN - Speech consulted for swallow eval, following - Impella removal 12/24   FEN: NPO, IVF per primary, TPN, TF as above ID: [email or web address] ,  micafungin ; resp cx 12/18 with few C albican, BCx 12/17 neg, BAL 12/23 rare yeast VTE: heparin  gtt Foley: in place Dispo: ICU  Cordella Idler, MD [facility] Please see Amion for pager number during day hours 7:00am-4:30pm       12/20/2024  *Care during the described time interval was provided by me. I have reviewed this patient's available data, including medical history, events of note, physical examination and test results as part of my evaluation.    "

## 2024-12-20 NOTE — Progress Notes (Signed)
 Echocardiogram 2D Echocardiogram has been performed.  Alejandro Lopez 12/20/2024, 3:39 PM

## 2024-12-20 NOTE — Progress Notes (Signed)
 ANTICOAGULATION CONSULT NOTE  Pharmacy Consult for heparin  Indication: bA/MVR  Allergies[1]  Patient Measurements: Height: 5' 10 (177.8 cm) Weight: 72.1 kg (158 lb 15.2 oz) IBW/kg (Calculated) : 73 Heparin  Dosing Weight: 70 kg   Vital Signs: Temp: 99.1 F (37.3 C) (12/28 0320) Temp Source: Oral (12/28 0320) BP: 119/83 (12/28 0600) Pulse Rate: 94 (12/28 0600)  Labs: Recent Labs    12/18/24 0824 12/18/24 2122 12/19/24 0508 12/19/24 0519 12/19/24 1708 12/20/24 0440  HGB  --   --  7.9* 9.2*  --  7.9*  HCT  --   --  24.1* 27.0*  --  24.3*  PLT  --   --  529*  --   --  593*  HEPARINUNFRC  --    < > <0.10*  --  <0.10* <0.10*  CREATININE 0.46*  --  0.43*  --   --   --    < > = values in this interval not displayed.    Estimated Creatinine Clearance: 118.9 mL/min (A) (by C-G formula based on SCr of 0.43 mg/dL (L)).  Assessment: 44 yo male presents s/p Impella-assisted MVR/AVR 12/8 with brief VT and ongoing ectopy on inotropes.  Postop course complicated by ischemic bowel s/p exlap 12/11 with partial small bowel resection, abdomen left open.  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   Per Dr. Lucas, plan for 3 months of OAC with dual valve replacement. Not on anticoagulation prior to admission.  Pharmacy consulted for heparin  dosing.  Impella 5.5 removed 12/24, heparin  held. Ok to resume 12/26 with low therapeutic goal and slow titrations. **of note pt required ~1200 units/hr for heparin  level ~0.2**  Heparin  level <0.1 is subtherapeutic with heparin  running at 1200 units/hr. Level was drawn from same line heparin  was running through, repeat level returned at < 0.1. Hgb (7.9) and PLTs (593) are stable. Per RN, no report of pauses, issues with the line, or signs of bleeding.    Goal of Therapy:  Heparin  level 0.3-0.5 units/ml Monitor platelets by anticoagulation protocol: Yes   Plan:   Increase heparin  to 1350 units/hr Check 6 hour heparin  level Monitor daily HL, CBC, and for s/sx of bleeding   Thank you for allowing pharmacy to be a part of this patients care.   Nidia Schaffer, PharmD PGY2 Cardiology Pharmacy Resident  Please check AMION for all [facility] Pharmacy phone numbers After 10:00 PM, call Main Pharmacy [phone] 12/20/2024 7:17 AM     [1] No Known Allergies

## 2024-12-20 NOTE — Plan of Care (Signed)
   Problem: Elimination: Goal: Will not experience complications related to urinary retention Outcome: Progressing   Problem: Skin Integrity: Goal: Risk for impaired skin integrity will decrease Outcome: Progressing

## 2024-12-20 NOTE — Progress Notes (Signed)
 RT assisted with ambulating patient around the unit while on the ventilator. RT and RN x2 accompanied pt without complication.

## 2024-12-21 ENCOUNTER — Inpatient Hospital Stay (HOSPITAL_COMMUNITY)

## 2024-12-21 DIAGNOSIS — Z430 Encounter for attention to tracheostomy: Secondary | ICD-10-CM

## 2024-12-21 DIAGNOSIS — I5021 Acute systolic (congestive) heart failure: Secondary | ICD-10-CM | POA: Diagnosis not present

## 2024-12-21 DIAGNOSIS — Z9911 Dependence on respirator [ventilator] status: Secondary | ICD-10-CM

## 2024-12-21 DIAGNOSIS — R5381 Other malaise: Secondary | ICD-10-CM

## 2024-12-21 LAB — CBC
HCT: 25 % — ABNORMAL LOW (ref 39.0–52.0)
Hemoglobin: 8.3 g/dL — ABNORMAL LOW (ref 13.0–17.0)
MCH: 29.6 pg (ref 26.0–34.0)
MCHC: 33.2 g/dL (ref 30.0–36.0)
MCV: 89.3 fL (ref 80.0–100.0)
Platelets: 608 K/uL — ABNORMAL HIGH (ref 150–400)
RBC: 2.8 MIL/uL — ABNORMAL LOW (ref 4.22–5.81)
RDW: 17.6 % — ABNORMAL HIGH (ref 11.5–15.5)
WBC: 14 K/uL — ABNORMAL HIGH (ref 4.0–10.5)
nRBC: 0.2 % (ref 0.0–0.2)

## 2024-12-21 LAB — PHOSPHORUS: Phosphorus: 3.7 mg/dL (ref 2.5–4.6)

## 2024-12-21 LAB — URINALYSIS, ROUTINE W REFLEX MICROSCOPIC
Bacteria, UA: NONE SEEN
Bilirubin Urine: NEGATIVE
Glucose, UA: NEGATIVE mg/dL
Ketones, ur: NEGATIVE mg/dL
Leukocytes,Ua: NEGATIVE
Nitrite: NEGATIVE
Protein, ur: NEGATIVE mg/dL
Specific Gravity, Urine: 1.014 (ref 1.005–1.030)
pH: 5 (ref 5.0–8.0)

## 2024-12-21 LAB — COMPREHENSIVE METABOLIC PANEL WITH GFR
ALT: 259 U/L — ABNORMAL HIGH (ref 0–44)
AST: 136 U/L — ABNORMAL HIGH (ref 15–41)
Albumin: 2.3 g/dL — ABNORMAL LOW (ref 3.5–5.0)
Alkaline Phosphatase: 351 U/L — ABNORMAL HIGH (ref 38–126)
Anion gap: 9 (ref 5–15)
BUN: 15 mg/dL (ref 6–20)
CO2: 23 mmol/L (ref 22–32)
Calcium: 9 mg/dL (ref 8.9–10.3)
Chloride: 104 mmol/L (ref 98–111)
Creatinine, Ser: 0.48 mg/dL — ABNORMAL LOW (ref 0.61–1.24)
GFR, Estimated: >60 mL/min
Glucose, Bld: 111 mg/dL — ABNORMAL HIGH (ref 70–99)
Potassium: 4.7 mmol/L (ref 3.5–5.1)
Sodium: 136 mmol/L (ref 135–145)
Total Bilirubin: 1.4 mg/dL — ABNORMAL HIGH (ref 0.0–1.2)
Total Protein: 7 g/dL (ref 6.5–8.1)

## 2024-12-21 LAB — HEPARIN LEVEL (UNFRACTIONATED)
Heparin Unfractionated: <0.1 [IU]/mL — ABNORMAL LOW (ref 0.30–0.70)
Heparin Unfractionated: 0.13 [IU]/mL — ABNORMAL LOW (ref 0.30–0.70)
Heparin Unfractionated: 0.19 [IU]/mL — ABNORMAL LOW (ref 0.30–0.70)

## 2024-12-21 LAB — POCT I-STAT 7, (LYTES, BLD GAS, ICA,H+H)
Acid-Base Excess: 2 mmol/L (ref 0.0–2.0)
Bicarbonate: 24.7 mmol/L (ref 20.0–28.0)
Calcium, Ion: 1.24 mmol/L (ref 1.15–1.40)
HCT: 35 % — ABNORMAL LOW (ref 39.0–52.0)
Hemoglobin: 11.9 g/dL — ABNORMAL LOW (ref 13.0–17.0)
O2 Saturation: 99 %
Potassium: 4.4 mmol/L (ref 3.5–5.1)
Sodium: 137 mmol/L (ref 135–145)
TCO2: 26 mmol/L (ref 22–32)
pCO2 arterial: 33.5 mmHg (ref 32–48)
pH, Arterial: 7.477 — ABNORMAL HIGH (ref 7.35–7.45)
pO2, Arterial: 145 mmHg — ABNORMAL HIGH (ref 83–108)

## 2024-12-21 LAB — TRIGLYCERIDES: Triglycerides: 116 mg/dL

## 2024-12-21 LAB — COOXEMETRY PANEL
Carboxyhemoglobin: 1.8 % — ABNORMAL HIGH (ref 0.5–1.5)
Methemoglobin: <0.7 % (ref 0.0–1.5)
O2 Saturation: 89.1 %
Total hemoglobin: 7.6 g/dL — ABNORMAL LOW (ref 12.0–16.0)

## 2024-12-21 LAB — MAGNESIUM: Magnesium: 1.7 mg/dL (ref 1.7–2.4)

## 2024-12-21 MED ORDER — COLCHICINE 0.3 MG HALF TABLET
0.3000 mg | ORAL_TABLET | Freq: Every day | ORAL | Status: DC
  Administered 2024-12-21 – 2024-12-22 (×2): 0.3 mg
  Filled 2024-12-21 (×3): qty 1

## 2024-12-21 MED ORDER — FREE WATER
150.0000 mL | Status: DC
  Administered 2024-12-21 – 2024-12-24 (×35): 150 mL

## 2024-12-21 MED ORDER — LOPERAMIDE HCL 1 MG/7.5ML PO SUSP
2.0000 mg | Freq: Four times a day (QID) | ORAL | Status: DC
  Filled 2024-12-21 (×2): qty 15

## 2024-12-21 MED ORDER — MAGNESIUM SULFATE 4 GM/100ML IV SOLN
4.0000 g | Freq: Once | INTRAVENOUS | Status: AC
  Administered 2024-12-21: 4 g via INTRAVENOUS
  Filled 2024-12-21: qty 100

## 2024-12-21 MED ORDER — LOSARTAN POTASSIUM 25 MG PO TABS
25.0000 mg | ORAL_TABLET | Freq: Every day | ORAL | Status: AC
  Administered 2024-12-21 – 2024-12-28 (×8): 25 mg
  Filled 2024-12-21 (×8): qty 1

## 2024-12-21 MED ORDER — BANATROL TF EN LIQD
60.0000 mL | Freq: Three times a day (TID) | ENTERAL | Status: DC
  Administered 2024-12-21 – 2024-12-22 (×4): 60 mL
  Filled 2024-12-21 (×4): qty 60

## 2024-12-21 MED ORDER — LOPERAMIDE HCL 1 MG/7.5ML PO SUSP
4.0000 mg | Freq: Four times a day (QID) | ORAL | Status: AC
  Administered 2024-12-21 – 2024-12-28 (×30): 4 mg
  Filled 2024-12-21 (×32): qty 30

## 2024-12-21 NOTE — Plan of Care (Signed)
 " Problem: Education: Goal: Knowledge of General Education information will improve Description: Including pain rating scale, medication(s)/side effects and non-pharmacologic comfort measures Outcome: Progressing   Problem: Health Behavior/Discharge Planning: Goal: Ability to manage health-related needs will improve Outcome: Progressing   Problem: Clinical Measurements: Goal: Ability to maintain clinical measurements within normal limits will improve Outcome: Progressing Goal: Will remain free from infection Outcome: Progressing Goal: Diagnostic test results will improve Outcome: Progressing Goal: Respiratory complications will improve Outcome: Progressing Goal: Cardiovascular complication will be avoided Outcome: Progressing   Problem: Activity: Goal: Risk for activity intolerance will decrease Outcome: Progressing   Problem: Nutrition: Goal: Adequate nutrition will be maintained Outcome: Progressing   Problem: Coping: Goal: Level of anxiety will decrease Outcome: Progressing   Problem: Elimination: Goal: Will not experience complications related to bowel motility Outcome: Progressing Goal: Will not experience complications related to urinary retention Outcome: Progressing   Problem: Pain Managment: Goal: General experience of comfort will improve and/or be controlled Outcome: Progressing   Problem: Safety: Goal: Ability to remain free from injury will improve Outcome: Progressing   Problem: Skin Integrity: Goal: Risk for impaired skin integrity will decrease Outcome: Progressing   Problem: Education: Goal: Ability to demonstrate management of disease process will improve Outcome: Progressing Goal: Ability to verbalize understanding of medication therapies will improve Outcome: Progressing   Problem: Activity: Goal: Capacity to carry out activities will improve Outcome: Progressing   Problem: Cardiac: Goal: Ability to achieve and maintain adequate  cardiopulmonary perfusion will improve Outcome: Progressing   Problem: Education: Goal: Understanding of CV disease, CV risk reduction, and recovery process will improve Outcome: Progressing   Problem: Activity: Goal: Ability to return to baseline activity level will improve Outcome: Progressing   Problem: Cardiovascular: Goal: Ability to achieve and maintain adequate cardiovascular perfusion will improve Outcome: Progressing Goal: Vascular access site(s) Level 0-1 will be maintained Outcome: Progressing   Problem: Health Behavior/Discharge Planning: Goal: Ability to safely manage health-related needs after discharge will improve Outcome: Progressing   Problem: Education: Goal: Understanding of CV disease, CV risk reduction, and recovery process will improve Outcome: Progressing   Problem: Activity: Goal: Ability to return to baseline activity level will improve Outcome: Progressing   Problem: Cardiovascular: Goal: Ability to achieve and maintain adequate cardiovascular perfusion will improve Outcome: Progressing Goal: Vascular access site(s) Level 0-1 will be maintained Outcome: Progressing   Problem: Health Behavior/Discharge Planning: Goal: Ability to safely manage health-related needs after discharge will improve Outcome: Progressing   Problem: Education: Goal: Will demonstrate proper wound care and an understanding of methods to prevent future damage Outcome: Progressing Goal: Knowledge of disease or condition will improve Outcome: Progressing Goal: Knowledge of the prescribed therapeutic regimen will improve Outcome: Progressing   Problem: Activity: Goal: Risk for activity intolerance will decrease Outcome: Progressing   Problem: Cardiac: Goal: Will achieve and/or maintain hemodynamic stability Outcome: Progressing   Problem: Clinical Measurements: Goal: Postoperative complications will be avoided or minimized Outcome: Progressing   Problem:  Respiratory: Goal: Respiratory status will improve Outcome: Progressing   Problem: Skin Integrity: Goal: Wound healing without signs and symptoms of infection Outcome: Progressing Goal: Risk for impaired skin integrity will decrease Outcome: Progressing   Problem: Urinary Elimination: Goal: Ability to achieve and maintain adequate renal perfusion and functioning will improve Outcome: Progressing   Problem: Cardiac: Goal: Ability to achieve and maintain adequate cardiopulmonary perfusion will improve Outcome: Progressing Goal: Vascular access site(s) Level 0-1 will be maintained Outcome: Progressing   Problem: Fluid Volume: Goal: Ability to  achieve a balanced intake and output will improve Outcome: Progressing   Problem: Physical Regulation: Goal: Complications related to the disease process, condition or treatment will be avoided or minimized Outcome: Progressing   Problem: Respiratory: Goal: Will regain and/or maintain adequate ventilation Outcome: Progressing   "

## 2024-12-21 NOTE — Procedures (Signed)
 Objective Swallowing Evaluation: Type of Study: FEES-Fiberoptic Endoscopic Evaluation of Swallow   Patient Details  Name: Alejandro Lopez MRN: [id number] Date of Birth: 01/31/1981  Today's Date: 12/21/2024 Time: SLP Start Time (ACUTE ONLY): 1351 -SLP Stop Time (ACUTE ONLY): 1430  SLP Time Calculation (min) (ACUTE ONLY): 39 min   Past Medical History:  Past Medical History:  Diagnosis Date   Asthma    Past Surgical History:  Past Surgical History:  Procedure Laterality Date   AORTIC VALVE REPLACEMENT N/A 11/30/2024   Procedure: REPLACEMENT, AORTIC VALVE, OPEN WITH INSPIRIS RESILIA AORTIC VALVE 23mm;  Surgeon: Lucas Dorise POUR, MD;  [facility]: [facility] OR;  Service: Open Heart Surgery;  Laterality: N/A;   BOWEL RESECTION N/A 12/08/2024   Procedure: EXCISION, SMALL INTESTINE;  Surgeon: Ebbie Cough, MD;  Location: [facility] OR;  Service: General;  Laterality: N/A;   COLOSTOMY REVISION Right 12/06/2024   Procedure: COLECTOMY, RIGHT;  Surgeon: Tanda Locus, MD;  Location: [facility] OR;  Service: General;  Laterality: Right;   FRACTURE SURGERY     ILEOSTOMY N/A 12/08/2024   Procedure: CREATION, ILEOSTOMY END;  Surgeon: Ebbie Cough, MD;  Location: [facility] OR;  Service: General;  Laterality: N/A;   INTRAOPERATIVE TRANSESOPHAGEAL ECHOCARDIOGRAM N/A 11/30/2024   Procedure: ECHOCARDIOGRAM, TRANSESOPHAGEAL, INTRAOPERATIVE;  Surgeon: Lucas Dorise POUR, MD;  [facility]: [facility] OR;  Service: Open Heart Surgery;  Laterality: N/A;   IR ANGIOGRAM FOLLOW UP STUDY  12/04/2024   IR ANGIOGRAM SELECTIVE EACH ADDITIONAL VESSEL  12/04/2024   IR ANGIOGRAM VISCERAL SELECTIVE  12/04/2024   IR EMBO ART  VEN HEMORR LYMPH EXTRAV  INC GUIDE ROADMAPPING  12/04/2024   IR US  GUIDE VASC ACCESS RIGHT  12/04/2024   LAPAROTOMY N/A 12/03/2024   Procedure: LAPAROTOMY, EXPLORATORY small bowel resection abthrea therapy;  Surgeon: Sebastian Moles, MD;  Location: [facility] OR;  Service: General;  Laterality: N/A;  possible bowel resection   LAPAROTOMY  N/A 12/06/2024   Procedure: LAPAROTOMY, EXPLORATORY; WOUND VAC CHANGE;  Surgeon: Tanda Locus, MD;  Location: [facility] OR;  Service: General;  Laterality: N/A;   LAPAROTOMY N/A 12/08/2024   Procedure: MACARIO LIVINGS;  Surgeon: Ebbie Cough, MD;  Location: [facility] OR;  Service: General;  Laterality: N/A;   MITRAL VALVE REPLACEMENT N/A 11/30/2024   Procedure: REPLACEMENT, MITRAL VALVE WITH MITRIS RESILIA MITRAL VALVE 27mm;  Surgeon: Lucas Dorise POUR, MD;  Location: [facility] OR;  Service: Open Heart Surgery;  Laterality: N/A;   PLACEMENT OF IMPELLA LEFT VENTRICULAR ASSIST DEVICE N/A 11/30/2024   Procedure: INSERTION, CARDIAC ASSIST DEVICE, IMPELLA;  Surgeon: Lucas Dorise POUR, MD;  [facility];  Service: Open Heart Surgery;  Laterality: N/A;  IMPELLA 5.5 INSERTION   REMOVAL OF IMPELLA LEFT VENTRICULAR ASSIST DEVICE Right 12/16/2024   Procedure: REMOVAL, CARDIAC ASSIST DEVICE, IMPELLA;  Surgeon: Lucas Dorise POUR, MD;  [facility]: [facility] OR;  Service: Open Heart Surgery;  Laterality: Right;   RIGHT HEART CATH AND CORONARY ANGIOGRAPHY N/A 11/27/2024   Procedure: RIGHT HEART CATH AND CORONARY ANGIOGRAPHY;  Surgeon: Zenaida Morene PARAS, MD;  [facility]: [facility] INVASIVE CV LAB;  Service: Cardiovascular;  Laterality: N/A;   TRANSESOPHAGEAL ECHOCARDIOGRAM (CATH LAB) N/A 11/27/2024   Procedure: TRANSESOPHAGEAL ECHOCARDIOGRAM;  Surgeon: Zenaida Morene PARAS, MD;  Location: [facility] INVASIVE CV LAB;  Service: Cardiovascular;  Laterality: N/A;   VIDEO ASSISTED THORACOSCOPY (VATS)/DECORTICATION Right 12/10/2024   Procedure: VIDEO ASSISTED THORACOSCOPY (VATS)/DECORTICATION;  Surgeon: Shyrl Linnie KIDD, MD;  Location: [facility] OR;  Service: Thoracic;  Laterality: Right;   HPI: The pt is a 43 yo male  presenting 11/30 with swelling of LE, groin, and abdomen. Work up revealed acute HFrEF, severe MR, aortic insufficiency, and transaminates. 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 Extubated 12/9. S/p thoracentesis 12/11 with 1000cc's  of blood drained from R pleural space, rapid recollection and pt then s/p R chest tube placed 12/11 with 2.5L bloody output. S/p  coil embolization of right intercostal artery at level of right 10th rib, followed by ex lap with wound vac placement which showed patchy ischemia of ileum extending 75cm. Return to OR 12/14 with R colectomy and 12/16 for ostomy placement. To OR 12/18 for R VATS, washout, and pericardial window. Intubated 12/12-12/19, re-intubated 12/21. Trach 12/23. PMH includes: HTN, alcohol use (5 drinks/day), tobacco use, but is limited by limited access to healthcare.   Subjective: feels comfortable on the valve, happy to be talking with family over the phone    Assessment / Plan / Recommendation     12/21/2024    1:42 PM  Clinical Impressions  Clinical Impression Pt has good timing and airway protection during swallowing with PO trials, with pharyngeal residue that improves throughout the study. He does have reduced secretion management upon initial entry with the scope, also improving across trials and with good prognosis to improve as he can wear his PMV for longer intervals. Recommend Dys 3 (mechanical soft) diet and thin liquids, to be consumed while on TC with PMV in place, which also means with full supervision for now.   Pt has diffuse, clear and frothy secretions throughout his larynx and pharynx upon initial view of the scope. He can cough secretions away from his airway although he has trouble clearing them from his pharynx until more POs are introduced. Anatomically, also note a small amount of bilateral bowing and erythema of his posterior vocal folds, which may be suggestive of where the ETT had been.   Pt initiates a swallow swiftly and maintains an adequate white out period even when drinking rapid, sequential boluses. No evidence of penetration or aspiration with POs. He does have pharyngeal residue primarily within the valleculae and pyriform sinuses, improving  across the study. This is likely related to some mild deconditioning but also initially exacerbated by dry mucosa as he has been NPO.   Pt is likely at a higher risk of aspiration of secretions based on this FEES, although risk could be reduced with increasing access to PMV and POs. Per general surgery PA, okay with any type of POs offered. Would start with mechanical soft diet given presence of pharyngeal residue with POs that would have the potential to mix with secretions. His prognosis for improving swallow function is good with increased opportunity to swallow. Will focus on also increasing independence with PMV in subsequent visits.    SLP Visit Diagnosis Dysphagia, pharyngeal phase (R13.13)  Attention and concentration deficit following --  Frontal lobe and executive function deficit following --  Impact on safety and function --         12/21/2024    1:42 PM  Treatment Recommendations  Treatment Recommendations Therapy as outlined in treatment plan below        12/21/2024    1:42 PM  Prognosis  Prognosis for improved oropharyngeal function Good  Barriers to Reach Goals --  Barriers/Prognosis Comment --    Swallow Evaluation Recommendations Recommendations: PO diet PO Diet Recommendation: Dysphagia 3 (Mechanical soft);Thin liquids (Level 0) Liquid Administration via: Cup;Straw Medication Administration: Whole meds with puree Supervision: Patient able to self-feed;Full  supervision/cueing for swallowing strategies Postural changes: Position pt fully upright for meals Oral care recommendations: Oral care BID (2x/day)          12/21/2024    1:42 PM  Other Recommendations  Recommended Consults --  Oral Care Recommendations --  Caregiver Recommendations --  Follow Up Recommendations Acute inpatient rehab (3hours/day)  Assistance recommended at discharge --  Functional Status Assessment Patient has had a recent decline in their functional status and demonstrates the  ability to make significant improvements in function in a reasonable and predictable amount of time.       12/21/2024    1:42 PM  Frequency and Duration   Speech Therapy Frequency (ACUTE ONLY) min 2x/week  Treatment Duration 2 weeks         12/21/2024    1:42 PM  Oral Phase  Oral Phase WFL  Oral - Pudding Teaspoon --  Oral - Pudding Cup --  Oral - Honey Teaspoon --  Oral - Honey Cup --  Oral - Nectar Teaspoon --  Oral - Nectar Cup --  Oral - Nectar Straw --  Oral - Thin Teaspoon --  Oral - Thin Cup --  Oral - Thin Straw --  Oral - Puree --  Oral - Mech Soft --  Oral - Regular --  Oral - Multi-Consistency --  Oral - Pill --  Oral Phase - Comment --       12/21/2024    1:42 PM  Pharyngeal Phase  Pharyngeal Phase Impaired  Pharyngeal- Pudding Teaspoon --  Pharyngeal --  Pharyngeal- Pudding Cup --  Pharyngeal --  Pharyngeal- Honey Teaspoon --  Pharyngeal --  Pharyngeal- Honey Cup --  Pharyngeal --  Pharyngeal- Nectar Teaspoon --  Pharyngeal --  Pharyngeal- Nectar Cup --  Pharyngeal --  Pharyngeal- Nectar Straw --  Pharyngeal --  Pharyngeal- Thin Teaspoon Reduced pharyngeal peristalsis;Reduced tongue base retraction;Pharyngeal residue - pyriform;Pharyngeal residue - valleculae  Pharyngeal --  Pharyngeal- Thin Cup Reduced pharyngeal peristalsis;Reduced tongue base retraction;Pharyngeal residue - pyriform;Pharyngeal residue - valleculae  Pharyngeal --  Pharyngeal- Thin Straw Reduced pharyngeal peristalsis;Reduced tongue base retraction;Pharyngeal residue - pyriform;Pharyngeal residue - valleculae  Pharyngeal --  Pharyngeal- Puree Reduced pharyngeal peristalsis;Reduced tongue base retraction;Pharyngeal residue - pyriform;Pharyngeal residue - valleculae  Pharyngeal --  Pharyngeal- Mechanical Soft --  Pharyngeal --  Pharyngeal- Regular Reduced pharyngeal peristalsis;Reduced tongue base retraction;Pharyngeal residue - pyriform;Pharyngeal residue - valleculae   Pharyngeal --  Pharyngeal- Multi-consistency --  Pharyngeal --  Pharyngeal- Pill --  Pharyngeal --  Pharyngeal Comment --        12/21/2024    1:42 PM  Cervical Esophageal Phase   Cervical Esophageal Phase WFL  Pudding Teaspoon --  Pudding Cup --  Honey Teaspoon --  Honey Cup --  Nectar Teaspoon --  Nectar Cup --  Nectar Straw --  Thin Teaspoon --  Thin Cup --  Thin Straw --  Puree --  Mechanical Soft --  Regular --  Multi-consistency --  Pill --  Cervical Esophageal Comment --     Leita SAILOR., M.A. CCC-SLP Acute Rehabilitation Services Office: [phone]  Secure chat preferred  12/21/2024, 3:14 PM

## 2024-12-21 NOTE — Progress Notes (Signed)
 ANTICOAGULATION CONSULT NOTE  Pharmacy Consult for heparin  Indication: bA/MVR  Allergies[1]  Patient Measurements: Height: 5' 10 (177.8 cm) Weight: 75 kg (165 lb 5.5 oz) IBW/kg (Calculated) : 73 Heparin  Dosing Weight: 70 kg   Vital Signs: Temp: 98.8 F (37.1 C) (12/29 1100) Temp Source: Oral (12/29 1100) BP: 140/89 (12/29 1610) Pulse Rate: 98 (12/29 1610)  Labs: Recent Labs    12/19/24 0508 12/19/24 0519 12/20/24 0440 12/20/24 1251 12/21/24 0436 12/21/24 0509 12/21/24 1537  HGB 7.9*   < > 7.9*  --  8.3* 11.9*  --   HCT 24.1*   < > 24.3*  --  25.0* 35.0*  --   PLT 529*  --  593*  --  608*  --   --   HEPARINUNFRC <0.10*   < > <0.10* <0.10* <0.10*  --  0.13*  CREATININE 0.43*  --   --  0.42* 0.48*  --   --    < > = values in this interval not displayed.    Estimated Creatinine Clearance: 120.4 mL/min (A) (by C-G formula based on SCr of 0.48 mg/dL (L)).  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  Postop course complicated by ischemic bowel s/p exlap 12/11 with partial small bowel resection, abdomen left open.  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   Per Dr. Lucas, plan for 3 months of OAC with dual valve replacement. Not on anticoagulation prior to admission.  Pharmacy consulted for heparin  dosing.  Impella 5.5 removed 12/24, heparin  held. Ok to resume 12/26 with low therapeutic goal and slow titrations.  12/29 PM update: Heparin  level remains sub-therapeutic at 0.13, but trending up after increase in heparin  infusion rate to 1500 units/hr. Infusing well without issues per RN. No bleeding documented. AM CBC improved.   Goal of Therapy:  Heparin  level 0.3-0.5 units/ml Monitor platelets by anticoagulation protocol: Yes   Plan:  Increase heparin  to 1600 units/hr (no bolus, slow titration to goal).  Check  6-8 hour heparin  level Monitor daily HL, CBC, and for s/sx of bleeding   Harlene Boga, PharmD Please refer to [facility] Pharmacy numbers 12/21/2024, 4:18 PM        [1] No Known Allergies

## 2024-12-21 NOTE — Evaluation (Signed)
 Passy-Muir Speaking Valve - Evaluation Patient Details  Name: Alejandro Lopez DOBIE MRN: [id number] Date of Birth: Aug 12, 1981  Today's Date: 12/21/2024 Time: 0912-0955 SLP Time Calculation (min) (ACUTE ONLY): 43 min  Past Medical History:  Past Medical History:  Diagnosis Date   Asthma    Past Surgical History:  Past Surgical History:  Procedure Laterality Date   AORTIC VALVE REPLACEMENT N/A 11/30/2024   Procedure: REPLACEMENT, AORTIC VALVE, OPEN WITH INSPIRIS RESILIA AORTIC VALVE 23mm;  Surgeon: Lucas Dorise POUR, MD;  Location: [facility] OR;  Service: Open Heart Surgery;  Laterality: N/A;   BOWEL RESECTION N/A 12/08/2024   Procedure: EXCISION, SMALL INTESTINE;  Surgeon: Ebbie Cough, MD;  Location: [facility] OR;  Service: General;  Laterality: N/A;   COLOSTOMY REVISION Right 12/06/2024   Procedure: COLECTOMY, RIGHT;  Surgeon: Tanda Locus, MD;  Location: [facility] OR;  Service: General;  Laterality: Right;   FRACTURE SURGERY     ILEOSTOMY N/A 12/08/2024   Procedure: CREATION, ILEOSTOMY END;  Surgeon: Ebbie Cough, MD;  Location: [facility] OR;  Service: General;  Laterality: N/A;   INTRAOPERATIVE TRANSESOPHAGEAL ECHOCARDIOGRAM N/A 11/30/2024   Procedure: ECHOCARDIOGRAM, TRANSESOPHAGEAL, INTRAOPERATIVE;  Surgeon: Lucas Dorise POUR, MD;  Location: [facility] OR;  Service: Open Heart Surgery;  Laterality: N/A;   IR ANGIOGRAM FOLLOW UP STUDY  12/04/2024   IR ANGIOGRAM SELECTIVE EACH ADDITIONAL VESSEL  12/04/2024   IR ANGIOGRAM VISCERAL SELECTIVE  12/04/2024   IR EMBO ART  VEN HEMORR LYMPH EXTRAV  INC GUIDE ROADMAPPING  12/04/2024   IR US  GUIDE VASC ACCESS RIGHT  12/04/2024   LAPAROTOMY N/A 12/03/2024   Procedure: LAPAROTOMY, EXPLORATORY small bowel resection abthrea therapy;  Surgeon: Sebastian Moles, MD;  Location: [facility] OR;  Service: General;  Laterality: N/A;  possible bowel resection   LAPAROTOMY N/A 12/06/2024   Procedure: LAPAROTOMY, EXPLORATORY; WOUND VAC CHANGE;  Surgeon: Tanda Locus, MD;  Location: [facility] OR;   Service: General;  Laterality: N/A;   LAPAROTOMY N/A 12/08/2024   Procedure: MACARIO LIVINGS;  Surgeon: Ebbie Cough, MD;  Location: [facility] OR;  Service: General;  Laterality: N/A;   MITRAL VALVE REPLACEMENT N/A 11/30/2024   Procedure: REPLACEMENT, MITRAL VALVE WITH MITRIS RESILIA MITRAL VALVE 27mm;  Surgeon: Lucas Dorise POUR, MD;  Location: [facility] OR;  Service: Open Heart Surgery;  Laterality: N/A;   PLACEMENT OF IMPELLA LEFT VENTRICULAR ASSIST DEVICE N/A 11/30/2024   Procedure: INSERTION, CARDIAC ASSIST DEVICE, IMPELLA;  Surgeon: Lucas Dorise POUR, MD;  Location[facility] OR;  Service: Open Heart Surgery;  Laterality: N/A;  IMPELLA 5.5 INSERTION   REMOVAL OF IMPELLA LEFT VENTRICULAR ASSIST DEVICE Right 12/16/2024   Procedure: REMOVAL, CARDIAC ASSIST DEVICE, IMPELLA;  Surgeon: Lucas Dorise POUR, MD;  Location: [facility] OR;  Service: Open Heart Surgery;  Laterality: Right;   RIGHT HEART CATH AND CORONARY ANGIOGRAPHY N/A 11/27/2024   Procedure: RIGHT HEART CATH AND CORONARY ANGIOGRAPHY;  Surgeon: Zenaida Morene PARAS, MD;  Location: [facility] INVASIVE CV LAB;  Service: Cardiovascular;  Laterality: N/A;   TRANSESOPHAGEAL ECHOCARDIOGRAM (CATH LAB) N/A 11/27/2024   Procedure: TRANSESOPHAGEAL ECHOCARDIOGRAM;  Surgeon: Zenaida Morene PARAS, MD;  Location: [facility] INVASIVE CV LAB;  Service: Cardiovascular;  Laterality: N/A;   VIDEO ASSISTED THORACOSCOPY (VATS)/DECORTICATION Right 12/10/2024   Procedure: VIDEO ASSISTED THORACOSCOPY (VATS)/DECORTICATION;  Surgeon: Shyrl Linnie KIDD, MD;  Location: [facility] OR;  Service: Thoracic;  Laterality: Right;   HPI:  The pt is a 43 yo male presenting 11/30 with swelling of LE, groin, and abdomen. Work up revealed acute HFrEF, severe MR, aortic insufficiency, and transaminates.  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 Extubated 12/9. S/p thoracentesis 12/11 with 1000cc's of blood drained from R pleural space, rapid recollection and pt then s/p R chest tube placed 12/11 with 2.5L bloody  output. S/p  coil embolization of right intercostal artery at level of right 10th rib, followed by ex lap with wound vac placement which showed patchy ischemia of ileum extending 75cm. Return to OR 12/14 with R colectomy and 12/16 for ostomy placement. To OR 12/18 for R VATS, washout, and pericardial window. Intubated 12/12-12/19, re-intubated 12/21. Trach 12/23. PMH includes: HTN, alcohol use (5 drinks/day), tobacco use, but is limited by limited access to healthcare.    Assessment / Plan / Recommendation  Clinical Impression  Pt shows evidence of good upper airway patency, wearing the valve for 35 minutes without overt signs of intolerance and producing phonation. Recommend that he wear it intermittently during the day while on TC, when full staff supervision can be provided.  Pt was transitioned from the vent to TC by RT, who also provided suctioning and deflated his cuff. He had subtle phonation upon cuff deflation, improving in strength as soon as PMV was donned. Dysphonia is still characterized by lower volume and intermittent wet quality. His cough with PMV in place is productive of secretions orally. He used his valve to call multiple family members, communicating over video chat with seemingly good comprehension of his speech as they only asked him to repeat on rare occasions. VS remained stable and no air trapping was observed. Note that recommendations to wear PMV with staff are only for when he is on TC, although SLP did provide pt with a vent valve, which could be trialed with SLP if he were to still fluctuate between the vent and TC for now.   SLP Visit Diagnosis: Aphonia (R49.1)    Recommendations for use/ supervision  Patient may use Passy-Muir Speech Valve: Intermittently with supervision;During all therapies with supervision PMSV Supervision: Full   SLP Assessment  Patient needs continued Speech Language Pathology Services   Assistance Recommended at Discharge Frequent or constant  Supervision/Assistance  Functional Status Assessment Patient has had a recent decline in their functional status and demonstrates the ability to make significant improvements in function in a reasonable and predictable amount of time.  Frequency and Duration min 2x/week  2 weeks    PMSV Trial PMSV was placed for: 35 Able to redirect subglottic air through upper airway: Yes Able to Attain Phonation: Yes Voice Quality: Low vocal intensity;Wet (intermittently wet) Able to Expectorate Secretions: Yes Level of Secretion Expectoration with PMSV: Oral Breath Support for Phonation: Mildly decreased Intelligibility: Intelligibility reduced Conversation: 75-100% accurate Respirations During Trial:  (mostly low to mid 20s) SpO2 During Trial: 99 % Pulse During Trial: 96 Behavior: Alert;Controlled;Cooperative;Expresses self well;Responsive to questions   Tracheostomy Tube       Vent Dependency  Vent Mode: (S) PSV;CPAP PEEP: 5 cmH20 Pressure Support: 10 cmH20 FiO2 (%): (S) 40 %    Cuff Deflation Trial Tolerated Cuff Deflation: Yes Length of Time for Cuff Deflation Trial: 37 min (left deflated) Behavior: Alert;Controlled;Cooperative;Expresses self well         Leita SAILOR., M.A. CCC-SLP Acute Rehabilitation Services Office: [phone]  Secure chat preferred  12/21/2024, 11:20 AM

## 2024-12-21 NOTE — Progress Notes (Signed)
" ° °  [address] [place] [address]             [phone]   Sitting up in bed.  On trach collar  BP (!) 129/90   Pulse 97   Temp 98.8 F (37.1 C) (Oral)   Resp (!) 35   Ht 5' 10 (1.778 m)   Wt 75 kg   SpO2 95%   BMI 23.72 kg/m    Intake/Output Summary (Last 24 hours) at 12/21/2024 1725 Last data filed at 12/21/2024 1700 Gross per 24 hour  Intake 4083.29 ml  Output 4190 ml  Net -106.71 ml   No new issues today  Alejandro C. Kerrin, MD [facility] [phone]  "

## 2024-12-21 NOTE — Evaluation (Signed)
 Clinical/Bedside Swallow Evaluation Patient Details  Name: Alejandro Lopez MRN: [id number] Date of Birth: [birth year]  Today's Date: 12/21/2024 Time: SLP Start Time (ACUTE ONLY): 0912 SLP Stop Time (ACUTE ONLY): 0955 SLP Time Calculation (min) (ACUTE ONLY): 43 min  Past Medical History:  Past Medical History:  Diagnosis Date   Asthma    Past Surgical History:  Past Surgical History:  Procedure Laterality Date   AORTIC VALVE REPLACEMENT N/A 11/30/2024   Procedure: REPLACEMENT, AORTIC VALVE, OPEN WITH INSPIRIS RESILIA AORTIC VALVE 23mm;  Surgeon: Lucas Dorise POUR, MD;  Location: [facility] OR;  Service: Open Heart Surgery;  Laterality: N/A;   BOWEL RESECTION N/A 12/08/2024   Procedure: EXCISION, SMALL INTESTINE;  Surgeon: Ebbie Cough, MD;  Location: [facility] OR;  Service: General;  Laterality: N/A;   COLOSTOMY REVISION Right 12/06/2024   Procedure: COLECTOMY, RIGHT;  Surgeon: Tanda Locus, MD;  Location: [facility] OR;  Service: General;  Laterality: Right;   FRACTURE SURGERY     ILEOSTOMY N/A 12/08/2024   Procedure: CREATION, ILEOSTOMY END;  Surgeon: Ebbie Cough, MD;  Location: [facility] OR;  Service: General;  Laterality: N/A;   INTRAOPERATIVE TRANSESOPHAGEAL ECHOCARDIOGRAM N/A 11/30/2024   Procedure: ECHOCARDIOGRAM, TRANSESOPHAGEAL, INTRAOPERATIVE;  Surgeon: Lucas Dorise POUR, MD;  Location: [facility] OR;  Service: Open Heart Surgery;  Laterality: N/A;   IR ANGIOGRAM FOLLOW UP STUDY  12/04/2024   IR ANGIOGRAM SELECTIVE EACH ADDITIONAL VESSEL  12/04/2024   IR ANGIOGRAM VISCERAL SELECTIVE  12/04/2024   IR EMBO ART  VEN HEMORR LYMPH EXTRAV  INC GUIDE ROADMAPPING  12/04/2024   IR US  GUIDE VASC ACCESS RIGHT  12/04/2024   LAPAROTOMY N/A 12/03/2024   Procedure: LAPAROTOMY, EXPLORATORY small bowel resection abthrea therapy;  Surgeon: Sebastian Moles, MD;  Location: [facility] OR;  Service: General;  Laterality: N/A;  possible bowel resection   LAPAROTOMY N/A 12/06/2024   Procedure: LAPAROTOMY, EXPLORATORY; WOUND VAC  CHANGE;  Surgeon: Tanda Locus, MD;  Location: [facility] OR;  Service: General;  Laterality: N/A;   LAPAROTOMY N/A 12/08/2024   Procedure: MACARIO LIVINGS;  Surgeon: Ebbie Cough, MD;  Location: [facility] OR;  Service: General;  Laterality: N/A;   MITRAL VALVE REPLACEMENT N/A 11/30/2024   Procedure: REPLACEMENT, MITRAL VALVE WITH MITRIS RESILIA MITRAL VALVE 27mm;  Surgeon: Lucas Dorise POUR, MD;  Location: [facility] OR;  Service: Open Heart Surgery;  Laterality: N/A;   PLACEMENT OF IMPELLA LEFT VENTRICULAR ASSIST DEVICE N/A 11/30/2024   Procedure: INSERTION, CARDIAC ASSIST DEVICE, IMPELLA;  Surgeon: Lucas Dorise POUR, MD;  Location: [facility] OR;  Service: Open Heart Surgery;  Laterality: N/A;  IMPELLA 5.5 INSERTION   REMOVAL OF IMPELLA LEFT VENTRICULAR ASSIST DEVICE Right 12/16/2024   Procedure: REMOVAL, CARDIAC ASSIST DEVICE, IMPELLA;  Surgeon: Lucas Dorise POUR, MD;  Location: [facility] OR;  Service: Open Heart Surgery;  Laterality: Right;   RIGHT HEART CATH AND CORONARY ANGIOGRAPHY N/A 11/27/2024   Procedure: RIGHT HEART CATH AND CORONARY ANGIOGRAPHY;  Surgeon: Zenaida Morene PARAS, MD;  Location: [facility] INVASIVE CV LAB;  Service: Cardiovascular;  Laterality: N/A;   TRANSESOPHAGEAL ECHOCARDIOGRAM (CATH LAB) N/A 11/27/2024   Procedure: TRANSESOPHAGEAL ECHOCARDIOGRAM;  Surgeon: Zenaida Morene PARAS, MD;  Location: [facility] INVASIVE CV LAB;  Service: Cardiovascular;  Laterality: N/A;   VIDEO ASSISTED THORACOSCOPY (VATS)/DECORTICATION Right 12/10/2024   Procedure: VIDEO ASSISTED THORACOSCOPY (VATS)/DECORTICATION;  Surgeon: Shyrl Linnie KIDD, MD;  Location: [facility] OR;  Service: Thoracic;  Laterality: Right;   HPI:  The pt is a 43 yo male presenting 11/30 with swelling of LE, groin, and abdomen. Work up  revealed acute HFrEF, severe MR, aortic insufficiency, and transaminates. 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 Extubated 12/9. S/p thoracentesis 12/11 with 1000cc's of blood drained from R pleural space, rapid recollection and  pt then s/p R chest tube placed 12/11 with 2.5L bloody output. S/p  coil embolization of right intercostal artery at level of right 10th rib, followed by ex lap with wound vac placement which showed patchy ischemia of ileum extending 75cm. Return to OR 12/14 with R colectomy and 12/16 for ostomy placement. To OR 12/18 for R VATS, washout, and pericardial window. Intubated 12/12-12/19, re-intubated 12/21. Trach 12/23. PMH includes: HTN, alcohol use (5 drinks/day), tobacco use, but is limited by limited access to healthcare.    Assessment / Plan / Recommendation  Clinical Impression  Pt presents with multiple risk factors for dysphagia with increased risk of silent aspiration as well. For this, he would benefit from instrumental testing prior to initiating any POs. Pt was educated on recommendation to remain NPO pending FEES, scheduled for this afternoon. Will ideally attempt while still on TC with PMV in place.   Pt has had prolonged oral intubation across three intubations this admissions, now with new trach. His voice is dysphonic and intermittently wet, concerning for decreased management of secretions. Initially he required cueing from SLP for awareness of wet vocal quality and prompts to cough. Despite cough that lacks crisp quality, he is productive of secretions orally and uses a yankauer on his own for removal. As eval progressed, pt showed increased spontaneity of coughing in the setting of wet voicing. He is very eager about wanting POs, and has demonstrated impulsivity with staff (reportedly got access to a Gatorade from family the other day and drank the whole thing), so would recommend proceeding straight to FEES to best evaluate oropharyngeal swallow and airway protection during swallowing. This is tentatively scheduled for this afternoon, and ideally can be done while on TC. SLP did coordinate with RT in case he were to go back on the vent though.   SLP Visit Diagnosis: Dysphagia, unspecified  (R13.10)    Other Recommendations Caregiver Recommendations: Have oral suction available     Swallow Evaluation Recommendations Recommendations: NPO Medication Administration: Via alternative means Oral care recommendations: Oral care QID (4x/day) Caregiver Recommendations: Have oral suction available   Assistance Recommended at Discharge Frequent or constant Supervision/Assistance  Functional Status Assessment Patient has had a recent decline in their functional status and demonstrates the ability to make significant improvements in function in a reasonable and predictable amount of time.  Frequency and Duration min 2x/week          Prognosis        Swallow Study   General HPI: The pt is a 43 yo male presenting 11/30 with swelling of LE, groin, and abdomen. Work up revealed acute HFrEF, severe MR, aortic insufficiency, and transaminates. 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 Extubated 12/9. S/p thoracentesis 12/11 with 1000cc's of blood drained from R pleural space, rapid recollection and pt then s/p R chest tube placed 12/11 with 2.5L bloody output. S/p  coil embolization of right intercostal artery at level of right 10th rib, followed by ex lap with wound vac placement which showed patchy ischemia of ileum extending 75cm. Return to OR 12/14 with R colectomy and 12/16 for ostomy placement. To OR 12/18 for R VATS, washout, and pericardial window. Intubated 12/12-12/19, re-intubated 12/21. Trach 12/23. PMH includes: HTN, alcohol use (5 drinks/day),  tobacco use, but is limited by limited access to healthcare. Type of Study: Bedside Swallow Evaluation Previous Swallow Assessment: none in chart Diet Prior to this Study: NPO;Cortrak/Small bore NG tube Temperature Spikes Noted: No Respiratory Status: Trach;Trach Collar Trach Size and Type: Cuff;#6;Deflated;With PMSV in place History of Recent Intubation: Yes (x3 intubations) Total duration of intubation (days): 13 days Date  extubated:  (trach 12/23) Behavior/Cognition: Alert;Cooperative;Pleasant mood;Impulsive Oral Cavity Assessment: Within Functional Limits Oral Care Completed by SLP: No Oral Cavity - Dentition: Adequate natural dentition Vision: Functional for self-feeding Patient Positioning: Upright in bed Baseline Vocal Quality: Hoarse;Wet Volitional Cough: Weak Volitional Swallow: Able to elicit    Oral/Motor/Sensory Function Overall Oral Motor/Sensory Function: Within functional limits   Ice Chips Ice chips: Not tested   Thin Liquid Thin Liquid: Not tested    Nectar Thick Nectar Thick Liquid: Not tested   Honey Thick Honey Thick Liquid: Not tested   Puree Puree: Not tested   Solid     Solid: Not tested      Leita SAILOR., M.A. CCC-SLP Acute Rehabilitation Services Office: [phone]  Secure chat preferred  12/21/2024,11:38 AM

## 2024-12-21 NOTE — Progress Notes (Signed)
 Nutrition Follow-up  DOCUMENTATION CODES:   Not applicable  INTERVENTION:  ***   NUTRITION DIAGNOSIS:   Inadequate oral intake related to acute illness, altered GI function as evidenced by NPO status.  ***  GOAL:   Patient will meet greater than or equal to 90% of their needs  ***  MONITOR:   Vent status, TF tolerance, Labs, Skin, I & O's, Weight trends (TPN)  REASON FOR ASSESSMENT:   Consult, Ventilator New TPN/TNA, Assessment of nutrition requirement/status  ASSESSMENT:   43 yo male admitted with acute systolic heart failure with severe MR and AI. Pt to OR on 12/08 for AVR, MVR and Impella 5.5 placement and extubated 12/09. Pt with thoracentesis on 12/11 followed by IR coil embolization for bleeding intercostal artery with R hemothorax, pt also developed ischemic bowel requiring bowel resection. PMH includes HTN, tobacco and Etoh abuse (reports drinking at least 5 drinks per night)  ***  Minimal ileum remaining, duodenum and jejunum intact  NUTRITION - FOCUSED PHYSICAL EXAM:  {RD Focused Exam List:21252}  Diet Order:   Diet Order             Diet NPO time specified  Diet effective now                   EDUCATION NEEDS:   Not appropriate for education at this time  Skin:  Skin Assessment: Skin Integrity Issues: Skin Integrity Issues:: Other (Comment), DTI DTI: buttocks Wound Vac: open abdomen-ABThera Incisions: chest x 2 Other: new ileostomy  Last BM:  450 ml ileostomy output x 24 h  Height:   Ht Readings from Last 1 Encounters:  12/14/24 5' 10 (1.778 m)    Weight:   Wt Readings from Last 1 Encounters:  12/21/24 75 kg    Ideal Body Weight:     BMI:  Body mass index is 23.72 kg/m.  Estimated Nutritional Needs:   Kcal:  2200-2400kcals  Protein:  125-150 g  Fluid:  1.8 L    ***

## 2024-12-21 NOTE — Progress Notes (Signed)
 PHARMACY - TOTAL PARENTERAL NUTRITION CONSULT NOTE   Indication: massive bowel resection   Patient Measurements: Height: 5' 10 (177.8 cm) Weight: 75 kg (165 lb 5.5 oz) IBW/kg (Calculated) : 73 TPN AdjBW (KG): 73.4 Body mass index is 23.72 kg/m. Usual Weight: weight 79kg on admission, unclear baseline.   Assessment:  43 yo M s/p aortic and mitral valve replacement with Impella on 12/8. Post-op complicated by persistent HgB drop with abdominal pain, found to have hemorrhage from R intercostal artery s/p chest tube and pneumatosis and concern for bowel ischemia. Patient went to OR on 12/12 for ex-lap found to have ischemia of distal ileum over 75cm without perforation. Patient was left in discontinuity and continued to require 3 vasopressors post-op, concerning for ongoing ischemia. Patient with diet throughout admission however noted nausea and abdominal pain during peri-op time period. On 12/21, tube feeds attempte but patient with vomiting so held again. Patient's EF is ~25%. Pharmacy consulted to manage TPN.   Impella removed 12/25.    GI Imaging: 12/11 CT: pneumatosis of small bowel with gas, concerning for bowel ischemia  12/12 KUB: Diffuse gaseous distention of colon evident, similar to prior. 12/17 CT: Status post right colectomy with right periumbilical ileostomy, Radiopaque material layering within the gallbladder, Moderate soft tissue edema of the abdomen and pelvis.  12/22 KUB: Nonobstructive bowel gas pattern. 12/25 KUB: mild SB Dilation, Nonobstructive bowel gas pattern GI Surgeries / Procedures:  12/12 ex-lap, distal ileum with necrosis without perforation, 75cm small bowel resection, left in discontinuity, VAC placed  12/14 ex lap, terminal ileum and ascending colon had patchy necrosis, right colectomy, abdomen left open, placement of wound vac,  12/16 dusky at end of small bowel, small bowel resection, end ileostomy, closure  12/19 Cortrak placed 12/21 Cortrak removed,  NGT placed 12/25 patient pulled NGT, cortrak placed  Central access: PICC placed 12/04/24> removed 12/27, CVC 12/21  TPN start date: 12/04/24  Nutritional Goals:  Goal concentrated TPN rate is 75 mL/hr, provides 133 g protein, 2257 kcals   RD Estimated Needs Total Energy Estimated Needs: 2200-2400kcals Total Protein Estimated Needs: 125-150 g Total Fluid Estimated Needs: 1.8 L  Current Nutrition:  TPN + NPO  12/20 CLD, TF 30 ml/hr- ostomy not working yet >> emesis > Hold TF  12/21 NGT with dark thick, bilious effluent 12/22 thin brown stool from ostomy  12/23 propofol  at 26 ml/hr, Remove lipids from TPN  12/24 TF @ 7ml/hr, no titration for at least 24hr 12/25 TF @ 24ml/hr>> stopped at 1900; propofol  off and plan to stay off  12/26 TF off > restart at 59ml/hr (goal 60 ml/hr) 12/27 TF at 40 ml/hr 12/28 TF at 40 ml/hr with gas pain, lactose intolerant, no good alternative formula, to continue at 82ml/hr  12/29 TF at goal of 60 ml/hr  Plan:  Wean TPN to off this afternoon.  Thank you for allowing pharmacy to be a part of this patients care.  Donny Alert, PharmD, [facility] Clinical Pharmacist Please see AMION for all Pharmacists' Contact Phone Numbers 12/21/2024, 8:02 AM

## 2024-12-21 NOTE — Progress Notes (Signed)
 "  Trauma/Critical Care Follow Up Note  Subjective:    Overnight Issues:   Denies abdominal pain   Objective:  Vital signs for last 24 hours: Temp:  [97.5 F (36.4 C)-99.2 F (37.3 C)] 97.5 F (36.4 C) (12/29 0700) Pulse Rate:  [85-104] 98 (12/29 0915) Resp:  [15-26] 20 (12/29 0915) BP: (89-150)/(56-134) 114/85 (12/29 0900) SpO2:  [99 %-100 %] 99 % (12/29 0915) FiO2 (%):  [40 %] 40 % (12/29 0915) Weight:  [75 kg] 75 kg (12/29 0500)  Intake/Output from previous day: 12/28 0701 - 12/29 0700 In: 3596.5 [I.V.:1392.8; NG/GT:2077.7; IV Piggyback:116.1] Out: 4090 [Urine:1900; Drains:40; Stool:2150]  Intake/Output this shift: Total I/O In: 115 [I.V.:55; NG/GT:60] Out: -   Vent settings for last 24 hours: Vent Mode: PSV;CPAP FiO2 (%):  [40 %] 40 % Set Rate:  [20 bmp] 20 bmp Vt Set:  [580 mL] 580 mL PEEP:  [5 cmH20] 5 cmH20 Pressure Support:  [10 cmH20] 10 cmH20 Plateau Pressure:  [19 cmH20-21 cmH20] 21 cmH20  Physical Exam:  Gen: comfortable, no distress Neuro: follows commands HEENT: PERRL, trach in place Neck: supple CV: RRR Pulm: unlabored breathing on trach, currently on vent Abd: soft, NT, midline vac in place , ostomy productive (2150, thin) and mucoid stool from rectum GU: urine clear and yellow, +spontaneous voids Extr: wwp, no edema  Results for orders placed or performed during the hospital encounter of 11/22/24 (from the past 24 hours)  Heparin  level (unfractionated)     Status: Abnormal   Collection Time: 12/20/24 12:51 PM  Result Value Ref Range   Heparin  Unfractionated <0.10 (L) 0.30 - 0.70 IU/mL  Basic metabolic panel with GFR     Status: Abnormal   Collection Time: 12/20/24 12:51 PM  Result Value Ref Range   Sodium 139 135 - 145 mmol/L   Potassium 4.7 3.5 - 5.1 mmol/L   Chloride 107 98 - 111 mmol/L   CO2 22 22 - 32 mmol/L   Glucose, Bld 96 70 - 99 mg/dL   BUN 16 6 - 20 mg/dL   Creatinine, Ser 9.57 (L) 0.61 - 1.24 mg/dL   Calcium  9.2 8.9 - 10.3  mg/dL   GFR, Estimated >39 >39 mL/min   Anion gap 10 5 - 15  Phosphorus     Status: None   Collection Time: 12/20/24 12:51 PM  Result Value Ref Range   Phosphorus 3.6 2.5 - 4.6 mg/dL  Phosphorus     Status: None   Collection Time: 12/21/24  4:36 AM  Result Value Ref Range   Phosphorus 3.7 2.5 - 4.6 mg/dL  Triglycerides     Status: None   Collection Time: 12/21/24  4:36 AM  Result Value Ref Range   Triglycerides 116 <150 mg/dL  Magnesium      Status: None   Collection Time: 12/21/24  4:36 AM  Result Value Ref Range   Magnesium  1.7 1.7 - 2.4 mg/dL  Comprehensive metabolic panel     Status: Abnormal   Collection Time: 12/21/24  4:36 AM  Result Value Ref Range   Sodium 136 135 - 145 mmol/L   Potassium 4.7 3.5 - 5.1 mmol/L   Chloride 104 98 - 111 mmol/L   CO2 23 22 - 32 mmol/L   Glucose, Bld 111 (H) 70 - 99 mg/dL   BUN 15 6 - 20 mg/dL   Creatinine, Ser 9.51 (L) 0.61 - 1.24 mg/dL   Calcium  9.0 8.9 - 10.3 mg/dL   Total Protein 7.0 6.5 - 8.1 g/dL  Albumin  2.3 (L) 3.5 - 5.0 g/dL   AST 863 (H) 15 - 41 U/L   ALT 259 (H) 0 - 44 U/L   Alkaline Phosphatase 351 (H) 38 - 126 U/L   Total Bilirubin 1.4 (H) 0.0 - 1.2 mg/dL   GFR, Estimated >39 >39 mL/min   Anion gap 9 5 - 15  CBC     Status: Abnormal   Collection Time: 12/21/24  4:36 AM  Result Value Ref Range   WBC 14.0 (H) 4.0 - 10.5 K/uL   RBC 2.80 (L) 4.22 - 5.81 MIL/uL   Hemoglobin 8.3 (L) 13.0 - 17.0 g/dL   HCT 74.9 (L) 60.9 - 47.9 %   MCV 89.3 80.0 - 100.0 fL   MCH 29.6 26.0 - 34.0 pg   MCHC 33.2 30.0 - 36.0 g/dL   RDW 82.3 (H) 88.4 - 84.4 %   Platelets 608 (H) 150 - 400 K/uL   nRBC 0.2 0.0 - 0.2 %  Heparin  level (unfractionated)     Status: Abnormal   Collection Time: 12/21/24  4:36 AM  Result Value Ref Range   Heparin  Unfractionated <0.10 (L) 0.30 - 0.70 IU/mL  Cooxemetry Panel (carboxy, met, total hgb, O2 sat)     Status: Abnormal   Collection Time: 12/21/24  4:46 AM  Result Value Ref Range   Total hemoglobin 7.6 (L)  12.0 - 16.0 g/dL   O2 Saturation 10.8 %   Carboxyhemoglobin 1.8 (H) 0.5 - 1.5 %   Methemoglobin <0.7 0.0 - 1.5 %  I-STAT 7, (LYTES, BLD GAS, ICA, H+H)     Status: Abnormal   Collection Time: 12/21/24  5:09 AM  Result Value Ref Range   pH, Arterial 7.477 (H) 7.35 - 7.45   pCO2 arterial 33.5 32 - 48 mmHg   pO2, Arterial 145 (H) 83 - 108 mmHg   Bicarbonate 24.7 20.0 - 28.0 mmol/L   TCO2 26 22 - 32 mmol/L   O2 Saturation 99 %   Acid-Base Excess 2.0 0.0 - 2.0 mmol/L   Sodium 137 135 - 145 mmol/L   Potassium 4.4 3.5 - 5.1 mmol/L   Calcium , Ion 1.24 1.15 - 1.40 mmol/L   HCT 35.0 (L) 39.0 - 52.0 %   Hemoglobin 11.9 (L) 13.0 - 17.0 g/dL   Sample type ARTERIAL     Assessment & Plan:  LOS: 28 days   Additional comments:I reviewed the patient's new clinical lab test results.   and I reviewed the patients new imaging test results.    43 y/o M S/P AVR, MCR, Impella placement by Dr. Lucas 12/8   S/P right thoracentesis 12/11 with hemorrhage from intercostal -chest tube placed by TCTS.  S/p emergent IR embolizaton  R intercostal artery by Dr. Philip.    S/P VATS 12/18    Pneumatosis of the pelvic small bowel and R colon with portal venous gas on CT  POD 16/14/12 status post ex lap with SBR & VAC placement, repeat laparotomy with patchy small bowel ischemia, end ileostomy/ab closure - WBC relatively stable  - ostomy output 2150 mL (goal 900-1,200 mL daily). On banatrol TID, increase imodium  from 2 mg to 4 mg q 6h. - woc following - vac changes twice weekly - TF @ goal, ok to stop TPN - Speech consulted for swallow eval, following - tentative plan for FEES this afternoon - Impella removal 12/24   FEN: NPO, IVF per primary, TPN, TF as above ID: cefepime , micafungin  completed; resp cx 12/18 with few C  albican, BCx 12/17 neg, BAL 12/23 rare yeast VTE: heparin  gtt Foley: in place Dispo: ICU   Almarie Pringle, [facility] Surgery Please see Amion for pager number during day  hours 7:00am-4:30pm       12/21/2024  *Care during the described time interval was provided by me. I have reviewed this patient's available data, including medical history, events of note, physical examination and test results as part of my evaluation.    "

## 2024-12-21 NOTE — Progress Notes (Signed)
 5 Days Post-Op Procedures (LRB): REMOVAL, CARDIAC ASSIST DEVICE, IMPELLA (Right) Subjective:  He continues to feel better. Put on trach collar today and did well and has PM valve per ST. Getting ready to do FEES.  Objective: Vital signs in last 24 hours: Temp:  [97.5 F (36.4 C)-99.2 F (37.3 C)] 98.8 F (37.1 C) (12/29 1100) Pulse Rate:  [86-104] 95 (12/29 1332) Cardiac Rhythm: Normal sinus rhythm (12/29 0800) Resp:  [13-37] 29 (12/29 1332) BP: (89-146)/(56-112) 123/84 (12/29 1300) SpO2:  [97 %-100 %] 98 % (12/29 1332) FiO2 (%):  [35 %-40 %] 35 % (12/29 1115) Weight:  [75 kg] 75 kg (12/29 0500)  Hemodynamic parameters for last 24 hours:    Intake/Output from previous day: 12/28 0701 - 12/29 0700 In: 3596.5 [I.V.:1392.8; NG/GT:2077.7; IV Piggyback:116.1] Out: 4090 [Urine:1900; Drains:40; Stool:2150] Intake/Output this shift: Total I/O In: 909 [I.V.:164.9; NG/GT:730; IV Piggyback:14.1] Out: 1050 [Urine:500; Stool:550]  General appearance: alert and cooperative Neurologic: intact Heart: regular rate and rhythm Lungs: diminished breath sounds right base Abdomen: soft, non-tender; bowel sounds normal Extremities: no edema Wound: chest incision and right axillary incisions healing well  Lab Results: Recent Labs    12/20/24 0440 12/21/24 0436 12/21/24 0509  WBC 12.0* 14.0*  --   HGB 7.9* 8.3* 11.9*  HCT 24.3* 25.0* 35.0*  PLT 593* 608*  --    BMET:  Recent Labs    12/20/24 1251 12/21/24 0436 12/21/24 0509  NA 139 136 137  K 4.7 4.7 4.4  CL 107 104  --   CO2 22 23  --   GLUCOSE 96 111*  --   BUN 16 15  --   CREATININE 0.42* 0.48*  --   CALCIUM  9.2 9.0  --     PT/INR: No results for input(s): LABPROT, INR in the last 72 hours. ABG    Component Value Date/Time   PHART 7.477 (H) 12/21/2024 0509   HCO3 24.7 12/21/2024 0509   TCO2 26 12/21/2024 0509   ACIDBASEDEF 2.0 12/19/2024 0519   O2SAT 99 12/21/2024 0509   CBG (last 3)  No results for  input(s): GLUCAP in the last 72 hours.  CXR: persistent opacity of RLL unchanged from 3 days ago.   Assessment/Plan:  S/P AVR/MVR with pericardial valves and Insertion of Impella 5.5 for postop support 11/30/24. S/P right thoracentesis 12/03/24 complicated by massive right hemothorax due to intercostal artery laceration embolized by IR.  S/P Ex lap, 75 cm ileal resection and wound vac by GS for ischemic distal ileum 12/04/24. S/P re-exploration abdomen and right colectomy without anastomosis for ischemia 12/06/24. S/P re-exploration abdomen, limited distal small bowel resection and end ileostomy 12/08/24. S/P right VATS for decortication and drainage of retained hemothorax 12/10/24. S/P Percutaneous trach by CCM on 12/15/24 S/P removal of Impella 5.5 on 12/16/24.  Hemodynamically stable in NSR. Echo yesterday showed LVEF of 40-45% with mild RV systolic dysfunction. Prosthetic aortic and mitral valves ok. Large complex posterior and lateral pericardial effusion without signs of tamponade. I think it is probably best to follow this for now since it does not appear to be causing problems and would require a left VATS for removal with some risk. Rhythm remains stable in sinus on amio down tube.  Continuing heparin  anticoagulation for now but will be able to transition to oral anticoagulation at any time, likely Coumadin .  Tolerating TF at goal. Plan to stop TNA and remove PICC. 2150 cc from ileostomy yesterday so will need to keep up with that.  Tmax 99 with WBC trending up to 14. BAL 12/23 negative. Plan to remove PICC. CXR looks stable. Incisions ok.   LOS: 28 days    Alejandro Lopez 12/21/2024

## 2024-12-21 NOTE — Progress Notes (Signed)
 Occupational Therapy Treatment Patient Details Name: Alejandro Lopez MRN: [id number] DOB: [birth year] Today's Date: 12/21/2024   History of present illness The pt is a 43 yo male presenting 11/30 with swelling of LE, groin, and abdomen. Work up revealed acute HFrEF, severe MR, aortic insufficiency, and transaminates. 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 Extubated 12/9. S/p thoracentesis 12/11 with 1000cc's of blood drained from R pleural space, rapid recollection and pt then s/p R chest tube placed 12/11 S/p  coil embolization of right intercostal artery at level of right 10th rib, followed by ex lap with wound vac placement which showed patchy ischemia of ileum extending 75cm. Return to OR 12/14 with R colectomy and 12/16 for ostomy placement. To OR 12/18 for R VATS, washout, and pericardial window. Intubated 12/12-12/19, re-intubated 12/21, trach placed 12/23. PMH includes: HTN, alcohol use (5 drinks/day), tobacco use, but is limited by limited access to healthcare.   OT comments  Goals updated as Pt had met previous goals. Today Pt making excellent progress towards activity tolerance, balance, functional transfers and communication. Pt overall mod A to set up for UB ADL, mod to max A for LB ADL. Very motivated and ambulation in hallway with EVA walker on trach collar (6L 40% FiO2) full details below for ADL. OT continues to recommend post-acute rehab of >3 hours daily to maximize safety and independence in ADL and functional transfers.       If plan is discharge home, recommend the following:  A little help with walking and/or transfers;A lot of help with bathing/dressing/bathroom;Assist for transportation;Help with stairs or ramp for entrance   Equipment Recommendations  Other (comment) (defer to next venue of care)    Recommendations for Other Services Rehab consult    Precautions / Restrictions Precautions Precautions: Fall;Sternal Precaution Booklet Issued:  No Recall of Precautions/Restrictions: Intact Precaution/Restrictions Comments: Feeding tube, wound vac, ostomy Restrictions Weight Bearing Restrictions Per Provider Order: Yes Other Position/Activity Restrictions: sternal precautions       Mobility Bed Mobility Overal bed mobility: Needs Assistance Bed Mobility: Rolling, Sidelying to Sit Rolling: Min assist Sidelying to sit: Mod assist       General bed mobility comments: cues for log rolling to support sternal precautions. Pt min A for rolling to the right and mod A for trunk elevation. Cues for sternal precautions    Transfers Overall transfer level: Needs assistance Equipment used:  (EVA walker) Transfers: Sit to/from Stand Sit to Stand: Min assist, +2 safety/equipment           General transfer comment: Pt with good power up, min A for steadying assist and +2 for line management and safety     Balance Overall balance assessment: Needs assistance Sitting-balance support: Feet supported, No upper extremity supported Sitting balance-Leahy Scale: Fair     Standing balance support: Bilateral upper extremity supported, Reliant on assistive device for balance Standing balance-Leahy Scale: Poor                             ADL either performed or assessed with clinical judgement   ADL Overall ADL's : Needs assistance/impaired Eating/Feeding: NPO   Grooming: Set up;Oral care;Sitting Grooming Details (indicate cue type and reason): EOB Upper Body Bathing: Moderate assistance Upper Body Bathing Details (indicate cue type and reason): for back Lower Body Bathing: Moderate assistance Lower Body Bathing Details (indicate cue type and reason): knees down Upper Body Dressing : Minimal assistance  Lower Body Dressing: Maximal assistance Lower Body Dressing Details (indicate cue type and reason): socks Toilet Transfer: Minimal assistance;+2 for safety/equipment;Ambulation (EVA walker)   Toileting- Clothing  Manipulation and Hygiene: Maximal assistance;Sit to/from stand Toileting - Clothing Manipulation Details (indicate cue type and reason): pt able to maintain standing for rear peri care     Functional mobility during ADLs: Minimal assistance;+2 for safety/equipment (EVA walker)      Extremity/Trunk Assessment Upper Extremity Assessment Upper Extremity Assessment: Generalized weakness   Lower Extremity Assessment Lower Extremity Assessment: Defer to PT evaluation        Vision       Perception     Praxis     Communication Communication Communication: Impaired (but good attempts at mouthing and can write to clarify) Factors Affecting Communication: Trach/intubated   Cognition Arousal: Alert Behavior During Therapy: WFL for tasks assessed/performed Cognition: Difficult to assess Difficult to assess due to: Tracheostomy           OT - Cognition Comments: suspect WFL at baseline with some safety awareness deficits -following commands, interacting appropriately and joking with staff                 Following commands: Intact        Cueing   Cueing Techniques: Verbal cues, Visual cues  Exercises      Shoulder Instructions       General Comments VSS on trach collar 6L 40% FiO2, HR WFL, RR up to 34 with activity but SpO2 >90% throughout session. No noticeable secretions throughout session    Pertinent Vitals/ Pain       Pain Assessment Pain Assessment: Faces Faces Pain Scale: Hurts little more Pain Location: abdomen Pain Descriptors / Indicators: Cramping Pain Intervention(s): Monitored during session, Repositioned, Patient requesting pain meds-RN notified  Home Living                                          Prior Functioning/Environment              Frequency  Min 2X/week        Progress Toward Goals  OT Goals(current goals can now be found in the care plan section)  Progress towards OT goals: Progressing toward  goals  Acute Rehab OT Goals Patient Stated Goal: get to rehab and get stronger OT Goal Formulation: With patient/family Time For Goal Achievement: 12/28/24 Potential to Achieve Goals: Good ADL Goals Pt Will Perform Grooming: with supervision;standing Pt Will Perform Upper Body Dressing: with set-up;sitting Pt Will Perform Lower Body Dressing: with supervision;sit to/from stand;with adaptive equipment Pt Will Transfer to Toilet: with supervision;ambulating Pt Will Perform Toileting - Clothing Manipulation and hygiene: with supervision;sit to/from stand Additional ADL Goal #1: Pt will maintain sternal precautions with 1 or less cues during functional ADL  Plan      Co-evaluation    PT/OT/SLP Co-Evaluation/Treatment: Yes Reason for Co-Treatment: For patient/therapist safety;To address functional/ADL transfers PT goals addressed during session: Mobility/safety with mobility;Balance;Proper use of DME;Strengthening/ROM OT goals addressed during session: ADL's and self-care;Strengthening/ROM;Proper use of Adaptive equipment and DME      AM-PAC OT 6 Clicks Daily Activity     Outcome Measure   Help from another person eating meals?: Total (NPO) Help from another person taking care of personal grooming?: A Little Help from another person toileting, which includes using toliet, bedpan, or urinal?: A Lot Help from  another person bathing (including washing, rinsing, drying)?: A Lot Help from another person to put on and taking off regular upper body clothing?: A Little Help from another person to put on and taking off regular lower body clothing?: A Lot 6 Click Score: 13    End of Session Equipment Utilized During Treatment: Oxygen  (EVA walker)  OT Visit Diagnosis: Unsteadiness on feet (R26.81);Other abnormalities of gait and mobility (R26.89);Muscle weakness (generalized) (M62.81);Other symptoms and signs involving the nervous system (R29.898);Other symptoms and signs involving  cognitive function   Activity Tolerance Patient tolerated treatment well   Patient Left in chair;with call bell/phone within reach;with chair alarm set;with nursing/sitter in room   Nurse Communication Mobility status;Precautions        Time: 8879-8845 OT Time Calculation (min): 34 min  Charges: OT General Charges $OT Visit: 1 Visit OT Treatments $Self Care/Home Management : 8-22 mins  Leita DEL OTR/L Acute Rehabilitation Services Office: [phone]  Leita PARAS [facility] 12/21/2024, 1:39 PM

## 2024-12-21 NOTE — Progress Notes (Signed)
 ANTICOAGULATION CONSULT NOTE  Pharmacy Consult for heparin  Indication: bA/MVR  Allergies[1]  Patient Measurements: Height: 5' 10 (177.8 cm) Weight: 75 kg (165 lb 5.5 oz) IBW/kg (Calculated) : 73 Heparin  Dosing Weight: 70 kg   Vital Signs: Temp: 99 F (37.2 C) (12/29 0400) Temp Source: Oral (12/29 0400) BP: 108/75 (12/29 0500) Pulse Rate: 92 (12/29 0500)  Labs: Recent Labs    12/18/24 0824 12/18/24 2122 12/19/24 0508 12/19/24 0519 12/20/24 0440 12/20/24 1251 12/21/24 0436 12/21/24 0509  HGB  --   --  7.9*   < > 7.9*  --  8.3* 11.9*  HCT  --   --  24.1*   < > 24.3*  --  25.0* 35.0*  PLT  --   --  529*  --  593*  --  608*  --   HEPARINUNFRC  --    < > <0.10*   < > <0.10* <0.10* <0.10*  --   CREATININE 0.46*  --  0.43*  --   --  0.42*  --   --    < > = values in this interval not displayed.    Estimated Creatinine Clearance: 120.4 mL/min (A) (by C-G formula based on SCr of 0.42 mg/dL (L)).  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  Postop course complicated by ischemic bowel s/p exlap 12/11 with partial small bowel resection, abdomen left open.  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   Per Dr. Lucas, plan for 3 months of OAC with dual valve replacement. Not on anticoagulation prior to admission.  Pharmacy consulted for heparin  dosing.  Impella 5.5 removed 12/24, heparin  held. Ok to resume 12/26 with low therapeutic goal and slow titrations.  12/29 AM update:  Heparin  level remains sub-therapeutic   Goal of Therapy:  Heparin  level 0.3-0.5 units/ml Monitor platelets by anticoagulation protocol: Yes   Plan:  Increase heparin  to 1500 units/hr Check 6-8 hour heparin  level Monitor daily HL, CBC, and for s/sx of bleeding   Lynwood Mckusick, PharmD, BCPS Clinical Pharmacist Phone: [phone]       [1] No  Known Allergies

## 2024-12-21 NOTE — Progress Notes (Signed)
 "    Advanced Heart Failure Rounding Note  Cardiologist: None  Chief Complaint: Valvular Heart Disease & HFrEF Patient Profile   43 y.o. male w/ h/o heavy alcohol use (at least 5 drinks per evening) and h/o asthma admitted w/ acute systolic heart failure. Strong family history of cardiac disease: mother and maternal grandmother had heart failure and father with CAD. Admitted with acute HFrEF/cardiogenic shock. Echo w/ severe AR, MR and LV dysfunction.    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  12/9 VT/VF ? vagal episode. Extubated 12/11: S/p R thoracentesis with resultant hemothorax.  Chest tube placed. S/p R intercostal artery coil (IR). S/p exp lap for ischemic bowel resection, bowel left in discontinuity 12/15: Back to OR for ischemic R colon and mesenteric ischemia. S/p exp/lap, wound vac exchange, R colectomy without anastomosis.  12/16 Back to OR for small bowel resection and ileostomy  12/18 OR for VATS/hemothorax washout, pericardial window was not done.  12/19 extubated 12/21 Reintubated. Ileus 12/23 S/p Trach. Fever post procedure, Tmax 101. Abx switched to cefepime    12/24 Impella removed 12/25: DBA stopped. Diuretics held d/t low CVPs  Subjective:    Off pressors/inotropes. Net negative, high ostomy OP. Co-ox 89%.  Micafungin  completes today.  On 10/5 on vent.   Sitting up in bed. No shortness of breath.   Objective:    Weight Range: 75 kg Body mass index is 23.72 kg/m.   Vital Signs:   Temp:  [98.6 F (37 C)-99.2 F (37.3 C)] 99 F (37.2 C) (12/29 0400) Pulse Rate:  [85-104] 95 (12/29 0600) Resp:  [15-25] 20 (12/29 0600) BP: (89-150)/(56-134) 108/75 (12/29 0500) SpO2:  [99 %-100 %] 100 % (12/29 0600) FiO2 (%):  [40 %] 40 % (12/29 0400) Weight:  [75 kg] 75 kg (12/29 0500) Last BM Date : 12/21/24  Weight change: Filed Weights   12/18/24 0500 12/19/24 0500 12/21/24 0500  Weight: 75.3 kg 72.1 kg 75 kg    Intake/Output:  Intake/Output Summary (Last 24 hours) at 12/21/2024 0705 Last data filed at 12/21/2024 0600 Gross per 24 hour  Intake 3481.52 ml  Output 4090 ml  Net -608.48 ml    Physical Exam   General: Ill appearing. No distress  Cardiac: JVP flat. No murmurs  Extremities: Warm and dry.  No peripheral edema.  Neuro: A/Ox3  Telemetry   SR 90s (personally reviewed)  Labs    CBC Recent Labs    12/20/24 0440 12/21/24 0436 12/21/24 0509  WBC 12.0* 14.0*  --   HGB 7.9* 8.3* 11.9*  HCT 24.3* 25.0* 35.0*  MCV 91.7 89.3  --   PLT 593* 608*  --    Basic Metabolic Panel Recent Labs    87/71/74 0440 12/20/24 1251 12/21/24 0436 12/21/24 0509  NA  --  139 136 137  K  --  4.7 4.7 4.4  CL  --  107 104  --   CO2  --  22 23  --   GLUCOSE  --  96 111*  --   BUN  --  16 15  --   CREATININE  --  0.42* 0.48*  --   CALCIUM   --  9.2 9.0  --   MG 1.8  --  1.7  --   PHOS  --  3.6 3.7  --    Liver Function Tests Recent Labs    12/20/24 0440 12/21/24 0436  AST 161* 136*  ALT 270* 259*  ALKPHOS 321* 351*  BILITOT 1.4* 1.4*  PROT 6.8 7.0  ALBUMIN  2.4* 2.3*   ProBNP (last 3 results) Recent Labs    11/22/24 1958  PROBNP 3,354.0*   Fasting Lipid Panel Recent Labs    12/21/24 0436  TRIG 116   Medications:    Scheduled Medications:  amiodarone   200 mg Per Tube BID   arformoterol   15 mcg Nebulization BID   Chlorhexidine  Gluconate Cloth  6 each Topical Daily   colchicine   0.6 mg Per Tube Daily   digoxin   0.125 mg Per Tube Daily   famotidine   20 mg Per Tube BID   feeding supplement (PROSource TF20)  60 mL Per Tube Daily   feeding supplement (VITAL 1.5 CAL)  1,000 mL Per Tube Q24H   folic acid   1 mg Per Tube Daily   free water   100 mL Per Tube Q2H   Gerhardt's butt cream   Topical TID   lidocaine   1 patch Transdermal Q24H   losartan   12.5 mg Per Tube Daily   multivitamin with minerals  1 tablet Per Tube Daily   mouth rinse  15 mL Mouth Rinse Q2H    pantoprazole  (PROTONIX ) IV  40 mg Intravenous Daily   polycarbophil  625 mg Per Tube Daily   QUEtiapine   25 mg Per Tube QHS   revefenacin   175 mcg Nebulization Daily   sodium chloride  flush  3 mL Intravenous Q12H   spironolactone   25 mg Per Tube Daily   thiamine   100 mg Per Tube Daily    Infusions:  sodium chloride  20 mL/hr at 12/17/24 1412   heparin  1,500 Units/hr (12/21/24 0600)   TPN ADULT (ION) 40 mL/hr at 12/21/24 0600    PRN Medications: sodium chloride , acetaminophen  (TYLENOL ) oral liquid 160 mg/5 mL, albuterol , hydrALAZINE , LORazepam , morphine  injection, ondansetron  (ZOFRAN ) IV, mouth rinse, oxyCODONE , polyethylene glycol, sodium chloride , sodium chloride  flush  Assessment/Plan   1.  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  Valvular Disease  - severe MR posteriorly directed, mod TR. Likely functional.  - severe AI>CMR - Severe AR/MR  - S/P bioprosthetic AVR/MVR - On heparin  gtt. Bridge to warfarin pending d/w Dr. Lucas, doubt will have goo absorption.   2. Acute hypoxic/hypercarbic respiratory failure - reintubated 12/21 due to hypercarbia  - suspect mostly respiratory muscle weakness at this point as the main limiting issue.  - s/p Trach 12/23 - weaning per CCM. SLP and attempt trach collar today.  3. Acute Systolic Heart Failure w/ Biventricular Dysfunction >> Cardiogenic Shock - HS trop not c/w ACS but EKG w/ anteroseptal Qs  - CMRI Severe AR/MR. LVEF 35%  - Cath- normal cors, RA5, PA 32/17 (24), PVR 2.57 Papi 3. CO 4.9 CI 3.8 - Post-op Echo 12/02/24: EF < 20% with severe LVH (new, ?inflammatory), moderately decreased RV function, stable bioprosthetic MV and AoV.  - Impella removed 12/24  - Ltd echo 12/28: EF 40-45%, mildly reduced RV - off DBA/pressors. Hemodynamics ok. Co-ox 89%. - I/Os net negative, no diuretic at this time with low CVP and high ostomy OP.   - Cor-trak placed, transition to PO meds - continue spironolactone  25 mg daily.  - increase losartan  25  mg daily. - continue digoxin  0.125 daily.  - can use IV hydral PRN for SBP > 150   4. Ischemic bowel - S/p exp/lap 12/11. Patchy ischemia of the distal ileum extending over about 75 cm without perforation. Ischemic segment resected.  Colon was distended with gas but viable. Small  bowel left in discontinuity.  - Back to OR 12/15 for ischemic R colon and mesenteric ischemia. S/p exp/lap, wound vac exchange, R colectomy without anastomosis.  - Returned to OR 12/16 for ileostomy creation.  - GSU following. Ileostomy working well - Cor-trak placed. Has now start TFs. Ending TPN today. - Still NPO, SLP eval today - Remains with high ostomy output - GI following  5. R hemothorax - Hemothorax s/p right thoracentesis with intercostal artery injury. Massive bleeding requiring multiple products and development of hemorrhagic shock, now improved. S/p IR embolization.  - S/p VATS 12/18 with right chest washout.  - H/H stable  - Chest tube in place  6.  ETOH Abuse - heavy drinker, at least 5 drinks per evening - may be contributing to CM, reduction of intake imperative  - No evidence of withdrawal   7 . DMII  - Hgb A1C 6.1 - On SSI.  8. ID - Remains with low grade fevers, Tm 99.6F - BAL w/ enterobacter cloacae and candida albicans. Zosyn  > meropenem  12/21, micafungin  started 12/22 - Cefepime  completed. - Micafungin  to stop 12/29.    9. AKI - AKI with shock.  - Resolved.  10. VT - Quiescent - continue amio 200 mg bid  11. Pericardial effusion - There is a moderate effusion without tamponade, noted again by echo 12/16.  Will need to follow over time.  - Limited echo 12/17 with moderate effusion.  - This was not drained at time of VATS/right hemothorax washout on 12/18. Appeared fibrinous. - Ltd echo 12/28 with increased, large pericardial effusion, no tamponade - Decrease colchicine  to 0.3 mg daily  12 Atrial flutter - Prev in/out of AFL with controlled rate.  - Remains in NSR.  Continue amio 200 mg bid - On heparin . No bleeding. Bridging to warfarin, with high ostomy output doubt absorption will be adequate. Will discuss with TCTS, may need to hold off.   Length of Stay: 28  CRITICAL CARE Performed by: Alice Vitelli  Total critical care time: 12 minutes  -Critical care time was exclusive of separately billable procedures and treating other patients. -Critical care was necessary to treat or prevent imminent or life-threatening deterioration. -Critical care was time spent personally by me on the following activities: development of treatment plan with patient and/or surrogate as well as nursing, discussions with consultants, evaluation of patient's response to treatment, examination of patient, obtaining history from patient or surrogate, ordering and performing treatments and interventions, ordering and review of laboratory studies, ordering and review of radiographic studies, pulse oximetry and re-evaluation of patient's condition.  Hira Trent, NP  12/21/2024, 7:05 AM  Advanced Heart Failure Team Pager [phone] (M-F; 7a - 5p)   Please visit [email or web address]: For overnight coverage please call cardiology fellow first. If fellow not available call Shock/ECMO MD on call.  For ECMO / Mechanical Support (Impella, IABP, LVAD) issues call Shock / ECMO MD on call.   "

## 2024-12-21 NOTE — Progress Notes (Signed)
 Physical Therapy Treatment Patient Details Name: Alejandro Lopez MRN: [id number] DOB: March 05, 1981 Today's Date: 12/21/2024   History of Present Illness The pt is a 43 yo male presenting 11/30 with swelling of LE, groin, and abdomen. Work up revealed acute HFrEF, severe MR, aortic insufficiency, and transaminates. 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 Extubated 12/9. S/p thoracentesis 12/11 with 1000cc's of blood drained from R pleural space, rapid recollection and pt then s/p R chest tube placed 12/11 S/p  coil embolization of right intercostal artery at level of right 10th rib, followed by ex lap with wound vac placement which showed patchy ischemia of ileum extending 75cm. Return to OR 12/14 with R colectomy and 12/16 for ostomy placement. To OR 12/18 for R VATS, washout, and pericardial window. Intubated 12/12-12/19, re-intubated 12/21, trach placed 12/23. PMH includes: HTN, alcohol use (5 drinks/day), tobacco use, but is limited by limited access to healthcare.    PT Comments  The pt is making great functional progress as he is now able to ambulate the halls. However, he is dependent on the [facility] walker for support, ambulates very slowly (indicative of being at risk for falls), and needs CGA for safety. He also is needing up to modA to perform bed mobility while maintain his sternal precautions and minA for transfers, demonstrating deficits in core and lower extremity strength and power. Will continue to follow acutely.     If plan is discharge home, recommend the following: Assistance with feeding;Assist for transportation;Help with stairs or ramp for entrance;A little help with walking and/or transfers;A lot of help with bathing/dressing/bathroom   Can travel by private vehicle        Equipment Recommendations  Rollator (4 wheels);Other (comment) (tub bench/shower chair)    Recommendations for Other Services       Precautions / Restrictions  Precautions Precautions: Fall;Sternal Precaution Booklet Issued: No Recall of Precautions/Restrictions: Intact Precaution/Restrictions Comments: Feeding tube, wound vac, ostomy Restrictions Weight Bearing Restrictions Per Provider Order: Yes Other Position/Activity Restrictions: sternal precautions     Mobility  Bed Mobility Overal bed mobility: Needs Assistance Bed Mobility: Rolling, Sidelying to Sit Rolling: Min assist Sidelying to sit: Mod assist, +2 for safety/equipment       General bed mobility comments: cues for log rolling to support sternal precautions. Pt min A for rolling to the right and mod A for trunk elevation. Cues for sternal precautions    Transfers Overall transfer level: Needs assistance Equipment used:  (EVA walker) Transfers: Sit to/from Stand Sit to Stand: Min assist, +2 safety/equipment           General transfer comment: Pt with good power up, min A for steadying assist and +2 for line management and safety    Ambulation/Gait Ambulation/Gait assistance: Contact guard assist, +2 safety/equipment Gait Distance (Feet): 315 Feet Assistive device: Elyn Finder Gait Pattern/deviations: Step-through pattern, Decreased step length - right, Decreased step length - left, Decreased stride length, Narrow base of support, Trunk flexed Gait velocity: reduced Gait velocity interpretation: <1.31 ft/sec, indicative of household ambulator   General Gait Details: Verbal and tactile cues provided to stand upright and widen stance repeatedly, fair carryover noted. No LOB, but pt walks very slowly with the [facility] walker for support, CGA for safety, +2 for line management.   Stairs             Wheelchair Mobility     Tilt Bed    Modified Rankin (Stroke Patients Only)  Balance Overall balance assessment: Needs assistance Sitting-balance support: Feet supported, No upper extremity supported Sitting balance-Leahy Scale: Fair     Standing balance  support: Bilateral upper extremity supported, Reliant on assistive device for balance Standing balance-Leahy Scale: Poor Standing balance comment: reliant on Eva walker for support when standing                            Communication Communication Communication: Impaired (but good attempts at mouthing and can write to clarify) Factors Affecting Communication: Trach/intubated  Cognition Arousal: Alert Behavior During Therapy: WFL for tasks assessed/performed   PT - Cognitive impairments: Difficult to assess Difficult to assess due to: Tracheostomy                     PT - Cognition Comments: suspect WFL at baseline with some safety awareness deficits -following commands, interacting appropriately and joking with staff Following commands: Intact      Cueing Cueing Techniques: Verbal cues, Visual cues, Tactile cues  Exercises      General Comments General comments (skin integrity, edema, etc.): VSS on trach collar 6L 40% FiO2, HR WFL, RR up to 34 with activity but SpO2 >90% throughout session. No noticeable secretions throughout session      Pertinent Vitals/Pain Pain Assessment Pain Assessment: Faces Faces Pain Scale: Hurts little more Pain Location: abdomen Pain Descriptors / Indicators: Cramping Pain Intervention(s): Limited activity within patient's tolerance, Monitored during session, Repositioned    Home Living                          Prior Function            PT Goals (current goals can now be found in the care plan section) Acute Rehab PT Goals Patient Stated Goal: to improve PT Goal Formulation: With patient Time For Goal Achievement: 12/28/24 Potential to Achieve Goals: Good Progress towards PT goals: Progressing toward goals    Frequency    Min 2X/week      PT Plan      Co-evaluation   Reason for Co-Treatment: For patient/therapist safety;To address functional/ADL transfers PT goals addressed during session:  Mobility/safety with mobility;Balance;Proper use of DME;Strengthening/ROM OT goals addressed during session: ADL's and self-care;Strengthening/ROM;Proper use of Adaptive equipment and DME      AM-PAC PT 6 Clicks Mobility   Outcome Measure  Help needed turning from your back to your side while in a flat bed without using bedrails?: A Little Help needed moving from lying on your back to sitting on the side of a flat bed without using bedrails?: A Lot Help needed moving to and from a bed to a chair (including a wheelchair)?: A Little Help needed standing up from a chair using your arms (e.g., wheelchair or bedside chair)?: A Little Help needed to walk in hospital room?: A Little Help needed climbing 3-5 steps with a railing? : Total 6 Click Score: 15    End of Session Equipment Utilized During Treatment: Oxygen  Activity Tolerance: Patient tolerated treatment well Patient left: in chair;with call bell/phone within reach;with nursing/sitter in room Nurse Communication: Mobility status (RN present throughout session) PT Visit Diagnosis: Unsteadiness on feet (R26.81);Muscle weakness (generalized) (M62.81);Other abnormalities of gait and mobility (R26.89);Difficulty in walking, not elsewhere classified (R26.2)     Time: 8878-8845 PT Time Calculation (min) (ACUTE ONLY): 33 min  Charges:    $Gait Training: 8-22 mins PT General Charges $$  ACUTE PT VISIT: 1 Visit                     Theo Ferretti, PT, DPT Acute Rehabilitation Services  Office: [phone]    Theo CHRISTELLA Ferretti 12/21/2024, 3:00 PM

## 2024-12-21 NOTE — Progress Notes (Signed)
 Pt cuff deflated and taken off the vent at this time without complication. Pt placed on aerosol trach collar 10L 40%. RT assisted speech therapist with bedside speaking valve.Pt tolerating well. MD provided with update. RT will continue to be available as needed.    12/21/24 0915  Therapy Vitals  Pulse Rate 98  Resp 20  Patient Position (if appropriate) Lying  Respiratory Assessment  Assessment Type Assess only  Respiratory Pattern Regular;Unlabored;Symmetrical  Chest Assessment Chest expansion symmetrical  Cough Strong  Sputum Amount Small  Sputum Color Clear  Sputum Consistency Thin  Sputum Specimen Source Oral  Bilateral Breath Sounds Clear;Diminished  Oxygen  Therapy/Pulse Ox  O2 Device (S)  Tracheostomy Collar  SpO2 99 %  O2 Therapy Oxygen  humidified  O2 Flow Rate (L/min) (S)  10 L/min  FiO2 (%) (S)  40 %  Tracheostomy Shiley Flexible 6 mm Cuffed  Placement Date/Time: 12/15/24 1143   Placed By: ICU physician  Brand: Shiley Flexible  Size (mm): 6 mm  Style: Cuffed  Status Secured with trach ties  Site Assessment Crusty  Site Care Cleansed;Dried;Open to air  Inner Cannula Care Changed/new  Ties Assessment Clean, Dry  Cuff Pressure (cm H2O)  (deflated)  Tracheostomy Equipment at bedside Yes and checklist posted at head of bed

## 2024-12-21 NOTE — Progress Notes (Signed)
 "  NAME:  Alejandro Lopez, MRN:  [id number], DOB:  Sep 18, 1981, LOS: 28 ADMISSION DATE:  11/22/2024, CONSULTATION DATE:  12/8 REFERRING MD:  Lucas, CHIEF COMPLAINT:  post cardiac surgery critical care services    History of Present Illness:  43 year old male with history of hypertension, alcohol and tobacco abuse, presented to the emergency room on 11/30 with approximately 1 month history of progressive dyspnea accompanied by lower extremity swelling extending up to the level of his scrotum and abdomen. Diagnostic evaluation by echocardiogram showed left ventricular ejection fraction 35 to 40% with grade 3 diastolic dysfunction this was further complicated by severe mitral valve regurgitation and aortic insufficiency because of this he was transferred to [facility] for further evaluation. He underwent cardiac catheterization on 12/4: Right heart hemodynamic parameters showed baseline right atrial pressure 5 mmHg, PA pressure 32/17, pulmonary capillary wedge pressure at 14 estimated Fick 4.9 L/min with cardiac index Fick calculated at 2.59 His PAPi was 3 Left heart cath was negative for coronary artery disease Went to OR 12/8 for MVR and AVR w/ impella insertion. PCCM asked to assist w/ post op care   OR course EBL: 1735 Received  Products: cryo 92ml, FFP 401 Also received DDAVP , 2200 crystalloid, 250ml albumin   Cell saver 1125 Pump time 4hrs Clamp time 2hrs 50 min  Events: multiple defibrillations intra-op  Intra-op ECHO EF estimated 40%  Pertinent  Medical History  Tobacco abuse, alcohol abuse, hypertension  Significant Hospital Events: Including procedures, antibiotic start and stop dates in addition to other pertinent events   11/30 admitted/. ECHO 35 to 40% with grade 3 diastolic dysfunction this was further complicated by severe mitral valve regurgitation and aortic insufficiency 12/4 left and right heart cath 12/8 AVR and MVR w/ bioprosthetic valves. Arrived on icu w/  Impella 5.5 MCS at flow 4 lpm and P 7. Received 2 more PLTs, 2 FFP and 1 cryo in first 6 hrs post op for cont blood loss/oozing. Required NE, epi and milrinone . Good flow on IMPELLA but minimal pulsatility  at P7. Placed on Amio gtt for VT 12/9 received 1 unit PRBC over night for hgb down to 7.5, hgb 8 getting second unit of blood. Still on P7 3.5 lPM. Extubated. 12/11 1L thora, later hemorrhagic shock and hemothorax> IR embolization. Also found to have ischemic bowel and underwent ex-lap with partial resection of ileum. 12/15 plan for ostomy tomorrow, transition epi to levophed   12/16 plan back to OR for ostomy placement  12/17 hemodynamically stable off pressors on Milrinone  0.25 , unable to tolerate SBT, plan for CT chest and heparin  gtt, high fevers>micafungin  12/18 VATS for decortication R chest 12/19 extubated 12/20-12/21 abx to meropenem , aspiration/ agitation/ weakness leading to CO2 retention, RV dysfunction and reintubation, L CVC removed; R CVC placed 12/22: Failed SBT d/t wob 12/23: Bedside tracheostomy  12/24 Impella 5 5 was removed in OR, spiked fever with Tmax 101.1, Precedex  stopped, remained off pressors on dobutamine  at 2.5 12/25 tolerated 1 hr weaning on vent, off DBA 12/26 foley & chest tubes out  Interim History / Subjective:  Overnight no acute events. Tolerated PS 12 on vent most of the day. Tmax 99.2.  Objective    Blood pressure 108/75, pulse 95, temperature 99 F (37.2 C), temperature source Oral, resp. rate 20, height 5' 10 (1.778 m), weight 75 kg, SpO2 100%.    Vent Mode: PRVC FiO2 (%):  [40 %] 40 % Set Rate:  [20 bmp] 20 bmp Vt Set:  [419  mL] 580 mL PEEP:  [5 cmH20] 5 cmH20 Pressure Support:  [10 cmH20-12 cmH20] 10 cmH20 Plateau Pressure:  [19 cmH20-21 cmH20] 21 cmH20   Intake/Output Summary (Last 24 hours) at 12/21/2024 0641 Last data filed at 12/21/2024 0600 Gross per 24 hour  Intake 3481.52 ml  Output 4090 ml  Net -608.48 ml   Filed Weights    12/18/24 0500 12/19/24 0500 12/21/24 0500  Weight: 75.3 kg 72.1 kg 75 kg    I/O -610cc Net -18L for admission   Physical exam: General: chronically ill appearing man lying in bed watching TV HEENT: [place]/AT, eyes anicteric Neck: trach in place, mild skin breakdown on lower R border of trach Neuro: awake and alert, moving all extremities, writing to ask questions. Able to move around in bed easily.  Chest: breathing comfortably on PS 10, mild rhonchi, wet-sounding cough Heart: S1S2, RRR  Abdomen: soft, NT Extremities: minimal muscle mass, no edema  Coox 89% BUN 15 Cr 0.48 AST 36 ALT 259 T bili 1.4 WBC 14 H/H 8.3/25 Platelets 608 Echo: LVEF 40-45%, global hypokinesis. Large pericaridal effusion w/o tamponade. Normal function of mitral and aortic prosthetic valves.  CXR personally reviewed> tracheostomy in place, chronic medial R pleural changes. Better aeration on the left.  Patient Lines/Drains/Airways Status     Active Line/Drains/Airways     Name Placement date Placement time Site Days   CVC Triple Lumen 12/13/24 Right Internal jugular 12/13/24  1600  -- 8   Negative Pressure Wound Therapy Abdomen Medial 12/10/24  0730  --  11   Ileostomy Standard (end) RLQ 12/08/24  1352  RLQ  13   External Urinary Catheter 12/19/24  1626  --  2   Small Bore Feeding Tube 10 Fr. Left nare Marking at nare/corner of mouth 63 cm 12/18/24  1105  Left nare  3   Tracheostomy Shiley Flexible 6 mm Cuffed 12/15/24  1143  6 mm  6   Wound 11/30/24 1512 Surgical Closed Surgical Incision Chest Other (Comment) 11/30/24  1512  Chest  21   Wound 11/30/24 1512 Surgical Closed Surgical Incision Chest Right 11/30/24  1512  Chest  21   Wound 12/05/24 1000 Pressure Injury Buttocks Mid Deep Tissue Pressure Injury - Purple or maroon localized area of discolored intact skin or blood-filled blister due to damage of underlying soft tissue from pressure and/or shear. 12/05/24  1000  Buttocks  16   Wound 12/05/24 1033   Back Left;Upper 12/05/24  1033  Back  16   Wound 12/08/24 1408 Surgical Closed Surgical Incision Abdomen Other (Comment) 12/08/24  1408  Abdomen  13            Resolved problem list  AKI Lactic acidosis, resolved Constipation Hyponatremia Right-sided hemothorax status post IR embolization 12/11, status post VATS 12/18 Acute septic encephalopathy, resolved Assessment and Plan  Acute biventricular HFrEF with cardiogenic shock status post Impella 5.5, removed on 12/24 Severe mitral regurgitation and aortic insufficiency status post bioprosthetic mitral valve and aortic valve replacement Nonsustained VT, no more episodes Moderate circumferential pericardial effusion> not amenable to drainage -stable off inotropic support - starting GDMT-losartan , spiro, dig - Continue amiodarone  200mg  BID.  Anticoagulation will be difficult- coumadin  is likely the safest choice -- consult ordered. Eliquis is absorbed in the distal small bowel, most of which has been resected. - con't heparin  until therapeutic on warfarin -colchicine  for pericardial effusion -Telemetry monitoring  Acute respiratory failure with hypoxia and hypercapnia status post tracheostomy -trach care per protocol -d/c  trach sutures on 12/30 -needs mepilex under tracheostomy -doing well with vent weaning; was able to start TCT today. May need to go back on vent overnight; he wants to try to stay off overnight tonight.  -VAP prevention protocol -PMV at bedside; only with supervision for now.   Sepsis with septic shock due to recurrent aspiration pneumonia -Enterobacter leukocyosis -complete course of micafungin  -CXR, UA today  Ischemic bowel s/p laparotomy with partial ileum resection end-ileostomy  Postop ileus, improving - Con't TF at goal. Increasing free water  -add imodium  & banatrol, con't fiber -now can drink when PMV is on; needs to drink fluids with electrolytes -completing TPN tonight -need to discuss vitamin  supplementation needs due to malabsorption in small bowel-- suspect he will at leaast need B12 injections -appreciate general surgery's management  Shock liver, mild transaminase elevation from TPN -completing TPN today  Hypertriglyceridemia due to propofol  infusion, now TPN - weaning TPN  H/o alcohol abuse -vitamins, recommend cessation  Anemia of critical illness and previous ABLA from hemothorax, operative blood loss -transfuse for Hb <7 or hemodynamically significant bleeding  Agitation> situational due to prolonged illness Debility -PT, OT, SLP -CIR consulted - Continue walking with assistance  Wife updated at bedside. Paperwork filled out for disability insurance.   DVT: heparin  gtt  GI: pepcid  Lines:  RIJ CVC> d/c after TPN and once able to get PIV today   sodium chloride  20 mL/hr at 12/17/24 1412   heparin  1,500 Units/hr (12/21/24 0600)   TPN ADULT (ION) 40 mL/hr at 12/21/24 0600   This patient is critically ill with multiple organ system failure which requires frequent high complexity decision making, assessment, support, evaluation, and titration of therapies. This was completed through the application of advanced monitoring technologies and extensive interpretation of multiple databases. During this encounter critical care time was devoted to patient care services described in this note for 46 minutes.  Leita SHAUNNA Gaskins, DO 12/21/2024 7:42 PM [facility]  For contact information, see Amion. If no response to pager, please call PCCM consult pager. After hours, 7PM- 7AM, please call Elink.  "

## 2024-12-22 DIAGNOSIS — I5021 Acute systolic (congestive) heart failure: Secondary | ICD-10-CM | POA: Diagnosis not present

## 2024-12-22 DIAGNOSIS — Z930 Tracheostomy status: Secondary | ICD-10-CM

## 2024-12-22 LAB — POCT I-STAT 7, (LYTES, BLD GAS, ICA,H+H)
Acid-Base Excess: 1 mmol/L (ref 0.0–2.0)
Bicarbonate: 24.4 mmol/L (ref 20.0–28.0)
Calcium, Ion: 1.24 mmol/L (ref 1.15–1.40)
HCT: 25 % — ABNORMAL LOW (ref 39.0–52.0)
Hemoglobin: 8.5 g/dL — ABNORMAL LOW (ref 13.0–17.0)
O2 Saturation: 98 %
Patient temperature: 99.4
Potassium: 4.4 mmol/L (ref 3.5–5.1)
Sodium: 133 mmol/L — ABNORMAL LOW (ref 135–145)
TCO2: 25 mmol/L (ref 22–32)
pCO2 arterial: 34.2 mmHg (ref 32–48)
pH, Arterial: 7.463 — ABNORMAL HIGH (ref 7.35–7.45)
pO2, Arterial: 104 mmHg (ref 83–108)

## 2024-12-22 LAB — BASIC METABOLIC PANEL WITH GFR
Anion gap: 9 (ref 5–15)
BUN: 15 mg/dL (ref 6–20)
CO2: 25 mmol/L (ref 22–32)
Calcium: 8.5 mg/dL — ABNORMAL LOW (ref 8.9–10.3)
Chloride: 103 mmol/L (ref 98–111)
Creatinine, Ser: 0.52 mg/dL — ABNORMAL LOW (ref 0.61–1.24)
GFR, Estimated: >60 mL/min
Glucose, Bld: 101 mg/dL — ABNORMAL HIGH (ref 70–99)
Potassium: 4.6 mmol/L (ref 3.5–5.1)
Sodium: 136 mmol/L (ref 135–145)

## 2024-12-22 LAB — HEPARIN LEVEL (UNFRACTIONATED)
Heparin Unfractionated: 0.19 [IU]/mL — ABNORMAL LOW (ref 0.30–0.70)
Heparin Unfractionated: 0.3 [IU]/mL (ref 0.30–0.70)

## 2024-12-22 LAB — CBC
HCT: 24 % — ABNORMAL LOW (ref 39.0–52.0)
Hemoglobin: 8 g/dL — ABNORMAL LOW (ref 13.0–17.0)
MCH: 29.7 pg (ref 26.0–34.0)
MCHC: 33.3 g/dL (ref 30.0–36.0)
MCV: 89.2 fL (ref 80.0–100.0)
Platelets: 606 K/uL — ABNORMAL HIGH (ref 150–400)
RBC: 2.69 MIL/uL — ABNORMAL LOW (ref 4.22–5.81)
RDW: 17.2 % — ABNORMAL HIGH (ref 11.5–15.5)
WBC: 14.9 K/uL — ABNORMAL HIGH (ref 4.0–10.5)
nRBC: 0.1 % (ref 0.0–0.2)

## 2024-12-22 LAB — PROTIME-INR
INR: 1.1 (ref 0.8–1.2)
Prothrombin Time: 14.9 s (ref 11.4–15.2)

## 2024-12-22 LAB — MAGNESIUM: Magnesium: 2 mg/dL (ref 1.7–2.4)

## 2024-12-22 LAB — ECHO TEE

## 2024-12-22 MED ORDER — WARFARIN SODIUM 2.5 MG PO TABS
2.5000 mg | ORAL_TABLET | Freq: Once | ORAL | Status: DC
  Filled 2024-12-22: qty 1

## 2024-12-22 MED ORDER — BANATROL TF EN LIQD
60.0000 mL | Freq: Four times a day (QID) | ENTERAL | Status: DC
  Administered 2024-12-22 – 2024-12-28 (×25): 60 mL
  Filled 2024-12-22 (×25): qty 60

## 2024-12-22 MED ORDER — WARFARIN SODIUM 2.5 MG PO TABS
2.5000 mg | ORAL_TABLET | Freq: Once | ORAL | Status: AC
  Administered 2024-12-22: 2.5 mg

## 2024-12-22 MED ORDER — CYANOCOBALAMIN 1000 MCG/ML IJ SOLN
1000.0000 ug | INTRAMUSCULAR | Status: DC
  Administered 2024-12-28: 1000 ug via INTRAMUSCULAR
  Filled 2024-12-22: qty 1

## 2024-12-22 MED ORDER — PROSOURCE TF20 ENFIT COMPATIBL EN LIQD
60.0000 mL | Freq: Two times a day (BID) | ENTERAL | Status: DC
  Administered 2024-12-22 – 2024-12-28 (×12): 60 mL
  Filled 2024-12-22 (×12): qty 60

## 2024-12-22 MED ORDER — DIPHENOXYLATE-ATROPINE 2.5-0.025 MG PO TABS
1.0000 | ORAL_TABLET | Freq: Three times a day (TID) | ORAL | Status: DC
  Administered 2024-12-22 (×3): 1 via ORAL
  Filled 2024-12-22 (×3): qty 1

## 2024-12-22 MED ORDER — WARFARIN - PHARMACIST DOSING INPATIENT: Freq: Every day | Status: DC

## 2024-12-22 NOTE — Progress Notes (Signed)
 ANTICOAGULATION CONSULT NOTE  Pharmacy Consult for heparin  Indication: bA/MVR  Allergies[1]  Patient Measurements: Height: 5' 10 (177.8 cm) Weight: 75 kg (165 lb 5.5 oz) IBW/kg (Calculated) : 73 Heparin  Dosing Weight: 70 kg   Vital Signs: Temp: 99.5 F (37.5 C) (12/29 2330) Temp Source: Oral (12/29 2330) BP: 93/82 (12/29 2200) Pulse Rate: 98 (12/29 2200)  Labs: Recent Labs    12/19/24 0508 12/19/24 0519 12/20/24 0440 12/20/24 1251 12/21/24 0436 12/21/24 0509 12/21/24 1537 12/21/24 2320  HGB 7.9*   < > 7.9*  --  8.3* 11.9*  --   --   HCT 24.1*   < > 24.3*  --  25.0* 35.0*  --   --   PLT 529*  --  593*  --  608*  --   --   --   HEPARINUNFRC <0.10*   < > <0.10* <0.10* <0.10*  --  0.13* 0.19*  CREATININE 0.43*  --   --  0.42* 0.48*  --   --   --    < > = values in this interval not displayed.    Estimated Creatinine Clearance: 120.4 mL/min (A) (by C-G formula based on SCr of 0.48 mg/dL (L)).  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  Postop course complicated by ischemic bowel s/p exlap 12/11 with partial small bowel resection, abdomen left open.  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   Per Dr. Lucas, plan for 3 months of OAC with dual valve replacement. Not on anticoagulation prior to admission.  Pharmacy consulted for heparin  dosing.  Impella 5.5 removed 12/24, heparin  held. Ok to resume 12/26 with low therapeutic goal and slow titrations.  12/30 AM update: Heparin  level remains sub-therapeutic at 0.19, but trending up   Goal of Therapy:  Heparin  level 0.3-0.5 units/ml Monitor platelets by anticoagulation protocol: Yes   Plan:  Increase heparin  to 1700 units/hr (no bolus, slow titration to goal).  Check 6-8 hour heparin  level Monitor daily HL, CBC, and for s/sx of bleeding   Lynwood Mckusick, PharmD,  BCPS Clinical Pharmacist Phone: [phone]          [1] No Known Allergies

## 2024-12-22 NOTE — Progress Notes (Signed)
 "  NAME:  Alejandro Lopez, MRN:  [id number], DOB:  [birth year], LOS: 29 ADMISSION DATE:  11/22/2024, CONSULTATION DATE:  12/8 REFERRING MD:  Lucas, CHIEF COMPLAINT:  post cardiac surgery critical care services    History of Present Illness:  43 year old male with history of hypertension, alcohol and tobacco abuse, presented to the emergency room on 11/30 with approximately 1 month history of progressive dyspnea accompanied by lower extremity swelling extending up to the level of his scrotum and abdomen. Diagnostic evaluation by echocardiogram showed left ventricular ejection fraction 35 to 40% with grade 3 diastolic dysfunction this was further complicated by severe mitral valve regurgitation and aortic insufficiency because of this he was transferred to [facility] for further evaluation. He underwent cardiac catheterization on 12/4: Right heart hemodynamic parameters showed baseline right atrial pressure 5 mmHg, PA pressure 32/17, pulmonary capillary wedge pressure at 14 estimated Fick 4.9 L/min with cardiac index Fick calculated at 2.59 His PAPi was 3 Left heart cath was negative for coronary artery disease Went to OR 12/8 for MVR and AVR w/ impella insertion. PCCM asked to assist w/ post op care   OR course EBL: 1735 Received  Products: cryo 92ml, FFP 401 Also received DDAVP , 2200 crystalloid, 250ml albumin   Cell saver 1125 Pump time 4hrs Clamp time 2hrs 50 min  Events: multiple defibrillations intra-op  Intra-op ECHO EF estimated 40%  Pertinent  Medical History  Tobacco abuse, alcohol abuse, hypertension  Significant Hospital Events: Including procedures, antibiotic start and stop dates in addition to other pertinent events   11/30 admitted/. ECHO 35 to 40% with grade 3 diastolic dysfunction this was further complicated by severe mitral valve regurgitation and aortic insufficiency 12/4 left and right heart cath 12/8 AVR and MVR w/ bioprosthetic valves. Arrived on icu w/  Impella 5.5 MCS at flow 4 lpm and P 7. Received 2 more PLTs, 2 FFP and 1 cryo in first 6 hrs post op for cont blood loss/oozing. Required NE, epi and milrinone . Good flow on IMPELLA but minimal pulsatility  at P7. Placed on Amio gtt for VT 12/9 received 1 unit PRBC over night for hgb down to 7.5, hgb 8 getting second unit of blood. Still on P7 3.5 lPM. Extubated. 12/11 1L thora, later hemorrhagic shock and hemothorax> IR embolization. Also found to have ischemic bowel and underwent ex-lap with partial resection of ileum. 12/15 plan for ostomy tomorrow, transition epi to levophed   12/16 plan back to OR for ostomy placement  12/17 hemodynamically stable off pressors on Milrinone  0.25 , unable to tolerate SBT, plan for CT chest and heparin  gtt, high fevers>micafungin  12/18 VATS for decortication R chest 12/19 extubated 12/20-12/21 abx to meropenem , aspiration/ agitation/ weakness leading to CO2 retention, RV dysfunction and reintubation, L CVC removed; R CVC placed 12/22: Failed SBT d/t wob 12/23: Bedside tracheostomy  12/24 Impella 5 5 was removed in OR, spiked fever with Tmax 101.1, Precedex  stopped, remained off pressors on dobutamine  at 2.5 12/25 tolerated 1 hr weaning on vent, off DBA 12/26 foley & chest tubes out  Interim History / Subjective:  Went back on the vent overnight due to some chest pain. Feels well. Did well with dinner last night.   Objective    Blood pressure 103/74, pulse 87, temperature 98.5 F (36.9 C), temperature source Oral, resp. rate (!) 21, height 5' 10 (1.778 m), weight 68.2 kg, SpO2 100%.    Vent Mode: PRVC FiO2 (%):  [35 %-72 %] 72 % Set Rate:  [20  bmp-52 bmp] 52 bmp Vt Set:  [580 mL] 580 mL PEEP:  [5 cmH20] 5 cmH20 Pressure Support:  [10 cmH20] 10 cmH20 Plateau Pressure:  [18 cmH20] 18 cmH20   Intake/Output Summary (Last 24 hours) at 12/22/2024 0659 Last data filed at 12/22/2024 0539 Gross per 24 hour  Intake 3870.15 ml  Output 2785 ml  Net 1085.15  ml   Filed Weights   12/19/24 0500 12/21/24 0500 12/22/24 0500  Weight: 72.1 kg 75 kg 68.2 kg    I/O +970cccc Net -17.2L for admission  Ostomy output 1975   Physical exam: General:  chronically ill appearing man lying in bed watching TV HEENT: [place]/AT, eyes anicteric Neck: tracheostomy in place Neuro: Awake, alert, answering questions appropriately. Strong cough. Speaking easily, stronger  Chest: breathing comfortably on PS 10, mild rhonchi, wet-sounding cough Heart: S1S2, RRR  Abdomen: soft, NT Extremities: minimal muscle mass, no edema  7.46/34/104/24 BUN 15 Cr 0.52   UA 0-5 WBC  WBC 14.9 H/H 8/24 Platelets 606   Patient Lines/Drains/Airways Status     Active Line/Drains/Airways     Name Placement date Placement time Site Days   Peripheral IV 12/21/24 20 G 1 Distal;Posterior;Right Forearm 12/21/24  2248  Forearm  1   CVC Triple Lumen 12/13/24 Right Internal jugular 12/13/24  1600  -- 9   Negative Pressure Wound Therapy Abdomen Medial 12/10/24  0730  --  12   Ileostomy Standard (end) RLQ 12/08/24  1352  RLQ  14   Small Bore Feeding Tube 10 Fr. Left nare Marking at nare/corner of mouth 63 cm 12/18/24  1105  Left nare  4   Tracheostomy Shiley Flexible 6 mm Cuffed 12/15/24  1143  6 mm  7   Wound 11/30/24 1512 Surgical Closed Surgical Incision Chest Other (Comment) 11/30/24  1512  Chest  22   Wound 11/30/24 1512 Surgical Closed Surgical Incision Chest Right 11/30/24  1512  Chest  22   Wound 12/05/24 1000 Pressure Injury Buttocks Mid Deep Tissue Pressure Injury - Purple or maroon localized area of discolored intact skin or blood-filled blister due to damage of underlying soft tissue from pressure and/or shear. 12/05/24  1000  Buttocks  17   Wound 12/05/24 1033  Back Left;Upper 12/05/24  1033  Back  17   Wound 12/08/24 1408 Surgical Closed Surgical Incision Abdomen Other (Comment) 12/08/24  1408  Abdomen  14            Resolved problem list  AKI Lactic acidosis,  resolved Constipation Hyponatremia Right-sided hemothorax status post IR embolization 12/11, status post VATS 12/18 Acute septic encephalopathy, resolved Postop ileus Assessment and Plan  Acute biventricular HFrEF with cardiogenic shock status post Impella 5.5, removed on 12/24 Severe mitral regurgitation and aortic insufficiency status post bioprosthetic mitral valve and aortic valve replacement Nonsustained VT, no more episodes Moderate circumferential pericardial effusion> not amenable to drainage -Is not stable for inotropic and mechanical cardiac support - Continue GDMT-losartan , spironolactone , digoxin  - Starting warfarin for 3 month course.  DOACs are not a reasonable option for him since levels are not able to be checked to ensure absorption is adequate. - Continue amiodarone  200 mg twice daily - starting GDMT-losartan , spiro, dig - Colchicine  - Telemetry monitoring - Monitor electrolytes and replete as needed  Acute respiratory failure with hypoxia and hypercapnia status post tracheostomy - Trach care per protocol, discontinue trach sutures today.  Place Mepilex under lower tracheal flange.  -Continue vent weaning efforts.  May be close to going  without the vent at night.  Doing well during the day - Continue working with PMV with SLP.  Sepsis with septic shock due to recurrent aspiration pneumonia -Enterobacter Leukocyosis; slowly uptrending. UA reassurring, CXR unchanged. Pericardial fluid collection does not seem remarkably different on echo. -trach aspirate culture sent today  Ischemic bowel s/p laparotomy with partial ileum resection end-ileostomy  -Continue around-the-clock tube feeds today -- need to see how much energy he has to eat/ how significant a contributor fatigue is going to be in determining his p.o. intake - Need to encourage high fluid intake, especially fluids with electrolytes. - Continue Imodium , Banatrol increased.  Continue fiber supplements.  Adding  Lomotil . -Continue free water  flushes -Strict I's/O - If he does well with p.o. intake today, will transition to nocturnal tube feeds. -Anticipate he will require vitamin B12 injections.  Left closely monitor fat-soluble vitamin levels. -Appreciate general surgery's management  Shock liver, mild transaminase elevation from TPN --Monitor off TPN  Hypertriglyceridemia due to propofol  infusion, now TPN -Monitor off TPN  H/o alcohol abuse -Vitamins, recommend total cessation  Anemia of critical illness and previous ABLA from hemothorax, operative blood loss - Transfuse for hemoglobin less than 7 or hemodynamically significant bleeding.  Agitation> situational due to prolonged illness> better Debility - PT, OT, SLP - CIR consulted - Continue walking   DVT: heparin  gtt , warfarin GI: pepcid  Lines:  RIJ CVC> d/c after 2nd PIV today   sodium chloride  20 mL/hr at 12/17/24 1412   heparin  1,700 Units/hr (12/22/24 0539)   This patient is critically ill with multiple organ system failure which requires frequent high complexity decision making, assessment, support, evaluation, and titration of therapies. This was completed through the application of advanced monitoring technologies and extensive interpretation of multiple databases. During this encounter critical care time was devoted to patient care services described in this note for 41 minutes.  Leita SHAUNNA Gaskins, DO 12/22/2024 4:12 PM [facility]  For contact information, see Amion. If no response to pager, please call PCCM consult pager. After hours, 7PM- 7AM, please call Elink.  "

## 2024-12-22 NOTE — Progress Notes (Signed)
 ANTICOAGULATION CONSULT NOTE  Pharmacy Consult for heparin  + warfarin Indication: bA/MVR  Allergies[1]  Patient Measurements: Height: 5' 10 (177.8 cm) Weight: 68.2 kg (150 lb 5.7 oz) IBW/kg (Calculated) : 73 Heparin  Dosing Weight: 70 kg   Vital Signs: Temp: 100.2 F (37.9 C) (12/30 1941) Temp Source: Oral (12/30 1941) BP: 109/77 (12/30 1900) Pulse Rate: 97 (12/30 1900)  Labs: Recent Labs    12/20/24 0440 12/20/24 1251 12/21/24 0436 12/21/24 0509 12/21/24 1537 12/21/24 2320 12/22/24 0354 12/22/24 0406 12/22/24 0704 12/22/24 0929 12/22/24 1938  HGB 7.9*  --  8.3* 11.9*  --   --  8.5* 8.0*  --   --   --   HCT 24.3*  --  25.0* 35.0*  --   --  25.0* 24.0*  --   --   --   PLT 593*  --  608*  --   --   --   --  606*  --   --   --   LABPROT  --   --   --   --   --   --   --   --   --  14.9  --   INR  --   --   --   --   --   --   --   --   --  1.1  --   HEPARINUNFRC <0.10* <0.10* <0.10*  --    < > 0.19*  --   --   --  0.19* 0.30  CREATININE  --  0.42* 0.48*  --   --   --   --   --  0.52*  --   --    < > = values in this interval not displayed.    Estimated Creatinine Clearance: 112.5 mL/min (A) (by C-G formula based on SCr of 0.52 mg/dL (L)).  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  Postop course complicated by ischemic bowel s/p exlap 12/11 with partial small bowel resection, abdomen left open.  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 Impella 5.5 removed 12/24.  Per Dr. Lucas, plan for 3 months of OAC with dual valve replacement. Not on anticoagulation prior to admission.  Pharmacy consulted for heparin  dosing.  Heparin  level came back low at 0.19, on 1700 units/hr. INR is 1.1. Hgb 8, plt 606. No s/sx of bleeding or infusion issues. Oral intake slowly advancing - off TPN and on TF at goal. Having a lot of  ileostomy output (-1.9 L/24 hr).  PM Update: Heparin  level now therapeutic at 0.3 after increasing to 1850 units/hr. Patient received PM Warfarin dose. No issues with infusion or bleeding per RN.   Goal of Therapy:  INR 2-3 Heparin  level 0.3-0.5 units/ml Monitor platelets by anticoagulation protocol: Yes   Plan:  Continue heparin  to 1850 units/hr (no bolus, slow titration to goal) Monitor daily HL, CBC, and for s/sx of bleeding   Harlene Boga, PharmD 12/22/2024 9:02 PM  Please check AMION for all [facility] Pharmacy phone numbers After 10:00 PM, call Main Pharmacy [phone]     [1] No Known Allergies

## 2024-12-22 NOTE — Progress Notes (Signed)
" °  Inpatient Rehabilitation Admissions Coordinator   Met with patient at bedside for rehab assessment. We discussed goals and expectations of a possible CIR admit. He lives with wife and three children who are 53, 55 and 45 years old. Wife does not work outside the home. I await further progress with trach collar and therapy to assist in determining his rehab needs. Please call me with any questions.   Heron Leavell, RN, MSN Rehab Admissions Coordinator [phone]   "

## 2024-12-22 NOTE — TOC Progression Note (Signed)
 Transition of Care [facility]) - Progression Note    Patient Details  Name: Alejandro Lopez MRN: [id number] Date of Birth: 1981/12/24  [facility]) CM/SW Contact  Arlana JINNY Nicholaus ISRAEL Phone Number: [phone] 12/22/2024, 11:17 AM  Clinical Narrative:   Per chart review, patient is not yet medically ready for dc. ICM will continue to follow.   HF CSW/CM will continue to follow and monitor for dc readiness.     Expected Discharge Plan: IP Rehab Facility Barriers to Discharge: Continued Medical Work up               Expected Discharge Plan and Services In-house Referral: Clinical Social Work Discharge Planning Services: CM Consult   Living arrangements for the past 2 months: Single Family Home                                       Social Drivers of Health (SDOH) Interventions SDOH Screenings   Food Insecurity: No Food Insecurity (11/22/2024)  Housing: Low Risk (11/22/2024)  Transportation Needs: No Transportation Needs (11/22/2024)  Utilities: Not At Risk (11/22/2024)  Social Connections: Unknown (05/06/2022)   Received from [facility]  Tobacco Use: High Risk (12/06/2024)    Readmission Risk Interventions     No data to display

## 2024-12-22 NOTE — Progress Notes (Addendum)
 ANTICOAGULATION CONSULT NOTE  Pharmacy Consult for heparin  + warfarin Indication: bA/MVR  Allergies[1]  Patient Measurements: Height: 5' 10 (177.8 cm) Weight: 68.2 kg (150 lb 5.7 oz) IBW/kg (Calculated) : 73 Heparin  Dosing Weight: 70 kg   Vital Signs: Temp: 98.5 F (36.9 C) (12/30 0400) Temp Source: Oral (12/30 0400) BP: 91/71 (12/30 0600) Pulse Rate: 89 (12/30 0700)  Labs: Recent Labs    12/20/24 0440 12/20/24 1251 12/21/24 0436 12/21/24 0509 12/21/24 1537 12/21/24 2320 12/22/24 0354 12/22/24 0406  HGB 7.9*  --  8.3* 11.9*  --   --  8.5* 8.0*  HCT 24.3*  --  25.0* 35.0*  --   --  25.0* 24.0*  PLT 593*  --  608*  --   --   --   --  606*  HEPARINUNFRC <0.10* <0.10* <0.10*  --  0.13* 0.19*  --   --   CREATININE  --  0.42* 0.48*  --   --   --   --   --     Estimated Creatinine Clearance: 112.5 mL/min (A) (by C-G formula based on SCr of 0.48 mg/dL (L)).  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  Postop course complicated by ischemic bowel s/p exlap 12/11 with partial small bowel resection, abdomen left open.  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 Impella 5.5 removed 12/24.  Per Dr. Lucas, plan for 3 months of OAC with dual valve replacement. Not on anticoagulation prior to admission.  Pharmacy consulted for heparin  dosing.  Heparin  level came back low at 0.19, on 1700 units/hr. INR is 1.1. Hgb 8, plt 606. No s/sx of bleeding or infusion issues. Oral intake slowly advancing - off TPN and on TF at goal. Having a lot of ileostomy output (-1.9 L/24 hr).  Goal of Therapy:  INR 2-3 Heparin  level 0.3-0.5 units/ml Monitor platelets by anticoagulation protocol: Yes   Plan:  Increase heparin  to 1850 units/hr (no bolus, slow titration to goal) Warfarin 2.5 tonight Check 6-8 hour heparin  level Monitor daily HL,  CBC, and for s/sx of bleeding   Thank you for allowing pharmacy to participate in this patient's care,  Suzen Sour, PharmD, BCCCP Clinical Pharmacist  Phone: [phone] 12/22/2024 7:20 AM  Please check AMION for all [facility] Pharmacy phone numbers After 10:00 PM, call Main Pharmacy [phone]     [1] No Known Allergies

## 2024-12-22 NOTE — Consult Note (Signed)
 WOC Nurse wound follow up Wound type: open surgical to mid-abd Measurement: 13 x 4 x 2.5 cm Wound bed: more red than last week, some adipose tissue, center of wound with valley Drainage serosanguinous drainage, no malodor Periwound: clean edges Dressing procedure/placement/frequency: VAC foam exchange Tues and Friday   Removed old NPWT of intact dressing and base of wound visualized, image taken Cleansed wound with normal saline x 2 Barrier strip placed adjacent to ostomy to create a barrier. Placed additional drape over sides of wound to prevent leakage Filled wound with  _0__ piece of Mepitel, ____0 piece of white foam, __01__ piece of black foam  Sealed NPWT dressing at Hg neg pressure Patient received IV pain medication per bedside nurse  Patient tolerated procedure well and no leak alarms noted   WOC nurse will continue to provide NPWT dressing changed due to the complexity of the dressing change.   12/22/24 Abd surgical site     WOC Nurse ostomy follow up Stoma type/location: Right lower abd, ileostomy  Appliance change was done Stomal assessment/size: 38 mm round, stoma is moist, red, viable, well-budded, no discoloration Peristomal assessment: intact plane, suturing intact Treatment options for stomal/peristomal skin: placed 1-piece soft convex, barrier ring Output  150 mls liquid brownish effluent, primary nurse aware Ostomy pouching: 1pc Education provided: holding off on teaching, patient will discharge to [facility]  Enrolled patient in [place] Secure Start Discharge program: No, not ready for discharge  If an alarm condition cannot be resolved after attempting resolutions listed in the troubleshooting guide, then contact [facility] at [phone]. Promptly notify the treating physician to obtain orders for their desired alternative dressing if the negative-therapy unit is going to be turned off or has been malfunctioning for 2 hours or more.   Sherrilyn Hals MSN RN  CWOCN WOC [facility]  [phone] (Available from 7-3 pm Mon-Friday)

## 2024-12-22 NOTE — Progress Notes (Signed)
 RT NOTE: CCM ordered to remove trach sutures. One suture on left side trach flange. Suture removed per order. Trach care performed, trach ties changed and secured, and mepilex placed under trach. Vitals are stable. RT will continue to monitor.

## 2024-12-22 NOTE — Progress Notes (Signed)
 RT NOTE: Sputum sample obtained at this time and sent to lab per order.

## 2024-12-22 NOTE — Progress Notes (Signed)
 Speech Language Pathology Treatment: Dysphagia;Passy Muir Speaking valve  Patient Details Name: Alejandro Lopez MRN: [id number] DOB: 01/10/1981 Today's Date: 12/22/2024 Time: 1334-1410 SLP Time Calculation (min) (ACUTE ONLY): 36 min  Assessment / Plan / Recommendation Clinical Impression  Pt was up in the chair with PMV donned, having just finished working with PT. He reports good tolerance of PMV when staff can supervise, and endorses no difficulty swallowing although he feels like he can only fit a small amount in his stomach at a time. With help from a mirror, SLP provided education about his PMV and trach, also demonstrating how to don/doff the valve. Pt returned the demonstration x2 with Mod I and reports feeling comfortable taking the valve on and off himself. Safety precautions were reviewed, including always checking cuff before placement, removal with any breathing difficulty, and removal for sleep. Valve was then left in place for pt to eat a small amount off his lunch tray, with no overt difficulty swallowing POs. Note that prior to POs, and again x1 after POs, pt had productive coughing, removing secretions via yankauer.   PLAN: Recommend that pt wear PMV throughout waking hours as tolerated with intermittent supervision. Pt can start to don/doff valve himself. This will give him more opportunities throughout the day to wear the valve for secretion management and communication. It will also give him more independence during PO intake.    HPI HPI: The pt is a 43 yo male presenting 11/30 with swelling of LE, groin, and abdomen. Work up revealed acute HFrEF, severe MR, aortic insufficiency, and transaminates. 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 Extubated 12/9. S/p thoracentesis 12/11 with 1000cc's of blood drained from R pleural space, rapid recollection and pt then s/p R chest tube placed 12/11 with 2.5L bloody output. S/p  coil embolization of right intercostal artery at  level of right 10th rib, followed by ex lap with wound vac placement which showed patchy ischemia of ileum extending 75cm. Return to OR 12/14 with R colectomy and 12/16 for ostomy placement. To OR 12/18 for R VATS, washout, and pericardial window. Intubated 12/12-12/19, re-intubated 12/21. Trach 12/23. PMH includes: HTN, alcohol use (5 drinks/day), tobacco use, but is limited by limited access to healthcare.      SLP Plan  Continue with current plan of care        Swallow Evaluation Recommendations   Recommendations: PO diet PO Diet Recommendation: Dysphagia 3 (Mechanical soft);Thin liquids (Level 0) Liquid Administration via: Cup;Straw Medication Administration: Whole meds with puree Supervision: Patient able to self-feed;Intermittent supervision/cueing for swallowing strategies Postural changes: Position pt fully upright for meals Oral care recommendations: Oral care BID (2x/day) Caregiver Recommendations: Have oral suction available     Recommendations         Patient may use Passy-Muir Speech Valve: During all waking hours (remove during sleep);During PO intake/meals PMSV Supervision: Intermittent MD: Please consider changing trach tube to : Smaller size;Cuffless          Dysphagia, pharyngeal phase (R13.13);Aphonia (R49.1)     Continue with current plan of care     Leita SAILOR., M.A. CCC-SLP Acute Rehabilitation Services Office: [phone]  Secure chat preferred   12/22/2024, 2:28 PM

## 2024-12-22 NOTE — Progress Notes (Signed)
 Pt taken off ATC and placed back on full support vent setting to rest for the night per CCM. Pt tolerating well

## 2024-12-22 NOTE — TOC Progression Note (Signed)
 Transition of Care [facility]) - Progression Note    Patient Details  Name: Alejandro Lopez MRN: [id number] Date of Birth: 1981-05-20  [facility]) CM/SW Contact  Justina Delcia Czar, RN Phone Number: [phone] 12/22/2024, 4:50 PM  Clinical Narrative:     Spoke to pt and states he does want IP rehab. CIR following for rehab. Wife at home to assist with care.   Chart reviewed for discharge readiness, patient not medically stable for d/c. Inpatient CM/CSW will continue to monitor pt's advancement through interdisciplinary progression rounds.   If new pt transition needs arise, MD please place a TOC consult.    Expected Discharge Plan: IP Rehab Facility Barriers to Discharge: Continued Medical Work up    Expected Discharge Plan and Services In-house Referral: Clinical Social Work Discharge Planning Services: CM Consult Post Acute Care Choice: IP Rehab Living arrangements for the past 2 months: Single Family Home                    Social Drivers of Health (SDOH) Interventions SDOH Screenings   Food Insecurity: No Food Insecurity (11/22/2024)  Housing: Low Risk (11/22/2024)  Transportation Needs: No Transportation Needs (11/22/2024)  Utilities: Not At Risk (11/22/2024)  Social Connections: Unknown (05/06/2022)   Received from [facility]  Tobacco Use: High Risk (12/06/2024)    Readmission Risk Interventions     No data to display

## 2024-12-22 NOTE — Progress Notes (Signed)
 Physical Therapy Treatment Patient Details Name: Alejandro Lopez MRN: [id number] DOB: [birth year] Today's Date: 12/22/2024   History of Present Illness The pt is a 43 yo male presenting 11/30 with swelling of LE, groin, and abdomen. Work up revealed acute HFrEF, severe MR, aortic insufficiency, and transaminates. 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 Extubated 12/9. S/p thoracentesis 12/11 with 1000cc's of blood drained from R pleural space, rapid recollection and pt then s/p R chest tube placed 12/11 S/p  coil embolization of right intercostal artery at level of right 10th rib, followed by ex lap with wound vac placement which showed patchy ischemia of ileum extending 75cm. Return to OR 12/14 with R colectomy and 12/16 for ostomy placement. To OR 12/18 for R VATS, washout, and pericardial window. Intubated 12/12-12/19, re-intubated 12/21, trach placed 12/23. PMH includes: HTN, alcohol use (5 drinks/day), tobacco use, but is limited by limited access to healthcare.    PT Comments  The pt was agreeable to session, eager to mobilize and ambulate. The pt tolerated mobility well on trach collar with PMSV, ambulating in hallway and completing standing exercises in room after ambulation with VSS. Pt continues to need min-modA to power up to standing while maintaining sternal precautions, and benefits from BUE support for dynamic balance. Will continue to benefit from skilled PT acutely to progress functional endurance and dynamic stability to wean from DME use. Continue to recommend intensive rehab after d/c to facilitate return to baseline independence.     If plan is discharge home, recommend the following: Assistance with feeding;Assist for transportation;Help with stairs or ramp for entrance;A little help with walking and/or transfers;A lot of help with bathing/dressing/bathroom   Can travel by private vehicle        Equipment Recommendations  Rollator (4 wheels);Other  (comment) (tub bench/shower chair)    Recommendations for Other Services       Precautions / Restrictions Precautions Precautions: Fall;Sternal Precaution Booklet Issued: No Recall of Precautions/Restrictions: Intact Precaution/Restrictions Comments: Feeding tube, wound vac, ostomy, 8L trach collar FiO2 35% Restrictions Weight Bearing Restrictions Per Provider Order: Yes Other Position/Activity Restrictions: sternal precautions     Mobility  Bed Mobility Overal bed mobility: Needs Assistance Bed Mobility: Rolling, Sidelying to Sit Rolling: Min assist Sidelying to sit: Min assist       General bed mobility comments: minA to assist with trunk, increased time and cues to maintain sternal precautions    Transfers Overall transfer level: Needs assistance Equipment used: None (EVA walker) Transfers: Sit to/from Stand Sit to Stand: Min assist, Mod assist           General transfer comment: good use of momentum to power up, dependent on min-modA as he fatigued to complete hip lift, improved eccentric lower with reps. completed x8 in session    Ambulation/Gait Ambulation/Gait assistance: Contact guard assist Gait Distance (Feet): 300 Feet Assistive device: Elyn Finder Gait Pattern/deviations: Step-through pattern, Decreased step length - right, Decreased step length - left, Decreased stride length, Narrow base of support, Trunk flexed Gait velocity: 0.55m/s Gait velocity interpretation: <1.31 ft/sec, indicative of household ambulator   General Gait Details: Verbal and tactile cues provided to stand upright and widen stance repeatedly, fair carryover noted. no overt LOB, but significantly slowed gait, VSS on 8L   Stairs             Wheelchair Mobility     Tilt Bed    Modified Rankin (Stroke Patients Only)  Balance Overall balance assessment: Needs assistance Sitting-balance support: Feet supported, No upper extremity supported Sitting balance-Leahy  Scale: Fair     Standing balance support: Bilateral upper extremity supported, Reliant on assistive device for balance Standing balance-Leahy Scale: Poor Standing balance comment: reliant on Eva walker for support when standing                            Communication Communication Communication: Impaired Factors Affecting Communication: Trach/intubated;Passey - Muir valve  Cognition Arousal: Alert Behavior During Therapy: WFL for tasks assessed/performed   PT - Cognitive impairments: Difficult to assess Difficult to assess due to: Tracheostomy                     PT - Cognition Comments: suspect WFL at baseline with some safety awareness deficits -following commands, interacting appropriately and joking with staff Following commands: Intact      Cueing Cueing Techniques: Verbal cues, Visual cues, Tactile cues  Exercises Other Exercises Other Exercises: sit-stand from recliner x5 Other Exercises: standing knee flexion x5 Other Exercises: standing marches x5 (stopped due to increased abdominal cramping)    General Comments General comments (skin integrity, edema, etc.): VSS on trach collar 8L FiO2 35% with PMSV      Pertinent Vitals/Pain Pain Assessment Pain Assessment: Faces Faces Pain Scale: Hurts little more Pain Location: abdomen Pain Descriptors / Indicators: Cramping Pain Intervention(s): Monitored during session, Limited activity within patient's tolerance    Home Living                          Prior Function            PT Goals (current goals can now be found in the care plan section) Acute Rehab PT Goals Patient Stated Goal: to improve PT Goal Formulation: With patient Time For Goal Achievement: 12/28/24 Potential to Achieve Goals: Good Progress towards PT goals: Progressing toward goals    Frequency    Min 2X/week      PT Plan      Co-evaluation              AM-PAC PT 6 Clicks Mobility   Outcome  Measure  Help needed turning from your back to your side while in a flat bed without using bedrails?: A Little Help needed moving from lying on your back to sitting on the side of a flat bed without using bedrails?: A Lot Help needed moving to and from a bed to a chair (including a wheelchair)?: A Little Help needed standing up from a chair using your arms (e.g., wheelchair or bedside chair)?: A Little Help needed to walk in hospital room?: A Little Help needed climbing 3-5 steps with a railing? : Total 6 Click Score: 15    End of Session Equipment Utilized During Treatment: Oxygen  Activity Tolerance: Patient tolerated treatment well Patient left: in chair;with call bell/phone within reach;with nursing/sitter in room Nurse Communication: Mobility status PT Visit Diagnosis: Unsteadiness on feet (R26.81);Muscle weakness (generalized) (M62.81);Other abnormalities of gait and mobility (R26.89);Difficulty in walking, not elsewhere classified (R26.2)     Time: 8746-8661 PT Time Calculation (min) (ACUTE ONLY): 45 min  Charges:    $Gait Training: 8-22 mins $Therapeutic Exercise: 23-37 mins PT General Charges $$ ACUTE PT VISIT: 1 Visit                     Izetta Call, PT, DPT  Acute Rehabilitation Department Office [phone] Secure Chat Communication Preferred   [name] 12/22/2024, 2:12 PM

## 2024-12-22 NOTE — Progress Notes (Signed)
 TCTS Evening Rounds:  Hemodynamically stable today. Tmax 99.5.  Has been on TC all day, ambulating, eating solid food.  Remains on heparin  drip. Coumadin  started tonight.  Continuing PT/OT/ST. Hopefully can get to CIR.

## 2024-12-22 NOTE — Progress Notes (Signed)
 "    Advanced Heart Failure Rounding Note  Cardiologist: None  Chief Complaint: Valvular Heart Disease & HFrEF Patient Profile   43 y.o. male w/ h/o heavy alcohol use (at least 5 drinks per evening) and h/o asthma admitted w/ acute systolic heart failure. Strong family history of cardiac disease: mother and maternal grandmother had heart failure and father with CAD. Admitted with acute HFrEF/cardiogenic shock. Echo w/ severe AR, MR and LV dysfunction.    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  12/9 VT/VF ? vagal episode. Extubated 12/11: S/p R thoracentesis with resultant hemothorax.  Chest tube placed. S/p R intercostal artery coil (IR). S/p exp lap for ischemic bowel resection, bowel left in discontinuity 12/15: Back to OR for ischemic R colon and mesenteric ischemia. S/p exp/lap, wound vac exchange, R colectomy without anastomosis.  12/16 Back to OR for small bowel resection and ileostomy  12/18 OR for VATS/hemothorax washout, pericardial window was not done.  12/19 extubated 12/21 Reintubated. Ileus 12/23 S/p Trach. Fever post procedure, Tmax 101. Abx switched to cefepime    12/24 Impella removed 12/25: DBA stopped. Diuretics held d/t low CVPs 12/26: Chest tube/foley out 12/29: Trach collar 12h and PMW. TPN stopped. Central line out.  Subjective:    Remains with high ostomy output 2L, low grade fevers, Tm 100.24F, and leukocytosis with upward trend 15k. He tolerated 12 hour yesterday on trach collar, even with ambulation. Working with PMW and now talking and eating. No complaints.    Objective:    Weight Range: 68.2 kg Body mass index is 21.57 kg/m.   Vital Signs:   Temp:  [97.5 F (36.4 C)-100.2 F (37.9 C)] 98.5 F (36.9 C) (12/30 0400) Pulse Rate:  [87-105] 87 (12/30 0400) Resp:  [0-37] 21 (12/30 0400) BP: (82-146)/(29-94) 103/74 (12/30 0400) SpO2:  [95 %-100 %] 100 % (12/30 0400) FiO2 (%):  [35 %-72 %] 72 % (12/30  0400) Weight:  [68.2 kg] 68.2 kg (12/30 0500) Last BM Date : 12/21/24  Weight change: Filed Weights   12/19/24 0500 12/21/24 0500 12/22/24 0500  Weight: 72.1 kg 75 kg 68.2 kg   Intake/Output:  Intake/Output Summary (Last 24 hours) at 12/22/2024 0656 Last data filed at 12/22/2024 0539 Gross per 24 hour  Intake 3870.15 ml  Output 2785 ml  Net 1085.15 ml    Physical Exam   General: Ill appearing. No distress  Cardiac: JVP difficult to assess. No murmurs  Extremities: Warm and dry.  No edema.  Neuro: A&O x3. Affect pleasant.   Telemetry   SR 80s (personally reviewed)  Labs    CBC Recent Labs    12/21/24 0436 12/21/24 0509 12/22/24 0354 12/22/24 0406  WBC 14.0*  --   --  14.9*  HGB 8.3*   < > 8.5* 8.0*  HCT 25.0*   < > 25.0* 24.0*  MCV 89.3  --   --  89.2  PLT 608*  --   --  606*   < > = values in this interval not displayed.   Basic Metabolic Panel Recent Labs    87/71/74 1251 12/21/24 0436 12/21/24 0509 12/22/24 0354 12/22/24 0406  NA 139 136 137 133*  --   K 4.7 4.7 4.4 4.4  --   CL 107 104  --   --   --   CO2 22 23  --   --   --   GLUCOSE 96 111*  --   --   --  BUN 16 15  --   --   --   CREATININE 0.42* 0.48*  --   --   --   CALCIUM  9.2 9.0  --   --   --   MG  --  1.7  --   --  2.0  PHOS 3.6 3.7  --   --   --    Liver Function Tests Recent Labs    12/20/24 0440 12/21/24 0436  AST 161* 136*  ALT 270* 259*  ALKPHOS 321* 351*  BILITOT 1.4* 1.4*  PROT 6.8 7.0  ALBUMIN  2.4* 2.3*   ProBNP (last 3 results) Recent Labs    11/22/24 1958  PROBNP 3,354.0*   Fasting Lipid Panel Recent Labs    12/21/24 0436  TRIG 116   Medications:    Scheduled Medications:  amiodarone   200 mg Per Tube BID   arformoterol   15 mcg Nebulization BID   Chlorhexidine  Gluconate Cloth  6 each Topical Daily   colchicine   0.3 mg Per Tube Daily   digoxin   0.125 mg Per Tube Daily   famotidine   20 mg Per Tube BID   feeding supplement (PROSource TF20)  60 mL Per  Tube Daily   feeding supplement (VITAL 1.5 CAL)  1,000 mL Per Tube Q24H   fiber supplement (BANATROL TF)  60 mL Per Tube TID   folic acid   1 mg Per Tube Daily   free water   150 mL Per Tube Q2H   Gerhardt's butt cream   Topical TID   lidocaine   1 patch Transdermal Q24H   loperamide  HCl  4 mg Per Tube Q6H   losartan   25 mg Per Tube Daily   multivitamin with minerals  1 tablet Per Tube Daily   mouth rinse  15 mL Mouth Rinse Q2H   pantoprazole  (PROTONIX ) IV  40 mg Intravenous Daily   polycarbophil  625 mg Per Tube Daily   QUEtiapine   25 mg Per Tube QHS   revefenacin   175 mcg Nebulization Daily   sodium chloride  flush  3 mL Intravenous Q12H   spironolactone   25 mg Per Tube Daily   thiamine   100 mg Per Tube Daily    Infusions:  sodium chloride  20 mL/hr at 12/17/24 1412   heparin  1,700 Units/hr (12/22/24 0539)    PRN Medications: sodium chloride , acetaminophen  (TYLENOL ) oral liquid 160 mg/5 mL, albuterol , hydrALAZINE , LORazepam , morphine  injection, ondansetron  (ZOFRAN ) IV, mouth rinse, oxyCODONE , polyethylene glycol, sodium chloride , sodium chloride  flush  Assessment/Plan   1.  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  Valvular Disease  - severe MR posteriorly directed, mod TR. Likely functional.  - severe AI>CMR - Severe AR/MR  - S/P bioprosthetic AVR/MVR 11/30/24 - On heparin  gtt. Start warfarin, suspect absorption will be an issue.   2. Acute hypoxic/hypercarbic respiratory failure - reintubated 12/21 due to hypercarbia  - suspect mostly respiratory muscle weakness at this point as the main limiting issue.  - s/p Trach 12/23 - weaning per CCM. On trach collar during the day. PMW for PO food/drink.   3. Acute Systolic Heart Failure w/ Biventricular Dysfunction >> Cardiogenic Shock - HS trop not c/w ACS but EKG w/ anteroseptal Qs  - CMRI Severe AR/MR. LVEF 35%  - Cath- normal cors, RA5, PA 32/17 (24), PVR 2.57 Papi 3. CO 4.9 CI 3.8 - Post-op Echo 12/02/24: EF < 20% with severe LVH  (new, ?inflammatory), moderately decreased RV function, stable bioprosthetic MV and AoV.  - Impella removed 12/24  - Ltd echo  12/28: EF 40-45%, mildly reduced RV - Hemodynamics stable. - Appears euvolemic, continue to watch I/Os closely. - continue spironolactone  25 mg daily - continue losartan  25 mg daily - continue digoxin  0.125 daily  4. Ischemic bowel - S/p exp/lap 12/11. Patchy ischemia of the distal ileum extending over about 75 cm without perforation. Ischemic segment resected.  Colon was distended with gas but viable. Small bowel left in discontinuity.  - Back to OR 12/15 for ischemic R colon and mesenteric ischemia. S/p exp/lap, wound vac exchange, R colectomy without anastomosis.  - Returned to OR 12/16 for ileostomy creation.  - GSU following. Ileostomy working well, high output. On lomotil .  - TPN off. On TF. Started PO intake 12/29.  5. R hemothorax - Hemothorax s/p right thoracentesis with intercostal artery injury. Massive bleeding requiring multiple products and development of hemorrhagic shock, now improved. S/p IR embolization.  - S/p VATS 12/18 with right chest washout.  - H/H stable. CT out.  6.  ETOH Abuse - heavy drinker, at least 5 drinks per evening - may be contributing to CM. Did not withdrawal - cessation needed at discharge   7 . DMII  - Hgb A1C 6.1 - On SSI.  8. ID - BAL w/ enterobacter cloacae and candida albicans. Zosyn  > meropenem  (12/21 to 12/23) - Completed cefepime  (12/23 to 12/26) - Completed micafungin  (12/22 to 12/29) - Remains with low grade fevers, Tm 100.20F. WBC 15k.  9. AKI - Resolved.  10. VT - Quiescent - continue amio 200 mg bid  11. Pericardial effusion - Echo 12/26 moderate effusion without tamponade - Limited echo 12/17 with moderate effusion.  - This was not drained at time of VATS/right hemothorax washout on 12/18. Appeared fibrinous. - Ltd echo 12/28 with increased, large pericardial effusion, no tamponade - continue  colchicine  to 0.3 mg daily  12 Atrial flutter - Prev in/out of AFL with controlled rate.  - Remains in NSR. Continue amio 200 mg bid - On heparin . No bleeding. Start warfarin, follow INR closely  Length of Stay: 29  CRITICAL CARE Performed by: Tavaughn Silguero  Total critical care time: 9 minutes  -Critical care time was exclusive of separately billable procedures and treating other patients. -Critical care was necessary to treat or prevent imminent or life-threatening deterioration. -Critical care was time spent personally by me on the following activities: development of treatment plan with patient and/or surrogate as well as nursing, discussions with consultants, evaluation of patient's response to treatment, examination of patient, obtaining history from patient or surrogate, ordering and performing treatments and interventions, ordering and review of laboratory studies, ordering and review of radiographic studies, pulse oximetry and re-evaluation of patient's condition.  Virgil Slinger, NP  12/22/2024, 6:56 AM  Advanced Heart Failure Team Pager [phone] (M-F; 7a - 5p)   Please visit [email or web address]: For overnight coverage please call cardiology fellow first. If fellow not available call Shock/ECMO MD on call.  For ECMO / Mechanical Support (Impella, IABP, LVAD) issues call Shock / ECMO MD on call.   "

## 2024-12-22 NOTE — Progress Notes (Signed)
 "  Trauma/Critical Care Follow Up Note  Subjective:    Overnight Issues:  Sitting up on trach collar  Denies abdominal pain Started on DYS3 diet yesterday and ate a small amt dinner.   afebrile Objective:  Vital signs for last 24 hours: Temp:  [98.2 F (36.8 C)-100.2 F (37.9 C)] 98.2 F (36.8 C) (12/30 0700) Pulse Rate:  [87-105] 87 (12/30 0847) Resp:  [0-37] 19 (12/30 0847) BP: (82-140)/(29-90) 91/69 (12/30 0700) SpO2:  [95 %-100 %] 95 % (12/30 0847) FiO2 (%):  [35 %-72 %] 35 % (12/30 0847) Weight:  [68.2 kg] 68.2 kg (12/30 0500)  Intake/Output from previous day: 12/29 0701 - 12/30 0700 In: 3986.2 [I.V.:782.1; NG/GT:3190; IV Piggyback:14.1] Out: 2785 [Urine:800; Drains:10; Stool:1975]  Intake/Output this shift: Total I/O In: 77 [I.V.:17; NG/GT:60] Out: -   Vent settings for last 24 hours: Vent Mode: PRVC FiO2 (%):  [35 %-72 %] 35 % Set Rate:  [20 bmp-52 bmp] 52 bmp Vt Set:  [580 mL] 580 mL PEEP:  [5 cmH20] 5 cmH20 Plateau Pressure:  [18 cmH20] 18 cmH20  Physical Exam:  Gen: comfortable, no distress Neuro: follows commands  HEENT: PERRL, trach in place Neck: supple CV: RRR Pulm: unlabored breathing on trach Abd: soft, NT, midline vac in place , ostomy productive (1950, thin) and mucoid stool from rectum GU: urine clear and yellow, +spontaneous voids Extr: wwp, no edema  Results for orders placed or performed during the hospital encounter of 11/22/24 (from the past 24 hours)  Heparin  level (unfractionated)     Status: Abnormal   Collection Time: 12/21/24  3:37 PM  Result Value Ref Range   Heparin  Unfractionated 0.13 (L) 0.30 - 0.70 IU/mL  Urinalysis, Routine w reflex microscopic -Urine, Clean Catch     Status: Abnormal   Collection Time: 12/21/24  4:10 PM  Result Value Ref Range   Color, Urine YELLOW YELLOW   APPearance CLEAR CLEAR   Specific Gravity, Urine 1.014 1.005 - 1.030   pH 5.0 5.0 - 8.0   Glucose, UA NEGATIVE NEGATIVE mg/dL   Hgb urine  dipstick SMALL (A) NEGATIVE   Bilirubin Urine NEGATIVE NEGATIVE   Ketones, ur NEGATIVE NEGATIVE mg/dL   Protein, ur NEGATIVE NEGATIVE mg/dL   Nitrite NEGATIVE NEGATIVE   Leukocytes,Ua NEGATIVE NEGATIVE   RBC / HPF 0-5 0 - 5 RBC/hpf   WBC, UA 0-5 0 - 5 WBC/hpf   Bacteria, UA NONE SEEN NONE SEEN   Squamous Epithelial / HPF 0-5 0 - 5 /HPF   Mucus PRESENT    Hyaline Casts, UA PRESENT   Heparin  level (unfractionated)     Status: Abnormal   Collection Time: 12/21/24 11:20 PM  Result Value Ref Range   Heparin  Unfractionated 0.19 (L) 0.30 - 0.70 IU/mL  I-STAT 7, (LYTES, BLD GAS, ICA, H+H)     Status: Abnormal   Collection Time: 12/22/24  3:54 AM  Result Value Ref Range   pH, Arterial 7.463 (H) 7.35 - 7.45   pCO2 arterial 34.2 32 - 48 mmHg   pO2, Arterial 104 83 - 108 mmHg   Bicarbonate 24.4 20.0 - 28.0 mmol/L   TCO2 25 22 - 32 mmol/L   O2 Saturation 98 %   Acid-Base Excess 1.0 0.0 - 2.0 mmol/L   Sodium 133 (L) 135 - 145 mmol/L   Potassium 4.4 3.5 - 5.1 mmol/L   Calcium , Ion 1.24 1.15 - 1.40 mmol/L   HCT 25.0 (L) 39.0 - 52.0 %   Hemoglobin 8.5 (L) 13.0 -  17.0 g/dL   Patient temperature 00.5 F    Collection site RADIAL, ALLEN'S TEST ACCEPTABLE    Drawn by RT    Sample type ARTERIAL   Magnesium      Status: None   Collection Time: 12/22/24  4:06 AM  Result Value Ref Range   Magnesium  2.0 1.7 - 2.4 mg/dL  CBC     Status: Abnormal   Collection Time: 12/22/24  4:06 AM  Result Value Ref Range   WBC 14.9 (H) 4.0 - 10.5 K/uL   RBC 2.69 (L) 4.22 - 5.81 MIL/uL   Hemoglobin 8.0 (L) 13.0 - 17.0 g/dL   HCT 75.9 (L) 60.9 - 47.9 %   MCV 89.2 80.0 - 100.0 fL   MCH 29.7 26.0 - 34.0 pg   MCHC 33.3 30.0 - 36.0 g/dL   RDW 82.7 (H) 88.4 - 84.4 %   Platelets 606 (H) 150 - 400 K/uL   nRBC 0.1 0.0 - 0.2 %  Basic metabolic panel     Status: Abnormal   Collection Time: 12/22/24  7:04 AM  Result Value Ref Range   Sodium 136 135 - 145 mmol/L   Potassium 4.6 3.5 - 5.1 mmol/L   Chloride 103 98 - 111  mmol/L   CO2 25 22 - 32 mmol/L   Glucose, Bld 101 (H) 70 - 99 mg/dL   BUN 15 6 - 20 mg/dL   Creatinine, Ser 9.47 (L) 0.61 - 1.24 mg/dL   Calcium  8.5 (L) 8.9 - 10.3 mg/dL   GFR, Estimated >39 >39 mL/min   Anion gap 9 5 - 15    Assessment & Plan:  LOS: 29 days   Additional comments:I reviewed the patient's new clinical lab test results.   and I reviewed the patients new imaging test results.    43 y/o M S/P AVR, MCR, Impella placement by Dr. Lucas 12/8   S/P right thoracentesis 12/11 with hemorrhage from intercostal -chest tube placed by TCTS.  S/p emergent IR embolizaton  R intercostal artery by Dr. Philip.    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 On banatrol TID, imodium  from 4 mg q 6h, add lomotil  2 mg TID. - woc following - vac changes twice weekly - TF @ goal and now on DYS 3 diet; transition to nocturnal TF  - Impella removal 12/24   FEN: NPO, IVF per primary, TPN, TF as above ID: cefepime , micafungin  completed; resp cx 12/18 with few C albican, BCx 12/17 neg, BAL 12/23 rare yeast VTE: heparin  gtt, transitioning to coumadin  per TCTS Foley: removed, spont voids Dispo: ICU   Almarie Pringle, [facility] Please see Amion for pager number during day hours 7:00am-4:30pm       12/22/2024  *Care during the described time interval was provided by me. I have reviewed this patient's available data, including medical history, events of note, physical examination and test results as part of my evaluation.    "

## 2024-12-22 NOTE — Plan of Care (Signed)
 " Problem: Education: Goal: Knowledge of General Education information will improve Description: Including pain rating scale, medication(s)/side effects and non-pharmacologic comfort measures Outcome: Progressing   Problem: Health Behavior/Discharge Planning: Goal: Ability to manage health-related needs will improve Outcome: Progressing   Problem: Clinical Measurements: Goal: Ability to maintain clinical measurements within normal limits will improve Outcome: Progressing Goal: Will remain free from infection Outcome: Progressing Goal: Diagnostic test results will improve Outcome: Progressing Goal: Respiratory complications will improve Outcome: Progressing Goal: Cardiovascular complication will be avoided Outcome: Progressing   Problem: Activity: Goal: Risk for activity intolerance will decrease Outcome: Progressing   Problem: Nutrition: Goal: Adequate nutrition will be maintained Outcome: Progressing   Problem: Coping: Goal: Level of anxiety will decrease Outcome: Progressing   Problem: Elimination: Goal: Will not experience complications related to bowel motility Outcome: Progressing Goal: Will not experience complications related to urinary retention Outcome: Progressing   Problem: Pain Managment: Goal: General experience of comfort will improve and/or be controlled Outcome: Progressing   Problem: Safety: Goal: Ability to remain free from injury will improve Outcome: Progressing   Problem: Skin Integrity: Goal: Risk for impaired skin integrity will decrease Outcome: Progressing   Problem: Education: Goal: Ability to demonstrate management of disease process will improve Outcome: Progressing Goal: Ability to verbalize understanding of medication therapies will improve Outcome: Progressing   Problem: Activity: Goal: Capacity to carry out activities will improve Outcome: Progressing   Problem: Cardiac: Goal: Ability to achieve and maintain adequate  cardiopulmonary perfusion will improve Outcome: Progressing   Problem: Education: Goal: Understanding of CV disease, CV risk reduction, and recovery process will improve Outcome: Progressing   Problem: Activity: Goal: Ability to return to baseline activity level will improve Outcome: Progressing   Problem: Cardiovascular: Goal: Ability to achieve and maintain adequate cardiovascular perfusion will improve Outcome: Progressing Goal: Vascular access site(s) Level 0-1 will be maintained Outcome: Progressing   Problem: Health Behavior/Discharge Planning: Goal: Ability to safely manage health-related needs after discharge will improve Outcome: Progressing   Problem: Education: Goal: Understanding of CV disease, CV risk reduction, and recovery process will improve Outcome: Progressing   Problem: Activity: Goal: Ability to return to baseline activity level will improve Outcome: Progressing   Problem: Cardiovascular: Goal: Ability to achieve and maintain adequate cardiovascular perfusion will improve Outcome: Progressing Goal: Vascular access site(s) Level 0-1 will be maintained Outcome: Progressing   Problem: Health Behavior/Discharge Planning: Goal: Ability to safely manage health-related needs after discharge will improve Outcome: Progressing   Problem: Education: Goal: Will demonstrate proper wound care and an understanding of methods to prevent future damage Outcome: Progressing Goal: Knowledge of disease or condition will improve Outcome: Progressing Goal: Knowledge of the prescribed therapeutic regimen will improve Outcome: Progressing   Problem: Activity: Goal: Risk for activity intolerance will decrease Outcome: Progressing   Problem: Cardiac: Goal: Will achieve and/or maintain hemodynamic stability Outcome: Progressing   Problem: Clinical Measurements: Goal: Postoperative complications will be avoided or minimized Outcome: Progressing   Problem:  Respiratory: Goal: Respiratory status will improve Outcome: Progressing   Problem: Skin Integrity: Goal: Wound healing without signs and symptoms of infection Outcome: Progressing Goal: Risk for impaired skin integrity will decrease Outcome: Progressing   Problem: Urinary Elimination: Goal: Ability to achieve and maintain adequate renal perfusion and functioning will improve Outcome: Progressing   Problem: Cardiac: Goal: Ability to achieve and maintain adequate cardiopulmonary perfusion will improve Outcome: Progressing Goal: Vascular access site(s) Level 0-1 will be maintained Outcome: Progressing   Problem: Fluid Volume: Goal: Ability to  achieve a balanced intake and output will improve Outcome: Progressing   Problem: Physical Regulation: Goal: Complications related to the disease process, condition or treatment will be avoided or minimized Outcome: Progressing   Problem: Respiratory: Goal: Will regain and/or maintain adequate ventilation Outcome: Progressing   "

## 2024-12-23 DIAGNOSIS — Z930 Tracheostomy status: Secondary | ICD-10-CM

## 2024-12-23 DIAGNOSIS — Z932 Ileostomy status: Secondary | ICD-10-CM

## 2024-12-23 DIAGNOSIS — R5381 Other malaise: Secondary | ICD-10-CM

## 2024-12-23 DIAGNOSIS — E43 Unspecified severe protein-calorie malnutrition: Secondary | ICD-10-CM

## 2024-12-23 LAB — BASIC METABOLIC PANEL WITH GFR
Anion gap: 8 (ref 5–15)
BUN: 17 mg/dL (ref 6–20)
CO2: 26 mmol/L (ref 22–32)
Calcium: 8.7 mg/dL — ABNORMAL LOW (ref 8.9–10.3)
Chloride: 98 mmol/L (ref 98–111)
Creatinine, Ser: 0.54 mg/dL — ABNORMAL LOW (ref 0.61–1.24)
GFR, Estimated: >60 mL/min
Glucose, Bld: 107 mg/dL — ABNORMAL HIGH (ref 70–99)
Potassium: 4.4 mmol/L (ref 3.5–5.1)
Sodium: 132 mmol/L — ABNORMAL LOW (ref 135–145)

## 2024-12-23 LAB — CBC
HCT: 26.7 % — ABNORMAL LOW (ref 39.0–52.0)
Hemoglobin: 8.8 g/dL — ABNORMAL LOW (ref 13.0–17.0)
MCH: 29.1 pg (ref 26.0–34.0)
MCHC: 33 g/dL (ref 30.0–36.0)
MCV: 88.4 fL (ref 80.0–100.0)
Platelets: 618 K/uL — ABNORMAL HIGH (ref 150–400)
RBC: 3.02 MIL/uL — ABNORMAL LOW (ref 4.22–5.81)
RDW: 16.9 % — ABNORMAL HIGH (ref 11.5–15.5)
WBC: 14.3 K/uL — ABNORMAL HIGH (ref 4.0–10.5)
nRBC: 0 % (ref 0.0–0.2)

## 2024-12-23 LAB — POCT I-STAT 7, (LYTES, BLD GAS, ICA,H+H)
Acid-Base Excess: 0 mmol/L (ref 0.0–2.0)
Bicarbonate: 23.7 mmol/L (ref 20.0–28.0)
Calcium, Ion: 1.24 mmol/L (ref 1.15–1.40)
HCT: 27 % — ABNORMAL LOW (ref 39.0–52.0)
Hemoglobin: 9.2 g/dL — ABNORMAL LOW (ref 13.0–17.0)
O2 Saturation: 95 %
Potassium: 4.3 mmol/L (ref 3.5–5.1)
Sodium: 134 mmol/L — ABNORMAL LOW (ref 135–145)
TCO2: 25 mmol/L (ref 22–32)
pCO2 arterial: 35.3 mmHg (ref 32–48)
pH, Arterial: 7.434 (ref 7.35–7.45)
pO2, Arterial: 74 mmHg — ABNORMAL LOW (ref 83–108)

## 2024-12-23 LAB — HEPARIN LEVEL (UNFRACTIONATED)
Heparin Unfractionated: 0.21 [IU]/mL — ABNORMAL LOW (ref 0.30–0.70)
Heparin Unfractionated: 0.3 [IU]/mL (ref 0.30–0.70)

## 2024-12-23 LAB — PROTIME-INR
INR: 1.1 (ref 0.8–1.2)
Prothrombin Time: 14.9 s (ref 11.4–15.2)

## 2024-12-23 LAB — MAGNESIUM: Magnesium: 1.4 mg/dL — ABNORMAL LOW (ref 1.7–2.4)

## 2024-12-23 MED ORDER — DIPHENOXYLATE-ATROPINE 2.5-0.025 MG PO TABS: 1.0000 | ORAL_TABLET | Freq: Three times a day (TID) | ORAL | Status: DC

## 2024-12-23 MED ORDER — PIPERACILLIN-TAZOBACTAM 3.375 G IVPB
3.3750 g | Freq: Three times a day (TID) | INTRAVENOUS | Status: DC
  Filled 2024-12-23: qty 50

## 2024-12-23 MED ORDER — OXYCODONE HCL 5 MG PO TABS
5.0000 mg | ORAL_TABLET | ORAL | Status: DC | PRN
  Administered 2024-12-23 – 2024-12-25 (×5): 10 mg
  Administered 2024-12-26 – 2024-12-27 (×2): 5 mg
  Administered 2024-12-28: 10 mg
  Filled 2024-12-23: qty 2
  Filled 2024-12-23: qty 1
  Filled 2024-12-23 (×2): qty 2
  Filled 2024-12-23: qty 1
  Filled 2024-12-23 (×3): qty 2

## 2024-12-23 MED ORDER — VANCOMYCIN HCL 1250 MG/250ML IV SOLN
1250.0000 mg | Freq: Two times a day (BID) | INTRAVENOUS | Status: DC
  Administered 2024-12-23 – 2024-12-24 (×3): 1250 mg via INTRAVENOUS
  Filled 2024-12-23 (×3): qty 250

## 2024-12-23 MED ORDER — DIPHENOXYLATE-ATROPINE 2.5-0.025 MG PO TABS
1.0000 | ORAL_TABLET | Freq: Four times a day (QID) | ORAL | Status: DC
  Administered 2024-12-23 – 2024-12-28 (×22): 1
  Filled 2024-12-23 (×22): qty 1

## 2024-12-23 MED ORDER — SIMETHICONE 40 MG/0.6ML PO SUSP
40.0000 mg | Freq: Four times a day (QID) | ORAL | Status: DC | PRN
  Administered 2024-12-23 – 2024-12-26 (×4): 40 mg
  Filled 2024-12-23 (×6): qty 0.6

## 2024-12-23 MED ORDER — WARFARIN SODIUM 2.5 MG PO TABS
2.5000 mg | ORAL_TABLET | Freq: Once | ORAL | Status: AC
  Administered 2024-12-23: 2.5 mg
  Filled 2024-12-23: qty 1

## 2024-12-23 MED ORDER — RELIZORB DEVI
1.0000 | Freq: Every day | Status: DC
  Administered 2024-12-24 – 2024-12-28 (×5): 1

## 2024-12-23 MED ORDER — RELIZORB DEVI
2.0000 | Freq: Every day | Status: DC
  Administered 2024-12-23 – 2024-12-28 (×6): 2

## 2024-12-23 MED ORDER — MAGNESIUM SULFATE 4 GM/100ML IV SOLN
4.0000 g | Freq: Once | INTRAVENOUS | Status: AC
  Administered 2024-12-23: 4 g via INTRAVENOUS
  Filled 2024-12-23: qty 100

## 2024-12-23 MED ORDER — VITAL 1.5 CAL PO LIQD
1000.0000 mL | ORAL | Status: DC
  Administered 2024-12-24 – 2024-12-28 (×3): 1000 mL
  Filled 2024-12-23: qty 1000

## 2024-12-23 MED ORDER — OXYCODONE HCL 5 MG PO TABS: 5.0000 mg | ORAL_TABLET | ORAL | Status: DC | PRN

## 2024-12-23 MED ORDER — THIAMINE MONONITRATE 100 MG PO TABS
100.0000 mg | ORAL_TABLET | Freq: Every day | ORAL | Status: DC
  Administered 2024-12-23 – 2024-12-26 (×4): 100 mg via ORAL
  Filled 2024-12-23 (×4): qty 1

## 2024-12-23 MED ORDER — FOLIC ACID 1 MG PO TABS
1.0000 mg | ORAL_TABLET | Freq: Every day | ORAL | Status: DC
  Administered 2024-12-23 – 2024-12-27 (×5): 1 mg via ORAL
  Filled 2024-12-23 (×5): qty 1

## 2024-12-23 MED ORDER — COLCHICINE 0.3 MG HALF TABLET
0.3000 mg | ORAL_TABLET | Freq: Every day | ORAL | Status: DC
  Administered 2024-12-23 – 2024-12-26 (×4): 0.3 mg via ORAL
  Filled 2024-12-23 (×4): qty 1

## 2024-12-23 MED ORDER — SODIUM CHLORIDE 0.9 % IV SOLN
2.0000 g | INTRAVENOUS | Status: DC
  Administered 2024-12-23 – 2024-12-28 (×6): 2 g via INTRAVENOUS
  Filled 2024-12-23 (×6): qty 20

## 2024-12-23 MED ORDER — SCOPOLAMINE 1 MG/3DAYS TD PT72
1.0000 | MEDICATED_PATCH | TRANSDERMAL | Status: DC
  Administered 2024-12-23 – 2024-12-26 (×2): 1 mg via TRANSDERMAL
  Filled 2024-12-23 (×2): qty 1

## 2024-12-23 MED ORDER — VITAL 1.5 CAL PO LIQD
1000.0000 mL | ORAL | Status: DC
  Administered 2024-12-23 – 2024-12-27 (×4): 1000 mL

## 2024-12-23 MED ORDER — VITAL 1.5 CAL PO LIQD
1000.0000 mL | ORAL | Status: AC
  Administered 2024-12-23: 1000 mL

## 2024-12-23 NOTE — Progress Notes (Signed)
 ANTICOAGULATION CONSULT NOTE  Pharmacy Consult for heparin  + warfarin Indication: bA/MVR  Allergies[1]  Patient Measurements: Height: 5' 10 (177.8 cm) Weight: 67.2 kg (148 lb 2.4 oz) IBW/kg (Calculated) : 73 Heparin  Dosing Weight: 70 kg   Vital Signs: Temp: 99.8 F (37.7 C) (12/31 0304) Temp Source: Oral (12/31 0304) BP: 103/75 (12/31 0700) Pulse Rate: 94 (12/31 0700)  Labs: Recent Labs    12/21/24 0436 12/21/24 0509 12/22/24 0406 12/22/24 0704 12/22/24 0929 12/22/24 1938 12/23/24 0213 12/23/24 0250 12/23/24 0812 12/23/24 1207  HGB 8.3*   < > 8.0*  --   --   --   --  9.2* 8.8*  --   HCT 25.0*   < > 24.0*  --   --   --   --  27.0* 26.7*  --   PLT 608*  --  606*  --   --   --   --   --  618*  --   LABPROT  --   --   --   --  14.9  --  14.9  --   --   --   INR  --   --   --   --  1.1  --  1.1  --   --   --   HEPARINUNFRC <0.10*   < >  --   --  0.19* 0.30 0.21*  --   --  0.30  CREATININE 0.48*  --   --  0.52*  --   --  0.54*  --   --   --    < > = values in this interval not displayed.    Estimated Creatinine Clearance: 110.8 mL/min (A) (by C-G formula based on SCr of 0.54 mg/dL (L)).  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  Postop course complicated by ischemic bowel s/p exlap 12/11 with partial small bowel resection, abdomen left open.  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 Impella 5.5 removed 12/24.  Per Dr. Lucas, plan for 3 months of OAC with dual valve replacement. Not on anticoagulation prior to admission.  Pharmacy consulted for heparin  and warfarin dosing.  INR 1.1 as expected, heparin  level therapeutic at 0.3, CBC stable.  Goal of Therapy:  INR 2-3 Heparin  level 0.3-0.5 units/ml Monitor platelets by anticoagulation protocol: Yes   Plan:  Heparin  to 2000 units/h Warfarin 2.5mg   tonight Daily INR, heparin  level, CBC  Alejandro Lopez, PharmD, BCPS, [facility] Clinical Pharmacist [phone] Please check AMION for all [facility] numbers 12/23/2024       [1] No Known Allergies

## 2024-12-23 NOTE — Progress Notes (Signed)
" ° °  Inpatient Rehabilitation Admissions Coordinator   Pain limiting ability to participate with OT today. I await further progress with therapy before pursuing Auth with insurance for possible [facility] admit. I will follow up on Friday.  Heron Leavell, RN, MSN Rehab Admissions Coordinator [phone] 12/23/2024 12:31 PM  "

## 2024-12-23 NOTE — Progress Notes (Signed)
 ANTICOAGULATION CONSULT NOTE  Pharmacy Consult for heparin  + warfarin Indication: bA/MVR  Allergies[1]  Patient Measurements: Height: 5' 10 (177.8 cm) Weight: 68.2 kg (150 lb 5.7 oz) IBW/kg (Calculated) : 73 Heparin  Dosing Weight: 70 kg   Vital Signs: Temp: 99.8 F (37.7 C) (12/31 0304) Temp Source: Oral (12/31 0304) BP: 97/64 (12/31 0000) Pulse Rate: 98 (12/31 0150)  Labs: Recent Labs    12/21/24 0436 12/21/24 0509 12/22/24 0354 12/22/24 0406 12/22/24 0704 12/22/24 0929 12/22/24 1938 12/23/24 0213 12/23/24 0250  HGB 8.3*   < > 8.5* 8.0*  --   --   --   --  9.2*  HCT 25.0*   < > 25.0* 24.0*  --   --   --   --  27.0*  PLT 608*  --   --  606*  --   --   --   --   --   LABPROT  --   --   --   --   --  14.9  --  14.9  --   INR  --   --   --   --   --  1.1  --  1.1  --   HEPARINUNFRC <0.10*   < >  --   --   --  0.19* 0.30 0.21*  --   CREATININE 0.48*  --   --   --  0.52*  --   --  0.54*  --    < > = values in this interval not displayed.    Estimated Creatinine Clearance: 112.5 mL/min (A) (by C-G formula based on SCr of 0.54 mg/dL (L)).  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  Postop course complicated by ischemic bowel s/p exlap 12/11 with partial small bowel resection, abdomen left open.  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 Impella 5.5 removed 12/24.  Per Dr. Lucas, plan for 3 months of OAC with dual valve replacement. Not on anticoagulation prior to admission.  Pharmacy consulted for heparin  dosing.  12/31 AM update:  Heparin  level sub-therapeutic  Warfarin started yesterday, INR this AM 1.1  Goal of Therapy:  INR 2-3 Heparin  level 0.3-0.5 units/ml Monitor platelets by anticoagulation protocol: Yes   Plan:  Inc heparin  to 1950 units/hr(no bolus, slow titration to goal) 8h HL Monitor daily HL,  CBC, and for s/sx of bleeding   Lynwood Mckusick, PharmD, BCPS Clinical Pharmacist Phone: [phone]       [1] No Known Allergies

## 2024-12-23 NOTE — Progress Notes (Signed)
 "    Advanced Heart Failure Rounding Note  Cardiologist: None  Chief Complaint: Valvular Heart Disease & HFrEF Patient Profile   43 y.o. male w/ h/o heavy alcohol use (at least 5 drinks per evening) and h/o asthma admitted w/ acute systolic heart failure. Strong family history of cardiac disease: mother and maternal grandmother had heart failure and father with CAD. Admitted with acute HFrEF/cardiogenic shock. Echo w/ severe AR, MR and LV dysfunction.    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  12/9 VT/VF ? vagal episode. Extubated 12/11: S/p R thoracentesis with resultant hemothorax.  Chest tube placed. S/p R intercostal artery coil (IR). S/p exp lap for ischemic bowel resection, bowel left in discontinuity 12/15: Back to OR for ischemic R colon and mesenteric ischemia. S/p exp/lap, wound vac exchange, R colectomy without anastomosis.  12/16 Back to OR for small bowel resection and ileostomy  12/18 OR for VATS/hemothorax washout, pericardial window was not done.  12/19 extubated 12/21 Reintubated. Ileus 12/23 S/p Trach. Fever post procedure, Tmax 101. Abx switched to cefepime    12/24 Impella removed 12/25: DBA stopped. Diuretics held d/t low CVPs 12/26: Chest tube/foley out 12/29: Trach collar 12h and PMW. TPN stopped. Central line out.  Subjective:    T max 100.2 F overnight.   On trach collar most of yesterday. Ambulated with PT and eating solid food. Notes some GI upset with attempts to eat.    Objective:    Weight Range: 67.2 kg Body mass index is 21.26 kg/m.   Vital Signs:   Temp:  [98.2 F (36.8 C)-100.2 F (37.9 C)] 99.8 F (37.7 C) (12/31 0304) Pulse Rate:  [86-101] 96 (12/31 0500) Resp:  [10-30] 26 (12/31 0500) BP: (88-125)/(61-84) 107/75 (12/31 0500) SpO2:  [95 %-100 %] 97 % (12/31 0500) FiO2 (%):  [28 %-35 %] 28 % (12/31 0400) Weight:  [67.2 kg] 67.2 kg (12/31 0500) Last BM Date : 12/22/24  Weight change: Filed  Weights   12/21/24 0500 12/22/24 0500 12/23/24 0500  Weight: 75 kg 68.2 kg 67.2 kg   Intake/Output:  Intake/Output Summary (Last 24 hours) at 12/23/2024 0650 Last data filed at 12/23/2024 0631 Gross per 24 hour  Intake 3325.1 ml  Output 2800 ml  Net 525.1 ml    Physical Exam   General:  Ill appearing HEENT: + trach Neck: JVP difficult to assess Cor: Regular rate & rhythm. No murmurs. Lungs: coarse Abdomen: nontender, + ostomy Extremities: no edema Neuro: alert & orientedx3. Affect pleasant   Telemetry    SR 90s  Labs    CBC Recent Labs    12/21/24 0436 12/21/24 0509 12/22/24 0406 12/23/24 0250  WBC 14.0*  --  14.9*  --   HGB 8.3*   < > 8.0* 9.2*  HCT 25.0*   < > 24.0* 27.0*  MCV 89.3  --  89.2  --   PLT 608*  --  606*  --    < > = values in this interval not displayed.   Basic Metabolic Panel Recent Labs    87/71/74 1251 12/20/24 1251 12/21/24 0436 12/21/24 0509 12/22/24 0406 12/22/24 0704 12/23/24 0213 12/23/24 0250  NA 139  --  136   < >  --  136 132* 134*  K 4.7  --  4.7   < >  --  4.6 4.4 4.3  CL 107  --  104  --   --  103 98  --  CO2 22  --  23  --   --  25 26  --   GLUCOSE 96  --  111*  --   --  101* 107*  --   BUN 16  --  15  --   --  15 17  --   CREATININE 0.42*  --  0.48*  --   --  0.52* 0.54*  --   CALCIUM  9.2  --  9.0  --   --  8.5* 8.7*  --   MG  --    < > 1.7  --  2.0  --  1.4*  --   PHOS 3.6  --  3.7  --   --   --   --   --    < > = values in this interval not displayed.   Liver Function Tests Recent Labs    12/21/24 0436  AST 136*  ALT 259*  ALKPHOS 351*  BILITOT 1.4*  PROT 7.0  ALBUMIN  2.3*   ProBNP (last 3 results) Recent Labs    11/22/24 1958  PROBNP 3,354.0*   Fasting Lipid Panel Recent Labs    12/21/24 0436  TRIG 116   Medications:    Scheduled Medications:  amiodarone   200 mg Per Tube BID   arformoterol   15 mcg Nebulization BID   Chlorhexidine  Gluconate Cloth  6 each Topical Daily   colchicine   0.3  mg Per Tube Daily   [START ON 12/28/2024] cyanocobalamin  1,000 mcg Intramuscular Weekly   digoxin   0.125 mg Per Tube Daily   diphenoxylate -atropine   1 tablet Oral TID   famotidine   20 mg Per Tube BID   feeding supplement (PROSource TF20)  60 mL Per Tube BID   feeding supplement (VITAL 1.5 CAL)  1,000 mL Per Tube Q24H   fiber supplement (BANATROL TF)  60 mL Per Tube QID   folic acid   1 mg Per Tube Daily   free water   150 mL Per Tube Q2H   Gerhardt's butt cream   Topical TID   lidocaine   1 patch Transdermal Q24H   loperamide  HCl  4 mg Per Tube Q6H   losartan   25 mg Per Tube Daily   multivitamin with minerals  1 tablet Per Tube Daily   mouth rinse  15 mL Mouth Rinse Q2H   pantoprazole  (PROTONIX ) IV  40 mg Intravenous Daily   polycarbophil  625 mg Per Tube Daily   QUEtiapine   25 mg Per Tube QHS   revefenacin   175 mcg Nebulization Daily   sodium chloride  flush  3 mL Intravenous Q12H   spironolactone   25 mg Per Tube Daily   thiamine   100 mg Per Tube Daily   Warfarin - Pharmacist Dosing Inpatient   Does not apply q1600    Infusions:  sodium chloride  20 mL/hr at 12/17/24 1412   heparin  1,950 Units/hr (12/23/24 0446)    PRN Medications: sodium chloride , acetaminophen  (TYLENOL ) oral liquid 160 mg/5 mL, albuterol , hydrALAZINE , LORazepam , morphine  injection, ondansetron  (ZOFRAN ) IV, mouth rinse, oxyCODONE , polyethylene glycol, sodium chloride , sodium chloride  flush  Assessment/Plan   1.  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  Valvular Disease  - severe MR posteriorly directed, mod TR. Likely functional.  - severe AI>CMR - Severe AR/MR  - S/P bioprosthetic AVR/MVR 11/30/24 - On heparin  gtt. Started warfarin, suspect absorption will be an issue.   2. Acute hypoxic/hypercarbic respiratory failure - reintubated 12/21 due to hypercarbia  - suspect mostly respiratory muscle weakness at this point  as the main limiting issue.  - s/p Trach 12/23 - weaning per CCM. On trach collar during the  day. PMV for PO food/drink.   3. Acute Systolic Heart Failure w/ Biventricular Dysfunction >> Cardiogenic Shock - HS trop not c/w ACS but EKG w/ anteroseptal Qs  - CMRI Severe AR/MR. LVEF 35%  - Cath- normal cors, RA5, PA 32/17 (24), PVR 2.57 Papi 3. CO 4.9 CI 3.8 - Post-op Echo 12/02/24: EF < 20% with severe LVH (new, ?inflammatory), moderately decreased RV function, stable bioprosthetic MV and AoV.  - Impella removed 12/24  - Ltd echo 12/28: EF 40-45%, mildly reduced RV - Hemodynamics stable. - Appears euvolemic, continue to watch I/Os closely. - continue spironolactone  25 mg daily - continue losartan  25 mg daily - continue digoxin  0.125 daily - No BP room to titrate further  4. Ischemic bowel - S/p exp/lap 12/11. Patchy ischemia of the distal ileum extending over about 75 cm without perforation. Ischemic segment resected.  Colon was distended with gas but viable. Small bowel left in discontinuity.  - Back to OR 12/15 for ischemic R colon and mesenteric ischemia. S/p exp/lap, wound vac exchange, R colectomy without anastomosis.  - Returned to OR 12/16 for ileostomy creation.  - GSU following. Ileostomy working well, continues w/ high output. On lomotil , banatrol and imodium  - TPN off. On TF. Now eating solid food.  5. R hemothorax - Hemothorax s/p right thoracentesis with intercostal artery injury. Massive bleeding requiring multiple products and development of hemorrhagic shock, now improved. S/p IR embolization.  - S/p VATS 12/18 with right chest washout.  - H/H stable. CT out.  6.  ETOH Abuse - heavy drinker, at least 5 drinks per evening - may be contributing to CM. Did not withdrawal - cessation needed at discharge   7 . DMII  - Hgb A1C 6.1 - On SSI.  8. ID - BAL w/ enterobacter cloacae and candida albicans. Zosyn  > meropenem  (12/21 to 12/23) - Completed cefepime  (12/23 to 12/26) - Completed micafungin  (12/22 to 12/29) - Remains with low grade fevers, Tm 100.2 F  overnight. CBC pending  9. AKI - Resolved.  10. VT - Quiescent - continue amio 200 mg bid  11. Pericardial effusion - Echo 12/26 moderate effusion without tamponade - Limited echo 12/17 with moderate effusion.  - This was not drained at time of VATS/right hemothorax washout on 12/18. Appeared fibrinous. - Ltd echo 12/28 with increased, large pericardial effusion, no tamponade - continue colchicine  to 0.3 mg daily  12 Atrial flutter - Prev in/out of AFL with controlled rate.  - Remains in NSR. Continue amio 200 mg bid - On heparin . No bleeding. Warfarin started 12/30.   CRITICAL CARE Performed by: COLLETTA SHAVER N   Total critical care time: 11 minutes  Critical care time was exclusive of separately billable procedures and treating other patients.  Critical care was necessary to treat or prevent imminent or life-threatening deterioration.  Critical care was time spent personally by me on the following activities: development of treatment plan with patient and/or surrogate as well as nursing, discussions with consultants, evaluation of patient's response to treatment, examination of patient, obtaining history from patient or surrogate, ordering and performing treatments and interventions, ordering and review of laboratory studies, ordering and review of radiographic studies, pulse oximetry and re-evaluation of patient's condition.    Length of Stay: 30    Jori Thrall N, PA-C  12/23/2024, 6:50 AM  Advanced Heart Failure Team Pager [phone] (M-F; 7a -  5p)   Please visit [email or web address]: For overnight coverage please call cardiology fellow first. If fellow not available call Shock/ECMO MD on call.  For ECMO / Mechanical Support (Impella, IABP, LVAD) issues call Shock / ECMO MD on call.   "

## 2024-12-23 NOTE — Progress Notes (Signed)
 Nutrition Follow-up  Pt continues with high ileostomy output, 1800 mL in 24 hours. Pt on Banatrol QID, Imodium  4 mg q 5 hours, Lomotil  2mg  QID. Pt also on Fibercon  MD adjusted TF today to provide lower rate during the day, higher rate in the evening but total volume for the day remains the same.   Vital 1.5 at 45 ml/hr x 12 hours followed by 75 ml/hr x 12 hours. Total volume 1440 mL in 24 hours  Pt on po diet but appetite remains poor. Pt with early satiety in addition to +bloating, abdominal discomfort. Concern that pt may be experiencing malabsorption (specifically fat malabsorption) which may be causing some of these symptoms.   INTERVENTION:   Continue TF as ordered  Continue to encourage po diet. Receiving Gatorade  Plan to start trial of RELiZORB today due to concern for possible malabsorption  Recommended RELiZORB regimen: each cartridge covers 500 ml of formula Total formula per 24 hr: 1440 ml Number of cartridges needed per day: 3 cartridges Timing of cartridge changes: 0500 (1 cartridge/s) and 1700 (2 cartridge/s)  RN educated on RELiZORB; discussed plan in detail with Dr. Gretta Ruddle is an immobilized lipase cartridge device intended for use with patients who are experiencing symptoms of fat malabsorption on continuous tube feeds. Symptoms of fat malabsorption include: diarrhea, bloating and gas, steatorrhea, weight loss, abdominal discomfort, and fatigue. Conditions that are related to fat malabsorption and may be indicative of needing RELiZORB include but not limited to: SBS, EPI, IBD, acute/chronic pancreatitis, pancreatic or GI cancers, trauma/critical care, or abdominal surgery.  Betsey Finger MS, RDN, LDN, CNSC Registered Dietitian 3 Clinical Nutrition RD Inpatient Contact Info in Amion

## 2024-12-23 NOTE — Progress Notes (Signed)
 OT Cancellation Note  Patient Details Name: Alejandro Lopez MRN: [id number] DOB: [birth year]   Cancelled Treatment:    Reason Eval/Treat Not Completed: Other (comment);Pain limiting ability to participate (Discussed with nursing. Pt with increased abdominal pain, limiting particiaption OOB at this time. will follow up at a later time.)  [place] 12/23/2024, 9:03 AM Kreg Sink, OT/L   Acute OT Clinical Specialist Acute Rehabilitation Services Pager [phone] Office [phone]

## 2024-12-23 NOTE — Progress Notes (Signed)
 "  NAME:  Alejandro Lopez, MRN:  [id number], DOB:  [birth year], LOS: 30 ADMISSION DATE:  11/22/2024, CONSULTATION DATE:  12/8 REFERRING MD:  Lucas, CHIEF COMPLAINT:  post cardiac surgery critical care services    History of Present Illness:  43 year old male with history of hypertension, alcohol and tobacco abuse, presented to the emergency room on 11/30 with approximately 1 month history of progressive dyspnea accompanied by lower extremity swelling extending up to the level of his scrotum and abdomen. Diagnostic evaluation by echocardiogram showed left ventricular ejection fraction 35 to 40% with grade 3 diastolic dysfunction this was further complicated by severe mitral valve regurgitation and aortic insufficiency because of this he was transferred to [facility] for further evaluation. He underwent cardiac catheterization on 12/4: Right heart hemodynamic parameters showed baseline right atrial pressure 5 mmHg, PA pressure 32/17, pulmonary capillary wedge pressure at 14 estimated Fick 4.9 L/min with cardiac index Fick calculated at 2.59 His PAPi was 3 Left heart cath was negative for coronary artery disease Went to OR 12/8 for MVR and AVR w/ impella insertion. PCCM asked to assist w/ post op care   OR course EBL: 1735 Received  Products: cryo 92ml, FFP 401 Also received DDAVP , 2200 crystalloid, 250ml albumin   Cell saver 1125 Pump time 4hrs Clamp time 2hrs 50 min  Events: multiple defibrillations intra-op  Intra-op ECHO EF estimated 40%  Pertinent  Medical History  Tobacco abuse, alcohol abuse, hypertension  Significant Hospital Events: Including procedures, antibiotic start and stop dates in addition to other pertinent events   11/30 admitted/. ECHO 35 to 40% with grade 3 diastolic dysfunction this was further complicated by severe mitral valve regurgitation and aortic insufficiency 12/4 left and right heart cath 12/8 AVR and MVR w/ bioprosthetic valves. Arrived on icu w/  Impella 5.5 MCS at flow 4 lpm and P 7. Received 2 more PLTs, 2 FFP and 1 cryo in first 6 hrs post op for cont blood loss/oozing. Required NE, epi and milrinone . Good flow on IMPELLA but minimal pulsatility  at P7. Placed on Amio gtt for VT 12/9 received 1 unit PRBC over night for hgb down to 7.5, hgb 8 getting second unit of blood. Still on P7 3.5 lPM. Extubated. 12/11 1L thora, later hemorrhagic shock and hemothorax> IR embolization. Also found to have ischemic bowel and underwent ex-lap with partial resection of ileum. 12/15 plan for ostomy tomorrow, transition epi to levophed   12/16 plan back to OR for ostomy placement  12/17 hemodynamically stable off pressors on Milrinone  0.25 , unable to tolerate SBT, plan for CT chest and heparin  gtt, high fevers>micafungin  12/18 VATS for decortication R chest 12/19 extubated 12/20-12/21 abx to meropenem , aspiration/ agitation/ weakness leading to CO2 retention, RV dysfunction and reintubation, L CVC removed; R CVC placed 12/22: Failed SBT d/t wob 12/23: Bedside tracheostomy  12/24 Impella 5 5 was removed in OR, spiked fever with Tmax 101.1, Precedex  stopped, remained off pressors on dobutamine  at 2.5 12/25 tolerated 1 hr weaning on vent, off DBA 12/26 foley & chest tubes out  Interim History / Subjective:  Overnight remained on TC. Tmax 100.2.  Objective    Blood pressure 107/75, pulse 96, temperature 99.8 F (37.7 C), temperature source Oral, resp. rate (!) 26, height 5' 10 (1.778 m), weight 67.2 kg, SpO2 97%.    FiO2 (%):  [28 %-35 %] 28 %   Intake/Output Summary (Last 24 hours) at 12/23/2024 0644 Last data filed at 12/23/2024 0631 Gross per 24 hour  Intake 3325.1 ml  Output 2800 ml  Net 525.1 ml   Filed Weights   12/21/24 0500 12/22/24 0500 12/23/24 0500  Weight: 75 kg 68.2 kg 67.2 kg   I/O +970cccc Net -17.2L for admission  Ostomy output 1550cc   Physical exam: General:  chronically ill appearing man lying in bed watching  TV HEENT: [place]/AT, eyes anicteric Neck: tracheostomy in place Neuro: Awake, alert, answering questions appropriately. Strong cough. Speaking easily, stronger  Chest: breathing comfortably on PS 10, mild rhonchi, wet-sounding cough Heart: S1S2, RRR  Abdomen: soft, NT Extremities: minimal muscle mass, no edema   BUN 17 Cr 0.54 INR 1.1 Sputum: many PMNs, few GPC, GPR    Patient Lines/Drains/Airways Status     Active Line/Drains/Airways     Name Placement date Placement time Site Days   Peripheral IV 12/21/24 20 G 1 Distal;Posterior;Right Forearm 12/21/24  2248  Forearm  2   Peripheral IV 12/22/24 22 G 1.75 Anterior;Left;Lateral Forearm 12/22/24  0800  Forearm  1   Negative Pressure Wound Therapy Abdomen Medial 12/10/24  0730  --  13   Ileostomy Standard (end) RLQ 12/08/24  1352  RLQ  15   Small Bore Feeding Tube 10 Fr. Left nare Marking at nare/corner of mouth 63 cm 12/18/24  1105  Left nare  5   Tracheostomy Shiley Flexible 6 mm Cuffed 12/15/24  1143  6 mm  8   Wound 11/30/24 1512 Surgical Closed Surgical Incision Chest Other (Comment) 11/30/24  1512  Chest  23   Wound 11/30/24 1512 Surgical Closed Surgical Incision Chest Right 11/30/24  1512  Chest  23   Wound 12/05/24 1000 Pressure Injury Buttocks Mid Deep Tissue Pressure Injury - Purple or maroon localized area of discolored intact skin or blood-filled blister due to damage of underlying soft tissue from pressure and/or shear. 12/05/24  1000  Buttocks  18   Wound 12/05/24 1033  Back Left;Upper 12/05/24  1033  Back  18   Wound 12/08/24 1408 Surgical Closed Surgical Incision Abdomen Other (Comment) 12/08/24  1408  Abdomen  15            Resolved problem list  AKI Lactic acidosis, resolved Constipation Hyponatremia Right-sided hemothorax status post IR embolization 12/11, status post VATS 12/18 Acute septic encephalopathy, resolved Postop ileus Assessment and Plan  Acute biventricular HFrEF with cardiogenic shock status  post Impella 5.5, removed on 12/24 Severe mitral regurgitation and aortic insufficiency status post bioprosthetic mitral valve and aortic valve replacement Nonsustained VT, no more episodes Moderate circumferential pericardial effusion> not amenable to drainage -stable off inotropes & MCS -Con't GDMT-- spiro, losartan , digoxin . No lasix  needed with high ostomy output -Warfarin for 3 month course.  DOACs are not a reasonable option for him since levels are not able to be checked to ensure absorption is adequate. - Amiodarone  200mg  BID  - colchicine  for pericardial effusion - tele monitoring -monitor electrolytes and replete as needed  Acute respiratory failure with hypoxia and hypercapnia status post tracheostomy - trach care per protocol; keep mepilex under lower flange -stayed off vent overnight; con't TC -before he leaves ICU he needs to have trach exchanged for cuffless -still has excessive secretions to consider capping trial; hopefully can in the coming days -start empiric antibiotics for pneumonia vs tracheitis due to persistent leukocytosis, ongoing abnormal CXR, and productive cough with borderline temps- MRSA nares plus ceftriaxone; will add vanc if mrsa postiive -con't using PMV  Sepsis with septic shock due to recurrent aspiration pneumonia -  Enterobacter Leukocyosis; slowly uptrending. UA reassurring, CXR unchanged. Pericardial fluid collection does not seem remarkably different on echo. -ceftriaxone  Ischemic bowel s/p laparotomy with partial ileum resection end-ileostomy  -titrate down TF during the day by 30% and increase nightly TF by 30% -con't fiber, banatrol, loperamide , imodium  -adding relizorb for presumed short gut -free water  flushes -encourage PO intake -strict I/O -appreciate general surgery's management -Anticipate he will require vitamin B12 injections.  Left closely monitor fat-soluble vitamin levels.  Shock liver, mild transaminase elevation from  TPN --monitor periodically off TPN  Hypertriglyceridemia due to propofol  infusion, now TPN -monitor off TPN  H/o alcohol abuse -Vitamins, recommend total cessation  Anemia of critical illness and previous ABLA from hemothorax, operative blood loss - transfuse for Hb <7 or hemodynamically significant bleeding  Agitation> situational due to prolonged illness> better Debility -CIR consulted -con't PT, OT, SLP -OOB mobility   DVT: heparin  gtt , warfarin GI: pepcid ; if he stays off the vent, can stop tomorrow Lines:  PIVs   sodium chloride  20 mL/hr at 12/17/24 1412   heparin  1,950 Units/hr (12/23/24 0446)     Leita SHAUNNA Gaskins, DO 12/23/2024 6:44 AM [facility]  For contact information, see Amion. If no response to pager, please call PCCM consult pager. After hours, 7PM- 7AM, please call Elink.  "

## 2024-12-23 NOTE — Plan of Care (Signed)
 " Problem: Education: Goal: Knowledge of General Education information will improve Description: Including pain rating scale, medication(s)/side effects and non-pharmacologic comfort measures Outcome: Progressing   Problem: Health Behavior/Discharge Planning: Goal: Ability to manage health-related needs will improve Outcome: Progressing   Problem: Clinical Measurements: Goal: Ability to maintain clinical measurements within normal limits will improve Outcome: Progressing Goal: Will remain free from infection Outcome: Progressing Goal: Diagnostic test results will improve Outcome: Progressing Goal: Respiratory complications will improve Outcome: Progressing Goal: Cardiovascular complication will be avoided Outcome: Progressing   Problem: Activity: Goal: Risk for activity intolerance will decrease Outcome: Progressing   Problem: Nutrition: Goal: Adequate nutrition will be maintained Outcome: Progressing   Problem: Coping: Goal: Level of anxiety will decrease Outcome: Progressing   Problem: Elimination: Goal: Will not experience complications related to bowel motility Outcome: Progressing Goal: Will not experience complications related to urinary retention Outcome: Progressing   Problem: Pain Managment: Goal: General experience of comfort will improve and/or be controlled Outcome: Progressing   Problem: Safety: Goal: Ability to remain free from injury will improve Outcome: Progressing   Problem: Skin Integrity: Goal: Risk for impaired skin integrity will decrease Outcome: Progressing   Problem: Education: Goal: Ability to demonstrate management of disease process will improve Outcome: Progressing Goal: Ability to verbalize understanding of medication therapies will improve Outcome: Progressing   Problem: Activity: Goal: Capacity to carry out activities will improve Outcome: Progressing   Problem: Cardiac: Goal: Ability to achieve and maintain adequate  cardiopulmonary perfusion will improve Outcome: Progressing   Problem: Education: Goal: Understanding of CV disease, CV risk reduction, and recovery process will improve Outcome: Progressing   Problem: Activity: Goal: Ability to return to baseline activity level will improve Outcome: Progressing   Problem: Cardiovascular: Goal: Ability to achieve and maintain adequate cardiovascular perfusion will improve Outcome: Progressing Goal: Vascular access site(s) Level 0-1 will be maintained Outcome: Progressing   Problem: Health Behavior/Discharge Planning: Goal: Ability to safely manage health-related needs after discharge will improve Outcome: Progressing   Problem: Education: Goal: Understanding of CV disease, CV risk reduction, and recovery process will improve Outcome: Progressing   Problem: Activity: Goal: Ability to return to baseline activity level will improve Outcome: Progressing   Problem: Cardiovascular: Goal: Ability to achieve and maintain adequate cardiovascular perfusion will improve Outcome: Progressing Goal: Vascular access site(s) Level 0-1 will be maintained Outcome: Progressing   Problem: Health Behavior/Discharge Planning: Goal: Ability to safely manage health-related needs after discharge will improve Outcome: Progressing   Problem: Education: Goal: Will demonstrate proper wound care and an understanding of methods to prevent future damage Outcome: Progressing Goal: Knowledge of disease or condition will improve Outcome: Progressing Goal: Knowledge of the prescribed therapeutic regimen will improve Outcome: Progressing   Problem: Activity: Goal: Risk for activity intolerance will decrease Outcome: Progressing   Problem: Cardiac: Goal: Will achieve and/or maintain hemodynamic stability Outcome: Progressing   Problem: Clinical Measurements: Goal: Postoperative complications will be avoided or minimized Outcome: Progressing   Problem:  Respiratory: Goal: Respiratory status will improve Outcome: Progressing   Problem: Skin Integrity: Goal: Wound healing without signs and symptoms of infection Outcome: Progressing Goal: Risk for impaired skin integrity will decrease Outcome: Progressing   Problem: Urinary Elimination: Goal: Ability to achieve and maintain adequate renal perfusion and functioning will improve Outcome: Progressing   Problem: Cardiac: Goal: Ability to achieve and maintain adequate cardiopulmonary perfusion will improve Outcome: Progressing Goal: Vascular access site(s) Level 0-1 will be maintained Outcome: Progressing   Problem: Fluid Volume: Goal: Ability to  achieve a balanced intake and output will improve Outcome: Progressing   Problem: Physical Regulation: Goal: Complications related to the disease process, condition or treatment will be avoided or minimized Outcome: Progressing   Problem: Respiratory: Goal: Will regain and/or maintain adequate ventilation Outcome: Progressing   "

## 2024-12-23 NOTE — Progress Notes (Signed)
 Patient refused CPT at this time due to feeling sick on his stomach.

## 2024-12-23 NOTE — Progress Notes (Signed)
 7 Days Post-Op Procedures (LRB): REMOVAL, CARDIAC ASSIST DEVICE, IMPELLA (Right) Subjective:  Feels ok. Has not been able to eat that much because he gets full quick and feels bloated. Ileostomy working normally with 1800 cc out yesterday.  Ambulating. Stayed off vent.  Objective: Vital signs in last 24 hours: Temp:  [98.5 F (36.9 C)-100.2 F (37.9 C)] 98.5 F (36.9 C) (12/31 1104) Pulse Rate:  [91-101] 96 (12/31 1400) Cardiac Rhythm: Normal sinus rhythm (12/31 0800) Resp:  [10-43] 43 (12/31 1400) BP: (83-117)/(54-82) 99/67 (12/31 1400) SpO2:  [95 %-99 %] 96 % (12/31 1400) FiO2 (%):  [28 %] 28 % (12/31 1121) Weight:  [67.2 kg] 67.2 kg (12/31 0500)  Hemodynamic parameters for last 24 hours:    Intake/Output from previous day: 12/30 0701 - 12/31 0700 In: 3557.5 [I.V.:447.5; NG/GT:3110] Out: 3350 [Urine:1550; Stool:1800] Intake/Output this shift: Total I/O In: 1561.2 [I.V.:136; NG/GT:958.5; IV Piggyback:466.8] Out: 1250 [Urine:850; Stool:400]  General appearance: alert and cooperative Neurologic: intact Heart: regular rate and rhythm Lungs: diminished breath sounds RLL Abdomen: soft, non-tender; bowel sounds normal, ileostomy pink. Extremities: warm, no edema Wound: chest incision and right axillary incision look good.  Lab Results: Recent Labs    12/22/24 0406 12/23/24 0250 12/23/24 0812  WBC 14.9*  --  14.3*  HGB 8.0* 9.2* 8.8*  HCT 24.0* 27.0* 26.7*  PLT 606*  --  618*   BMET:  Recent Labs    12/22/24 0704 12/23/24 0213 12/23/24 0250  NA 136 132* 134*  K 4.6 4.4 4.3  CL 103 98  --   CO2 25 26  --   GLUCOSE 101* 107*  --   BUN 15 17  --   CREATININE 0.52* 0.54*  --   CALCIUM  8.5* 8.7*  --     PT/INR:  Recent Labs    12/23/24 0213  LABPROT 14.9  INR 1.1   ABG    Component Value Date/Time   PHART 7.434 12/23/2024 0250   HCO3 23.7 12/23/2024 0250   TCO2 25 12/23/2024 0250   ACIDBASEDEF 2.0 12/19/2024 0519   O2SAT 95 12/23/2024 0250    CBG (last 3)  No results for input(s): GLUCAP in the last 72 hours.  Assessment/Plan:  S/P AVR/MVR with pericardial valves and Insertion of Impella 5.5 for postop support 11/30/24. S/P right thoracentesis 12/03/24 complicated by massive right hemothorax due to intercostal artery laceration embolized by IR.  S/P Ex lap, 75 cm ileal resection and wound vac by GS for ischemic distal ileum 12/04/24. S/P re-exploration abdomen and right colectomy without anastomosis for ischemia 12/06/24. S/P re-exploration abdomen, limited distal small bowel resection and end ileostomy 12/08/24. S/P right VATS for decortication and drainage of retained hemothorax 12/10/24. S/P Percutaneous trach by CCM on 12/15/24 S/P removal of Impella 5.5 on 12/16/24.  Hemodynamics stable in NSR.  Low grade fever to 100.2 last night and WBC 14.3 this am, 14.9 yesterday.  Tracheal aspirate shows few WBC, pred PMN's, few G+ cocci, and few G+ rods. Culture pending. Started on Vanc and Rocephin empirically.  UA negative.  CCM adjusting TF and bowel regimen.  Hopeful to get to CIR.    LOS: 30 days    Alejandro Lopez 12/23/2024

## 2024-12-23 NOTE — Progress Notes (Addendum)
 EVENING ROUNDS NOTE :     [address] [place] [employer] [address]             [phone]               7 Days Post-Op Procedures (LRB): REMOVAL, CARDIAC ASSIST DEVICE, IMPELLA (Right)   Total Length of Stay:  LOS: 30 days  Events:   No events Up to chair    BP 99/67   Pulse 91   Temp 98.5 F (36.9 C) (Oral)   Resp (!) 24   Ht 5' 10 (1.778 m)   Wt 67.2 kg   SpO2 96%   BMI 21.26 kg/m      FiO2 (%):  [28 %] 28 %   sodium chloride  20 mL/hr at 12/17/24 1412   cefTRIAXone (ROCEPHIN)  IV Stopped (12/23/24 0850)   [START ON 12/24/2024] feeding supplement (VITAL 1.5 CAL)     feeding supplement (VITAL 1.5 CAL)     heparin  2,000 Units/hr (12/23/24 1400)   vancomycin  Stopped (12/23/24 1243)    I/O last 3 completed shifts: In: 5384.8 [I.V.:654.8; NG/GT:4730] Out: 4125 [Urine:1850; Stool:2275]      Latest Ref Rng & Units 12/23/2024    8:12 AM 12/23/2024    2:50 AM 12/22/2024    4:06 AM  CBC  WBC 4.0 - 10.5 K/uL 14.3   14.9   Hemoglobin 13.0 - 17.0 g/dL 8.8  9.2  8.0   Hematocrit 39.0 - 52.0 % 26.7  27.0  24.0   Platelets 150 - 400 K/uL 618   606        Latest Ref Rng & Units 12/23/2024    2:50 AM 12/23/2024    2:13 AM 12/22/2024    7:04 AM  BMP  Glucose 70 - 99 mg/dL  892  898   BUN 6 - 20 mg/dL  17  15   Creatinine 9.38 - 1.24 mg/dL  9.45  9.47   Sodium 864 - 145 mmol/L 134  132  136   Potassium 3.5 - 5.1 mmol/L 4.3  4.4  4.6   Chloride 98 - 111 mmol/L  98  103   CO2 22 - 32 mmol/L  26  25   Calcium  8.9 - 10.3 mg/dL  8.7  8.5     ABG    Component Value Date/Time   PHART 7.434 12/23/2024 0250   PCO2ART 35.3 12/23/2024 0250   PO2ART 74 (L) 12/23/2024 0250   HCO3 23.7 12/23/2024 0250   TCO2 25 12/23/2024 0250   ACIDBASEDEF 2.0 12/19/2024 0519   O2SAT 95 12/23/2024 0250       Linnie Rayas, MD 12/23/2024 5:08 PM

## 2024-12-23 NOTE — Progress Notes (Signed)
 Pharmacy Antibiotic Note  Alejandro Lopez is a 43 y.o. male admitted on 11/22/2024 with pneumonia.  Pharmacy has been consulted for vancomycin  dosing. WBC mildly elevated, Cr <1.  Plan: Vancomycin  1250mg  IV q12h - est AUC 496 Check MRSA swab, if negative stop vancomycin   Height: 5' 10 (177.8 cm) Weight: 67.2 kg (148 lb 2.4 oz) IBW/kg (Calculated) : 73  Temp (24hrs), Avg:99.6 F (37.6 C), Min:98.8 F (37.1 C), Max:100.2 F (37.9 C)  Recent Labs  Lab 12/19/24 0508 12/20/24 0440 12/20/24 1251 12/21/24 0436 12/22/24 0406 12/22/24 0704 12/23/24 0213 12/23/24 0812  WBC 10.0 12.0*  --  14.0* 14.9*  --   --  14.3*  CREATININE 0.43*  --  0.42* 0.48*  --  0.52* 0.54*  --     Estimated Creatinine Clearance: 110.8 mL/min (A) (by C-G formula based on SCr of 0.54 mg/dL (L)).    Allergies[1]    Ozell Jamaica, PharmD, BCPS, [facility] Clinical Pharmacist [phone] Please check AMION for all [facility] numbers 12/23/2024     [1] No Known Allergies

## 2024-12-23 NOTE — Progress Notes (Signed)
 Occupational Therapy Treatment Patient Details Name: Alejandro Lopez MRN: [id number] DOB: Apr 26, 1981 Today's Date: 12/23/2024   History of present illness The pt is a 43 yo male presenting 11/30 with swelling of LE, groin, and abdomen. Work up revealed acute HFrEF, severe MR, aortic insufficiency, and transaminates. 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 Extubated 12/9. S/p thoracentesis 12/11 with 1000cc's of blood drained from R pleural space, rapid recollection and pt then s/p R chest tube placed 12/11 S/p  coil embolization of right intercostal artery at level of right 10th rib, followed by ex lap with wound vac placement which showed patchy ischemia of ileum extending 75cm. Return to OR 12/14 with R colectomy and 12/16 for ostomy placement. To OR 12/18 for R VATS, washout, and pericardial window. Intubated 12/12-12/19, re-intubated 12/21, trach placed 12/23. PMH includes: HTN, alcohol use (5 drinks/day), tobacco use, but is limited by limited access to healthcare.   OT comments  [name] is making excellent progress, requiring min A with mobility and Mod A with ADL tasks due to below listed deficits and adhering to sternal precautions. Pt asking appropriate questions regarding sternal precautions, management of his ostomy bag and expected length of time in rehab. Demonstrating good insight regarding lifestyle changes he plans to make in order to live a healthier life. At end of session, [name] rapped a song about his experience and became appropriately emotional. Continue to recommend intensive inpatient follow-up therapy, >3 hours/day to maximize functional level of independence with goal of DC home. Acute OT to follow.       If plan is discharge home, recommend the following:  A little help with walking and/or transfers;Assist for transportation;Help with stairs or ramp for entrance;A little help with bathing/dressing/bathroom   Equipment Recommendations  Tub/shower  seat    Recommendations for Other Services Rehab consult    Precautions / Restrictions Precautions Precautions: Fall;Sternal Recall of Precautions/Restrictions: Intact Precaution/Restrictions Comments: Feeding tube, wound vac, ostomy, 8L trach collar FiO2 28% Restrictions Other Position/Activity Restrictions: sternal precautions       Mobility Bed Mobility Overal bed mobility: Needs Assistance Bed Mobility: Supine to Sit Rolling: Contact guard assist              Transfers Overall transfer level: Needs assistance Equipment used: None Transfers: Sit to/from Stand, Bed to chair/wheelchair/BSC Sit to Stand: Min assist     Step pivot transfers: Min assist           Balance     Sitting balance-Leahy Scale: Good       Standing balance-Leahy Scale: Fair                             ADL either performed or assessed with clinical judgement   ADL Overall ADL's : Needs assistance/impaired Eating/Feeding: Set up   Grooming: Set up;Supervision/safety;Sitting   Upper Body Bathing: Minimal assistance;Sitting   Lower Body Bathing: Sit to/from stand;Moderate assistance   Upper Body Dressing : Sitting;Moderate assistance   Lower Body Dressing: Minimal assistance;Sit to/from stand   Toilet Transfer: Minimal assistance;Ambulation   Toileting- Clothing Manipulation and Hygiene: Maximal assistance Toileting - Clothing Manipulation Details (indicate cue type and reason): ostomy bag     Functional mobility during ADLs: Minimal assistance General ADL Comments: able to complete figure four positoinoing for LB ADL; asking questions regarding management of his ostomy    Extremity/Trunk Assessment Upper Extremity Assessment Upper Extremity Assessment: Generalized weakness (full BUE  AROM)   Lower Extremity Assessment Lower Extremity Assessment: Defer to PT evaluation        Vision       Perception     Praxis     Communication  Communication Communication: Impaired Factors Affecting Communication: Trach/intubated   Cognition Arousal: Alert Behavior During Therapy: WFL for tasks assessed/performed Cognition: No apparent impairments             OT - Cognition Comments: scored within normal limits on the SBT                 Following commands: Intact        Cueing      Exercises Other Exercises Other Exercises: 6 repetitive sit<>stand Other Exercises: dead bug in sitting - repeat 10 BLE Other Exercises: cervical rotation and lateral flexion x 10 each side Other Exercises: gentle shoulder rolls forward/backward    Shoulder Instructions       General Comments VSS on TC    Pertinent Vitals/ Pain       Pain Assessment Pain Assessment: No/denies pain  Home Living                                          Prior Functioning/Environment              Frequency  Min 2X/week        Progress Toward Goals  OT Goals(current goals can now be found in the care plan section)  Progress towards OT goals: Progressing toward goals  ADL Goals Pt Will Perform Upper Body Dressing: with set-up;sitting Pt Will Perform Lower Body Dressing: with supervision;sit to/from stand;with adaptive equipment Pt Will Transfer to Toilet: with supervision;ambulating Pt Will Perform Toileting - Clothing Manipulation and hygiene: with supervision;sit to/from stand  Plan      Co-evaluation                 AM-PAC OT 6 Clicks Daily Activity     Outcome Measure   Help from another person eating meals?: A Little Help from another person taking care of personal grooming?: A Little Help from another person toileting, which includes using toliet, bedpan, or urinal?: A Lot Help from another person bathing (including washing, rinsing, drying)?: A Lot Help from another person to put on and taking off regular upper body clothing?: A Lot Help from another person to put on and taking off  regular lower body clothing?: A Little 6 Click Score: 15    End of Session Equipment Utilized During Treatment: Oxygen   OT Visit Diagnosis: Unsteadiness on feet (R26.81);Other abnormalities of gait and mobility (R26.89);Muscle weakness (generalized) (M62.81);Other symptoms and signs involving the nervous system (R29.898);Other symptoms and signs involving cognitive function   Activity Tolerance Patient tolerated treatment well   Patient Left in chair;with call bell/phone within reach   Nurse Communication Mobility status        Time: 8352-8284 OT Time Calculation (min): 28 min  Charges: OT General Charges $OT Visit: 1 Visit OT Treatments $Self Care/Home Management : 8-22 mins $Therapeutic Activity: 8-22 mins  Kreg Sink, OT/L   Acute OT Clinical Specialist Acute Rehabilitation Services Pager [phone] Office [phone]   [facility] 12/23/2024, 5:35 PM

## 2024-12-23 NOTE — Progress Notes (Signed)
 "  Trauma/Critical Care Follow Up Note  Subjective:    Overnight Issues:  Reports abdominal pain that feels like constipation Did well on trach collar and walked in the hall yesterday WBC stable at 14  afebrile Objective:  Vital signs for last 24 hours: Temp:  [98.8 F (37.1 C)-100.2 F (37.9 C)] 99.6 F (37.6 C) (12/31 0816) Pulse Rate:  [86-101] 99 (12/31 1000) Resp:  [10-32] 30 (12/31 1000) BP: (83-124)/(54-84) 113/76 (12/31 1000) SpO2:  [95 %-99 %] 97 % (12/31 1000) FiO2 (%):  [28 %-35 %] 28 % (12/31 0916) Weight:  [67.2 kg] 67.2 kg (12/31 0500)  Intake/Output from previous day: 12/30 0701 - 12/31 0700 In: 3557.5 [I.V.:447.5; NG/GT:3110] Out: 3350 [Urine:1550; Stool:1800]  Intake/Output this shift: Total I/O In: 725 [I.V.:58; NG/GT:478.5; IV Piggyback:188.6] Out: -   Vent settings for last 24 hours: FiO2 (%):  [28 %-35 %] 28 %  Physical Exam:  Gen: comfortable, no distress Neuro: follows commands  HEENT: PERRL, trach in place Neck: supple CV: RRR Pulm: unlabored breathing on trach Abd: soft, NT, midline vac in place , ostomy productive (1800, some solid sediment now) GU: urine clear and yellow, +spontaneous voids Extr: wwp, no edema  Results for orders placed or performed during the hospital encounter of 11/22/24 (from the past 24 hours)  Heparin  level (unfractionated)     Status: None   Collection Time: 12/22/24  7:38 PM  Result Value Ref Range   Heparin  Unfractionated 0.30 0.30 - 0.70 IU/mL  Magnesium      Status: Abnormal   Collection Time: 12/23/24  2:13 AM  Result Value Ref Range   Magnesium  1.4 (L) 1.7 - 2.4 mg/dL  Basic metabolic panel     Status: Abnormal   Collection Time: 12/23/24  2:13 AM  Result Value Ref Range   Sodium 132 (L) 135 - 145 mmol/L   Potassium 4.4 3.5 - 5.1 mmol/L   Chloride 98 98 - 111 mmol/L   CO2 26 22 - 32 mmol/L   Glucose, Bld 107 (H) 70 - 99 mg/dL   BUN 17 6 - 20 mg/dL   Creatinine, Ser 9.45 (L) 0.61 - 1.24 mg/dL    Calcium  8.7 (L) 8.9 - 10.3 mg/dL   GFR, Estimated >39 >39 mL/min   Anion gap 8 5 - 15  Protime-INR     Status: None   Collection Time: 12/23/24  2:13 AM  Result Value Ref Range   Prothrombin Time 14.9 11.4 - 15.2 seconds   INR 1.1 0.8 - 1.2  Heparin  level (unfractionated)     Status: Abnormal   Collection Time: 12/23/24  2:13 AM  Result Value Ref Range   Heparin  Unfractionated 0.21 (L) 0.30 - 0.70 IU/mL  I-STAT 7, (LYTES, BLD GAS, ICA, H+H)     Status: Abnormal   Collection Time: 12/23/24  2:50 AM  Result Value Ref Range   pH, Arterial 7.434 7.35 - 7.45   pCO2 arterial 35.3 32 - 48 mmHg   pO2, Arterial 74 (L) 83 - 108 mmHg   Bicarbonate 23.7 20.0 - 28.0 mmol/L   TCO2 25 22 - 32 mmol/L   O2 Saturation 95 %   Acid-Base Excess 0.0 0.0 - 2.0 mmol/L   Sodium 134 (L) 135 - 145 mmol/L   Potassium 4.3 3.5 - 5.1 mmol/L   Calcium , Ion 1.24 1.15 - 1.40 mmol/L   HCT 27.0 (L) 39.0 - 52.0 %   Hemoglobin 9.2 (L) 13.0 - 17.0 g/dL   Collection site RADIAL,  ALLEN'S TEST ACCEPTABLE    Drawn by RT    Sample type ARTERIAL   CBC     Status: Abnormal   Collection Time: 12/23/24  8:12 AM  Result Value Ref Range   WBC 14.3 (H) 4.0 - 10.5 K/uL   RBC 3.02 (L) 4.22 - 5.81 MIL/uL   Hemoglobin 8.8 (L) 13.0 - 17.0 g/dL   HCT 73.2 (L) 60.9 - 47.9 %   MCV 88.4 80.0 - 100.0 fL   MCH 29.1 26.0 - 34.0 pg   MCHC 33.0 30.0 - 36.0 g/dL   RDW 83.0 (H) 88.4 - 84.4 %   Platelets 618 (H) 150 - 400 K/uL   nRBC 0.0 0.0 - 0.2 %    Assessment & Plan:  LOS: 30 days   Additional comments:I reviewed the patient's new clinical lab test results.   and I reviewed the patients new imaging test results.    43 y/o M S/P AVR, MCR, Impella placement by Dr. Lucas 12/8   S/P right thoracentesis 12/11 with hemorrhage from intercostal -chest tube placed by TCTS.  S/p emergent IR embolizaton  R intercostal artery by Dr. Philip.    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 On banatrol TID, imodium  from 4 mg q 6h, increase to lomotil  2 mg QID. - woc following - vac changes twice weekly - TF @ goal and now on DYS 3 diet; gradual transition to nocturnal TF per primary team, discussed with Dr. Gretta - Impella removal 12/24   FEN: NPO, IVF per primary, TPN, TF as above ID: cefepime , micafungin  completed; resp cx 12/18 with few C albican, BCx 12/17 neg, BAL 12/23 rare yeast; given WBC and some resp bacteria on gram stain, CCM starting 7 d abx today. VTE: heparin  gtt, transitioning to coumadin  per TCTS Foley: removed, spont voids Dispo: ICU, possible CIR soon, stable for D/C to CIR from CCS standpoint, we can follow ostomy and nutrition while there.   Almarie Pringle, PA-C [facility] Please see Amion for pager number during day hours 7:00am-4:30pm       12/23/2024  *Care during the described time interval was provided by me. I have reviewed this patient's available data, including medical history, events of note, physical examination and test results as part of my evaluation.    "

## 2024-12-24 DIAGNOSIS — E43 Unspecified severe protein-calorie malnutrition: Secondary | ICD-10-CM

## 2024-12-24 DIAGNOSIS — Z952 Presence of prosthetic heart valve: Secondary | ICD-10-CM

## 2024-12-24 LAB — CBC
HCT: 25.9 % — ABNORMAL LOW (ref 39.0–52.0)
Hemoglobin: 8.7 g/dL — ABNORMAL LOW (ref 13.0–17.0)
MCH: 29.9 pg (ref 26.0–34.0)
MCHC: 33.6 g/dL (ref 30.0–36.0)
MCV: 89 fL (ref 80.0–100.0)
Platelets: 563 K/uL — ABNORMAL HIGH (ref 150–400)
RBC: 2.91 MIL/uL — ABNORMAL LOW (ref 4.22–5.81)
RDW: 17 % — ABNORMAL HIGH (ref 11.5–15.5)
WBC: 11.4 K/uL — ABNORMAL HIGH (ref 4.0–10.5)
nRBC: 0 % (ref 0.0–0.2)

## 2024-12-24 LAB — BASIC METABOLIC PANEL WITH GFR
Anion gap: 7 (ref 5–15)
BUN: 15 mg/dL (ref 6–20)
CO2: 24 mmol/L (ref 22–32)
Calcium: 8.7 mg/dL — ABNORMAL LOW (ref 8.9–10.3)
Chloride: 99 mmol/L (ref 98–111)
Creatinine, Ser: 0.53 mg/dL — ABNORMAL LOW (ref 0.61–1.24)
GFR, Estimated: >60 mL/min
Glucose, Bld: 105 mg/dL — ABNORMAL HIGH (ref 70–99)
Potassium: 4.7 mmol/L (ref 3.5–5.1)
Sodium: 131 mmol/L — ABNORMAL LOW (ref 135–145)

## 2024-12-24 LAB — CULTURE, RESPIRATORY W GRAM STAIN: Culture: NORMAL

## 2024-12-24 LAB — POCT I-STAT 7, (LYTES, BLD GAS, ICA,H+H)
Acid-base deficit: 2 mmol/L (ref 0.0–2.0)
Bicarbonate: 21.8 mmol/L (ref 20.0–28.0)
Calcium, Ion: 1.23 mmol/L (ref 1.15–1.40)
HCT: 28 % — ABNORMAL LOW (ref 39.0–52.0)
Hemoglobin: 9.5 g/dL — ABNORMAL LOW (ref 13.0–17.0)
O2 Saturation: 91 %
Patient temperature: 98.6
Potassium: 4.8 mmol/L (ref 3.5–5.1)
Sodium: 133 mmol/L — ABNORMAL LOW (ref 135–145)
TCO2: 23 mmol/L (ref 22–32)
pCO2 arterial: 34.1 mmHg (ref 32–48)
pH, Arterial: 7.413 (ref 7.35–7.45)
pO2, Arterial: 59 mmHg — ABNORMAL LOW (ref 83–108)

## 2024-12-24 LAB — HEPARIN LEVEL (UNFRACTIONATED): Heparin Unfractionated: 0.37 [IU]/mL (ref 0.30–0.70)

## 2024-12-24 LAB — PROTIME-INR
INR: 1.2 (ref 0.8–1.2)
Prothrombin Time: 15.6 s — ABNORMAL HIGH (ref 11.4–15.2)

## 2024-12-24 LAB — MAGNESIUM: Magnesium: 1.8 mg/dL (ref 1.7–2.4)

## 2024-12-24 LAB — MRSA NEXT GEN BY PCR, NASAL: MRSA by PCR Next Gen: NOT DETECTED

## 2024-12-24 MED ORDER — WARFARIN SODIUM 5 MG PO TABS
5.0000 mg | ORAL_TABLET | Freq: Once | ORAL | Status: AC
  Administered 2024-12-24: 5 mg
  Filled 2024-12-24: qty 1

## 2024-12-24 MED ORDER — FREE WATER
100.0000 mL | Status: DC
  Administered 2024-12-24 – 2024-12-28 (×27): 100 mL

## 2024-12-24 NOTE — Progress Notes (Signed)
 "  Trauma/Critical Care Follow Up Note  Subjective:    Overnight Issues:  Sitting EOB, some intermittent  cramping abdominal pain. Feels urge to have a BM per rectum.  No vomiting. Reports early satiety, only eating a few bites of food at a time.  afebrile Objective:  Vital signs for last 24 hours: Temp:  [98.5 F (36.9 C)-99.7 F (37.6 C)] 99.7 F (37.6 C) (01/01 0700) Pulse Rate:  [91-103] 99 (01/01 0919) Resp:  [19-43] 28 (01/01 0723) BP: (86-113)/(57-83) 109/73 (01/01 0800) SpO2:  [91 %-100 %] 95 % (01/01 0800) FiO2 (%):  [28 %] 28 % (01/01 0800) Weight:  [64.6 kg] 64.6 kg (01/01 0500)  Intake/Output from previous day: 12/31 0701 - 01/01 0700 In: 3408 [I.V.:473.3; WH/HU:7531; IV Piggyback:466.8] Out: 2775 [Urine:1525; Stool:1250]  Intake/Output this shift: Total I/O In: 164.9 [I.V.:19.9; NG/GT:145] Out: 500 [Urine:200; Stool:300]  Vent settings for last 24 hours: FiO2 (%):  [28 %] 28 %  Physical Exam:  Gen: comfortable, no distress Neuro: follows commands  HEENT: PERRL, trach in place Neck: supple CV: RRR Pulm: unlabored breathing on trach Abd: soft, NT, midline vac in place , ostomy productive (1250, some solid sediment now) GU: urine clear and yellow, +spontaneous voids Extr: wwp, no edema  Results for orders placed or performed during the hospital encounter of 11/22/24 (from the past 24 hours)  Heparin  level (unfractionated)     Status: None   Collection Time: 12/23/24 12:07 PM  Result Value Ref Range   Heparin  Unfractionated 0.30 0.30 - 0.70 IU/mL  Magnesium      Status: None   Collection Time: 12/24/24  1:43 AM  Result Value Ref Range   Magnesium  1.8 1.7 - 2.4 mg/dL  Basic metabolic panel     Status: Abnormal   Collection Time: 12/24/24  1:43 AM  Result Value Ref Range   Sodium 131 (L) 135 - 145 mmol/L   Potassium 4.7 3.5 - 5.1 mmol/L   Chloride 99 98 - 111 mmol/L   CO2 24 22 - 32 mmol/L   Glucose, Bld 105 (H) 70 - 99 mg/dL   BUN 15 6 - 20 mg/dL    Creatinine, Ser 9.46 (L) 0.61 - 1.24 mg/dL   Calcium  8.7 (L) 8.9 - 10.3 mg/dL   GFR, Estimated >39 >39 mL/min   Anion gap 7 5 - 15  Protime-INR     Status: Abnormal   Collection Time: 12/24/24  1:43 AM  Result Value Ref Range   Prothrombin Time 15.6 (H) 11.4 - 15.2 seconds   INR 1.2 0.8 - 1.2  Heparin  level (unfractionated)     Status: None   Collection Time: 12/24/24  1:43 AM  Result Value Ref Range   Heparin  Unfractionated 0.37 0.30 - 0.70 IU/mL  CBC     Status: Abnormal   Collection Time: 12/24/24  1:43 AM  Result Value Ref Range   WBC 11.4 (H) 4.0 - 10.5 K/uL   RBC 2.91 (L) 4.22 - 5.81 MIL/uL   Hemoglobin 8.7 (L) 13.0 - 17.0 g/dL   HCT 74.0 (L) 60.9 - 47.9 %   MCV 89.0 80.0 - 100.0 fL   MCH 29.9 26.0 - 34.0 pg   MCHC 33.6 30.0 - 36.0 g/dL   RDW 82.9 (H) 88.4 - 84.4 %   Platelets 563 (H) 150 - 400 K/uL   nRBC 0.0 0.0 - 0.2 %  I-STAT 7, (LYTES, BLD GAS, ICA, H+H)     Status: Abnormal   Collection Time: 12/24/24  3:40 AM  Result Value Ref Range   pH, Arterial 7.413 7.35 - 7.45   pCO2 arterial 34.1 32 - 48 mmHg   pO2, Arterial 59 (L) 83 - 108 mmHg   Bicarbonate 21.8 20.0 - 28.0 mmol/L   TCO2 23 22 - 32 mmol/L   O2 Saturation 91 %   Acid-base deficit 2.0 0.0 - 2.0 mmol/L   Sodium 133 (L) 135 - 145 mmol/L   Potassium 4.8 3.5 - 5.1 mmol/L   Calcium , Ion 1.23 1.15 - 1.40 mmol/L   HCT 28.0 (L) 39.0 - 52.0 %   Hemoglobin 9.5 (L) 13.0 - 17.0 g/dL   Patient temperature 01.3 F    Collection site RADIAL, ALLEN'S TEST ACCEPTABLE    Drawn by RT    Sample type ARTERIAL     Assessment & Plan:  LOS: 31 days   Additional comments:I reviewed the patient's new clinical lab test results.   and I reviewed the patients new imaging test results.    44 y/o M S/P AVR, MCR, Impella placement by Dr. Lucas 12/8   S/P right thoracentesis 12/11 with hemorrhage from intercostal -chest tube placed by TCTS.  S/p emergent IR embolizaton  R intercostal artery by Dr. Philip.    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 On banatrol TID, imodium  from 4 mg q 6h, lomotil  2 mg QID. CCM added relizorb yesterday. Continue current regimen.  - woc following - vac changes twice weekly - TF @ goal and now on DYS 3 diet; gradual transition to nocturnal TF per primary team, discussed with Dr. Gretta - Impella removal 12/24   FEN: DYS 3, IVF/free water  per primary,  TF as above ID: cefepime , micafungin  completed; resp cx 12/18 with few C albican, BCx 12/17 neg, BAL 12/23 rare yeast; given WBC and some resp bacteria on gram stain, CCM starting 7 d abx (Rocephin) on 12/30 VTE: coumadin   Foley: removed, spont voids Dispo: ICU, possible CIR soon, stable for D/C to CIR from CCS standpoint, we can follow ostomy and nutrition while there.   Almarie Pringle, PA-C [facility] Please see Amion for pager number during day hours 7:00am-4:30pm       12/24/2024  *Care during the described time interval was provided by me. I have reviewed this patient's available data, including medical history, events of note, physical examination and test results as part of my evaluation.    "

## 2024-12-24 NOTE — Progress Notes (Signed)
 ANTICOAGULATION CONSULT NOTE  Pharmacy Consult for heparin  + warfarin Indication: bA/MVR  Allergies[1]  Patient Measurements: Height: 5' 10 (177.8 cm) Weight: 64.6 kg (142 lb 6.7 oz) IBW/kg (Calculated) : 73 Heparin  Dosing Weight: 70 kg   Vital Signs: Temp: 99.7 F (37.6 C) (01/01 0700) Temp Source: Oral (01/01 0700) BP: 109/73 (01/01 0800) Pulse Rate: 99 (01/01 0919)  Labs: Recent Labs    12/22/24 0406 12/22/24 0704 12/22/24 0929 12/22/24 1938 12/23/24 0213 12/23/24 0250 12/23/24 0812 12/23/24 1207 12/24/24 0143 12/24/24 0340  HGB 8.0*  --   --   --   --    < > 8.8*  --  8.7* 9.5*  HCT 24.0*  --   --   --   --    < > 26.7*  --  25.9* 28.0*  PLT 606*  --   --   --   --   --  618*  --  563*  --   LABPROT  --   --  14.9  --  14.9  --   --   --  15.6*  --   INR  --   --  1.1  --  1.1  --   --   --  1.2  --   HEPARINUNFRC  --   --  0.19*   < > 0.21*  --   --  0.30 0.37  --   CREATININE  --  0.52*  --   --  0.54*  --   --   --  0.53*  --    < > = values in this interval not displayed.    Estimated Creatinine Clearance: 106.5 mL/min (A) (by C-G formula based on SCr of 0.53 mg/dL (L)).  Assessment: 44 yo male presents s/p Impella-assisted MVR/AVR 12/8 with brief VT and ongoing ectopy on inotropes.  Postop course complicated by ischemic bowel s/p exlap 12/11 with partial small bowel resection, abdomen left open.  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 Impella 5.5 removed 12/24.  Per Dr. Lucas, plan for 3 months of OAC with dual valve replacement. Not on anticoagulation prior to admission.  Pharmacy consulted for heparin  and warfarin dosing.  INR 1.2 as expected after two doses, heparin  level therapeutic at 0.37, CBC stable.   Goal of Therapy:  INR 2-3 Heparin  level 0.3-0.5 units/ml Monitor platelets by anticoagulation protocol: Yes   Plan:  Heparin  2000 units/h Warfarin 5mg   tonight Daily INR, heparin  level, CBC  Ozell Jamaica, PharmD, BCPS, [facility] Clinical Pharmacist [phone] Please check AMION for all [facility] Pharmacy numbers 12/24/2024        [1] No Known Allergies

## 2024-12-24 NOTE — Progress Notes (Signed)
 "    Advanced Heart Failure Rounding Note  Cardiologist: None  Chief Complaint: Valvular Heart Disease & HFrEF Patient Profile   44 y.o. male w/ h/o heavy alcohol use (at least 5 drinks per evening) and h/o asthma admitted w/ acute systolic heart failure. Strong family history of cardiac disease: mother and maternal grandmother had heart failure and father with CAD. Admitted with acute HFrEF/cardiogenic shock. Echo w/ severe AR, MR and LV dysfunction.    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  12/9 VT/VF ? vagal episode. Extubated 12/11: S/p R thoracentesis with resultant hemothorax.  Chest tube placed. S/p R intercostal artery coil (IR). S/p exp lap for ischemic bowel resection, bowel left in discontinuity 12/15: Back to OR for ischemic R colon and mesenteric ischemia. S/p exp/lap, wound vac exchange, R colectomy without anastomosis.  12/16 Back to OR for small bowel resection and ileostomy  12/18 OR for VATS/hemothorax washout, pericardial window was not done.  12/19 extubated 12/21 Reintubated. Ileus 12/23 S/p Trach. Fever post procedure, Tmax 101. Abx switched to cefepime    12/24 Impella removed 12/25: DBA stopped. Diuretics held d/t low CVPs 12/26: Chest tube/foley out 12/29: Trach collar 12h and PMW. TPN stopped. Central line out.  Subjective:    No further fevers overnight. His WBC count is steadily improving.    Objective:    Weight Range: 64.6 kg Body mass index is 20.43 kg/m.   Vital Signs:   Temp:  [98.5 F (36.9 C)-99.7 F (37.6 C)] 99.7 F (37.6 C) (01/01 0700) Pulse Rate:  [91-103] 99 (01/01 0919) Resp:  [19-43] 28 (01/01 0723) BP: (86-111)/(57-83) 109/73 (01/01 0800) SpO2:  [91 %-100 %] 95 % (01/01 0800) FiO2 (%):  [28 %] 28 % (01/01 0800) Weight:  [64.6 kg] 64.6 kg (01/01 0500) Last BM Date : 12/23/24  Weight change: Filed Weights   12/22/24 0500 12/23/24 0500 12/24/24 0500  Weight: 68.2 kg 67.2 kg 64.6 kg    Intake/Output:  Intake/Output Summary (Last 24 hours) at 12/24/2024 1017 Last data filed at 12/24/2024 0900 Gross per 24 hour  Intake 2847.91 ml  Output 2475 ml  Net 372.91 ml    Physical Exam   GENERAL: Fair PULM:  Trach collar, normal WOB CARDIAC:  JVP: flat         Normal rate and rhythm, systolic murmur ABDOMEN: Soft, non-tender, non-distended. NEUROLOGIC: Patient is oriented x3 with no focal or lateralizing neurologic deficits.     Telemetry    SR 90s  Labs    CBC Recent Labs    12/23/24 0812 12/24/24 0143 12/24/24 0340  WBC 14.3* 11.4*  --   HGB 8.8* 8.7* 9.5*  HCT 26.7* 25.9* 28.0*  MCV 88.4 89.0  --   PLT 618* 563*  --    Basic Metabolic Panel Recent Labs    87/68/74 0213 12/23/24 0250 12/24/24 0143 12/24/24 0340  NA 132*   < > 131* 133*  K 4.4   < > 4.7 4.8  CL 98  --  99  --   CO2 26  --  24  --   GLUCOSE 107*  --  105*  --   BUN 17  --  15  --   CREATININE 0.54*  --  0.53*  --   CALCIUM  8.7*  --  8.7*  --   MG 1.4*  --  1.8  --    < > = values in this interval not displayed.  Liver Function Tests No results for input(s): AST, ALT, ALKPHOS, BILITOT, PROT, ALBUMIN  in the last 72 hours.  ProBNP (last 3 results) Recent Labs    11/22/24 1958  PROBNP 3,354.0*   Fasting Lipid Panel No results for input(s): CHOL, HDL, LDLCALC, TRIG, CHOLHDL, LDLDIRECT in the last 72 hours.  Medications:    Scheduled Medications:  amiodarone   200 mg Per Tube BID   arformoterol   15 mcg Nebulization BID   Chlorhexidine  Gluconate Cloth  6 each Topical Daily   colchicine   0.3 mg Oral Daily   [START ON 12/28/2024] cyanocobalamin  1,000 mcg Intramuscular Weekly   digoxin   0.125 mg Per Tube Daily   diphenoxylate -atropine   1 tablet Per Tube QID   famotidine   20 mg Per Tube BID   feeding supplement (PROSource TF20)  60 mL Per Tube BID   fiber supplement (BANATROL TF)  60 mL Per Tube QID   folic acid   1 mg Oral QHS   free water   100 mL  Per Tube Q4H   Gerhardt's butt cream   Topical TID   lidocaine   1 patch Transdermal Q24H   loperamide  HCl  4 mg Per Tube Q6H   losartan   25 mg Per Tube Daily   multivitamin with minerals  1 tablet Per Tube Daily   mouth rinse  15 mL Mouth Rinse Q2H   pantoprazole  (PROTONIX ) IV  40 mg Intravenous Daily   polycarbophil  625 mg Per Tube Daily   QUEtiapine   25 mg Per Tube QHS   Relizorb  1 Cartridge Does not apply Q0600   Relizorb  2 Cartridge Does not apply Q1400   revefenacin   175 mcg Nebulization Daily   scopolamine  1 patch Transdermal Q72H   sodium chloride  flush  3 mL Intravenous Q12H   spironolactone   25 mg Per Tube Daily   thiamine   100 mg Oral QHS   Warfarin - Pharmacist Dosing Inpatient   Does not apply q1600    Infusions:  sodium chloride  20 mL/hr at 12/17/24 1412   cefTRIAXone (ROCEPHIN)  IV 2 g (12/24/24 0929)   feeding supplement (VITAL 1.5 CAL) 45 mL/hr at 12/24/24 0800   feeding supplement (VITAL 1.5 CAL) Stopped (12/24/24 0537)   heparin  2,000 Units/hr (12/24/24 0800)   vancomycin  1,250 mg (12/23/24 2225)    PRN Medications: sodium chloride , acetaminophen  (TYLENOL ) oral liquid 160 mg/5 mL, albuterol , LORazepam , morphine  injection, ondansetron  (ZOFRAN ) IV, mouth rinse, oxyCODONE , polyethylene glycol, simethicone, sodium chloride , sodium chloride  flush  Assessment/Plan   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  Valvular Disease  - severe MR posteriorly directed, mod TR. Likely functional.  - severe AI>CMR - Severe AR/MR  - S/P bioprosthetic AVR/MVR 11/30/24 - On heparin  gtt. Started warfarin, INR 1.2, discussed with pharmacy  Acute hypoxic/hypercarbic respiratory failure - reintubated 12/21 due to hypercarbia  - suspect mostly respiratory muscle weakness at this point as the main limiting issue.  - s/p Trach 12/23 - weaning per CCM. On trach collar 24/7 at this point, awaiting secretions to improve prior to capping trial  Acute Systolic Heart Failure w/  Biventricular Dysfunction >> Cardiogenic Shock - HS trop not c/w ACS but EKG w/ anteroseptal Qs  - CMRI Severe AR/MR. LVEF 35%  - Cath- normal cors, RA5, PA 32/17 (24), PVR 2.57 Papi 3. CO 4.9 CI 3.8 - Post-op Echo 12/02/24: EF < 20% with severe LVH (new, ?inflammatory), moderately decreased RV function, stable bioprosthetic MV and AoV.  - Impella removed 12/24  - Ltd  echo 12/28: EF 40-45%, mildly reduced RV - Euvoelmic - continue spironolactone  25 mg daily - continue losartan  25 mg daily - continue digoxin  0.125 daily - No BP room to titrate further  Ischemic bowel - S/p exp/lap 12/11. Patchy ischemia of the distal ileum extending over about 75 cm without perforation. Ischemic segment resected.  Colon was distended with gas but viable. Small bowel left in discontinuity.  - Back to OR 12/15 for ischemic R colon and mesenteric ischemia. S/p exp/lap, wound vac exchange, R colectomy without anastomosis.  - Returned to OR 12/16 for ileostomy creation.  - GSU following. Ileostomy working well, continues w/ high output. On lomotil , banatrol and imodium  - TPN off. On TF. Now eating solid food. Hopefully can continue to wean  R hemothorax - Hemothorax s/p right thoracentesis with intercostal artery injury. Massive bleeding requiring multiple products and development of hemorrhagic shock, now improved. S/p IR embolization.  - S/p VATS 12/18 with right chest washout.  - H/H stable. CT out.  ETOH Abuse - heavy drinker, at least 5 drinks per evening - may be contributing to CM. Did not withdrawal - cessation needed at discharge   DMII  - Hgb A1C 6.1 - On SSI.  ID - BAL w/ enterobacter cloacae and candida albicans. Zosyn  > meropenem  (12/21 to 12/23) - Completed cefepime  (12/23 to 12/26) - Completed micafungin  (12/22 to 12/29) - Restarted CTX on 12/31 for low fevers, secretions. WBC count improving  AKI - Resolved.  VT - Quiescent - continue amio 200 mg bid  Pericardial effusion -  Echo 12/26 moderate effusion without tamponade - Limited echo 12/17 with moderate effusion.  - This was not drained at time of VATS/right hemothorax washout on 12/18. Appeared fibrinous. - Ltd echo 12/28 with increased, large pericardial effusion, no tamponade - continue colchicine  0.3 mg daily - Repeat limited echo tomorrow  12 Atrial flutter - Prev in/out of AFL with controlled rate.  - Remains in NSR. Continue amio 200 mg bid - On heparin . No bleeding. Warfarin started 12/30, INR 1.2  Length of Stay: 31    Morene JINNY Brownie, MD  12/24/2024, 10:17 AM  Advanced Heart Failure Team Pager [phone] (M-F; 7a - 5p)   Please visit [email or web address]: For overnight coverage please call cardiology fellow first. If fellow not available call Shock/ECMO MD on call.  For ECMO / Mechanical Support (Impella, IABP, LVAD) issues call Shock / ECMO MD on call.   "

## 2024-12-24 NOTE — Progress Notes (Signed)
" ° °   °  [address] [place] [employer] [address]             [phone]                8 Days Post-Op Procedures (LRB): REMOVAL, CARDIAC ASSIST DEVICE, IMPELLA (Right)   Events: No events Complains of bloating Not eating much _______________________________________________________________ Vitals: BP 110/75   Pulse (!) 102   Temp 99.7 F (37.6 C) (Oral)   Resp (!) 28   Ht 5' 10 (1.778 m)   Wt 64.6 kg   SpO2 97%   BMI 20.43 kg/m  Filed Weights   12/22/24 0500 12/23/24 0500 12/24/24 0500  Weight: 68.2 kg 67.2 kg 64.6 kg     - Neuro: alert NAD  - Cardiovascular: sinus  Drips: none.      - Pulm: EWOB FiO2 (%):  [28 %] 28 %  ABG    Component Value Date/Time   PHART 7.413 12/24/2024 0340   PCO2ART 34.1 12/24/2024 0340   PO2ART 59 (L) 12/24/2024 0340   HCO3 21.8 12/24/2024 0340   TCO2 23 12/24/2024 0340   ACIDBASEDEF 2.0 12/24/2024 0340   O2SAT 91 12/24/2024 0340    - Abd: ND, soft.  Stool in ostomy bag - Extremity: trace edema  .Intake/Output      12/31 0701 01/01 0700 01/01 0701 01/02 0700   I.V. (mL/kg) 409.7 (6.3)    NG/GT 2423    IV Piggyback 466.8    Total Intake(mL/kg) 3299.4 (51.1)    Urine (mL/kg/hr) 1525 (1)    Drains     Stool 1250    Total Output 2775    Net +524.4         Urine Occurrence 400 x    Stool Occurrence 1 x       _______________________________________________________________ Labs:    Latest Ref Rng & Units 12/24/2024    3:40 AM 12/24/2024    1:43 AM 12/23/2024    8:12 AM  CBC  WBC 4.0 - 10.5 K/uL  11.4  14.3   Hemoglobin 13.0 - 17.0 g/dL 9.5  8.7  8.8   Hematocrit 39.0 - 52.0 % 28.0  25.9  26.7   Platelets 150 - 400 K/uL  563  618       Latest Ref Rng & Units 12/24/2024    3:40 AM 12/24/2024    1:43 AM 12/23/2024    2:50 AM  CMP  Glucose 70 - 99 mg/dL  894    BUN 6 - 20 mg/dL  15    Creatinine 9.38 - 1.24 mg/dL  9.46    Sodium 864 - 854 mmol/L 133  131  134   Potassium 3.5 - 5.1 mmol/L 4.8  4.7   4.3   Chloride 98 - 111 mmol/L  99    CO2 22 - 32 mmol/L  24    Calcium  8.9 - 10.3 mg/dL  8.7      CXR: -  _______________________________________________________________  Assessment and Plan: AVR MVR impella 12/8 Xlap small bowel resection 12/12 End ileostomy 12/16 R VATS decort 12/18 Impella removal 12/24  Neuro: pain controlled CV: stable.  On amio, dig. Pulm: IS, ambulation.  Tolerating TC Renal: creat stable.  Net +524. GI: on tube feeds.  Adjusting rates to encourage more PO intake Heme: stable ID: afebrile.  On vanc and ceftriaxone Endo: SSI Dispo: continue ICU care   Alejandro Lopez 12/24/2024 7:51 AM   "

## 2024-12-24 NOTE — Progress Notes (Signed)
 "  NAME:  Alejandro Lopez, MRN:  [id number], DOB:  May 10, 1981, LOS: 31 ADMISSION DATE:  11/22/2024, CONSULTATION DATE:  12/8 REFERRING MD:  Lucas, CHIEF COMPLAINT:  post cardiac surgery critical care services    History of Present Illness:  44 year old male with history of hypertension, alcohol and tobacco abuse, presented to the emergency room on 11/30 with approximately 1 month history of progressive dyspnea accompanied by lower extremity swelling extending up to the level of his scrotum and abdomen. Diagnostic evaluation by echocardiogram showed left ventricular ejection fraction 35 to 40% with grade 3 diastolic dysfunction this was further complicated by severe mitral valve regurgitation and aortic insufficiency because of this he was transferred to [facility] for further evaluation. He underwent cardiac catheterization on 12/4: Right heart hemodynamic parameters showed baseline right atrial pressure 5 mmHg, PA pressure 32/17, pulmonary capillary wedge pressure at 14 estimated Fick 4.9 L/min with cardiac index Fick calculated at 2.59 His PAPi was 3 Left heart cath was negative for coronary artery disease Went to OR 12/8 for MVR and AVR w/ impella insertion. PCCM asked to assist w/ post op care   OR course EBL: 1735 Received  Products: cryo 92ml, FFP 401 Also received DDAVP , 2200 crystalloid, 250ml albumin   Cell saver 1125 Pump time 4hrs Clamp time 2hrs 50 min  Events: multiple defibrillations intra-op  Intra-op ECHO EF estimated 40%  Pertinent  Medical History  Tobacco abuse, alcohol abuse, hypertension  Significant Hospital Events: Including procedures, antibiotic start and stop dates in addition to other pertinent events   11/30 admitted/. ECHO 35 to 40% with grade 3 diastolic dysfunction this was further complicated by severe mitral valve regurgitation and aortic insufficiency 12/4 left and right heart cath 12/8 AVR and MVR w/ bioprosthetic valves. Arrived on icu w/  Impella 5.5 MCS at flow 4 lpm and P 7. Received 2 more PLTs, 2 FFP and 1 cryo in first 6 hrs post op for cont blood loss/oozing. Required NE, epi and milrinone . Good flow on IMPELLA but minimal pulsatility  at P7. Placed on Amio gtt for VT 12/9 received 1 unit PRBC over night for hgb down to 7.5, hgb 8 getting second unit of blood. Still on P7 3.5 lPM. Extubated. 12/11 1L thora, later hemorrhagic shock and hemothorax> IR embolization. Also found to have ischemic bowel and underwent ex-lap with partial resection of ileum. 12/15 plan for ostomy tomorrow, transition epi to levophed   12/16 plan back to OR for ostomy placement  12/17 hemodynamically stable off pressors on Milrinone  0.25 , unable to tolerate SBT, plan for CT chest and heparin  gtt, high fevers>micafungin  12/18 VATS for decortication R chest 12/19 extubated 12/20-12/21 abx to meropenem , aspiration/ agitation/ weakness leading to CO2 retention, RV dysfunction and reintubation, L CVC removed; R CVC placed 12/22: Failed SBT d/t wob 12/23: Bedside tracheostomy  12/24 Impella 5 5 was removed in OR, spiked fever with Tmax 101.1, Precedex  stopped, remained off pressors on dobutamine  at 2.5 12/25 tolerated 1 hr weaning on vent, off DBA 12/26 foley & chest tubes out  Interim History / Subjective:  Tmax 99. Still having cramping in abdomen. Simethicone helped some.   Objective    Blood pressure 106/74, pulse 99, temperature 99 F (37.2 C), temperature source Oral, resp. rate (!) 24, height 5' 10 (1.778 m), weight 67.2 kg, SpO2 98%.    FiO2 (%):  [28 %] 28 %   Intake/Output Summary (Last 24 hours) at 12/24/2024 0652 Last data filed at 12/24/2024 0536 Gross  per 24 hour  Intake 3378.9 ml  Output 2300 ml  Net 1078.9 ml   Filed Weights   12/21/24 0500 12/22/24 0500 12/23/24 0500  Weight: 75 kg 68.2 kg 67.2 kg   I/O +630cccc Net -15.7L for admission  Ostomy output 1250cc   Physical exam: General:  chronically ill appearing man lying  in bed in NAD HEENT: [place]/AT, eyes anicteirc Neck: trach in place, on TC Neuro: lethargic but arousable, able to answering questions, moving all extremities Chest: breathing comfortably on TC, less rhonchi Heart: S1S2, RRR Abdomen: ostomy pink with yellow opaque liquid output. Mildly distended, no guarding. Extremities: Minimal muscle mass; no edema.    BUN 15 Cr 0.53 WBC 11.4 H/H 8.7/25.9 Platelets 563 INR 1.2 Sputum: many PMNs, few GPC, GPR> normal flora    Patient Lines/Drains/Airways Status     Active Line/Drains/Airways     Name Placement date Placement time Site Days   Peripheral IV 12/22/24 22 G 1.75 Anterior;Left;Lateral Forearm 12/22/24  0800  Forearm  2   Negative Pressure Wound Therapy Abdomen Medial 12/10/24  0730  --  14   Ileostomy Standard (end) RLQ 12/08/24  1352  RLQ  16   Small Bore Feeding Tube 10 Fr. Left nare Marking at nare/corner of mouth 63 cm 12/18/24  1105  Left nare  6   Tracheostomy Shiley Flexible 6 mm Cuffed 12/15/24  1143  6 mm  9   Wound 11/30/24 1512 Surgical Closed Surgical Incision Chest Other (Comment) 11/30/24  1512  Chest  24   Wound 11/30/24 1512 Surgical Closed Surgical Incision Chest Right 11/30/24  1512  Chest  24   Wound 12/05/24 1000 Pressure Injury Buttocks Mid Deep Tissue Pressure Injury - Purple or maroon localized area of discolored intact skin or blood-filled blister due to damage of underlying soft tissue from pressure and/or shear. 12/05/24  1000  Buttocks  19   Wound 12/05/24 1033  Back Left;Upper 12/05/24  1033  Back  19   Wound 12/08/24 1408 Surgical Closed Surgical Incision Abdomen Other (Comment) 12/08/24  1408  Abdomen  16            Resolved problem list  AKI Lactic acidosis, resolved Constipation Hyponatremia Right-sided hemothorax status post IR embolization 12/11, status post VATS 12/18 Acute septic encephalopathy, resolved Postop ileus Assessment and Plan  Acute biventricular HFrEF with cardiogenic shock  status post Impella 5.5, removed on 12/24 Severe mitral regurgitation and aortic insufficiency status post bioprosthetic mitral valve and aortic valve replacement; valves functioning well Nonsustained VT, no more episodes Moderate posterior/ lateral pericardial effusion> not amenable to drainage -stable off inotropic and mechanical cardiac support -con't GDMT- spiro, dig, losartan  -with high output from ostomy, no need for diuretics -warfarin for 3 month course - amiodarone  200mg  BID - colchicine  -monitor on tele -monitor electrolytes and replete as needed; daily BMPs  Acute respiratory failure with hypoxia and hypercapnia status post tracheostomy - trach care per protocol -once trach secretions decrease will start capping trials -needs to have cuffed trach changed to cuffless before leaving ICU; may be decannulated before a change is needed -7 days of ceftriaxone for tracheitis vs possible mild pneumonia; abnormal CXR makes it hard to tell location exactly -Con't TC; weaning FiO2 -con't using PMV  Sepsis with septic shock due to recurrent aspiration pneumonia -Enterobacter Leukocyosis; slowly uptrending. UA reassurring, CXR unchanged. Pericardial fluid collection does not seem remarkably different on echo. -ceftriaxone to complete 7 day course; Wbc responding  Ischemic bowel s/p laparotomy  with partial ileum resection end-ileostomy  -con't TF -during the day decrease by 30% from goal and increase nightly TF by 30% -hoping to stimulate appetite; has been able to eat only a few bites at a time -con't fiber, banatrol, imodium , loperamide  -added relizorb to help with lipid absorption -decrease FWF -encourage PO intake -strict I/O -appreciate surgery's management -Anticipate he will require vitamin B12 injections.  Left closely monitor fat-soluble vitamin levels.  Shock liver, mild transaminase elevation from TPN --periodically monitor LFTs after TPN  Hypertriglyceridemia due to  propofol  infusion, now TPN -monitor  H/o alcohol abuse -Vitamins, recommend total cessation  Anemia of critical illness and previous ABLA from hemothorax, operative blood loss -transfuse for Hb <7 or hemodynamically significant bleeding  Agitation> situational due to prolonged illness> better Debility -CIR consulted -con't PT, OT, SLP -OOB mobility  Hyponatremia -encouraged gatorade intake rather than just water ; decrease FWF   DVT: heparin  gtt , warfarin GI: d/c pepcid ; needs back if he goes back on vent Lines:  PIVs   sodium chloride  20 mL/hr at 12/17/24 1412   cefTRIAXone (ROCEPHIN)  IV Stopped (12/23/24 0850)   feeding supplement (VITAL 1.5 CAL) 1,000 mL (12/24/24 0536)   feeding supplement (VITAL 1.5 CAL) Stopped (12/24/24 0537)   heparin  2,000 Units/hr (12/24/24 0344)   vancomycin  1,250 mg (12/23/24 2225)     Alejandro SHAUNNA Gaskins, DO 12/24/2024 8:17 PM [facility]  For contact information, see Amion. If no response to pager, please call PCCM consult pager. After hours, 7PM- 7AM, please call Elink.  "

## 2024-12-24 NOTE — Plan of Care (Signed)
" °  Problem: Education: Goal: Knowledge of General Education information will improve Description: Including pain rating scale, medication(s)/side effects and non-pharmacologic comfort measures Outcome: Progressing   Problem: Clinical Measurements: Goal: Ability to maintain clinical measurements within normal limits will improve Outcome: Progressing   Problem: Activity: Goal: Risk for activity intolerance will decrease Outcome: Not Progressing   Problem: Nutrition: Goal: Adequate nutrition will be maintained Outcome: Not Progressing   Problem: Education: Goal: Ability to demonstrate management of disease process will improve Outcome: Not Progressing   "

## 2024-12-25 ENCOUNTER — Inpatient Hospital Stay (HOSPITAL_COMMUNITY)

## 2024-12-25 DIAGNOSIS — I3139 Other pericardial effusion (noninflammatory): Secondary | ICD-10-CM

## 2024-12-25 LAB — BASIC METABOLIC PANEL WITH GFR
Anion gap: 8 (ref 5–15)
BUN: 17 mg/dL (ref 6–20)
CO2: 22 mmol/L (ref 22–32)
Calcium: 8.6 mg/dL — ABNORMAL LOW (ref 8.9–10.3)
Chloride: 100 mmol/L (ref 98–111)
Creatinine, Ser: 0.52 mg/dL — ABNORMAL LOW (ref 0.61–1.24)
GFR, Estimated: >60 mL/min
Glucose, Bld: 120 mg/dL — ABNORMAL HIGH (ref 70–99)
Potassium: 4.6 mmol/L (ref 3.5–5.1)
Sodium: 131 mmol/L — ABNORMAL LOW (ref 135–145)

## 2024-12-25 LAB — POCT I-STAT 7, (LYTES, BLD GAS, ICA,H+H)
Acid-base deficit: 3 mmol/L — ABNORMAL HIGH (ref 0.0–2.0)
Bicarbonate: 21.1 mmol/L (ref 20.0–28.0)
Calcium, Ion: 1.27 mmol/L (ref 1.15–1.40)
HCT: 25 % — ABNORMAL LOW (ref 39.0–52.0)
Hemoglobin: 8.5 g/dL — ABNORMAL LOW (ref 13.0–17.0)
O2 Saturation: 96 %
Patient temperature: 98.5
Potassium: 4.6 mmol/L (ref 3.5–5.1)
Sodium: 133 mmol/L — ABNORMAL LOW (ref 135–145)
TCO2: 22 mmol/L (ref 22–32)
pCO2 arterial: 33.7 mmHg (ref 32–48)
pH, Arterial: 7.404 (ref 7.35–7.45)
pO2, Arterial: 78 mmHg — ABNORMAL LOW (ref 83–108)

## 2024-12-25 LAB — PROTIME-INR
INR: 1.1 (ref 0.8–1.2)
Prothrombin Time: 15 s (ref 11.4–15.2)

## 2024-12-25 LAB — CBC
HCT: 25 % — ABNORMAL LOW (ref 39.0–52.0)
Hemoglobin: 8.2 g/dL — ABNORMAL LOW (ref 13.0–17.0)
MCH: 29.5 pg (ref 26.0–34.0)
MCHC: 32.8 g/dL (ref 30.0–36.0)
MCV: 89.9 fL (ref 80.0–100.0)
Platelets: 520 K/uL — ABNORMAL HIGH (ref 150–400)
RBC: 2.78 MIL/uL — ABNORMAL LOW (ref 4.22–5.81)
RDW: 16.9 % — ABNORMAL HIGH (ref 11.5–15.5)
WBC: 11.6 K/uL — ABNORMAL HIGH (ref 4.0–10.5)
nRBC: 0 % (ref 0.0–0.2)

## 2024-12-25 LAB — ECHOCARDIOGRAM LIMITED
AR max vel: 1.79 cm2
AV Area VTI: 1.7 cm2
AV Area mean vel: 1.78 cm2
AV Mean grad: 15 mmHg
AV Peak grad: 29.5 mmHg
Ao pk vel: 2.72 m/s
Area-P 1/2: 3.72 cm2
Height: 70 in
MV VTI: 1.46 cm2
S' Lateral: 4.3 cm
Single Plane A2C EF: 48.5 %
Weight: 2151.69 [oz_av]

## 2024-12-25 LAB — HEPARIN LEVEL (UNFRACTIONATED)
Heparin Unfractionated: 0.16 [IU]/mL — ABNORMAL LOW (ref 0.30–0.70)
Heparin Unfractionated: 0.36 [IU]/mL (ref 0.30–0.70)

## 2024-12-25 LAB — DIGOXIN LEVEL: Digoxin Level: 0.9 ng/mL (ref 0.8–2.0)

## 2024-12-25 LAB — MAGNESIUM: Magnesium: 1.4 mg/dL — ABNORMAL LOW (ref 1.7–2.4)

## 2024-12-25 MED ORDER — WARFARIN SODIUM 5 MG PO TABS: 5.0000 mg | ORAL_TABLET | Freq: Once | ORAL | Status: DC

## 2024-12-25 MED ORDER — WARFARIN SODIUM 5 MG PO TABS
7.5000 mg | ORAL_TABLET | Freq: Once | ORAL | Status: AC
  Administered 2024-12-25: 7.5 mg via ORAL
  Filled 2024-12-25: qty 1

## 2024-12-25 MED ORDER — MAGNESIUM SULFATE 4 GM/100ML IV SOLN
4.0000 g | Freq: Once | INTRAVENOUS | Status: AC
  Administered 2024-12-25: 4 g via INTRAVENOUS
  Filled 2024-12-25: qty 100

## 2024-12-25 MED ORDER — DIGOXIN 125 MCG PO TABS
0.0625 mg | ORAL_TABLET | Freq: Every day | ORAL | Status: DC
  Administered 2024-12-25 – 2024-12-28 (×4): 0.0625 mg
  Filled 2024-12-25 (×4): qty 1

## 2024-12-25 NOTE — Progress Notes (Signed)
 Physical Therapy Treatment Patient Details Name: Alejandro Lopez MRN: [id number] DOB: 1981/02/22 Today's Date: 12/25/2024   History of Present Illness The pt is a 44 yo male presenting 11/30 with swelling of LE, groin, and abdomen. Work up revealed acute HFrEF, severe MR, aortic insufficiency, and transaminates. 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 Extubated 12/9. S/p thoracentesis 12/11 with 1000cc's of blood drained from R pleural space, rapid recollection and pt then s/p R chest tube placed 12/11 S/p  coil embolization of right intercostal artery at level of right 10th rib, followed by ex lap with wound vac placement which showed patchy ischemia of ileum extending 75cm. Return to OR 12/14 with R colectomy and 12/16 for ostomy placement. To OR 12/18 for R VATS, washout, and pericardial window. Intubated 12/12-12/19, re-intubated 12/21, trach placed 12/23. PMH includes: HTN, alcohol use (5 drinks/day), tobacco use, but is limited by limited access to healthcare.    PT Comments  The pt is making great functional progress, now only needing minA for transitioning sidelying to sit and for some transfers. When cuing pt on ways to improve his transfer technique, he was able to progress to CGA for several reps. He was also able to ambulate an increased distance, but continues to ambulate slowly while relying on the [place] walker for support. He did not turn his head L<>R much when cued to challenge his dynamic gait balance. His slow gait speed and current reliance on an AD indicates he remains at risk for falls. Will continue to follow acutely. He is highly motivated to participate and improve to eventually return home and therefore could greatly benefit from intensive inpatient rehab, > 3 hours/day, to improve his balance, endurance, and teach him how to manage his ostomy and trach.    If plan is discharge home, recommend the following: Assistance with feeding;Assist for  transportation;Help with stairs or ramp for entrance;A little help with walking and/or transfers;A lot of help with bathing/dressing/bathroom   Can travel by private vehicle        Equipment Recommendations  Rollator (4 wheels);Other (comment) (tub bench/shower chair)    Recommendations for Other Services       Precautions / Restrictions Precautions Precautions: Fall;Sternal Precaution Booklet Issued: Yes (comment) Recall of Precautions/Restrictions: Intact Precaution/Restrictions Comments: Feeding tube, wound vac, ostomy, trach collar Restrictions Weight Bearing Restrictions Per Provider Order: Yes Other Position/Activity Restrictions: sternal precautions     Mobility  Bed Mobility Overal bed mobility: Needs Assistance Bed Mobility: Rolling, Sidelying to Sit Rolling: Contact guard assist Sidelying to sit: Min assist       General bed mobility comments: Cues to roll to side then sit up from sideling, minA at trunk, good initiation noted by pt    Transfers Overall transfer level: Needs assistance Equipment used:  (EVA walker) Transfers: Sit to/from Stand Sit to Stand: Min assist, Contact guard assist           General transfer comment: Cued pt to scoot to edge of chair/bed and rock to gain momentum, bringing nose over toes to facilitate adequate anterior weight shift to power up to stand. Pt displayed good compliance with his sternal precautions throughout. MinA to power up to stand x1 rep then progressed to CGA the other x5 reps    Ambulation/Gait Ambulation/Gait assistance: Supervision, Contact guard assist Gait Distance (Feet): 550 Feet Assistive device: Elyn Finder Gait Pattern/deviations: Step-through pattern, Decreased step length - right, Decreased step length - left, Decreased stride length, Trunk flexed  Gait velocity: reduced Gait velocity interpretation: <1.31 ft/sec, indicative of household ambulator   General Gait Details: Pt demonstrated good  carryover of cues to stand upright. He needs cues to pace himself and take standing rest breaks as needed. he continues to ambulate at a reduced speed without dynamic gait challenges. Cued pt to turn his head to challenge his vestibular system while ambulating, but only moved his head slightly, preferring to keep it facing anteriorly. CGA-supervision for safety   Stairs             Wheelchair Mobility     Tilt Bed    Modified Rankin (Stroke Patients Only)       Balance Overall balance assessment: Needs assistance Sitting-balance support: No upper extremity supported, Feet supported Sitting balance-Leahy Scale: Fair     Standing balance support: Bilateral upper extremity supported, During functional activity, Reliant on assistive device for balance, No upper extremity supported Standing balance-Leahy Scale: Fair Standing balance comment: Able to stand statically briefly without UE support, reliant on Eva walker to ambulate                            Communication Communication Communication: Impaired Factors Affecting Communication: Trach/intubated;[place]sey - Muir valve  Cognition Arousal: Alert Behavior During Therapy: WFL for tasks assessed/performed   PT - Cognitive impairments: No apparent impairments                       PT - Cognition Comments: Pt communicating and getting vocalization even without PMSV donned. Pt follows cues appropriately. Following commands: Intact      Cueing Cueing Techniques: Verbal cues, Tactile cues  Exercises Other Exercises Other Exercises: x5 serial sit <> stand from chair with CGA-minA    General Comments General comments (skin integrity, edema, etc.): VSS on trach collar 3L 28% when ambulating      Pertinent Vitals/Pain Pain Assessment Pain Assessment: Faces Faces Pain Scale: Hurts a little bit Pain Location: abdomen Pain Descriptors / Indicators: Discomfort, Sore Pain Intervention(s): Limited activity  within patient's tolerance, Monitored during session, Repositioned    Home Living                          Prior Function            PT Goals (current goals can now be found in the care plan section) Acute Rehab PT Goals Patient Stated Goal: to improve Progress towards PT goals: Progressing toward goals    Frequency    Min 2X/week      PT Plan      Co-evaluation              AM-PAC PT 6 Clicks Mobility   Outcome Measure  Help needed turning from your back to your side while in a flat bed without using bedrails?: A Little Help needed moving from lying on your back to sitting on the side of a flat bed without using bedrails?: A Little Help needed moving to and from a bed to a chair (including a wheelchair)?: A Little Help needed standing up from a chair using your arms (e.g., wheelchair or bedside chair)?: A Little Help needed to walk in hospital room?: A Little Help needed climbing 3-5 steps with a railing? : Total 6 Click Score: 16    End of Session Equipment Utilized During Treatment: Oxygen  Activity Tolerance: Patient tolerated treatment well Patient left: in  chair;with call bell/phone within reach Nurse Communication: Mobility status PT Visit Diagnosis: Unsteadiness on feet (R26.81);Muscle weakness (generalized) (M62.81);Other abnormalities of gait and mobility (R26.89);Difficulty in walking, not elsewhere classified (R26.2)     Time: 8869-8840 PT Time Calculation (min) (ACUTE ONLY): 29 min  Charges:    $Gait Training: 8-22 mins $Therapeutic Activity: 8-22 mins PT General Charges $$ ACUTE PT VISIT: 1 Visit                     Theo Ferretti, PT, DPT Acute Rehabilitation Services  Office: [phone]    Theo CHRISTELLA Ferretti 12/25/2024, 2:55 PM

## 2024-12-25 NOTE — Progress Notes (Signed)
 9 Days Post-Op Procedures (LRB): REMOVAL, CARDIAC ASSIST DEVICE, IMPELLA (Right) Subjective:  No specific complaints. He is waiting for his wife to bring him some food.  Ostomy output down to 1075 cc/24 hrs which is about goal.  Objective: Vital signs in last 24 hours: Temp:  [97.9 F (36.6 C)-101 F (38.3 C)] 98.6 F (37 C) (01/02 0807) Pulse Rate:  [92-119] 95 (01/02 0714) Cardiac Rhythm: Normal sinus rhythm;Sinus tachycardia (01/01 2000) Resp:  [18-42] 18 (01/02 0714) BP: (87-129)/(55-81) 108/77 (01/02 0714) SpO2:  [92 %-100 %] 97 % (01/02 0716) FiO2 (%):  [28 %] 28 % (01/02 0716) Weight:  [61 kg] 61 kg (01/02 0600)  Hemodynamic parameters for last 24 hours:    Intake/Output from previous day: 01/01 0701 - 01/02 0700 In: 3310.9 [I.V.:481.6; NG/GT:2229.3; IV Piggyback:600] Out: 2950 [Urine:1825; Drains:50; Stool:1075] Intake/Output this shift: No intake/output data recorded.  General appearance: alert and cooperative Neurologic: intact Heart: regular rate and rhythm Lungs: diminished breath sounds bibasilar Abdomen: soft, non-tender; bowel sounds normal Extremities: no edema Wound: chest incision and Impella incision healing well. Exit site closed over.  Lab Results: Recent Labs    12/24/24 0143 12/24/24 0340 12/25/24 0116 12/25/24 0555  WBC 11.4*  --  11.6*  --   HGB 8.7*   < > 8.2* 8.5*  HCT 25.9*   < > 25.0* 25.0*  PLT 563*  --  520*  --    < > = values in this interval not displayed.   BMET:  Recent Labs    12/24/24 0143 12/24/24 0340 12/25/24 0116 12/25/24 0555  NA 131*   < > 131* 133*  K 4.7   < > 4.6 4.6  CL 99  --  100  --   CO2 24  --  22  --   GLUCOSE 105*  --  120*  --   BUN 15  --  17  --   CREATININE 0.53*  --  0.52*  --   CALCIUM  8.7*  --  8.6*  --    < > = values in this interval not displayed.    PT/INR:  Recent Labs    12/25/24 0116  LABPROT 15.0  INR 1.1   ABG    Component Value Date/Time   PHART 7.404 12/25/2024 0555    HCO3 21.1 12/25/2024 0555   TCO2 22 12/25/2024 0555   ACIDBASEDEF 3.0 (H) 12/25/2024 0555   O2SAT 96 12/25/2024 0555   CBG (last 3)  No results for input(s): GLUCAP in the last 72 hours.  Assessment/Plan:  S/P AVR/MVR with pericardial valves and Insertion of Impella 5.5 for postop support 11/30/24. S/P right thoracentesis 12/03/24 complicated by massive right hemothorax due to intercostal artery laceration embolized by IR.  S/P Ex lap, 75 cm ileal resection and wound vac by GS for ischemic distal ileum 12/04/24. S/P re-exploration abdomen and right colectomy without anastomosis for ischemia 12/06/24. S/P re-exploration abdomen, limited distal small bowel resection and end ileostomy 12/08/24. S/P right VATS for decortication and drainage of retained hemothorax 12/10/24. S/P Percutaneous trach by CCM on 12/15/24 S/P removal of Impella 5.5 on 12/16/24.   Hemodynamics stable in NSR on amio per tube.  Heparin /Coumadin  per pharmacy.  Tube feeds with increased rate at night until po intake improves. Ostomy output at goal with bowel regimen.   Ceftriaxone for low grade fever and leukocytosis. Fever resolved and WBC down. Presumed pulmonary. Tracheal aspirate culture with normal flora.  Continue PT/OT  Hopefully CIR.  LOS: 32 days    Dorise MARLA Fellers 12/25/2024

## 2024-12-25 NOTE — Progress Notes (Signed)
 ANTICOAGULATION CONSULT NOTE  Pharmacy Consult for heparin  + warfarin Indication: bA/MVR  Allergies[1]  Patient Measurements: Height: 5' 10 (177.8 cm) Weight: 61 kg (134 lb 7.7 oz) IBW/kg (Calculated) : 73 Heparin  Dosing Weight: 70 kg   Vital Signs: Temp: 97.9 F (36.6 C) (01/02 0340) Temp Source: Oral (01/02 0340) BP: 108/77 (01/02 0714) Pulse Rate: 95 (01/02 0714)  Labs: Recent Labs    12/23/24 0213 12/23/24 0250 12/23/24 9187 12/23/24 1207 12/24/24 0143 12/24/24 0340 12/25/24 0116 12/25/24 0555  HGB  --    < > 8.8*  --  8.7* 9.5* 8.2* 8.5*  HCT  --    < > 26.7*  --  25.9* 28.0* 25.0* 25.0*  PLT  --   --  618*  --  563*  --  520*  --   LABPROT 14.9  --   --   --  15.6*  --  15.0  --   INR 1.1  --   --   --  1.2  --  1.1  --   HEPARINUNFRC 0.21*  --   --  0.30 0.37  --  0.16*  --   CREATININE 0.54*  --   --   --  0.53*  --  0.52*  --    < > = values in this interval not displayed.    Estimated Creatinine Clearance: 100.6 mL/min (A) (by C-G formula based on SCr of 0.52 mg/dL (L)).  Assessment: 44 yo male presents s/p Impella-assisted MVR/AVR 12/8 with brief VT and ongoing ectopy on inotropes.  Postop course complicated by ischemic bowel s/p exlap 12/11 with partial small bowel resection, abdomen left open.  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 Impella 5.5 removed 12/24.  Per Dr. Lucas, plan for 3 months of OAC with dual valve replacement. Not on anticoagulation prior to admission.  Pharmacy consulted for heparin  and warfarin dosing.  INR 1.1 as expected now after 3 doses. Heparin  level is therapeutic at 0.36, on heparin  infusion at 2100 units/hr. Hgb 8.5, plt 520. No s/sx of bleeding or infusion issues. Remains on tube feeds - with lower rate during the day and higher rate at night (which can impact warfarin bioavailability) and low oral intake for meals.   Goal of Therapy:   INR 2-3 Heparin  level 0.3-0.5 units/ml Monitor platelets by anticoagulation protocol: Yes   Plan:  Continue heparin  infusion at 2100 units/hr >> daily HL Warfarin 7.5 mg tonight Daily INR, heparin  level, CBC  Thank you for allowing pharmacy to participate in this patient's care,  Suzen Sour, PharmD, BCCCP Clinical Pharmacist  Phone: [phone] 12/25/2024 7:44 AM  Please check AMION for all [facility] Pharmacy phone numbers After 10:00 PM, call Main Pharmacy [phone]     [1] No Known Allergies

## 2024-12-25 NOTE — TOC Progression Note (Signed)
 Transition of Care [facility]) - Progression Note    Patient Details  Name: Alejandro Lopez MRN: [id number] Date of Birth: August 27, 1981  Transition of [facility]) CM/SW Contact  Arlana JINNY Nicholaus [place] Phone Number: [phone] 12/25/2024, 10:17 AM  Clinical Narrative:   [facility] CSW reviewed patients chart for discharge readiness, patient not medically stable for d/c. CIR still following, waiting for review. ICM will continue to follow.   [facility] CSW/CM will continue to follow and monitor for dc readiness.     Expected Discharge Plan: IP Rehab Facility Barriers to Discharge: Continued Medical Work up               Expected Discharge Plan and Services In-house Referral: Clinical Social Work Discharge Planning Services: CM Consult Post Acute Care Choice: IP Rehab Living arrangements for the past 2 months: Single Family Home                                       Social Drivers of Health (SDOH) Interventions SDOH Screenings   Food Insecurity: No Food Insecurity (11/22/2024)  Housing: Low Risk (11/22/2024)  Transportation Needs: No Transportation Needs (11/22/2024)  Utilities: Not At Risk (11/22/2024)  Social Connections: Unknown (05/06/2022)   Received from [facility]  Tobacco Use: High Risk (12/06/2024)    Readmission Risk Interventions     No data to display

## 2024-12-25 NOTE — Plan of Care (Signed)
" °  Problem: Education: Goal: Knowledge of General Education information will improve Description: Including pain rating scale, medication(s)/side effects and non-pharmacologic comfort measures Outcome: Progressing   Problem: Health Behavior/Discharge Planning: Goal: Ability to manage health-related needs will improve Outcome: Progressing   Problem: Clinical Measurements: Goal: Ability to maintain clinical measurements within normal limits will improve Outcome: Progressing Goal: Will remain free from infection Outcome: Progressing Goal: Diagnostic test results will improve Outcome: Progressing Goal: Respiratory complications will improve Outcome: Progressing Goal: Cardiovascular complication will be avoided Outcome: Progressing   Problem: Activity: Goal: Risk for activity intolerance will decrease Outcome: Progressing   Problem: Coping: Goal: Level of anxiety will decrease Outcome: Progressing   Problem: Elimination: Goal: Will not experience complications related to bowel motility Outcome: Progressing Goal: Will not experience complications related to urinary retention Outcome: Progressing   Problem: Pain Managment: Goal: General experience of comfort will improve and/or be controlled Outcome: Progressing   Problem: Safety: Goal: Ability to remain free from injury will improve Outcome: Progressing   Problem: Skin Integrity: Goal: Risk for impaired skin integrity will decrease Outcome: Progressing   Problem: Education: Goal: Ability to demonstrate management of disease process will improve Outcome: Progressing Goal: Ability to verbalize understanding of medication therapies will improve Outcome: Progressing   Problem: Activity: Goal: Capacity to carry out activities will improve Outcome: Progressing   "

## 2024-12-25 NOTE — PMR Pre-admission (Signed)
 PMR Admission Coordinator Pre-Admission Assessment  Patient: Alejandro Lopez is an 44 y.o., male MRN: [id number] DOB: 07-29-1981 Height: 5' 10 (177.8 cm) Weight: 65.1 kg  Insurance Information HMO:     PPO:      PCP:      IPA:      80/20:      OTHER:  PRIMARY: [place] Medicaid [employer] [facility]      Policy#: [id number]      Subscriber: pt CM Name: Beverley from [facility]   Phone#: [phone]     Fax#: [phone] Pre-Cert#: [id number]  auth for CIR from 12/28/24-01/04/25 for admit 12/28/24 with next review date 01/03/25.  Updates due to Alejandro Lopez at fax listed above        Benefits:  Phone #: [phone]     Eff. Date: 12/24/24 until 09/22/25     Deduct: none      Out of Pocket Max: none      Life Max: none CIR: 100% per medicaid      SNF: 100% per medicaid. First 90 days covered by [facility] then moved to [facility] Outpatient: 100% per medicaid     Co-Pay:  Home Health: 100% per medicaid      Co-Pay:  DME: 100% per medicaid     Co-Pay: limited by medical neccesity Providers: in network    The Data Collection Information Summary for patients in Inpatient Rehabilitation Facilities with attached Privacy Act Statement-Health Care Records was provided and verbally reviewed with: Patient  Emergency Contact Information Contact Information     Name Relation Home Work Mobile   Alejandro Spouse [phone]     Lopez,Alejandro Higinio [phone]  [phone]      Other Contacts   None on File     Current Medical History  Patient Admitting Diagnosis: Debility, Heart Failure with cardiogenic shock  History of Present Illness: 44 yo male presenting 11/30 to [facility] with swelling of LE, groin, and abdomen. Work up revealed acute HFrEF, severe MR, aortic insufficiency, and transaminates. Transferred to [facility] on 11/26/24. 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 Extubated 12/9. S/p thoracentesis 12/11 with 1000cc's of blood drained from R pleural space, rapid  recollection and pt then s/p R chest tube placed 12/11 S/p coil embolization of right intercostal artery at level of right 10th rib, followed by ex lap with wound vac placement which showed patchy ischemia of ileum extending 75cm. Return to OR 12/14 with R colectomy and 12/16 for ostomy placement. To OR 12/18 for R VATS, washout, and pericardial window. Intubated 12/12-12/19, re-intubated 12/21, trach placed 12/23. Impella removed 12/16/24. Patient de-cannulated 12/28/24. PMH includes: HTN, alcohol use (5 drinks/day), tobacco use, but is limited by limited access to healthcare.     [facility] has been reviewed by the rehabilitation admission coordinator and physician.  Past Medical History  Past Medical History:  Diagnosis Date   Asthma    Has the patient had major surgery during 100 days prior to admission? Yes  Family History   family history includes Diabetes in his mother; Hypertension in his mother; Stroke in his mother.  Current Medications Current Medications[1]  Patients Current Diet:  Diet Order             Diet regular Room service appropriate? Yes with Assist; Fluid consistency: Thin  Diet effective now                   Precautions /  Restrictions Precautions Precautions: Fall, Sternal Precaution Booklet Issued: Yes (comment) Precaution/Restrictions Comments: Feeding tube, wound vac, ostomy, trach collar Restrictions Weight Bearing Restrictions Per Provider Order: Yes (sternal precautions) RUE Weight Bearing Per Provider Order: Non weight bearing (sternal precautions) LUE Weight Bearing Per Provider Order: Non weight bearing (sternal precautions) Other Position/Activity Restrictions: sternal precautions   Has the patient had 2 or more falls or a fall with injury in the past year? No  Prior Activity Level Community (5-7x/wk): independent, working as lobbyist, driving  Prior Functional Level Self  Care: Did the patient need help bathing, dressing, using the toilet or eating? Independent  Indoor Mobility: Did the patient need assistance with walking from room to room (with or without device)? Independent  Stairs: Did the patient need assistance with internal or external stairs (with or without device)? Independent  Functional Cognition: Did the patient need help planning regular tasks such as shopping or remembering to take medications? Independent  Patient Information Are you of Hispanic, Latino/a,or Spanish origin?: A. No, not of Hispanic, Latino/a, or Spanish origin What is your race?: B. Black or African American Do you need or want an interpreter to communicate with a doctor or health care staff?: 0. No  Patient's Response To:  Health Literacy and Transportation Is the patient able to respond to health literacy and transportation needs?: Yes Health Literacy - How often do you need to have someone help you when you read instructions, pamphlets, or other written material from your doctor or pharmacy?: Never In the past 12 months, has lack of transportation kept you from medical appointments or from getting medications?: No In the past 12 months, has lack of transportation kept you from meetings, work, or from getting things needed for daily living?: No  Journalist, Newspaper / Equipment Home Assistive Devices/Equipment: None Home Equipment: None  Prior Device Use: Indicate devices/aids used by the patient prior to current illness, exacerbation or injury? None of the above  Current Functional Level Cognition  Orientation Level: Oriented X4    Extremity Assessment (includes Sensation/Coordination)  Upper Extremity Assessment: Generalized weakness (full BUE AROM) RUE Deficits / Details: generalized weakness, able to make gross grasp, thumb up adn demosntrate isolated finger movement. Grip @ 3/5; elbow/ flexion/ext 3/5; shoulder flx 2/5; extension 3/5. ROM/pulling limited with  R shoulder due to impella LUE Deficits / Details: similar to R  Lower Extremity Assessment: Defer to PT evaluation    ADLs  Overall ADL's : Needs assistance/impaired Eating/Feeding: Set up Grooming: Set up, Supervision/safety, Sitting Grooming Details (indicate cue type and reason): EOB Upper Body Bathing: Minimal assistance, Sitting Upper Body Bathing Details (indicate cue type and reason): for back Lower Body Bathing: Sit to/from stand, Moderate assistance Lower Body Bathing Details (indicate cue type and reason): knees down Upper Body Dressing : Sitting, Moderate assistance Lower Body Dressing: Minimal assistance, Sit to/from stand Lower Body Dressing Details (indicate cue type and reason): socks Toilet Transfer: Minimal assistance, Ambulation Toileting- Clothing Manipulation and Hygiene: Maximal assistance Toileting - Clothing Manipulation Details (indicate cue type and reason): ostomy bag Functional mobility during ADLs: Minimal assistance General ADL Comments: able to complete figure four positoinoing for LB ADL; asking questions regarding management of his ostomy    Mobility  Overal bed mobility: Needs Assistance Bed Mobility: Rolling, Sidelying to Sit Rolling: Contact guard assist Sidelying to sit: Min assist Supine to sit: Total assist Sit to sidelying: Mod assist General bed mobility comments: Cues to roll to side then sit up from  sideling, minA at trunk, good initiation noted by pt    Transfers  Overall transfer level: Needs assistance Equipment used:  (EVA walker) Transfers: Sit to/from Stand Sit to Stand: Min assist, Contact guard assist Bed to/from chair/wheelchair/BSC transfer type:: Step pivot Step pivot transfers: Min assist  Lateral/Scoot Transfers: Min assist, +2 physical assistance General transfer comment: Cued pt to scoot to edge of chair/bed and rock to gain momentum, bringing nose over toes to facilitate adequate anterior weight shift to power up to  stand. Pt displayed good compliance with his sternal precautions throughout. MinA to power up to stand x1 rep then progressed to CGA the other x5 reps    Ambulation / Gait / Stairs / Wheelchair Mobility  Ambulation/Gait Ambulation/Gait assistance: Supervision, Contact guard assist Gait Distance (Feet): 550 Feet Assistive device: Elyn Finder Gait Pattern/deviations: Step-through pattern, Decreased step length - right, Decreased step length - left, Decreased stride length, Trunk flexed General Gait Details: Pt demonstrated good carryover of cues to stand upright. He needs cues to pace himself and take standing rest breaks as needed. he continues to ambulate at a reduced speed without dynamic gait challenges. Cued pt to turn his head to challenge his vestibular system while ambulating, but only moved his head slightly, preferring to keep it facing anteriorly. CGA-supervision for safety Gait velocity: reduced Gait velocity interpretation: <1.31 ft/sec, indicative of household ambulator    Posture / Balance Dynamic Sitting Balance Sitting balance - Comments: pt requires some assist for achieving seated balance but then is able to maintain without outside assist. Balance Overall balance assessment: Needs assistance Sitting-balance support: No upper extremity supported, Feet supported Sitting balance-Leahy Scale: Fair Sitting balance - Comments: pt requires some assist for achieving seated balance but then is able to maintain without outside assist. Standing balance support: Bilateral upper extremity supported, During functional activity, Reliant on assistive device for balance, No upper extremity supported Standing balance-Leahy Scale: Fair Standing balance comment: Able to stand statically briefly without UE support, reliant on Eva walker to ambulate    Special considerations/life events  New ileostomy and trach this admission   Previous Home Environment Living Arrangements: Spouse/significant  other, Children  Lives With: Spouse (and 3 children) Available Help at Discharge: Family, Available 24 hours/day Type of Home: House Home Layout: One level Home Access: Stairs to enter Entrance Stairs-Rails: None Entrance Stairs-Number of Steps: 1 Bathroom Shower/Tub: Hydrographic surveyor, Buyer, Retail: Yes Home Care Services: No Additional Comments: reading glasses  Discharge Living Setting Plans for Discharge Living Setting: Patient's home, House, Lives with (comment) (spouse and 3 children) Type of Home at Discharge: House Discharge Home Layout: One level Discharge Home Access: Stairs to enter Entrance Stairs-Rails: None Entrance Stairs-Number of Steps: 1 Discharge Bathroom Shower/Tub: Tub/shower unit, Curtain Discharge Bathroom Toilet: Standard Discharge Bathroom Accessibility: Yes How Accessible: Accessible via walker Does the patient have any problems obtaining your medications?: No  Social/Family/Support Systems Patient Roles: Spouse, Parent Contact Information: spouse Anticipated Caregiver: wife Anticipated Caregiver's Contact Information: see contacts Ability/Limitations of Caregiver: wife is a Lexicographer Availability: 24/7 Discharge Plan Discussed with Primary Caregiver: Yes Is Caregiver In Agreement with Plan?: Yes Does Caregiver/Family have Issues with Lodging/Transportation while Pt is in Rehab?: No  Goals Patient/Family Goal for Rehab: Mod I to supervision with PT, OT and SLP Expected length of stay: ELOS 7 to 10 days Additional Information: New trach this admission Pt/Family Agrees to Admission and willing to participate: Yes Program Orientation Provided & Reviewed  with Pt/Caregiver Including Roles  & Responsibilities: Yes  Decrease burden of Care through IP rehab admission: n/a  Possible need for SNF placement upon discharge: not anticipated  Patient Condition: I have reviewed medical records from  [facility], spoken with  patient. I met with patient at the bedside for inpatient rehabilitation assessment.  Patient will benefit from ongoing PT, OT, and SLP, can actively participate in 3 hours of therapy a day 5 days of the week, and can make measurable gains during the admission.  Patient will also benefit from the coordinated team approach during an Inpatient Acute Rehabilitation admission.  The patient will receive intensive therapy as well as Rehabilitation physician, nursing, social worker, and care management interventions.  Due to bladder management, bowel management, safety, skin/wound care, disease management, medication administration, pain management, and patient education the patient requires 24 hour a day rehabilitation nursing.  The patient is currently min/mod A with mobility and basic ADLs.  Discharge setting and therapy post discharge at home with outpatient is anticipated.  Patient has agreed to participate in the Acute Inpatient Rehabilitation Program and will admit today.  Preadmission Screen Completed By:  Alison Heron Lot, RN MSN 12/27/2024 5:32 PM, updates provided by Kristyn Conetta, PT  ______________________________________________________________________   Discussed status with Dr. Babs on 12/28/24 at 14:00 and received approval for admission today.  Admission Coordinator:  Alison Heron Lot, RN MSN, updates provided by Cosmo Platter, PT  time 14:44/Date 12/28/24   Assessment/Plan: Diagnosis: debility Does the need for close, 24 hr/day Medical supervision in concert with the patient's rehab needs make it unreasonable for this patient to be served in a less intensive setting? Yes Co-Morbidities requiring supervision/potential complications: MR, AS, post-op, wound care, ostomy Due to bladder management, bowel management, safety, skin/wound care, disease management, medication administration, pain management, and patient education, does the patient  require 24 hr/day rehab nursing? Yes Does the patient require coordinated care of a physician, rehab nurse, PT, OT, and SLP to address physical and functional deficits in the context of the above medical diagnosis(es)? Yes Addressing deficits in the following areas: balance, endurance, locomotion, strength, transferring, bowel/bladder control, bathing, dressing, feeding, grooming, toileting, and psychosocial support, speech/trach Can the patient actively participate in an intensive therapy program of at least 3 hrs of therapy 5 days a week? Yes The potential for patient to make measurable gains while on inpatient rehab is excellent Anticipated functional outcomes upon discharge from inpatient rehab: modified independent and supervision PT, modified independent and supervision OT, modified independent SLP Estimated rehab length of stay to reach the above functional goals is: 7-10 days Anticipated discharge destination: Home 10. Overall Rehab/Functional Prognosis: excellent   MD Signature: Arthea IVAR Babs, MD, [facility] Physical Medicine & Rehabilitation Medical Director Rehabilitation Services 12/28/2024      [1]  Current Facility-Administered Medications:    0.9 %  sodium chloride  infusion (Manually program via Guardrails IV Fluids), , Intravenous, Continuous, Lucas Dorise POUR, MD, Last Rate: 20 mL/hr at 12/17/24 1412, New Bag at 12/17/24 1412   0.9 %  sodium chloride  infusion (Manually program via Guardrails IV Fluids), , Intravenous, PRN, Bartle, Bryan K, MD, Last Rate: 10 mL/hr at 12/07/24 0838, New Bag at 12/07/24 0838   acetaminophen  (TYLENOL ) 160 MG/5ML solution 650 mg, 650 mg, Per Tube, Q6H PRN, Bartle, Bryan K, MD, 650 mg at 12/17/24 0800   albuterol  (PROVENTIL ) (2.5 MG/3ML) 0.083% nebulizer solution 2.5 mg, 2.5 mg, Nebulization, Q4H PRN, Bartle, Bryan K, MD, 2.5 mg  at 12/20/24 1958   amiodarone  (PACERONE ) tablet 200 mg, 200 mg, Per Tube, BID, Salam, Savannah B, RPH, 200 mg  at 12/27/24 9086   arformoterol  (BROVANA ) nebulizer solution 15 mcg, 15 mcg, Nebulization, BID, Bartle, Bryan K, MD, 15 mcg at 12/27/24 0755   cefTRIAXone  (ROCEPHIN ) 2 g in sodium chloride  0.9 % 100 mL IVPB, 2 g, Intravenous, Q24H, Gretta Leita SQUIBB, DO, Stopped at 12/27/24 9048   Chlorhexidine  Gluconate Cloth 2 % PADS 6 each, 6 each, Topical, Daily, Bartle, Bryan K, MD, 6 each at 12/27/24 0915   colchicine  tablet 0.3 mg, 0.3 mg, Per Tube, Daily, Lucas Dorise POUR, MD, 0.3 mg at 12/27/24 1711   [START ON 12/28/2024] cyanocobalamin  (VITAMIN B12) injection 1,000 mcg, 1,000 mcg, Intramuscular, Weekly, Gretta Leita P, DO   digoxin  (LANOXIN ) tablet 0.0625 mg, 0.0625 mg, Per Tube, Daily, Colletta Manuelita Garre, PA-C, 0.0625 mg at 12/27/24 9086   diphenoxylate -atropine  (LOMOTIL ) 2.5-0.025 MG per tablet 1 tablet, 1 tablet, Per Tube, QID, Simaan, Elizabeth S, PA-C, 1 tablet at 12/27/24 1710   feeding supplement (PROSource TF20) liquid 60 mL, 60 mL, Per Tube, BID, Gretta Leita SQUIBB, DO, 60 mL at 12/27/24 0914   feeding supplement (VITAL 1.5 CAL) liquid 1,000 mL, 1,000 mL, Per Tube, Continuous, Cyndy Ozell DASEN, RPH, Last Rate: 45 mL/hr at 12/27/24 1700, Infusion Verify at 12/27/24 1700   feeding supplement (VITAL 1.5 CAL) liquid 1,000 mL, 1,000 mL, Per Tube, Continuous, Cyndy Ozell DASEN, RPH, Stopped at 12/27/24 0545   fiber supplement (BANATROL TF) liquid 60 mL, 60 mL, Per Tube, QID, Clark, Laura P, DO, 60 mL at 12/27/24 1707   folic acid  (FOLVITE ) tablet 1 mg, 1 mg, Oral, QHS, Bitonti, Michael T, RPH, 1 mg at 12/26/24 2157   free water  100 mL, 100 mL, Per Tube, Q4H, Gretta Leita P, DO, 100 mL at 12/27/24 1626   Gerhardt's butt cream, , Topical, TID, Bartle, Bryan K, MD, Given at 12/27/24 1626   lidocaine  (LIDODERM ) 5 % 1 patch, 1 patch, Transdermal, Q24H, Bartle, Bryan K, MD, 1 patch at 12/26/24 1800   loperamide  HCl (IMODIUM ) 1 MG/7.5ML suspension 4 mg, 4 mg, Per Tube, Q6H, Simaan, Elizabeth S, PA-C, 4 mg at  12/27/24 1439   LORazepam  (ATIVAN ) injection 1 mg, 1 mg, Intravenous, Q4H PRN, Bartle, Bryan K, MD, 1 mg at 12/20/24 2124   losartan  (COZAAR ) tablet 25 mg, 25 mg, Per Tube, Daily, Lee, Jordan, NP, 25 mg at 12/27/24 0913   morphine  (PF) 2 MG/ML injection 1 mg, 1 mg, Intravenous, Q3H PRN, Bartle, Bryan K, MD, 1 mg at 12/18/24 0242   multivitamin with minerals tablet 1 tablet, 1 tablet, Per Tube, Daily, Chen, Lydia D, RPH, 1 tablet at 12/27/24 9086   ondansetron  (ZOFRAN ) injection 4 mg, 4 mg, Intravenous, Q6H PRN, Bartle, Bryan K, MD, 4 mg at 12/23/24 1109   Oral care mouth rinse, 15 mL, Mouth Rinse, Q2H, Bartle, Dorise POUR, MD, 15 mL at 12/27/24 1711   Oral care mouth rinse, 15 mL, Mouth Rinse, PRN, Lucas Dorise POUR, MD   oxyCODONE  (Oxy IR/ROXICODONE ) immediate release tablet 5-10 mg, 5-10 mg, Per Tube, Q3H PRN, Bitonti, Michael T, RPH, 5 mg at 12/27/24 1710   pantoprazole  (PROTONIX ) injection 40 mg, 40 mg, Intravenous, Daily, Bartle, Bryan K, MD, 40 mg at 12/27/24 0911   polycarbophil (FIBERCON) tablet 625 mg, 625 mg, Per Tube, Daily, Signe Cree A, MD, 625 mg at 12/27/24 0914   polyethylene glycol (MIRALAX  / GLYCOLAX ) packet 17 g,  17 g, Per Tube, Daily PRN, Harold Scholz, MD   QUEtiapine  (SEROQUEL ) tablet 25 mg, 25 mg, Per Tube, QHS, Bartle, Bryan K, MD, 25 mg at 12/26/24 2156   Relizorb device 1 Cartridge, 1 Cartridge, Does not apply, Q0600, Gretta Leita SQUIBB, DO, 1 Cartridge at 12/27/24 0600   Relizorb device 2 Cartridge, 2 Cartridge, Does not apply, Q1400, Gretta Leita SQUIBB, DO, 2 Cartridge at 12/27/24 1437   revefenacin  (YUPELRI ) nebulizer solution 175 mcg, 175 mcg, Nebulization, Daily, Bartle, Bryan K, MD, 175 mcg at 12/27/24 0755   scopolamine  (TRANSDERM-SCOP) 1 MG/3DAYS 1 mg, 1 patch, Transdermal, Q72H, Gretta Leita SQUIBB, DO, 1 mg at 12/26/24 1146   simethicone  (MYLICON) 40 MG/0.6ML suspension 40 mg, 40 mg, Per Tube, QID PRN, Gretta Leita P, DO, 40 mg at 12/26/24 1342   sodium chloride  0.9 %  nebulizer solution 3 mL, 3 mL, Nebulization, Q8H PRN, Bartle, Bryan K, MD   sodium chloride  flush (NS) 0.9 % injection 3 mL, 3 mL, Intravenous, Q12H, Bartle, Bryan K, MD, 3 mL at 12/27/24 0915   sodium chloride  flush (NS) 0.9 % injection 3 mL, 3 mL, Intravenous, PRN, Bartle, Bryan K, MD   spironolactone  (ALDACTONE ) tablet 25 mg, 25 mg, Per Tube, Daily, Salam, Savannah B, RPH, 25 mg at 12/27/24 9086   thiamine  (VITAMIN B1) tablet 100 mg, 100 mg, Per Tube, QHS, Lucas Dorise POUR, MD   Warfarin - Pharmacist Dosing Inpatient, , Does not apply, q1600, Sherryll Suzen SQUIBB, RPH, Given at 12/27/24 1626

## 2024-12-25 NOTE — Progress Notes (Signed)
" ° °  Inpatient Rehabilitation Admissions Coordinator   I met with patient at bedside. I await updated PT notes today and then will begin Auth with [facility] Medicare [employer] today for possible CIR admit next week pending insurance approval. Patient in agreement.  Heron Leavell, RN, MSN Rehab Admissions Coordinator [phone] 12/25/2024 11:49 AM  "

## 2024-12-25 NOTE — Progress Notes (Signed)
" ° °  Inpatient Rehabilitation Admissions Coordinator   I will begin Auth for possible CIR admit next week if approved.  Heron Leavell, RN, MSN Rehab Admissions Coordinator [phone] 12/25/2024 3:01 PM  "

## 2024-12-25 NOTE — Progress Notes (Signed)
 Speech Language Pathology Treatment: Dysphagia  Patient Details Name: Alejandro Lopez MRN: [id number] DOB: 1981-04-06 Today's Date: 12/25/2024 Time: 8678-8670 SLP Time Calculation (min) (ACUTE ONLY): 8 min  Assessment / Plan / Recommendation Clinical Impression  Mr. Storie has made good progress. Jamal is capped; he was eating fries brought in by family with no s/s of difficulty.  Observed consuming regular solids, drinking thin liquids with no cough,strong phonation.  No cues needed. BS have been stable since he started an oral diet.    Recommend advancing to regular solid tray, thin liquids.  Pt pleased with progress.    HPI HPI: The pt is a 44 yo male presenting 11/30 with swelling of LE, groin, and abdomen. Work up revealed acute HFrEF, severe MR, aortic insufficiency, and transaminates. 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 Extubated 12/9. S/p thoracentesis 12/11 with 1000cc's of blood drained from R pleural space, rapid recollection and pt then s/p R chest tube placed 12/11 with 2.5L bloody output. S/p  coil embolization of right intercostal artery at level of right 10th rib, followed by ex lap with wound vac placement which showed patchy ischemia of ileum extending 75cm. Return to OR 12/14 with R colectomy and 12/16 for ostomy placement. To OR 12/18 for R VATS, washout, and pericardial window. Intubated 12/12-12/19, re-intubated 12/21. Trach 12/23. PMH includes: HTN, alcohol use (5 drinks/day), tobacco use, but is limited by limited access to healthcare.      SLP Plan  Continue with current plan of care        Swallow Evaluation Recommendations   Recommendations: PO diet PO Diet Recommendation: Regular;Thin liquids (Level 0) Liquid Administration via: Cup;Straw Medication Administration: Whole meds with puree Supervision: Patient able to self-feed Postural changes: Position pt fully upright for meals Oral care recommendations: Oral care QID (4x/day) Caregiver  Recommendations: Have oral suction available     Recommendations                         Frequent or constant Supervision/Assistance Dysphagia, pharyngeal phase (R13.13);Aphonia (R49.1)     Continue with current plan of care    Nida Manfredi L. Vona, MA CCC/SLP Clinical Specialist - Acute Care SLP Acute Rehabilitation Services Office number [phone]  Vona Palma Laurice  12/25/2024, 3:49 PM

## 2024-12-25 NOTE — Consult Note (Signed)
 WOC Nurse wound follow up Wound type: open surgical to mid-abd, improvement seen Measurement: 12 x 4 x 2.5 cm Wound bed: red, center of wound with valley Drainage serosanguinous drainage, no malodor Periwound: clean edges Dressing procedure/placement/frequency: VAC foam exchange Tues and Friday   Removed old NPWT of intact dressing and base of wound visualized, image taken Cleansed wound with normal saline x 2 Barrier strip placed adjacent to ostomy to create a barrier. Placed additional drape over sides of wound to prevent leakage Filled wound with  _0__ piece of Mepitel, ____0 piece of white foam, __01__ piece of black foam  Sealed NPWT dressing at Hg neg pressure Patient tolerated procedure well and no leak alarms noted, no VAC kits in room; coordinated this morning with primary nurse to have supplies ordered.    WOC nurse will continue to provide NPWT dressing changed due to the complexity of the dressing change.       WOC Nurse ostomy follow up Stoma type/location: Right lower abd, ileostomy  Appliance change was done after VAC dressing placed. Stomal assessment/size: 38 mm round, stoma is moist, red, viable, well-budded Peristomal assessment: intact plane, suturing intact Treatment options for stomal/peristomal skin: placed 1-piece convex, barrier ring Output  75 mls liquid brownish effluent Ostomy pouching: 1pc convex #[id number], barrier rings [phone] Education provided: reviewed frequency of change out, skin care needs, how to empty, patient will discharge to [facility], goal will be for him to change out appliance next VAC change. Patient shows interest in learning how to care for his ostomy. Enrolled patient in [place] Secure Start Discharge program: No, not ready for discharge  Please reconsult if wound worsens in condition and notify provider.   Sherrilyn Hals MSN RN CWOCN WOC [facility]  [phone] (Available from 7-3 pm Mon-Friday)

## 2024-12-25 NOTE — Progress Notes (Signed)
 "    Advanced Heart Failure Rounding Note  Cardiologist: None  Chief Complaint: Valvular Heart Disease & HFrEF Patient Profile   44 y.o. male w/ h/o heavy alcohol use (at least 5 drinks per evening) and h/o asthma admitted w/ acute systolic heart failure. Strong family history of cardiac disease: mother and maternal grandmother had heart failure and father with CAD. Admitted with acute HFrEF/cardiogenic shock. Echo w/ severe AR, MR and LV dysfunction.    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  12/9 VT/VF ? vagal episode. Extubated 12/11: S/p R thoracentesis with resultant hemothorax.  Chest tube placed. S/p R intercostal artery coil (IR). S/p exp lap for ischemic bowel resection, bowel left in discontinuity 12/15: Back to OR for ischemic R colon and mesenteric ischemia. S/p exp/lap, wound vac exchange, R colectomy without anastomosis.  12/16 Back to OR for small bowel resection and ileostomy  12/18 OR for VATS/hemothorax washout, pericardial window was not done.  12/19 extubated 12/21 Reintubated. Ileus 12/23 S/p Trach. Fever post procedure, Tmax 101. Abx switched to cefepime    12/24 Impella removed 12/25: DBA stopped. Diuretics held d/t low CVPs 12/26: Chest tube/foley out  Subjective:    Started 7 day course of ceftriaxone 12/31.  No fever overnight.  On trach collar.   Still having intermittent abdominal cramping. Trying to take in more po. Planning to ambulate soon.   Objective:    Weight Range: 61 kg Body mass index is 19.3 kg/m.   Vital Signs:   Temp:  [97.9 F (36.6 C)-101 F (38.3 C)] 97.9 F (36.6 C) (01/02 0340) Pulse Rate:  [92-119] 95 (01/02 0714) Resp:  [18-42] 18 (01/02 0714) BP: (87-129)/(55-81) 108/77 (01/02 0714) SpO2:  [92 %-100 %] 97 % (01/02 0716) FiO2 (%):  [28 %] 28 % (01/02 0716) Weight:  [61 kg] 61 kg (01/02 0600) Last BM Date : 12/24/24  Weight change: Filed Weights   12/23/24 0500 12/24/24 0500  12/25/24 0600  Weight: 67.2 kg 64.6 kg 61 kg   Intake/Output:  Intake/Output Summary (Last 24 hours) at 12/25/2024 0739 Last data filed at 12/25/2024 0700 Gross per 24 hour  Intake 3310.88 ml  Output 2075 ml  Net 1235.88 ml    Physical Exam   General:  Chronically ill appearing Cor: Regular rate & rhythm. No murmurs. Lungs: trach collar, breathing nonlabored Abdomen: soft, nondistended. + ostomy Extremities: no edema Neuro: alert & orientedx3. Affect pleasant     Telemetry    SR 90s  Labs    CBC Recent Labs    12/24/24 0143 12/24/24 0340 12/25/24 0116 12/25/24 0555  WBC 11.4*  --  11.6*  --   HGB 8.7*   < > 8.2* 8.5*  HCT 25.9*   < > 25.0* 25.0*  MCV 89.0  --  89.9  --   PLT 563*  --  520*  --    < > = values in this interval not displayed.   Basic Metabolic Panel Recent Labs    98/98/73 0143 12/24/24 0340 12/25/24 0116 12/25/24 0555  NA 131*   < > 131* 133*  K 4.7   < > 4.6 4.6  CL 99  --  100  --   CO2 24  --  22  --   GLUCOSE 105*  --  120*  --   BUN 15  --  17  --   CREATININE 0.53*  --  0.52*  --   CALCIUM  8.7*  --  8.6*  --   MG 1.8  --  1.4*  --    < > = values in this interval not displayed.   Liver Function Tests No results for input(s): AST, ALT, ALKPHOS, BILITOT, PROT, ALBUMIN  in the last 72 hours.  ProBNP (last 3 results) Recent Labs    11/22/24 1958  PROBNP 3,354.0*   Fasting Lipid Panel No results for input(s): CHOL, HDL, LDLCALC, TRIG, CHOLHDL, LDLDIRECT in the last 72 hours.  Medications:    Scheduled Medications:  amiodarone   200 mg Per Tube BID   arformoterol   15 mcg Nebulization BID   Chlorhexidine  Gluconate Cloth  6 each Topical Daily   colchicine   0.3 mg Oral Daily   [START ON 12/28/2024] cyanocobalamin  1,000 mcg Intramuscular Weekly   digoxin   0.125 mg Per Tube Daily   diphenoxylate -atropine   1 tablet Per Tube QID   feeding supplement (PROSource TF20)  60 mL Per Tube BID   fiber supplement  (BANATROL TF)  60 mL Per Tube QID   folic acid   1 mg Oral QHS   free water   100 mL Per Tube Q4H   Gerhardt's butt cream   Topical TID   lidocaine   1 patch Transdermal Q24H   loperamide  HCl  4 mg Per Tube Q6H   losartan   25 mg Per Tube Daily   multivitamin with minerals  1 tablet Per Tube Daily   mouth rinse  15 mL Mouth Rinse Q2H   pantoprazole  (PROTONIX ) IV  40 mg Intravenous Daily   polycarbophil  625 mg Per Tube Daily   QUEtiapine   25 mg Per Tube QHS   Relizorb  1 Cartridge Does not apply Q0600   Relizorb  2 Cartridge Does not apply Q1400   revefenacin   175 mcg Nebulization Daily   scopolamine  1 patch Transdermal Q72H   sodium chloride  flush  3 mL Intravenous Q12H   spironolactone   25 mg Per Tube Daily   thiamine   100 mg Oral QHS   Warfarin - Pharmacist Dosing Inpatient   Does not apply q1600    Infusions:  sodium chloride  20 mL/hr at 12/17/24 1412   cefTRIAXone (ROCEPHIN)  IV Stopped (12/24/24 0959)   feeding supplement (VITAL 1.5 CAL) Stopped (12/24/24 1759)   feeding supplement (VITAL 1.5 CAL) 75 mL/hr at 12/25/24 0700   heparin  2,100 Units/hr (12/25/24 0700)    PRN Medications: sodium chloride , acetaminophen  (TYLENOL ) oral liquid 160 mg/5 mL, albuterol , LORazepam , morphine  injection, ondansetron  (ZOFRAN ) IV, mouth rinse, oxyCODONE , polyethylene glycol, simethicone, sodium chloride , sodium chloride  flush  Assessment/Plan   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  Valvular Disease  - severe MR posteriorly directed, mod TR. Likely functional.  - severe AI>CMR - Severe AR/MR  - S/P bioprosthetic AVR/MVR 11/30/24 - On heparin  gtt. Started warfarin, INR 1.1, discussed with pharmacy  Acute hypoxic/hypercarbic respiratory failure - reintubated 12/21 due to hypercarbia  - suspect mostly respiratory muscle weakness at this point as the main limiting issue.  - s/p Trach 12/23 - weaning per CCM. On trach collar 24/7 at this point, awaiting secretions to improve prior to capping  trial  Acute Systolic Heart Failure w/ Biventricular Dysfunction >> Cardiogenic Shock - HS trop not c/w ACS but EKG w/ anteroseptal Qs  - CMRI Severe AR/MR. LVEF 35%  - Cath- normal cors, RA5, PA 32/17 (24), PVR 2.57 Papi 3. CO 4.9 CI 3.8 - Post-op Echo 12/02/24: EF < 20% with severe LVH (new, ?inflammatory), moderately decreased RV function, stable bioprosthetic MV  and AoV.  - Impella removed 12/24  - Ltd echo 12/28: EF 40-45%, mildly reduced RV - Euvolemic - continue spironolactone  25 mg daily - continue losartan  25 mg daily - decreasing digoxin  to 0.0625 mg daily, dig level 0.9 1/2 - No BP room to titrate further  Ischemic bowel - S/p exp/lap 12/11. Patchy ischemia of the distal ileum extending over about 75 cm without perforation. Ischemic segment resected.  Colon was distended with gas but viable. Small bowel left in discontinuity.  - Back to OR 12/15 for ischemic R colon and mesenteric ischemia. S/p exp/lap, wound vac exchange, R colectomy without anastomosis.  - Returned to OR 12/16 for ileostomy creation.  - GSU following. Ileostomy working well, continues w/ high output. On lomotil , banatrol and imodium  - TPN off. On TF. Now eating solid food. Hopefully can continue to wean  R hemothorax - Hemothorax s/p right thoracentesis with intercostal artery injury. Massive bleeding requiring multiple products and development of hemorrhagic shock, now improved. S/p IR embolization.  - S/p VATS 12/18 with right chest washout.  - H/H stable. CT out.  ETOH Abuse - heavy drinker, at least 5 drinks per evening - may be contributing to CM. Did not withdrawal - cessation needed at discharge   DMII  - Hgb A1C 6.1 - On SSI.  ID - BAL w/ enterobacter cloacae and candida albicans. Zosyn  > meropenem  (12/21 to 12/23) - Completed cefepime  (12/23 to 12/26) - Completed micafungin  (12/22 to 12/29) - Restarted CTX on 12/31 for low fevers, secretions. WBC count improving  AKI -  Resolved.  VT - Quiescent - continue amio 200 mg bid  Pericardial effusion - Echo 12/26 moderate effusion without tamponade - Limited echo 12/17 with moderate effusion.  - This was not drained at time of VATS/right hemothorax washout on 12/18. Appeared fibrinous. - Ltd echo 12/28 with increased, large pericardial effusion, no tamponade - continue colchicine  0.3 mg daily - Repeat limited echo today  12 Atrial flutter - Prev in/out of AFL with controlled rate.  - Remains in NSR. Continue amio 200 mg bid - On heparin . No bleeding. Warfarin started 12/30, INR 1.1   Continue aggressive PT/OT. Eventual CIR.   Length of Stay: 32   Demarious Kapur N, PA-C  12/25/2024, 7:39 AM  Advanced Heart Failure Team Pager [phone] (M-F; 7a - 5p)   Please visit [email or web address]: For overnight coverage please call cardiology fellow first. If fellow not available call Shock/ECMO MD on call.  For ECMO / Mechanical Support (Impella, IABP, LVAD) issues call Shock / ECMO MD on call.   "

## 2024-12-25 NOTE — Plan of Care (Signed)
 " Problem: Education: Goal: Knowledge of General Education information will improve Description: Including pain rating scale, medication(s)/side effects and non-pharmacologic comfort measures Outcome: Progressing   Problem: Health Behavior/Discharge Planning: Goal: Ability to manage health-related needs will improve Outcome: Progressing   Problem: Clinical Measurements: Goal: Ability to maintain clinical measurements within normal limits will improve Outcome: Progressing Goal: Will remain free from infection Outcome: Progressing Goal: Diagnostic test results will improve Outcome: Progressing Goal: Respiratory complications will improve Outcome: Progressing Goal: Cardiovascular complication will be avoided Outcome: Progressing   Problem: Activity: Goal: Risk for activity intolerance will decrease Outcome: Progressing   Problem: Nutrition: Goal: Adequate nutrition will be maintained Outcome: Progressing   Problem: Coping: Goal: Level of anxiety will decrease Outcome: Progressing   Problem: Elimination: Goal: Will not experience complications related to bowel motility Outcome: Progressing Goal: Will not experience complications related to urinary retention Outcome: Progressing   Problem: Pain Managment: Goal: General experience of comfort will improve and/or be controlled Outcome: Progressing   Problem: Safety: Goal: Ability to remain free from injury will improve Outcome: Progressing   Problem: Skin Integrity: Goal: Risk for impaired skin integrity will decrease Outcome: Progressing   Problem: Education: Goal: Ability to demonstrate management of disease process will improve Outcome: Progressing Goal: Ability to verbalize understanding of medication therapies will improve Outcome: Progressing   Problem: Activity: Goal: Capacity to carry out activities will improve Outcome: Progressing   Problem: Cardiac: Goal: Ability to achieve and maintain adequate  cardiopulmonary perfusion will improve Outcome: Progressing   Problem: Education: Goal: Understanding of CV disease, CV risk reduction, and recovery process will improve Outcome: Progressing   Problem: Activity: Goal: Ability to return to baseline activity level will improve Outcome: Progressing   Problem: Cardiovascular: Goal: Ability to achieve and maintain adequate cardiovascular perfusion will improve Outcome: Progressing Goal: Vascular access site(s) Level 0-1 will be maintained Outcome: Progressing   Problem: Health Behavior/Discharge Planning: Goal: Ability to safely manage health-related needs after discharge will improve Outcome: Progressing   Problem: Education: Goal: Understanding of CV disease, CV risk reduction, and recovery process will improve Outcome: Progressing   Problem: Activity: Goal: Ability to return to baseline activity level will improve Outcome: Progressing   Problem: Cardiovascular: Goal: Ability to achieve and maintain adequate cardiovascular perfusion will improve Outcome: Progressing Goal: Vascular access site(s) Level 0-1 will be maintained Outcome: Progressing   Problem: Health Behavior/Discharge Planning: Goal: Ability to safely manage health-related needs after discharge will improve Outcome: Progressing   Problem: Education: Goal: Will demonstrate proper wound care and an understanding of methods to prevent future damage Outcome: Progressing Goal: Knowledge of disease or condition will improve Outcome: Progressing Goal: Knowledge of the prescribed therapeutic regimen will improve Outcome: Progressing   Problem: Activity: Goal: Risk for activity intolerance will decrease Outcome: Progressing   Problem: Cardiac: Goal: Will achieve and/or maintain hemodynamic stability Outcome: Progressing   Problem: Clinical Measurements: Goal: Postoperative complications will be avoided or minimized Outcome: Progressing   Problem:  Respiratory: Goal: Respiratory status will improve Outcome: Progressing   Problem: Skin Integrity: Goal: Wound healing without signs and symptoms of infection Outcome: Progressing Goal: Risk for impaired skin integrity will decrease Outcome: Progressing   Problem: Urinary Elimination: Goal: Ability to achieve and maintain adequate renal perfusion and functioning will improve Outcome: Progressing   Problem: Cardiac: Goal: Ability to achieve and maintain adequate cardiopulmonary perfusion will improve Outcome: Progressing Goal: Vascular access site(s) Level 0-1 will be maintained Outcome: Progressing   Problem: Fluid Volume: Goal: Ability to  achieve a balanced intake and output will improve Outcome: Progressing   Problem: Physical Regulation: Goal: Complications related to the disease process, condition or treatment will be avoided or minimized Outcome: Progressing   Problem: Respiratory: Goal: Will regain and/or maintain adequate ventilation Outcome: Progressing   "

## 2024-12-25 NOTE — Progress Notes (Signed)
 9 Days Post-Op   Subjective/Chief Complaint: Doing fine today, no complaints, taking po   Objective: Vital signs in last 24 hours: Temp:  [97.9 F (36.6 C)-101 F (38.3 C)] 97.9 F (36.6 C) (01/02 0340) Pulse Rate:  [92-119] 95 (01/02 0714) Resp:  [18-42] 18 (01/02 0714) BP: (87-129)/(55-81) 108/77 (01/02 0714) SpO2:  [92 %-100 %] 97 % (01/02 0716) FiO2 (%):  [28 %] 28 % (01/02 0716) Weight:  [61 kg] 61 kg (01/02 0600) Last BM Date : 12/24/24  Intake/Output from previous day: 01/01 0701 - 01/02 0700 In: 3310.9 [I.V.:481.6; NG/GT:2229.3; IV Piggyback:600] Out: 2075 [Urine:1150; Drains:50; Stool:875] Intake/Output this shift: No intake/output data recorded.  Abd vac in place, soft, ostomy productive  Lab Results:  Recent Labs    12/24/24 0143 12/24/24 0340 12/25/24 0116 12/25/24 0555  WBC 11.4*  --  11.6*  --   HGB 8.7*   < > 8.2* 8.5*  HCT 25.9*   < > 25.0* 25.0*  PLT 563*  --  520*  --    < > = values in this interval not displayed.   BMET Recent Labs    12/24/24 0143 12/24/24 0340 12/25/24 0116 12/25/24 0555  NA 131*   < > 131* 133*  K 4.7   < > 4.6 4.6  CL 99  --  100  --   CO2 24  --  22  --   GLUCOSE 105*  --  120*  --   BUN 15  --  17  --   CREATININE 0.53*  --  0.52*  --   CALCIUM  8.7*  --  8.6*  --    < > = values in this interval not displayed.   PT/INR Recent Labs    12/24/24 0143 12/25/24 0116  LABPROT 15.6* 15.0  INR 1.2 1.1   ABG Recent Labs    12/24/24 0340 12/25/24 0555  PHART 7.413 7.404  HCO3 21.8 21.1    Studies/Results: No results found.  Anti-infectives: Anti-infectives (From admission, onward)    Start     Dose/Rate Route Frequency Ordered Stop   12/23/24 1100  vancomycin  (VANCOREADY) IVPB 1250 mg/250 mL  Status:  Discontinued        1,250 mg 166.7 mL/hr over 90 Minutes Intravenous Every 12 hours 12/23/24 0928 12/24/24 1257   12/23/24 0900  cefTRIAXone (ROCEPHIN) 2 g in sodium chloride  0.9 % 100 mL IVPB         2 g 200 mL/hr over 30 Minutes Intravenous Every 24 hours 12/23/24 0806 12/29/24 2359   12/23/24 0830  piperacillin -tazobactam (ZOSYN ) IVPB 3.375 g  Status:  Discontinued        3.375 g 12.5 mL/hr over 240 Minutes Intravenous Every 8 hours 12/23/24 0742 12/23/24 0806   12/15/24 1400  ceFEPIme  (MAXIPIME ) 2 g in sodium chloride  0.9 % 100 mL IVPB        2 g 200 mL/hr over 30 Minutes Intravenous Every 8 hours 12/15/24 1256 12/18/24 2258   12/14/24 1830  micafungin  (MYCAMINE ) 200 mg in sodium chloride  0.9 % 100 mL IVPB  Status:  Discontinued        200 mg 110 mL/hr over 1 Hours Intravenous  Once 12/14/24 1738 12/14/24 1743   12/14/24 1830  micafungin  (MYCAMINE ) 100 mg in sodium chloride  0.9 % 100 mL IVPB        100 mg 105 mL/hr over 1 Hours Intravenous Every 24 hours 12/14/24 1738 12/20/24 1922   12/13/24 2000  vancomycin  (VANCOREADY)  IVPB 1500 mg/300 mL  Status:  Discontinued        1,500 mg 150 mL/hr over 120 Minutes Intravenous Every 24 hours 12/13/24 1237 12/14/24 1418   12/13/24 0720  vancomycin  variable dose per unstable renal function (pharmacist dosing)  Status:  Discontinued         Does not apply See admin instructions 12/13/24 0720 12/13/24 1237   12/12/24 1630  meropenem  (MERREM ) 1 g in sodium chloride  0.9 % 100 mL IVPB  Status:  Discontinued        1 g 200 mL/hr over 30 Minutes Intravenous Every 8 hours 12/12/24 1534 12/15/24 1256   12/10/24 1000  micafungin  (MYCAMINE ) 100 mg in sodium chloride  0.9 % 100 mL IVPB  Status:  Discontinued       Placed in Followed by Linked Group   100 mg 105 mL/hr over 1 Hours Intravenous Every 24 hours 12/09/24 0704 12/11/24 0710   12/09/24 0830  micafungin  (MYCAMINE ) 200 mg in sodium chloride  0.9 % 100 mL IVPB       Placed in Followed by Linked Group   200 mg 110 mL/hr over 1 Hours Intravenous  Once 12/09/24 0704 12/09/24 1101   12/06/24 2200  vancomycin  (VANCOREADY) IVPB 1250 mg/250 mL  Status:  Discontinued        1,250 mg 166.7 mL/hr over  90 Minutes Intravenous Every 12 hours 12/06/24 0811 12/13/24 0709   12/06/24 0900  vancomycin  (VANCOREADY) IVPB 1750 mg/350 mL        1,750 mg 175 mL/hr over 120 Minutes Intravenous  Once 12/06/24 0802 12/06/24 1032   12/04/24 1100  vancomycin  (VANCOCIN ) IVPB 1000 mg/200 mL premix  Status:  Discontinued        1,000 mg 200 mL/hr over 60 Minutes Intravenous Every 12 hours 12/03/24 2238 12/03/24 2242   12/04/24 0000  vancomycin  (VANCOREADY) IVPB 1500 mg/300 mL  Status:  Discontinued        1,500 mg 150 mL/hr over 120 Minutes Intravenous  Once 12/03/24 2236 12/03/24 2242   12/03/24 2300  piperacillin -tazobactam (ZOSYN ) IVPB 3.375 g  Status:  Discontinued        3.375 g 12.5 mL/hr over 240 Minutes Intravenous Every 8 hours 12/03/24 2236 12/12/24 1534   11/30/24 2130  vancomycin  (VANCOCIN ) IVPB 1000 mg/200 mL premix        1,000 mg 200 mL/hr over 60 Minutes Intravenous  Once 11/30/24 1609 11/30/24 2351   11/30/24 1615  ceFAZolin  (ANCEF ) IVPB 2g/100 mL premix        2 g 200 mL/hr over 30 Minutes Intravenous Every 8 hours 11/30/24 1609 12/02/24 0546   11/30/24 0400  vancomycin  (VANCOREADY) IVPB 1250 mg/250 mL        1,250 mg 166.7 mL/hr over 90 Minutes Intravenous To Surgery 11/29/24 1040 11/30/24 0856   11/30/24 0400  ceFAZolin  (ANCEF ) IVPB 2g/100 mL premix        2 g 200 mL/hr over 30 Minutes Intravenous To Surgery 11/29/24 1040 11/30/24 1239   11/30/24 0400  ceFAZolin  (ANCEF ) IVPB 2g/100 mL premix  Status:  Discontinued        2 g 200 mL/hr over 30 Minutes Intravenous To Surgery 11/29/24 1036 11/30/24 1609       Assessment/Plan: 44 y/o M S/P AVR, MCR, Impella placement by Dr. Lucas 12/8  S/P right thoracentesis 12/11 with hemorrhage from intercostal -chest tube placed by TCTS.  S/p emergent IR embolizaton  R intercostal artery by Dr. Philip.    S/P VATS  12/18    Pneumatosis of the pelvic small bowel and R colon with portal venous gas on CT  POD 19/17/15 status post ex lap with SBR  & VAC placement, repeat laparotomy with patchy small bowel ischemia, end ileostomy/ab closure - WBC 11.4 - ostomy output 875 cc.  On banatrol TID, imodium  from 4 mg q 6h, lomotil  2 mg QID. CCM added relizorb yesterday. Continue current regimen.  - woc following - vac changes twice weekly - TF @ goal and now on DYS 3 diet; gradual transition to nocturnal TF per primary team, discussed with Dr. Gretta - Impella removal 12/24   FEN: DYS 3, IVF/free water  per primary,  TF as above ID: cefepime , micafungin  completed; resp cx 12/18 with few C albican, BCx 12/17 neg, BAL 12/23 rare yeast; given WBC and some resp bacteria on gram stain, CCM starting 7 d abx (Rocephin) on 12/30 VTE: coumadin   Foley: removed, spont voids Dispo: ICU, possible CIR soon, stable for D/C to CIR from [facility] standpoint, we can follow ostomy and nutrition while there.  Donnice Bury 12/25/2024

## 2024-12-25 NOTE — Progress Notes (Signed)
 Alejandro Lopez was capped per CCM order. Patient is currently on RA with SpO2 of 98%, no distress noted.

## 2024-12-25 NOTE — Progress Notes (Signed)
" °  Echocardiogram 2D Echocardiogram has been performed.  Alejandro Lopez, RDCS 12/25/2024, 9:14 AM "

## 2024-12-25 NOTE — Progress Notes (Signed)
 "  NAME:  Alejandro Lopez, MRN:  [id number], DOB:  1980-12-26, LOS: 32 ADMISSION DATE:  11/22/2024, CONSULTATION DATE:  12/8 REFERRING MD:  Lucas, CHIEF COMPLAINT:  post cardiac surgery critical care   History of Present Illness:  44 year old male with history of hypertension, alcohol and tobacco abuse, presented to the emergency room on 11/30 with approximately 1 month history of progressive dyspnea accompanied by lower extremity swelling extending up to the level of his scrotum and abdomen. Diagnostic evaluation by echocardiogram showed left ventricular ejection fraction 35 to 40% with grade 3 diastolic dysfunction this was further complicated by severe mitral valve regurgitation and aortic insufficiency because of this he was transferred to [facility] for further evaluation. He underwent cardiac catheterization on 12/4: Right heart hemodynamic parameters showed baseline right atrial pressure 5 mmHg, PA pressure 32/17, pulmonary capillary wedge pressure at 14 estimated Fick 4.9 L/min with cardiac index Fick calculated at 2.59 His PAPi was 3 Left heart cath was negative for coronary artery disease Went to OR 12/8 for MVR and AVR w/ impella insertion. PCCM asked to assist w/ post op care.   OR course EBL: 1735 Received  Products: cryo 92ml, FFP 401 Also received DDAVP , 2200 crystalloid, 250ml albumin   Cell saver 1125 Pump time 4hrs Clamp time 2hrs 50 min  Events: multiple defibrillations intra-op  Intra-op ECHO EF estimated 40%  Pertinent  Medical History  Tobacco abuse, alcohol abuse, hypertension  Significant Hospital Events: Including procedures, antibiotic start and stop dates in addition to other pertinent events   11/30 admitted/. ECHO 35 to 40% with grade 3 diastolic dysfunction this was further complicated by severe mitral valve regurgitation and aortic insufficiency 12/4 left and right heart cath 12/8 AVR and MVR w/ bioprosthetic valves. Arrived on icu w/ Impella 5.5 MCS  at flow 4 lpm and P 7. Received 2 more PLTs, 2 FFP and 1 cryo in first 6 hrs post op for cont blood loss/oozing. Required NE, epi and milrinone . Good flow on IMPELLA but minimal pulsatility  at P7. Placed on Amio gtt for VT 12/9 received 1 unit PRBC over night for hgb down to 7.5, hgb 8 getting second unit of blood. Still on P7 3.5 lPM. Extubated. 12/11 1L thora, later hemorrhagic shock and hemothorax> IR embolization. Also found to have ischemic bowel and underwent ex-lap with partial resection of ileum. 12/15 plan for ostomy tomorrow, transition epi to levophed   12/16 plan back to OR for ostomy placement  12/17 hemodynamically stable off pressors on Milrinone  0.25 , unable to tolerate SBT, plan for CT chest and heparin  gtt, high fevers>micafungin  12/18 VATS for decortication R chest 12/19 extubated 12/20-12/21 abx to meropenem , aspiration/ agitation/ weakness leading to CO2 retention, RV dysfunction and reintubation, L CVC removed; R CVC placed 12/22: Failed SBT d/t wob 12/23: Bedside tracheostomy  12/24 Impella 5 5 was removed in OR, spiked fever with Tmax 101.1, Precedex  stopped, remained off pressors on dobutamine  at 2.5 12/25 tolerated 1 hr weaning on vent, off DBA 12/26 foley & chest tubes out  Interim History / Subjective:  This morning still having some abdominal pain. Does not want to eat until his wife gets here. Has been sleeping with PMV on.   Objective    Blood pressure 102/68, pulse 97, temperature 97.9 F (36.6 C), temperature source Oral, resp. rate (!) 29, height 5' 10 (1.778 m), weight 64.6 kg, SpO2 99%.    FiO2 (%):  [28 %] 28 %   Intake/Output Summary (Last 24  hours) at 12/25/2024 0658 Last data filed at 12/25/2024 0500 Gross per 24 hour  Intake 2657.38 ml  Output 2075 ml  Net 582.38 ml   Filed Weights   12/22/24 0500 12/23/24 0500 12/24/24 0500  Weight: 68.2 kg 67.2 kg 64.6 kg   I/O +470cccc Net -15.6L for admission  Ostomy output 1075cc   Physical  exam: General:  middle aged man lying in bed in NAD HEENT: [place]/AT, eyes anicteric Neck: trach in place Neuro: awake, alert, moving all extremities Chest:  breathing comfortably on TC, CTAB Heart: S1S2, RRR Abdomen: ostomy pink, opaque yellow stool Extremities: minimal muscle mass, no edema  BUN 17 Cr 0.52 WBC 11.6 H/H 8.2/25 Platelets 520 INR 1.1 Sputum: many PMNs, few GPC, GPR> normal flora    Patient Lines/Drains/Airways Status     Active Line/Drains/Airways     Name Placement date Placement time Site Days   Peripheral IV 12/22/24 22 G 1.75 Anterior;Left;Lateral Forearm 12/22/24  0800  Forearm  3   Peripheral IV 12/24/24 22 G 1.75 Anterior;Right Forearm 12/24/24  1203  Forearm  1   Negative Pressure Wound Therapy Abdomen Medial 12/10/24  0730  --  15   Ileostomy Standard (end) RLQ 12/08/24  1352  RLQ  17   Small Bore Feeding Tube 10 Fr. Left nare Marking at nare/corner of mouth 63 cm 12/18/24  1105  Left nare  7   Tracheostomy Shiley Flexible 6 mm Cuffed 12/15/24  1143  6 mm  10   Wound 11/30/24 1512 Surgical Closed Surgical Incision Chest Other (Comment) 11/30/24  1512  Chest  25   Wound 11/30/24 1512 Surgical Closed Surgical Incision Chest Right 11/30/24  1512  Chest  25   Wound 12/05/24 1000 Pressure Injury Buttocks Mid Deep Tissue Pressure Injury - Purple or maroon localized area of discolored intact skin or blood-filled blister due to damage of underlying soft tissue from pressure and/or shear. 12/05/24  1000  Buttocks  20   Wound 12/05/24 1033  Back Left;Upper 12/05/24  1033  Back  20   Wound 12/08/24 1408 Surgical Closed Surgical Incision Abdomen Other (Comment) 12/08/24  1408  Abdomen  17            Resolved problem list  AKI Lactic acidosis, resolved Constipation Hyponatremia Right-sided hemothorax status post IR embolization 12/11, status post VATS 12/18 Acute septic encephalopathy, resolved Postop ileus Shock liver, mild transaminase elevation from  TPN Hypertriglyceridemia due to propofol  infusion, now TPN  Assessment and Plan  Acute biventricular HFrEF with cardiogenic shock status post Impella 5.5, removed on 12/24 Severe mitral regurgitation and aortic insufficiency status post bioprosthetic mitral valve and aortic valve replacement; valves functioning well Nonsustained VT, no more episodes Moderate posterior/ lateral pericardial effusion> not amenable to drainage -has been stable off inotropes and MCS -con't GDMT - dig, spiro, losartan  -No need for diuretics while ostomy output remains high; longer term risk for him is dehydration -warfarin for 3 months, heparin  bridge. Pharmacy to dose both. -con't amiodarone  200mg  BID -colchicine  -tele monitoring  Acute respiratory failure with hypoxia and hypercapnia status post tracheostomy -trach care per protocol -Start capping trials today; if he does well for 48h will decannulate on 1/4. If uncapped for any reason, a note should be written in the chart to restart capping trials.  -complete 7 days of ceftriaxone; last day 12/29/24  Sepsis with septic shock due to recurrent aspiration pneumonia -Enterobacter Leukocyosis; slowly uptrending. UA reassurring, CXR unchanged. Pericardial fluid collection does not seem remarkably  different on echo. 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 During the day decrease by 30% from goal and increase nightly TF by 30%.  -Encouraging him to eat and drink -con't fiber, banatrol, imodium , loperamide ; meeting goal for ostomy output ~1L -con't relizorb to help with lipid absorption- 1 cartridge during the day, 2 at night for higher TF flow rates. Anticipate he would benefit from Creon for short gut once off TF -FWF; need to titrate based on I/O -appreciate surgery's management -Anticipate he will require vitamin B12 injections.   Left closely monitor fat-soluble vitamin levels.  Hypertriglyceridemia due to propofol  infusion, now TPN  H/o alcohol abuse -vitamins, recommend total ETOH avoidance  Anemia of critical illness and previous ABLA from hemothorax, operative blood loss -transfuse for Hb <7 or hemodynamically significant bleeding  Agitation> situational due to prolonged illness> better Debility -CIR consulted -PT, OT, SLP -OOB mobility; has had trouble with cramping  Hyponatremia -need to ensure drinking fluids with electrolytes due to high fluid needs    DVT: heparin  gtt , warfarin GI: stopped-- would need to be restarted if he goes back on vent Lines:  PIVs   sodium chloride  20 mL/hr at 12/17/24 1412   cefTRIAXone (ROCEPHIN)  IV Stopped (12/24/24 0959)   feeding supplement (VITAL 1.5 CAL) Stopped (12/24/24 1759)   feeding supplement (VITAL 1.5 CAL) 1,000 mL (12/25/24 0336)   heparin  2,100 Units/hr (12/25/24 0328)     Leita SHAUNNA Gaskins, DO 12/25/2024 1:09 PM [facility]  For contact information, see Amion. If no response to pager, please call PCCM consult pager. After hours, 7PM- 7AM, please call Elink.  "

## 2024-12-26 LAB — BASIC METABOLIC PANEL WITH GFR
Anion gap: 8 (ref 5–15)
BUN: 15 mg/dL (ref 6–20)
CO2: 22 mmol/L (ref 22–32)
Calcium: 8.6 mg/dL — ABNORMAL LOW (ref 8.9–10.3)
Chloride: 99 mmol/L (ref 98–111)
Creatinine, Ser: 0.5 mg/dL — ABNORMAL LOW (ref 0.61–1.24)
GFR, Estimated: >60 mL/min
Glucose, Bld: 103 mg/dL — ABNORMAL HIGH (ref 70–99)
Potassium: 5 mmol/L (ref 3.5–5.1)
Sodium: 130 mmol/L — ABNORMAL LOW (ref 135–145)

## 2024-12-26 LAB — PROTIME-INR
INR: 1.1 (ref 0.8–1.2)
Prothrombin Time: 14.8 s (ref 11.4–15.2)

## 2024-12-26 LAB — CBC
HCT: 25.4 % — ABNORMAL LOW (ref 39.0–52.0)
Hemoglobin: 8.3 g/dL — ABNORMAL LOW (ref 13.0–17.0)
MCH: 29.3 pg (ref 26.0–34.0)
MCHC: 32.7 g/dL (ref 30.0–36.0)
MCV: 89.8 fL (ref 80.0–100.0)
Platelets: 487 K/uL — ABNORMAL HIGH (ref 150–400)
RBC: 2.83 MIL/uL — ABNORMAL LOW (ref 4.22–5.81)
RDW: 16.8 % — ABNORMAL HIGH (ref 11.5–15.5)
WBC: 10.8 K/uL — ABNORMAL HIGH (ref 4.0–10.5)
nRBC: 0 % (ref 0.0–0.2)

## 2024-12-26 LAB — HEMOGLOBIN AND HEMATOCRIT, BLOOD
HCT: 24.8 % — ABNORMAL LOW (ref 39.0–52.0)
Hemoglobin: 8.2 g/dL — ABNORMAL LOW (ref 13.0–17.0)

## 2024-12-26 LAB — HEPARIN LEVEL (UNFRACTIONATED): Heparin Unfractionated: 0.42 [IU]/mL (ref 0.30–0.70)

## 2024-12-26 LAB — MAGNESIUM: Magnesium: 1.7 mg/dL (ref 1.7–2.4)

## 2024-12-26 MED ORDER — MAGNESIUM SULFATE 4 GM/100ML IV SOLN
4.0000 g | Freq: Once | INTRAVENOUS | Status: AC
  Administered 2024-12-26: 4 g via INTRAVENOUS
  Filled 2024-12-26: qty 100

## 2024-12-26 MED ORDER — WARFARIN SODIUM 5 MG PO TABS
5.0000 mg | ORAL_TABLET | Freq: Once | ORAL | Status: AC
  Administered 2024-12-26: 5 mg via ORAL
  Filled 2024-12-26: qty 1

## 2024-12-26 NOTE — Progress Notes (Signed)
 "  NAME:  Alejandro Lopez, MRN:  [id number], DOB:  Feb 17, 1981, LOS: 33 ADMISSION DATE:  11/22/2024, CONSULTATION DATE:  12/8 REFERRING MD:  Lucas, CHIEF COMPLAINT:  post cardiac surgery critical care   History of Present Illness:  44 year old male with history of hypertension, alcohol and tobacco abuse, presented to the emergency room on 11/30 with approximately 1 month history of progressive dyspnea accompanied by lower extremity swelling extending up to the level of his scrotum and abdomen. Diagnostic evaluation by echocardiogram showed left ventricular ejection fraction 35 to 40% with grade 3 diastolic dysfunction this was further complicated by severe mitral valve regurgitation and aortic insufficiency because of this he was transferred to [facility] for further evaluation. He underwent cardiac catheterization on 12/4: Right heart hemodynamic parameters showed baseline right atrial pressure 5 mmHg, PA pressure 32/17, pulmonary capillary wedge pressure at 14 estimated Fick 4.9 L/min with cardiac index Fick calculated at 2.59 His PAPi was 3 Left heart cath was negative for coronary artery disease Went to OR 12/8 for MVR and AVR w/ impella insertion. PCCM asked to assist w/ post op care.   OR course EBL: 1735 Received  Products: cryo 92ml, FFP 401 Also received DDAVP , 2200 crystalloid, albumin   Cell saver 1125 Pump time 4hrs Clamp time 2hrs 50 min  Events: multiple defibrillations intra-op  Intra-op ECHO EF estimated 40%  Pertinent  Medical History  Tobacco abuse, alcohol abuse, hypertension  Significant Hospital Events: Including procedures, antibiotic start and stop dates in addition to other pertinent events   11/30 admitted/. ECHO 35 to 40% with grade 3 diastolic dysfunction this was further complicated by severe mitral valve regurgitation and aortic insufficiency 12/4 left and right heart cath 12/8 AVR and MVR w/ bioprosthetic valves. Arrived on icu w/ Impella 5.5 MCS  at flow 4 lpm and P 7. Received 2 more PLTs, 2 FFP and 1 cryo in first 6 hrs post op for cont blood loss/oozing. Required NE, epi and milrinone . Good flow on IMPELLA but minimal pulsatility  at P7. Placed on Amio gtt for VT 12/9 received 1 unit PRBC over night for hgb down to 7.5, hgb 8 getting second unit of blood. Still on P7 3.5 lPM. Extubated. 12/11 1L thora, later hemorrhagic shock and hemothorax> IR embolization. Also found to have ischemic bowel and underwent ex-lap with partial resection of ileum. 12/15 plan for ostomy tomorrow, transition epi to levophed   12/16 plan back to OR for ostomy placement  12/17 hemodynamically stable off pressors on Milrinone  0.25 , unable to tolerate SBT, plan for CT chest and heparin  gtt, high fevers>micafungin  12/18 VATS for decortication R chest 12/19 extubated 12/20-12/21 abx to meropenem , aspiration/ agitation/ weakness leading to CO2 retention, RV dysfunction and reintubation, L CVC removed; R CVC placed 12/22: Failed SBT d/t wob 12/23: Bedside tracheostomy  12/24 Impella 5 5 was removed in OR, spiked fever with Tmax 101.1, Precedex  stopped, remained off pressors on dobutamine  at 2.5 12/25 tolerated 1 hr weaning on vent, off DBA 12/26 foley & chest tubes out  Interim History / Subjective:  Patient had some blood in the bowel movements.  Hemoglobin is stable hemodynamically stable.  Heparin  GTT is held.  General surgery and the CTS are okay with this.  Patient is right capped for tracheostomy and tolerating it well.  Objective    Blood pressure 99/68, pulse (!) 107, temperature 99.4 F (37.4 C), temperature source Oral, resp. rate (!) 22, height 5' 10 (1.778 m), weight 65.5 kg, SpO2  95%.    FiO2 (%):  [21 %] 21 %   Intake/Output Summary (Last 24 hours) at 12/26/2024 1742 Last data filed at 12/26/2024 1733 Gross per 24 hour  Intake 3630.22 ml  Output 2630 ml  Net 1000.22 ml   Filed Weights   12/24/24 0500 12/25/24 0600 12/26/24 0619  Weight:  64.6 kg 61 kg 65.5 kg     Ostomy output 1200 cc.   Physical exam: General: Chronically ill-appearing male, lying on the bed HEENT: [place]/AT, eyes anicteric.  moist mucus membranes Neuro: Alert, awake following commands Chest: Coarse breath sounds, no wheezes or rhonchi Heart: Regular rate and rhythm, no murmurs or gallops Abdomen: Soft, nontender, nondistended, bowel sounds present ostomy is noted normal.   Sputum: many PMNs, few GPC, GPR> normal flora    Patient Lines/Drains/Airways Status     Active Line/Drains/Airways     Name Placement date Placement time Site Days   Peripheral IV 12/22/24 22 G 1.75 Anterior;Left;Lateral Forearm 12/22/24  0800  Forearm  4   Peripheral IV 12/24/24 22 G 1.75 Anterior;Right Forearm 12/24/24  1203  Forearm  2   Negative Pressure Wound Therapy Abdomen Medial 12/10/24  0730  --  16   Ileostomy Standard (end) RLQ 12/08/24  1352  RLQ  18   Small Bore Feeding Tube 10 Fr. Left nare Marking at nare/corner of mouth 63 cm 12/18/24  1105  Left nare  8   Tracheostomy Shiley Flexible 6 mm Cuffed 12/15/24  1143  6 mm  11   Wound 11/30/24 1512 Surgical Closed Surgical Incision Chest Other (Comment) 11/30/24  1512  Chest  26   Wound 11/30/24 1512 Surgical Closed Surgical Incision Chest Right 11/30/24  1512  Chest  26   Wound 12/05/24 1000 Pressure Injury Buttocks Mid Deep Tissue Pressure Injury - Purple or maroon localized area of discolored intact skin or blood-filled blister due to damage of underlying soft tissue from pressure and/or shear. 12/05/24  1000  Buttocks  21   Wound 12/05/24 1033  Back Left;Upper 12/05/24  1033  Back  21   Wound 12/08/24 1408 Surgical Closed Surgical Incision Abdomen Other (Comment) 12/08/24  1408  Abdomen  18            Resolved problem list  AKI Lactic acidosis, resolved Constipation Hyponatremia Right-sided hemothorax status post IR embolization 12/11, status post VATS 12/18 Acute septic encephalopathy,  resolved Postop ileus Shock liver, mild transaminase elevation from TPN Hypertriglyceridemia due to propofol  infusion, now TPN  Assessment and Plan  Acute biventricular HFrEF with cardiogenic shock status post Impella 5.5, removed on 12/24 Severe mitral regurgitation and aortic insufficiency status post bioprosthetic mitral valve and aortic valve replacement; valves functioning well Nonsustained VT, no more episodes Moderate posterior/ lateral pericardial effusion> not amenable to drainage -has been stable off inotropes and MCS -con't GDMT - dig, spiro, losartan  -No need for diuretics while ostomy output remains high; longer term risk for him is dehydration -warfarin for 3 months, heparin  bridge. Pharmacy to dose both. -con't amiodarone  200mg  BID -colchicine  -tele monitoring  Acute respiratory failure with hypoxia and hypercapnia status post tracheostomy -trach care per protocol Tolerating capping.  If he does well for 48h will decannulate on 1/4. If uncapped for any reason, a note should be written in the chart to restart capping trials.  -complete 7 days of ceftriaxone ; last day 12/29/24  Sepsis with septic shock due to recurrent aspiration pneumonia -Enterobacter Leukocyosis; slowly uptrending. UA reassurring, CXR unchanged. Pericardial fluid collection  does not seem remarkably different on echo. 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 During the day decrease by 30% from goal and increase nightly TF by 30%.  -Encouraging him to eat and drink -con't fiber, banatrol, imodium , loperamide ; meeting goal for ostomy output ~1L -con't relizorb to help with lipid absorption- 1 cartridge during the day, 2 at night for higher TF flow rates. Anticipate he would benefit from Creon for short gut once off TF -FWF; need to titrate based on I/O -appreciate surgery's  management -Anticipate he will require vitamin B12 injections.  Left closely monitor fat-soluble vitamin levels.  Hypertriglyceridemia due to propofol  infusion, now TPN  H/o alcohol abuse -vitamins, recommend total ETOH avoidance  Anemia of critical illness and previous ABLA from hemothorax, operative blood loss -transfuse for Hb <7 or hemodynamically significant bleeding  Agitation> situational due to prolonged illness> better Debility -CIR consulted -PT, OT, SLP -OOB mobility; has had trouble with cramping  Hyponatremia -need to ensure drinking fluids with electrolytes due to high fluid needs    DVT: heparin  gtt , warfarin GI: stopped-- would need to be restarted if he goes back on vent Lines:  PIVs   sodium chloride  20 mL/hr at 12/17/24 1412   cefTRIAXone  (ROCEPHIN )  IV Stopped (12/26/24 1018)   feeding supplement (VITAL 1.5 CAL) Stopped (12/24/24 1759)   feeding supplement (VITAL 1.5 CAL) 75 mL/hr at 12/26/24 1700     Tamela Stakes, MD  Attending Physician, Critical Care Medicine [facility] See Amion for pager If no response to pager, please call [phone] until 7pm After 7pm, Please call E-link [phone]  "

## 2024-12-26 NOTE — Progress Notes (Signed)
" ° °  [address] [place] [address]             [phone]   Sleeping this evening  BP 99/68   Pulse (!) 107   Temp 99.4 F (37.4 C) (Oral)   Resp (!) 22   Ht 5' 10 (1.778 m)   Wt 65.5 kg   SpO2 95%   BMI 20.70 kg/m    Intake/Output Summary (Last 24 hours) at 12/26/2024 1753 Last data filed at 12/26/2024 1733 Gross per 24 hour  Intake 3630.22 ml  Output 2630 ml  Net 1000.22 ml   Repeat hgb 8.2 unchanged  Elspeth C. Kerrin, MD [facility] [phone]  "

## 2024-12-26 NOTE — Progress Notes (Addendum)
 ANTICOAGULATION CONSULT NOTE  Pharmacy Consult for heparin  + warfarin Indication: bA/MVR  Allergies[1]  Patient Measurements: Height: 5' 10 (177.8 cm) Weight: 65.5 kg (144 lb 4.8 oz) IBW/kg (Calculated) : 73 Heparin  Dosing Weight: 70 kg   Vital Signs: Temp: 99 F (37.2 C) (01/03 0619) Temp Source: Oral (01/03 0619) BP: 107/87 (01/03 0500) Pulse Rate: 108 (01/03 0500)  Labs: Recent Labs    12/24/24 0143 12/24/24 0340 12/25/24 0116 12/25/24 0555 12/25/24 0953 12/26/24 0046  HGB 8.7*   < > 8.2* 8.5*  --  8.3*  HCT 25.9*   < > 25.0* 25.0*  --  25.4*  PLT 563*  --  520*  --   --  487*  LABPROT 15.6*  --  15.0  --   --  14.8  INR 1.2  --  1.1  --   --  1.1  HEPARINUNFRC 0.37  --  0.16*  --  0.36 0.42  CREATININE 0.53*  --  0.52*  --   --  0.50*   < > = values in this interval not displayed.    Estimated Creatinine Clearance: 108 mL/min (A) (by C-G formula based on SCr of 0.5 mg/dL (L)).  Assessment: 44 yo male presents s/p Impella-assisted MVR/AVR 12/8 with brief VT and ongoing ectopy on inotropes.  Postop course complicated by ischemic bowel s/p exlap 12/11 with partial small bowel resection, abdomen left open.  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 Impella 5.5 removed 12/24.  Per Dr. Lucas, plan for 3 months of OAC with dual valve replacement. Not on anticoagulation prior to admission.  Pharmacy consulted for heparin  and warfarin dosing.  12/26/24: INR 1.1, subtherapeutic after 4 doses of warfarin. Remains on continuous tube feeds and still will low oral intake for meals. Noted tube feeds can impact warfarin bioavailability. Heparin  infusion stopped this morning due to BRBPR. Hgb stable low at 8.3, PLT stable at 487. Discussed with HF MD, plan to continue with warfarin tonight since INR remains at baseline.  Goal of Therapy:  INR 2-3 Heparin  level 0.3-0.5 units/ml Monitor  platelets by anticoagulation protocol: Yes   Plan:  Hold heparin  infusion overnight, reassess initiation on 1/4 AM Warfarin 5 mg tonight Daily INR, heparin  level, CBC  Thank you for allowing pharmacy to participate in this patient's care,  Morna Breach, PharmD, BCPS PGY2 Cardiology Pharmacy Resident 12/26/2024 7:37 AM  Please check AMION for all [facility] Pharmacy phone numbers After 10:00 PM, call Main Pharmacy [phone]     [1] No Known Allergies

## 2024-12-26 NOTE — Progress Notes (Signed)
 10 Days Post-Op Procedures (LRB): REMOVAL, CARDIAC ASSIST DEVICE, IMPELLA (Right) Subjective: C/o abdominal pain, bloody stool per rectum this AM  Objective: Vital signs in last 24 hours: Temp:  [98 F (36.7 C)-99.9 F (37.7 C)] 99.6 F (37.6 C) (01/03 0751) Pulse Rate:  [87-108] 108 (01/03 0500) Cardiac Rhythm: Sinus tachycardia (01/02 2000) Resp:  [14-31] 23 (01/03 0500) BP: (85-119)/(55-89) 107/87 (01/03 0500) SpO2:  [94 %-99 %] 96 % (01/03 0500) FiO2 (%):  [28 %] 28 % (01/02 1040) Weight:  [65.5 kg] 65.5 kg (01/03 0619)  Hemodynamic parameters for last 24 hours:    Intake/Output from previous day: 01/02 0701 - 01/03 0700 In: 3916.8 [P.O.:620; I.V.:481.8; NG/GT:2715; IV Piggyback:100] Out: 2680 [Urine:1400; Drains:80; Stool:1200] Intake/Output this shift: No intake/output data recorded.  General appearance: alert, cooperative, and mild distress Neurologic: intact Heart: tachy, regular Lungs: diminished breath sounds bibasilar Abdomen: VAC in place  Lab Results: Recent Labs    12/25/24 0116 12/25/24 0555 12/26/24 0046  WBC 11.6*  --  10.8*  HGB 8.2* 8.5* 8.3*  HCT 25.0* 25.0* 25.4*  PLT 520*  --  487*   BMET:  Recent Labs    12/25/24 0116 12/25/24 0555 12/26/24 0046  NA 131* 133* 130*  K 4.6 4.6 5.0  CL 100  --  99  CO2 22  --  22  GLUCOSE 120*  --  103*  BUN 17  --  15  CREATININE 0.52*  --  0.50*  CALCIUM  8.6*  --  8.6*    PT/INR:  Recent Labs    12/26/24 0046  LABPROT 14.8  INR 1.1   ABG    Component Value Date/Time   PHART 7.404 12/25/2024 0555   HCO3 21.1 12/25/2024 0555   TCO2 22 12/25/2024 0555   ACIDBASEDEF 3.0 (H) 12/25/2024 0555   O2SAT 96 12/25/2024 0555   CBG (last 3)  No results for input(s): GLUCAP in the last 72 hours.  Assessment/Plan: S/P Procedures (LRB): REMOVAL, CARDIAC ASSIST DEVICE, IMPELLA (Right) S/P AVR/MVR with pericardial valves and Insertion of Impella 5.5 for postop support 11/30/24. S/P right  thoracentesis 12/03/24 complicated by massive right hemothorax due to intercostal artery laceration embolized by IR.  S/P Ex lap, 75 cm ileal resection and wound vac by GS for ischemic distal ileum 12/04/24. S/P re-exploration abdomen and right colectomy without anastomosis for ischemia 12/06/24. S/P re-exploration abdomen, limited distal small bowel resection and end ileostomy 12/08/24. S/P right VATS for decortication and drainage of retained hemothorax 12/10/24. S/P Percutaneous trach by CCM on 12/15/24 S/P removal of Impella 5.5 on 12/16/24.   CV- stable  Has been on heparin  drip RESP- sats good on room air GI- ostomy output normal, but BRBPR this AM  Hgb stable at 8.3  Repeat H&H at noon  Hold heparin  drip for now RENAL- creatinine OK  Hyponatremia- monitor ID- afebrile on ceftriaxone  Deconditioning- PT/OT   LOS: 33 days    Alejandro Lopez 12/26/2024

## 2024-12-26 NOTE — Progress Notes (Signed)
 10 Days Post-Op   Subjective/Chief Complaint: Developed some bleeding from his rectum overnight. Hb stable. Ostomy output 1.2L and without blood. Denies new complaints   Objective: Vital signs in last 24 hours: Temp:  [98 F (36.7 C)-99.9 F (37.7 C)] 99.6 F (37.6 C) (01/03 0751) Pulse Rate:  [87-108] 103 (01/03 0805) Resp:  [14-31] 24 (01/03 0805) BP: (85-119)/(55-89) 107/87 (01/03 0500) SpO2:  [94 %-99 %] 96 % (01/03 0500) FiO2 (%):  [28 %] 28 % (01/02 1040) Weight:  [65.5 kg] 65.5 kg (01/03 0619) Last BM Date : 12/25/24  Intake/Output from previous day: 01/02 0701 - 01/03 0700 In: 3916.8 [P.O.:620; I.V.:481.8; NG/GT:2715; IV Piggyback:100] Out: 2680 [Urine:1400; Drains:80; Stool:1200] Intake/Output this shift: No intake/output data recorded.  Abd vac in place, soft, ostomy productive with liquid stool  Lab Results:  Recent Labs    12/25/24 0116 12/25/24 0555 12/26/24 0046  WBC 11.6*  --  10.8*  HGB 8.2* 8.5* 8.3*  HCT 25.0* 25.0* 25.4*  PLT 520*  --  487*   BMET Recent Labs    12/25/24 0116 12/25/24 0555 12/26/24 0046  NA 131* 133* 130*  K 4.6 4.6 5.0  CL 100  --  99  CO2 22  --  22  GLUCOSE 120*  --  103*  BUN 17  --  15  CREATININE 0.52*  --  0.50*  CALCIUM  8.6*  --  8.6*   PT/INR Recent Labs    12/25/24 0116 12/26/24 0046  LABPROT 15.0 14.8  INR 1.1 1.1   ABG Recent Labs    12/24/24 0340 12/25/24 0555  PHART 7.413 7.404  HCO3 21.8 21.1    Studies/Results: ECHOCARDIOGRAM LIMITED Result Date: 12/25/2024    ECHOCARDIOGRAM LIMITED REPORT   Patient Name:   ROREY BISSON Date of Exam: 12/25/2024 Medical Rec #:  [id number]        Height:       70.0 in Accession #:    [id number]       Weight:       134.5 lb Date of Birth:  July 30, 1981       BSA:          1.763 m Patient Age:    43 years         BP:           105/69 mmHg Patient Gender: M                HR:           96 bpm. Exam Location:  Inpatient Procedure: Limited Echo, Cardiac Doppler and  Limited Color Doppler (Both            Spectral and Color Flow Doppler were utilized during procedure). Indications:    Pericardial Effusion  History:        Patient has prior history of Echocardiogram examinations, most                 recent 12/20/2024. Aortic Valve Disease and Mitral Valve                 Disease.                 Aortic Valve: 23 mm bioprosthetic valve is present in the aortic                 position. Procedure Date: 11/30/24.                 Mitral Valve: 27  mm bioprosthetic valve valve is present in the                 mitral position. Procedure Date: 11/30/24.  Sonographer:    Koleen Popper RDCS Referring Phys: MANUELITA GARRE [facility] IMPRESSIONS  1. Left ventricular ejection fraction, by estimation, is 50 to 55%. The left ventricle has low normal function. The left ventricular internal cavity size was mildly dilated. There is moderate eccentric left ventricular hypertrophy. Left ventricular diastolic function could not be evaluated.  2. The mitral valve has been repaired/replaced. There is a 27 mm bioprosthetic valve present in the mitral position. Procedure Date: 11/30/24.  3. Large, primarily posterior pericardial effusion. Reduced from prior. Large pericardial effusion. There is no evidence of cardiac tamponade.  4. The aortic valve has been repaired/replaced. There is a 23 mm bioprosthetic valve present in the aortic position. Procedure Date: 11/30/24. Echo findings are consistent with normal structure and function of the aortic valve prosthesis. Aortic valve Vmax measures 2.72 m/s.  5. There is normal pulmonary artery systolic pressure.  6. The inferior vena cava is normal in size with greater than 50% respiratory variability, suggesting right atrial pressure of 3 mmHg. FINDINGS  Left Ventricle: Left ventricular ejection fraction, by estimation, is 50 to 55%. The left ventricle has low normal function. The left ventricular internal cavity size was mildly dilated. There is moderate eccentric  left ventricular hypertrophy. Abnormal  (paradoxical) septal motion consistent with post-operative status. Left ventricular diastolic function could not be evaluated. Left ventricular diastolic function could not be evaluated due to mitral valve replacement. Right Ventricle: There is normal pulmonary artery systolic pressure. The tricuspid regurgitant velocity is 2.64 m/s, and with an assumed right atrial pressure of 3 mmHg, the estimated right ventricular systolic pressure is 30.9 mmHg. Pericardium: Large, primarily posterior pericardial effusion. Reduced from prior. A large pericardial effusion is present. There is no evidence of cardiac tamponade. Mitral Valve: The mitral valve has been repaired/replaced. There is a 27 mm bioprosthetic valve present in the mitral position. Procedure Date: 11/30/24. MV peak gradient, 17.1 mmHg. The mean mitral valve gradient is 9.0 mmHg. Tricuspid Valve: The tricuspid valve is normal in structure. Tricuspid valve regurgitation is mild. Aortic Valve: The aortic valve has been repaired/replaced. Aortic valve mean gradient measures 15.0 mmHg. Aortic valve peak gradient measures 29.5 mmHg. Aortic valve area, by VTI measures 1.70 cm. There is a 23 mm bioprosthetic valve present in the aortic position. Procedure Date: 11/30/24. Echo findings are consistent with normal structure and function of the aortic valve prosthesis. Venous: The inferior vena cava is normal in size with greater than 50% respiratory variability, suggesting right atrial pressure of 3 mmHg. Additional Comments: Spectral Doppler performed. Color Doppler performed.  LEFT VENTRICLE PLAX 2D LVIDd:         5.50 cm      Diastology LVIDs:         4.30 cm      LV e' medial:    7.05 cm/s LV PW:         1.20 cm      LV E/e' medial:  19.9 LV IVS:        1.50 cm      LV e' lateral:   6.30 cm/s LVOT diam:     1.90 cm      LV E/e' lateral: 22.2 LV SV:         64 LV SV Index:   36 LVOT Area:     2.84  cm  LV Volumes (MOD) LV vol d,  MOD A2C: 145.0 ml LV vol s, MOD A2C: 74.7 ml LV SV MOD A2C:     70.3 ml RIGHT VENTRICLE             IVC RV S prime:     10.20 cm/s  IVC diam: 1.60 cm LEFT ATRIUM         Index LA diam:    3.90 cm 2.21 cm/m  AORTIC VALVE AV Area (Vmax):    1.79 cm AV Area (Vmean):   1.78 cm AV Area (VTI):     1.70 cm AV Vmax:           271.50 cm/s AV Vmean:          176.500 cm/s AV VTI:            0.380 m AV Peak Grad:      29.5 mmHg AV Mean Grad:      15.0 mmHg LVOT Vmax:         171.00 cm/s LVOT Vmean:        111.000 cm/s LVOT VTI:          0.227 m LVOT/AV VTI ratio: 0.60  AORTA Ao Root diam: 3.70 cm MITRAL VALVE                TRICUSPID VALVE MV Area (PHT): 3.72 cm     TR Peak grad:   27.9 mmHg MV Area VTI:   1.46 cm     TR Vmax:        264.00 cm/s MV Peak grad:  17.1 mmHg MV Mean grad:  9.0 mmHg     SHUNTS MV Vmax:       2.07 m/s     Systemic VTI:  0.23 m MV Vmean:      143.0 cm/s   Systemic Diam: 1.90 cm MV Decel Time: 204 msec MV E velocity: 140.00 cm/s MV A velocity: 180.00 cm/s MV E/A ratio:  0.78 Morene Brownie Electronically signed by Morene Brownie Signature Date/Time: 12/25/2024/11:52:06 AM    Final     Anti-infectives: Anti-infectives (From admission, onward)    Start     Dose/Rate Route Frequency Ordered Stop   12/23/24 1100  vancomycin  (VANCOREADY) IVPB 1250 mg/250 mL  Status:  Discontinued        1,250 mg 166.7 mL/hr over 90 Minutes Intravenous Every 12 hours 12/23/24 0928 12/24/24 1257   12/23/24 0900  cefTRIAXone  (ROCEPHIN ) 2 g in sodium chloride  0.9 % 100 mL IVPB        2 g 200 mL/hr over 30 Minutes Intravenous Every 24 hours 12/23/24 0806 12/29/24 2359   12/23/24 0830  piperacillin -tazobactam (ZOSYN ) IVPB 3.375 g  Status:  Discontinued        3.375 g 12.5 mL/hr over 240 Minutes Intravenous Every 8 hours 12/23/24 0742 12/23/24 0806   12/15/24 1400  ceFEPIme  (MAXIPIME ) 2 g in sodium chloride  0.9 % 100 mL IVPB        2 g 200 mL/hr over 30 Minutes Intravenous Every 8 hours 12/15/24 1256  12/18/24 2258   12/14/24 1830  micafungin  (MYCAMINE ) 200 mg in sodium chloride  0.9 % 100 mL IVPB  Status:  Discontinued        200 mg 110 mL/hr over 1 Hours Intravenous  Once 12/14/24 1738 12/14/24 1743   12/14/24 1830  micafungin  (MYCAMINE ) 100 mg in sodium chloride  0.9 % 100 mL IVPB        100 mg  105 mL/hr over 1 Hours Intravenous Every 24 hours 12/14/24 1738 12/20/24 1922   12/13/24 2000  vancomycin  (VANCOREADY) IVPB 1500 mg/300 mL  Status:  Discontinued        1,500 mg 150 mL/hr over 120 Minutes Intravenous Every 24 hours 12/13/24 1237 12/14/24 1418   12/13/24 0720  vancomycin  variable dose per unstable renal function (pharmacist dosing)  Status:  Discontinued         Does not apply See admin instructions 12/13/24 0720 12/13/24 1237   12/12/24 1630  meropenem  (MERREM ) 1 g in sodium chloride  0.9 % 100 mL IVPB  Status:  Discontinued        1 g 200 mL/hr over 30 Minutes Intravenous Every 8 hours 12/12/24 1534 12/15/24 1256   12/10/24 1000  micafungin  (MYCAMINE ) 100 mg in sodium chloride  0.9 % 100 mL IVPB  Status:  Discontinued       Placed in Followed by Linked Group   100 mg 105 mL/hr over 1 Hours Intravenous Every 24 hours 12/09/24 0704 12/11/24 0710   12/09/24 0830  micafungin  (MYCAMINE ) 200 mg in sodium chloride  0.9 % 100 mL IVPB       Placed in Followed by Linked Group   200 mg 110 mL/hr over 1 Hours Intravenous  Once 12/09/24 0704 12/09/24 1101   12/06/24 2200  vancomycin  (VANCOREADY) IVPB 1250 mg/250 mL  Status:  Discontinued        1,250 mg 166.7 mL/hr over 90 Minutes Intravenous Every 12 hours 12/06/24 0811 12/13/24 0709   12/06/24 0900  vancomycin  (VANCOREADY) IVPB 1750 mg/350 mL        1,750 mg 175 mL/hr over 120 Minutes Intravenous  Once 12/06/24 0802 12/06/24 1032   12/04/24 1100  vancomycin  (VANCOCIN ) IVPB 1000 mg/200 mL premix  Status:  Discontinued        1,000 mg 200 mL/hr over 60 Minutes Intravenous Every 12 hours 12/03/24 2238 12/03/24 2242   12/04/24 0000   vancomycin  (VANCOREADY) IVPB 1500 mg/300 mL  Status:  Discontinued        1,500 mg 150 mL/hr over 120 Minutes Intravenous  Once 12/03/24 2236 12/03/24 2242   12/03/24 2300  piperacillin -tazobactam (ZOSYN ) IVPB 3.375 g  Status:  Discontinued        3.375 g 12.5 mL/hr over 240 Minutes Intravenous Every 8 hours 12/03/24 2236 12/12/24 1534   11/30/24 2130  vancomycin  (VANCOCIN ) IVPB 1000 mg/200 mL premix        1,000 mg 200 mL/hr over 60 Minutes Intravenous  Once 11/30/24 1609 11/30/24 2351   11/30/24 1615  ceFAZolin  (ANCEF ) IVPB 2g/100 mL premix        2 g 200 mL/hr over 30 Minutes Intravenous Every 8 hours 11/30/24 1609 12/02/24 0546   11/30/24 0400  vancomycin  (VANCOREADY) IVPB 1250 mg/250 mL        1,250 mg 166.7 mL/hr over 90 Minutes Intravenous To Surgery 11/29/24 1040 11/30/24 0856   11/30/24 0400  ceFAZolin  (ANCEF ) IVPB 2g/100 mL premix        2 g 200 mL/hr over 30 Minutes Intravenous To Surgery 11/29/24 1040 11/30/24 1239   11/30/24 0400  ceFAZolin  (ANCEF ) IVPB 2g/100 mL premix  Status:  Discontinued        2 g 200 mL/hr over 30 Minutes Intravenous To Surgery 11/29/24 1036 11/30/24 1609       Assessment/Plan: 44 y/o M S/P AVR, MCR, Impella placement by Dr. Lucas 12/8  S/P right thoracentesis 13/12 with hemorrhage from intercostal -chest tube  placed by TCTS.  S/p emergent IR embolizaton  R intercostal artery by Dr. Philip.    S/P VATS 12/18    Pneumatosis of the pelvic small bowel and R colon with portal venous gas on CT  POD 20/18/16 status post ex lap with SBR & VAC placement, repeat laparotomy with patchy small bowel ischemia, end ileostomy/ab closure - WBC 11 and stable - ostomy output 1.2L.  On banatrol TID, imodium  from 4 mg q 6h, lomotil  2 mg QID. CCM added relizorb yesterday. Continue current regimen.  - woc following - vac changes twice weekly - TF @ goal and now on DYS 3 diet; gradual transition to nocturnal TF per primary team, discussed with Dr. Gretta - Developed  bleeding per rectum overnight. Hb stable. Heparin  gtt and TF held. Will follow up repeat CBC this afternoon.   FEN: DYS 3, IVF/free water  per primary,  TF as above ID: cefepime , micafungin  completed; resp cx 12/18 with few C albican, BCx 12/17 neg, BAL 12/23 rare yeast; given WBC and some resp bacteria on gram stain, CCM starting 7 d abx (Rocephin ) on 12/30 VTE: coumadin   Foley: removed, spont voids Dispo: ICU, possible CIR soon, stable for D/C to CIR from CCS standpoint, we can follow ostomy and nutrition while there.  Cordella DELENA Idler 12/26/2024

## 2024-12-26 NOTE — Progress Notes (Signed)
 "    Advanced Heart Failure Rounding Note  Cardiologist: None  Chief Complaint: Valvular Heart Disease & HFrEF Patient Profile   44 y.o. male w/ h/o heavy alcohol use (at least 5 drinks per evening) and h/o asthma admitted w/ acute systolic heart failure. Strong family history of cardiac disease: mother and maternal grandmother had heart failure and father with CAD. Admitted with acute HFrEF/cardiogenic shock. Echo w/ severe AR, MR and LV dysfunction.    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  12/9 VT/VF ? vagal episode. Extubated 12/11: S/p R thoracentesis with resultant hemothorax.  Chest tube placed. S/p R intercostal artery coil (IR). S/p exp lap for ischemic bowel resection, bowel left in discontinuity 12/15: Back to OR for ischemic R colon and mesenteric ischemia. S/p exp/lap, wound vac exchange, R colectomy without anastomosis.  12/16 Back to OR for small bowel resection and ileostomy  12/18 OR for VATS/hemothorax washout, pericardial window was not done.  12/19 extubated 12/21 Reintubated. Ileus 12/23 S/p Trach. Fever post procedure, Tmax 101. Abx switched to cefepime    12/24 Impella removed 12/25: DBA stopped. Diuretics held d/t low CVPs 12/26: Chest tube/foley out  Subjective:    Started 7 day course of ceftriaxone  12/31.  Remains afebrile.   Bloody output from rectum overnight. Heparin  held, surgery following. Capping trial yesterday. PO intake slowly improving.      Objective:    Weight Range: 65.5 kg Body mass index is 20.7 kg/m.   Vital Signs:   Temp:  [98 F (36.7 C)-99.9 F (37.7 C)] 99.6 F (37.6 C) (01/03 0751) Pulse Rate:  [87-114] 101 (01/03 0952) Resp:  [14-31] 24 (01/03 0805) BP: (85-119)/(55-89) 108/81 (01/03 0800) SpO2:  [94 %-99 %] 96 % (01/03 0800) FiO2 (%):  [28 %] 28 % (01/02 1040) Weight:  [65.5 kg] 65.5 kg (01/03 0619) Last BM Date : 12/26/24  Weight change: Filed Weights   12/24/24 0500  12/25/24 0600 12/26/24 0619  Weight: 64.6 kg 61 kg 65.5 kg   Intake/Output:  Intake/Output Summary (Last 24 hours) at 12/26/2024 1036 Last data filed at 12/26/2024 0900 Gross per 24 hour  Intake 3480.22 ml  Output 2380 ml  Net 1100.22 ml    Physical Exam   GENERAL: NAD, well appearing PULM:  Normal work of breathing, CTAB CARDIAC:  JVP: flat         Normal rate with regular rhythm. Systolic mumrur.  No edema. Warm and well perfused extremities. ABDOMEN: Soft, non-tender, non-distended. NEUROLOGIC: Patient is oriented x3 with no focal or lateralizing neurologic deficits.       Telemetry    SR 90s  Labs    CBC Recent Labs    12/25/24 0116 12/25/24 0555 12/26/24 0046  WBC 11.6*  --  10.8*  HGB 8.2* 8.5* 8.3*  HCT 25.0* 25.0* 25.4*  MCV 89.9  --  89.8  PLT 520*  --  487*   Basic Metabolic Panel Recent Labs    98/97/73 0116 12/25/24 0555 12/26/24 0046  NA 131* 133* 130*  K 4.6 4.6 5.0  CL 100  --  99  CO2 22  --  22  GLUCOSE 120*  --  103*  BUN 17  --  15  CREATININE 0.52*  --  0.50*  CALCIUM  8.6*  --  8.6*  MG 1.4*  --  1.7   Liver Function Tests No results for input(s): AST, ALT, ALKPHOS, BILITOT, PROT, ALBUMIN  in the last 72 hours.  ProBNP (last 3 results) Recent Labs    11/22/24 1958  PROBNP 3,354.0*   Fasting Lipid Panel No results for input(s): CHOL, HDL, LDLCALC, TRIG, CHOLHDL, LDLDIRECT in the last 72 hours.  Medications:    Scheduled Medications:  amiodarone   200 mg Per Tube BID   arformoterol   15 mcg Nebulization BID   Chlorhexidine  Gluconate Cloth  6 each Topical Daily   colchicine   0.3 mg Oral Daily   [START ON 12/28/2024] cyanocobalamin   1,000 mcg Intramuscular Weekly   digoxin   0.0625 mg Per Tube Daily   diphenoxylate -atropine   1 tablet Per Tube QID   feeding supplement (PROSource TF20)  60 mL Per Tube BID   fiber supplement (BANATROL TF)  60 mL Per Tube QID   folic acid   1 mg Oral QHS   free water   100 mL  Per Tube Q4H   Gerhardt's butt cream   Topical TID   lidocaine   1 patch Transdermal Q24H   loperamide  HCl  4 mg Per Tube Q6H   losartan   25 mg Per Tube Daily   multivitamin with minerals  1 tablet Per Tube Daily   mouth rinse  15 mL Mouth Rinse Q2H   pantoprazole  (PROTONIX ) IV  40 mg Intravenous Daily   polycarbophil  625 mg Per Tube Daily   QUEtiapine   25 mg Per Tube QHS   Relizorb  1 Cartridge Does not apply Q0600   Relizorb  2 Cartridge Does not apply Q1400   revefenacin   175 mcg Nebulization Daily   scopolamine   1 patch Transdermal Q72H   sodium chloride  flush  3 mL Intravenous Q12H   spironolactone   25 mg Per Tube Daily   thiamine   100 mg Oral QHS   Warfarin - Pharmacist Dosing Inpatient   Does not apply q1600    Infusions:  sodium chloride  20 mL/hr at 12/17/24 1412   cefTRIAXone  (ROCEPHIN )  IV 2 g (12/26/24 0947)   feeding supplement (VITAL 1.5 CAL) Stopped (12/24/24 1759)   feeding supplement (VITAL 1.5 CAL) 75 mL/hr at 12/26/24 0900   heparin  Stopped (12/26/24 0749)    PRN Medications: sodium chloride , acetaminophen  (TYLENOL ) oral liquid 160 mg/5 mL, albuterol , LORazepam , morphine  injection, ondansetron  (ZOFRAN ) IV, mouth rinse, oxyCODONE , polyethylene glycol, simethicone , sodium chloride , sodium chloride  flush  Assessment/Plan   S/P AVR/MVR with Impella 5.5 placed.  Valvular Disease  - severe MR posteriorly directed, mod TR. Likely functional.  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 On trach collar 24/7 at this point, capping trial yesterday  Acute Systolic Heart Failure w/ Biventricular Dysfunction >> Cardiogenic Shock - HS trop not c/w ACS but EKG w/ anteroseptal Qs  - CMRI Severe AR/MR. LVEF 35%  - Cath- normal cors, RA5, PA 32/17 (24), PVR 2.57 Papi 3.  CO 4.9 CI 3.8 - Post-op Echo 12/02/24: EF < 20% with severe LVH (new, ?inflammatory), moderately decreased RV function, stable bioprosthetic MV and AoV.  - Impella removed 12/24  - Ltd echo 12/28: EF 40-45%, mildly reduced RV - Euvolemic - continue spironolactone  25 mg daily, K 5.0, follow for now - continue losartan  25 mg daily - Decreased digoxin  to 0.0625 mg daily, dig level 0.9 1/2 - No BP room to titrate further  Ischemic bowel - S/p exp/lap 12/11. Patchy ischemia of the distal  ileum extending over about 75 cm without perforation. Ischemic segment resected.  Colon was distended with gas but viable. Small bowel left in discontinuity.  - Back to OR 12/15 for ischemic R colon and mesenteric ischemia. S/p exp/lap, wound vac exchange, R colectomy without anastomosis.  - Returned to OR 12/16 for ileostomy creation.  - GSU following. Ileostomy working well, continues w/ high output. On lomotil , banatrol and imodium  - TPN off. On TF. Now eating solid food. TF on hold with BRBPR - Hold heparin  gtt for now, repeat hemoglobin this afternoon, hgb stable  R hemothorax: resolved - Hemothorax s/p right thoracentesis with intercostal artery injury. Massive bleeding requiring multiple products and development of hemorrhagic shock, now improved. S/p IR embolization.  - S/p VATS 12/18 with right chest washout.  - H/H stable. CT out.  ETOH Abuse - heavy drinker, at least 5 drinks per evening - may be contributing to CM. Did not withdrawal - cessation needed after discharge   DMII  - Hgb A1C 6.1 - On SSI.  ID - BAL w/ enterobacter cloacae and candida albicans. Zosyn  > meropenem  (12/21 to 12/23) - Completed cefepime  (12/23 to 12/26) - Completed micafungin  (12/22 to 12/29) - Restarted CTX on 12/31 for low fevers, secretions. WBC count improving, 7 day course  AKI - Resolved.  VT - Quiescent - continue amio 200 mg bid  Pericardial effusion - Echo 12/26 moderate effusion without  tamponade - Limited echo 12/17 with moderate effusion.  - This was not drained at time of VATS/right hemothorax washout on 12/18. Appeared fibrinous. - Ltd echo 12/28 with increased, large pericardial effusion, no tamponade - continue colchicine  0.3 mg daily - Repeat limited echo 1/2 reviewed, no change  12 Atrial flutter - Prev in/out of AFL with controlled rate.  - Remains in NSR. Continue amio 200 mg bid - Heparin  held today. Warfarin started 12/30, INR 1.1, discussed dosing with pharmacy. Absorption an issue.   Continue aggressive PT/OT. Eventual CIR.   Length of Stay: 53   Morene JINNY Brownie, MD  12/26/2024, 10:36 AM  Advanced Heart Failure Team Pager [phone] (M-F; 7a - 5p)   Please visit [email or web address]: For overnight coverage please call cardiology fellow first. If fellow not available call Shock/ECMO MD on call.  For ECMO / Mechanical Support (Impella, IABP, LVAD) issues call Shock / ECMO MD on call.   "

## 2024-12-27 LAB — PROTIME-INR
INR: 1.1 (ref 0.8–1.2)
Prothrombin Time: 14.5 s (ref 11.4–15.2)

## 2024-12-27 LAB — PREALBUMIN: Prealbumin: 18 mg/dL (ref 18–38)

## 2024-12-27 LAB — CBC
HCT: 24.1 % — ABNORMAL LOW (ref 39.0–52.0)
Hemoglobin: 7.9 g/dL — ABNORMAL LOW (ref 13.0–17.0)
MCH: 29.7 pg (ref 26.0–34.0)
MCHC: 32.8 g/dL (ref 30.0–36.0)
MCV: 90.6 fL (ref 80.0–100.0)
Platelets: 428 K/uL — ABNORMAL HIGH (ref 150–400)
RBC: 2.66 MIL/uL — ABNORMAL LOW (ref 4.22–5.81)
RDW: 16.9 % — ABNORMAL HIGH (ref 11.5–15.5)
WBC: 11.2 K/uL — ABNORMAL HIGH (ref 4.0–10.5)
nRBC: 0 % (ref 0.0–0.2)

## 2024-12-27 LAB — ALBUMIN: Albumin: 2.6 g/dL — ABNORMAL LOW (ref 3.5–5.0)

## 2024-12-27 LAB — BASIC METABOLIC PANEL WITH GFR
Anion gap: 9 (ref 5–15)
BUN: 17 mg/dL (ref 6–20)
CO2: 21 mmol/L — ABNORMAL LOW (ref 22–32)
Calcium: 8.8 mg/dL — ABNORMAL LOW (ref 8.9–10.3)
Chloride: 98 mmol/L (ref 98–111)
Creatinine, Ser: 0.55 mg/dL — ABNORMAL LOW (ref 0.61–1.24)
GFR, Estimated: >60 mL/min
Glucose, Bld: 127 mg/dL — ABNORMAL HIGH (ref 70–99)
Potassium: 4.9 mmol/L (ref 3.5–5.1)
Sodium: 129 mmol/L — ABNORMAL LOW (ref 135–145)

## 2024-12-27 LAB — MAGNESIUM: Magnesium: 1.8 mg/dL (ref 1.7–2.4)

## 2024-12-27 MED ORDER — MAGNESIUM SULFATE 2 GM/50ML IV SOLN
2.0000 g | Freq: Once | INTRAVENOUS | Status: AC
  Administered 2024-12-27: 2 g via INTRAVENOUS
  Filled 2024-12-27: qty 50

## 2024-12-27 MED ORDER — THIAMINE MONONITRATE 100 MG PO TABS
100.0000 mg | ORAL_TABLET | Freq: Every day | ORAL | Status: DC
  Administered 2024-12-27: 100 mg
  Filled 2024-12-27: qty 1

## 2024-12-27 MED ORDER — WARFARIN SODIUM 5 MG PO TABS
10.0000 mg | ORAL_TABLET | Freq: Once | ORAL | Status: AC
  Administered 2024-12-27: 10 mg via ORAL
  Filled 2024-12-27: qty 2

## 2024-12-27 MED ORDER — COLCHICINE 0.3 MG HALF TABLET
0.3000 mg | ORAL_TABLET | Freq: Every day | ORAL | Status: DC
  Administered 2024-12-27 – 2024-12-28 (×2): 0.3 mg
  Filled 2024-12-27 (×2): qty 1

## 2024-12-27 NOTE — Progress Notes (Signed)
" ° °  [address] [place] [address]             [phone]   Sleeping currently  BP 91/68   Pulse (!) 106   Temp 99 F (37.2 C) (Oral)   Resp (!) 24   Ht 5' 10 (1.778 m)   Wt 65.1 kg   SpO2 99%   BMI 20.59 kg/m    Intake/Output Summary (Last 24 hours) at 12/27/2024 1755 Last data filed at 12/27/2024 1714 Gross per 24 hour  Intake 2201.25 ml  Output 3295 ml  Net -1093.75 ml   Heparin  on hold for GI bleeding- probably resume tomorrow AM  Elspeth C. Kerrin, MD [facility] [phone]    "

## 2024-12-27 NOTE — Progress Notes (Signed)
 "    Advanced Heart Failure Rounding Note  Cardiologist: None  Chief Complaint: Valvular Heart Disease & HFrEF Patient Profile   44 y.o. male w/ h/o heavy alcohol use (at least 5 drinks per evening) and h/o asthma admitted w/ acute systolic heart failure. Strong family history of cardiac disease: mother and maternal grandmother had heart failure and father with CAD. Admitted with acute HFrEF/cardiogenic shock. Echo w/ severe AR, MR and LV dysfunction.    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  12/9 VT/VF ? vagal episode. Extubated 12/11: S/p R thoracentesis with resultant hemothorax.  Chest tube placed. S/p R intercostal artery coil (IR). S/p exp lap for ischemic bowel resection, bowel left in discontinuity 12/15: Back to OR for ischemic R colon and mesenteric ischemia. S/p exp/lap, wound vac exchange, R colectomy without anastomosis.  12/16 Back to OR for small bowel resection and ileostomy  12/18 OR for VATS/hemothorax washout, pericardial window was not done.  12/19 extubated 12/21 Reintubated. Ileus 12/23 S/p Trach. Fever post procedure, Tmax 101. Abx switched to cefepime    12/24 Impella removed 12/25: DBA stopped. Diuretics held d/t low CVPs 12/26: Chest tube/foley out  Subjective:    Started 7 day course of ceftriaxone  12/31.  Bloody output from the rectum overnight, but hemoglobin stable. Otherwise doing well.       Objective:    Weight Range: 65.1 kg Body mass index is 20.59 kg/m.   Vital Signs:   Temp:  [97.5 F (36.4 C)-99.4 F (37.4 C)] 97.5 F (36.4 C) (01/04 1142) Pulse Rate:  [97-121] 102 (01/04 1200) Resp:  [20-43] 24 (01/04 1433) BP: (82-141)/(54-88) 105/76 (01/04 1215) SpO2:  [93 %-99 %] 99 % (01/04 1200) FiO2 (%):  [21 %] 21 % (01/04 1433) Weight:  [65.1 kg] 65.1 kg (01/04 0500) Last BM Date : 12/27/23  Weight change: Filed Weights   12/25/24 0600 12/26/24 0619 12/27/24 0500  Weight: 61 kg 65.5 kg 65.1  kg   Intake/Output:  Intake/Output Summary (Last 24 hours) at 12/27/2024 1436 Last data filed at 12/27/2024 1200 Gross per 24 hour  Intake 2201.25 ml  Output 2745 ml  Net -543.75 ml    Physical Exam   GENERAL: NAD, resting comfortable appearing PULM:  Normal work of breathing, CTAB CARDIAC:  JVP: flat         Normal rate with regular rhythm. Systolic mumrur.  No edema. Warm and well perfused extremities. ABDOMEN: Soft, non-tender, ostomy in place NEUROLOGIC: Patient is oriented x3 with no focal or lateralizing neurologic deficits.       Telemetry    SR 90s  Labs    CBC Recent Labs    12/26/24 0046 12/26/24 1158 12/27/24 0126  WBC 10.8*  --  11.2*  HGB 8.3* 8.2* 7.9*  HCT 25.4* 24.8* 24.1*  MCV 89.8  --  90.6  PLT 487*  --  428*   Basic Metabolic Panel Recent Labs    98/96/73 0046 12/27/24 0126  NA 130* 129*  K 5.0 4.9  CL 99 98  CO2 22 21*  GLUCOSE 103* 127*  BUN 15 17  CREATININE 0.50* 0.55*  CALCIUM  8.6* 8.8*  MG 1.7 1.8   Liver Function Tests Recent Labs    12/27/24 1016  ALBUMIN  2.6*    ProBNP (last 3 results) Recent Labs    11/22/24 1958  PROBNP 3,354.0*   Fasting Lipid Panel No results for input(s): CHOL, HDL, LDLCALC, TRIG, CHOLHDL, LDLDIRECT in the  last 72 hours.  Medications:    Scheduled Medications:  amiodarone   200 mg Per Tube BID   arformoterol   15 mcg Nebulization BID   Chlorhexidine  Gluconate Cloth  6 each Topical Daily   colchicine   0.3 mg Per Tube Daily   [START ON 12/28/2024] cyanocobalamin   1,000 mcg Intramuscular Weekly   digoxin   0.0625 mg Per Tube Daily   diphenoxylate -atropine   1 tablet Per Tube QID   feeding supplement (PROSource TF20)  60 mL Per Tube BID   fiber supplement (BANATROL TF)  60 mL Per Tube QID   folic acid   1 mg Oral QHS   free water   100 mL Per Tube Q4H   Gerhardt's butt cream   Topical TID   lidocaine   1 patch Transdermal Q24H   loperamide  HCl  4 mg Per Tube Q6H   losartan   25 mg Per  Tube Daily   multivitamin with minerals  1 tablet Per Tube Daily   mouth rinse  15 mL Mouth Rinse Q2H   pantoprazole  (PROTONIX ) IV  40 mg Intravenous Daily   polycarbophil  625 mg Per Tube Daily   QUEtiapine   25 mg Per Tube QHS   Relizorb  1 Cartridge Does not apply Q0600   Relizorb  2 Cartridge Does not apply Q1400   revefenacin   175 mcg Nebulization Daily   scopolamine   1 patch Transdermal Q72H   sodium chloride  flush  3 mL Intravenous Q12H   spironolactone   25 mg Per Tube Daily   thiamine   100 mg Per Tube QHS   Warfarin - Pharmacist Dosing Inpatient   Does not apply q1600    Infusions:  sodium chloride  20 mL/hr at 12/17/24 1412   cefTRIAXone  (ROCEPHIN )  IV Stopped (12/27/24 0951)   feeding supplement (VITAL 1.5 CAL) 45 mL/hr at 12/27/24 1200   feeding supplement (VITAL 1.5 CAL) Stopped (12/27/24 0545)    PRN Medications: sodium chloride , acetaminophen  (TYLENOL ) oral liquid 160 mg/5 mL, albuterol , LORazepam , morphine  injection, ondansetron  (ZOFRAN ) IV, mouth rinse, oxyCODONE , polyethylene glycol, simethicone , sodium chloride , sodium chloride  flush  Assessment/Plan   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  Valvular Disease  - severe MR posteriorly directed, mod TR. Likely functional.  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 On trach collar 24/7 at this point, capping trials ongoing  Acute Systolic Heart Failure w/ Biventricular Dysfunction >> Cardiogenic Shock - HS trop not c/w ACS but EKG w/ anteroseptal Qs  - CMRI Severe AR/MR. LVEF 35%  - Cath- normal cors, RA5, PA 32/17 (24), PVR 2.57 Papi 3. CO 4.9 CI 3.8 - Post-op Echo 12/02/24: EF < 20% with severe LVH (new, ?inflammatory), moderately decreased RV function, stable bioprosthetic MV and AoV.  - Impella removed 12/24   - Ltd echo 12/28: EF 40-45%, mildly reduced RV - Euvolemic - continue spironolactone  25 mg daily, K 4.9, follow for now - continue losartan  25 mg daily - Decreased digoxin  to 0.0625 mg daily, dig level 0.9 1/2 - No BP room to titrate further - Stable from a HF standpoint for CIR  Ischemic bowel - S/p exp/lap 12/11. Patchy ischemia of the distal ileum extending over about 75 cm without perforation. Ischemic segment resected.  Colon was distended with gas but viable. Small bowel left in discontinuity.  - Back to  OR 12/15 for ischemic R colon and mesenteric ischemia. S/p exp/lap, wound vac exchange, R colectomy without anastomosis.  - Returned to OR 12/16 for ileostomy creation.  - GSU following. Ileostomy working well, output imrpoving - TPN off. On TF. Now eating solid food. TF on hold with BRBPR - Potentially restart heparin  tomorrow if hemoglobin stable - Continue warfarin  R hemothorax: resolved - Hemothorax s/p right thoracentesis with intercostal artery injury. Massive bleeding requiring multiple products and development of hemorrhagic shock, now improved. S/p IR embolization.  - S/p VATS 12/18 with right chest washout.  - H/H stable. CT out.  ETOH Abuse - heavy drinker, at least 5 drinks per evening - may be contributing to CM. Did not withdrawal - cessation needed after discharge   DMII  - Hgb A1C 6.1 - On SSI.  ID - BAL w/ enterobacter cloacae and candida albicans. Zosyn  > meropenem  (12/21 to 12/23) - Completed cefepime  (12/23 to 12/26) - Completed micafungin  (12/22 to 12/29) - Restarted CTX on 12/31 for low fevers, secretions. WBC count improving, 7 day course  AKI - Resolved.  VT - Quiescent - continue amio 200 mg bid  Pericardial effusion - Echo 12/26 moderate effusion without tamponade - Limited echo 12/17 with moderate effusion.  - This was not drained at time of VATS/right hemothorax washout on 12/18. Appeared fibrinous. - Ltd echo 12/28 with increased,  large pericardial effusion, no tamponade - continue colchicine  0.3 mg daily - Repeat limited echo 1/2 reviewed, no change  12 Atrial flutter - Prev in/out of AFL with controlled rate.  - Remains in NSR. Continue amio 200 mg bid - Heparin  held today. Warfarin started 12/30, INR 1.1 still, continue after discussion with pharmacy. Likely back on heparin  tomorrow   Continue aggressive PT/OT. Eventual CIR. Stable to go from a HF standpoint   Length of Stay: 27   Morene JINNY Brownie, MD  12/27/2024, 2:36 PM  Advanced Heart Failure Team Pager [phone] (M-F; 7a - 5p)   Please visit [email or web address]: For overnight coverage please call cardiology fellow first. If fellow not available call Shock/ECMO MD on call.  For ECMO / Mechanical Support (Impella, IABP, LVAD) issues call Shock / ECMO MD on call.   "

## 2024-12-27 NOTE — Progress Notes (Addendum)
 ANTICOAGULATION CONSULT NOTE  Pharmacy Consult for heparin  + warfarin Indication: bA/MVR  Allergies[1]  Patient Measurements: Height: 5' 10 (177.8 cm) Weight: 65.1 kg (143 lb 8.3 oz) IBW/kg (Calculated) : 73 Heparin  Dosing Weight: 70 kg   Vital Signs: Temp: 97.5 F (36.4 C) (01/04 1142) Temp Source: Oral (01/04 1142) BP: 99/69 (01/04 1100) Pulse Rate: 102 (01/04 1200)  Labs: Recent Labs    12/25/24 0116 12/25/24 0555 12/25/24 0953 12/26/24 0046 12/26/24 1158 12/27/24 0126  HGB 8.2*   < >  --  8.3* 8.2* 7.9*  HCT 25.0*   < >  --  25.4* 24.8* 24.1*  PLT 520*  --   --  487*  --  428*  LABPROT 15.0  --   --  14.8  --  14.5  INR 1.1  --   --  1.1  --  1.1  HEPARINUNFRC 0.16*  --  0.36 0.42  --   --   CREATININE 0.52*  --   --  0.50*  --  0.55*   < > = values in this interval not displayed.    Estimated Creatinine Clearance: 107.4 mL/min (A) (by C-G formula based on SCr of 0.55 mg/dL (L)).  Assessment: 44 yo male presents s/p Impella-assisted MVR/AVR 12/8 with brief VT and ongoing ectopy on inotropes.  Postop course complicated by ischemic bowel s/p exlap 12/11 with partial small bowel resection, abdomen left open.  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 Impella 5.5 removed 12/24.  Per Dr. Lucas, plan for 3 months of OAC with dual valve replacement. Not on anticoagulation prior to admission.  Pharmacy consulted for heparin  and warfarin dosing.  12/27/24: INR 1.1, remains at baseline and subtherapeutic after 5 doses of warfarin. Heparin  infusion paused yesterday morning after BRBPR. Patient with another bright red bowel movement overnight per RN. Discussed with HF MD, continue to hold heparin  today, but plan to proceed with warfarin given INR remains low. Will re-assess appropriateness of heparin  tomorrow.   Goal of Therapy:  INR 2-3 Heparin  level 0.3-0.5 units/ml Monitor platelets by  anticoagulation protocol: Yes   Plan:  Hold heparin  infusion overnight, reassess initiation on 1/5 AM Warfarin 10 mg x1 today Daily INR, CBC, and s/sx of bleeding  Thank you for allowing pharmacy to participate in this patient's care,  Morna Breach, PharmD, BCPS PGY2 Cardiology Pharmacy Resident 12/27/2024 12:15 PM  Please check AMION for all [facility] phone numbers After 10:00 PM, call Main Pharmacy [phone]    [1] No Known Allergies

## 2024-12-27 NOTE — Plan of Care (Signed)

## 2024-12-27 NOTE — Progress Notes (Addendum)
 11 Days Post-Op   Subjective/Chief Complaint: He did pass more clots from his rectum yesterday and overnight. Hb 7.9 and stable. Ileostomy output 1.2L.   On exam, patient is resting in bed. NAD. Wants to know when he get get the feeding tube removed.    Objective: Vital signs in last 24 hours: Temp:  [98.1 F (36.7 C)-99.6 F (37.6 C)] 99 F (37.2 C) (01/04 0721) Pulse Rate:  [98-121] 107 (01/04 0600) Resp:  [19-43] 30 (01/04 0600) BP: (82-141)/(54-88) 109/62 (01/04 0600) SpO2:  [92 %-98 %] 94 % (01/04 0600) FiO2 (%):  [21 %] 21 % (01/03 1421) Weight:  [65.1 kg] 65.1 kg (01/04 0500) Last BM Date : 12/26/24  Intake/Output from previous day: 01/03 0701 - 01/04 0700 In: 2619.6 [I.V.:38.4; NG/GT:2381.3; IV Piggyback:200] Out: 2425 [Urine:1300; Stool:1125] Intake/Output this shift: No intake/output data recorded.  Abd vac in place, soft, ostomy productive with liquid stool  Lab Results:  Recent Labs    12/26/24 0046 12/26/24 1158 12/27/24 0126  WBC 10.8*  --  11.2*  HGB 8.3* 8.2* 7.9*  HCT 25.4* 24.8* 24.1*  PLT 487*  --  428*   BMET Recent Labs    12/26/24 0046 12/27/24 0126  NA 130* 129*  K 5.0 4.9  CL 99 98  CO2 22 21*  GLUCOSE 103* 127*  BUN 15 17  CREATININE 0.50* 0.55*  CALCIUM  8.6* 8.8*   PT/INR Recent Labs    12/26/24 0046 12/27/24 0126  LABPROT 14.8 14.5  INR 1.1 1.1   ABG Recent Labs    12/25/24 0555  PHART 7.404  HCO3 21.1    Studies/Results: ECHOCARDIOGRAM LIMITED Result Date: 12/25/2024    ECHOCARDIOGRAM LIMITED REPORT   Patient Name:   Alejandro Lopez Date of Exam: 12/25/2024 Medical Rec #:  [id number]        Height:       70.0 in Accession #:    [id number]       Weight:       134.5 lb Date of Birth:  [birth year]       BSA:          1.763 m Patient Age:    43 years         BP:           105/69 mmHg Patient Gender: M                HR:           96 bpm. Exam Location:  Inpatient Procedure: Limited Echo, Cardiac Doppler and Limited Color  Doppler (Both            Spectral and Color Flow Doppler were utilized during procedure). Indications:    Pericardial Effusion  History:        Patient has prior history of Echocardiogram examinations, most                 recent 12/20/2024. Aortic Valve Disease and Mitral Valve                 Disease.                 Aortic Valve: 23 mm bioprosthetic valve is present in the aortic                 position. Procedure Date: 11/30/24.                 Mitral Valve: 27 mm bioprosthetic valve valve is present  in the                 mitral position. Procedure Date: 11/30/24.  Sonographer:    Koleen Popper RDCS Referring Phys: MANUELITA GARRE [facility] IMPRESSIONS  1. Left ventricular ejection fraction, by estimation, is 50 to 55%. The left ventricle has low normal function. The left ventricular internal cavity size was mildly dilated. There is moderate eccentric left ventricular hypertrophy. Left ventricular diastolic function could not be evaluated.  2. The mitral valve has been repaired/replaced. There is a 27 mm bioprosthetic valve present in the mitral position. Procedure Date: 11/30/24.  3. Large, primarily posterior pericardial effusion. Reduced from prior. Large pericardial effusion. There is no evidence of cardiac tamponade.  4. The aortic valve has been repaired/replaced. There is a 23 mm bioprosthetic valve present in the aortic position. Procedure Date: 11/30/24. Echo findings are consistent with normal structure and function of the aortic valve prosthesis. Aortic valve Vmax measures 2.72 m/s.  5. There is normal pulmonary artery systolic pressure.  6. The inferior vena cava is normal in size with greater than 50% respiratory variability, suggesting right atrial pressure of 3 mmHg. FINDINGS  Left Ventricle: Left ventricular ejection fraction, by estimation, is 50 to 55%. The left ventricle has low normal function. The left ventricular internal cavity size was mildly dilated. There is moderate eccentric left  ventricular hypertrophy. Abnormal  (paradoxical) septal motion consistent with post-operative status. Left ventricular diastolic function could not be evaluated. Left ventricular diastolic function could not be evaluated due to mitral valve replacement. Right Ventricle: There is normal pulmonary artery systolic pressure. The tricuspid regurgitant velocity is 2.64 m/s, and with an assumed right atrial pressure of 3 mmHg, the estimated right ventricular systolic pressure is 30.9 mmHg. Pericardium: Large, primarily posterior pericardial effusion. Reduced from prior. A large pericardial effusion is present. There is no evidence of cardiac tamponade. Mitral Valve: The mitral valve has been repaired/replaced. There is a 27 mm bioprosthetic valve present in the mitral position. Procedure Date: 11/30/24. MV peak gradient, 17.1 mmHg. The mean mitral valve gradient is 9.0 mmHg. Tricuspid Valve: The tricuspid valve is normal in structure. Tricuspid valve regurgitation is mild. Aortic Valve: The aortic valve has been repaired/replaced. Aortic valve mean gradient measures 15.0 mmHg. Aortic valve peak gradient measures 29.5 mmHg. Aortic valve area, by VTI measures 1.70 cm. There is a 23 mm bioprosthetic valve present in the aortic position. Procedure Date: 11/30/24. Echo findings are consistent with normal structure and function of the aortic valve prosthesis. Venous: The inferior vena cava is normal in size with greater than 50% respiratory variability, suggesting right atrial pressure of 3 mmHg. Additional Comments: Spectral Doppler performed. Color Doppler performed.  LEFT VENTRICLE PLAX 2D LVIDd:         5.50 cm      Diastology LVIDs:         4.30 cm      LV e' medial:    7.05 cm/s LV PW:         1.20 cm      LV E/e' medial:  19.9 LV IVS:        1.50 cm      LV e' lateral:   6.30 cm/s LVOT diam:     1.90 cm      LV E/e' lateral: 22.2 LV SV:         64 LV SV Index:   36 LVOT Area:     2.84 cm  LV Volumes (MOD) LV  vol d, MOD  A2C: 145.0 ml LV vol s, MOD A2C: 74.7 ml LV SV MOD A2C:     70.3 ml RIGHT VENTRICLE             IVC RV S prime:     10.20 cm/s  IVC diam: 1.60 cm LEFT ATRIUM         Index LA diam:    3.90 cm 2.21 cm/m  AORTIC VALVE AV Area (Vmax):    1.79 cm AV Area (Vmean):   1.78 cm AV Area (VTI):     1.70 cm AV Vmax:           271.50 cm/s AV Vmean:          176.500 cm/s AV VTI:            0.380 m AV Peak Grad:      29.5 mmHg AV Mean Grad:      15.0 mmHg LVOT Vmax:         171.00 cm/s LVOT Vmean:        111.000 cm/s LVOT VTI:          0.227 m LVOT/AV VTI ratio: 0.60  AORTA Ao Root diam: 3.70 cm MITRAL VALVE                TRICUSPID VALVE MV Area (PHT): 3.72 cm     TR Peak grad:   27.9 mmHg MV Area VTI:   1.46 cm     TR Vmax:        264.00 cm/s MV Peak grad:  17.1 mmHg MV Mean grad:  9.0 mmHg     SHUNTS MV Vmax:       2.07 m/s     Systemic VTI:  0.23 m MV Vmean:      143.0 cm/s   Systemic Diam: 1.90 cm MV Decel Time: 204 msec MV E velocity: 140.00 cm/s MV A velocity: 180.00 cm/s MV E/A ratio:  0.78 Morene Brownie Electronically signed by Morene Brownie Signature Date/Time: 12/25/2024/11:52:06 AM    Final     Anti-infectives: Anti-infectives (From admission, onward)    Start     Dose/Rate Route Frequency Ordered Stop   12/23/24 1100  vancomycin  (VANCOREADY) IVPB 1250 mg/250 mL  Status:  Discontinued        1,250 mg 166.7 mL/hr over 90 Minutes Intravenous Every 12 hours 12/23/24 0928 12/24/24 1257   12/23/24 0900  cefTRIAXone  (ROCEPHIN ) 2 g in sodium chloride  0.9 % 100 mL IVPB        2 g 200 mL/hr over 30 Minutes Intravenous Every 24 hours 12/23/24 0806 12/29/24 2359   12/23/24 0830  piperacillin -tazobactam (ZOSYN ) IVPB 3.375 g  Status:  Discontinued        3.375 g 12.5 mL/hr over 240 Minutes Intravenous Every 8 hours 12/23/24 0742 12/23/24 0806   12/15/24 1400  ceFEPIme  (MAXIPIME ) 2 g in sodium chloride  0.9 % 100 mL IVPB        2 g 200 mL/hr over 30 Minutes Intravenous Every 8 hours 12/15/24 1256 12/18/24  2258   12/14/24 1830  micafungin  (MYCAMINE ) 200 mg in sodium chloride  0.9 % 100 mL IVPB  Status:  Discontinued        200 mg 110 mL/hr over 1 Hours Intravenous  Once 12/14/24 1738 12/14/24 1743   12/14/24 1830  micafungin  (MYCAMINE ) 100 mg in sodium chloride  0.9 % 100 mL IVPB        100 mg 105 mL/hr over 1 Hours Intravenous  Every 24 hours 12/14/24 1738 12/20/24 1922   12/13/24 2000  vancomycin  (VANCOREADY) IVPB 1500 mg/300 mL  Status:  Discontinued        1,500 mg 150 mL/hr over 120 Minutes Intravenous Every 24 hours 12/13/24 1237 12/14/24 1418   12/13/24 0720  vancomycin  variable dose per unstable renal function (pharmacist dosing)  Status:  Discontinued         Does not apply See admin instructions 12/13/24 0720 12/13/24 1237   12/12/24 1630  meropenem  (MERREM ) 1 g in sodium chloride  0.9 % 100 mL IVPB  Status:  Discontinued        1 g 200 mL/hr over 30 Minutes Intravenous Every 8 hours 12/12/24 1534 12/15/24 1256   12/10/24 1000  micafungin  (MYCAMINE ) 100 mg in sodium chloride  0.9 % 100 mL IVPB  Status:  Discontinued       Placed in Followed by Linked Group   100 mg 105 mL/hr over 1 Hours Intravenous Every 24 hours 12/09/24 0704 12/11/24 0710   12/09/24 0830  micafungin  (MYCAMINE ) 200 mg in sodium chloride  0.9 % 100 mL IVPB       Placed in Followed by Linked Group   200 mg 110 mL/hr over 1 Hours Intravenous  Once 12/09/24 0704 12/09/24 1101   12/06/24 2200  vancomycin  (VANCOREADY) IVPB 1250 mg/250 mL  Status:  Discontinued        1,250 mg 166.7 mL/hr over 90 Minutes Intravenous Every 12 hours 12/06/24 0811 12/13/24 0709   12/06/24 0900  vancomycin  (VANCOREADY) IVPB 1750 mg/350 mL        1,750 mg 175 mL/hr over 120 Minutes Intravenous  Once 12/06/24 0802 12/06/24 1032   12/04/24 1100  vancomycin  (VANCOCIN ) IVPB 1000 mg/200 mL premix  Status:  Discontinued        1,000 mg 200 mL/hr over 60 Minutes Intravenous Every 12 hours 12/03/24 2238 12/03/24 2242   12/04/24 0000  vancomycin   (VANCOREADY) IVPB 1500 mg/300 mL  Status:  Discontinued        1,500 mg 150 mL/hr over 120 Minutes Intravenous  Once 12/03/24 2236 12/03/24 2242   12/03/24 2300  piperacillin -tazobactam (ZOSYN ) IVPB 3.375 g  Status:  Discontinued        3.375 g 12.5 mL/hr over 240 Minutes Intravenous Every 8 hours 12/03/24 2236 12/12/24 1534   11/30/24 2130  vancomycin  (VANCOCIN ) IVPB 1000 mg/200 mL premix        1,000 mg 200 mL/hr over 60 Minutes Intravenous  Once 11/30/24 1609 11/30/24 2351   11/30/24 1615  ceFAZolin  (ANCEF ) IVPB 2g/100 mL premix        2 g 200 mL/hr over 30 Minutes Intravenous Every 8 hours 11/30/24 1609 12/02/24 0546   11/30/24 0400  vancomycin  (VANCOREADY) IVPB 1250 mg/250 mL        1,250 mg 166.7 mL/hr over 90 Minutes Intravenous To Surgery 11/29/24 1040 11/30/24 0856   11/30/24 0400  ceFAZolin  (ANCEF ) IVPB 2g/100 mL premix        2 g 200 mL/hr over 30 Minutes Intravenous To Surgery 11/29/24 1040 11/30/24 1239   11/30/24 0400  ceFAZolin  (ANCEF ) IVPB 2g/100 mL premix  Status:  Discontinued        2 g 200 mL/hr over 30 Minutes Intravenous To Surgery 11/29/24 1036 11/30/24 1609       Assessment/Plan: 44 y/o M S/P AVR, MCR, Impella placement by Dr. Lucas 12/8  S/P right thoracentesis with hemorrhage from intercostal -chest tube placed by TCTS.  S/p emergent IR  embolizaton  R intercostal artery by Dr. Philip.    S/P VATS 12/18    Pneumatosis of the pelvic small bowel and R colon with portal venous gas on CT  POD 21/19/17 status post ex lap with SBR & VAC placement, repeat laparotomy with patchy small bowel ischemia, end ileostomy/ab closure - WBC 11 and stable - ostomy output 1.2L.  On banatrol TID, imodium  from 4 mg q 6h, lomotil  2 mg QID. CCM added relizorb yesterday. Continue current regimen.  - woc following  - Hb stable despite clots from rectum. Ostomy output appears normal. Will defer to primary team regarding anticoagulation.  - Patient is interested in stopping the tube  feeds, but he does not appear to be taking many calories by mouth. Discussed the importance of calories/protein and recommended 2-3 supplements per day. Will add calorie counts and nutrition labs   FEN: Regular, TF as above ID: cefepime , micafungin  completed; resp cx 12/18 with few C albican, BCx 12/17 neg, BAL 12/23 rare yeast; given WBC and some resp bacteria on gram stain, CCM starting 7 d abx (Rocephin ) on 12/30 VTE: coumadin   Foley: removed, spont voids Dispo: ICU, possible CIR soon, stable for D/C to CIR from CCS standpoint, we can follow ostomy and nutrition while there.  Cordella DELENA Idler 12/27/2024

## 2024-12-27 NOTE — Progress Notes (Signed)
 "  NAME:  Alejandro Lopez, MRN:  [id number], DOB:  11/30/1981, LOS: 34 ADMISSION DATE:  11/22/2024, CONSULTATION DATE:  12/8 REFERRING MD:  Lucas, CHIEF COMPLAINT:  post cardiac surgery critical care   History of Present Illness:  44 year old male with history of hypertension, alcohol and tobacco abuse, presented to the emergency room on 11/30 with approximately 1 month history of progressive dyspnea accompanied by lower extremity swelling extending up to the level of his scrotum and abdomen. Diagnostic evaluation by echocardiogram showed left ventricular ejection fraction 35 to 40% with grade 3 diastolic dysfunction this was further complicated by severe mitral valve regurgitation and aortic insufficiency because of this he was transferred to [facility] for further evaluation. He underwent cardiac catheterization on 12/4: Right heart hemodynamic parameters showed baseline right atrial pressure 5 mmHg, PA pressure 32/17, pulmonary capillary wedge pressure at 14 estimated Fick 4.9 L/min with cardiac index Fick calculated at 2.59 His PAPi was 3 Left heart cath was negative for coronary artery disease Went to OR 12/8 for MVR and AVR w/ impella insertion. PCCM asked to assist w/ post op care.   OR course EBL: 1735 Received  Products: cryo 92ml, FFP 401 Also received DDAVP , 2200 crystalloid, 250ml albumin   Cell saver 1125 Pump time 4hrs Clamp time 2hrs 50 min  Events: multiple defibrillations intra-op  Intra-op ECHO EF estimated 40%  Pertinent  Medical History  Tobacco abuse, alcohol abuse, hypertension  Significant Hospital Events: Including procedures, antibiotic start and stop dates in addition to other pertinent events   11/30 admitted/. ECHO 35 to 40% with grade 3 diastolic dysfunction this was further complicated by severe mitral valve regurgitation and aortic insufficiency 12/4 left and right heart cath 12/8 AVR and MVR w/ bioprosthetic valves. Arrived on icu w/ Impella 5.5 MCS  at flow 4 lpm and P 7. Received 2 more PLTs, 2 FFP and 1 cryo in first 6 hrs post op for cont blood loss/oozing. Required NE, epi and milrinone . Good flow on IMPELLA but minimal pulsatility  at P7. Placed on Amio gtt for VT 12/9 received 1 unit PRBC over night for hgb down to 7.5, hgb 8 getting second unit of blood. Still on P7 3.5 lPM. Extubated. 12/11 1L thora, later hemorrhagic shock and hemothorax> IR embolization. Also found to have ischemic bowel and underwent ex-lap with partial resection of ileum. 12/15 plan for ostomy tomorrow, transition epi to levophed   12/16 plan back to OR for ostomy placement  12/17 hemodynamically stable off pressors on Milrinone  0.25 , unable to tolerate SBT, plan for CT chest and heparin  gtt, high fevers>micafungin  12/18 VATS for decortication R chest 12/19 extubated 12/20-12/21 abx to meropenem , aspiration/ agitation/ weakness leading to CO2 retention, RV dysfunction and reintubation, L CVC removed; R CVC placed 12/22: Failed SBT d/t wob 12/23: Bedside tracheostomy  12/24 Impella 5 5 was removed in OR, spiked fever with Tmax 101.1, Precedex  stopped, remained off pressors on dobutamine  at 2.5 12/25 tolerated 1 hr weaning on vent, off DBA 12/26 foley & chest tubes out 1/3 and 1/4 -some bleeding from ostomy output therefore heparin  is held.  Interim History / Subjective:  Patient apparently had 1 bowel movement overnight with some blood.  Hemoglobin is stable at 8.2.  Heparin  is continued to be held.  Ostomy bag is empty and no bleed noted to me.  Objective    Blood pressure 91/68, pulse (!) 106, temperature 99 F (37.2 C), temperature source Oral, resp. rate (!) 24, height 5' 10 (  1.778 m), weight 65.1 kg, SpO2 99%.    FiO2 (%):  [21 %] 21 %   Intake/Output Summary (Last 24 hours) at 12/27/2024 1725 Last data filed at 12/27/2024 1714 Gross per 24 hour  Intake 2201.25 ml  Output 3745 ml  Net -1543.75 ml   Filed Weights   12/25/24 0600 12/26/24 0619  12/27/24 0500  Weight: 61 kg 65.5 kg 65.1 kg     Physical exam: General: Chronically ill-appearing male, lying on the bed HEENT: [place]/AT, eyes anicteric.  moist mucus membranes Neuro: Alert, awake following commands Chest: Coarse breath sounds, no wheezes or rhonchi Heart: Regular rate and rhythm, no murmurs or gallops Abdomen: Soft, nontender, nondistended, bowel sounds present ostomy bag is empty no bleed noted.       Patient Lines/Drains/Airways Status     Active Line/Drains/Airways     Name Placement date Placement time Site Days   Peripheral IV 12/22/24 22 G 1.75 Anterior;Left;Lateral Forearm 12/22/24  0800  Forearm  5   Peripheral IV 12/24/24 22 G 1.75 Anterior;Right Forearm 12/24/24  1203  Forearm  3   Negative Pressure Wound Therapy Abdomen Medial 12/10/24  0730  --  17   Ileostomy Standard (end) RLQ 12/08/24  1352  RLQ  19   Small Bore Feeding Tube 10 Fr. Left nare Marking at nare/corner of mouth 63 cm 12/18/24  1105  Left nare  9   Tracheostomy Shiley Flexible 6 mm Cuffed 12/15/24  1143  6 mm  12   Wound 11/30/24 1512 Surgical Closed Surgical Incision Chest Other (Comment) 11/30/24  1512  Chest  27   Wound 11/30/24 1512 Surgical Closed Surgical Incision Chest Right 11/30/24  1512  Chest  27   Wound 12/05/24 1000 Pressure Injury Buttocks Mid Deep Tissue Pressure Injury - Purple or maroon localized area of discolored intact skin or blood-filled blister due to damage of underlying soft tissue from pressure and/or shear. 12/05/24  1000  Buttocks  22   Wound 12/05/24 1033  Back Left;Upper 12/05/24  1033  Back  22   Wound 12/08/24 1408 Surgical Closed Surgical Incision Abdomen Other (Comment) 12/08/24  1408  Abdomen  19            Resolved problem list  AKI Lactic acidosis, resolved Constipation Hyponatremia Right-sided hemothorax status post IR embolization 12/11, status post VATS 12/18 Acute septic encephalopathy, resolved Postop ileus Shock liver, mild  transaminase elevation from TPN Hypertriglyceridemia due to propofol  infusion, now TPN  Assessment and Plan  Acute biventricular HFrEF with cardiogenic shock status post Impella 5.5, removed on 12/24 Severe mitral regurgitation and aortic insufficiency status post bioprosthetic mitral valve and aortic valve replacement; valves functioning well Nonsustained VT, no more episodes Moderate posterior/ lateral pericardial effusion> not amenable to drainage -has been stable off inotropes and MCS -con't GDMT - dig, spiro, losartan  -No need for diuretics while ostomy output remains high; longer term risk for him is dehydration -warfarin for 3 months, heparin  bridge. Pharmacy to dose both.-Anticoagulation held because of blood in ostomy output. -con't amiodarone  200mg  BID -colchicine  -tele monitoring  Acute respiratory failure with hypoxia and hypercapnia status post tracheostomy -trach care per protocol Tolerating capping.  Given the GI bleed we will continue with the tracheostomy capped.  Decannulate when no acute issues going on. -complete 7 days of ceftriaxone ; last day 12/29/24  Sepsis with septic shock due to recurrent aspiration pneumonia -Enterobacter Leukocyosis; slowly uptrending. UA reassurring, CXR unchanged. Pericardial fluid collection does not seem remarkably different on  echo. 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 During the day decrease by 30% from goal and increase nightly TF by 30%.  -Encouraging him to eat and drink -con't fiber, banatrol, imodium , loperamide ; meeting goal for ostomy output ~1L -con't relizorb to help with lipid absorption- 1 cartridge during the day, 2 at night for higher TF flow rates. Anticipate he would benefit from Creon for short gut once off TF -FWF; need to titrate based on I/O -appreciate surgery's management -Anticipate  he will require vitamin B12 injections.  Left closely monitor fat-soluble vitamin levels.  Hypertriglyceridemia due to propofol  infusion, now TPN  H/o alcohol abuse -vitamins, recommend total ETOH avoidance  Anemia of critical illness and previous ABLA from hemothorax, operative blood loss -transfuse for Hb <7 or hemodynamically significant bleeding  Agitation> situational due to prolonged illness> better Debility -CIR consulted -PT, OT, SLP -OOB mobility; has had trouble with cramping  Hyponatremia -need to ensure drinking fluids with electrolytes due to high fluid needs    DVT: heparin  gtt , warfarin on hold because of GI bleed. GI: stopped-- would need to be restarted if he goes back on vent Lines:  PIVs   sodium chloride  20 mL/hr at 12/17/24 1412   cefTRIAXone  (ROCEPHIN )  IV Stopped (12/27/24 0951)   feeding supplement (VITAL 1.5 CAL) 45 mL/hr at 12/27/24 1700   feeding supplement (VITAL 1.5 CAL) Stopped (12/27/24 0545)     Tamela Stakes, MD  Attending Physician, Critical Care Medicine [facility] See Amion for pager If no response to pager, please call [phone] until 7pm After 7pm, Please call E-link [phone]  "

## 2024-12-27 NOTE — Progress Notes (Signed)
 11 Days Post-Op Procedures (LRB): REMOVAL, CARDIAC ASSIST DEVICE, IMPELLA (Right) Subjective: Feels better today  Objective: Vital signs in last 24 hours: Temp:  [98.1 F (36.7 C)-99.6 F (37.6 C)] 99 F (37.2 C) (01/04 0721) Pulse Rate:  [98-121] 107 (01/04 0600) Cardiac Rhythm: Sinus tachycardia (01/04 0400) Resp:  [19-43] 30 (01/04 0600) BP: (82-141)/(54-88) 109/62 (01/04 0600) SpO2:  [92 %-98 %] 94 % (01/04 0600) FiO2 (%):  [21 %] 21 % (01/04 0752) Weight:  [65.1 kg] 65.1 kg (01/04 0500)  Hemodynamic parameters for last 24 hours:    Intake/Output from previous day: 01/03 0701 - 01/04 0700 In: 2619.6 [I.V.:38.4; NG/GT:2381.3; IV Piggyback:200] Out: 2425 [Urine:1300; Stool:1125] Intake/Output this shift: Total I/O In: 100 [NG/GT:100] Out: -   General appearance: alert, cooperative, and no distress Neurologic: intact Heart: regular, mildly tachy Abdomen: ostomy Ok Wound: VAC in place  Lab Results: Recent Labs    12/26/24 0046 12/26/24 1158 12/27/24 0126  WBC 10.8*  --  11.2*  HGB 8.3* 8.2* 7.9*  HCT 25.4* 24.8* 24.1*  PLT 487*  --  428*   BMET:  Recent Labs    12/26/24 0046 12/27/24 0126  NA 130* 129*  K 5.0 4.9  CL 99 98  CO2 22 21*  GLUCOSE 103* 127*  BUN 15 17  CREATININE 0.50* 0.55*  CALCIUM  8.6* 8.8*    PT/INR:  Recent Labs    12/27/24 0126  LABPROT 14.5  INR 1.1   ABG    Component Value Date/Time   PHART 7.404 12/25/2024 0555   HCO3 21.1 12/25/2024 0555   TCO2 22 12/25/2024 0555   ACIDBASEDEF 3.0 (H) 12/25/2024 0555   O2SAT 96 12/25/2024 0555   CBG (last 3)  No results for input(s): GLUCAP in the last 72 hours.  Assessment/Plan: S/P Procedures (LRB): REMOVAL, CARDIAC ASSIST DEVICE, IMPELLA (Right) S/P Procedures (LRB): REMOVAL, CARDIAC ASSIST DEVICE, IMPELLA (Right) S/P AVR/MVR with pericardial valves and Insertion of Impella 5.5 for postop support 11/30/24. S/P right thoracentesis 12/03/24 complicated by massive right  hemothorax due to intercostal artery laceration embolized by IR.  S/P Ex lap, 75 cm ileal resection and wound vac by GS for ischemic distal ileum 12/04/24. S/P re-exploration abdomen and right colectomy without anastomosis for ischemia 12/06/24. S/P re-exploration abdomen, limited distal small bowel resection and end ileostomy 12/08/24. S/P right VATS for decortication and drainage of retained hemothorax 12/10/24. S/P Percutaneous trach by CCM on 12/15/24 S/P removal of Impella 5.5 on 12/16/24.   NEURO - intact CV- s/p AVR/ MVR- pericardial valves  Mildly tachycardic  Heparin  drip stopped yesterday for bloody stool  Warfarin- INR 1.1 RESP- VDRF, trach  Trach capped - tolerating well  Possible dc trach today or tomorrow ID- afebrile on ceftriaxone  day 5 GI- bloody stool yesterday and again last night  Heparin  on hold  Abdomen benign  Hgb stable 7.9 this AM RENAL- creatinine Ok  Mild hyponatremia Deconditioning- PT/OT     LOS: 34 days    Alejandro Lopez 12/27/2024

## 2024-12-28 ENCOUNTER — Encounter (HOSPITAL_COMMUNITY): Payer: Self-pay | Admitting: Physical Medicine & Rehabilitation

## 2024-12-28 ENCOUNTER — Inpatient Hospital Stay (HOSPITAL_COMMUNITY)
Admission: AD | Admit: 2024-12-28 | Discharge: 2025-01-05 | DRG: 945 | Disposition: A | Source: Intra-hospital | Attending: Physical Medicine & Rehabilitation | Admitting: Physical Medicine & Rehabilitation

## 2024-12-28 ENCOUNTER — Telehealth (HOSPITAL_COMMUNITY): Payer: Self-pay

## 2024-12-28 ENCOUNTER — Other Ambulatory Visit: Payer: Self-pay

## 2024-12-28 DIAGNOSIS — F17210 Nicotine dependence, cigarettes, uncomplicated: Secondary | ICD-10-CM | POA: Diagnosis present

## 2024-12-28 DIAGNOSIS — R7989 Other specified abnormal findings of blood chemistry: Secondary | ICD-10-CM | POA: Diagnosis present

## 2024-12-28 DIAGNOSIS — I5021 Acute systolic (congestive) heart failure: Secondary | ICD-10-CM | POA: Diagnosis present

## 2024-12-28 DIAGNOSIS — I340 Nonrheumatic mitral (valve) insufficiency: Secondary | ICD-10-CM | POA: Diagnosis present

## 2024-12-28 DIAGNOSIS — E875 Hyperkalemia: Secondary | ICD-10-CM | POA: Diagnosis not present

## 2024-12-28 DIAGNOSIS — I3139 Other pericardial effusion (noninflammatory): Secondary | ICD-10-CM | POA: Diagnosis present

## 2024-12-28 DIAGNOSIS — J45909 Unspecified asthma, uncomplicated: Secondary | ICD-10-CM | POA: Diagnosis present

## 2024-12-28 DIAGNOSIS — M7918 Myalgia, other site: Secondary | ICD-10-CM | POA: Diagnosis not present

## 2024-12-28 DIAGNOSIS — Z7901 Long term (current) use of anticoagulants: Secondary | ICD-10-CM | POA: Diagnosis not present

## 2024-12-28 DIAGNOSIS — Z79899 Other long term (current) drug therapy: Secondary | ICD-10-CM

## 2024-12-28 DIAGNOSIS — R5381 Other malaise: Secondary | ICD-10-CM

## 2024-12-28 DIAGNOSIS — E869 Volume depletion, unspecified: Secondary | ICD-10-CM | POA: Diagnosis not present

## 2024-12-28 DIAGNOSIS — I428 Other cardiomyopathies: Secondary | ICD-10-CM | POA: Diagnosis present

## 2024-12-28 DIAGNOSIS — Z720 Tobacco use: Secondary | ICD-10-CM | POA: Diagnosis present

## 2024-12-28 DIAGNOSIS — Z833 Family history of diabetes mellitus: Secondary | ICD-10-CM | POA: Diagnosis not present

## 2024-12-28 DIAGNOSIS — E119 Type 2 diabetes mellitus without complications: Secondary | ICD-10-CM | POA: Diagnosis present

## 2024-12-28 DIAGNOSIS — Z7984 Long term (current) use of oral hypoglycemic drugs: Secondary | ICD-10-CM | POA: Diagnosis not present

## 2024-12-28 DIAGNOSIS — E43 Unspecified severe protein-calorie malnutrition: Secondary | ICD-10-CM | POA: Diagnosis present

## 2024-12-28 DIAGNOSIS — I350 Nonrheumatic aortic (valve) stenosis: Secondary | ICD-10-CM

## 2024-12-28 DIAGNOSIS — I110 Hypertensive heart disease with heart failure: Secondary | ICD-10-CM | POA: Diagnosis present

## 2024-12-28 DIAGNOSIS — Z8249 Family history of ischemic heart disease and other diseases of the circulatory system: Secondary | ICD-10-CM

## 2024-12-28 DIAGNOSIS — I472 Ventricular tachycardia, unspecified: Secondary | ICD-10-CM

## 2024-12-28 DIAGNOSIS — Z932 Ileostomy status: Secondary | ICD-10-CM

## 2024-12-28 DIAGNOSIS — I5022 Chronic systolic (congestive) heart failure: Secondary | ICD-10-CM | POA: Diagnosis present

## 2024-12-28 DIAGNOSIS — J9601 Acute respiratory failure with hypoxia: Secondary | ICD-10-CM | POA: Diagnosis present

## 2024-12-28 DIAGNOSIS — M542 Cervicalgia: Secondary | ICD-10-CM | POA: Diagnosis present

## 2024-12-28 DIAGNOSIS — I5020 Unspecified systolic (congestive) heart failure: Secondary | ICD-10-CM | POA: Diagnosis present

## 2024-12-28 DIAGNOSIS — Z681 Body mass index (BMI) 19 or less, adult: Secondary | ICD-10-CM | POA: Diagnosis not present

## 2024-12-28 DIAGNOSIS — I509 Heart failure, unspecified: Secondary | ICD-10-CM

## 2024-12-28 DIAGNOSIS — E871 Hypo-osmolality and hyponatremia: Secondary | ICD-10-CM | POA: Diagnosis not present

## 2024-12-28 DIAGNOSIS — I341 Nonrheumatic mitral (valve) prolapse: Secondary | ICD-10-CM

## 2024-12-28 DIAGNOSIS — R451 Restlessness and agitation: Secondary | ICD-10-CM | POA: Diagnosis not present

## 2024-12-28 DIAGNOSIS — I4892 Unspecified atrial flutter: Secondary | ICD-10-CM | POA: Diagnosis not present

## 2024-12-28 DIAGNOSIS — Z952 Presence of prosthetic heart valve: Secondary | ICD-10-CM

## 2024-12-28 DIAGNOSIS — I351 Nonrheumatic aortic (valve) insufficiency: Secondary | ICD-10-CM | POA: Diagnosis present

## 2024-12-28 DIAGNOSIS — Z933 Colostomy status: Secondary | ICD-10-CM | POA: Diagnosis not present

## 2024-12-28 DIAGNOSIS — D62 Acute posthemorrhagic anemia: Secondary | ICD-10-CM | POA: Diagnosis present

## 2024-12-28 DIAGNOSIS — Z823 Family history of stroke: Secondary | ICD-10-CM

## 2024-12-28 DIAGNOSIS — Z953 Presence of xenogenic heart valve: Secondary | ICD-10-CM | POA: Diagnosis not present

## 2024-12-28 LAB — BASIC METABOLIC PANEL WITH GFR
Anion gap: 12 (ref 5–15)
BUN: 17 mg/dL (ref 6–20)
CO2: 20 mmol/L — ABNORMAL LOW (ref 22–32)
Calcium: 9 mg/dL (ref 8.9–10.3)
Chloride: 97 mmol/L — ABNORMAL LOW (ref 98–111)
Creatinine, Ser: 0.58 mg/dL — ABNORMAL LOW (ref 0.61–1.24)
GFR, Estimated: >60 mL/min
Glucose, Bld: 141 mg/dL — ABNORMAL HIGH (ref 70–99)
Potassium: 5.3 mmol/L — ABNORMAL HIGH (ref 3.5–5.1)
Sodium: 129 mmol/L — ABNORMAL LOW (ref 135–145)

## 2024-12-28 LAB — CBC
HCT: 25.4 % — ABNORMAL LOW (ref 39.0–52.0)
Hemoglobin: 8.3 g/dL — ABNORMAL LOW (ref 13.0–17.0)
MCH: 29.1 pg (ref 26.0–34.0)
MCHC: 32.7 g/dL (ref 30.0–36.0)
MCV: 89.1 fL (ref 80.0–100.0)
Platelets: 424 K/uL — ABNORMAL HIGH (ref 150–400)
RBC: 2.85 MIL/uL — ABNORMAL LOW (ref 4.22–5.81)
RDW: 16.8 % — ABNORMAL HIGH (ref 11.5–15.5)
WBC: 11.8 K/uL — ABNORMAL HIGH (ref 4.0–10.5)
nRBC: 0.3 % — ABNORMAL HIGH (ref 0.0–0.2)

## 2024-12-28 LAB — PROTIME-INR
INR: 1.1 (ref 0.8–1.2)
Prothrombin Time: 14.5 s (ref 11.4–15.2)

## 2024-12-28 LAB — MAGNESIUM: Magnesium: 1.5 mg/dL — ABNORMAL LOW (ref 1.7–2.4)

## 2024-12-28 MED ORDER — DIGOXIN 62.5 MCG PO TABS: 0.0625 mg | ORAL_TABLET | Freq: Every day | ORAL | Status: DC

## 2024-12-28 MED ORDER — ALUM & MAG HYDROXIDE-SIMETH 200-200-20 MG/5ML PO SUSP: 30.0000 mL | ORAL | Status: DC | PRN

## 2024-12-28 MED ORDER — MAGNESIUM SULFATE 4 GM/100ML IV SOLN
4.0000 g | Freq: Once | INTRAVENOUS | Status: AC
  Administered 2024-12-28: 4 g via INTRAVENOUS
  Filled 2024-12-28: qty 100

## 2024-12-28 MED ORDER — LOPERAMIDE HCL 1 MG/7.5ML PO SUSP: 4.0000 mg | Freq: Four times a day (QID) | ORAL | Status: DC

## 2024-12-28 MED ORDER — FREE WATER: 100.0000 mL | Status: DC

## 2024-12-28 MED ORDER — QUETIAPINE FUMARATE 25 MG PO TABS: 25.0000 mg | ORAL_TABLET | Freq: Every day | ORAL | Status: DC

## 2024-12-28 MED ORDER — QUETIAPINE FUMARATE 25 MG PO TABS
25.0000 mg | ORAL_TABLET | Freq: Every day | ORAL | Status: DC
  Administered 2024-12-28 – 2024-12-29 (×2): 25 mg
  Filled 2024-12-28 (×2): qty 1

## 2024-12-28 MED ORDER — ARFORMOTEROL TARTRATE 15 MCG/2ML IN NEBU
15.0000 ug | INHALATION_SOLUTION | Freq: Two times a day (BID) | RESPIRATORY_TRACT | Status: DC
  Administered 2024-12-28 – 2025-01-04 (×12): 15 ug via RESPIRATORY_TRACT
  Filled 2024-12-28 (×14): qty 2

## 2024-12-28 MED ORDER — CHLORHEXIDINE GLUCONATE CLOTH 2 % EX PADS: 6.0000 | MEDICATED_PAD | Freq: Every day | CUTANEOUS | Status: DC

## 2024-12-28 MED ORDER — CYANOCOBALAMIN 1000 MCG/ML IJ SOLN: 1000.0000 ug | INTRAMUSCULAR | Status: DC

## 2024-12-28 MED ORDER — ADULT MULTIVITAMIN W/MINERALS CH: 1.0000 | ORAL_TABLET | Freq: Every day | ORAL | Status: DC

## 2024-12-28 MED ORDER — SCOPOLAMINE 1 MG/3DAYS TD PT72
1.0000 | MEDICATED_PATCH | TRANSDERMAL | Status: DC
  Administered 2024-12-29 – 2025-01-04 (×3): 1 mg via TRANSDERMAL
  Filled 2024-12-28 (×4): qty 1

## 2024-12-28 MED ORDER — ORAL CARE MOUTH RINSE
15.0000 mL | OROMUCOSAL | Status: DC
  Administered 2024-12-28: 15 mL via OROMUCOSAL

## 2024-12-28 MED ORDER — ORAL CARE MOUTH RINSE
15.0000 mL | OROMUCOSAL | Status: DC
  Administered 2024-12-28 – 2025-01-05 (×28): 15 mL via OROMUCOSAL

## 2024-12-28 MED ORDER — VITAL 1.5 CAL PO LIQD
1000.0000 mL | ORAL | Status: DC
  Administered 2024-12-28 – 2024-12-29 (×2): 1000 mL
  Filled 2024-12-28 (×2): qty 1000

## 2024-12-28 MED ORDER — COLCHICINE 0.6 MG PO TABS: 0.3000 mg | ORAL_TABLET | Freq: Every day | ORAL | Status: DC

## 2024-12-28 MED ORDER — DIPHENOXYLATE-ATROPINE 2.5-0.025 MG PO TABS
1.0000 | ORAL_TABLET | Freq: Four times a day (QID) | ORAL | Status: DC
  Administered 2024-12-28 – 2024-12-30 (×7): 1
  Filled 2024-12-28 (×7): qty 1

## 2024-12-28 MED ORDER — PROSOURCE TF20 ENFIT COMPATIBL EN LIQD: 60.0000 mL | Freq: Two times a day (BID) | ENTERAL | Status: DC

## 2024-12-28 MED ORDER — LOSARTAN POTASSIUM 25 MG PO TABS
25.0000 mg | ORAL_TABLET | Freq: Every day | ORAL | Status: DC
  Administered 2024-12-29: 25 mg
  Filled 2024-12-28: qty 1

## 2024-12-28 MED ORDER — FREE WATER
100.0000 mL | Status: DC
  Administered 2024-12-28 – 2024-12-29 (×4): 100 mL

## 2024-12-28 MED ORDER — WARFARIN SODIUM 5 MG PO TABS
10.0000 mg | ORAL_TABLET | Freq: Once | ORAL | Status: DC
  Administered 2024-12-28: 10 mg via ORAL
  Filled 2024-12-28: qty 2

## 2024-12-28 MED ORDER — ALBUTEROL SULFATE (2.5 MG/3ML) 0.083% IN NEBU: 2.5000 mg | INHALATION_SOLUTION | RESPIRATORY_TRACT | Status: DC | PRN

## 2024-12-28 MED ORDER — POLYETHYLENE GLYCOL 3350 17 G PO PACK: 17.0000 g | PACK | Freq: Every day | ORAL | Status: DC | PRN

## 2024-12-28 MED ORDER — ONDANSETRON HCL 4 MG/2ML IJ SOLN: 4.0000 mg | Freq: Four times a day (QID) | INTRAMUSCULAR | Status: DC | PRN

## 2024-12-28 MED ORDER — AMIODARONE HCL 200 MG PO TABS
200.0000 mg | ORAL_TABLET | Freq: Two times a day (BID) | ORAL | Status: DC
  Administered 2024-12-28 – 2024-12-30 (×4): 200 mg
  Filled 2024-12-28 (×4): qty 1

## 2024-12-28 MED ORDER — ARFORMOTEROL TARTRATE 15 MCG/2ML IN NEBU: 15.0000 ug | INHALATION_SOLUTION | Freq: Two times a day (BID) | RESPIRATORY_TRACT | Status: DC

## 2024-12-28 MED ORDER — COLCHICINE 0.3 MG HALF TABLET
0.3000 mg | ORAL_TABLET | Freq: Every day | ORAL | Status: DC
  Administered 2024-12-29: 0.3 mg
  Filled 2024-12-28 (×2): qty 1

## 2024-12-28 MED ORDER — BANATROL TF EN LIQD
60.0000 mL | Freq: Four times a day (QID) | ENTERAL | Status: DC
  Administered 2024-12-28 – 2024-12-30 (×7): 60 mL
  Filled 2024-12-28 (×7): qty 60

## 2024-12-28 MED ORDER — CEFTRIAXONE SODIUM 1 G IJ SOLR: INTRAMUSCULAR | Status: DC

## 2024-12-28 MED ORDER — ORAL CARE MOUTH RINSE: 15.0000 mL | OROMUCOSAL | Status: DC | PRN

## 2024-12-28 MED ORDER — FOLIC ACID 1 MG PO TABS: 1.0000 mg | ORAL_TABLET | Freq: Every day | ORAL | Status: DC

## 2024-12-28 MED ORDER — DIPHENOXYLATE-ATROPINE 2.5-0.025 MG PO TABS: 1.0000 | ORAL_TABLET | Freq: Four times a day (QID) | ORAL | Status: DC

## 2024-12-28 MED ORDER — OXYCODONE HCL 5 MG PO TABS: 5.0000 mg | ORAL_TABLET | ORAL | Status: DC | PRN

## 2024-12-28 MED ORDER — VITAMIN B-1 100 MG PO TABS: 100.0000 mg | ORAL_TABLET | Freq: Every day | ORAL | Status: DC

## 2024-12-28 MED ORDER — WARFARIN - PHARMACIST DOSING INPATIENT: Freq: Every day | Status: DC

## 2024-12-28 MED ORDER — SCOPOLAMINE 1 MG/3DAYS TD PT72: 1.0000 | MEDICATED_PATCH | TRANSDERMAL | Status: DC

## 2024-12-28 MED ORDER — PANTOPRAZOLE SODIUM 40 MG IV SOLR
40.0000 mg | Freq: Every day | INTRAVENOUS | Status: DC
  Administered 2024-12-29 – 2024-12-30 (×2): 40 mg via INTRAVENOUS
  Filled 2024-12-28 (×2): qty 10

## 2024-12-28 MED ORDER — ADULT MULTIVITAMIN W/MINERALS CH
1.0000 | ORAL_TABLET | Freq: Every day | ORAL | Status: DC
  Administered 2024-12-29: 1
  Filled 2024-12-28: qty 1

## 2024-12-28 MED ORDER — THIAMINE MONONITRATE 100 MG PO TABS
100.0000 mg | ORAL_TABLET | Freq: Every day | ORAL | Status: DC
  Administered 2024-12-28 – 2024-12-29 (×2): 100 mg
  Filled 2024-12-28 (×2): qty 1

## 2024-12-28 MED ORDER — ACETAMINOPHEN 160 MG/5ML PO SOLN: 650.0000 mg | Freq: Four times a day (QID) | ORAL | Status: DC | PRN

## 2024-12-28 MED ORDER — SODIUM CHLORIDE 0.9 % IV SOLN
2.0000 g | INTRAVENOUS | Status: AC
  Administered 2024-12-29: 2 g via INTRAVENOUS
  Filled 2024-12-28: qty 20

## 2024-12-28 MED ORDER — REVEFENACIN 175 MCG/3ML IN SOLN: 175.0000 ug | Freq: Every day | RESPIRATORY_TRACT | Status: DC

## 2024-12-28 MED ORDER — LIDOCAINE 5 % EX PTCH
1.0000 | MEDICATED_PATCH | CUTANEOUS | Status: DC
  Administered 2024-12-29 – 2025-01-04 (×7): 1 via TRANSDERMAL
  Filled 2024-12-28 (×8): qty 1

## 2024-12-28 MED ORDER — RELIZORB DEVI: 1.0000 | Freq: Every day | Status: DC

## 2024-12-28 MED ORDER — EMPAGLIFLOZIN 10 MG PO TABS
10.0000 mg | ORAL_TABLET | Freq: Every day | ORAL | Status: DC
  Administered 2024-12-28: 10 mg via ORAL
  Filled 2024-12-28: qty 1

## 2024-12-28 MED ORDER — LOPERAMIDE HCL 1 MG/7.5ML PO SUSP
4.0000 mg | Freq: Four times a day (QID) | ORAL | Status: DC
  Administered 2024-12-28 – 2024-12-30 (×7): 4 mg
  Filled 2024-12-28 (×11): qty 30

## 2024-12-28 MED ORDER — ORAL CARE MOUTH RINSE: 15.0000 mL | Freq: Four times a day (QID) | OROMUCOSAL | Status: DC

## 2024-12-28 MED ORDER — REVEFENACIN 175 MCG/3ML IN SOLN
175.0000 ug | Freq: Every day | RESPIRATORY_TRACT | Status: DC
  Administered 2024-12-29 – 2025-01-04 (×6): 175 ug via RESPIRATORY_TRACT
  Filled 2024-12-28 (×9): qty 3

## 2024-12-28 MED ORDER — SIMETHICONE 40 MG/0.6ML PO SUSP: 40.0000 mg | Freq: Four times a day (QID) | ORAL | Status: DC | PRN

## 2024-12-28 MED ORDER — OXYCODONE HCL 5 MG PO TABS
5.0000 mg | ORAL_TABLET | ORAL | Status: DC | PRN
  Administered 2024-12-30: 10 mg
  Filled 2024-12-28: qty 2

## 2024-12-28 MED ORDER — AMIODARONE HCL 200 MG PO TABS: 200.0000 mg | ORAL_TABLET | Freq: Two times a day (BID) | ORAL | Status: DC

## 2024-12-28 MED ORDER — VITAL 1.5 CAL PO LIQD: 1000.0000 mL | ORAL | Status: DC

## 2024-12-28 MED ORDER — FLEET ENEMA RE ENEM: 1.0000 | ENEMA | Freq: Once | RECTAL | Status: DC | PRN

## 2024-12-28 MED ORDER — PANTOPRAZOLE SODIUM 40 MG IV SOLR: 40.0000 mg | Freq: Every day | INTRAVENOUS | Status: DC

## 2024-12-28 MED ORDER — CALCIUM POLYCARBOPHIL 625 MG PO TABS: 625.0000 mg | ORAL_TABLET | Freq: Every day | ORAL | Status: DC

## 2024-12-28 MED ORDER — GUAIFENESIN-DM 100-10 MG/5ML PO SYRP: 5.0000 mL | ORAL_SOLUTION | Freq: Four times a day (QID) | ORAL | Status: DC | PRN

## 2024-12-28 MED ORDER — DIGOXIN 0.0625 MG HALF TABLET
0.0625 mg | ORAL_TABLET | Freq: Every day | ORAL | Status: DC
  Administered 2024-12-29 – 2024-12-30 (×2): 0.0625 mg
  Filled 2024-12-28 (×2): qty 1

## 2024-12-28 MED ORDER — BISACODYL 10 MG RE SUPP: 10.0000 mg | Freq: Every day | RECTAL | Status: DC | PRN

## 2024-12-28 MED ORDER — RELIZORB DEVI: 2.0000 | Freq: Every day | Status: DC

## 2024-12-28 MED ORDER — LOSARTAN POTASSIUM 25 MG PO TABS: 25.0000 mg | ORAL_TABLET | Freq: Every day | ORAL | Status: DC

## 2024-12-28 MED ORDER — FOLIC ACID 1 MG PO TABS
1.0000 mg | ORAL_TABLET | Freq: Every day | ORAL | Status: DC
  Administered 2024-12-28 – 2025-01-04 (×8): 1 mg via ORAL
  Filled 2024-12-28 (×8): qty 1

## 2024-12-28 MED ORDER — SODIUM CHLORIDE 0.9 % IN NEBU: 3.0000 mL | INHALATION_SOLUTION | Freq: Three times a day (TID) | RESPIRATORY_TRACT | Status: DC | PRN

## 2024-12-28 MED ORDER — PROSOURCE TF20 ENFIT COMPATIBL EN LIQD
60.0000 mL | Freq: Two times a day (BID) | ENTERAL | Status: DC
  Administered 2024-12-28 – 2024-12-30 (×4): 60 mL
  Filled 2024-12-28 (×4): qty 60

## 2024-12-28 MED ORDER — RELIZORB DEVI
1.0000 | Freq: Every day | Status: DC
  Administered 2024-12-29: 1

## 2024-12-28 MED ORDER — BANATROL TF EN LIQD: 60.0000 mL | Freq: Four times a day (QID) | ENTERAL | Status: DC

## 2024-12-28 MED ORDER — TRAZODONE HCL 50 MG PO TABS: 25.0000 mg | ORAL_TABLET | Freq: Every evening | ORAL | Status: DC | PRN

## 2024-12-28 MED ORDER — WARFARIN SODIUM 10 MG PO TABS: 10.0000 mg | ORAL_TABLET | Freq: Once | ORAL | Status: DC

## 2024-12-28 MED ORDER — MORPHINE SULFATE (PF) 2 MG/ML IV SOLN: 1.0000 mg | INTRAVENOUS | Status: DC | PRN

## 2024-12-28 MED ORDER — EMPAGLIFLOZIN 10 MG PO TABS: 10.0000 mg | ORAL_TABLET | Freq: Every day | ORAL | Status: DC

## 2024-12-28 MED ORDER — CALCIUM POLYCARBOPHIL 625 MG PO TABS
625.0000 mg | ORAL_TABLET | Freq: Every day | ORAL | Status: DC
  Administered 2024-12-29 – 2025-01-03 (×6): 625 mg
  Filled 2024-12-28 (×7): qty 1

## 2024-12-28 MED ORDER — EMPAGLIFLOZIN 10 MG PO TABS
10.0000 mg | ORAL_TABLET | Freq: Every day | ORAL | Status: DC
  Administered 2024-12-29 – 2025-01-05 (×8): 10 mg via ORAL
  Filled 2024-12-28 (×8): qty 1

## 2024-12-28 MED ORDER — LIDOCAINE 5 % EX PTCH: 1.0000 | MEDICATED_PATCH | CUTANEOUS | Status: DC

## 2024-12-28 MED ORDER — CYANOCOBALAMIN 1000 MCG/ML IJ SOLN
1000.0000 ug | INTRAMUSCULAR | Status: DC
  Administered 2025-01-04: 1000 ug via INTRAMUSCULAR
  Filled 2024-12-28: qty 1

## 2024-12-28 MED ORDER — DIPHENHYDRAMINE HCL 25 MG PO CAPS: 25.0000 mg | ORAL_CAPSULE | Freq: Four times a day (QID) | ORAL | Status: DC | PRN

## 2024-12-28 NOTE — H&P (Signed)
 "   Physical Medicine and Rehabilitation Admission H&P    CC: Functional deficits due to debility     HPI: Alejandro Lopez is a 44 year old male with PMHx of mild hypertension alcohol abuse and asthma who presented to [facility] ER on 11/22/24 with complaints of increased swelling to bilateral lower extremities, groin, and abdomen with reports of a fullness sensation in his lower back. He had recently completed a course of Zithromax  for community acquired pneumonia, shortness of breath and nonproductive cough remained.  In the ER patient was hemodynamically stable with oxygen  saturation 97-99% room air.  WBC 10.6, hemoglobin 15.1, platelets 206.  AST 71, ALT 100, alk phosphatase 148, total bilirubin 1.3.  Sodium 141, potassium 3.8, bicarbonate 28, serum creatinine 0.89.  TSH 1.860.  UA was negative for pyuria.  proBNP 3354.  Troponin 27>> 27.  Chest x-ray was negative for edema or infiltrates.  EKG showed sinus rhythm with nonspecific T wave change.  Patient was started on IV furosemide  and cardiology was consulted for management.  2D echocardiogram on 12/1 showed LVEF 35 to 40%, with severely reduced RV systolic function, severe MR, and severe aortic insufficiency.  There is some nodular thickening at the leaflet tips.  The patient had no evidence of fever or chills and WBC count 10.6 on admission.  Volume status continued to improve with diuresis and GDMT was added for HFrEF/NICM.  On 12/4 cardiology recommended transfer to [facility] for advanced heart failure team assessment.   CTS consulted on 12/5 where a cardiac MRI showed a diastolic diameter of 7.7 cm.  Left ventricular ejection fraction 35% with global hypokinesis.  He underwent right cardiac catheterization which showed normal coronary anatomy.  Etiology suspected to be due to longstanding aortic insufficiency leading to left ventricular dilation and dysfunction with secondary MR due to leaflet tethering and restriction, also complicated by  history of heavy alcohol use.  Patient was not a candidate for transplant. He underwent TEE, AVR and MVR with Impella placement on 12/8 -12/24 Dr. Sherrine.  Postop he is transferred to ICU and recovered well initially.  However postop course became complicated after right thoracentesis with intercostal artery laceration and massive right hemothorax, hemodynamic instability and development of an acute abdomen with air within the wall of the distal small bowel and cecum.  IR consulted on 12/11 for emergent IR embolization performed by Dr. Philip.  On 12/12 patient underwent exploratory lap and small bowel resection with wound VAC placement by Dr. Sebastian.    He returned to the OR on 12/15 for reexploration of abdomen and right colectomy by Dr. Tanda.  On 12/16 patient required small bowel resection of 75 cm of distal ileum with right hemicolectomy and ileostomy. Chest tubes placed for right hemothorax.  He was extubated on 12/19 but reintubated on 12/21 with findings of an ileus.  Tracheostomy was placed on 12/23 and he became febrile postprocedure. IV antibiotics Maxipime  initiated per ID recommendations for positive micafungin  cultures, then transitioned to cefepime .  Impella was removed in the OR on 12/24. Hospital course complicated by atrial fibs/flutter, acute respiratory failure, sepsis with septic shock due to recurrent aspiration pneumonia with Enterobacter, alcohol withdrawal, acute septic encephalopathy, high ostomy output, and anemia of critical illness.  Maintained on heparin  anticoagulation for AF, DVT prophylaxis and AVR/MVR with pericardial valves.  Pharmacy investigated for heparin  to Coumadin  bridge.    He was decannulated on 1/5 and advance to a regular diet, thin liquids and has been tolerating  very well with no concerns for aspiration during speech evaluation.  Cortrak remains while calorie count is underway. 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  Per chart review he lives in a 1 level home with one-step to enter with spouse and 3 children.  Currently, he is mod assist for mobility, min assist to power up to stand for transfers, and CGA with supervision for safety ambulating at a reduced speed without dynamic gait challenges at distance of 550 feet with walker. Therapy evaluations completed due to patient decreased functional mobility was admitted for a comprehensive rehab program.   Review of Systems  Constitutional:  Positive for malaise/fatigue.  HENT:  Positive for congestion.   Eyes: Negative.   Respiratory:  Positive for cough and shortness of breath.   Cardiovascular: Negative.   Gastrointestinal: Negative.   Genitourinary: Negative.   Musculoskeletal:  Positive for myalgias.  Neurological:  Positive for weakness.        Past Medical History:  Diagnosis Date   Asthma    Past Surgical History:  Procedure Laterality Date   AORTIC VALVE REPLACEMENT N/A 11/30/2024   Procedure: REPLACEMENT, AORTIC VALVE, OPEN WITH INSPIRIS RESILIA AORTIC VALVE 23mm;  Surgeon: Lucas Dorise POUR, MD;  Location: [facility] OR;  Service: Open Heart Surgery;  Laterality: N/A;   BOWEL RESECTION N/A 12/08/2024   Procedure: EXCISION, SMALL INTESTINE;  Surgeon: Ebbie Cough, MD;  Location: [facility] OR;  Service: General;  Laterality: N/A;   COLOSTOMY REVISION Right 12/06/2024   Procedure: COLECTOMY, RIGHT;  Surgeon: Tanda Locus, MD;  Location: [facility] OR;  Service: General;  Laterality: Right;   FRACTURE SURGERY     ILEOSTOMY N/A 12/08/2024   Procedure: CREATION, ILEOSTOMY END;  Surgeon: Ebbie Cough, MD;  Location: [facility] OR;  Service: General;  Laterality: N/A;   INTRAOPERATIVE TRANSESOPHAGEAL ECHOCARDIOGRAM N/A 11/30/2024   Procedure: ECHOCARDIOGRAM, TRANSESOPHAGEAL, INTRAOPERATIVE;  Surgeon: Lucas Dorise POUR, MD;  Location: [facility] OR;  Service: Open Heart Surgery;  Laterality: N/A;   IR ANGIOGRAM FOLLOW UP STUDY  12/04/2024   IR ANGIOGRAM  SELECTIVE EACH ADDITIONAL VESSEL  12/04/2024   IR ANGIOGRAM VISCERAL SELECTIVE  12/04/2024   IR EMBO ART  VEN HEMORR LYMPH EXTRAV  INC GUIDE ROADMAPPING  12/04/2024   IR US  GUIDE VASC ACCESS RIGHT  12/04/2024   LAPAROTOMY N/A 12/03/2024   Procedure: LAPAROTOMY, EXPLORATORY small bowel resection abthrea therapy;  Surgeon: Sebastian Moles, MD;  Location: [facility] OR;  Service: General;  Laterality: N/A;  possible bowel resection   LAPAROTOMY N/A 12/06/2024   Procedure: LAPAROTOMY, EXPLORATORY; WOUND VAC CHANGE;  Surgeon: Tanda Locus, MD;  Location: [facility] OR;  Service: General;  Laterality: N/A;   LAPAROTOMY N/A 12/08/2024   Procedure: MACARIO LIVINGS;  Surgeon: Ebbie Cough, MD;  Location: [facility] OR;  Service: General;  Laterality: N/A;   MITRAL VALVE REPLACEMENT N/A 11/30/2024   Procedure: REPLACEMENT, MITRAL VALVE WITH MITRIS RESILIA MITRAL VALVE 27mm;  Surgeon: Lucas Dorise POUR, MD;  Location: [facility] OR;  Service: Open Heart Surgery;  Laterality: N/A;   PLACEMENT OF IMPELLA LEFT VENTRICULAR ASSIST DEVICE N/A 11/30/2024   Procedure: INSERTION, CARDIAC ASSIST DEVICE, IMPELLA;  Surgeon: Lucas Dorise POUR, MD;  Location: [facility] OR;  Service: Open Heart Surgery;  Laterality: N/A;  IMPELLA 5.5 INSERTION   REMOVAL OF IMPELLA LEFT VENTRICULAR ASSIST DEVICE Right 12/16/2024   Procedure: REMOVAL, CARDIAC ASSIST DEVICE, IMPELLA;  Surgeon: Lucas Dorise POUR, MD;  Location: [facility] OR;  Service: Open Heart  Surgery;  Laterality: Right;   RIGHT HEART CATH AND CORONARY ANGIOGRAPHY N/A 11/27/2024   Procedure: RIGHT HEART CATH AND CORONARY ANGIOGRAPHY;  Surgeon: Zenaida Morene PARAS, MD;  Location: [facility] INVASIVE CV LAB;  Service: Cardiovascular;  Laterality: N/A;   TRANSESOPHAGEAL ECHOCARDIOGRAM (CATH LAB) N/A 11/27/2024   Procedure: TRANSESOPHAGEAL ECHOCARDIOGRAM;  Surgeon: Zenaida Morene PARAS, MD;  Location: [facility] INVASIVE CV LAB;  Service: Cardiovascular;  Laterality: N/A;   VIDEO ASSISTED THORACOSCOPY (VATS)/DECORTICATION Right  12/10/2024   Procedure: VIDEO ASSISTED THORACOSCOPY (VATS)/DECORTICATION;  Surgeon: Shyrl Linnie KIDD, MD;  Location: [facility] OR;  Service: Thoracic;  Laterality: Right;   Family History  Problem Relation Age of Onset   Stroke Mother    Hypertension Mother    Diabetes Mother    Social History:  reports that he has been smoking cigarettes. He has never used smokeless tobacco. He reports current alcohol use of about 4.0 standard drinks of alcohol per week. He reports current drug use. Drug: Marijuana. Allergies: Allergies[1] Medications Prior to Admission  Medication Sig Dispense Refill   albuterol  (VENTOLIN  HFA) 108 (90 Base) MCG/ACT inhaler Inhale 2 puffs into the lungs every 4 (four) hours as needed. 18 g 0   guaiFENesin  (MUCINEX ) 600 MG 12 hr tablet Take 1,200 mg by mouth 2 (two) times daily as needed for to loosen phlegm or cough.     ibuprofen  (ADVIL ) 800 MG tablet Take 1 tablet (800 mg total) by mouth every 8 (eight) hours as needed for moderate pain. 20 tablet 0   azithromycin  (ZITHROMAX ) 250 MG tablet Take 250 mg by mouth as directed. (Patient not taking: Reported on 11/23/2024)        Home: Home Living Family/patient expects to be discharged to:: Private residence Living Arrangements: Spouse/significant other, Children Available Help at Discharge: Family, Available 24 hours/day Type of Home: House Home Access: Stairs to enter [employer] of Steps: 1 Entrance Stairs-Rails: None Home Layout: One level Bathroom Shower/Tub: Tub/shower unit, Engineer, Building Services: Pharmacist, Community: Yes Home Equipment: None Additional Comments: reading glasses  Lives With: Spouse (and 3 children)   Functional History: Prior Function Prior Level of Function : Independent/Modified Independent, Driving, Working/employed Mobility Comments: independent ADLs Comments: independent, full time forklift driver  Functional Status:  Mobility: Bed Mobility Overal bed  mobility: Needs Assistance Bed Mobility: Rolling, Sidelying to Sit Rolling: Contact guard assist Sidelying to sit: Min assist Supine to sit: Total assist Sit to sidelying: Mod assist General bed mobility comments: Cues to roll to side then sit up from sideling, minA at trunk, good initiation noted by pt Transfers Overall transfer level: Needs assistance Equipment used:  (EVA walker) Transfers: Sit to/from Stand Sit to Stand: Min assist, Contact guard assist Bed to/from chair/wheelchair/BSC transfer type:: Step pivot Step pivot transfers: Min assist  Lateral/Scoot Transfers: Min assist, +2 physical assistance General transfer comment: Cued pt to scoot to edge of chair/bed and rock to gain momentum, bringing nose over toes to facilitate adequate anterior weight shift to power up to stand. Pt displayed good compliance with his sternal precautions throughout. MinA to power up to stand x1 rep then progressed to CGA the other x5 reps Ambulation/Gait Ambulation/Gait assistance: Supervision, Contact guard assist Gait Distance (Feet): 550 Feet Assistive device: Elyn Finder Gait Pattern/deviations: Step-through pattern, Decreased step length - right, Decreased step length - left, Decreased stride length, Trunk flexed General Gait Details: Pt demonstrated good carryover of cues to stand upright. He needs cues to pace himself and take standing rest breaks as needed.  he continues to ambulate at a reduced speed without dynamic gait challenges. Cued pt to turn his head to challenge his vestibular system while ambulating, but only moved his head slightly, preferring to keep it facing anteriorly. CGA-supervision for safety Gait velocity: reduced Gait velocity interpretation: <1.31 ft/sec, indicative of household ambulator    ADL: ADL Overall ADL's : Needs assistance/impaired Eating/Feeding: Set up Grooming: Set up, Supervision/safety, Sitting Grooming Details (indicate cue type and reason): EOB Upper  Body Bathing: Minimal assistance, Sitting Upper Body Bathing Details (indicate cue type and reason): for back Lower Body Bathing: Sit to/from stand, Moderate assistance Lower Body Bathing Details (indicate cue type and reason): knees down Upper Body Dressing : Sitting, Moderate assistance Lower Body Dressing: Minimal assistance, Sit to/from stand Lower Body Dressing Details (indicate cue type and reason): socks Toilet Transfer: Minimal assistance, Ambulation Toileting- Clothing Manipulation and Hygiene: Maximal assistance Toileting - Clothing Manipulation Details (indicate cue type and reason): ostomy bag Functional mobility during ADLs: Minimal assistance General ADL Comments: able to complete figure four positoinoing for LB ADL; asking questions regarding management of his ostomy  Cognition: Cognition Orientation Level: Oriented X4 Cognition Arousal: Alert Behavior During Therapy: WFL for tasks assessed/performed  Physical Exam: Blood pressure 112/79, pulse (!) 108, temperature 98.1 F (36.7 C), temperature source Oral, resp. rate (!) 26, height 5' 10 (1.778 m), weight 64.2 kg, SpO2 100%. Physical Exam Constitutional:      General: He is not in acute distress. HENT:     Head: Normocephalic.     Right Ear: External ear normal.     Left Ear: External ear normal.     Nose: Nose normal.     Mouth/Throat:     Mouth: Mucous membranes are moist.  Eyes:     Pupils: Pupils are equal, round, and reactive to light.  Neck:     Comments: Trach stoma pink, dressed Cardiovascular:     Rate and Rhythm: Regular rhythm. Tachycardia present.  Pulmonary:     Effort: Pulmonary effort is normal. No respiratory distress.  Abdominal:     Palpations: Abdomen is soft.     Tenderness: There is abdominal tenderness.     Comments: Abdominal wound clean, dressed. Ostomy sealed  Musculoskeletal:        General: No swelling.     Cervical back: Normal range of motion.  Skin:    General: Skin is  warm.  Neurological:     Mental Status: He is alert.     Comments: Alert and oriented x 3. Normal insight and awareness. Intact Memory. Normal language and speech. Cranial nerve exam unremarkable. MMT: BUE 4/5. BLE 3-4/5 prox to distal. Sensory exam normal for light touch and pain in all 4 limbs. No limb ataxia or cerebellar signs. No abnormal tone appreciated.  SABRA    Psychiatric:     Comments: Pt a little flat but appropriate     Results for orders placed or performed during the hospital encounter of 11/22/24 (from the past 48 hours)  Magnesium      Status: None   Collection Time: 12/27/24  1:26 AM  Result Value Ref Range   Magnesium  1.8 1.7 - 2.4 mg/dL    Comment: Performed at [facility] Lab, [address] [place], [place] [address]  Basic metabolic panel     Status: Abnormal   Collection Time: 12/27/24  1:26 AM  Result Value Ref Range   Sodium 129 (L) 135 - 145 mmol/L   Potassium 4.9 3.5 - 5.1  mmol/L   Chloride 98 98 - 111 mmol/L   CO2 21 (L) 22 - 32 mmol/L   Glucose, Bld 127 (H) 70 - 99 mg/dL    Comment: Glucose reference range applies only to samples taken after fasting for at least 8 hours.   BUN 17 6 - 20 mg/dL   Creatinine, Ser 9.44 (L) 0.61 - 1.24 mg/dL   Calcium  8.8 (L) 8.9 - 10.3 mg/dL   GFR, Estimated >39 >39 mL/min    Comment: (NOTE) Calculated using the CKD-EPI Creatinine Equation (2021)    Anion gap 9 5 - 15    Comment: Performed at [facility], [address], [place], [place] [address]  Protime-INR     Status: None   Collection Time: 12/27/24  1:26 AM  Result Value Ref Range   Prothrombin Time 14.5 11.4 - 15.2 seconds   INR 1.1 0.8 - 1.2    Comment: (NOTE) INR goal varies based on device and disease states. Performed at [facility], [address] [place], [place] [address]   CBC     Status: Abnormal   Collection Time: 12/27/24  1:26 AM  Result Value Ref Range   WBC 11.2 (H) 4.0 - 10.5 K/uL   RBC 2.66 (L) 4.22 - 5.81 MIL/uL    Hemoglobin 7.9 (L) 13.0 - 17.0 g/dL   HCT 75.8 (L) 60.9 - 47.9 %   MCV 90.6 80.0 - 100.0 fL   MCH 29.7 26.0 - 34.0 pg   MCHC 32.8 30.0 - 36.0 g/dL   RDW 83.0 (H) 88.4 - 84.4 %   Platelets 428 (H) 150 - 400 K/uL   nRBC 0.0 0.0 - 0.2 %    Comment: Performed at [facility], [address], [place], [place] [address]  Prealbumin     Status: None   Collection Time: 12/27/24 10:16 AM  Result Value Ref Range   Prealbumin 18 18 - 38 mg/dL    Comment: Performed at [facility], [address] [place], [place] [address]  Albumin      Status: Abnormal   Collection Time: 12/27/24 10:16 AM  Result Value Ref Range   Albumin  2.6 (L) 3.5 - 5.0 g/dL    Comment: Performed at [facility] Lab, [address] [place], [place] [address]  Magnesium      Status: Abnormal   Collection Time: 12/28/24  1:19 AM  Result Value Ref Range   Magnesium  1.5 (L) 1.7 - 2.4 mg/dL    Comment: Performed at [facility], [address] [place], [place] [address]  Protime-INR     Status: None   Collection Time: 12/28/24  1:19 AM  Result Value Ref Range   Prothrombin Time 14.5 11.4 - 15.2 seconds   INR 1.1 0.8 - 1.2    Comment: (NOTE) INR goal varies based on device and disease states. Performed at [facility], [address] [place], [place] [address]   CBC     Status: Abnormal   Collection Time: 12/28/24  1:19 AM  Result Value Ref Range   WBC 11.8 (H) 4.0 - 10.5 K/uL   RBC 2.85 (L) 4.22 - 5.81 MIL/uL   Hemoglobin 8.3 (L) 13.0 - 17.0 g/dL   HCT 74.5 (L) 60.9 - 47.9 %   MCV 89.1 80.0 - 100.0 fL   MCH 29.1 26.0 - 34.0 pg   MCHC 32.7 30.0 - 36.0 g/dL   RDW 83.1 (H) 88.4 -  15.5 %   Platelets 424 (H) 150 - 400 K/uL   nRBC 0.3 (H) 0.0 - 0.2 %    Comment: Performed at [facility] [place] [facility], [address] [place], [place] [address]  Basic metabolic panel with GFR     Status: Abnormal   Collection Time: 12/28/24  1:19 AM  Result Value Ref Range   Sodium 129 (L) 135 - 145 mmol/L    Potassium 5.3 (H) 3.5 - 5.1 mmol/L   Chloride 97 (L) 98 - 111 mmol/L   CO2 20 (L) 22 - 32 mmol/L   Glucose, Bld 141 (H) 70 - 99 mg/dL    Comment: Glucose reference range applies only to samples taken after fasting for at least 8 hours.   BUN 17 6 - 20 mg/dL   Creatinine, Ser 9.41 (L) 0.61 - 1.24 mg/dL   Calcium  9.0 8.9 - 10.3 mg/dL   GFR, Estimated >39 >39 mL/min    Comment: (NOTE) Calculated using the CKD-EPI Creatinine Equation (2021)    Anion gap 12 5 - 15    Comment: Performed at [facility] Lab, [address] [place], [place] [address]   No results found.    Blood pressure 112/79, pulse (!) 108, temperature 98.1 F (36.7 C), temperature source Oral, resp. rate (!) 26, height 5' 10 (1.778 m), weight 64.2 kg, SpO2 100%.  Medical Problem List and Plan: 1. Functional deficits secondary to debility after septic shock, ischemic bowel and associated medical and surgical course  -patient may shower  -ELOS/Goals: 7-10 days, mod I to supervision  2.  Antithrombotics: -DVT/anticoagulation:  Mechanical: Sequential compression devices, below knee Bilateral lower extremities Pharmaceutical: Coumadin   -antiplatelet therapy: N/A  3. Pain Management: Lidoderm  patch.  As needed: Tylenol  and oxycodone .   4. Mood/Behavior/Sleep: LCSW to follow for evaluation and support when available.   - Agitation:  Seroquel  25 mg nightly  5. Neuropsych/cognition: This patient is capable of making decisions on his own behalf.  6. Skin/Wound Care: Routine pressure relief measures.  WOC consulted for ongoing management of ostomy and abdominal wound.  Monitor sites for infection.  7. Fluids/Electrolytes/Nutrition: Monitor strict I&O and daily weights. Follow up labs magnesium /CBC/CMP/INR in a.m.  -SLP/RD Consults for [place] TFs: Con't TF at current rates due to difficulty tolerating due to cramping. During the day decrease by 30% from goal and increase nightly TF by 30%. Encouraging him to eat and  drink.  -Continue PPI    8. Acute biventricular HFrEF with cardiogenic shock status post:  -warfarin for 3 months, heparin  bridge. Pharmacy to dose both.-Anticoagulation held because of blood in ostomy output. INR 1.1 -GDMT - dig, spiro, losartan .  -con't amiodarone  200mg  BID   -con't colchine for pericardial effusion   9. Acute respiratory failure with hypoxia and hypercapnia status post tracheostomy:  -Trach capped x 3 days. Decannulated today -occlusive dressing to trach stoma.  -con't Yupelri  and Brovan nebs. Encourage insp spiro.   10.Sepsis with septic shock due to recurrent aspiration pneumonia -Enterobacter:   -complete 7 days of ceftriaxone ; last day 12/29/24   11. 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 Anticipate he would benefit from Creon for short gut once off TF.  -may require vitamin B12 injections.  Monitor fat-soluble vitamin levels while here.  12. Anemia of critical  illness and previous ABLA from hemothorax: Monitor CBC, transfuse Hb<7   13. Hyponatremia: Na 129  -ensure drinking fluids with electrolytes due to high fluid needs.     Daphne LOISE Satterfield, NP 12/28/2024     [1] No Known Allergies  "

## 2024-12-28 NOTE — Progress Notes (Addendum)
 Patient decannulated with RT x2 @ bedside, no complications noted. Patient on RA with VSS.

## 2024-12-28 NOTE — H&P (Signed)
 Physical Medicine and Rehabilitation Admission H&P     CC: Functional deficits due to debility       HPI: Alejandro Lopez is a 44 year old male with PMHx of mild hypertension alcohol abuse and asthma who presented to [facility] ER on 11/22/24 with complaints of increased swelling to bilateral lower extremities, groin, and abdomen with reports of a fullness sensation in his lower back. He had recently completed a course of Zithromax  for community acquired pneumonia, shortness of breath and nonproductive cough remained.  In the ER patient was hemodynamically stable with oxygen  saturation 97-99% room air.  WBC 10.6, hemoglobin 15.1, platelets 206.  AST 71, ALT 100, alk phosphatase 148, total bilirubin 1.3.  Sodium 141, potassium 3.8, bicarbonate 28, serum creatinine 0.89.  TSH 1.860.  UA was negative for pyuria.  proBNP 3354.  Troponin 27>> 27.  Chest x-ray was negative for edema or infiltrates.  EKG showed sinus rhythm with nonspecific T wave change.  Patient was started on IV furosemide  and cardiology was consulted for management.  2D echocardiogram on 12/1 showed LVEF 35 to 40%, with severely reduced RV systolic function, severe MR, and severe aortic insufficiency.  There is some nodular thickening at the leaflet tips.  The patient had no evidence of fever or chills and WBC count 10.6 on admission.  Volume status continued to improve with diuresis and GDMT was added for HFrEF/NICM.  On 12/4 cardiology recommended transfer to [facility] for advanced heart failure team assessment.    CTS consulted on 12/5 where a cardiac MRI showed a diastolic diameter of 7.7 cm.  Left ventricular ejection fraction 35% with global hypokinesis.  He underwent right cardiac catheterization which showed normal coronary anatomy.  Etiology suspected to be due to longstanding aortic insufficiency leading to left ventricular dilation and dysfunction with secondary MR due to leaflet tethering and restriction, also complicated by  history of heavy alcohol use.  Patient was not a candidate for transplant. He underwent TEE, AVR and MVR with Impella placement on 12/8 -12/24 Dr. Sherrine.  Postop he is transferred to ICU and recovered well initially.  However postop course became complicated after right thoracentesis with intercostal artery laceration and massive right hemothorax, hemodynamic instability and development of an acute abdomen with air within the wall of the distal small bowel and cecum.  IR consulted on 12/11 for emergent IR embolization performed by Dr. Philip.  On 12/12 patient underwent exploratory lap and small bowel resection with wound VAC placement by Dr. Sebastian.     He returned to the OR on 12/15 for reexploration of abdomen and right colectomy by Dr. Tanda.  On 12/16 patient required small bowel resection of 75 cm of distal ileum with right hemicolectomy and ileostomy. Chest tubes placed for right hemothorax.  He was extubated on 12/19 but reintubated on 12/21 with findings of an ileus.  Tracheostomy was placed on 12/23 and he became febrile postprocedure. IV antibiotics Maxipime  initiated per ID recommendations for positive micafungin  cultures, then transitioned to cefepime .  Impella was removed in the OR on 12/24. Hospital course complicated by atrial fibs/flutter, acute respiratory failure, sepsis with septic shock due to recurrent aspiration pneumonia with Enterobacter, alcohol withdrawal, acute septic encephalopathy, high ostomy output, and anemia of critical illness.  Maintained on heparin  anticoagulation for AF, DVT prophylaxis and AVR/MVR with pericardial valves.  Pharmacy investigated for heparin  to Coumadin  bridge.     He was decannulated on 1/5 and advance to a regular diet, thin liquids and  has been tolerating very well with no concerns for aspiration during speech evaluation.  Cortrak remains while calorie count is underway. 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  Per chart review he lives in a 1 level home with one-step to enter with spouse and 3 children.  Currently, he is mod assist for mobility, min assist to power up to stand for transfers, and CGA with supervision for safety ambulating at a reduced speed without dynamic gait challenges at distance of 550 feet with walker. Therapy evaluations completed due to patient decreased functional mobility was admitted for a comprehensive rehab program.     Review of Systems  Constitutional:  Positive for malaise/fatigue.  HENT:  Positive for congestion.   Eyes: Negative.   Respiratory:  Positive for cough and shortness of breath.   Cardiovascular: Negative.   Gastrointestinal: Negative.   Genitourinary: Negative.   Musculoskeletal:  Positive for myalgias.  Neurological:  Positive for weakness.                 Past Medical History:  Diagnosis Date   Asthma               Past Surgical History:  Procedure Laterality Date   AORTIC VALVE REPLACEMENT N/A 11/30/2024    Procedure: REPLACEMENT, AORTIC VALVE, OPEN WITH INSPIRIS RESILIA AORTIC VALVE 23mm;  Surgeon: Lucas Dorise POUR, MD;  Location: [facility] OR;  Service: Open Heart Surgery;  Laterality: N/A;   BOWEL RESECTION N/A 12/08/2024    Procedure: EXCISION, SMALL INTESTINE;  Surgeon: Ebbie Cough, MD;  Location: [facility] OR;  Service: General;  Laterality: N/A;   COLOSTOMY REVISION Right 12/06/2024    Procedure: COLECTOMY, RIGHT;  Surgeon: Tanda Locus, MD;  Location[facility] OR;  Service: General;  Laterality: Right;   FRACTURE SURGERY       ILEOSTOMY N/A 12/08/2024    Procedure: CREATION, ILEOSTOMY END;  Surgeon: Ebbie Cough, MD;  [facility] OR;  Service: General;  Laterality: N/A;   INTRAOPERATIVE TRANSESOPHAGEAL ECHOCARDIOGRAM N/A 11/30/2024    Procedure: ECHOCARDIOGRAM, TRANSESOPHAGEAL, INTRAOPERATIVE;  Surgeon: Lucas Dorise POUR, MD;  Location: [facility] OR;  Service: Open Heart Surgery;  Laterality: N/A;   IR ANGIOGRAM FOLLOW UP  STUDY   12/04/2024   IR ANGIOGRAM SELECTIVE EACH ADDITIONAL VESSEL   12/04/2024   IR ANGIOGRAM VISCERAL SELECTIVE   12/04/2024   IR EMBO ART  VEN HEMORR LYMPH EXTRAV  INC GUIDE ROADMAPPING   12/04/2024   IR US  GUIDE VASC ACCESS RIGHT   12/04/2024   LAPAROTOMY N/A 12/03/2024    Procedure: LAPAROTOMY, EXPLORATORY small bowel resection abthrea therapy;  Surgeon: Sebastian Moles, MD;  Location: [facility] OR;  Service: General;  Laterality: N/A;  possible bowel resection   LAPAROTOMY N/A 12/06/2024    Procedure: LAPAROTOMY, EXPLORATORY; WOUND VAC CHANGE;  Surgeon: Tanda Locus, MD;  Location: [facility] OR;  Service: General;  Laterality: N/A;   LAPAROTOMY N/A 12/08/2024    Procedure: MACARIO LIVINGS;  Surgeon: Ebbie Cough, MD;  Location: [facility] OR;  Service: General;  Laterality: N/A;   MITRAL VALVE REPLACEMENT N/A 11/30/2024    Procedure: REPLACEMENT, MITRAL VALVE WITH MITRIS RESILIA MITRAL VALVE 27mm;  Surgeon: Lucas Dorise POUR, MD;  Location: [facility] OR;  Service: Open Heart Surgery;  Laterality: N/A;   PLACEMENT OF IMPELLA LEFT VENTRICULAR ASSIST DEVICE N/A 11/30/2024    Procedure: INSERTION, CARDIAC ASSIST DEVICE, IMPELLA;  Surgeon: Lucas Dorise POUR, MD;  Location: [facility] OR;  Service: Open  Heart Surgery;  Laterality: N/A;  IMPELLA 5.5 INSERTION   REMOVAL OF IMPELLA LEFT VENTRICULAR ASSIST DEVICE Right 12/16/2024    Procedure: REMOVAL, CARDIAC ASSIST DEVICE, IMPELLA;  Surgeon: Lucas Dorise POUR, MD;  Location: [facility] OR;  Service: Open Heart Surgery;  Laterality: Right;   RIGHT HEART CATH AND CORONARY ANGIOGRAPHY N/A 11/27/2024    Procedure: RIGHT HEART CATH AND CORONARY ANGIOGRAPHY;  Surgeon: Zenaida Morene PARAS, MD;  Location: [facility] INVASIVE CV LAB;  Service: Cardiovascular;  Laterality: N/A;   TRANSESOPHAGEAL ECHOCARDIOGRAM (CATH LAB) N/A 11/27/2024    Procedure: TRANSESOPHAGEAL ECHOCARDIOGRAM;  Surgeon: Zenaida Morene PARAS, MD;  Location: [facility] INVASIVE CV LAB;  Service: Cardiovascular;  Laterality: N/A;   VIDEO ASSISTED  THORACOSCOPY (VATS)/DECORTICATION Right 12/10/2024    Procedure: VIDEO ASSISTED THORACOSCOPY (VATS)/DECORTICATION;  Surgeon: Shyrl Linnie KIDD, MD;  Location: [facility] OR;  Service: Thoracic;  Laterality: Right;             Family History  Problem Relation Age of Onset   Stroke Mother     Hypertension Mother     Diabetes Mother          Social History:  reports that he has been smoking cigarettes. He has never used smokeless tobacco. He reports current alcohol use of about 4.0 standard drinks of alcohol per week. He reports current drug use. Drug: Marijuana. Allergies: [Allergies]  [Allergies] No Known Allergies       Medications Prior to Admission  Medication Sig Dispense Refill   albuterol  (VENTOLIN  HFA) 108 (90 Base) MCG/ACT inhaler Inhale 2 puffs into the lungs every 4 (four) hours as needed. 18 g 0   guaiFENesin  (MUCINEX ) 600 MG 12 hr tablet Take 1,200 mg by mouth 2 (two) times daily as needed for to loosen phlegm or cough.       ibuprofen  (ADVIL ) 800 MG tablet Take 1 tablet (800 mg total) by mouth every 8 (eight) hours as needed for moderate pain. 20 tablet 0   azithromycin  (ZITHROMAX ) 250 MG tablet Take 250 mg by mouth as directed. (Patient not taking: Reported on 11/23/2024)                  Home: Home Living Family/patient expects to be discharged to:: Private residence Living Arrangements: Spouse/significant other, Children Available Help at Discharge: Family, Available 24 hours/day Type of Home: House Home Access: Stairs to enter [employer] of Steps: 1 Entrance Stairs-Rails: None Home Layout: One level Bathroom Shower/Tub: Tub/shower unit, Engineer, Building Services: Pharmacist, Community: Yes Home Equipment: None Additional Comments: reading glasses  Lives With: Spouse (and 3 children)   Functional History: Prior Function Prior Level of Function : Independent/Modified Independent, Driving, Working/employed Mobility Comments:  independent ADLs Comments: independent, full time forklift driver   Functional Status:  Mobility: Bed Mobility Overal bed mobility: Needs Assistance Bed Mobility: Rolling, Sidelying to Sit Rolling: Contact guard assist Sidelying to sit: Min assist Supine to sit: Total assist Sit to sidelying: Mod assist General bed mobility comments: Cues to roll to side then sit up from sideling, minA at trunk, good initiation noted by pt Transfers Overall transfer level: Needs assistance Equipment used:  (EVA walker) Transfers: Sit to/from Stand Sit to Stand: Min assist, Contact guard assist Bed to/from chair/wheelchair/BSC transfer type:: Step pivot Step pivot transfers: Min assist  Lateral/Scoot Transfers: Min assist, +2 physical assistance General transfer comment: Cued pt to scoot to edge of chair/bed and rock to gain momentum, bringing nose over toes to facilitate adequate anterior weight shift to power  up to stand. Pt displayed good compliance with his sternal precautions throughout. MinA to power up to stand x1 rep then progressed to CGA the other x5 reps Ambulation/Gait Ambulation/Gait assistance: Supervision, Contact guard assist Gait Distance (Feet): 550 Feet Assistive device: Elyn Finder Gait Pattern/deviations: Step-through pattern, Decreased step length - right, Decreased step length - left, Decreased stride length, Trunk flexed General Gait Details: Pt demonstrated good carryover of cues to stand upright. He needs cues to pace himself and take standing rest breaks as needed. he continues to ambulate at a reduced speed without dynamic gait challenges. Cued pt to turn his head to challenge his vestibular system while ambulating, but only moved his head slightly, preferring to keep it facing anteriorly. CGA-supervision for safety Gait velocity: reduced Gait velocity interpretation: <1.31 ft/sec, indicative of household ambulator   ADL: ADL Overall ADL's : Needs  assistance/impaired Eating/Feeding: Set up Grooming: Set up, Supervision/safety, Sitting Grooming Details (indicate cue type and reason): EOB Upper Body Bathing: Minimal assistance, Sitting Upper Body Bathing Details (indicate cue type and reason): for back Lower Body Bathing: Sit to/from stand, Moderate assistance Lower Body Bathing Details (indicate cue type and reason): knees down Upper Body Dressing : Sitting, Moderate assistance Lower Body Dressing: Minimal assistance, Sit to/from stand Lower Body Dressing Details (indicate cue type and reason): socks Toilet Transfer: Minimal assistance, Ambulation Toileting- Clothing Manipulation and Hygiene: Maximal assistance Toileting - Clothing Manipulation Details (indicate cue type and reason): ostomy bag Functional mobility during ADLs: Minimal assistance General ADL Comments: able to complete figure four positoinoing for LB ADL; asking questions regarding management of his ostomy   Cognition: Cognition Orientation Level: Oriented X4 Cognition Arousal: Alert Behavior During Therapy: WFL for tasks assessed/performed   Physical Exam: Blood pressure 112/79, pulse (!) 108, temperature 98.1 F (36.7 C), temperature source Oral, resp. rate (!) 26, height 5' 10 (1.778 m), weight 64.2 kg, SpO2 100%. Physical Exam Constitutional:      General: He is not in acute distress. HENT:     Head: Normocephalic.     Right Ear: External ear normal.     Left Ear: External ear normal.     Nose: Nose normal. Cortrak in place     Mouth/Throat:     Mouth: Mucous membranes are moist.  Eyes:     Pupils: Pupils are equal, round, and reactive to light.  Neck:     Comments: Trach stoma pink, dressed Cardiovascular:     Rate and Rhythm: Regular rhythm. Tachycardia present.  Pulmonary:     Effort: Pulmonary effort is normal. No respiratory distress.  Abdominal:     Palpations: Abdomen is soft.     Tenderness: There is abdominal tenderness.     Comments:  Abdominal wound clean, dressed. Ostomy sealed  Musculoskeletal:        General: No swelling.     Cervical back: Normal range of motion.  Skin:    General: Skin is warm.  Neurological:     Mental Status: He is alert.     Comments: Alert and oriented x 3. Normal insight and awareness. Intact Memory. Normal language and speech. Cranial nerve exam unremarkable. MMT: BUE 4/5. BLE 3-4/5 prox to distal. Sensory exam normal for light touch and pain in all 4 limbs. No limb ataxia or cerebellar signs. No abnormal tone appreciated.  SABRA    Psychiatric:     Comments: Pt a little flat but appropriate       Lab Results Last 48 Hours  Results for orders placed or performed during the hospital encounter of 11/22/24 (from the past 48 hours)  Magnesium      Status: None    Collection Time: 12/27/24  1:26 AM  Result Value Ref Range    Magnesium  1.8 1.7 - 2.4 mg/dL      Comment: Performed at [facility], [address], [place], [place] [address]  Basic metabolic panel     Status: Abnormal    Collection Time: 12/27/24  1:26 AM  Result Value Ref Range    Sodium 129 (L) 135 - 145 mmol/L    Potassium 4.9 3.5 - 5.1 mmol/L    Chloride 98 98 - 111 mmol/L    CO2 21 (L) 22 - 32 mmol/L    Glucose, Bld 127 (H) 70 - 99 mg/dL      Comment: Glucose reference range applies only to samples taken after fasting for at least 8 hours.    BUN 17 6 - 20 mg/dL    Creatinine, Ser 9.44 (L) 0.61 - 1.24 mg/dL    Calcium  8.8 (L) 8.9 - 10.3 mg/dL    GFR, Estimated >39 >39 mL/min      Comment: (NOTE) Calculated using the CKD-EPI Creatinine Equation (2021)      Anion gap 9 5 - 15      Comment: Performed at [facility], [address], [place], [place] [address]  Protime-INR     Status: None    Collection Time: 12/27/24  1:26 AM  Result Value Ref Range    Prothrombin Time 14.5 11.4 - 15.2 seconds    INR 1.1 0.8 - 1.2      Comment: (NOTE) INR goal varies based on device and disease states. Performed at  [facility], [address] [place], [place] [address]    CBC     Status: Abnormal    Collection Time: 12/27/24  1:26 AM  Result Value Ref Range    WBC 11.2 (H) 4.0 - 10.5 K/uL    RBC 2.66 (L) 4.22 - 5.81 MIL/uL    Hemoglobin 7.9 (L) 13.0 - 17.0 g/dL    HCT 75.8 (L) 60.9 - 52.0 %    MCV 90.6 80.0 - 100.0 fL    MCH 29.7 26.0 - 34.0 pg    MCHC 32.8 30.0 - 36.0 g/dL    RDW 83.0 (H) 88.4 - 15.5 %    Platelets 428 (H) 150 - 400 K/uL    nRBC 0.0 0.0 - 0.2 %      Comment: Performed at [facility], [address] [place], [place] [address]  Prealbumin     Status: None    Collection Time: 12/27/24 10:16 AM  Result Value Ref Range    Prealbumin 18 18 - 38 mg/dL      Comment: Performed at [facility], [address] [place], [place] [address]  Albumin      Status: Abnormal    Collection Time: 12/27/24 10:16 AM  Result Value Ref Range    Albumin  2.6 (L) 3.5 - 5.0 g/dL      Comment: Performed at [facility], [address], [place], [place] [address]  Magnesium      Status: Abnormal    Collection Time: 12/28/24  1:19 AM  Result Value Ref Range    Magnesium  1.5 (L) 1.7 - 2.4 mg/dL      Comment: Performed at [facility], [address] [place],  [place] [address]  Protime-INR     Status: None    Collection Time: 12/28/24  1:19 AM  Result Value Ref Range    Prothrombin Time 14.5 11.4 - 15.2 seconds    INR 1.1 0.8 - 1.2      Comment: (NOTE) INR goal varies based on device and disease states. Performed at [facility], [address] [place], [place] [address]    CBC     Status: Abnormal    Collection Time: 12/28/24  1:19 AM  Result Value Ref Range    WBC 11.8 (H) 4.0 - 10.5 K/uL    RBC 2.85 (L) 4.22 - 5.81 MIL/uL    Hemoglobin 8.3 (L) 13.0 - 17.0 g/dL    HCT 74.5 (L) 60.9 - 52.0 %    MCV 89.1 80.0 - 100.0 fL    MCH 29.1 26.0 - 34.0 pg    MCHC 32.7 30.0 - 36.0 g/dL    RDW 83.1 (H) 88.4 - 15.5 %    Platelets 424 (H) 150 - 400 K/uL    nRBC  0.3 (H) 0.0 - 0.2 %      Comment: Performed at [facility], [address] [place], [place] [address]  Basic metabolic panel with GFR     Status: Abnormal    Collection Time: 12/28/24  1:19 AM  Result Value Ref Range    Sodium 129 (L) 135 - 145 mmol/L    Potassium 5.3 (H) 3.5 - 5.1 mmol/L    Chloride 97 (L) 98 - 111 mmol/L    CO2 20 (L) 22 - 32 mmol/L    Glucose, Bld 141 (H) 70 - 99 mg/dL      Comment: Glucose reference range applies only to samples taken after fasting for at least 8 hours.    BUN 17 6 - 20 mg/dL    Creatinine, Ser 9.41 (L) 0.61 - 1.24 mg/dL    Calcium  9.0 8.9 - 10.3 mg/dL    GFR, Estimated >39 >39 mL/min      Comment: (NOTE) Calculated using the CKD-EPI Creatinine Equation (2021)      Anion gap 12 5 - 15      Comment: Performed at [facility], [address], [place], [place] [address]      Imaging Results (Last 48 hours)  No results found.         Blood pressure 112/79, pulse (!) 108, temperature 98.1 F (36.7 C), temperature source Oral, resp. rate (!) 26, height 5' 10 (1.778 m), weight 64.2 kg, SpO2 100%.   Medical Problem List and Plan: 1. Functional deficits secondary to debility after septic shock, ischemic bowel and associated medical and surgical course             -patient may shower             -ELOS/Goals: 7-10 days, mod I to supervision with PT, OT, SLP   2.  Antithrombotics: -DVT/anticoagulation:  Mechanical: Sequential compression devices, below knee Bilateral lower extremities Pharmaceutical: Coumadin              -antiplatelet therapy: N/A   3. Pain Management: Lidoderm  patch.  As needed: Tylenol  and oxycodone .    4. Mood/Behavior/Sleep: LCSW to follow for evaluation and support when available.              - Agitation:  Seroquel  25 mg nightly   5. Neuropsych/cognition: This patient is capable of making decisions on his own behalf.  6. Skin/Wound Care: Routine pressure relief measures.  WOC consulted for ongoing  management of ostomy and abdominal wound.  Monitor sites for infection.   7. Fluids/Electrolytes/Nutrition: Monitor strict I&O and daily weights. Follow up labs magnesium /CBC/CMP/INR in a.m.  -SLP/RD Consults for Cort[place] TFs: Con't TF at current rates due to difficulty tolerating due to cramping. During the day decrease by 30% from goal and increase nightly TF by 30%. Encouraging him to eat and drink.  -Continue PPI    8. Acute biventricular HFrEF with cardiogenic shock status post:  -warfarin for 3 months, heparin  bridge. Pharmacy to dose both.-Anticoagulation held because of blood in ostomy output. INR 1.1 -GDMT - dig, spiro, losartan .  -con't amiodarone  200mg  BID              -con't colchine for pericardial effusion    9. Acute respiratory failure with hypoxia and hypercapnia status post tracheostomy:  -Trach capped x 3 days. Decannulated today -occlusive dressing to trach stoma.  -con't Yupelri  and Brovan nebs. Encourage insp spiro.    10.Sepsis with septic shock due to recurrent aspiration pneumonia -Enterobacter:              -complete 7 days of ceftriaxone ; last day 12/29/24    11. 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 Anticipate he would benefit from Creon for short gut once off TF.  -may require vitamin B12 injections.  Monitor fat-soluble vitamin levels while here.   12. Anemia of critical illness and previous ABLA from hemothorax: Monitor CBC, transfuse Hb<7    13. Hyponatremia: Na 129  -ensure drinking fluids with electrolytes due to high fluid needs.        Daphne LOISE Satterfield, NP 12/28/2024  I have personally reviewed laboratory data, imaging studies, as well as relevant notes and concur with the physician  assistant's documentation above.  The patient's status has not changed from the original H&P.  Any changes in documentation from the acute care chart have been noted above.  Arthea IVAR Gunther, MD, LEELLEN

## 2024-12-28 NOTE — Plan of Care (Addendum)
"        Wound Plan   Braden Score: 17 Sensory: 4 Moisture: 3 Activity: 3 Mobility: 3 Nutrition: 2 Friction: 2  Wounds present:   Wound 12/05/24 1000 Pressure Injury Buttocks Mid Deep Tissue Pressure Injury -   WOC Nurse wound follow up Wound type: open surgical to mid-abd, improvement seen Measurement: 12 x 4 x 2.5 cm Wound bed: red, center of wound with valley Drainage serosanguinous drainage, no malodor Periwound: clean edges Dressing procedure/placement/frequency: VAC foam exchange Tues and Friday   Removed old NPWT of intact dressing and base of wound visualized, image taken Cleansed wound with normal saline x 2 Barrier strip placed adjacent to ostomy to create a barrier. Placed additional drape over sides of wound to prevent leakage Filled wound with  _0__ piece of Mepitel, ____0 piece of white foam, __01__ piece of black foam  Sealed NPWT dressing at Hg neg pressure Patient tolerated procedure well and no leak alarms noted, no VAC kits in room; coordinated this morning with primary nurse to have supplies ordered.    WOC nurse will continue to provide NPWT dressing changed due to the complexity of the dressing change.  Stoma type/location: Right lower abd, ileostomy  Appliance change was done after VAC dressing placed. Stomal assessment/size: 38 mm round, stoma is moist, red, viable, well-budded Peristomal assessment: intact plane, suturing intact Treatment options for stomal/peristomal skin: placed 1-piece convex, barrier ring Output  75 mls liquid brownish effluent Ostomy pouching: 1pc convex #[id number], barrier rings [phone]  Interventions: Education provided: reviewed frequency of change out, skin care needs, how to empty, patient will discharge to [facility], goal will be for him to change out appliance next VAC change.  Patient shows interest in learning how to care for his ostomy. Enrolled patient in [facility] Discharge program: No, not ready for  discharge   Dietician consult Regular diet Vitamin B 12 daily ProSource BID Banatrol TF QID Folic Acid  Gerhardt's butt cream MVI daily Fibercon daily Thiamine  daily ReliZorb cartridges   Contributors: Barnie Ronde, RN-BC, CRRN     "

## 2024-12-28 NOTE — Progress Notes (Addendum)
 "    Advanced Heart Failure Rounding Note  Cardiologist: None  Chief Complaint: Valvular Heart Disease & HFrEF Patient Profile   44 y.o. male w/ h/o heavy alcohol use (at least 5 drinks per evening) and h/o asthma admitted w/ acute systolic heart failure. Strong family history of cardiac disease: mother and maternal grandmother had heart failure and father with CAD. Admitted with acute HFrEF/cardiogenic shock. Echo w/ severe AR, MR and LV dysfunction.    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  12/9 VT/VF ? vagal episode. Extubated 12/11: S/p R thoracentesis with resultant hemothorax.  Chest tube placed. S/p R intercostal artery coil (IR). S/p exp lap for ischemic bowel resection, bowel left in discontinuity 12/15: Back to OR for ischemic R colon and mesenteric ischemia. S/p exp/lap, wound vac exchange, R colectomy without anastomosis.  12/16 Back to OR for small bowel resection and ileostomy  12/18 OR for VATS/hemothorax washout, pericardial window was not done.  12/19 extubated 12/21 Reintubated. Ileus 12/23 S/p Trach. Fever post procedure, Tmax 101. Abx switched to cefepime    12/24 Impella removed 12/25: DBA stopped. Diuretics held d/t low CVPs 12/26: Chest tube/foley out 12/31: Started 7 day course of ceftriaxone  for fevers  1/3: bleeding from ostomy output, heparin  gtt paused   Subjective:    Remains on coumadin  but continues off heparin  gtt. INR 1.1 today, Hgb 7.9>>8.3. RN reports BRBPR overnight.    K 5.3  Scr stable 0.58   Feels ok this morning. No major complaints. Denies dyspnea.    Objective:    Weight Range: 64.2 kg Body mass index is 20.31 kg/m.   Vital Signs:   Temp:  [97.5 F (36.4 C)-99.7 F (37.6 C)] 98.9 F (37.2 C) (01/05 0720) Pulse Rate:  [97-113] 107 (01/05 0520) Resp:  [19-39] 21 (01/05 0520) BP: (90-112)/(63-81) 95/63 (01/05 0520) SpO2:  [95 %-100 %] 98 % (01/05 0520) FiO2 (%):  [21 %] 21 % (01/04  1433) Weight:  [64.2 kg] 64.2 kg (01/05 0500) Last BM Date : 12/28/24  Weight change: Filed Weights   12/26/24 0619 12/27/24 0500 12/28/24 0500  Weight: 65.5 kg 65.1 kg 64.2 kg   Intake/Output:  Intake/Output Summary (Last 24 hours) at 12/28/2024 0740 Last data filed at 12/28/2024 9389 Gross per 24 hour  Intake 2299.75 ml  Output 2670 ml  Net -370.25 ml    Physical Exam   GENERAL: ill appearing, no distress  Neck: + Trach  Lungs- clear  CARDIAC:  JVP not elevated.          Normal rate with regular rhythm. No MRG. No LEE  ABDOMEN: + surgical dressing + ostomy  EXTREMITIES: Warm and well perfused.  NEUROLOGIC: No obvious FND    Telemetry    NSR 90s, personally reviewed   Labs    CBC Recent Labs    12/27/24 0126 12/28/24 0119  WBC 11.2* 11.8*  HGB 7.9* 8.3*  HCT 24.1* 25.4*  MCV 90.6 89.1  PLT 428* 424*   Basic Metabolic Panel Recent Labs    98/95/73 0126 12/28/24 0119  NA 129* 129*  K 4.9 5.3*  CL 98 97*  CO2 21* 20*  GLUCOSE 127* 141*  BUN 17 17  CREATININE 0.55* 0.58*  CALCIUM  8.8* 9.0  MG 1.8 1.5*   Liver Function Tests Recent Labs    12/27/24 1016  ALBUMIN  2.6*    ProBNP (last 3 results) Recent Labs    11/22/24 1958  PROBNP 3,354.0*  Fasting Lipid Panel No results for input(s): CHOL, HDL, LDLCALC, TRIG, CHOLHDL, LDLDIRECT in the last 72 hours.  Medications:    Scheduled Medications:  amiodarone   200 mg Per Tube BID   arformoterol   15 mcg Nebulization BID   Chlorhexidine  Gluconate Cloth  6 each Topical Daily   colchicine   0.3 mg Per Tube Daily   cyanocobalamin   1,000 mcg Intramuscular Weekly   digoxin   0.0625 mg Per Tube Daily   diphenoxylate -atropine   1 tablet Per Tube QID   feeding supplement (PROSource TF20)  60 mL Per Tube BID   fiber supplement (BANATROL TF)  60 mL Per Tube QID   folic acid   1 mg Oral QHS   free water   100 mL Per Tube Q4H   Gerhardt's butt cream   Topical TID   lidocaine   1 patch Transdermal  Q24H   loperamide  HCl  4 mg Per Tube Q6H   losartan   25 mg Per Tube Daily   multivitamin with minerals  1 tablet Per Tube Daily   mouth rinse  15 mL Mouth Rinse Q2H   pantoprazole  (PROTONIX ) IV  40 mg Intravenous Daily   polycarbophil  625 mg Per Tube Daily   QUEtiapine   25 mg Per Tube QHS   Relizorb  1 Cartridge Does not apply Q0600   Relizorb  2 Cartridge Does not apply Q1400   revefenacin   175 mcg Nebulization Daily   scopolamine   1 patch Transdermal Q72H   sodium chloride  flush  3 mL Intravenous Q12H   spironolactone   25 mg Per Tube Daily   thiamine   100 mg Per Tube QHS   Warfarin - Pharmacist Dosing Inpatient   Does not apply q1600    Infusions:  sodium chloride  20 mL/hr at 12/17/24 1412   cefTRIAXone  (ROCEPHIN )  IV Stopped (12/27/24 0951)   feeding supplement (VITAL 1.5 CAL) 1,000 mL (12/28/24 0501)   feeding supplement (VITAL 1.5 CAL) Stopped (12/28/24 0502)   magnesium  sulfate bolus IVPB 4 g (12/28/24 0610)    PRN Medications: sodium chloride , acetaminophen  (TYLENOL ) oral liquid 160 mg/5 mL, albuterol , LORazepam , morphine  injection, ondansetron  (ZOFRAN ) IV, mouth rinse, oxyCODONE , polyethylene glycol, simethicone , sodium chloride , sodium chloride  flush  Assessment/Plan   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  Valvular Disease  - severe MR posteriorly directed, mod TR. Likely functional.  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 On trach collar 24/7 at this point, capping trials ongoing  Acute Systolic Heart Failure w/ Biventricular Dysfunction >> Cardiogenic Shock - HS trop not c/w ACS but EKG w/ anteroseptal Qs  - CMRI Severe AR/MR. LVEF 35%  - Cath- normal cors, RA5, PA 32/17 (24), PVR 2.57 Papi 3. CO 4.9 CI 3.8 - Post-op Echo 12/02/24:  EF < 20% with severe LVH (new, ?inflammatory), moderately decreased RV function, stable bioprosthetic MV and AoV.  - Impella removed 12/24  - Ltd echo 12/28: EF 40-45%, mildly reduced RV - Euvolemic - K 5.3. Will hold spiro for now. Consider restarting at 12.5 mg daily if K improves  - continue losartan  25 mg daily - Decreased digoxin  to 0.0625 mg daily, dig level 0.9 1/2 - No BP room to titrate further - Stable from a HF standpoint for CIR  Ischemic bowel - S/p exp/lap 12/11. Patchy  ischemia of the distal ileum extending over about 75 cm without perforation. Ischemic segment resected.  Colon was distended with gas but viable. Small bowel left in discontinuity.  - Back to OR 12/15 for ischemic R colon and mesenteric ischemia. S/p exp/lap, wound vac exchange, R colectomy without anastomosis.  - Returned to OR 12/16 for ileostomy creation.  - GSU following. Ileostomy working well, output imrpoving - TPN off. On TF. Now eating solid food. TF on hold with BRBPR - Continue warfarin. Continue to hold heparin  gtt   R hemothorax: resolved - Hemothorax s/p right thoracentesis with intercostal artery injury. Massive bleeding requiring multiple products and development of hemorrhagic shock, now improved. S/p IR embolization.  - S/p VATS 12/18 with right chest washout.  - H/H stable. CT out.  ETOH Abuse - heavy drinker, at least 5 drinks per evening - may be contributing to CM. Did not withdrawal - cessation needed after discharge   DMII  - Hgb A1C 6.1 - On SSI.  ID - BAL w/ enterobacter cloacae and candida albicans. Zosyn  > meropenem  (12/21 to 12/23) - Completed cefepime  (12/23 to 12/26) - Completed micafungin  (12/22 to 12/29) - Restarted CTX on 12/31 for low fevers, secretions. WBC count improving, 7 day course  AKI - Resolved.  VT - Quiescent - continue amio 200 mg bid  Pericardial effusion - Echo 12/26 moderate effusion without tamponade - Limited echo 12/17 with moderate  effusion.  - This was not drained at time of VATS/right hemothorax washout on 12/18. Appeared fibrinous. - Ltd echo 12/28 with increased, large pericardial effusion, no tamponade - continue colchicine  0.3 mg daily - Repeat limited echo 1/2 reviewed, no change  12 Atrial flutter - Prev in/out of AFL with controlled rate.  - Remains in NSR. Continue amio 200 mg bid - Warfarin started 12/30, INR 1.1 still   Continue aggressive PT/OT. Eventual CIR. Stable to go from a HF standpoint   Length of Stay: [address] PA-C  12/28/2024, 7:40 AM  Advanced Heart Failure Team Pager [phone] (M-F; 7a - 5p)   Please visit [email or web address]: For overnight coverage please call cardiology fellow first. If fellow not available call Shock/ECMO MD on call.  For ECMO / Mechanical Support (Impella, IABP, LVAD) issues call Shock / ECMO MD on call.   Patient seen with PA, I formulated the plan and agree with the above note.   He is doing well today, eating lunch.    INR 1.1, on warfarin but no heparin  given bleeding from ostomy.   This has resolved.   Creatinine stable, K mildly elevated.    He is on ceftriaxone  x 7 day course.   Has been walking.   General: NAD Neck: No JVD, no thyromegaly or thyroid nodule. Trach in place.  Lungs: Clear to auscultation bilaterally with normal respiratory effort. CV: Nondisplaced PMI.  Heart regular S1/S2, no S3/S4, no murmur.  No peripheral edema.   Abdomen: Soft, nontender, no hepatosplenomegaly, no distention. Has ostomy.  Skin: Intact without lesions or rashes.  Neurologic: Alert and oriented x 3.  Psych: Normal affect. Extremities: No clubbing or cyanosis.  HEENT: Normal.   Volume status looks ok.  Can continue lower dose digoxin  and continue losartan . Hold spironolactone  with high K.  Start Farxiga 10 mg daily.   Continue warfarin to allow his INR to trend up. No heparin .   He is now eating and off TPN.   Complete 7 days ceftriaxone .    Continue PT/OT.  Ezra Shuck 12/28/2024 11:36 AM  "

## 2024-12-28 NOTE — Progress Notes (Signed)
 PMR Admission Coordinator Pre-Admission Assessment   Patient: Alejandro Lopez is an 44 y.o., male MRN: [id number] DOB: [birth year] Height: 5' 10 (177.8 cm) Weight: 65.1 kg   Insurance Information HMO:     PPO:      PCP:      IPA:      80/20:      OTHER:  PRIMARY: [place] Medicaid [employer]      Policy#: [id number]      Subscriber: pt CM Name: Beverley from [facility]   Phone#: [phone]     Fax#: [phone] Pre-Cert#: [id number]  auth for CIR from 12/28/24-01/04/25 for admit 12/28/24 with next review date 01/03/25.  Updates due to Cacilie at fax listed above        Benefits:  Phone #: [phone]     Eff. Date: 12/24/24 until 09/22/25     Deduct: none      Out of Pocket Max: none      Life Max: none CIR: 100% per medicaid      SNF: 100% per medicaid. First 90 days covered by [facility] then moved to [facility] Outpatient: 100% per medicaid     Co-Pay:  Home Health: 100% per medicaid      Co-Pay:  DME: 100% per medicaid     Co-Pay: limited by medical neccesity Providers: in network      The Data Collection Information Summary for patients in Inpatient Rehabilitation Facilities with attached Privacy Act Statement-Health Care Records was provided and verbally reviewed with: Patient   Emergency Contact Information Contact Information       Name Relation Home Work Mobile    [place] Spouse [phone]        Cancel,[name] [phone]   [phone]         Other Contacts   None on File        Current Medical History  Patient Admitting Diagnosis: Debility, Heart Failure with cardiogenic shock   History of Present Illness: 44 yo male presenting 11/30 to [facility] with swelling of LE, groin, and abdomen. Work up revealed acute HFrEF, severe MR, aortic insufficiency, and transaminates. Transferred to [facility] on 11/26/24. 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 Extubated 12/9. S/p thoracentesis 12/11 with 1000cc's of blood drained from R pleural  space, rapid recollection and pt then s/p R chest tube placed 12/11 S/p coil embolization of right intercostal artery at level of right 10th rib, followed by ex lap with wound vac placement which showed patchy ischemia of ileum extending 75cm. Return to OR 12/14 with R colectomy and 12/16 for ostomy placement. To OR 12/18 for R VATS, washout, and pericardial window. Intubated 12/12-12/19, re-intubated 12/21, trach placed 12/23. Impella removed 12/16/24. Patient de-cannulated 12/28/24. PMH includes: HTN, alcohol use (5 drinks/day), tobacco use, but is limited by limited access to healthcare.    [facility] has been reviewed by the rehabilitation admission coordinator and physician.   Past Medical History      Past Medical History:  Diagnosis Date   Asthma          Has the patient had major surgery during 100 days prior to admission? Yes   Family History   family history includes Diabetes in his mother; Hypertension in his mother; Stroke in his mother.   Current Medications [Current Medications]  [Current Medications]    Current Facility-Administered Medications:    0.9 %  sodium chloride  infusion (Manually program via Guardrails  IV Fluids), , Intravenous, Continuous, Lucas Dorise POUR, MD, Last Rate: 20 mL/hr at 12/17/24 1412, New Bag at 12/17/24 1412   0.9 %  sodium chloride  infusion (Manually program via Guardrails IV Fluids), , Intravenous, PRN, Bartle, Bryan K, MD, Last Rate: 10 mL/hr at 12/07/24 0838, New Bag at 12/07/24 0838   acetaminophen  (TYLENOL ) 160 MG/5ML solution 650 mg, 650 mg, Per Tube, Q6H PRN, Bartle, Bryan K, MD, 650 mg at 12/17/24 0800   albuterol  (PROVENTIL ) (2.5 MG/3ML) 0.083% nebulizer solution 2.5 mg, 2.5 mg, Nebulization, Q4H PRN, Bartle, Bryan K, MD, 2.5 mg at 12/20/24 1958   amiodarone  (PACERONE ) tablet 200 mg, 200 mg, Per Tube, BID, Salam, Savannah B, RPH, 200 mg at 12/27/24 0913   arformoterol  (BROVANA ) nebulizer  solution 15 mcg, 15 mcg, Nebulization, BID, Bartle, Bryan K, MD, 15 mcg at 12/27/24 0755   cefTRIAXone  (ROCEPHIN ) 2 g in sodium chloride  0.9 % 100 mL IVPB, 2 g, Intravenous, Q24H, Gretta Leita SQUIBB, DO, Stopped at 12/27/24 9048   Chlorhexidine  Gluconate Cloth 2 % PADS 6 each, 6 each, Topical, Daily, Lucas Dorise POUR, MD, 6 each at 12/27/24 0915   colchicine  tablet 0.3 mg, 0.3 mg, Per Tube, Daily, Lucas Dorise POUR, MD, 0.3 mg at 12/27/24 1711   [START ON 12/28/2024] cyanocobalamin  (VITAMIN B12) injection 1,000 mcg, 1,000 mcg, Intramuscular, Weekly, Gretta, Laura P, DO   digoxin  (LANOXIN ) tablet 0.0625 mg, 0.0625 mg, Per Tube, Daily, Colletta Manuelita Garre, PA-C, 0.0625 mg at 12/27/24 9086   diphenoxylate -atropine  (LOMOTIL ) 2.5-0.025 MG per tablet 1 tablet, 1 tablet, Per Tube, QID, Simaan, Elizabeth S, PA-C, 1 tablet at 12/27/24 1710   feeding supplement (PROSource TF20) liquid 60 mL, 60 mL, Per Tube, BID, Clark, Laura P, DO, 60 mL at 12/27/24 0914   feeding supplement (VITAL 1.5 CAL) liquid 1,000 mL, 1,000 mL, Per Tube, Continuous, Cyndy Ozell DASEN, RPH, Last Rate: 45 mL/hr at 12/27/24 1700, Infusion Verify at 12/27/24 1700   feeding supplement (VITAL 1.5 CAL) liquid 1,000 mL, 1,000 mL, Per Tube, Continuous, Cyndy Ozell DASEN, RPH, Stopped at 12/27/24 0545   fiber supplement (BANATROL TF) liquid 60 mL, 60 mL, Per Tube, QID, Clark, Laura P, DO, 60 mL at 12/27/24 1707   folic acid  (FOLVITE ) tablet 1 mg, 1 mg, Oral, QHS, Bitonti, Michael T, RPH, 1 mg at 12/26/24 2157   free water  100 mL, 100 mL, Per Tube, Q4H, Clark, Laura P, DO, 100 mL at 12/27/24 1626   Gerhardt's butt cream, , Topical, TID, Bartle, Bryan K, MD, Given at 12/27/24 1626   lidocaine  (LIDODERM ) 5 % 1 patch, 1 patch, Transdermal, Q24H, Bartle, Bryan K, MD, 1 patch at 12/26/24 1800   loperamide  HCl (IMODIUM ) 1 MG/7.5ML suspension 4 mg, 4 mg, Per Tube, Q6H, Simaan, Elizabeth S, PA-C, 4 mg at 12/27/24 1439   LORazepam  (ATIVAN ) injection 1 mg, 1 mg,  Intravenous, Q4H PRN, Bartle, Bryan K, MD, 1 mg at 12/20/24 2124   losartan  (COZAAR ) tablet 25 mg, 25 mg, Per Tube, Daily, Lee, Jordan, NP, 25 mg at 12/27/24 0913   morphine  (PF) 2 MG/ML injection 1 mg, 1 mg, Intravenous, Q3H PRN, Bartle, Bryan K, MD, 1 mg at 12/18/24 0242   multivitamin with minerals tablet 1 tablet, 1 tablet, Per Tube, Daily, Chen, Lydia D, RPH, 1 tablet at 12/27/24 0913   ondansetron  (ZOFRAN ) injection 4 mg, 4 mg, Intravenous, Q6H PRN, Bartle, Bryan K, MD, 4 mg at 12/23/24 1109   Oral care mouth rinse, 15 mL, Mouth Rinse, Q2H,  Lucas Dorise POUR, MD, 15 mL at 12/27/24 1711   Oral care mouth rinse, 15 mL, Mouth Rinse, PRN, Lucas Dorise POUR, MD   oxyCODONE  (Oxy IR/ROXICODONE ) immediate release tablet 5-10 mg, 5-10 mg, Per Tube, Q3H PRN, Bitonti, Michael T, RPH, 5 mg at 12/27/24 1710   pantoprazole  (PROTONIX ) injection 40 mg, 40 mg, Intravenous, Daily, Bartle, Bryan K, MD, 40 mg at 12/27/24 0911   polycarbophil (FIBERCON) tablet 625 mg, 625 mg, Per Tube, Daily, Signe Cree A, MD, 625 mg at 12/27/24 0914   polyethylene glycol (MIRALAX  / GLYCOLAX ) packet 17 g, 17 g, Per Tube, Daily PRN, Harold Scholz, MD   QUEtiapine  (SEROQUEL ) tablet 25 mg, 25 mg, Per Tube, QHS, Bartle, Bryan K, MD, 25 mg at 12/26/24 2156   Relizorb device 1 Cartridge, 1 Cartridge, Does not apply, Q0600, Gretta Leita SQUIBB, DO, 1 Cartridge at 12/27/24 0600   Relizorb device 2 Cartridge, 2 Cartridge, Does not apply, Q1400, Gretta Leita SQUIBB, DO, 2 Cartridge at 12/27/24 1437   revefenacin  (YUPELRI ) nebulizer solution 175 mcg, 175 mcg, Nebulization, Daily, Bartle, Bryan K, MD, 175 mcg at 12/27/24 0755   scopolamine  (TRANSDERM-SCOP) 1 MG/3DAYS 1 mg, 1 patch, Transdermal, Q72H, Gretta Leita SQUIBB, DO, 1 mg at 12/26/24 1146   simethicone  (MYLICON) 40 MG/0.6ML suspension 40 mg, 40 mg, Per Tube, QID PRN, Gretta Leita P, DO, 40 mg at 12/26/24 1342   sodium chloride  0.9 % nebulizer solution 3 mL, 3 mL, Nebulization, Q8H PRN, Bartle,  Dorise POUR, MD   sodium chloride  flush (NS) 0.9 % injection 3 mL, 3 mL, Intravenous, Q12H, Bartle, Dorise POUR, MD, 3 mL at 12/27/24 0915   sodium chloride  flush (NS) 0.9 % injection 3 mL, 3 mL, Intravenous, PRN, Bartle, Bryan K, MD   spironolactone  (ALDACTONE ) tablet 25 mg, 25 mg, Per Tube, Daily, Salam, Savannah B, RPH, 25 mg at 12/27/24 9086   thiamine  (VITAMIN B1) tablet 100 mg, 100 mg, Per Tube, QHS, Lucas Dorise POUR, MD   Warfarin - Pharmacist Dosing Inpatient, , Does not apply, q1600, Sherryll Suzen SQUIBB, RPH, Given at 12/27/24 1626    Patients Current Diet:  Diet Order                  Diet regular Room service appropriate? Yes with Assist; Fluid consistency: Thin  Diet effective now                         Precautions / Restrictions Precautions Precautions: Fall, Sternal Precaution Booklet Issued: Yes (comment) Precaution/Restrictions Comments: Feeding tube, wound vac, ostomy, trach collar Restrictions Weight Bearing Restrictions Per Provider Order: Yes (sternal precautions) RUE Weight Bearing Per Provider Order: Non weight bearing (sternal precautions) LUE Weight Bearing Per Provider Order: Non weight bearing (sternal precautions) Other Position/Activity Restrictions: sternal precautions    Has the patient had 2 or more falls or a fall with injury in the past year? No   Prior Activity Level Community (5-7x/wk): independent, working as lobbyist, driving   Prior Functional Level Self Care: Did the patient need help bathing, dressing, using the toilet or eating? Independent   Indoor Mobility: Did the patient need assistance with walking from room to room (with or without device)? Independent   Stairs: Did the patient need assistance with internal or external stairs (with or without device)? Independent   Functional Cognition: Did the patient need help planning regular tasks such as shopping or remembering to take medications? Independent   Patient Information  Are  you of Hispanic, Latino/a,or Spanish origin?: A. No, not of Hispanic, Latino/a, or Spanish origin What is your race?: B. Black or African American Do you need or want an interpreter to communicate with a doctor or health care staff?: 0. No   Patient's Response To:  Health Literacy and Transportation Is the patient able to respond to health literacy and transportation needs?: Yes Health Literacy - How often do you need to have someone help you when you read instructions, pamphlets, or other written material from your doctor or pharmacy?: Never In the past 12 months, has lack of transportation kept you from medical appointments or from getting medications?: No In the past 12 months, has lack of transportation kept you from meetings, work, or from getting things needed for daily living?: No   Journalist, Newspaper / Equipment Home Assistive Devices/Equipment: None Home Equipment: None   Prior Device Use: Indicate devices/aids used by the patient prior to current illness, exacerbation or injury? None of the above   Current Functional Level Cognition   Orientation Level: Oriented X4    Extremity Assessment (includes Sensation/Coordination)   Upper Extremity Assessment: Generalized weakness (full BUE AROM) RUE Deficits / Details: generalized weakness, able to make gross grasp, thumb up adn demosntrate isolated finger movement. Grip @ 3/5; elbow/ flexion/ext 3/5; shoulder flx 2/5; extension 3/5. ROM/pulling limited with R shoulder due to impella LUE Deficits / Details: similar to R  Lower Extremity Assessment: Defer to PT evaluation     ADLs   Overall ADL's : Needs assistance/impaired Eating/Feeding: Set up Grooming: Set up, Supervision/safety, Sitting Grooming Details (indicate cue type and reason): EOB Upper Body Bathing: Minimal assistance, Sitting Upper Body Bathing Details (indicate cue type and reason): for back Lower Body Bathing: Sit to/from stand, Moderate assistance Lower Body  Bathing Details (indicate cue type and reason): knees down Upper Body Dressing : Sitting, Moderate assistance Lower Body Dressing: Minimal assistance, Sit to/from stand Lower Body Dressing Details (indicate cue type and reason): socks Toilet Transfer: Minimal assistance, Ambulation Toileting- Clothing Manipulation and Hygiene: Maximal assistance Toileting - Clothing Manipulation Details (indicate cue type and reason): ostomy bag Functional mobility during ADLs: Minimal assistance General ADL Comments: able to complete figure four positoinoing for LB ADL; asking questions regarding management of his ostomy     Mobility   Overal bed mobility: Needs Assistance Bed Mobility: Rolling, Sidelying to Sit Rolling: Contact guard assist Sidelying to sit: Min assist Supine to sit: Total assist Sit to sidelying: Mod assist General bed mobility comments: Cues to roll to side then sit up from sideling, minA at trunk, good initiation noted by pt     Transfers   Overall transfer level: Needs assistance Equipment used:  (EVA walker) Transfers: Sit to/from Stand Sit to Stand: Min assist, Contact guard assist Bed to/from chair/wheelchair/BSC transfer type:: Step pivot Step pivot transfers: Min assist  Lateral/Scoot Transfers: Min assist, +2 physical assistance General transfer comment: Cued pt to scoot to edge of chair/bed and rock to gain momentum, bringing nose over toes to facilitate adequate anterior weight shift to power up to stand. Pt displayed good compliance with his sternal precautions throughout. MinA to power up to stand x1 rep then progressed to CGA the other x5 reps     Ambulation / Gait / Stairs / Wheelchair Mobility   Ambulation/Gait Ambulation/Gait assistance: Supervision, Contact guard assist Gait Distance (Feet): 550 Feet Assistive device: Elyn Finder Gait Pattern/deviations: Step-through pattern, Decreased step length - right, Decreased step length -  left, Decreased stride length,  Trunk flexed General Gait Details: Pt demonstrated good carryover of cues to stand upright. He needs cues to pace himself and take standing rest breaks as needed. he continues to ambulate at a reduced speed without dynamic gait challenges. Cued pt to turn his head to challenge his vestibular system while ambulating, but only moved his head slightly, preferring to keep it facing anteriorly. CGA-supervision for safety Gait velocity: reduced Gait velocity interpretation: <1.31 ft/sec, indicative of household ambulator     Posture / Balance Dynamic Sitting Balance Sitting balance - Comments: pt requires some assist for achieving seated balance but then is able to maintain without outside assist. Balance Overall balance assessment: Needs assistance Sitting-balance support: No upper extremity supported, Feet supported Sitting balance-Leahy Scale: Fair Sitting balance - Comments: pt requires some assist for achieving seated balance but then is able to maintain without outside assist. Standing balance support: Bilateral upper extremity supported, During functional activity, Reliant on assistive device for balance, No upper extremity supported Standing balance-Leahy Scale: Fair Standing balance comment: Able to stand statically briefly without UE support, reliant on Eva walker to ambulate     Special considerations/life events  New ileostomy and trach this admission    Previous Home Environment Living Arrangements: Spouse/significant other, Children  Lives With: Spouse (and 3 children) Available Help at Discharge: Family, Available 24 hours/day Type of Home: House Home Layout: One level Home Access: Stairs to enter Entrance Stairs-Rails: None Entrance Stairs-Number of Steps: 1 Bathroom Shower/Tub: Hydrographic surveyor, Buyer, Retail: Yes Home Care Services: No Additional Comments: reading glasses   Discharge Living Setting Plans for Discharge Living  Setting: Patient's home, House, Lives with (comment) (spouse and 3 children) Type of Home at Discharge: House Discharge Home Layout: One level Discharge Home Access: Stairs to enter Entrance Stairs-Rails: None Entrance Stairs-Number of Steps: 1 Discharge Bathroom Shower/Tub: Tub/shower unit, Curtain Discharge Bathroom Toilet: Standard Discharge Bathroom Accessibility: Yes How Accessible: Accessible via walker Does the patient have any problems obtaining your medications?: No   Social/Family/Support Systems Patient Roles: Spouse, Parent Contact Information: spouse Anticipated Caregiver: wife Anticipated Caregiver's Contact Information: see contacts Ability/Limitations of Caregiver: wife is a Lexicographer Availability: 24/7 Discharge Plan Discussed with Primary Caregiver: Yes Is Caregiver In Agreement with Plan?: Yes Does Caregiver/Family have Issues with Lodging/Transportation while Pt is in Rehab?: No   Goals Patient/Family Goal for Rehab: Mod I to supervision with PT, OT and SLP Expected length of stay: ELOS 7 to 10 days Additional Information: New trach this admission Pt/Family Agrees to Admission and willing to participate: Yes Program Orientation Provided & Reviewed with Pt/Caregiver Including Roles  & Responsibilities: Yes   Decrease burden of Care through IP rehab admission: n/a   Possible need for SNF placement upon discharge: not anticipated   Patient Condition: I have reviewed medical records from [facility], spoken with  patient. I met with patient at the bedside for inpatient rehabilitation assessment.  Patient will benefit from ongoing PT, OT, and SLP, can actively participate in 3 hours of therapy a day 5 days of the week, and can make measurable gains during the admission.  Patient will also benefit from the coordinated team approach during an Inpatient Acute Rehabilitation admission.  The patient will receive intensive therapy as well as  Rehabilitation physician, nursing, social worker, and care management interventions.  Due to bladder management, bowel management, safety, skin/wound care, disease management, medication administration, pain management, and patient education the  patient requires 24 hour a day rehabilitation nursing.  The patient is currently min/mod A with mobility and basic ADLs.  Discharge setting and therapy post discharge at home with outpatient is anticipated.  Patient has agreed to participate in the Acute Inpatient Rehabilitation Program and will admit today.   Preadmission Screen Completed By:  Alison Heron Lot, RN MSN 12/27/2024 5:32 PM, updates provided by Tamaya Pun, PT  ______________________________________________________________________   Discussed status with Dr. Babs on 12/28/24 at 14:00 and received approval for admission today.   Admission Coordinator:  Alison Heron Lot, RN MSN, updates provided by Cosmo Platter, PT  time 14:44/Date 12/28/24    Assessment/Plan: Diagnosis: debility Does the need for close, 24 hr/day Medical supervision in concert with the patient's rehab needs make it unreasonable for this patient to be served in a less intensive setting? Yes Co-Morbidities requiring supervision/potential complications: MR, AS, post-op, wound care, ostomy Due to bladder management, bowel management, safety, skin/wound care, disease management, medication administration, pain management, and patient education, does the patient require 24 hr/day rehab nursing? Yes Does the patient require coordinated care of a physician, rehab nurse, PT, OT, and SLP to address physical and functional deficits in the context of the above medical diagnosis(es)? Yes Addressing deficits in the following areas: balance, endurance, locomotion, strength, transferring, bowel/bladder control, bathing, dressing, feeding, grooming, toileting, and psychosocial support, speech/trach Can the patient actively  participate in an intensive therapy program of at least 3 hrs of therapy 5 days a week? Yes The potential for patient to make measurable gains while on inpatient rehab is excellent Anticipated functional outcomes upon discharge from inpatient rehab: modified independent and supervision PT, modified independent and supervision OT, modified independent SLP Estimated rehab length of stay to reach the above functional goals is: 7-10 days Anticipated discharge destination: Home 10. Overall Rehab/Functional Prognosis: excellent     MD Signature: Arthea IVAR Babs, MD, [facility] Physical Medicine & Rehabilitation Medical Director Rehabilitation Services 12/28/2024

## 2024-12-28 NOTE — Progress Notes (Signed)
 "  NAME:  Alejandro Lopez, MRN:  [id number], DOB:  1981-11-29, LOS: 35 ADMISSION DATE:  11/22/2024, CONSULTATION DATE:  12/8 REFERRING MD:  Lucas, CHIEF COMPLAINT:  post cardiac surgery critical care   History of Present Illness:  44 year old male with history of hypertension, alcohol and tobacco abuse, presented to the emergency room on 11/30 with approximately 1 month history of progressive dyspnea accompanied by lower extremity swelling extending up to the level of his scrotum and abdomen. Diagnostic evaluation by echocardiogram showed left ventricular ejection fraction 35 to 40% with grade 3 diastolic dysfunction this was further complicated by severe mitral valve regurgitation and aortic insufficiency because of this he was transferred to [facility] for further evaluation. He underwent cardiac catheterization on 12/4: Right heart hemodynamic parameters showed baseline right atrial pressure 5 mmHg, PA pressure 32/17, pulmonary capillary wedge pressure at 14 estimated Fick 4.9 L/min with cardiac index Fick calculated at 2.59 His PAPi was 3 Left heart cath was negative for coronary artery disease Went to OR 12/8 for MVR and AVR w/ impella insertion. PCCM asked to assist w/ post op care.   OR course EBL: 1735 Received  Products: cryo 92ml, FFP 401 Also received DDAVP , 2200 crystalloid, 250ml albumin   Cell saver 1125 Pump time 4hrs Clamp time 2hrs 50 min  Events: multiple defibrillations intra-op  Intra-op ECHO EF estimated 40%  Pertinent  Medical History  Tobacco abuse, alcohol abuse, hypertension  Significant Hospital Events: Including procedures, antibiotic start and stop dates in addition to other pertinent events   11/30 admitted/. ECHO 35 to 40% with grade 3 diastolic dysfunction this was further complicated by severe mitral valve regurgitation and aortic insufficiency 12/4 left and right heart cath 12/8 AVR and MVR w/ bioprosthetic valves. Arrived on icu w/ Impella 5.5 MCS  at flow 4 lpm and P 7. Received 2 more PLTs, 2 FFP and 1 cryo in first 6 hrs post op for cont blood loss/oozing. Required NE, epi and milrinone . Good flow on IMPELLA but minimal pulsatility  at P7. Placed on Amio gtt for VT 12/9 received 1 unit PRBC over night for hgb down to 7.5, hgb 8 getting second unit of blood. Still on P7 3.5 lPM. Extubated. 12/11 1L thora, later hemorrhagic shock and hemothorax> IR embolization. Also found to have ischemic bowel and underwent ex-lap with partial resection of ileum. 12/15 plan for ostomy tomorrow, transition epi to levophed   12/16 plan back to OR for ostomy placement  12/17 hemodynamically stable off pressors on Milrinone  0.25 , unable to tolerate SBT, plan for CT chest and heparin  gtt, high fevers>micafungin  12/18 VATS for decortication R chest 12/19 extubated 12/20-12/21 abx to meropenem , aspiration/ agitation/ weakness leading to CO2 retention, RV dysfunction and reintubation, L CVC removed; R CVC placed 12/22: Failed SBT d/t wob 12/23: Bedside tracheostomy  12/24 Impella 5 5 was removed in OR, spiked fever with Tmax 101.1, Precedex  stopped, remained off pressors on dobutamine  at 2.5 12/25 tolerated 1 hr weaning on vent, off DBA 12/26 foley & chest tubes out 1/3 and 1/4 -some bleeding from ostomy output therefore heparin  is held. 1/5 - No further bleeding. Warfarin started. Decannulated.  Interim History / Subjective:  Ostomy output is 1.2 L.  No further bleeding.  CT surgery okay with starting warfarin without heparin .  No further surgeries at present time.  Patient's tracheostomy has been in For 3 days now.  Objective    Blood pressure 97/72, pulse 99, temperature 98.1 F (36.7 C), temperature source  Oral, resp. rate (!) 28, height 5' 10 (1.778 m), weight 64.2 kg, SpO2 100%.    FiO2 (%):  [21 %] 21 %   Intake/Output Summary (Last 24 hours) at 12/28/2024 1147 Last data filed at 12/28/2024 0901 Gross per 24 hour  Intake 2117.03 ml  Output 1950 ml   Net 167.03 ml   Filed Weights   12/26/24 0619 12/27/24 0500 12/28/24 0500  Weight: 65.5 kg 65.1 kg 64.2 kg     Physical exam: General: Chronically ill-appearing male, lying on the bed HEENT: [place]/AT, eyes anicteric.  moist mucus membranes.  Tracheostomy red capped. Neuro: Alert, awake following commands Chest: Coarse breath sounds, no wheezes or rhonchi Heart: Regular rate and rhythm, no murmurs or gallops Abdomen: Soft, nontender, nondistended, bowel sounds present, ostomy no blood.  Patient Lines/Drains/Airways Status     Active Line/Drains/Airways     Name Placement date Placement time Site Days   Peripheral IV 12/22/24 22 G 1.75 Anterior;Left;Lateral Forearm 12/22/24  0800  Forearm  6   Peripheral IV 12/24/24 22 G 1.75 Anterior;Right Forearm 12/24/24  1203  Forearm  4   Ileostomy Standard (end) RLQ 12/08/24  1352  RLQ  20   Small Bore Feeding Tube 10 Fr. Left nare Marking at nare/corner of mouth 63 cm 12/18/24  1105  Left nare  10   Tracheostomy Shiley Flexible 6 mm Cuffed 12/15/24  1143  6 mm  13   Wound 11/30/24 1512 Surgical Closed Surgical Incision Chest Other (Comment) 11/30/24  1512  Chest  28   Wound 11/30/24 1512 Surgical Closed Surgical Incision Chest Right 11/30/24  1512  Chest  28   Wound 12/05/24 1000 Pressure Injury Buttocks Mid Deep Tissue Pressure Injury - Purple or maroon localized area of discolored intact skin or blood-filled blister due to damage of underlying soft tissue from pressure and/or shear. 12/05/24  1000  Buttocks  23   Wound 12/05/24 1033  Back Left;Upper 12/05/24  1033  Back  23   Wound 12/08/24 1408 Surgical Closed Surgical Incision Abdomen Other (Comment) 12/08/24  1408  Abdomen  20            Resolved problem list  AKI Lactic acidosis, resolved Constipation Hyponatremia Right-sided hemothorax status post IR embolization 12/11, status post VATS 12/18 Acute septic encephalopathy, resolved Postop ileus Shock liver, mild transaminase  elevation from TPN Hypertriglyceridemia due to propofol  infusion, now TPN  Assessment and Plan  Acute biventricular HFrEF with cardiogenic shock status post Impella 5.5, removed on 12/24 Severe mitral regurgitation and aortic insufficiency status post bioprosthetic mitral valve and aortic valve replacement; valves functioning well Nonsustained VT, no more episodes Moderate posterior/ lateral pericardial effusion> not amenable to drainage -has been stable off inotropes and MCS -con't GDMT - dig, spiro, losartan  -No need for diuretics while ostomy output remains high; longer term risk for him is dehydration -warfarin for 3 months, heparin  bridge. Pharmacy to dose both.-Anticoagulation held because of blood in ostomy output. -con't amiodarone  200mg  BID -colchicine  -tele monitoring  Acute respiratory failure with hypoxia and hypercapnia status post tracheostomy -trach care per protocol -  Tracheostomy capped for 3 days. Decannulate today -complete 7 days of ceftriaxone ; last day 12/29/24  Sepsis with septic shock due to recurrent aspiration pneumonia -Enterobacter Leukocyosis; slowly uptrending. UA reassurring, CXR unchanged. Pericardial fluid collection does not seem remarkably different on echo. -ceftriaxone  x 7 days  Ischemic bowel s/p laparotomy with partial ileum resection end-ileostomy  -Con't TF at current rates due to difficulty tolerating  due to cramping. During the day decrease by 30% from goal and increase nightly TF by 30%.  -Encouraging him to eat and drink -con't fiber, banatrol, imodium , loperamide ; meeting goal for ostomy output ~1L -con't relizorb to help with lipid absorption- 1 cartridge during the day, 2 at night for higher TF flow rates. Anticipate he would benefit from Creon for short gut once off TF -FWF; need to titrate based on I/O -appreciate surgery's management -Anticipate he will require vitamin B12 injections.  Left closely monitor fat-soluble vitamin  levels.  Hypertriglyceridemia due to propofol  infusion, now TPN  H/o alcohol abuse -vitamins, recommend total ETOH avoidance  Anemia of critical illness and previous ABLA from hemothorax, operative blood loss -transfuse for Hb <7 or hemodynamically significant bleeding  Agitation> situational due to prolonged illness> better Debility -CIR consulted -PT, OT, SLP -OOB mobility; has had trouble with cramping  Hyponatremia -need to ensure drinking fluids with electrolytes due to high fluid needs    DVT: Warfarin  Lines:  PIVs   sodium chloride  20 mL/hr at 12/17/24 1412   cefTRIAXone  (ROCEPHIN )  IV 2 g (12/28/24 0952)   feeding supplement (VITAL 1.5 CAL) 45 mL/hr at 12/28/24 0901   feeding supplement (VITAL 1.5 CAL) Stopped (12/28/24 0502)     Tamela Stakes, MD  Attending Physician, Critical Care Medicine [facility] See Amion for pager If no response to pager, please call [phone] until 7pm After 7pm, Please call E-link [phone]  "

## 2024-12-28 NOTE — Progress Notes (Addendum)
 ANTICOAGULATION CONSULT NOTE  Pharmacy Consult for warfarin Indication: bA/MVR, new AFL  Allergies[1]  Patient Measurements: Height: 5' 10 (177.8 cm) Weight: 64.2 kg (141 lb 8.6 oz) IBW/kg (Calculated) : 73 Heparin  Dosing Weight: 70 kg   Vital Signs: Temp: 98.9 F (37.2 C) (01/05 0720) Temp Source: Oral (01/05 0720) BP: 95/63 (01/05 0520) Pulse Rate: 107 (01/05 0520)  Labs: Recent Labs    12/25/24 0953 12/26/24 0046 12/26/24 0046 12/26/24 1158 12/27/24 0126 12/28/24 0119  HGB  --  8.3*   < > 8.2* 7.9* 8.3*  HCT  --  25.4*   < > 24.8* 24.1* 25.4*  PLT  --  487*  --   --  428* 424*  LABPROT  --  14.8  --   --  14.5 14.5  INR  --  1.1  --   --  1.1 1.1  HEPARINUNFRC 0.36 0.42  --   --   --   --   CREATININE  --  0.50*  --   --  0.55* 0.58*   < > = values in this interval not displayed.    Estimated Creatinine Clearance: 105.9 mL/min (A) (by C-G formula based on SCr of 0.58 mg/dL (L)).  Assessment: 44 yo male presents s/p Impella-assisted MVR/AVR 12/8 with brief VT and ongoing ectopy on inotropes.  Postop course complicated by ischemic bowel s/p exlap 12/11 with partial small bowel resection, abdomen left open.  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 Impella 5.5 removed 12/24.  Per Dr. Lucas, plan for 3 months of OAC with dual valve replacement. Not on anticoagulation prior to admission.  Pharmacy consulted for heparin  and warfarin dosing.  12/28/24:  INR 1.1 (subtherapeutic), remains unchanged from baseline after 6 doses of warfarin.  Heparin  infusion paused 1/3 AM after BRBPR; another BRBM 1/4 PM per RN.  No plans to restart heparin  infusion per Dr. Lucas - plan to let INR trend up. Notable DDIs - TPN off since 12/29, TF ongoing (increased nocturnal feeds 12/31).  S/p CRO x7d (last dose 1/5), PO amiodarone  BID  Goal of Therapy:  INR 2-3 Monitor platelets by anticoagulation  protocol: Yes   Plan:  Warfarin 10 mg x1 today  Daily INR, CBC, and s/sx of bleeding F/u TF plans  Thank you for allowing pharmacy to participate in this patient's care,  Maurilio Fila, PharmD Clinical Pharmacist 12/28/2024  7:23 AM     [1] No Known Allergies

## 2024-12-28 NOTE — Progress Notes (Signed)
 12 Days Post-Op   Subjective/Chief Complaint: States no clots from rectum Asking for DHT out   Objective: Vital signs in last 24 hours: Temp:  [97.5 F (36.4 C)-99.7 F (37.6 C)] 98.9 F (37.2 C) (01/05 0720) Pulse Rate:  [97-113] 107 (01/05 0520) Resp:  [19-39] 21 (01/05 0520) BP: (90-112)/(63-81) 95/63 (01/05 0520) SpO2:  [95 %-100 %] 98 % (01/05 0520) FiO2 (%):  [21 %] 21 % (01/04 1433) Weight:  [64.2 kg] 64.2 kg (01/05 0500) Last BM Date : 12/28/24  Intake/Output from previous day: 01/04 0701 - 01/05 0700 In: 2299.8 [P.O.:480; WH/HU:8330.1; IV Piggyback:150] Out: 2670 [Urine:1420; Stool:1250] Intake/Output this shift: No intake/output data recorded.  PE: Guaze in place and wound c/d/i, ostomy patent,   Lab Results:  Recent Labs    12/27/24 0126 12/28/24 0119  WBC 11.2* 11.8*  HGB 7.9* 8.3*  HCT 24.1* 25.4*  PLT 428* 424*   BMET Recent Labs    12/27/24 0126 12/28/24 0119  NA 129* 129*  K 4.9 5.3*  CL 98 97*  CO2 21* 20*  GLUCOSE 127* 141*  BUN 17 17  CREATININE 0.55* 0.58*  CALCIUM  8.8* 9.0   PT/INR Recent Labs    12/27/24 0126 12/28/24 0119  LABPROT 14.5 14.5  INR 1.1 1.1   ABG No results for input(s): PHART, HCO3 in the last 72 hours.  Invalid input(s): PCO2, PO2  Studies/Results: No results found.  Anti-infectives: Anti-infectives (From admission, onward)    Start     Dose/Rate Route Frequency Ordered Stop   12/23/24 1100  vancomycin  (VANCOREADY) IVPB 1250 mg/250 mL  Status:  Discontinued        1,250 mg 166.7 mL/hr over 90 Minutes Intravenous Every 12 hours 12/23/24 0928 12/24/24 1257   12/23/24 0900  cefTRIAXone  (ROCEPHIN ) 2 g in sodium chloride  0.9 % 100 mL IVPB        2 g 200 mL/hr over 30 Minutes Intravenous Every 24 hours 12/23/24 0806 12/29/24 2359   12/23/24 0830  piperacillin -tazobactam (ZOSYN ) IVPB 3.375 g  Status:  Discontinued        3.375 g 12.5 mL/hr over 240 Minutes Intravenous Every 8 hours 12/23/24 0742  12/23/24 0806   12/15/24 1400  ceFEPIme  (MAXIPIME ) 2 g in sodium chloride  0.9 % 100 mL IVPB        2 g 200 mL/hr over 30 Minutes Intravenous Every 8 hours 12/15/24 1256 12/18/24 2258   12/14/24 1830  micafungin  (MYCAMINE ) 200 mg in sodium chloride  0.9 % 100 mL IVPB  Status:  Discontinued        200 mg 110 mL/hr over 1 Hours Intravenous  Once 12/14/24 1738 12/14/24 1743   12/14/24 1830  micafungin  (MYCAMINE ) 100 mg in sodium chloride  0.9 % 100 mL IVPB        100 mg 105 mL/hr over 1 Hours Intravenous Every 24 hours 12/14/24 1738 12/20/24 1922   12/13/24 2000  vancomycin  (VANCOREADY) IVPB 1500 mg/300 mL  Status:  Discontinued        1,500 mg 150 mL/hr over 120 Minutes Intravenous Every 24 hours 12/13/24 1237 12/14/24 1418   12/13/24 0720  vancomycin  variable dose per unstable renal function (pharmacist dosing)  Status:  Discontinued         Does not apply See admin instructions 12/13/24 0720 12/13/24 1237   12/12/24 1630  meropenem  (MERREM ) 1 g in sodium chloride  0.9 % 100 mL IVPB  Status:  Discontinued        1 g 200  mL/hr over 30 Minutes Intravenous Every 8 hours 12/12/24 1534 12/15/24 1256   12/10/24 1000  micafungin  (MYCAMINE ) 100 mg in sodium chloride  0.9 % 100 mL IVPB  Status:  Discontinued       Placed in Followed by Linked Group   100 mg 105 mL/hr over 1 Hours Intravenous Every 24 hours 12/09/24 0704 12/11/24 0710   12/09/24 0830  micafungin  (MYCAMINE ) 200 mg in sodium chloride  0.9 % 100 mL IVPB       Placed in Followed by Linked Group   200 mg 110 mL/hr over 1 Hours Intravenous  Once 12/09/24 0704 12/09/24 1101   12/06/24 2200  vancomycin  (VANCOREADY) IVPB 1250 mg/250 mL  Status:  Discontinued        1,250 mg 166.7 mL/hr over 90 Minutes Intravenous Every 12 hours 12/06/24 0811 12/13/24 0709   12/06/24 0900  vancomycin  (VANCOREADY) IVPB 1750 mg/350 mL        1,750 mg 175 mL/hr over 120 Minutes Intravenous  Once 12/06/24 0802 12/06/24 1032   12/04/24 1100  vancomycin   (VANCOCIN ) IVPB 1000 mg/200 mL premix  Status:  Discontinued        1,000 mg 200 mL/hr over 60 Minutes Intravenous Every 12 hours 12/03/24 2238 12/03/24 2242   12/04/24 0000  vancomycin  (VANCOREADY) IVPB 1500 mg/300 mL  Status:  Discontinued        1,500 mg 150 mL/hr over 120 Minutes Intravenous  Once 12/03/24 2236 12/03/24 2242   12/03/24 2300  piperacillin -tazobactam (ZOSYN ) IVPB 3.375 g  Status:  Discontinued        3.375 g 12.5 mL/hr over 240 Minutes Intravenous Every 8 hours 12/03/24 2236 12/12/24 1534   11/30/24 2130  vancomycin  (VANCOCIN ) IVPB 1000 mg/200 mL premix        1,000 mg 200 mL/hr over 60 Minutes Intravenous  Once 11/30/24 1609 11/30/24 2351   11/30/24 1615  ceFAZolin  (ANCEF ) IVPB 2g/100 mL premix        2 g 200 mL/hr over 30 Minutes Intravenous Every 8 hours 11/30/24 1609 12/02/24 0546   11/30/24 0400  vancomycin  (VANCOREADY) IVPB 1250 mg/250 mL        1,250 mg 166.7 mL/hr over 90 Minutes Intravenous To Surgery 11/29/24 1040 11/30/24 0856   11/30/24 0400  ceFAZolin  (ANCEF ) IVPB 2g/100 mL premix        2 g 200 mL/hr over 30 Minutes Intravenous To Surgery 11/29/24 1040 11/30/24 1239   11/30/24 0400  ceFAZolin  (ANCEF ) IVPB 2g/100 mL premix  Status:  Discontinued        2 g 200 mL/hr over 30 Minutes Intravenous To Surgery 11/29/24 1036 11/30/24 1609       Assessment/Plan: 44 y/o M S/P AVR, MCR, Impella placement by Dr. Lucas 12/8   S/P right thoracentesis with hemorrhage from intercostal -chest tube placed by TCTS.  S/p emergent IR embolizaton  R intercostal artery by Dr. Philip.    S/P VATS 12/18    Pneumatosis of the pelvic small bowel and R colon with portal venous gas on CT  POD 21/19/17 status post ex lap with SBR & VAC placement, repeat laparotomy with patchy small bowel ischemia, end ileostomy/ab closure - WBC 11 and stable - ostomy output 1.25L.  On banatrol TID, imodium  from 4 mg q 6h, lomotil  2 mg QID. CCM added relizorb yesterday. Continue current  regimen.  - woc following  - Hb stable despite clots from rectum. Ostomy output appears normal. Will defer to primary team regarding anticoagulation.  -  Cal count ongoing, plans to DC trach which should help with swallowing   FEN: Regular, TF as above, cal count ID: cefepime , micafungin  completed; resp cx 12/18 with few C albican, BCx 12/17 neg, BAL 12/23 rare yeast; given WBC and some resp bacteria on gram stain, CCM starting 7 d abx (Rocephin ) on 12/30 VTE: coumadin   Foley: removed, spont voids Dispo: ICU, possible CIR soon, stable for D/C to CIR from CCS standpoint, we can follow ostomy and nutrition while there.  LOS: 35 days    Lynda Leos 12/28/2024

## 2024-12-28 NOTE — Progress Notes (Signed)
 Speech Language Pathology Treatment: Dysphagia  Patient Details Name: Alejandro Lopez MRN: [id number] DOB: May 07, 1981 Today's Date: 12/28/2024 Time: 8544-8495 SLP Time Calculation (min) (ACUTE ONLY): 9 min  Assessment / Plan / Recommendation Clinical Impression  Pt was decannulated today. Voice sounds great with no emission from stoma. He was advanced to a regular diet, thin liquids on Friday - tolerating very well with no concerns for aspiration per observation today. Dysphagia has resolved.  Cortrak remains while calorie count is underway.  No further SLP for swallowing needed. Pt very pleased to go to CIR soon.      HPI HPI: The pt is a 44 yo male presenting 11/30 with swelling of LE, groin, and abdomen. Work up revealed acute HFrEF, severe MR, aortic insufficiency, and transaminates. 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 Extubated 12/9. S/p thoracentesis 12/11 with 1000cc's of blood drained from R pleural space, rapid recollection and pt then s/p R chest tube placed 12/11 with 2.5L bloody output. S/p  coil embolization of right intercostal artery at level of right 10th rib, followed by ex lap with wound vac placement which showed patchy ischemia of ileum extending 75cm. Return to OR 12/14 with R colectomy and 12/16 for ostomy placement. To OR 12/18 for R VATS, washout, and pericardial window. Intubated 12/12-12/19, re-intubated 12/21. Trach 12/23. PMH includes: HTN, alcohol use (5 drinks/day), tobacco use, but is limited by limited access to healthcare.      SLP Plan  All goals met        Swallow Evaluation Recommendations   Recommendations: PO diet PO Diet Recommendation: Regular;Thin liquids (Level 0) Liquid Administration via: Cup;Straw Medication Administration: Whole meds with liquid Oral care recommendations: Oral care BID (2x/day)     Recommendations                           Dysphagia, pharyngeal phase (R13.13)     All goals met   Aleksa Collinsworth  L. Vona, MA CCC/SLP Clinical Specialist - Acute Care SLP Acute Rehabilitation Services Office number [phone]   Vona Palma Laurice  12/28/2024, 3:07 PM

## 2024-12-28 NOTE — TOC Progression Note (Addendum)
 Transition of Care [facility]) - Progression Note    Patient Details  Name: Alejandro Lopez DOBIE MRN: [id number] Date of Birth: [birth year]  Transition of Care [facility]) CM/SW Contact  Arlana JINNY Nicholaus [place]RA[place] Phone Number: [phone] 12/28/2024, 12:11 PM  Clinical Narrative:   HF CSW reviewed patients chart for discharge readiness, patient not medically stable for d/c. CIR still following, waiting for auth. ICM will continue to follow.   HF CSW/CM will continue to follow and monitor for dc readiness.     Expected Discharge Plan: IP Rehab Facility Barriers to Discharge: Continued Medical Work up               Expected Discharge Plan and Services In-house Referral: Clinical Social Work Discharge Planning Services: CM Consult Post Acute Care Choice: IP Rehab Living arrangements for the past 2 months: Single Family Home                                       Social Drivers of Health (SDOH) Interventions SDOH Screenings   Food Insecurity: No Food Insecurity (11/22/2024)  Housing: Low Risk (11/22/2024)  Transportation Needs: No Transportation Needs (11/22/2024)  Utilities: Not At Risk (11/22/2024)  Social Connections: Unknown (05/06/2022)   Received from [facility]  Tobacco Use: High Risk (12/06/2024)    Readmission Risk Interventions     No data to display

## 2024-12-28 NOTE — Consult Note (Addendum)
 WOC team consulted for Wound Vac and ostomy management. Followed by [facility] during inpatient stay for above (see last note 12/26/2023) with plans for next visit Tuesday 12/30/2023 (NPWT changed Tuesdays/Fridays)   WOC will follow as above.   Thank you,    Powell Bar MSN, RN-BC, [employer]

## 2024-12-28 NOTE — Plan of Care (Signed)
" °  Problem: Clinical Measurements: Goal: Ability to maintain clinical measurements within normal limits will improve Outcome: Progressing Goal: Diagnostic test results will improve Outcome: Progressing Goal: Respiratory complications will improve Outcome: Progressing Goal: Cardiovascular complication will be avoided Outcome: Progressing   Problem: Clinical Measurements: Goal: Diagnostic test results will improve Outcome: Progressing   Problem: Clinical Measurements: Goal: Respiratory complications will improve Outcome: Progressing   Problem: Clinical Measurements: Goal: Cardiovascular complication will be avoided Outcome: Progressing   Problem: Nutrition: Goal: Adequate nutrition will be maintained Outcome: Progressing   Problem: Coping: Goal: Level of anxiety will decrease Outcome: Progressing   Problem: Pain Managment: Goal: General experience of comfort will improve and/or be controlled Outcome: Progressing   "

## 2024-12-28 NOTE — Telephone Encounter (Signed)
 Called to confirm/remind patient of their appointment at [facility] on 12/30/23 9:30.   Patient is currently the hospital.

## 2024-12-28 NOTE — Progress Notes (Addendum)
 Inpatient Rehab Admissions Coordinator:   Addendum: Received approval for CIR admission. Await medical clearance for admission.    Met with patient at bedside. We continue to wait for insurance approval for CIR admission. Will continue to follow.    Rehab Admissons Coordinator Patty Leitzke, [place], [place] [phone]

## 2024-12-29 ENCOUNTER — Ambulatory Visit (HOSPITAL_COMMUNITY)

## 2024-12-29 DIAGNOSIS — Z932 Ileostomy status: Secondary | ICD-10-CM | POA: Diagnosis not present

## 2024-12-29 DIAGNOSIS — I5021 Acute systolic (congestive) heart failure: Secondary | ICD-10-CM

## 2024-12-29 DIAGNOSIS — E875 Hyperkalemia: Secondary | ICD-10-CM | POA: Diagnosis not present

## 2024-12-29 DIAGNOSIS — E871 Hypo-osmolality and hyponatremia: Secondary | ICD-10-CM

## 2024-12-29 DIAGNOSIS — R5381 Other malaise: Secondary | ICD-10-CM | POA: Diagnosis not present

## 2024-12-29 LAB — COMPREHENSIVE METABOLIC PANEL WITH GFR
ALT: 116 U/L — ABNORMAL HIGH (ref 0–44)
AST: 50 U/L — ABNORMAL HIGH (ref 15–41)
Albumin: 2.9 g/dL — ABNORMAL LOW (ref 3.5–5.0)
Alkaline Phosphatase: 350 U/L — ABNORMAL HIGH (ref 38–126)
Anion gap: 11 (ref 5–15)
BUN: 22 mg/dL — ABNORMAL HIGH (ref 6–20)
CO2: 22 mmol/L (ref 22–32)
Calcium: 9.4 mg/dL (ref 8.9–10.3)
Chloride: 93 mmol/L — ABNORMAL LOW (ref 98–111)
Creatinine, Ser: 0.72 mg/dL (ref 0.61–1.24)
GFR, Estimated: >60 mL/min
Glucose, Bld: 115 mg/dL — ABNORMAL HIGH (ref 70–99)
Potassium: 5.2 mmol/L — ABNORMAL HIGH (ref 3.5–5.1)
Sodium: 125 mmol/L — ABNORMAL LOW (ref 135–145)
Total Bilirubin: 0.8 mg/dL (ref 0.0–1.2)
Total Protein: 7.9 g/dL (ref 6.5–8.1)

## 2024-12-29 LAB — CBC WITH DIFFERENTIAL/PLATELET
Abs Immature Granulocytes: 0.19 K/uL — ABNORMAL HIGH (ref 0.00–0.07)
Basophils Absolute: 0.1 K/uL (ref 0.0–0.1)
Basophils Relative: 1 %
Eosinophils Absolute: 0.4 K/uL (ref 0.0–0.5)
Eosinophils Relative: 4 %
HCT: 24.5 % — ABNORMAL LOW (ref 39.0–52.0)
Hemoglobin: 8.2 g/dL — ABNORMAL LOW (ref 13.0–17.0)
Immature Granulocytes: 2 %
Lymphocytes Relative: 30 %
Lymphs Abs: 3.3 K/uL (ref 0.7–4.0)
MCH: 29.6 pg (ref 26.0–34.0)
MCHC: 33.5 g/dL (ref 30.0–36.0)
MCV: 88.4 fL (ref 80.0–100.0)
Monocytes Absolute: 2.5 K/uL — ABNORMAL HIGH (ref 0.1–1.0)
Monocytes Relative: 22 %
Neutro Abs: 4.7 K/uL (ref 1.7–7.7)
Neutrophils Relative %: 41 %
Platelets: 421 K/uL — ABNORMAL HIGH (ref 150–400)
RBC: 2.77 MIL/uL — ABNORMAL LOW (ref 4.22–5.81)
RDW: 17 % — ABNORMAL HIGH (ref 11.5–15.5)
WBC: 11.2 K/uL — ABNORMAL HIGH (ref 4.0–10.5)
nRBC: 0.4 % — ABNORMAL HIGH (ref 0.0–0.2)

## 2024-12-29 LAB — PROTIME-INR
INR: 1.2 (ref 0.8–1.2)
Prothrombin Time: 15.6 s — ABNORMAL HIGH (ref 11.4–15.2)

## 2024-12-29 LAB — LACTIC ACID, PLASMA
Lactic Acid, Venous: 1.5 mmol/L (ref 0.5–1.9)
Lactic Acid, Venous: 1.1 mmol/L (ref 0.5–1.9)

## 2024-12-29 LAB — MAGNESIUM: Magnesium: 2 mg/dL (ref 1.7–2.4)

## 2024-12-29 LAB — PRO BRAIN NATRIURETIC PEPTIDE: Pro Brain Natriuretic Peptide: 282 pg/mL

## 2024-12-29 LAB — OSMOLALITY: Osmolality: 281 mosm/kg (ref 275–295)

## 2024-12-29 LAB — GLUCOSE, CAPILLARY: Glucose-Capillary: 124 mg/dL — ABNORMAL HIGH (ref 70–99)

## 2024-12-29 MED ORDER — WARFARIN SODIUM 10 MG PO TABS
10.0000 mg | ORAL_TABLET | Freq: Once | ORAL | Status: AC
  Administered 2024-12-29: 10 mg via ORAL
  Filled 2024-12-29: qty 1

## 2024-12-29 MED ORDER — VITAL 1.5 CAL PO LIQD
1000.0000 mL | ORAL | Status: DC
  Administered 2024-12-29: 1000 mL
  Filled 2024-12-29 (×2): qty 1000

## 2024-12-29 MED ORDER — RELIZORB DEVI: 2.0000 | Freq: Every day | Status: DC

## 2024-12-29 MED ORDER — SODIUM ZIRCONIUM CYCLOSILICATE 5 G PO PACK
5.0000 g | PACK | Freq: Once | ORAL | Status: AC
  Administered 2024-12-29: 5 g via ORAL
  Filled 2024-12-29: qty 1

## 2024-12-29 MED ORDER — RELIZORB DEVI
2.0000 | Freq: Every day | Status: DC
  Administered 2024-12-29: 2

## 2024-12-29 MED ORDER — FREE WATER
50.0000 mL | Status: DC
  Administered 2024-12-29 – 2024-12-30 (×7): 50 mL

## 2024-12-29 MED ORDER — VITAL 1.5 CAL PO LIQD: 1000.0000 mL | ORAL | Status: DC

## 2024-12-29 MED ORDER — ADULT MULTIVITAMIN LIQUID CH
15.0000 mL | Freq: Every day | ORAL | Status: DC
  Administered 2024-12-30: 15 mL
  Filled 2024-12-29: qty 15

## 2024-12-29 MED ORDER — SODIUM CHLORIDE 0.9 % IV SOLN: INTRAVENOUS | Status: AC | PRN

## 2024-12-29 NOTE — Progress Notes (Signed)
 Inpatient Rehabilitation  Patient information reviewed and entered into eRehab system by [facility]. Karen Kays., CCC/SLP, PPS Coordinator.  Information including medical coding, functional ability and quality indicators will be reviewed and updated through discharge.

## 2024-12-29 NOTE — Evaluation (Signed)
 Physical Therapy Assessment and Plan  Patient Details  Name: Alejandro Lopez MRN: [id number] Date of Birth: 12-10-1981  PT Diagnosis: Difficulty walking and Muscle weakness Rehab Potential: Good ELOS: 7-10 days   Today's Date: 12/29/2024 PT Individual Time: 1300-1415 PT Individual Time Calculation (min): 75 min    Hospital Problem: Principal Problem:   Debility   Past Medical History:  Past Medical History:  Diagnosis Date   Asthma    Past Surgical History:  Past Surgical History:  Procedure Laterality Date   AORTIC VALVE REPLACEMENT N/A 11/30/2024   Procedure: REPLACEMENT, AORTIC VALVE, OPEN WITH INSPIRIS RESILIA AORTIC VALVE 23mm;  Surgeon: Lucas Dorise POUR, MD;  Location: [facility] OR;  Service: Open Heart Surgery;  Laterality: N/A;   BOWEL RESECTION N/A 12/08/2024   Procedure: EXCISION, SMALL INTESTINE;  Surgeon: Ebbie Cough, MD;  Location: [facility] OR;  Service: General;  Laterality: N/A;   COLOSTOMY REVISION Right 12/06/2024   Procedure: COLECTOMY, RIGHT;  Surgeon: Tanda Locus, MD;  Location: [facility] OR;  Service: General;  Laterality: Right;   FRACTURE SURGERY     ILEOSTOMY N/A 12/08/2024   Procedure: CREATION, ILEOSTOMY END;  Surgeon: Ebbie Cough, MD;  Location: [facility] OR;  Service: General;  Laterality: N/A;   INTRAOPERATIVE TRANSESOPHAGEAL ECHOCARDIOGRAM N/A 11/30/2024   Procedure: ECHOCARDIOGRAM, TRANSESOPHAGEAL, INTRAOPERATIVE;  Surgeon: Lucas Dorise POUR, MD;  Location: [facility] OR;  Service: Open Heart Surgery;  Laterality: N/A;   IR ANGIOGRAM FOLLOW UP STUDY  12/04/2024   IR ANGIOGRAM SELECTIVE EACH ADDITIONAL VESSEL  12/04/2024   IR ANGIOGRAM VISCERAL SELECTIVE  12/04/2024   IR EMBO ART  VEN HEMORR LYMPH EXTRAV  INC GUIDE ROADMAPPING  12/04/2024   IR US  GUIDE VASC ACCESS RIGHT  12/04/2024   LAPAROTOMY N/A 12/03/2024   Procedure: LAPAROTOMY, EXPLORATORY small bowel resection abthrea therapy;  Surgeon: Sebastian Moles, MD;  Location: [facility] OR;  Service: General;  Laterality: N/A;   possible bowel resection   LAPAROTOMY N/A 12/06/2024   Procedure: LAPAROTOMY, EXPLORATORY; WOUND VAC CHANGE;  Surgeon: Tanda Locus, MD;  Location: [facility] OR;  Service: General;  Laterality: N/A;   LAPAROTOMY N/A 12/08/2024   Procedure: MACARIO LIVINGS;  Surgeon: Ebbie Cough, MD;  Location: [facility] OR;  Service: General;  Laterality: N/A;   MITRAL VALVE REPLACEMENT N/A 11/30/2024   Procedure: REPLACEMENT, MITRAL VALVE WITH MITRIS RESILIA MITRAL VALVE 27mm;  Surgeon: Lucas Dorise POUR, MD;  Location: [facility] OR;  Service: Open Heart Surgery;  Laterality: N/A;   PLACEMENT OF IMPELLA LEFT VENTRICULAR ASSIST DEVICE N/A 11/30/2024   Procedure: INSERTION, CARDIAC ASSIST DEVICE, IMPELLA;  Surgeon: Lucas Dorise POUR, MD;  Location: [facility] OR;  Service: Open Heart Surgery;  Laterality: N/A;  IMPELLA 5.5 INSERTION   REMOVAL OF IMPELLA LEFT VENTRICULAR ASSIST DEVICE Right 12/16/2024   Procedure: REMOVAL, CARDIAC ASSIST DEVICE, IMPELLA;  Surgeon: Lucas Dorise POUR, MD;  Location: [facility] OR;  Service: Open Heart Surgery;  Laterality: Right;   RIGHT HEART CATH AND CORONARY ANGIOGRAPHY N/A 11/27/2024   Procedure: RIGHT HEART CATH AND CORONARY ANGIOGRAPHY;  Surgeon: Zenaida Morene PARAS, MD;  Location: [facility] INVASIVE CV LAB;  Service: Cardiovascular;  Laterality: N/A;   TRANSESOPHAGEAL ECHOCARDIOGRAM (CATH LAB) N/A 11/27/2024   Procedure: TRANSESOPHAGEAL ECHOCARDIOGRAM;  Surgeon: Zenaida Morene PARAS, MD;  Location: [facility] INVASIVE CV LAB;  Service: Cardiovascular;  Laterality: N/A;   VIDEO ASSISTED THORACOSCOPY (VATS)/DECORTICATION Right 12/10/2024   Procedure: VIDEO ASSISTED THORACOSCOPY (VATS)/DECORTICATION;  Surgeon: Shyrl Linnie KIDD, MD;  Location: [facility] OR;  Service: Thoracic;  Laterality: Right;  Assessment & Plan Clinical Impression: Patient is a 44 y.o. year old male with PMHx of mild hypertension alcohol abuse and asthma who presented to [facility] ER on 11/22/24 with complaints of increased swelling to bilateral lower  extremities, groin, and abdomen with reports of a fullness sensation in his lower back. He had recently completed a course of Zithromax  for community acquired pneumonia, shortness of breath and nonproductive cough remained.  In the ER patient was hemodynamically stable with oxygen  saturation 97-99% room air.  WBC 10.6, hemoglobin 15.1, platelets 206.  AST 71, ALT 100, alk phosphatase 148, total bilirubin 1.3.  Sodium 141, potassium 3.8, bicarbonate 28, serum creatinine 0.89.  TSH 1.860.  UA was negative for pyuria.  proBNP 3354.  Troponin 27>> 27.  Chest x-ray was negative for edema or infiltrates.  EKG showed sinus rhythm with nonspecific T wave change.  Patient was started on IV furosemide  and cardiology was consulted for management.  2D echocardiogram on 12/1 showed LVEF 35 to 40%, with severely reduced RV systolic function, severe MR, and severe aortic insufficiency.  There is some nodular thickening at the leaflet tips.  The patient had no evidence of fever or chills and WBC count 10.6 on admission.  Volume status continued to improve with diuresis and GDMT was added for HFrEF/NICM.  On 12/4 cardiology recommended transfer to [facility] for advanced heart failure team assessment.    CTS consulted on 12/5 where a cardiac MRI showed a diastolic diameter of 7.7 cm.  Left ventricular ejection fraction 35% with global hypokinesis.  He underwent right cardiac catheterization which showed normal coronary anatomy.  Etiology suspected to be due to longstanding aortic insufficiency leading to left ventricular dilation and dysfunction with secondary MR due to leaflet tethering and restriction, also complicated by history of heavy alcohol use.  Patient was not a candidate for transplant. He underwent TEE, AVR and MVR with Impella placement on 12/8 -12/24 Dr. Sherrine.  Postop he is transferred to ICU and recovered well initially.  However postop course became complicated after right thoracentesis with intercostal artery  laceration and massive right hemothorax, hemodynamic instability and development of an acute abdomen with air within the wall of the distal small bowel and cecum.  IR consulted on 12/11 for emergent IR embolization performed by Dr. Philip.  On 12/12 patient underwent exploratory lap and small bowel resection with wound VAC placement by Dr. Sebastian.     He returned to the OR on 12/15 for reexploration of abdomen and right colectomy by Dr. Tanda.  On 12/16 patient required small bowel resection of 75 cm of distal ileum with right hemicolectomy and ileostomy. Chest tubes placed for right hemothorax.  He was extubated on 12/19 but reintubated on 12/21 with findings of an ileus.  Tracheostomy was placed on 12/23 and he became febrile postprocedure. IV antibiotics Maxipime  initiated per ID recommendations for positive micafungin  cultures, then transitioned to cefepime .  Impella was removed in the OR on 12/24. Hospital course complicated by atrial fibs/flutter, acute respiratory failure, sepsis with septic shock due to recurrent aspiration pneumonia with Enterobacter, alcohol withdrawal, acute septic encephalopathy, high ostomy output, and anemia of critical illness.  Maintained on heparin  anticoagulation for AF, DVT prophylaxis and AVR/MVR with pericardial valves.  Pharmacy investigated for heparin  to Coumadin  bridge.     He was decannulated on 1/5 and advance to a regular diet, thin liquids and has been tolerating very well with no concerns for aspiration during speech evaluation.  Cortrak remains while calorie  count is underway. 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  Per chart review he lives in a 1 level home with one-step to enter with spouse and 3 children.  Currently, he is mod assist for mobility, min assist to power up to stand for transfers, and CGA with supervision for safety ambulating at a reduced speed without dynamic gait challenges at  distance of 550 feet with walker. Therapy evaluations completed due to patient decreased functional mobility was admitted for a comprehensive rehab program.  Patient transferred to CIR on 12/28/2024 .   Patient currently requires min with mobility secondary to muscle weakness, decreased cardiorespiratoy endurance, and decreased standing balance, decreased postural control, decreased balance strategies, and difficulty maintaining precautions.  Prior to hospitalization, patient was independent  with mobility and lived with Spouse in a House home.  Home access is 1 (more of threshhold)Stairs to enter.  Patient will benefit from skilled PT intervention to maximize safe functional mobility, minimize fall risk, and decrease caregiver burden for planned discharge home with 24 hour assist.  Anticipate patient will benefit from follow up OP at discharge.  PT - End of Session Activity Tolerance: Tolerates 30+ min activity with multiple rests Endurance Deficit: Yes PT Assessment Rehab Potential (ACUTE/IP ONLY): Good PT Barriers to Discharge: Nutrition means PT Patient demonstrates impairments in the following area(s): Balance;Endurance;Motor;Nutrition;Safety;Skin Integrity PT Transfers Functional Problem(s): Bed Mobility;Bed to Chair;Car;Furniture;Floor PT Locomotion Functional Problem(s): Ambulation;Stairs PT Plan PT Intensity: Minimum of 1-2 x/day ,45 to 90 minutes PT Frequency: 5 out of 7 days PT Duration Estimated Length of Stay: 7-10 days PT Treatment/Interventions: Ambulation/gait training;Discharge planning;Functional mobility training;Psychosocial support;Therapeutic Activities;Visual/perceptual remediation/compensation;Wheelchair propulsion/positioning;Therapeutic Exercise;Skin care/wound management;Neuromuscular re-education;Disease management/prevention;Balance/vestibular training;Cognitive remediation/compensation;DME/adaptive equipment instruction;Pain management;Splinting/orthotics;UE/LE Strength  taining/ROM;UE/LE Coordination activities;Stair training;Community reintegration;Functional electrical stimulation;Patient/family education PT Transfers Anticipated Outcome(s): mod I PT Locomotion Anticipated Outcome(s): mod I PT Recommendation Recommendations for Other Services: Neuropsych consult Follow Up Recommendations: Outpatient PT Patient destination: Home Equipment Recommended: To be determined   PT Evaluation Precautions/Restrictions Precautions Precautions: Fall;Sternal Precaution/Restrictions Comments: Feeding tube, wound vac, ostomy Restrictions Weight Bearing Restrictions Per Provider Order: Yes Other Position/Activity Restrictions: sternal precautions Pain Interference Pain Interference Pain Effect on Sleep: 2. Occasionally Pain Interference with Therapy Activities: 1. Rarely or not at all Pain Interference with Day-to-Day Activities: 1. Rarely or not at all Home Living/Prior Functioning Home Living Living Arrangements: Spouse/significant other;Children Available Help at Discharge: Family;Available 24 hours/day Type of Home: House Home Access: Stairs to enter [employer] of Steps: 1 (more of threshhold) Entrance Stairs-Rails: None Home Layout: One level Bathroom Shower/Tub: Forensic Scientist: Standard Bathroom Accessibility: Yes  Lives With: Spouse Prior Function Level of Independence: Independent with transfers;Independent with gait;Independent with homemaking with ambulation  Able to Take Stairs?: Yes Driving: Yes Vocation: Part time employment Leisure: Hobbies-yes (Comment) (playing with kids and work') Vision/Perception  Vision - History Ability to See in Adequate Light: 0 Adequate Perception Perception: Within Functional Limits Praxis Praxis: WFL  Cognition Overall Cognitive Status: Within Functional Limits for tasks assessed Arousal/Alertness: Awake/alert Focused Attention: Appears intact Sustained Attention:  Appears intact Memory: Appears intact Awareness: Appears intact Problem Solving: Appears intact Safety/Judgment: Appears intact Sensation Sensation Light Touch: Appears Intact Hot/Cold: Appears Intact Proprioception: Appears Intact Stereognosis: Not tested Coordination Gross Motor Movements are Fluid and Coordinated: No Fine Motor Movements are Fluid and Coordinated: Yes Coordination and Movement Description: 2/2 weakness and precautions Motor  Motor Motor: Other (comment) Motor - Skilled Clinical Observations:  2/2 general weakness   Trunk/Postural Assessment  Cervical Assessment Cervical Assessment: Within Functional Limits Thoracic Assessment Thoracic Assessment: Exceptions to [facility] (rounded shoulders) Lumbar Assessment Lumbar Assessment: Exceptions to [facility] (posterior pelvic tilt) Postural Control Postural Control: Deficits on evaluation Protective Responses: inadequate  Balance Balance Balance Assessed: Yes Static Sitting Balance Static Sitting - Balance Support: Feet supported;No upper extremity supported Static Sitting - Level of Assistance: 6: Modified independent (Device/Increase time) Dynamic Sitting Balance Dynamic Sitting - Balance Support: No upper extremity supported;During functional activity Dynamic Sitting - Level of Assistance: 5: Stand by assistance Static Standing Balance Static Standing - Balance Support: During functional activity;No upper extremity supported Static Standing - Level of Assistance: 5: Stand by assistance Dynamic Standing Balance Dynamic Standing - Balance Support: During functional activity;No upper extremity supported Dynamic Standing - Level of Assistance: 4: Min assist Extremity Assessment  RUE Assessment RUE Assessment: Exceptions to [facility] Active Range of Motion (AROM) Comments: WFL General Strength Comments: limited d/t precautions proximal, distally WNL LUE Assessment LUE Assessment: Exceptions to [facility] Active Range of Motion  (AROM) Comments: WFL General Strength Comments: limited d/t precautions proximal, distally WNL RLE Assessment General Strength Comments: 4/5 grossly LLE Assessment General Strength Comments: 4/5 grossly  Care Tool Care Tool Bed Mobility Roll left and right activity   Roll left and right assist level: Supervision/Verbal cueing    Sit to lying activity   Sit to lying assist level: Supervision/Verbal cueing    Lying to sitting on side of bed activity   Lying to sitting on side of bed assist level: the ability to move from lying on the back to sitting on the side of the bed with no back support.: Supervision/Verbal cueing     Care Tool Transfers Sit to stand transfer   Sit to stand assist level: Contact Guard/Touching assist    Chair/bed transfer   Chair/bed transfer assist level: Contact Guard/Touching assist    Car transfer   Car transfer assist level: Contact Guard/Touching assist      Care Tool Locomotion Ambulation   Assist level: Contact Guard/Touching assist Assistive device: No Device Max distance: 150  Walk 10 feet activity   Assist level: Contact Guard/Touching assist Assistive device: No Device   Walk 50 feet with 2 turns activity   Assist level: Contact Guard/Touching assist Assistive device: No Device  Walk 150 feet activity   Assist level: Contact Guard/Touching assist Assistive device: No Device  Walk 10 feet on uneven surfaces activity   Assist level: Contact Guard/Touching assist    Stairs   Assist level: Minimal Assistance - Patient > 75% Stairs assistive device: 2 hand rails Max number of stairs: 8  Walk up/down 1 step activity   Walk up/down 1 step (curb) assist level: Minimal Assistance - Patient > 75% Walk up/down 1 step or curb assistive device: 2 hand rails  Walk up/down 4 steps activity   Walk up/down 4 steps assist level: Minimal Assistance - Patient > 75% Walk up/down 4 steps assistive device: 2 hand rails  Walk up/down 12 steps  activity Walk up/down 12 steps activity did not occur: Safety/medical concerns (fatigue/weakness)      Pick up small objects from floor Pick up small object from the floor (from standing position) activity did not occur: Safety/medical concerns (fatigue/weakness)      Wheelchair Is the patient using a wheelchair?: Yes Type of Wheelchair: Manual   Wheelchair assist level: Dependent - Patient 0%    Wheel 50 feet with 2 turns activity  Assist Level: Dependent - Patient 0%  Wheel 150 feet activity   Assist Level: Dependent - Patient 0%    Refer to Care Plan for Long Term Goals  SHORT TERM GOAL WEEK 1 PT Short Term Goal 1 (Week 1): STG=LTG due to ELOS  Recommendations for other services: Neuropsych  Evaluation completed (see details above) with patient education regarding purpose of PT evaluation, PT POC and goals, therapy schedule, weekly team meetings, and other CIR information including safety plan and fall risk safety. Pt semi-reclined in bed with nursing emptying out ostomy bag upon arrival. Pt denies pain and agreeable to PT eval. Pt ambulating without AD with CGA due to unsteadiness but no LOB noted - pt amb ~100 ft and ~150 ft x2. Pt asc/desc 10 ft ramp in same manner. At end of session, pt remained seated EOB, bed alarm set, and all needs in reach. Pt performed the below functional mobility tasks with the specified levels of skilled cuing and assistance.  Skilled Therapeutic Intervention Mobility Bed Mobility Bed Mobility: Supine to Sit;Sit to Supine;Rolling Right;Rolling Left Rolling Right: Supervision/verbal cueing Rolling Left: Supervision/Verbal cueing Supine to Sit: Supervision/Verbal cueing Sit to Supine: Supervision/Verbal cueing Scooting to HOB: Supervision/Verbal Cueing Transfers Transfers: Sit to Stand;Stand to Sit;Stand Pivot Transfers Sit to Stand: Contact Guard/Touching assist Stand to Sit: Contact Guard/Touching assist Stand Pivot Transfers: Contact  Guard/Touching assist Transfer (Assistive device): None Locomotion  Gait Ambulation: Yes Gait Assistance: Contact Guard/Touching assist Gait Distance (Feet): 150 Feet Assistive device: None Gait Gait: Yes Gait Pattern: Wide base of support;Step-to pattern;Decreased step length - right;Decreased step length - left;Decreased stride length Gait velocity: decreased Stairs / Additional Locomotion Stairs: Yes Stairs Assistance: Contact Guard/Touching assist Stair Management Technique: Two rails (light use for steadying) Number of Stairs: 8 Height of Stairs: 6 Ramp: Contact Guard/touching assist Wheelchair Mobility Wheelchair Mobility: Yes Wheelchair Assistance: Dependent - Patient 0% Wheelchair Parts Management: Needs assistance   Discharge Criteria: Patient will be discharged from PT if patient refuses treatment 3 consecutive times without medical reason, if treatment goals not met, if there is a change in medical status, if patient makes no progress towards goals or if patient is discharged from hospital.  The above assessment, treatment plan, treatment alternatives and goals were discussed and mutually agreed upon: by patient and by family  Comer CHRISTELLA Levora Comer Levora, PT, DPT 12/29/2024, 2:14 PM

## 2024-12-29 NOTE — Progress Notes (Signed)
 "    Advanced Heart Failure Rounding Note  Cardiologist: None  Chief Complaint: Valvular Heart Disease & HFrEF Patient Profile   44 y.o. male w/ h/o heavy alcohol use (at least 5 drinks per evening) and h/o asthma admitted w/ acute systolic heart failure. Strong family history of cardiac disease: mother and maternal grandmother had heart failure and father with CAD. Admitted with acute HFrEF/cardiogenic shock. Echo w/ severe AR, MR and LV dysfunction.    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  12/9 VT/VF ? vagal episode. Extubated 12/11: S/p R thoracentesis with resultant hemothorax.  Chest tube placed. S/p R intercostal artery coil (IR). S/p exp lap for ischemic bowel resection, bowel left in discontinuity 12/15: Back to OR for ischemic R colon and mesenteric ischemia. S/p exp/lap, wound vac exchange, R colectomy without anastomosis.  12/16 Back to OR for small bowel resection and ileostomy  12/18 OR for VATS/hemothorax washout, pericardial window was not done.  12/19 extubated 12/21 Reintubated. Ileus 12/23 S/p Trach. Fever post procedure, Tmax 101. Abx switched to cefepime    12/24 Impella removed 12/25: DBA stopped. Diuretics held d/t low CVPs 12/26: Chest tube/foley out 12/31: Started 7 day course of ceftriaxone  for fevers  1/3: Bleeding from ostomy output, heparin  gtt paused  12/28/24: Trach capped and decannulated. Tx to CIR.   Subjective:    Remains on coumadin  but continues off heparin  gtt. INR pending for today. Hgb 7.9>>8.3>8.2.   No BRBPR reported today.   K 5.3>5.2 Scr WNL  Feels good, happy to be out of ICU and continue to work on getting stronger to get home.   Objective:    Weight Range: 63 kg Body mass index is 19.93 kg/m.   Vital Signs:   Temp:  [97.5 F (36.4 C)-99.2 F (37.3 C)] 97.5 F (36.4 C) (01/06 0812) Pulse Rate:  [97-109] 109 (01/06 0812) Resp:  [16-32] 16 (01/06 0812) BP: (83-112)/(48-79) 102/74  (01/06 0812) SpO2:  [97 %-100 %] 100 % (01/06 0812) Weight:  [63 kg-64.2 kg] 63 kg (01/06 0332) Last BM Date : 12/28/24  Weight change: Filed Weights   12/28/24 1733 12/29/24 0332  Weight: 64.2 kg 63 kg   Intake/Output:  Intake/Output Summary (Last 24 hours) at 12/29/2024 0917 Last data filed at 12/29/2024 0815 Gross per 24 hour  Intake 1176 ml  Output 1750 ml  Net -574 ml    Physical Exam  General:  chronically ill appearing.  No respiratory difficulty. + cortrak Neck: JVD ~6/7 cm.  Cor: Regular rate & rhythm. No murmurs. Lungs: clear Extremities: no edema  Neuro: alert & oriented x 3. Affect pleasant.   No longer on tele in CIR.   Labs    CBC Recent Labs    12/28/24 0119 12/29/24 0538  WBC 11.8* 11.2*  NEUTROABS  --  4.7  HGB 8.3* 8.2*  HCT 25.4* 24.5*  MCV 89.1 88.4  PLT 424* 421*   Basic Metabolic Panel Recent Labs    98/94/73 0119 12/29/24 0538  NA 129* 125*  K 5.3* 5.2*  CL 97* 93*  CO2 20* 22  GLUCOSE 141* 115*  BUN 17 22*  CREATININE 0.58* 0.72  CALCIUM  9.0 9.4  MG 1.5* 2.0   Liver Function Tests Recent Labs    12/27/24 1016 12/29/24 0538  AST  --  50*  ALT  --  116*  ALKPHOS  --  350*  BILITOT  --  0.8  PROT  --  7.9  ALBUMIN  2.6* 2.9*    ProBNP (last 3 results) Recent Labs    11/22/24 1958  PROBNP 3,354.0*   Fasting Lipid Panel No results for input(s): CHOL, HDL, LDLCALC, TRIG, CHOLHDL, LDLDIRECT in the last 72 hours.  Medications:    Scheduled Medications:  amiodarone   200 mg Per Tube BID   arformoterol   15 mcg Nebulization BID   Chlorhexidine  Gluconate Cloth  6 each Topical Daily   colchicine   0.3 mg Per Tube Daily   [START ON 01/04/2025] cyanocobalamin   1,000 mcg Intramuscular Weekly   digoxin   0.0625 mg Per Tube Daily   diphenoxylate -atropine   1 tablet Per Tube QID   empagliflozin   10 mg Oral Daily   feeding supplement (PROSource TF20)  60 mL Per Tube BID   fiber supplement (BANATROL TF)  60 mL Per Tube  QID   folic acid   1 mg Oral QHS   free water   100 mL Per Tube Q4H   lidocaine   1 patch Transdermal Q24H   loperamide  HCl  4 mg Per Tube Q6H   losartan   25 mg Per Tube Daily   multivitamin with minerals  1 tablet Per Tube Daily   mouth rinse  15 mL Mouth Rinse 4 times per day   pantoprazole  (PROTONIX ) IV  40 mg Intravenous Daily   polycarbophil  625 mg Per Tube Daily   QUEtiapine   25 mg Per Tube QHS   Relizorb  1 Cartridge Does not apply Q0600   revefenacin   175 mcg Nebulization Daily   scopolamine   1 patch Transdermal Q72H   sodium zirconium cyclosilicate   5 g Oral Once   thiamine   100 mg Per Tube QHS   Warfarin - Pharmacist Dosing Inpatient   Does not apply q1600    Infusions:  cefTRIAXone  (ROCEPHIN )  IV     feeding supplement (VITAL 1.5 CAL) 45 mL/hr at 12/28/24 2000    PRN Medications: acetaminophen  (TYLENOL ) oral liquid 160 mg/5 mL, albuterol , alum & mag hydroxide-simeth, bisacodyl , diphenhydrAMINE , guaiFENesin -dextromethorphan, ondansetron  (ZOFRAN ) IV, oxyCODONE , polyethylene glycol, simethicone , sodium chloride , sodium phosphate , traZODone   Assessment/Plan   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  Valvular Disease  - severe MR posteriorly directed, mod TR. Likely functional.  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   Acute Systolic Heart Failure w/ Biventricular Dysfunction >> Cardiogenic Shock - HS trop not c/w ACS but EKG w/ anteroseptal Qs  - CMRI Severe AR/MR. LVEF 35%  - Cath- normal cors, RA5, PA 32/17 (24), PVR 2.57 Papi 3. CO 4.9 CI 3.8 - Post-op Echo 12/02/24: EF < 20% with severe LVH (new, ?inflammatory), moderately decreased RV function, stable bioprosthetic MV and AoV.  - Impella removed 12/24  -  Ltd echo 12/28: EF 40-45%, mildly reduced RV - Euvolemic - K 5.3>5.2. Off spiro. Lokelma  today.  - Continue losartan  25 mg daily - Decreased digoxin  to 0.0625 mg daily, dig level 0.9 1/2. Next check scheduled for the 8th.  - Continue Jardiance  10 mg daily - No BP room to titrate further - Stable from a HF standpoint for CIR  Ischemic bowel - S/p exp/lap 12/11. Patchy ischemia of the distal ileum extending over about 75 cm without perforation. Ischemic segment resected.  Colon was distended with gas  but viable. Small bowel left in discontinuity.  - Back to OR 12/15 for ischemic R colon and mesenteric ischemia. S/p exp/lap, wound vac exchange, R colectomy without anastomosis.  - Returned to OR 12/16 for ileostomy creation.  - GSU following. Ileostomy working well, output imrpoving - TPN off. On TF. Now eating solid food. TF on hold with BRBPR - Continue warfarin. Continue to hold heparin  gtt   R hemothorax: resolved - Hemothorax s/p right thoracentesis with intercostal artery injury. Massive bleeding requiring multiple products and development of hemorrhagic shock, now improved. S/p IR embolization.  - S/p VATS 12/18 with right chest washout.  - H/H stable. CT out.  ETOH Abuse - heavy drinker, at least 5 drinks per evening - may be contributing to CM. Did not withdrawal - cessation needed after discharge   DMII  - Hgb A1C 6.1 - On SSI.  ID - BAL w/ enterobacter cloacae and candida albicans. Zosyn  > meropenem  (12/21 to 12/23) - Completed cefepime  (12/23 to 12/26) - Completed micafungin  (12/22 to 12/29) - Restarted CTX 12/31 for low fevers, secretions. WBC count improving, 7 day course  AKI - Resolved.  VT - Quiescent - continue amio 200 mg bid  Pericardial effusion - Echo 12/26 moderate effusion without tamponade - Limited echo 12/17 with moderate effusion.  - This was not drained at time of VATS/right hemothorax washout on 12/18. Appeared fibrinous. - Ltd echo 12/28 with  increased, large pericardial effusion, no tamponade - continue colchicine  0.3 mg daily - Repeat limited echo 1/2 reviewed, no change  12 Atrial flutter - Prev in/out of AFL with controlled rate.  - Remains in NSR. Continue amio 200 mg bid - Warfarin started 12/30, INR 1.1 yesterday, pending for today.     Length of Stay: 1   Beckey LITTIE Coe, NP  12/29/2024, 9:17 AM  Advanced Heart Failure Team Pager [phone] (M-F; 7a - 5p)   Please visit [email or web address]: For overnight coverage please call cardiology fellow first. If fellow not available call Shock/ECMO MD on call.  For ECMO / Mechanical Support (Impella, IABP, LVAD) issues call Shock / ECMO MD on call.   "

## 2024-12-29 NOTE — Progress Notes (Signed)
 " Inpatient Rehabilitation Care Coordinator Assessment and Plan Patient Details  Name: Alejandro Lopez MRN: [id number] Date of Birth: Mar 29, 1981  Today's Date: 12/29/2024  Hospital Problems: Principal Problem:   Debility  Past Medical History:  Past Medical History:  Diagnosis Date   Asthma    Past Surgical History:  Past Surgical History:  Procedure Laterality Date   AORTIC VALVE REPLACEMENT N/A 11/30/2024   Procedure: REPLACEMENT, AORTIC VALVE, OPEN WITH INSPIRIS RESILIA AORTIC VALVE 23mm;  Surgeon: Lucas Dorise POUR, MD;  Location: [facility] OR;  Service: Open Heart Surgery;  Laterality: N/A;   BOWEL RESECTION N/A 12/08/2024   Procedure: EXCISION, SMALL INTESTINE;  Surgeon: Ebbie Cough, MD;  Location: [facility] OR;  Service: General;  Laterality: N/A;   COLOSTOMY REVISION Right 12/06/2024   Procedure: COLECTOMY, RIGHT;  Surgeon: Tanda Locus, MD;  Location: [facility] OR;  Service: General;  Laterality: Right;   FRACTURE SURGERY     ILEOSTOMY N/A 12/08/2024   Procedure: CREATION, ILEOSTOMY END;  Surgeon: Ebbie Cough, MD;  Location: [facility] OR;  Service: General;  Laterality: N/A;   INTRAOPERATIVE TRANSESOPHAGEAL ECHOCARDIOGRAM N/A 11/30/2024   Procedure: ECHOCARDIOGRAM, TRANSESOPHAGEAL, INTRAOPERATIVE;  Surgeon: Lucas Dorise POUR, MD;  Location: [facility] OR;  Service: Open Heart Surgery;  Laterality: N/A;   IR ANGIOGRAM FOLLOW UP STUDY  12/04/2024   IR ANGIOGRAM SELECTIVE EACH ADDITIONAL VESSEL  12/04/2024   IR ANGIOGRAM VISCERAL SELECTIVE  12/04/2024   IR EMBO ART  VEN HEMORR LYMPH EXTRAV  INC GUIDE ROADMAPPING  12/04/2024   IR US  GUIDE VASC ACCESS RIGHT  12/04/2024   LAPAROTOMY N/A 12/03/2024   Procedure: LAPAROTOMY, EXPLORATORY small bowel resection abthrea therapy;  Surgeon: Sebastian Moles, MD;  Location: [facility] OR;  Service: General;  Laterality: N/A;  possible bowel resection   LAPAROTOMY N/A 12/06/2024   Procedure: LAPAROTOMY, EXPLORATORY; WOUND VAC CHANGE;  Surgeon: Tanda Locus, MD;  Location: [facility]  OR;  Service: General;  Laterality: N/A;   LAPAROTOMY N/A 12/08/2024   Procedure: MACARIO LIVINGS;  Surgeon: Ebbie Cough, MD;  Location: [facility] OR;  Service: General;  Laterality: N/A;   MITRAL VALVE REPLACEMENT N/A 11/30/2024   Procedure: REPLACEMENT, MITRAL VALVE WITH MITRIS RESILIA MITRAL VALVE 27mm;  Surgeon: Lucas Dorise POUR, MD;  Location: [facility] OR;  Service: Open Heart Surgery;  Laterality: N/A;   PLACEMENT OF IMPELLA LEFT VENTRICULAR ASSIST DEVICE N/A 11/30/2024   Procedure: INSERTION, CARDIAC ASSIST DEVICE, IMPELLA;  Surgeon: Lucas Dorise POUR, MD;  Location: [facility] OR;  Service: Open Heart Surgery;  Laterality: N/A;  IMPELLA 5.5 INSERTION   REMOVAL OF IMPELLA LEFT VENTRICULAR ASSIST DEVICE Right 12/16/2024   Procedure: REMOVAL, CARDIAC ASSIST DEVICE, IMPELLA;  Surgeon: Lucas Dorise POUR, MD;  Location: [facility] OR;  Service: Open Heart Surgery;  Laterality: Right;   RIGHT HEART CATH AND CORONARY ANGIOGRAPHY N/A 11/27/2024   Procedure: RIGHT HEART CATH AND CORONARY ANGIOGRAPHY;  Surgeon: Zenaida Morene PARAS, MD;  Location: [facility] INVASIVE CV LAB;  Service: Cardiovascular;  Laterality: N/A;   TRANSESOPHAGEAL ECHOCARDIOGRAM (CATH LAB) N/A 11/27/2024   Procedure: TRANSESOPHAGEAL ECHOCARDIOGRAM;  Surgeon: Zenaida Morene PARAS, MD;  Location: [facility] INVASIVE CV LAB;  Service: Cardiovascular;  Laterality: N/A;   VIDEO ASSISTED THORACOSCOPY (VATS)/DECORTICATION Right 12/10/2024   Procedure: VIDEO ASSISTED THORACOSCOPY (VATS)/DECORTICATION;  Surgeon: Shyrl Linnie KIDD, MD;  Location: [facility] OR;  Service: Thoracic;  Laterality: Right;   Social History:  reports that he has been smoking cigarettes. He has never used smokeless tobacco. He reports current alcohol use of about 4.0 standard drinks of alcohol  per week. He reports current drug use. Drug: Marijuana.  Family / Support Systems Marital Status: Married Patient Roles: Spouse, Parent, Other (Comment) (employee) Spouse/Significant Other: [name] [phone] Children:  They have three children ages 63, 62, 8 all in school Other Supports: Friends and neighbors Anticipated Caregiver: wife Ability/Limitations of Caregiver: Wife does best boy Caregiver Availability: 24/7 Family Dynamics: Close with family and friends who will be there for him. He and wife feel they have good supports and hope pt does well here and can be as independent as possible when he is discharged  Social History Preferred language: English Religion: None Cultural Background: NA Education: HS Health Literacy - How often do you need to have someone help you when you read instructions, pamphlets, or other written material from your doctor or pharmacy?: Never Writes: Yes Employment Status: Employed Name of Employer: [employer] Return to Work Plans: Hopes to return to work once is recovered from all of this Marine Scientist Issues: NA Guardian/Conservator: None-according to MD pt  is capable of making his own decisions, his wife is present today and provides support to him   Abuse/Neglect Abuse/Neglect Assessment Can Be Completed: Yes Physical Abuse: Denies Verbal Abuse: Denies Sexual Abuse: Denies Exploitation of patient/patient's resources: Denies Self-Neglect: Denies  Patient response to: Social Isolation - How often do you feel lonely or isolated from those around you?: Never  Emotional Status Pt's affect, behavior and adjustment status: Pt and wife report pt was very independent prior to this happening to him. He realizes he needs to change his lifestyle habits and take care of himself and his medical issues. His mom and grandmother have the same heart issues. He worked full time and helped with the kids prior to admission Recent Psychosocial Issues: other health issues and wil need PCP when discharged from the hospital Psychiatric History: No issues would benefit from seeing neuro-psych while here if he returns in time and pt is still  here. Substance Abuse History: Tobacco and ETOH aware of the health risks and plans to quit. He wants to be here for his kids  Patient / Family Perceptions, Expectations & Goals Pt/Family understanding of illness & functional limitations: Pt and wife can explain his health issues and procedures while he has been here in the hosptial. He has been in the hospital for 35 days and will be ready to go home when he is able too. Both feel good about being here on rehab and getting better and going home Premorbid pt/family roles/activities: husband, father, employee, freind, neighbor, etc Anticipated changes in roles/activities/participation: resume Pt/family expectations/goals: Pt states:  I hope to do well and get as close as I can to independent when I leave.  Wife states:  He will do well he is stubborn and independent.  Community Resources [employer]: None Premorbid Home Care/DME Agencies: None Transportation available at discharge: both he and wife Is the patient able to respond to transportation needs?: Yes In the past 12 months, has lack of transportation kept you from medical appointments or from getting medications?: No In the past 12 months, has lack of transportation kept you from meetings, work, or from getting things needed for daily living?: No Resource referrals recommended: Neuropsychology  Discharge Planning Living Arrangements: Spouse/significant other, Children Support Systems: Spouse/significant other, Children, Other relatives, Friends/neighbors Type of Residence: Private residence Insurance Resources: Media Planner (specify) ([employer]HC medicaid) Surveyor, Quantity Resources: Employment, Garment/textile Technologist Screen Referred: No Living Expenses: Rent Money Management: Patient, Spouse Does the patient  have any problems obtaining your medications?: Yes (Describe) (Did not go to the MD has no PCP) Home Management: wife Patient/Family Preliminary Plans: Return home with  wife who wil be his primary caregiver, she does do door dash but the kids are home int he evenings and his oldest daughter can be there if needed. He plans to be mod/i when he lesves here. He and wife will need education regarding ileostomy and wound care. Aware being evaluated today and goals being set for stay here. Care Coordinator Barriers to Discharge: Wound Care, Insurance for SNF coverage Care Coordinator Anticipated Follow Up Needs: HH/OP  Clinical Impression Pleasant gentleman and wife is present also. Both are motivated to do well and have pt recover while here. He has made great strides since coming into the hospital Wound vac discontinued today and will learn daily dressing changes. Will update tomorrow after team conference. Will work on discharge needs. Pt has good family supports  Raymonde Asberry MATSU 12/29/2024, 1:37 PM    "

## 2024-12-29 NOTE — Evaluation (Signed)
 Occupational Therapy Assessment and Plan  Patient Details  Name: Alejandro Lopez MRN: [id number] Date of Birth: 1981-10-14  OT Diagnosis: muscle weakness (generalized) Rehab Potential: Rehab Potential (ACUTE ONLY): Good ELOS: 7-10 days   Today's Date: 12/29/2024 OT Individual Time: 1000-1100 OT Individual Time Calculation (min): 60 min     Hospital Problem: Principal Problem:   Debility   Past Medical History:  Past Medical History:  Diagnosis Date   Asthma    Past Surgical History:  Past Surgical History:  Procedure Laterality Date   AORTIC VALVE REPLACEMENT N/A 11/30/2024   Procedure: REPLACEMENT, AORTIC VALVE, OPEN WITH INSPIRIS RESILIA AORTIC VALVE 23mm;  Surgeon: Lucas Dorise POUR, MD;  Location: [facility] OR;  Service: Open Heart Surgery;  Laterality: N/A;   BOWEL RESECTION N/A 12/08/2024   Procedure: EXCISION, SMALL INTESTINE;  Surgeon: Ebbie Cough, MD;  Location: [facility] OR;  Service: General;  Laterality: N/A;   COLOSTOMY REVISION Right 12/06/2024   Procedure: COLECTOMY, RIGHT;  Surgeon: Tanda Locus, MD;  Location: [facility] OR;  Service: General;  Laterality: Right;   FRACTURE SURGERY     ILEOSTOMY N/A 12/08/2024   Procedure: CREATION, ILEOSTOMY END;  Surgeon: Ebbie Cough, MD;  Location: [facility] OR;  Service: General;  Laterality: N/A;   INTRAOPERATIVE TRANSESOPHAGEAL ECHOCARDIOGRAM N/A 11/30/2024   Procedure: ECHOCARDIOGRAM, TRANSESOPHAGEAL, INTRAOPERATIVE;  Surgeon: Lucas Dorise POUR, MD;  Location: [facility] OR;  Service: Open Heart Surgery;  Laterality: N/A;   IR ANGIOGRAM FOLLOW UP STUDY  12/04/2024   IR ANGIOGRAM SELECTIVE EACH ADDITIONAL VESSEL  12/04/2024   IR ANGIOGRAM VISCERAL SELECTIVE  12/04/2024   IR EMBO ART  VEN HEMORR LYMPH EXTRAV  INC GUIDE ROADMAPPING  12/04/2024   IR US  GUIDE VASC ACCESS RIGHT  12/04/2024   LAPAROTOMY N/A 12/03/2024   Procedure: LAPAROTOMY, EXPLORATORY small bowel resection abthrea therapy;  Surgeon: Sebastian Moles, MD;  Location: [facility] OR;  Service:  General;  Laterality: N/A;  possible bowel resection   LAPAROTOMY N/A 12/06/2024   Procedure: LAPAROTOMY, EXPLORATORY; WOUND VAC CHANGE;  Surgeon: Tanda Locus, MD;  Location: [facility] OR;  Service: General;  Laterality: N/A;   LAPAROTOMY N/A 12/08/2024   Procedure: MACARIO LIVINGS;  Surgeon: Ebbie Cough, MD;  Location: [facility] OR;  Service: General;  Laterality: N/A;   MITRAL VALVE REPLACEMENT N/A 11/30/2024   Procedure: REPLACEMENT, MITRAL VALVE WITH MITRIS RESILIA MITRAL VALVE 27mm;  Surgeon: Lucas Dorise POUR, MD;  Location: [facility] OR;  Service: Open Heart Surgery;  Laterality: N/A;   PLACEMENT OF IMPELLA LEFT VENTRICULAR ASSIST DEVICE N/A 11/30/2024   Procedure: INSERTION, CARDIAC ASSIST DEVICE, IMPELLA;  Surgeon: Lucas Dorise POUR, MD;  Location: [facility] OR;  Service: Open Heart Surgery;  Laterality: N/A;  IMPELLA 5.5 INSERTION   REMOVAL OF IMPELLA LEFT VENTRICULAR ASSIST DEVICE Right 12/16/2024   Procedure: REMOVAL, CARDIAC ASSIST DEVICE, IMPELLA;  Surgeon: Lucas Dorise POUR, MD;  Location: [facility] OR;  Service: Open Heart Surgery;  Laterality: Right;   RIGHT HEART CATH AND CORONARY ANGIOGRAPHY N/A 11/27/2024   Procedure: RIGHT HEART CATH AND CORONARY ANGIOGRAPHY;  Surgeon: Zenaida Morene PARAS, MD;  Location: [facility] INVASIVE CV LAB;  Service: Cardiovascular;  Laterality: N/A;   TRANSESOPHAGEAL ECHOCARDIOGRAM (CATH LAB) N/A 11/27/2024   Procedure: TRANSESOPHAGEAL ECHOCARDIOGRAM;  Surgeon: Zenaida Morene PARAS, MD;  Location: [facility] INVASIVE CV LAB;  Service: Cardiovascular;  Laterality: N/A;   VIDEO ASSISTED THORACOSCOPY (VATS)/DECORTICATION Right 12/10/2024   Procedure: VIDEO ASSISTED THORACOSCOPY (VATS)/DECORTICATION;  Surgeon: Shyrl Linnie KIDD, MD;  Location: [facility] OR;  Service: Thoracic;  Laterality: Right;  Assessment & Plan Clinical Impression:Alejandro Lopez is a 44 year old male with PMHx of mild hypertension alcohol abuse and asthma who presented to [place] ER on 11/22/24 with complaints of increased  swelling to bilateral lower extremities, groin, and abdomen with reports of a fullness sensation in his lower back. He had recently completed a course of Zithromax  for community acquired pneumonia, shortness of breath and nonproductive cough remained.  In the ER patient was hemodynamically stable with oxygen  saturation 97-99% room air.  WBC 10.6, hemoglobin 15.1, platelets 206.  AST 71, ALT 100, alk phosphatase 148, total bilirubin 1.3.  Sodium 141, potassium 3.8, bicarbonate 28, serum creatinine 0.89.  TSH 1.860.  UA was negative for pyuria.  proBNP 3354.  Troponin 27>> 27.  Chest x-ray was negative for edema or infiltrates.  EKG showed sinus rhythm with nonspecific T wave change.  Patient was started on IV furosemide  and cardiology was consulted for management.  2D echocardiogram on 12/1 showed LVEF 35 to 40%, with severely reduced RV systolic function, severe MR, and severe aortic insufficiency.  There is some nodular thickening at the leaflet tips.  The patient had no evidence of fever or chills and WBC count 10.6 on admission.  Volume status continued to improve with diuresis and GDMT was added for HFrEF/NICM.  On 12/4 cardiology recommended transfer to [facility] for advanced heart failure team assessment.    CTS consulted on 12/5 where a cardiac MRI showed a diastolic diameter of 7.7 cm.  Left ventricular ejection fraction 35% with global hypokinesis.  He underwent right cardiac catheterization which showed normal coronary anatomy.  Etiology suspected to be due to longstanding aortic insufficiency leading to left ventricular dilation and dysfunction with secondary MR due to leaflet tethering and restriction, also complicated by history of heavy alcohol use.  Patient was not a candidate for transplant. He underwent TEE, AVR and MVR with Impella placement on 12/8 -12/24 Dr. Sherrine.  Postop he is transferred to ICU and recovered well initially.  However postop course became complicated after right  thoracentesis with intercostal artery laceration and massive right hemothorax, hemodynamic instability and development of an acute abdomen with air within the wall of the distal small bowel and cecum.  IR consulted on 12/11 for emergent IR embolization performed by Dr. Philip.  On 12/12 patient underwent exploratory lap and small bowel resection with wound VAC placement by Dr. Sebastian.     He returned to the OR on 12/15 for reexploration of abdomen and right colectomy by Dr. Tanda.  On 12/16 patient required small bowel resection of 75 cm of distal ileum with right hemicolectomy and ileostomy. Chest tubes placed for right hemothorax.  He was extubated on 12/19 but reintubated on 12/21 with findings of an ileus.  Tracheostomy was placed on 12/23 and he became febrile postprocedure. IV antibiotics Maxipime  initiated per ID recommendations for positive micafungin  cultures, then transitioned to cefepime .  Impella was removed in the OR on 12/24. Hospital course complicated by atrial fibs/flutter, acute respiratory failure, sepsis with septic shock due to recurrent aspiration pneumonia with Enterobacter, alcohol withdrawal, acute septic encephalopathy, high ostomy output, and anemia of critical illness.  Maintained on heparin  anticoagulation for AF, DVT prophylaxis and AVR/MVR with pericardial valves.  Pharmacy investigated for heparin  to Coumadin  bridge.     He was decannulated on 1/5 and advance to a regular diet, thin liquids and has been tolerating very well with no concerns for aspiration during speech evaluation.  Cortrak remains while calorie  count is underway. 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  Per chart review he lives in a 1 level home with one-step to enter with spouse and 3 children.  Currently, he is mod assist for mobility, min assist to power up to stand for transfers, and CGA with supervision for safety ambulating at a reduced speed  without dynamic gait challenges at distance of 550 feet with walker. Therapy evaluations completed due to patient decreased functional mobility was admitted for a comprehensive rehab program..  Prior to hospitalization, patient could complete ADLs with independent .  Patient transferred to CIR on 12/28/2024 .    Patient will benefit from skilled intervention to decrease level of assist with basic self-care skills and increase independence with basic self-care skills prior to discharge home with care partner.  Anticipate patient will require intermittent supervision and follow up outpatient.  OT - End of Session Activity Tolerance: Tolerates 30+ min activity with multiple rests Endurance Deficit: Yes OT Assessment Rehab Potential (ACUTE ONLY): Good OT Barriers to Discharge: Nutrition means OT Patient demonstrates impairments in the following area(s): Balance;Cognition;Endurance;Motor;Nutrition;Pain;Safety;Sensory;Skin Integrity OT Basic ADL's Functional Problem(s): Eating;Grooming;Bathing;Dressing;Toileting OT Advanced ADL's Functional Problem(s): None OT Transfers Functional Problem(s): Toilet;Tub/Shower OT Additional Impairment(s): None OT Plan OT Intensity: Minimum of 1-2 x/day, 45 to 90 minutes OT Frequency: 5 out of 7 days OT Duration/Estimated Length of Stay: 7-10 days OT Treatment/Interventions: Balance/vestibular training;DME/adaptive equipment instruction;Patient/family education;Therapeutic Activities;Wheelchair propulsion/positioning;Therapeutic Exercise;Functional electrical stimulation;Psychosocial support;UE/LE Strength taining/ROM;Self Care/advanced ADL retraining;Community reintegration;Functional mobility training;Discharge planning;Neuromuscular re-education;Skin care/wound managment;UE/LE Coordination activities;Splinting/orthotics;Pain management;Disease mangement/prevention OT Self Feeding Anticipated Outcome(s): mod I OT Basic Self-Care Anticipated Outcome(s): mod I OT  Toileting Anticipated Outcome(s): mod I OT Bathroom Transfers Anticipated Outcome(s): mod I OT Recommendation Recommendations for Other Services: Neuropsych consult;Therapeutic Recreation consult Therapeutic Recreation Interventions: Pet therapy;Stress management Patient destination: Home Follow Up Recommendations: Outpatient OT Equipment Recommended: To be determined Equipment Details: owns no DME   OT Evaluation Precautions/Restrictions  Precautions Precautions: Fall;Sternal Precaution/Restrictions Comments: Feeding tube, wound vac, ostomy Restrictions Weight Bearing Restrictions Per Provider Order: Yes General Chart Reviewed: Yes Family/Caregiver Present: Yes (wife) Pain Pain Assessment Pain Scale: 0-10 Pain Score: 0-No pain Home Living/Prior Functioning Home Living Family/patient expects to be discharged to:: Private residence Living Arrangements: Spouse/significant other, Children Available Help at Discharge: Family, Available 24 hours/day Type of Home: House Home Access: Stairs to enter [employer] of Steps: 1 (more of threshhold) Entrance Stairs-Rails: None Home Layout: One level Bathroom Shower/Tub: Tub/shower unit, Engineer, Building Services: Standard Bathroom Accessibility: Yes  Lives With: Spouse (3 kids and granddaughter) IADL History Homemaking Responsibilities: Yes Meal Prep Responsibility: Secondary Laundry Responsibility: Secondary Cleaning Responsibility: Secondary Child Care Responsibility: Secondary Current License: Yes Mode of Transportation: Other (comment) ([employer]) Occupation: Part time employment Type of Occupation: chief executive officer Prior Function Level of Independence: Independent with basic ADLs, Independent with transfers, Independent with gait  Able to Take Stairs?: Yes Driving: Yes Vocation: Part time employment Leisure: Hobbies-yes (Comment) (playing with kids and work') Vision Baseline Vision/History: 0 No visual  deficits Ability to See in Adequate Light: 0 Adequate Perception  Perception: Within Functional Limits Praxis Praxis: WFL Cognition Cognition Overall Cognitive Status: Within Functional Limits for tasks assessed Arousal/Alertness: Awake/alert Orientation Level: Person;Place Memory: Appears intact Attention: Focused;Sustained Focused Attention: Appears intact Sustained Attention: Appears intact Awareness: Appears intact Problem Solving: Appears intact Safety/Judgment: Appears intact Brief Interview for Mental Status (BIMS) Repetition of Three Words (First Attempt): 3 Temporal Orientation: Year:  Correct Temporal Orientation: Month: Accurate within 5 days Temporal Orientation: Day: Correct Recall: Sock: Yes, no cue required Recall: Blue: Yes, after cueing (a color) Recall: Bed: Yes, no cue required BIMS Summary Score: 14 Sensation Sensation Light Touch: Appears Intact Hot/Cold: Appears Intact Proprioception: Appears Intact Stereognosis: Not tested Coordination Gross Motor Movements are Fluid and Coordinated: No Fine Motor Movements are Fluid and Coordinated: Yes Coordination and Movement Description: 2/2 weakness and precautions Motor  Motor Motor: Other (comment) Motor - Skilled Clinical Observations: 2/2 general weakness  Trunk/Postural Assessment  Cervical Assessment Cervical Assessment: Within Functional Limits Thoracic Assessment Thoracic Assessment: Exceptions to [facility] (rounded shoulders) Lumbar Assessment Lumbar Assessment: Exceptions to [facility] (posterior pelvic tilt) Postural Control Postural Control: Deficits on evaluation Protective Responses: inadequate  Balance Balance Balance Assessed: Yes Static Sitting Balance Static Sitting - Balance Support: Feet supported;No upper extremity supported Dynamic Sitting Balance Dynamic Sitting - Balance Support: No upper extremity supported;During functional activity Dynamic Sitting - Level of Assistance: 5:  Stand by assistance Static Standing Balance Static Standing - Balance Support: During functional activity;No upper extremity supported Static Standing - Level of Assistance: 5: Stand by assistance Dynamic Standing Balance Dynamic Standing - Balance Support: During functional activity;No upper extremity supported Dynamic Standing - Level of Assistance: 4: Min assist Extremity/Trunk Assessment RUE Assessment RUE Assessment: Exceptions to [facility] Active Range of Motion (AROM) Comments: WFL General Strength Comments: limited d/t precautions proximal, distally WNL LUE Assessment LUE Assessment: Exceptions to [facility] Active Range of Motion (AROM) Comments: WFL General Strength Comments: limited d/t precautions proximal, distally WNL  Care Tool Care Tool Self Care Eating   Eating Assist Level: Set up assist    Oral Care    Oral Care Assist Level: Set up assist    Bathing         Assist Level: Minimal Assistance - Patient > 75%    Upper Body Dressing(including orthotics)       Assist Level: Supervision/Verbal cueing    Lower Body Dressing (excluding footwear)     Assist for lower body dressing: Contact Guard/Touching assist    Putting on/Taking off footwear     Assist for footwear: Maximal Assistance - Patient 25 - 49%       Care Tool Toileting Toileting activity   Assist for toileting: Minimal Assistance - Patient > 75%     Care Tool Bed Mobility Roll left and right activity   Roll left and right assist level: Supervision/Verbal cueing    Sit to lying activity   Sit to lying assist level: Supervision/Verbal cueing    Lying to sitting on side of bed activity   Lying to sitting on side of bed assist level: the ability to move from lying on the back to sitting on the side of the bed with no back support.: Supervision/Verbal cueing     Care Tool Transfers Sit to stand transfer   Sit to stand assist level: Contact Guard/Touching assist    Chair/bed transfer   Chair/bed  transfer assist level: Contact Guard/Touching assist     Toilet transfer   Assist Level: Contact Guard/Touching assist     Care Tool Cognition  Expression of Ideas and Wants Expression of Ideas and Wants: 4. Without difficulty (complex and basic) - expresses complex messages without difficulty and with speech that is clear and easy to understand  Understanding Verbal and Non-Verbal Content Understanding Verbal and Non-Verbal Content: 4. Understands (complex and basic) - clear comprehension without cues or repetitions   Memory/Recall Ability Memory/Recall  Ability : Current season;Location of own room;Staff names and faces;That he or she is in a hospital/hospital unit   Refer to Care Plan for Long Term Goals  SHORT TERM GOAL WEEK 1 OT Short Term Goal 1 (Week 1): STG=LTG d/t ELOS  Recommendations for other services: Neuropsych and Therapeutic Recreation  Pet therapy   Skilled Therapeutic Intervention ADL ADL Eating: Set up Grooming: Contact guard Upper Body Bathing: Supervision/safety Where Assessed-Upper Body Bathing: Edge of bed Lower Body Bathing: Minimal assistance;Moderate assistance Where Assessed-Lower Body Bathing: Edge of bed Upper Body Dressing: Supervision/safety Where Assessed-Upper Body Dressing: Edge of bed Lower Body Dressing: Contact guard Toileting: Minimal assistance Toilet Transfer: Minimal assistance Toilet Transfer Method: Stand pivot Mobility  Bed Mobility Bed Mobility: Scooting to HOB;Sit to Supine;Supine to Sit;Rolling Right;Rolling Left Rolling Right: Supervision/verbal cueing Rolling Left: Supervision/Verbal cueing Supine to Sit: Supervision/Verbal cueing Sit to Supine: Supervision/Verbal cueing Scooting to HOB: Supervision/Verbal Cueing Transfers Sit to Stand: Contact Guard/Touching assist Stand to Sit: Contact Guard/Touching assist  1:1 evaluation and treatment session initiated this date. OT roles, goals and purpose discussed with pt as well as  therapy schedule. ADL completed this date with levels of assist listed above. Upon getting up, OT noting seal leak on ostomy. Nsg notified and ostomy bag changed. OT educating pt and wife on ostomy changes as change being completed. Pt able to stand unsupported in order to manage pants over waist with no LOB/SOB. Pt would benefit from skilled OT in IPR setting in order to maximize independence with ADLs upon D/C.   Discharge Criteria: Patient will be discharged from OT if patient refuses treatment 3 consecutive times without medical reason, if treatment goals not met, if there is a change in medical status, if patient makes no progress towards goals or if patient is discharged from hospital.  The above assessment, treatment plan, treatment alternatives and goals were discussed and mutually agreed upon: by patient and by family  Camie Hoe, OTD, OTR/L 12/29/2024, 12:36 PM

## 2024-12-29 NOTE — Plan of Care (Signed)
" °  Problem: RH Balance Goal: LTG Patient will maintain dynamic standing balance (PT) Description: LTG:  Patient will maintain dynamic standing balance with assistance during mobility activities (PT) Flowsheets (Taken 12/29/2024 1431) LTG: Pt will maintain dynamic standing balance during mobility activities with:: Independent with assistive device    Problem: Sit to Stand Goal: LTG:  Patient will perform sit to stand with assistance level (PT) Description: LTG:  Patient will perform sit to stand with assistance level (PT) Flowsheets (Taken 12/29/2024 1431) LTG: PT will perform sit to stand in preparation for functional mobility with assistance level: Independent with assistive device   Problem: RH Bed Mobility Goal: LTG Patient will perform bed mobility with assist (PT) Description: LTG: Patient will perform bed mobility with assistance, with/without cues (PT). Flowsheets (Taken 12/29/2024 1431) LTG: Pt will perform bed mobility with assistance level of: Independent with assistive device    Problem: RH Bed to Chair Transfers Goal: LTG Patient will perform bed/chair transfers w/assist (PT) Description: LTG: Patient will perform bed to chair transfers with assistance (PT). Flowsheets (Taken 12/29/2024 1431) LTG: Pt will perform Bed to Chair Transfers with assistance level: Independent with assistive device    Problem: RH Car Transfers Goal: LTG Patient will perform car transfers with assist (PT) Description: LTG: Patient will perform car transfers with assistance (PT). Flowsheets (Taken 12/29/2024 1431) LTG: Pt will perform car transfers with assist:: Set up assist    Problem: RH Furniture Transfers Goal: LTG Patient will perform furniture transfers w/assist (OT/PT) Description: LTG: Patient will perform furniture transfers  with assistance (OT/PT). Flowsheets (Taken 12/29/2024 1431) LTG: Pt will perform furniture transfers with assist:: Independent with assistive device    Problem: RH  Ambulation Goal: LTG Patient will ambulate in controlled environment (PT) Description: LTG: Patient will ambulate in a controlled environment, # of feet with assistance (PT). Flowsheets (Taken 12/29/2024 1431) LTG: Pt will ambulate in controlled environ  assist needed:: Independent with assistive device LTG: Ambulation distance in controlled environment: 150 ft using LRAD Goal: LTG Patient will ambulate in home environment (PT) Description: LTG: Patient will ambulate in home environment, # of feet with assistance (PT). Flowsheets (Taken 12/29/2024 1431) LTG: Pt will ambulate in home environ  assist needed:: Independent with assistive device LTG: Ambulation distance in home environment: 50 ft using LRAD   Problem: RH Stairs Goal: LTG Patient will ambulate up and down stairs w/assist (PT) Description: LTG: Patient will ambulate up and down # of stairs with assistance (PT) Flowsheets (Taken 12/29/2024 1431) LTG: Pt will ambulate up/down stairs assist needed:: Supervision/Verbal cueing LTG: Pt will  ambulate up and down number of stairs: per home set up with LRAD   "

## 2024-12-29 NOTE — Progress Notes (Signed)
 Inpatient Rehabilitation Admission Medication Review by a Pharmacist  A complete drug regimen review was completed for this patient to identify any potential clinically significant medication issues.  High Risk Drug Classes Is patient taking? Indication by Medication  Antipsychotic Yes Quetiapine  - mood, sleep  Anticoagulant Yes Warfarin - bioprosthetic AVR/MVR  Antibiotic Yes, as an intravenous medication Ceftriaxone  through 1/6 - fever, unknown source  Opioid Yes Oxycodone  - prn pain  Antiplatelet No   Hypoglycemics/insulin  Yes Empagliflozin  - HF  Vasoactive Medication Yes Amiodarone  - VT Digoxin  - HF Losartan  - HF  Chemotherapy No   Other Yes APAP - prn pain Albuterol  - prn ShOB Maalox - prn indigestion Arformoterol  - asthma Bisacodyl  - prn constipation Colchicine  - pericarditis / pericardial effusion Diphenhydramine  - prn itching Lomotil  - loose stools, high output ileostomy Vital TF / supplements - nutrition Folic Acid  - supplement Guaifenesin  DM - prn cough Lidocaine  patch - pain Loperamide  - loose stools, high output ileostomy MVI - supplement Ondansetron  - prn nausea Pantoprazole  - GERD Polycarbophil - fiber supplement / loose stools Miralax  - prn constipation Relizorb - pancreatic enzyme, pancreatic insufficiency Revefenacin  - asthma Scopolamine  patch - high output ileostomy Simthicone - prn gas Fleet enema - prn constipation Thiamine  - supplement Trazodone  - prn sleep     Type of Medication Issue Identified Description of Issue Recommendation(s)  Drug Interaction(s) (clinically significant)     Duplicate Therapy     Allergy     No Medication Administration End Date     Incorrect Dose     Additional Drug Therapy Needed     Significant med changes from prior encounter (inform family/care partners about these prior to discharge). No significant PTA meds noted.   Other       Clinically significant medication issues were identified that warrant  physician communication and completion of prescribed/recommended actions by midnight of the next day:  No  Name of provider notified for urgent issues identified:   Provider Method of Notification:     Pharmacist comments:   Time spent performing this drug regimen review (minutes):  30   Suni Jarnagin, Pharm.D., BCPS Clinical Pharmacist Clinical phone for 12/29/2024 from 7:30-3:00 is [phone].  **Pharmacist phone directory can be found on [email or web address] listed under [facility].  12/29/2024 10:09 AM

## 2024-12-29 NOTE — Evaluation (Signed)
 Speech Language Pathology Assessment and Plan  Patient Details  Name: Alejandro Lopez MRN: [id number] Date of Birth: 04/02/1981   Today's Date: 12/29/2024 SLP Individual Time: 0900-0930 SLP Individual Time Calculation (min): 30 min   Hospital Problem: Principal Problem:   Debility  Past Medical History:  Past Medical History:  Diagnosis Date   Asthma    Past Surgical History:  Past Surgical History:  Procedure Laterality Date   AORTIC VALVE REPLACEMENT N/A 11/30/2024   Procedure: REPLACEMENT, AORTIC VALVE, OPEN WITH INSPIRIS RESILIA AORTIC VALVE 23mm;  Surgeon: Lucas Dorise POUR, MD;  Location: [facility] OR;  Service: Open Heart Surgery;  Laterality: N/A;   BOWEL RESECTION N/A 12/08/2024   Procedure: EXCISION, SMALL INTESTINE;  Surgeon: Ebbie Cough, MD;  Location: [facility] OR;  Service: General;  Laterality: N/A;   COLOSTOMY REVISION Right 12/06/2024   Procedure: COLECTOMY, RIGHT;  Surgeon: Tanda Locus, MD;  Location: [facility] OR;  Service: General;  Laterality: Right;   FRACTURE SURGERY     ILEOSTOMY N/A 12/08/2024   Procedure: CREATION, ILEOSTOMY END;  Surgeon: Ebbie Cough, MD;  Location: [facility] OR;  Service: General;  Laterality: N/A;   INTRAOPERATIVE TRANSESOPHAGEAL ECHOCARDIOGRAM N/A 11/30/2024   Procedure: ECHOCARDIOGRAM, TRANSESOPHAGEAL, INTRAOPERATIVE;  Surgeon: Lucas Dorise POUR, MD;  Location: [facility] OR;  Service: Open Heart Surgery;  Laterality: N/A;   IR ANGIOGRAM FOLLOW UP STUDY  12/04/2024   IR ANGIOGRAM SELECTIVE EACH ADDITIONAL VESSEL  12/04/2024   IR ANGIOGRAM VISCERAL SELECTIVE  12/04/2024   IR EMBO ART  VEN HEMORR LYMPH EXTRAV  INC GUIDE ROADMAPPING  12/04/2024   IR US  GUIDE VASC ACCESS RIGHT  12/04/2024   LAPAROTOMY N/A 12/03/2024   Procedure: LAPAROTOMY, EXPLORATORY small bowel resection abthrea therapy;  Surgeon: Sebastian Moles, MD;  Location: [facility] OR;  Service: General;  Laterality: N/A;  possible bowel resection   LAPAROTOMY N/A 12/06/2024   Procedure: LAPAROTOMY,  EXPLORATORY; WOUND VAC CHANGE;  Surgeon: Tanda Locus, MD;  Location: [facility] OR;  Service: General;  Laterality: N/A;   LAPAROTOMY N/A 12/08/2024   Procedure: MACARIO LIVINGS;  Surgeon: Ebbie Cough, MD;  Location: [facility] OR;  Service: General;  Laterality: N/A;   MITRAL VALVE REPLACEMENT N/A 11/30/2024   Procedure: REPLACEMENT, MITRAL VALVE WITH MITRIS RESILIA MITRAL VALVE 27mm;  Surgeon: Lucas Dorise POUR, MD;  Location: [facility] OR;  Service: Open Heart Surgery;  Laterality: N/A;   PLACEMENT OF IMPELLA LEFT VENTRICULAR ASSIST DEVICE N/A 11/30/2024   Procedure: INSERTION, CARDIAC ASSIST DEVICE, IMPELLA;  Surgeon: Lucas Dorise POUR, MD;  Location: [facility] OR;  Service: Open Heart Surgery;  Laterality: N/A;  IMPELLA 5.5 INSERTION   REMOVAL OF IMPELLA LEFT VENTRICULAR ASSIST DEVICE Right 12/16/2024   Procedure: REMOVAL, CARDIAC ASSIST DEVICE, IMPELLA;  Surgeon: Lucas Dorise POUR, MD;  Location: [facility] OR;  Service: Open Heart Surgery;  Laterality: Right;   RIGHT HEART CATH AND CORONARY ANGIOGRAPHY N/A 11/27/2024   Procedure: RIGHT HEART CATH AND CORONARY ANGIOGRAPHY;  Surgeon: Zenaida Morene PARAS, MD;  Location: [facility] INVASIVE CV LAB;  Service: Cardiovascular;  Laterality: N/A;   TRANSESOPHAGEAL ECHOCARDIOGRAM (CATH LAB) N/A 11/27/2024   Procedure: TRANSESOPHAGEAL ECHOCARDIOGRAM;  Surgeon: Zenaida Morene PARAS, MD;  Location: [facility] INVASIVE CV LAB;  Service: Cardiovascular;  Laterality: N/A;   VIDEO ASSISTED THORACOSCOPY (VATS)/DECORTICATION Right 12/10/2024   Procedure: VIDEO ASSISTED THORACOSCOPY (VATS)/DECORTICATION;  Surgeon: Shyrl Linnie KIDD, MD;  Location: [facility] OR;  Service: Thoracic;  Laterality: Right;    Assessment / Plan / Recommendation Clinical Impression HPI:  44 yo male presenting 11/30 to  [facility] with swelling of LE, groin, and abdomen. Work up revealed acute HFrEF, severe MR, aortic insufficiency, and transaminates. Transferred to [facility] on 11/26/24. S/p TEE, AVR and MVR with placement of  impella on 12/8-12/24. Extubated 12/9. S/p thoracentesis 12/11 with 1000cc's of blood drained from R pleural space, rapid recollection and pt then s/p R chest tube placed 12/11 S/p coil embolization of right intercostal artery at level of right 10th rib, followed by ex lap with wound vac placement which showed patchy ischemia of ileum extending 75cm. Return to OR 12/14 with R colectomy and 12/16 for ostomy placement. To OR 12/18 for R VATS, washout, and pericardial window. Intubated 12/12-12/19, re-intubated 12/21, trach placed 12/23. Impella removed 12/16/24. Patient de-cannulated 12/28/24. PMH includes: HTN, alcohol use (5 drinks/day), tobacco use, but is limited by limited access to healthcare.   Clinical Impression:  Bedside Swallow Evaluation: A bedside swallow evaluation was completed to assess for s/sx of oropharyngeal dysphagia. Oral mechanism WFL. POs administered included thin liquids via straw and solids. Patient with timely mastication and complete oral clearance. No s/sx of aspiration present. Recommend regular/thin diet per FEES results with use of standardized precautions including sitting upright during PO and taking small bites/sips at a slow rate. No further dysphagia needs required. Cognitive-Linguistic: Patient was evaluated via the Cognistat to assess cognitive linguistic functioning. Patient scored WFL on all subtests of evaluation and reports no cognitive linguistic changes. Patient recently decannulated, however is 100% intelligible at the conversational level.  No further ST services warranted as patient is at baseline level of cognitive linguistic functioning.    Skilled Therapeutic Interventions          Patient evaluated using a standardized cognitive linguistic assessment and bedside swallow evaluation to assess current cognitive, communicative and swallowing function. See above for details.    SLP Assessment  Patient does not need any further Speech Lanaguage Pathology Services     Recommendations  SLP Diet Recommendations: Age appropriate regular solids;Thin Liquid Administration via: Cup;Straw Medication Administration: Whole meds with liquid Supervision: Patient able to self feed Postural Changes and/or Swallow Maneuvers: Seated upright 90 degrees Oral Care Recommendations: Oral care BID Patient destination: Home Follow up Recommendations: None Equipment Recommended: None recommended by SLP     Pain denies  SLP Evaluation Cognition Overall Cognitive Status: Within Functional Limits for tasks assessed Arousal/Alertness: Awake/alert Orientation Level: Oriented X4 Year: 2026 Month: January Day of Week: Correct Attention: Focused;Sustained Focused Attention: Appears intact Sustained Attention: Appears intact Memory: Appears intact Awareness: Appears intact Problem Solving: Appears intact Safety/Judgment: Appears intact  Comprehension Auditory Comprehension Overall Auditory Comprehension: Appears within functional limits for tasks assessed Yes/No Questions: Within Functional Limits Commands: Within Functional Limits Conversation: Complex Expression Expression Primary Mode of Expression: Verbal Verbal Expression Overall Verbal Expression: Appears within functional limits for tasks assessed Initiation: No impairment Repetition: No impairment Naming: No impairment Pragmatics: No impairment Oral Motor Oral Motor/Sensory Function Overall Oral Motor/Sensory Function: Within functional limits Motor Speech Overall Motor Speech: Appears within functional limits for tasks assessed Respiration: Within functional limits Phonation: Normal Resonance: Within functional limits Articulation: Within functional limitis Intelligibility: Intelligible  Care Tool Care Tool Cognition Ability to hear (with hearing aid or hearing appliances if normally used Ability to hear (with hearing aid or hearing appliances if normally used): 0. Adequate - no difficulty  in normal conservation, social interaction, listening to TV   Expression of Ideas and Wants Expression of Ideas and Wants: 4. Without difficulty (complex and basic) -  expresses complex messages without difficulty and with speech that is clear and easy to understand   Understanding Verbal and Non-Verbal Content Understanding Verbal and Non-Verbal Content: 4. Understands (complex and basic) - clear comprehension without cues or repetitions  Memory/Recall Ability Memory/Recall Ability : Current season;Location of own room;Staff names and faces;That he or she is in a hospital/hospital unit    Bedside Swallowing Assessment General Previous Swallow Assessment: 12/29 FEES Diet Prior to this Study: Thin liquids (Level 0);Regular Respiratory Status: Room air History of Recent Intubation: Yes Total duration of intubation (days): 13 days Behavior/Cognition: Alert;Cooperative;Pleasant mood Oral Cavity - Dentition: Adequate natural dentition Self-Feeding Abilities: Able to feed self Vision: Functional for self-feeding Patient Positioning: Upright in bed Volitional Cough: Weak Volitional Swallow: Able to elicit  Ice Chips Ice chips: Not tested Thin Liquid Thin Liquid: Within functional limits Presentation: Self Fed;Straw Nectar Thick Nectar Thick Liquid: Not tested Honey Thick Honey Thick Liquid: Not tested Puree Puree: Not tested Solid Solid: Within functional limits Presentation: Self Fed BSE Assessment Risk for Aspiration Impact on safety and function: No limitations    Recommendations for other services: None   Discharge Criteria: Patient will be discharged from SLP if patient refuses treatment 3 consecutive times without medical reason, if treatment goals not met, if there is a change in medical status, if patient makes no progress towards goals or if patient is discharged from hospital.  The above assessment, treatment plan, treatment alternatives and goals were discussed and  mutually agreed upon: by patient  Davanee Klinkner M.A., CCC-SLP 12/29/2024, 9:30 AM

## 2024-12-29 NOTE — Progress Notes (Addendum)
 ANTICOAGULATION CONSULT NOTE  Pharmacy Consult for warfarin Indication: bA/MVR, new AFL  Allergies[1]  Patient Measurements: Height: 5' 10 (177.8 cm) Weight: 63 kg (138 lb 14.2 oz) IBW/kg (Calculated) : 73 Heparin  Dosing Weight: 70 kg   Vital Signs: Temp: 97.5 F (36.4 C) (01/06 0812) Temp Source: Oral (01/06 0812) BP: 102/74 (01/06 0812) Pulse Rate: 109 (01/06 0812)  Labs: Recent Labs    12/27/24 0126 12/28/24 0119 12/29/24 0538 12/29/24 0945  HGB 7.9* 8.3* 8.2*  --   HCT 24.1* 25.4* 24.5*  --   PLT 428* 424* 421*  --   LABPROT 14.5 14.5  --  15.6*  INR 1.1 1.1  --  1.2  CREATININE 0.55* 0.58* 0.72  --     Estimated Creatinine Clearance: 103.9 mL/min (by C-G formula based on SCr of 0.72 mg/dL).  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  Postop course complicated by ischemic bowel s/p exlap 12/11 with partial small bowel resection, abdomen left open.  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 Impella 5.5 removed 12/24.  Was on TPN 12/12>>12/29.   Per Dr. Lucas, plan for 3 months of OAC with dual valve replacement. Not on anticoagulation prior to admission.  Pharmacy consulted for heparin  and warfarin dosing. Heparin  infusion paused 1/3 AM after BRBPR; another BRBM 1/4 PM per RN.  No plans to restart heparin  infusion per Dr. Lucas - plan to let INR trend up.  12/28/24:  INR 1.2 slightly up today but remains subtherapeutic s/p 7 doses of warfarin. CBC stable. Ostomy output slowing, appetite improving some, remains on TF.  Per RD, plan to switch to nocturnal feeds with hopes of improved PO intake and eventual discontinuation Notable DDIs - TPN off since 12/29, TF ongoing (increased nocturnal feeds 12/31).  S/p CRO x7d (last dose 1/5), PO amiodarone  BID  Goal of Therapy:  INR 2-3 Monitor platelets by anticoagulation  protocol: Yes   Plan:  Warfarin 10 mg x1 today - anticipate INR will trend up faster as TF's weaned Daily INR, CBC, and s/sx of bleeding F/u TF plans and PO intake  Thank you for allowing pharmacy to participate in this patient's care,  Maurilio Fila, PharmD Clinical Pharmacist 12/29/2024  12:01 PM      [1]  Allergies Allergen Reactions   Porcine (Pork) Protein-Containing Drug Products Other (See Comments)

## 2024-12-29 NOTE — Consult Note (Signed)
 WOC Follow-up ostomy teaching, left booklet for ileostomy in the room, patient was having RT treatment, will place on follow up for tomorrow to continue teaching, in communication with providers this morning and surgical PA would like switch over to moist to dry gauze dressing as VAC orders had been discontinued and nursing staff placed gauze dressing instead, placed order to include Vashe as an antimicrobial solution to keep surface bioburden stable with gauze dressing. Patient now in Rehab unit. Did not change out ostomy because primary nurse changed it out this morning due to leaking. Reviewed need for more supplies to include barrier rings and reviewed each step in reapplying a new appliance with nurse.   Plan: VAC therapy discontinued Ostomy teaching to continue   Please reconsult if wound worsens in condition and notify provider.   Sherrilyn Hals MSN RN CWOCN WOC [facility]  [phone] (Available from 7-3 pm Mon-Friday)

## 2024-12-29 NOTE — Progress Notes (Signed)
 "                                                        PROGRESS NOTE   Subjective/Complaints:  Pt states it is hard to swallow with feeding tube in , doesn't eat pork so sent back breakfast   ROS- neg CP, SOB, N/V/D  Objective:   No results found. Recent Labs    12/28/24 0119 12/29/24 0538  WBC 11.8* 11.2*  HGB 8.3* 8.2*  HCT 25.4* 24.5*  PLT 424* 421*   Recent Labs    12/28/24 0119 12/29/24 0538  NA 129* 125*  K 5.3* 5.2*  CL 97* 93*  CO2 20* 22  GLUCOSE 141* 115*  BUN 17 22*  CREATININE 0.58* 0.72  CALCIUM  9.0 9.4    Intake/Output Summary (Last 24 hours) at 12/29/2024 0757 Last data filed at 12/29/2024 0735 Gross per 24 hour  Intake 1176 ml  Output 1450 ml  Net -274 ml     Wound 12/05/24 1000 Pressure Injury Buttocks Mid Deep Tissue Pressure Injury - Purple or maroon localized area of discolored intact skin or blood-filled blister due to damage of underlying soft tissue from pressure and/or shear. (Active)    Physical Exam: Vital Signs Blood pressure 100/63, pulse (!) 104, temperature 98.2 F (36.8 C), temperature source Oral, resp. rate 18, height 5' 10 (1.778 m), weight 63 kg, SpO2 100%.   General: No acute distress Mood and affect are appropriate Heart: Regular rate and rhythm no rubs murmurs or extra sounds CHest incision healed  Lungs: Clear to auscultation, breathing unlabored, no rales or wheezes Abdomen: Positive bowel sounds, midline incision with ABD not examined today , nondistended Extremities: No clubbing, cyanosis, or edema Skin: No evidence of breakdown, no evidence of rash Neurologic: Cranial nerves II through XII intact, motor strength is 4/5 in bilateral deltoid, bicep, tricep, grip, hip flexor, knee extensors, ankle dorsiflexor and plantar flexor   Musculoskeletal: Full range of motion in all 4 extremities. No joint swelling   Assessment/Plan: 1. Functional deficits which require 3+ hours per day of interdisciplinary therapy in a  comprehensive inpatient rehab setting. Physiatrist is providing close team supervision and 24 hour management of active medical problems listed below. Physiatrist and rehab team continue to assess barriers to discharge/monitor patient progress toward functional and medical goals  Care Tool:  Bathing              Bathing assist       Upper Body Dressing/Undressing Upper body dressing        Upper body assist      Lower Body Dressing/Undressing Lower body dressing            Lower body assist       Toileting Toileting    Toileting assist       Transfers Chair/bed transfer  Transfers assist           Locomotion Ambulation   Ambulation assist              Walk 10 feet activity   Assist           Walk 50 feet activity   Assist           Walk 150 feet activity   Assist  Walk 10 feet on uneven surface  activity   Assist           Wheelchair     Assist               Wheelchair 50 feet with 2 turns activity    Assist            Wheelchair 150 feet activity     Assist          Blood pressure 100/63, pulse (!) 104, temperature 98.2 F (36.8 C), temperature source Oral, resp. rate 18, height 5' 10 (1.778 m), weight 63 kg, SpO2 100%.  Medical Problem List and Plan: 1. Functional deficits secondary to debility after septic shock, ischemic bowel and associated medical and surgical course             -patient may shower             -ELOS/Goals: 7-10 days, mod I to supervision with PT, OT, SLP   2.  Antithrombotics: -DVT/anticoagulation:  Mechanical: Sequential compression devices, below knee Bilateral lower extremities Pharmaceutical: Coumadin              -antiplatelet therapy: N/A   3. Pain Management: Lidoderm  patch.  As needed: Tylenol  and oxycodone .    4. Mood/Behavior/Sleep: LCSW to follow for evaluation and support when available.              - Agitation:  Seroquel  25 mg  nightly   5. Neuropsych/cognition: This patient is capable of making decisions on his own behalf.   6. Skin/Wound Care: Routine pressure relief measures.  WOC consulted for ongoing management of ostomy and abdominal wound.  Monitor sites for infection.   7. Fluids/Electrolytes/Nutrition: Monitor strict I&O and daily weights. Follow up labs magnesium /CBC/CMP/INR in a.m.  -SLP/RD Consults for [place] TFs: Con't TF at current rates due to difficulty tolerating due to cramping. During the day decrease by 30% from goal and increase nightly TF by 30%. Encouraging him to eat and drink.  -Continue PPI   Dietary consult for cal ct 8. Acute biventricular HFrEF with cardiogenic shock status post:  -warfarin for 3 months, heparin  bridge. Pharmacy to dose both.-Anticoagulation held because of blood in ostomy output. INR 1.1 -GDMT - dig, spiro, losartan .  -con't amiodarone  200mg  BID              -con't colchine for pericardial effusion    9. Acute respiratory failure with hypoxia and hypercapnia status post tracheostomy:  -Trach capped x 3 days. Decannulated  -occlusive dressing to trach stoma.  -con't Yupelri  and Brovan nebs. Encourage insp spiro.    10.Sepsis with septic shock due to recurrent aspiration pneumonia -Enterobacter:              -complete 7 days of ceftriaxone ; last day 12/29/24    11. 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 Anticipate he would benefit from Creon for short gut once off TF.  -may require vitamin B12 injections.  Monitor fat-soluble vitamin levels while here.   12. Anemia of critical illness and previous ABLA from hemothorax: Monitor CBC, transfuse Hb<7       Latest Ref Rng & Units 12/29/2024    5:38 AM  12/28/2024    1:19 AM 12/27/2024  1:26 AM  CBC  WBC 4.0 - 10.5 K/uL 11.2  11.8  11.2   Hemoglobin 13.0 - 17.0 g/dL 8.2  8.3  7.9   Hematocrit 39.0 - 52.0 % 24.5  25.4  24.1   Platelets 150 - 400 K/uL 421  424  428    Stable 1/6 13. Hyponatremia: Na 129  -ensure drinking fluids with electrolytes due to high fluid needs.         Latest Ref Rng & Units 12/29/2024    5:38 AM 12/28/2024    1:19 AM 12/27/2024    1:26 AM  BMP  Glucose 70 - 99 mg/dL 884  858  872   BUN 6 - 20 mg/dL 22  17  17    Creatinine 0.61 - 1.24 mg/dL 9.27  9.41  9.44   Sodium 135 - 145 mmol/L 125  129  129   Potassium 3.5 - 5.1 mmol/L 5.2  5.3  4.9   Chloride 98 - 111 mmol/L 93  97  98   CO2 22 - 32 mmol/L 22  20  21    Calcium  8.9 - 10.3 mg/dL 9.4  9.0  8.8   Hyper K+ and hypo Na+ but with normal renal function  Wll order lokelma  , at this point it is a balance between ensuring adequate hydration and causing hyponatremia  Complicated case may need consult nephro vs IM     LOS: 1 days A FACE TO FACE EVALUATION WAS PERFORMED  Alejandro Lopez 12/29/2024, 7:57 AM     "

## 2024-12-29 NOTE — Progress Notes (Signed)
 RT came to do morning breathing tx x 2 attempts.  Med not avail- pharmacy notified earlier.  RT will check back later.

## 2024-12-29 NOTE — Progress Notes (Signed)
 [facility] Individual Statement of Services  Patient Name:  Alejandro Lopez  Date:  12/29/2024  Welcome to the [facility].  Our goal is to provide you with an individualized program based on your diagnosis and situation, designed to meet your specific needs.  With this comprehensive rehabilitation program, you will be expected to participate in at least 3 hours of rehabilitation therapies Monday-Friday, with modified therapy programming on the weekends.  Your rehabilitation program will include the following services:  Physical Therapy (PT), Occupational Therapy (OT), Speech Therapy (ST), 24 hour per day rehabilitation nursing, Therapeutic Recreaction (TR), Neuropsychology, Care Coordinator, Rehabilitation Medicine, Nutrition Services, and Pharmacy Services  Weekly team conferences will be held on Wednesday to discuss your progress.  Your Inpatient Rehabilitation Care Coordinator will talk with you frequently to get your input and to update you on team discussions.  Team conferences with you and your family in attendance may also be held.  Expected length of stay: 10-14 days  Overall anticipated outcome: Independent with device  Depending on your progress and recovery, your program may change. Your Inpatient Rehabilitation Care Coordinator will coordinate services and will keep you informed of any changes. Your Inpatient Rehabilitation Care Coordinator's name and contact numbers are listed  below.  The following services may also be recommended but are not provided by the Inpatient [facility]:  Driving Evaluations Home Health Rehabiltiation Services Outpatient Rehabilitation Services Vocational Rehabilitation   Arrangements will be made to provide these services after discharge if needed.  Arrangements include referral to agencies that provide these services.  Your insurance has been verified to be:  [facility] medicaid Your primary doctor is:   None  Pertinent information will be shared with your doctor and your insurance company.  Inpatient Rehabilitation Care Coordinator:  Rhoda Clement, KEN [phone] or ELIGAH BASQUES  Information discussed with and copy given to patient by: Clement Asberry MATSU, 12/29/2024, 1:39 PM

## 2024-12-29 NOTE — Consult Note (Signed)
 Initial Consultation Note   Patient: Alejandro Lopez FMW:[id number] DOB: Mar 20, 1981 PCP: Patient, No Pcp Per DOA: 12/28/2024 DOS: the patient was seen and examined on 12/29/2024 Primary service: Carilyn Prentice BRAVO, MD  Referring physician: Prentice Carilyn, MD Reason for consult: Worsening electrolytes  Assessment/Plan: Assessment and Plan: 19M h/o heavy alcohol use who was found to have severe MR and AR with Grade 3 diastolic dysfunction.  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  Hospital course complicated by R hemothorax, exlap for ischemic bowel, ileostomy, VATS, trach, and deconditioning.  Hyponatremia Slowly downtrending; suggests poor PO intake vs hypervolemia (which is unlikely given normal pro-BNP); suspect electrolyte abnl are related to poor PO intake -RD consulted; apprec eval/recs (agree with increased PO intake and nocturnal feeds for now; continue calorie count)  -Allow access to PO hydration and multiple reminders per RN staffing; consider increased free water  flushes per RD (will defer to their discretion)  Hyperkalemia Sepsis unlikely given nl lactic acid -Renal consulted; agree with hold losaratan and trialing lokelma  prn      TRH will sign off at present, please call us  again when needed.  HPI: Alejandro Lopez is a 44 y.o. male with past medical history of heavy alcohol use who was found to have severe MR and AR with Grade 3 diastolic dysfunction.  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  Hospital course complicated by R hemothorax, exlap for ischemic bowel, ileostomy, VATS, trach, and deconditioning..  Review of Systems: As mentioned in the history of present illness. All other systems reviewed and are negative. Past Medical History:  Diagnosis Date   Asthma    Past Surgical History:  Procedure Laterality Date   AORTIC VALVE REPLACEMENT N/A 11/30/2024   Procedure: REPLACEMENT, AORTIC VALVE, OPEN WITH INSPIRIS  RESILIA AORTIC VALVE 23mm;  Surgeon: Lucas Dorise POUR, MD;  Location: [facility] OR;  Service: Open Heart Surgery;  Laterality: N/A;   BOWEL RESECTION N/A 12/08/2024   Procedure: EXCISION, SMALL INTESTINE;  Surgeon: Ebbie Cough, MD;  Location: [facility] OR;  Service: General;  Laterality: N/A;   COLOSTOMY REVISION Right 12/06/2024   Procedure: COLECTOMY, RIGHT;  Surgeon: Tanda Locus, MD;  Location: [facility] OR;  Service: General;  Laterality: Right;   FRACTURE SURGERY     ILEOSTOMY N/A 12/08/2024   Procedure: CREATION, ILEOSTOMY END;  Surgeon: Ebbie Cough, MD;  Location[facility] OR;  Service: General;  Laterality: N/A;   INTRAOPERATIVE TRANSESOPHAGEAL ECHOCARDIOGRAM N/A 11/30/2024   Procedure: ECHOCARDIOGRAM, TRANSESOPHAGEAL, INTRAOPERATIVE;  Surgeon: Lucas Dorise POUR, MD;  Location: [facility] OR;  Service: Open Heart Surgery;  Laterality: N/A;   IR ANGIOGRAM FOLLOW UP STUDY  12/04/2024   IR ANGIOGRAM SELECTIVE EACH ADDITIONAL VESSEL  12/04/2024   IR ANGIOGRAM VISCERAL SELECTIVE  12/04/2024   IR EMBO ART  VEN HEMORR LYMPH EXTRAV  INC GUIDE ROADMAPPING  12/04/2024   IR US  GUIDE VASC ACCESS RIGHT  12/04/2024   LAPAROTOMY N/A 12/03/2024   Procedure: LAPAROTOMY, EXPLORATORY small bowel resection abthrea therapy;  Surgeon: Sebastian Moles, MD;  Location: [facility] OR;  Service: General;  Laterality: N/A;  possible bowel resection   LAPAROTOMY N/A 12/06/2024   Procedure: LAPAROTOMY, EXPLORATORY; WOUND VAC CHANGE;  Surgeon: Tanda Locus, MD;  Location: [facility] OR;  Service: General;  Laterality: N/A;   LAPAROTOMY N/A 12/08/2024   Procedure: MACARIO LIVINGS;  Surgeon: Ebbie Cough, MD;  Location: [facility] OR;  Service: General;  Laterality: N/A;   MITRAL VALVE REPLACEMENT N/A 11/30/2024   Procedure: REPLACEMENT, MITRAL VALVE WITH MITRIS RESILIA MITRAL  VALVE 27mm;  Surgeon: Lucas Dorise POUR, MD;  Location: [facility] OR;  Service: Open Heart Surgery;  Laterality: N/A;   PLACEMENT OF IMPELLA LEFT VENTRICULAR ASSIST DEVICE N/A 11/30/2024    Procedure: INSERTION, CARDIAC ASSIST DEVICE, IMPELLA;  Surgeon: Lucas Dorise POUR, MD;  Location: [facility] OR;  Service: Open Heart Surgery;  Laterality: N/A;  IMPELLA 5.5 INSERTION   REMOVAL OF IMPELLA LEFT VENTRICULAR ASSIST DEVICE Right 12/16/2024   Procedure: REMOVAL, CARDIAC ASSIST DEVICE, IMPELLA;  Surgeon: Lucas Dorise POUR, MD;  Location: [facility] OR;  Service: Open Heart Surgery;  Laterality: Right;   RIGHT HEART CATH AND CORONARY ANGIOGRAPHY N/A 11/27/2024   Procedure: RIGHT HEART CATH AND CORONARY ANGIOGRAPHY;  Surgeon: Zenaida Morene PARAS, MD;  Location: [facility] INVASIVE CV LAB;  Service: Cardiovascular;  Laterality: N/A;   TRANSESOPHAGEAL ECHOCARDIOGRAM (CATH LAB) N/A 11/27/2024   Procedure: TRANSESOPHAGEAL ECHOCARDIOGRAM;  Surgeon: Zenaida Morene PARAS, MD;  Location: [facility] INVASIVE CV LAB;  Service: Cardiovascular;  Laterality: N/A;   VIDEO ASSISTED THORACOSCOPY (VATS)/DECORTICATION Right 12/10/2024   Procedure: VIDEO ASSISTED THORACOSCOPY (VATS)/DECORTICATION;  Surgeon: Shyrl Linnie KIDD, MD;  Location: [facility] OR;  Service: Thoracic;  Laterality: Right;   Social History:  reports that he has been smoking cigarettes. He has never used smokeless tobacco. He reports current alcohol use of about 4.0 standard drinks of alcohol per week. He reports current drug use. Drug: Marijuana.  Allergies[1]  Family History  Problem Relation Age of Onset   Stroke Mother    Hypertension Mother    Diabetes Mother     Prior to Admission medications  Medication Sig Start Date End Date Taking? Authorizing Provider  acetaminophen  (TYLENOL ) 160 MG/5ML solution Place 20.3 mLs (650 mg total) into feeding tube every 6 (six) hours as needed for fever (For Fever >101). 12/28/24   Dwan Kyla HERO, PA-C  albuterol  (PROVENTIL ) (2.5 MG/3ML) 0.083% nebulizer solution Take 3 mLs (2.5 mg total) by nebulization every 4 (four) hours as needed for wheezing or shortness of breath. 12/28/24   Dwan Kyla HERO, PA-C  amiodarone   (PACERONE ) 200 MG tablet Place 1 tablet (200 mg total) into feeding tube 2 (two) times daily. 12/28/24   Dwan Kyla HERO, PA-C  arformoterol  (BROVANA ) 15 MCG/2ML NEBU Take 2 mLs (15 mcg total) by nebulization 2 (two) times daily. 12/28/24   Dwan Kyla HERO, PA-C  cefTRIAXone  (ROCEPHIN ) 1 g injection Please give 2 grams IV for one dose only at 0900 am 12/29/2024 12/29/24   Dwan Kyla HERO, PA-C  colchicine  0.6 MG tablet Place 0.5 tablets (0.3 mg total) into feeding tube daily. 12/28/24   Dwan Kyla HERO, PA-C  cyanocobalamin  (VITAMIN B12) 1000 MCG/ML injection Inject 1 mL (1,000 mcg total) into the muscle once a week. 01/04/25   Dwan Kyla HERO, PA-C  Digestive Enzyme Cartridge NOLENE) DEVI device 1 Cartridge by Does not apply route daily at 6 (six) AM. 12/29/24   Dwan Kyla HERO, PA-C  Digestive Enzyme Cartridge (RELIZORB) DEVI device 2 Cartridges by Does not apply route daily at 2 PM. 12/29/24   Dwan Kyla HERO, PA-C  digoxin  62.5 MCG TABS Place 0.0625 mg into feeding tube daily. 12/29/24   Zimmerman, Donielle M, PA-C  diphenoxylate -atropine  (LOMOTIL ) 2.5-0.025 MG tablet Place 1 tablet into feeding tube 4 (four) times daily. 12/28/24   Dwan Kyla HERO, PA-C  empagliflozin  (JARDIANCE ) 10 MG TABS tablet Take 1 tablet (10 mg total) by mouth daily. 12/29/24   Dwan Kyla M, PA-C  fiber supplement, BANATROL TF, liquid Place 60 mLs into  feeding tube 4 (four) times daily. 12/28/24   Dwan Kyla HERO, PA-C  folic acid  (FOLVITE ) 1 MG tablet Take 1 tablet (1 mg total) by mouth at bedtime. 12/28/24   Dwan Kyla HERO, PA-C  lidocaine  (LIDODERM ) 5 % Place 1 patch onto the skin daily. Remove & Discard patch within 12 hours or as directed by MD 12/28/24   Dwan Kyla HERO, PA-C  loperamide  HCl (IMODIUM ) 1 MG/7.5ML suspension Place 30 mLs (4 mg total) into feeding tube every 6 (six) hours. 12/28/24   Dwan Kyla HERO, PA-C  losartan  (COZAAR ) 25 MG tablet Place 1  tablet (25 mg total) into feeding tube daily. 12/29/24   Dwan Kyla HERO, PA-C  Morphine  Sulfate (MORPHINE , PF,) 2 MG/ML injection Inject 0.5 mLs (1 mg total) into the vein every 3 (three) hours as needed. 12/28/24   Dwan Kyla HERO, PA-C  Mouthwashes (MOUTH RINSE) LIQD solution 15 mLs by Mouth Rinse route 4 (four) times daily. 12/28/24   Dwan Kyla HERO, PA-C  Mouthwashes (MOUTH RINSE) LIQD solution 15 mLs by Mouth Rinse route as needed (oral care). 12/28/24   Zimmerman, Donielle M, PA-C  Multiple Vitamin (MULTIVITAMIN WITH MINERALS) TABS tablet Place 1 tablet into feeding tube daily. 12/29/24   Dwan Kyla HERO, PA-C  Nutritional Supplements (FEEDING SUPPLEMENT, VITAL 1.5 CAL,) LIQD Place 1,000 mLs into feeding tube continuous. Run at 45 ml/hr from 0600 to 1800 12/28/24   Dwan Kyla M, PA-C  Nutritional Supplements (FEEDING SUPPLEMENT, VITAL 1.5 CAL,) LIQD Place 1,000 mLs into feeding tube continuous. Run at 75 ml/hr from 1800 to 0600 am 12/28/24   Dwan Kyla M, PA-C  oxyCODONE  (OXY IR/ROXICODONE ) 5 MG immediate release tablet Place 1-2 tablets (5-10 mg total) into feeding tube every 3 (three) hours as needed for moderate pain (pain score 4-6) or severe pain (pain score 7-10). 12/28/24   Dwan Kyla HERO, PA-C  pantoprazole  (PROTONIX ) 40 MG injection Inject 40 mg into the vein daily. 12/29/24   Dwan Kyla HERO, PA-C  polycarbophil (FIBERCON) 625 MG tablet Place 1 tablet (625 mg total) into feeding tube daily. 12/29/24   Zimmerman, Donielle M, PA-C  polyethylene glycol (MIRALAX  / GLYCOLAX ) 17 g packet Place 17 g into feeding tube daily as needed for mild constipation or moderate constipation. 12/28/24   Dwan Kyla HERO, PA-C  Protein (FEEDING SUPPLEMENT, PROSOURCE TF20,) liquid Place 60 mLs into feeding tube 2 (two) times daily. 12/28/24   Dwan Kyla HERO, PA-C  QUEtiapine  (SEROQUEL ) 25 MG tablet Place 1 tablet (25 mg total) into feeding tube at bedtime. 12/28/24    Dwan Kyla HERO, PA-C  revefenacin  (YUPELRI ) 175 MCG/3ML nebulizer solution Take 3 mLs (175 mcg total) by nebulization daily. 12/29/24   Dwan Kyla HERO, PA-C  scopolamine  (TRANSDERM-SCOP) 1 MG/3DAYS Place 1 patch (1 mg total) onto the skin every 3 (three) days. 12/29/24   Dwan Kyla HERO, PA-C  simethicone  (MYLICON) 40 MG/0.6ML drops Place 0.6 mLs (40 mg total) into feeding tube 4 (four) times daily as needed for flatulence. 12/28/24   Dwan Kyla HERO, PA-C  thiamine  (VITAMIN B-1) 100 MG tablet Place 1 tablet (100 mg total) into feeding tube at bedtime. 12/28/24   Dwan Kyla HERO, PA-C  Water  For Irrigation, Sterile (FREE WATER ) SOLN Place 100 mLs into feeding tube every 4 (four) hours. 12/28/24   Dwan Kyla HERO, PA-C    Physical Exam: Vitals:   12/29/24 0610 12/29/24 0812 12/29/24 1525 12/29/24 1641  BP: 100/63 102/74 101/68 99/64  Pulse: (!) 104 ROLLEN)  109 (!) 110 (!) 104  Resp: 18 16 18 18   Temp: 98.2 F (36.8 C) (!) 97.5 F (36.4 C) 98.2 F (36.8 C) 98.4 F (36.9 C)  TempSrc: Oral Oral Oral Oral  SpO2: 100% 100% 100% 100%  Weight:      Height:       General: Alert, oriented x3, resting comfortably in no acute distress Respiratory: Lungs clear to auscultation bilaterally with normal respiratory effort; no w/r/r Cardiovascular: Regular rate and rhythm w/o m/r/g   Data Reviewed:   Lab Results  Component Value Date   WBC 11.2 (H) 12/29/2024   HGB 8.2 (L) 12/29/2024   HCT 24.5 (L) 12/29/2024   MCV 88.4 12/29/2024   PLT 421 (H) 12/29/2024   Lab Results  Component Value Date   GLUCOSE 115 (H) 12/29/2024   CALCIUM  9.4 12/29/2024   NA 125 (L) 12/29/2024   K 5.2 (H) 12/29/2024   CO2 22 12/29/2024   CL 93 (L) 12/29/2024   BUN 22 (H) 12/29/2024   CREATININE 0.72 12/29/2024   Lab Results  Component Value Date   ALT 116 (H) 12/29/2024   AST 50 (H) 12/29/2024   GGT 145 (H) 11/22/2024   ALKPHOS 350 (H) 12/29/2024   BILITOT 0.8 12/29/2024   Lab  Results  Component Value Date   INR 1.2 12/29/2024   INR 1.1 12/28/2024   INR 1.1 12/27/2024   Radiology: No results found.     Family Communication: N/A Primary team communication: N/A   Thank you very much for involving us  in the care of your patient.   ------- I spent 45 minutes reviewing previous notes, at the bedside counseling/discussing the treatment plan, and performing clinical documentation.  Author: Marsha Ada, MD 12/29/2024 7:17 PM  For on call review [email or web address].     [1]  Allergies Allergen Reactions   Porcine (Pork) Protein-Containing Drug Products Other (See Comments)

## 2024-12-29 NOTE — Progress Notes (Signed)
 Initial Nutrition Assessment  DOCUMENTATION CODES:   Not applicable  INTERVENTION:  Continue tube feeding via Cortrak: transition to nocturnal feeds to promote better PO intake during daytime, will reassess in a few days if can be discontinued Vital 1.5 at 70 ml/h x 10h/night (1800-0400) (700 ml per day) Prosource TF20 60 ml BID Provides 1210 kcal (meets 57% calorie needs), 87 gm protein (meets 91% protein needs), 534 ml free water  daily (total water  daily= 834 ml) FWF: 50 ml q4h (300 ml daily)  Continue Relizorb cartridges with nocturnal feeds Recommended RELiZORB regimen: each cartridge covers 500 ml of formula Total formula per 24 hr: 700 ml Number of cartridges needed: 2 cartridges Timing of cartridge changes: 1800 (2 cartridges in tandem)   Continue regular diet with calorie count to assess adequate oral intake Follow up in 2-3 days to reassess PO intake and need for nocturnal feeds  Monitor ileostomy trends nighttime vs daytime to assess need for PO Creon  NUTRITION DIAGNOSIS:   Impaired nutrient utilization related to altered GI function as evidenced by other (comment) (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  GOAL:   Patient will meet greater than or equal to 90% of their needs  MONITOR:   PO intake, TF tolerance  REASON FOR ASSESSMENT:   Consult Assessment of nutrition requirement/status, Enteral/tube feeding initiation and management  ASSESSMENT:   Pt with hx of HTN, alcohol abuse (reported 5 drinks per day), and asthma. Recent admission for acute systolic heart failure with severe MR and AI. Hospital course complicated by multiple ex laps, ischemic bowel s/p small bowel resections, Impella placement, trach placement, and Cortrak dependence. Admitted to CIR for comprehensive rehab.  Hospital Timeline: 11/30 ED at [facility] with CAP, SOB, BLE edema 12/1 echo showed severe MR and AI 12/4 transferred to [facility] 12/8 TEE, AVR and MVR w/ Impella  placement 12/11 s/p emergent IR embolization, thoracentesis 12/12 s/p ex lap, bowel resection  12/15 s/p ex lap R colectomy 12/16 s/p small bowel resection (75 cm distal ileum w/ R hemicolectomy and ileostomy); extubated 12/19 first Cortrak placement 12/21 reintubated and ileus present 12/23 s/p trach 12/24 Impella removed 12/26 Cortrak replaced 1/5 de-cannulated and trach removed, passed swallow diet advanced to regular  CIR Admission: 1/5 admitted   Pt previously on daytime and nocturnal regimen for tube feeds requiring Relizorb cartridge use which has improved ileostomy output. Plan to trial nocturnal feeds with Relizorb and daytime PO to monitor trends of ileostomy output and assess if Creon is needed for PO intake.   Pt reports feeling like he gets full quickly with daytime tube feeds plus oral meals. Discussed transition to nocturnal feeds to meet 50% of estimated needs to allow pt to feel hungry during the day and improve PO intake. Per meal tickets collected, pt currently only eating bites at meals but drinking all fluids. Discussed with pt that Cortrak should not impede ability to swallow and encouraged better PO intake during day so Cortrak can be taken out.   Ileostomy output within recommended range, 600 ml output in last 24 h. Pt receiving lipase enzymes via Relizorb with tube feeds which has helped decrease ileostomy output. Will trend ileostomy output from nighttime following tube feeds vs daytime following PO intake to assess if Creon with meals is necessary. Pt may not need enzymes long term but may take time for pt's body to recover given complicated hospital course. Pt at high risk for multiple micronutrient deficiencies, currently ordered B12 injections weekly but should  monitor zinc, vitamin D, E, A, and K. Plan to test status prior to discharge.  Pt's weight continues to trend down, pt unsure of UBW which makes it difficult to determine if decrease is related to previous  diuresis or actual weight loss which would be significant. Will continue to monitor and will conduct nutrition focused physical exam at follow up to assess fat and muscle stores.    Nutritionally Relevant Medications: Vitamin B12 weekly injection Colchicine  Lomotil  QID Jardiance  Folic acid  daily Imodium  QID MVI w/ minerals Protonix  Lokelma  Thiamine  100 mg daily   Labs reviewed: Sodium 125/Cl 93 Potassium 5.2 BUN 22 No recent CBG A1c 6.1  Admit weight: 76 kg (at initial admission 12/5) Current weight: 63 kg  Weight Trends: 12/5 76 kg 12/12 75.5 kg 12/24 79.3 kg 12/30 68.2 kg 1/5 64.2 kg  *weight continues to trend down. Pt came in with edema but appears to be dry now. Weight currently at lowest point and pt unsure of usual body weight. Will continue to monitor*  NUTRITION - FOCUSED PHYSICAL EXAM:  Deferred to follow up, pt receiving RT treatment and wound care at time of assessment.  Diet Order:   Diet Order             Diet regular Room service appropriate? Yes with Assist; Fluid consistency: Thin  Diet effective now                   EDUCATION NEEDS:   Education needs have been addressed  Skin:  Skin Integrity Issues:: DTI, Incisions, Other (Comment) DTI: buttocks Incisions: chest, abdomen Other: ileostomy  Last BM:  600 ml x 24 h ileostomy output  Height:   Ht Readings from Last 1 Encounters:  12/28/24 5' 10 (1.778 m)    Weight:   Wt Readings from Last 1 Encounters:  12/29/24 63 kg    Ideal Body Weight:  75.45 kg  BMI:  Body mass index is 19.93 kg/m.  Estimated Nutritional Needs:   Kcal:  2100-2400  Protein:  95-125g  Fluid:  > 2L    Josette Glance, MS, RDN, LDN Clinical Dietitian I Please reach out via secure chat

## 2024-12-29 NOTE — Plan of Care (Signed)
" °  Problem: RH Balance Goal: LTG Patient will maintain dynamic standing with ADLs (OT) Description: LTG:  Patient will maintain dynamic standing balance with assist during activities of daily living (OT)  Flowsheets (Taken 12/29/2024 1244) LTG: Pt will maintain dynamic standing balance during ADLs with: Independent with assistive device   Problem: RH Eating Goal: LTG Patient will perform eating w/assist, cues/equip (OT) Description: LTG: Patient will perform eating with assist, with/without cues using equipment (OT) Flowsheets (Taken 12/29/2024 1244) LTG: Pt will perform eating with assistance level of: Independent with assistive device    Problem: RH Grooming Goal: LTG Patient will perform grooming w/assist,cues/equip (OT) Description: LTG: Patient will perform grooming with assist, with/without cues using equipment (OT) Flowsheets (Taken 12/29/2024 1244) LTG: Pt will perform grooming with assistance level of: Independent with assistive device    Problem: RH Bathing Goal: LTG Patient will bathe all body parts with assist levels (OT) Description: LTG: Patient will bathe all body parts with assist levels (OT) Flowsheets (Taken 12/29/2024 1244) LTG: Pt will perform bathing with assistance level/cueing: Independent with assistive device    Problem: RH Dressing Goal: LTG Patient will perform upper body dressing (OT) Description: LTG Patient will perform upper body dressing with assist, with/without cues (OT). Flowsheets (Taken 12/29/2024 1244) LTG: Pt will perform upper body dressing with assistance level of: Independent with assistive device Goal: LTG Patient will perform lower body dressing w/assist (OT) Description: LTG: Patient will perform lower body dressing with assist, with/without cues in positioning using equipment (OT) Flowsheets (Taken 12/29/2024 1244) LTG: Pt will perform lower body dressing with assistance level of: Independent with assistive device   Problem: RH Toileting Goal: LTG  Patient will perform toileting task (3/3 steps) with assistance level (OT) Description: LTG: Patient will perform toileting task (3/3 steps) with assistance level (OT)  Flowsheets (Taken 12/29/2024 1244) LTG: Pt will perform toileting task (3/3 steps) with assistance level: Independent with assistive device   Problem: RH Toilet Transfers Goal: LTG Patient will perform toilet transfers w/assist (OT) Description: LTG: Patient will perform toilet transfers with assist, with/without cues using equipment (OT) Flowsheets (Taken 12/29/2024 1244) LTG: Pt will perform toilet transfers with assistance level of: Independent with assistive device   Problem: RH Tub/Shower Transfers Goal: LTG Patient will perform tub/shower transfers w/assist (OT) Description: LTG: Patient will perform tub/shower transfers with assist, with/without cues using equipment (OT) Flowsheets (Taken 12/29/2024 1244) LTG: Pt will perform tub/shower stall transfers with assistance level of: Independent with assistive device   "

## 2024-12-29 NOTE — Consult Note (Signed)
 [facility]  HISTORY AND PHYSICAL  Alejandro Lopez is an 44 y.o. male.    Chief Complaint: rehab needs  HPI: Pt is a 65M with a PMH sig for heavy EtOH use and HTN who was initially admitted to [facility] for severe LE swelling up to the abdomen.    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  Hospital course complicated by R hemothorax, exlap for ischemic bowel, ileostomy, VATS, trach, and deconditioning.  Has been transferred to CIR for rehab needs.    We are consulted for hyponatremia and hyperkalemia.    Cr is WNL. On losartan , Jardiance , banatrol, digoxin .  Got lokelma  today x 1.  Pressures are in the 80s-100s.  Not taking in a lot of fluids/ PO.  Has coretrak.  Na slowly trending down, was 132 on 12/31, was 129 12/28/24 and is now 125 today.  In this setting we are sked to see.    PMH: Past Medical History:  Diagnosis Date   Asthma    PSH: Past Surgical History:  Procedure Laterality Date   AORTIC VALVE REPLACEMENT N/A 11/30/2024   Procedure: REPLACEMENT, AORTIC VALVE, OPEN WITH INSPIRIS RESILIA AORTIC VALVE 23mm;  Surgeon: Lucas Dorise POUR, MD;  Location: [facility] OR;  Service: Open Heart Surgery;  Laterality: N/A;   BOWEL RESECTION N/A 12/08/2024   Procedure: EXCISION, SMALL INTESTINE;  Surgeon: Ebbie Cough, MD;  Location: [facility] OR;  Service: General;  Laterality: N/A;   COLOSTOMY REVISION Right 12/06/2024   Procedure: COLECTOMY, RIGHT;  Surgeon: Tanda Locus, MD;  Location: [facility] OR;  Service: General;  Laterality: Right;   FRACTURE SURGERY     ILEOSTOMY N/A 12/08/2024   Procedure: CREATION, ILEOSTOMY END;  Surgeon: Ebbie Cough, MD;  Location: [facility] OR;  Service: General;  Laterality: N/A;   INTRAOPERATIVE TRANSESOPHAGEAL ECHOCARDIOGRAM N/A 11/30/2024   Procedure: ECHOCARDIOGRAM, TRANSESOPHAGEAL, INTRAOPERATIVE;  Surgeon: Lucas Dorise POUR, MD;  Location: [facility] OR;  Service:  Open Heart Surgery;  Laterality: N/A;   IR ANGIOGRAM FOLLOW UP STUDY  12/04/2024   IR ANGIOGRAM SELECTIVE EACH ADDITIONAL VESSEL  12/04/2024   IR ANGIOGRAM VISCERAL SELECTIVE  12/04/2024   IR EMBO ART  VEN HEMORR LYMPH EXTRAV  INC GUIDE ROADMAPPING  12/04/2024   IR US  GUIDE VASC ACCESS RIGHT  12/04/2024   LAPAROTOMY N/A 12/03/2024   Procedure: LAPAROTOMY, EXPLORATORY small bowel resection abthrea therapy;  Surgeon: Sebastian Moles, MD;  Location: [facility] OR;  Service: General;  Laterality: N/A;  possible bowel resection   LAPAROTOMY N/A 12/06/2024   Procedure: LAPAROTOMY, EXPLORATORY; WOUND VAC CHANGE;  Surgeon: Tanda Locus, MD;  Location: [facility] OR;  Service: General;  Laterality: N/A;   LAPAROTOMY N/A 12/08/2024   Procedure: MACARIO LIVINGS;  Surgeon: Ebbie Cough, MD;  Location: [facility] OR;  Service: General;  Laterality: N/A;   MITRAL VALVE REPLACEMENT N/A 11/30/2024   Procedure: REPLACEMENT, MITRAL VALVE WITH MITRIS RESILIA MITRAL VALVE 27mm;  Surgeon: Lucas Dorise POUR, MD;  Location: [facility] OR;  Service: Open Heart Surgery;  Laterality: N/A;   PLACEMENT OF IMPELLA LEFT VENTRICULAR ASSIST DEVICE N/A 11/30/2024   Procedure: INSERTION, CARDIAC ASSIST DEVICE, IMPELLA;  Surgeon: Lucas Dorise POUR, MD;  Location: [facility] OR;  Service: Open Heart Surgery;  Laterality: N/A;  IMPELLA 5.5 INSERTION   REMOVAL OF IMPELLA LEFT VENTRICULAR ASSIST DEVICE Right 12/16/2024   Procedure: REMOVAL, CARDIAC ASSIST DEVICE, IMPELLA;  Surgeon: Lucas Dorise POUR, MD;  Location: [facility] OR;  Service: Open Heart Surgery;  Laterality: Right;   RIGHT HEART CATH AND CORONARY ANGIOGRAPHY N/A 11/27/2024   Procedure: RIGHT HEART CATH AND CORONARY ANGIOGRAPHY;  Surgeon: Zenaida Morene PARAS, MD;  Location: [facility] INVASIVE CV LAB;  Service: Cardiovascular;  Laterality: N/A;   TRANSESOPHAGEAL ECHOCARDIOGRAM (CATH LAB) N/A 11/27/2024   Procedure: TRANSESOPHAGEAL ECHOCARDIOGRAM;  Surgeon: Zenaida Morene PARAS, MD;  Location: [facility] INVASIVE CV LAB;  Service:  Cardiovascular;  Laterality: N/A;   VIDEO ASSISTED THORACOSCOPY (VATS)/DECORTICATION Right 12/10/2024   Procedure: VIDEO ASSISTED THORACOSCOPY (VATS)/DECORTICATION;  Surgeon: Shyrl Linnie KIDD, MD;  Location: [facility] OR;  Service: Thoracic;  Laterality: Right;    Past Medical History:  Diagnosis Date   Asthma     Medications:  Scheduled:  amiodarone   200 mg Per Tube BID   arformoterol   15 mcg Nebulization BID   colchicine   0.3 mg Per Tube Daily   [START ON 01/04/2025] cyanocobalamin   1,000 mcg Intramuscular Weekly   digoxin   0.0625 mg Per Tube Daily   diphenoxylate -atropine   1 tablet Per Tube QID   empagliflozin   10 mg Oral Daily   feeding supplement (PROSource TF20)  60 mL Per Tube BID   feeding supplement (VITAL 1.5 CAL)  1,000 mL Per Tube Q24H   fiber supplement (BANATROL TF)  60 mL Per Tube QID   folic acid   1 mg Oral QHS   free water   50 mL Per Tube Q4H   lidocaine   1 patch Transdermal Q24H   loperamide  HCl  4 mg Per Tube Q6H   [START ON 12/30/2024] multivitamin  15 mL Per Tube Daily   mouth rinse  15 mL Mouth Rinse 4 times per day   pantoprazole  (PROTONIX ) IV  40 mg Intravenous Daily   polycarbophil  625 mg Per Tube Daily   QUEtiapine   25 mg Per Tube QHS   Relizorb  2 Cartridge Does not apply Q0600   revefenacin   175 mcg Nebulization Daily   scopolamine   1 patch Transdermal Q72H   thiamine   100 mg Per Tube QHS   warfarin  10 mg Oral ONCE-1600   Warfarin - Pharmacist Dosing Inpatient   Does not apply q1600    Medications Prior to Admission  Medication Sig Dispense Refill   acetaminophen  (TYLENOL ) 160 MG/5ML solution Place 20.3 mLs (650 mg total) into feeding tube every 6 (six) hours as needed for fever (For Fever >101).     albuterol  (PROVENTIL ) (2.5 MG/3ML) 0.083% nebulizer solution Take 3 mLs (2.5 mg total) by nebulization every 4 (four) hours as needed for wheezing or shortness of breath.     amiodarone  (PACERONE ) 200 MG tablet Place 1 tablet (200 mg total) into feeding  tube 2 (two) times daily.     arformoterol  (BROVANA ) 15 MCG/2ML NEBU Take 2 mLs (15 mcg total) by nebulization 2 (two) times daily.     cefTRIAXone  (ROCEPHIN ) 1 g injection Please give 2 grams IV for one dose only at 0900 am 12/29/2024     colchicine  0.6 MG tablet Place 0.5 tablets (0.3 mg total) into feeding tube daily.     [START ON 01/04/2025] cyanocobalamin  (VITAMIN B12) 1000 MCG/ML injection Inject 1 mL (1,000 mcg total) into the muscle once a week.     Digestive Enzyme Cartridge (RELIZORB) DEVI device 1 Cartridge by Does not apply route daily at 6 (six) AM.     Digestive Enzyme Cartridge (RELIZORB) DEVI device 2 Cartridges by Does not apply route daily at 2 PM.     digoxin  62.5 MCG  TABS Place 0.0625 mg into feeding tube daily.     diphenoxylate -atropine  (LOMOTIL ) 2.5-0.025 MG tablet Place 1 tablet into feeding tube 4 (four) times daily.     empagliflozin  (JARDIANCE ) 10 MG TABS tablet Take 1 tablet (10 mg total) by mouth daily.     fiber supplement, BANATROL TF, liquid Place 60 mLs into feeding tube 4 (four) times daily.     folic acid  (FOLVITE ) 1 MG tablet Take 1 tablet (1 mg total) by mouth at bedtime.     lidocaine  (LIDODERM ) 5 % Place 1 patch onto the skin daily. Remove & Discard patch within 12 hours or as directed by MD     loperamide  HCl (IMODIUM ) 1 MG/7.5ML suspension Place 30 mLs (4 mg total) into feeding tube every 6 (six) hours.     losartan  (COZAAR ) 25 MG tablet Place 1 tablet (25 mg total) into feeding tube daily.     Morphine  Sulfate (MORPHINE , PF,) 2 MG/ML injection Inject 0.5 mLs (1 mg total) into the vein every 3 (three) hours as needed.     Mouthwashes (MOUTH RINSE) LIQD solution 15 mLs by Mouth Rinse route 4 (four) times daily.     Mouthwashes (MOUTH RINSE) LIQD solution 15 mLs by Mouth Rinse route as needed (oral care).     Multiple Vitamin (MULTIVITAMIN WITH MINERALS) TABS tablet Place 1 tablet into feeding tube daily.     Nutritional Supplements (FEEDING SUPPLEMENT, VITAL  1.5 CAL,) LIQD Place 1,000 mLs into feeding tube continuous. Run at 45 ml/hr from 0600 to 1800     Nutritional Supplements (FEEDING SUPPLEMENT, VITAL 1.5 CAL,) LIQD Place 1,000 mLs into feeding tube continuous. Run at 75 ml/hr from 1800 to 0600 am     oxyCODONE  (OXY IR/ROXICODONE ) 5 MG immediate release tablet Place 1-2 tablets (5-10 mg total) into feeding tube every 3 (three) hours as needed for moderate pain (pain score 4-6) or severe pain (pain score 7-10).     pantoprazole  (PROTONIX ) 40 MG injection Inject 40 mg into the vein daily.     polycarbophil (FIBERCON) 625 MG tablet Place 1 tablet (625 mg total) into feeding tube daily.     polyethylene glycol (MIRALAX  / GLYCOLAX ) 17 g packet Place 17 g into feeding tube daily as needed for mild constipation or moderate constipation.     Protein (FEEDING SUPPLEMENT, PROSOURCE TF20,) liquid Place 60 mLs into feeding tube 2 (two) times daily.     QUEtiapine  (SEROQUEL ) 25 MG tablet Place 1 tablet (25 mg total) into feeding tube at bedtime.     revefenacin  (YUPELRI ) 175 MCG/3ML nebulizer solution Take 3 mLs (175 mcg total) by nebulization daily.     scopolamine  (TRANSDERM-SCOP) 1 MG/3DAYS Place 1 patch (1 mg total) onto the skin every 3 (three) days.     simethicone  (MYLICON) 40 MG/0.6ML drops Place 0.6 mLs (40 mg total) into feeding tube 4 (four) times daily as needed for flatulence.     thiamine  (VITAMIN B-1) 100 MG tablet Place 1 tablet (100 mg total) into feeding tube at bedtime.     Water  For Irrigation, Sterile (FREE WATER ) SOLN Place 100 mLs into feeding tube every 4 (four) hours.      ALLERGIES:  Allergies[1]  FAM HX: Family History  Problem Relation Age of Onset   Stroke Mother    Hypertension Mother    Diabetes Mother     Social History:   reports that he has been smoking cigarettes. He has never used smokeless tobacco. He reports current alcohol use  of about 4.0 standard drinks of alcohol per week. He reports current drug use. Drug:  Marijuana.  ROS: ROS: all other systems are negative except as per HPI  Blood pressure 102/74, pulse (!) 109, temperature (!) 97.5 F (36.4 C), temperature source Oral, resp. rate 16, height 5' 10 (1.778 m), weight 63 kg, SpO2 100%. PHYSICAL EXAM: Physical Exam GEN nad HEENT eomi perrl NECK flat neck veins PULM clear CV RRR, crisp S1S2 ABD  soft, nondistended, abd dressing in place, ostomy with mod liquid stool EXT no LE edema NEURO AAO x 3 SKIN no rashes MSK sarcopenic   Results for orders placed or performed during the hospital encounter of 12/28/24 (from the past 48 hours)  Comprehensive metabolic panel     Status: Abnormal   Collection Time: 12/29/24  5:38 AM  Result Value Ref Range   Sodium 125 (L) 135 - 145 mmol/L   Potassium 5.2 (H) 3.5 - 5.1 mmol/L   Chloride 93 (L) 98 - 111 mmol/L   CO2 22 22 - 32 mmol/L   Glucose, Bld 115 (H) 70 - 99 mg/dL    Comment: Glucose reference range applies only to samples taken after fasting for at least 8 hours.   BUN 22 (H) 6 - 20 mg/dL   Creatinine, Ser 9.27 0.61 - 1.24 mg/dL   Calcium  9.4 8.9 - 10.3 mg/dL   Total Protein 7.9 6.5 - 8.1 g/dL   Albumin  2.9 (L) 3.5 - 5.0 g/dL   AST 50 (H) 15 - 41 U/L   ALT 116 (H) 0 - 44 U/L   Alkaline Phosphatase 350 (H) 38 - 126 U/L   Total Bilirubin 0.8 0.0 - 1.2 mg/dL   GFR, Estimated >39 >39 mL/min    Comment: (NOTE) Calculated using the CKD-EPI Creatinine Equation (2021)    Anion gap 11 5 - 15    Comment: Performed at [facility], [address] [place], [place] [address]  CBC with Differential/Platelet     Status: Abnormal   Collection Time: 12/29/24  5:38 AM  Result Value Ref Range   WBC 11.2 (H) 4.0 - 10.5 K/uL   RBC 2.77 (L) 4.22 - 5.81 MIL/uL   Hemoglobin 8.2 (L) 13.0 - 17.0 g/dL   HCT 75.4 (L) 60.9 - 47.9 %   MCV 88.4 80.0 - 100.0 fL   MCH 29.6 26.0 - 34.0 pg   MCHC 33.5 30.0 - 36.0 g/dL   RDW 82.9 (H) 88.4 - 84.4 %   Platelets 421 (H) 150 - 400 K/uL   nRBC 0.4 (H) 0.0  - 0.2 %   Neutrophils Relative % 41 %   Neutro Abs 4.7 1.7 - 7.7 K/uL   Lymphocytes Relative 30 %   Lymphs Abs 3.3 0.7 - 4.0 K/uL   Monocytes Relative 22 %   Monocytes Absolute 2.5 (H) 0.1 - 1.0 K/uL   Eosinophils Relative 4 %   Eosinophils Absolute 0.4 0.0 - 0.5 K/uL   Basophils Relative 1 %   Basophils Absolute 0.1 0.0 - 0.1 K/uL   Immature Granulocytes 2 %   Abs Immature Granulocytes 0.19 (H) 0.00 - 0.07 K/uL    Comment: Performed at [facility], [address] [place], [place] [address]  Magnesium      Status: None   Collection Time: 12/29/24  5:38 AM  Result Value Ref Range   Magnesium  2.0 1.7 - 2.4 mg/dL    Comment: Performed at [facility], [address]  [place], [place] [address]  Protime-INR     Status: Abnormal   Collection Time: 12/29/24  9:45 AM  Result Value Ref Range   Prothrombin Time 15.6 (H) 11.4 - 15.2 seconds   INR 1.2 0.8 - 1.2    Comment: (NOTE) INR goal varies based on device and disease states. Performed at [facility], [address], [place], [place] [address]     No results found.  Assessment/Plan  Hyponatremia: drifting down over course of hospitalization, now 125.  On TF.    - check urine and serum osms  - check cortisol and TSH  - doesn't look overloaded anymore, may be dry-- want to get data before fluid administration  2.  Hyperkalemia:  - looks like multifactorial  - temporarily stop losartan  esp in setting of soft pressures--> may need to restart but will hold for now  - note banatrol has sig potassium in it (seems to need it for ostomy output)  - gave lokelma  today  - discussed high K diet  3.  S/p bioprosthetic MVR/AVR  - on coumadin    4.  S/p trach  - decannulated now  5.  Protein calorie malnutrition  - on TF, have changed them to night per pt  6.  Dispo: pending  GEARLINE NORRIS 12/29/2024, 1:36 PM       [1]  Allergies Allergen Reactions   Porcine (Pork) Protein-Containing Drug Products Other  (See Comments)

## 2024-12-30 ENCOUNTER — Ambulatory Visit: Payer: Self-pay

## 2024-12-30 DIAGNOSIS — E871 Hypo-osmolality and hyponatremia: Secondary | ICD-10-CM | POA: Diagnosis not present

## 2024-12-30 DIAGNOSIS — Z932 Ileostomy status: Secondary | ICD-10-CM | POA: Diagnosis not present

## 2024-12-30 DIAGNOSIS — R5381 Other malaise: Secondary | ICD-10-CM | POA: Diagnosis not present

## 2024-12-30 DIAGNOSIS — E875 Hyperkalemia: Secondary | ICD-10-CM | POA: Diagnosis not present

## 2024-12-30 LAB — PROTIME-INR
INR: 1.3 — ABNORMAL HIGH (ref 0.8–1.2)
Prothrombin Time: 16.4 s — ABNORMAL HIGH (ref 11.4–15.2)

## 2024-12-30 LAB — URINALYSIS, ROUTINE W REFLEX MICROSCOPIC
Bacteria, UA: NONE SEEN
Bilirubin Urine: NEGATIVE
Glucose, UA: >=500 mg/dL — AB
Hgb urine dipstick: NEGATIVE
Ketones, ur: NEGATIVE mg/dL
Leukocytes,Ua: NEGATIVE
Nitrite: NEGATIVE
Protein, ur: NEGATIVE mg/dL
Specific Gravity, Urine: 1.015 (ref 1.005–1.030)
pH: 5 (ref 5.0–8.0)

## 2024-12-30 LAB — RETICULOCYTES
Immature Retic Fract: 35.4 % — ABNORMAL HIGH (ref 2.3–15.9)
RBC.: 2.93 MIL/uL — ABNORMAL LOW (ref 4.22–5.81)
Retic Count, Absolute: 167.6 K/uL (ref 19.0–186.0)
Retic Ct Pct: 5.7 % — ABNORMAL HIGH (ref 0.4–3.1)

## 2024-12-30 LAB — CORTISOL: Cortisol, Plasma: 25.9 ug/dL

## 2024-12-30 LAB — IRON AND TIBC
Iron: 40 ug/dL — ABNORMAL LOW (ref 45–182)
Saturation Ratios: 13 % — ABNORMAL LOW (ref 17.9–39.5)
TIBC: 311 ug/dL (ref 250–450)
UIBC: 271 ug/dL

## 2024-12-30 LAB — FERRITIN: Ferritin: 4323 ng/mL — ABNORMAL HIGH (ref 24–336)

## 2024-12-30 LAB — SODIUM, URINE, RANDOM: Sodium, Ur: <30 mmol/L

## 2024-12-30 LAB — BASIC METABOLIC PANEL WITH GFR
Anion gap: 12 (ref 5–15)
BUN: 26 mg/dL — ABNORMAL HIGH (ref 6–20)
CO2: 21 mmol/L — ABNORMAL LOW (ref 22–32)
Calcium: 9.3 mg/dL (ref 8.9–10.3)
Chloride: 94 mmol/L — ABNORMAL LOW (ref 98–111)
Creatinine, Ser: 0.72 mg/dL (ref 0.61–1.24)
GFR, Estimated: >60 mL/min
Glucose, Bld: 138 mg/dL — ABNORMAL HIGH (ref 70–99)
Potassium: 5.1 mmol/L (ref 3.5–5.1)
Sodium: 127 mmol/L — ABNORMAL LOW (ref 135–145)

## 2024-12-30 LAB — VITAMIN B12: Vitamin B-12: 2976 pg/mL — ABNORMAL HIGH (ref 180–914)

## 2024-12-30 LAB — OSMOLALITY, URINE: Osmolality, Ur: 544 mosm/kg (ref 300–900)

## 2024-12-30 LAB — TSH: TSH: 2.42 u[IU]/mL (ref 0.350–4.500)

## 2024-12-30 LAB — FOLATE: Folate: >20 ng/mL

## 2024-12-30 MED ORDER — BANATROL TF EN LIQD: 60.0000 mL | Freq: Four times a day (QID) | ENTERAL | Status: DC

## 2024-12-30 MED ORDER — THIAMINE MONONITRATE 100 MG PO TABS
100.0000 mg | ORAL_TABLET | Freq: Every day | ORAL | Status: DC
  Administered 2024-12-30 – 2025-01-04 (×6): 100 mg via ORAL
  Filled 2024-12-30 (×6): qty 1

## 2024-12-30 MED ORDER — SODIUM CHLORIDE (PF) 0.9 % IJ SOLN
INTRAMUSCULAR | Status: AC
  Administered 2024-12-30: 10 mL
  Filled 2024-12-30: qty 10

## 2024-12-30 MED ORDER — OXYCODONE HCL 5 MG PO TABS
5.0000 mg | ORAL_TABLET | ORAL | Status: DC | PRN
  Administered 2024-12-31 – 2025-01-03 (×5): 10 mg via ORAL
  Filled 2024-12-30 (×5): qty 2

## 2024-12-30 MED ORDER — HYDROCERIN EX CREA
TOPICAL_CREAM | Freq: Two times a day (BID) | CUTANEOUS | Status: DC
  Filled 2024-12-30: qty 113

## 2024-12-30 MED ORDER — PROSOURCE PLUS PO LIQD
30.0000 mL | Freq: Two times a day (BID) | ORAL | Status: DC
  Administered 2024-12-30 – 2025-01-05 (×12): 30 mL via ORAL
  Filled 2024-12-30 (×11): qty 30

## 2024-12-30 MED ORDER — ADULT MULTIVITAMIN W/MINERALS CH
1.0000 | ORAL_TABLET | Freq: Every day | ORAL | Status: DC
  Administered 2024-12-31 – 2025-01-05 (×6): 1 via ORAL
  Filled 2024-12-30 (×6): qty 1

## 2024-12-30 MED ORDER — DIPHENOXYLATE-ATROPINE 2.5-0.025 MG PO TABS
1.0000 | ORAL_TABLET | Freq: Four times a day (QID) | ORAL | Status: DC
  Administered 2024-12-30 – 2025-01-05 (×23): 1 via ORAL
  Filled 2024-12-30 (×23): qty 1

## 2024-12-30 MED ORDER — PANTOPRAZOLE SODIUM 40 MG PO TBEC
40.0000 mg | DELAYED_RELEASE_TABLET | Freq: Every day | ORAL | Status: DC
  Administered 2024-12-31 – 2025-01-05 (×6): 40 mg via ORAL
  Filled 2024-12-30 (×6): qty 1

## 2024-12-30 MED ORDER — SIMETHICONE 40 MG/0.6ML PO SUSP: 40.0000 mg | Freq: Four times a day (QID) | ORAL | Status: DC | PRN

## 2024-12-30 MED ORDER — LOPERAMIDE HCL 2 MG PO CAPS
4.0000 mg | ORAL_CAPSULE | Freq: Four times a day (QID) | ORAL | Status: DC
  Administered 2024-12-30 – 2025-01-05 (×22): 4 mg via ORAL
  Filled 2024-12-30 (×22): qty 2

## 2024-12-30 MED ORDER — ACETAMINOPHEN 160 MG/5ML PO SOLN: 650.0000 mg | Freq: Four times a day (QID) | ORAL | Status: DC | PRN

## 2024-12-30 MED ORDER — LOPERAMIDE HCL 1 MG/7.5ML PO SUSP
4.0000 mg | Freq: Four times a day (QID) | ORAL | Status: DC
  Administered 2024-12-30: 4 mg via ORAL
  Filled 2024-12-30 (×2): qty 30

## 2024-12-30 MED ORDER — COLCHICINE 0.3 MG HALF TABLET
0.3000 mg | ORAL_TABLET | Freq: Every day | ORAL | Status: DC
  Administered 2024-12-30 – 2025-01-04 (×6): 0.3 mg via ORAL
  Filled 2024-12-30 (×6): qty 1

## 2024-12-30 MED ORDER — QUETIAPINE FUMARATE 25 MG PO TABS
25.0000 mg | ORAL_TABLET | Freq: Every day | ORAL | Status: DC
  Administered 2024-12-30 – 2025-01-04 (×6): 25 mg via ORAL
  Filled 2024-12-30 (×6): qty 1

## 2024-12-30 MED ORDER — ENSURE PLUS HIGH PROTEIN PO LIQD: 237.0000 mL | Freq: Two times a day (BID) | ORAL | Status: DC

## 2024-12-30 MED ORDER — AMIODARONE HCL 200 MG PO TABS
200.0000 mg | ORAL_TABLET | Freq: Two times a day (BID) | ORAL | Status: DC
  Administered 2024-12-30 – 2025-01-05 (×12): 200 mg via ORAL
  Filled 2024-12-30 (×12): qty 1

## 2024-12-30 MED ORDER — DIGOXIN 0.0625 MG HALF TABLET
0.0625 mg | ORAL_TABLET | Freq: Every day | ORAL | Status: DC
  Administered 2024-12-31: 0.0625 mg via ORAL
  Filled 2024-12-30: qty 1

## 2024-12-30 MED ORDER — WARFARIN SODIUM 2.5 MG PO TABS
12.5000 mg | ORAL_TABLET | Freq: Once | ORAL | Status: AC
  Administered 2024-12-30: 12.5 mg via ORAL
  Filled 2024-12-30: qty 1

## 2024-12-30 NOTE — Progress Notes (Signed)
 Brief Nutrition Support Note  Pt requesting Cortrak removal as he feels his appetite is poor during the day due to nocturnal feeds. Order in to remove Cortrak and discontinue tube feeds. Will monitor for improvements in pt's PO intake with ongoing calorie count. Oral supplements added to regimen to promote adequate PO intake.  Full assessment to follow.   INTERVENTIONS:  Discontinue tube feeds and remove Cortrak, discontinue Relizorb  Ensure Plus High Protein po BID, each supplement provides 350 kcal and 20 grams of protein.  ProSource Plus 30 ml BID, each supplement provides 100 kcal and 15 g protein  Monitor PO intake with ongoing calorie count and assess trends in ileostomy output with PO intake     Josette Glance, MS, RDN, LDN Clinical Dietitian I Please reach out via secure chat

## 2024-12-30 NOTE — Progress Notes (Signed)
 Physical Therapy Note  Patient Details  Name: Alejandro Lopez MRN: [id number] Date of Birth: 06-24-1981 Today's Date: 12/30/2024    Physical Therapist participated in the interdisciplinary team conference, providing clinical information regarding the patient's current status, treatment goals, and weekly focus, including any barriers that need to be addressed. Please see the Inpatient Rehabilitation Team Conference and Plan of Care Update for further details.    [name] P [name] 12/30/2024, 10:17 AM

## 2024-12-30 NOTE — IPOC Note (Signed)
 Overall Plan of Care [facility]) Patient Details Name: Alejandro Lopez MRN: [id number] DOB: 1981/05/24  Admitting Diagnosis: Debility  Hospital Problems: Principal Problem:   Debility     Functional Problem List: Nursing Bowel, Safety, Endurance, Medication Management, Pain, Skin Integrity  PT Balance, Endurance, Motor, Nutrition, Safety, Skin Integrity  OT Balance, Cognition, Endurance, Motor, Nutrition, Pain, Safety, Sensory, Skin Integrity  SLP    TR         Basic ADLs: OT Eating, Grooming, Bathing, Dressing, Toileting     Advanced  ADLs: OT None     Transfers: PT Bed Mobility, Bed to Chair, Car, [employer], Civil Service Fast Streamer, Research Scientist (life Sciences): PT Ambulation, Stairs     Additional Impairments: OT None  SLP        TR      Anticipated Outcomes Item Anticipated Outcome  Self Feeding mod I  Swallowing      Basic self-care  mod I  Toileting  mod I   Bathroom Transfers mod I  Bowel/Bladder  manage bowel w mod I assist  Transfers  mod I  Locomotion  mod I  Communication     Cognition     Pain  PAin < 4 with prns  Safety/Judgment  manage safety w cues   Therapy Plan: PT Intensity: Minimum of 1-2 x/day ,45 to 90 minutes PT Frequency: 5 out of 7 days PT Duration Estimated Length of Stay: 7-10 days OT Intensity: Minimum of 1-2 x/day, 45 to 90 minutes OT Frequency: 5 out of 7 days OT Duration/Estimated Length of Stay: 7-10 days     Team Interventions: Nursing Interventions Bowel Management, Medication Management, Patient/Family Education, Disease Management/Prevention, Discharge Planning, Skin Care/Wound Management, Pain Management  PT interventions Ambulation/gait training, Discharge planning, Functional mobility training, Psychosocial support, Therapeutic Activities, Visual/perceptual remediation/compensation, Wheelchair propulsion/positioning, Therapeutic Exercise, Skin care/wound management, Neuromuscular re-education, Disease  management/prevention, Warden/ranger, Cognitive remediation/compensation, DME/adaptive equipment instruction, Pain management, Splinting/orthotics, UE/LE Strength taining/ROM, UE/LE Coordination activities, Stair training, Metlife reintegration, Development worker, international aid stimulation, Patient/family education  OT Interventions Warden/ranger, Fish Farm Manager, Patient/family education, Therapeutic Activities, Wheelchair propulsion/positioning, Therapeutic Exercise, Functional electrical stimulation, Psychosocial support, UE/LE Strength taining/ROM, Self Care/advanced ADL retraining, Community reintegration, Functional mobility training, Discharge planning, Neuromuscular re-education, Skin care/wound managment, UE/LE Coordination activities, Splinting/orthotics, Pain management, Disease mangement/prevention  SLP Interventions    TR Interventions    SW/CM Interventions Discharge Planning, Psychosocial Support, Patient/Family Education   Barriers to Discharge MD  Medical stability  Nursing Decreased caregiver support, Home environment access/layout, Wound Care 1 level 1 ste no rails w spouse  PT Nutrition means    OT Nutrition means    SLP      SW Wound Care, Insurance for SNF coverage     Team Discharge Planning: Destination: PT-Home ,OT- Home , SLP-Home Projected Follow-up: PT-Outpatient PT, OT-  Outpatient OT, SLP-None Projected Equipment Needs: PT-To be determined, OT- To be determined, SLP-None recommended by SLP Equipment Details: PT- , OT-owns no DME Patient/family involved in discharge planning: PT- Family member/caregiver, Patient,  OT-Patient, Family member/caregiver, SLP-Patient  MD ELOS: 7-10d Medical Rehab Prognosis:  Good Assessment: The patient has been admitted for CIR therapies with the diagnosis of debility due to sepsis. The team will be addressing functional mobility, strength, stamina, balance, safety, adaptive techniques and  equipment, self-care, bowel and bladder mgt, patient and caregiver education, wound care, nutrition via NG transitioning to po. Goals have been set at mod I. Anticipated discharge destination is home.  See Team Conference Notes for weekly updates to the plan of care

## 2024-12-30 NOTE — Progress Notes (Addendum)
 "                                                        PROGRESS NOTE   Subjective/Complaints:  No airleak noted through old trach site Nursing administering meds via Feeding tube, pt states he does not eat breakfast , cal count in progress  Liquid BM x 2 yesterday  ROS- neg CP, SOB, N/V/D  Objective:   No results found. Recent Labs    12/28/24 0119 12/29/24 0538  WBC 11.8* 11.2*  HGB 8.3* 8.2*  HCT 25.4* 24.5*  PLT 424* 421*   Recent Labs    12/29/24 0538 12/30/24 0435  NA 125* 127*  K 5.2* 5.1  CL 93* 94*  CO2 22 21*  GLUCOSE 115* 138*  BUN 22* 26*  CREATININE 0.72 0.72  CALCIUM  9.4 9.3    Intake/Output Summary (Last 24 hours) at 12/30/2024 0814 Last data filed at 12/30/2024 9188 Gross per 24 hour  Intake 2529 ml  Output 2000 ml  Net 529 ml     Wound 12/28/24 1712 Pressure Injury Sacrum Medial Deep Tissue Pressure Injury - Purple or maroon localized area of discolored intact skin or blood-filled blister due to damage of underlying soft tissue from pressure and/or shear. (Active)    Physical Exam: Vital Signs Blood pressure 100/73, pulse (!) 109, temperature 97.9 F (36.6 C), temperature source Oral, resp. rate 18, height 5' 10 (1.778 m), weight 64.3 kg, SpO2 100%.  ENT- trach site close , small scab, dressing re,moved Right internal jugular site CDI dressing removed  General: No acute distress Mood and affect are appropriate Heart: Regular rate and rhythm no rubs murmurs or extra sounds CHest incision healed  Lungs: Clear to auscultation, breathing unlabored, no rales or wheezes Abdomen: Positive bowel sounds, midline incision ~12cm, midline figure of eight shaped with pink granulation tissues  Extremities: No clubbing, cyanosis, or edema Skin: No evidence of breakdown, no evidence of rash Neurologic: Cranial nerves II through XII intact, motor strength is 4/5 in bilateral deltoid, bicep, tricep, grip, hip flexor, knee extensors, ankle dorsiflexor and  plantar flexor   Musculoskeletal: Full range of motion in all 4 extremities. No joint swelling   Assessment/Plan: 1. Functional deficits which require 3+ hours per day of interdisciplinary therapy in a comprehensive inpatient rehab setting. Physiatrist is providing close team supervision and 24 hour management of active medical problems listed below. Physiatrist and rehab team continue to assess barriers to discharge/monitor patient progress toward functional and medical goals  Care Tool:  Bathing              Bathing assist Assist Level: Minimal Assistance - Patient > 75%     Upper Body Dressing/Undressing Upper body dressing        Upper body assist Assist Level: Supervision/Verbal cueing    Lower Body Dressing/Undressing Lower body dressing            Lower body assist Assist for lower body dressing: Contact Guard/Touching assist     Toileting Toileting    Toileting assist Assist for toileting: Minimal Assistance - Patient > 75%     Transfers Chair/bed transfer  Transfers assist     Chair/bed transfer assist level: Contact Guard/Touching assist     Locomotion Ambulation   Ambulation assist      Assist  level: Contact Guard/Touching assist Assistive device: No Device Max distance: 150   Walk 10 feet activity   Assist     Assist level: Contact Guard/Touching assist Assistive device: No Device   Walk 50 feet activity   Assist    Assist level: Contact Guard/Touching assist Assistive device: No Device    Walk 150 feet activity   Assist    Assist level: Contact Guard/Touching assist Assistive device: No Device    Walk 10 feet on uneven surface  activity   Assist     Assist level: Contact Guard/Touching assist     Wheelchair     Assist Is the patient using a wheelchair?: Yes Type of Wheelchair: Manual    Wheelchair assist level: Dependent - Patient 0%      Wheelchair 50 feet with 2 turns  activity    Assist        Assist Level: Dependent - Patient 0%   Wheelchair 150 feet activity     Assist      Assist Level: Dependent - Patient 0%   Blood pressure 100/73, pulse (!) 109, temperature 97.9 F (36.6 C), temperature source Oral, resp. rate 18, height 5' 10 (1.778 m), weight 64.3 kg, SpO2 100%.  Medical Problem List and Plan: 1. Functional deficits secondary to debility after septic shock, ischemic bowel and associated medical and surgical course             -patient may shower             -ELOS/Goals: 7-10 days, mod I to supervision with PT, OT, SLP  Team conference today please see physician documentation under team conference tab, met with team  to discuss problems,progress, and goals. Formulized individual treatment plan based on medical history, underlying problem and comorbidities.  2.  Antithrombotics: -DVT/anticoagulation:  Mechanical: Sequential compression devices, below knee Bilateral lower extremities Pharmaceutical: Coumadin              -antiplatelet therapy: N/A   3. Pain Management: Lidoderm  patch.  As needed: Tylenol  and oxycodone .    4. Mood/Behavior/Sleep: LCSW to follow for evaluation and support when available.              - Agitation:  Seroquel  25 mg nightly   5. Neuropsych/cognition: This patient is capable of making decisions on his own behalf.   6. Skin/Wound Care: Routine pressure relief measures.  WOC consulted for ongoing management of ostomy and abdominal wound.  Monitor sites for infection.   7. Fluids/Electrolytes/Nutrition: Monitor strict I&O and daily weights. Follow up labs magnesium /CBC/CMP/INR in a.m.  -SLP/RD Consults for Cortak TFs: Con't TF at current rates due to difficulty tolerating due to cramping. During the day decrease by 30% from goal and increase nightly TF by 30%. Encouraging him to eat and drink.  -Continue PPI   Dietary consult for cal ct 8. Acute biventricular HFrEF with cardiogenic shock status post:   -warfarin for 3 months, heparin  bridge. Pharmacy to dose both.-Anticoagulation held because of blood in ostomy output. INR 1.1 -GDMT - dig, spiro, losartan .  -con't amiodarone  200mg  BID              -con't colchine for pericardial effusion  Appreciate cards f/u    9. Acute respiratory failure with hypoxia and hypercapnia status post tracheostomy:  -Trach capped x 3 days. Decannulated  -occlusive dressing to trach stoma.  -con't Yupelri  and Brovan nebs. Encourage insp spiro.    10.Sepsis with septic shock due to recurrent aspiration pneumonia -Enterobacter:              -  complete 7 days of ceftriaxone ; last day 12/29/24    11. 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 Anticipate he would benefit from Creon for short gut once off TF.  -may require vitamin B12 injections.  Monitor fat-soluble vitamin levels while here.   12. Anemia of critical illness and previous ABLA from hemothorax: Monitor CBC, transfuse Hb<7       Latest Ref Rng & Units 12/29/2024    5:38 AM 12/28/2024    1:19 AM 12/27/2024    1:26 AM  CBC  WBC 4.0 - 10.5 K/uL 11.2  11.8  11.2   Hemoglobin 13.0 - 17.0 g/dL 8.2  8.3  7.9   Hematocrit 39.0 - 52.0 % 24.5  25.4  24.1   Platelets 150 - 400 K/uL 421  424  428    Stable 1/6 13. Hyponatremia: Na 129  -ensure drinking fluids with electrolytes due to high fluid needs.         Latest Ref Rng & Units 12/30/2024    4:35 AM 12/29/2024    5:38 AM 12/28/2024    1:19 AM  BMP  Glucose 70 - 99 mg/dL 861  884  858   BUN 6 - 20 mg/dL 26  22  17    Creatinine 0.61 - 1.24 mg/dL 9.27  9.27  9.41   Sodium 135 - 145 mmol/L 127  125  129   Potassium 3.5 - 5.1 mmol/L 5.1  5.2  5.3   Chloride 98 - 111 mmol/L 94  93  97   CO2  22 - 32 mmol/L 21  22  20    Calcium  8.9 - 10.3 mg/dL 9.3  9.4  9.0   Hyper K+ and hypo Na+ but with normal renal function  Wll order lokelma  , at this point it is a balance between ensuring adequate hydration and causing hyponatremia  Complicated case may need consult nephro and  IM     LOS: 2 days A FACE TO FACE EVALUATION WAS PERFORMED  Alejandro Lopez 12/30/2024, 8:14 AM     "

## 2024-12-30 NOTE — Progress Notes (Signed)
 Physical Therapy Session Note  Patient Details  Name: Alejandro Lopez MRN: [id number] Date of Birth: January 11, 1981  Today's Date: 12/30/2024 PT Individual Time: 0800-0912 PT Individual Time Calculation (min): 72 min   Short Term Goals: Week 1:  PT Short Term Goal 1 (Week 1): STG=LTG due to ELOS  Skilled Therapeutic Interventions/Progress Updates:      Pt presents in bed to start. Pt reporting 3/10 abdominal pain, requesting nursing to empty his ostomy bag. RN made aware who emptied bag and provided pain Rx.   Bed mobility completed mod I with hospital bed features. Pinned his coretrack feeding tube to shirt to reduce risk of catching during mobility. Pt with thick secretions (able to produce productive cough) needing to use [place] Suction before leaving his room. Due to being yellow MEWS, NT checking vitals which were WNL other than elevated resting HR of 112bpm while sitting EOB. HR monitored throughout therapy session.   Pt ambulates with RW at supervision level from his room to the main gym ~150'. Slow speed and flexed posture but no LOB or knee buckling. Pt reporting 3/10 RPE on fatigue scale. HR 120 bpm after 150' of gait.   Pt instructed in while using the RW. Pt able to completed the test without any standing or seated rest break. He covered a 35' during the test. HR measured during and post testing. During testing 135bpm and post testing 125bpm. Extended seated rest break for recovery after testing.   Pt able to continue addressing global debility by climbing x4 (6) steps with 2 hand rails at supervision level. He completes with a step-to pattern while forward facing. Has no LOB, only limited by his fatigue.   Returns to his room without a seated rest break, ~150' with supervision and RW. Pt elects to rest in bed before his next therapy. Bed mobility completed mod I. Ended treatment with his needs met.   Therapy Documentation Precautions:  Precautions Precautions: Fall,  Sternal Precaution/Restrictions Comments: Feeding tube, wound vac, ostomy Restrictions Weight Bearing Restrictions Per Provider Order: Yes RUE Weight Bearing Per Provider Order: Non weight bearing LUE Weight Bearing Per Provider Order: Non weight bearing Other Position/Activity Restrictions: sternal precautions General:      Therapy/Group: Individual Therapy  Sherlean SHAUNNA Perks 12/30/2024, 7:43 AM

## 2024-12-30 NOTE — Progress Notes (Addendum)
 "    Advanced Heart Failure Rounding Note  Cardiologist: None  Chief Complaint: Valvular Heart Disease & HFrEF Patient Profile   44 y.o. male w/ h/o heavy alcohol use (at least 5 drinks per evening) and h/o asthma admitted w/ acute systolic heart failure. Strong family history of cardiac disease: mother and maternal grandmother had heart failure and father with CAD. Admitted with acute HFrEF/cardiogenic shock. Echo w/ severe AR, MR and LV dysfunction.    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  12/9 VT/VF ? vagal episode. Extubated 12/11: S/p R thoracentesis with resultant hemothorax.  Chest tube placed. S/p R intercostal artery coil (IR). S/p exp lap for ischemic bowel resection, bowel left in discontinuity 12/15: Back to OR for ischemic R colon and mesenteric ischemia. S/p exp/lap, wound vac exchange, R colectomy without anastomosis.  12/16 Back to OR for small bowel resection and ileostomy  12/18 OR for VATS/hemothorax washout, pericardial window was not done.  12/19 extubated 12/21 Reintubated. Ileus 12/23 S/p Trach. Fever post procedure, Tmax 101. Abx switched to cefepime    12/24 Impella removed 12/25: DBA stopped. Diuretics held d/t low CVPs 12/26: Chest tube/foley out 12/31: Started 7 day course of ceftriaxone  for fevers  1/3: Bleeding from ostomy output, heparin  gtt paused  12/28/24: Trach capped and decannulated. Tx to CIR.   Subjective:    Na 125>127 K 5.2>5.1 Cl 93>94 BUN 22.26 Scr stable 0.7  Free water  flushes decreased yesterday.   Wondering when CorTrak can come out. Struggling to take in enough during the day, feels nocturnal tube feeds are inhibiting his appetite.  Objective:    Weight Range: 64.3 kg Body mass index is 20.34 kg/m.   Vital Signs:   Temp:  [97.5 F (36.4 C)-98.4 F (36.9 C)] 97.5 F (36.4 C) (01/07 0828) Pulse Rate:  [103-111] 110 (01/07 0828) Resp:  [17-18] 17 (01/07 0828) BP: (96-116)/(64-80)  116/80 (01/07 0828) SpO2:  [97 %-100 %] 100 % (01/07 0828) Weight:  [64.3 kg] 64.3 kg (01/07 0500) Last BM Date : 12/30/24  Weight change: Filed Weights   12/28/24 1733 12/29/24 0332 12/30/24 0500  Weight: 64.2 kg 63 kg 64.3 kg   Intake/Output:  Intake/Output Summary (Last 24 hours) at 12/30/2024 1112 Last data filed at 12/30/2024 0831 Gross per 24 hour  Intake 2069 ml  Output 1875 ml  Net 194 ml    Physical Exam  General:  Thin, chronically ill appearing ENT: + CorTrak Neck: No JVD Cor: Regular rate & rhythm.  Abdomen: nondistended Extremities: no edema Neuro: alert & orientedx3. Affect pleasant   Not on telemtry.  Labs    CBC Recent Labs    12/28/24 0119 12/29/24 0538  WBC 11.8* 11.2*  NEUTROABS  --  4.7  HGB 8.3* 8.2*  HCT 25.4* 24.5*  MCV 89.1 88.4  PLT 424* 421*   Basic Metabolic Panel Recent Labs    98/94/73 0119 12/29/24 0538 12/30/24 0435  NA 129* 125* 127*  K 5.3* 5.2* 5.1  CL 97* 93* 94*  CO2 20* 22 21*  GLUCOSE 141* 115* 138*  BUN 17 22* 26*  CREATININE 0.58* 0.72 0.72  CALCIUM  9.0 9.4 9.3  MG 1.5* 2.0  --    Liver Function Tests Recent Labs    12/29/24 0538  AST 50*  ALT 116*  ALKPHOS 350*  BILITOT 0.8  PROT 7.9  ALBUMIN  2.9*    ProBNP (last 3 results) Recent Labs    11/22/24 1958  12/29/24 1445  PROBNP 3,354.0* 282.0   Fasting Lipid Panel No results for input(s): CHOL, HDL, LDLCALC, TRIG, CHOLHDL, LDLDIRECT in the last 72 hours.  Medications:    Scheduled Medications:  amiodarone   200 mg Per Tube BID   arformoterol   15 mcg Nebulization BID   colchicine   0.3 mg Per Tube Daily   [START ON 01/04/2025] cyanocobalamin   1,000 mcg Intramuscular Weekly   digoxin   0.0625 mg Per Tube Daily   diphenoxylate -atropine   1 tablet Per Tube QID   empagliflozin   10 mg Oral Daily   feeding supplement (PROSource TF20)  60 mL Per Tube BID   feeding supplement (VITAL 1.5 CAL)  1,000 mL Per Tube Q24H   fiber supplement (BANATROL  TF)  60 mL Per Tube QID   folic acid   1 mg Oral QHS   free water   50 mL Per Tube Q4H   hydrocerin   Topical BID   lidocaine   1 patch Transdermal Q24H   loperamide  HCl  4 mg Per Tube Q6H   multivitamin  15 mL Per Tube Daily   mouth rinse  15 mL Mouth Rinse 4 times per day   pantoprazole  (PROTONIX ) IV  40 mg Intravenous Daily   polycarbophil  625 mg Per Tube Daily   QUEtiapine   25 mg Per Tube QHS   Relizorb  2 Cartridge Does not apply Q0600   revefenacin   175 mcg Nebulization Daily   scopolamine   1 patch Transdermal Q72H   thiamine   100 mg Per Tube QHS   Warfarin - Pharmacist Dosing Inpatient   Does not apply q1600    Infusions:    PRN Medications: acetaminophen  (TYLENOL ) oral liquid 160 mg/5 mL, albuterol , alum & mag hydroxide-simeth, bisacodyl , diphenhydrAMINE , guaiFENesin -dextromethorphan, ondansetron  (ZOFRAN ) IV, oxyCODONE , polyethylene glycol, simethicone , sodium chloride , sodium phosphate , traZODone   Assessment/Plan   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  Valvular Disease  - severe MR posteriorly directed, mod TR. Likely functional.  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 INR 1.3.  Acute hypoxic/hypercarbic respiratory failure - reintubated 12/21 due to hypercarbia   - s/p Trach 12/23 - Track capped and decannulated 12/28/24.   Acute Systolic Heart Failure w/ Biventricular Dysfunction >> Cardiogenic Shock - HS trop not c/w ACS but EKG w/ anteroseptal Qs  - CMRI Severe AR/MR. LVEF 35%  - Cath- normal cors, RA5, PA 32/17 (24), PVR 2.57 Papi 3. CO 4.9 CI 3.8 - Post-op Echo 12/02/24: EF < 20% with severe LVH (new, ?inflammatory), moderately decreased RV function, stable bioprosthetic MV and AoV.  - Impella removed 12/24  - Ltd echo 12/28: EF 40-45%, mildly reduced RV - Labs and exam suggest volume depletion from solute standpoint. - Off spiro d/t hyperkalemia. Retrial in  future. Note has bene on banatrol which has potassium in it. - Off losartan  per Nephrology for soft BP. Consider adding back as BP in coming days. - Decreased digoxin  to 0.0625 mg daily, dig level 0.9 1/2. Next check scheduled for the 8th.  - Continue Jardiance  10 mg daily  Ischemic bowel - S/p exp/lap 12/11. Patchy ischemia of the distal ileum extending over about 75 cm without perforation. Ischemic segment resected.  Colon was distended with gas but viable. Small bowel left in discontinuity.  - Back to OR 12/15 for ischemic R colon and mesenteric ischemia. S/p exp/lap, wound vac exchange, R colectomy without anastomosis.  - Returned to OR  12/16 for ileostomy creation.  - GSU following. Ileostomy working well, output imrpoving - TPN off. On TF. Now eating solid food.  - Continue warfarin. Continue to hold heparin  gtt   R hemothorax: resolved - Hemothorax s/p right thoracentesis with intercostal artery injury. Massive bleeding requiring multiple products and development of hemorrhagic shock, now improved. S/p IR embolization.  - S/p VATS 12/18 with right chest washout.  - H/H stable. CT out.  ETOH Abuse - heavy drinker, at least 5 drinks per evening - may be contributing to CM. Did not withdrawal - cessation needed after discharge   DMII  - Hgb A1C 6.1 - On SSI.  ID - BAL w/ enterobacter cloacae and candida albicans. Zosyn  > meropenem  (12/21 to 12/23) - Completed cefepime  (12/23 to 12/26) - Completed micafungin  (12/22 to 12/29) - Restarted CTX 12/31 for low fevers, secretions. WBC count improving, 7 day course  AKI - Resolved.  VT - Quiescent - continue amio 200 mg bid  Pericardial effusion - Echo 12/26 moderate effusion without tamponade - Limited echo 12/17 with moderate effusion.  - This was not drained at time of VATS/right hemothorax washout on 12/18. Appeared fibrinous. - Ltd echo 12/28 with increased, large pericardial effusion, no tamponade - continue colchicine   0.3 mg daily - Repeat limited echo 1/2 reviewed, no change  Atrial flutter - Prev in/out of AFL with controlled rate.  - Remains in NSR. Continue amio 200 mg bid - Warfarin started 12/30, INR 1.3 today  Anemia -Hgb stable 8.3 -T sat 17 but ferritin high -Hold off on IV iron as he's received multiple transfusions  Length of Stay: 2   Alejandro Lopez N, PA-C  12/30/2024, 11:12 AM  Advanced Heart Failure Team Pager [phone] (M-F; 7a - 5p)   Please visit [email or web address]: For overnight coverage please call cardiology fellow first. If fellow not available call Shock/ECMO MD on call.  For ECMO / Mechanical Support (Impella, IABP, LVAD) issues call Shock / ECMO MD on call.   "

## 2024-12-30 NOTE — Patient Care Conference (Signed)
 Inpatient RehabilitationTeam Conference and Plan of Care Update Date: 12/30/2024   Time: 10:09 AM    Patient Name: Alejandro Lopez      Medical Record Number: [id number]  Date of Birth: [birth year] Sex: Male         Room/Bed: 4M07C/4M07C-01 Payor Info: Payor: [place] MEDICAID PREPAID HEALTH PLAN / Plan: [place] MEDICAID [employer] / Product Type: *No Product type* /    Admit Date/Time:  12/28/2024  5:12 PM  Primary Diagnosis:  Debility  Hospital Problems: Principal Problem:   Debility    Expected Discharge Date: Expected Discharge Date: 01/05/25  Team Members Present: Physician leading conference: Dr. Prentice Compton Social Worker Present: Rhoda Clement, LCSW Nurse Present: Barnie Ronde, RN PT Present: Sherlean Perks, PT;Dominic Freddi, PTA OT Present: Recardo Maxwell, OT SLP Present: Recardo Cowing, SLP     Current Status/Progress Goal Weekly Team Focus  Bowel/Bladder   Patient is continent with bladder with good urine output. Patient has ileostomy.   Patient will remain continent of bladder. Ileostomy will function properly with stoma appearing healthy and peristomal skin remaining intact.   Monitor bladder pattern to prevent urinary issues. Maintain proper ileostomy function and prevent peristomal skin breakdown.    Swallow/Nutrition/ Hydration               ADL's   CGA/ min A  mod ind   activity tolerance, general strength, mobility, AE/DME education/assessment, pt/fam education    Mobility   mod I bed mobility, supervision transfers using the RW, supervision gait >150' with the RW. 425' with the RW. Tachycardic resting   mod I  DC planning, building activity tolerance and cardiovascular endurance    Communication                Safety/Cognition/ Behavioral Observations               Pain   Patient denies pain.   Patient will continue to report no pain.   Assess pain every shift and as needed and address pain issues with ordered pain  management inteventions.    Skin   Pt has a midline wound, tracheostomy wound, a deep tissue pressure injury to the sacum, and an ileostomy. Scars to the chest and back. There are wound care orders in place.   Promote healing and prevent infection and further skin breakdown.  Ongoing skin assessment every shift and as needed and carry out wound care as ordered to promote healing, prevent infection and  further skin breakdown.      Discharge Planning:  Home with wife and children will have 24/7 care, wife will need to learn wound care and ileostomy care. Will try to find home health agency to accept his referral   Team Discussion: Patient admitted with debility post mitral valve repair, colonic ischemia with infection requiring ileostomy/colectomy, tracheostomy. Chest incision healed; continue sternal precautions. Currently maintained on po diet with HS tube feedings via cortrak with ReliZorb cartridges; calorie count ongoing.   Patient on target to meet rehab goals: yes, currently needs mod I for bed mobility and light assist for lower body care.  Able to ambulate with CGA - supervision but limited by cardio-activity tolerance and tachycardia at rest. 6 minute walk test up to 425' without a break;diaphoretic post activity. Goals for discharge set for mod I overall.  *See Care Plan and progress notes for long and short-term goals.   Revisions to Treatment Plan:  WOC following for ostomy care NPWT discontinued; change to w-d  dressing on abd   Teaching Needs: Safety, medications, ostomy care, skin care, incision/dressing, transfers, toileting, dietary modification, etc.   Current Barriers to Discharge: Decreased caregiver support and new ileostomy  Possible Resolutions to Barriers: Family education Cardiac Rehab follow up  DME: shower seat     Medical Summary Current Status: Daily wet to dry dressing changes for abd wound, dobbhoff tube feds , cal count in progress  Barriers to  Discharge: Inadequate Nutritional Intake;Electrolyte abnormality;Complicated Wound   Possible Resolutions to [employer] Focus: Cardiology , nephrology and IM consults , complete cal count , adjust TF, monitor labs   Continued Need for Acute Rehabilitation Level of Care: The patient requires daily medical management by a physician with specialized training in physical medicine and rehabilitation for the following reasons: Direction of a multidisciplinary physical rehabilitation program to maximize functional independence : Yes Medical management of patient stability for increased activity during participation in an intensive rehabilitation regime.: Yes Analysis of laboratory values and/or radiology reports with any subsequent need for medication adjustment and/or medical intervention. : Yes   I attest that I was present, lead the team conference, and concur with the assessment and plan of the team.   Fredericka Sober B 12/30/2024, 2:15 PM

## 2024-12-30 NOTE — Progress Notes (Signed)
 ANTICOAGULATION CONSULT NOTE  Pharmacy Consult for warfarin Indication: bA/MVR, new AFL  Allergies[1]  Patient Measurements: Height: 5' 10 (177.8 cm) Weight: 64.3 kg (141 lb 12.1 oz) IBW/kg (Calculated) : 73 Heparin  Dosing Weight: 70 kg   Vital Signs: Temp: 98.1 F (36.7 C) (01/07 1227) Temp Source: Oral (01/07 1227) BP: 111/71 (01/07 1227) Pulse Rate: 99 (01/07 1227)  Labs: Recent Labs    12/28/24 0119 12/29/24 0538 12/29/24 0945 12/30/24 0435  HGB 8.3* 8.2*  --   --   HCT 25.4* 24.5*  --   --   PLT 424* 421*  --   --   LABPROT 14.5  --  15.6* 16.4*  INR 1.1  --  1.2 1.3*  CREATININE 0.58* 0.72  --  0.72    Estimated Creatinine Clearance: 106.1 mL/min (by C-G formula based on SCr of 0.72 mg/dL).  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  Postop course complicated by ischemic bowel s/p exlap 12/11 with partial small bowel resection, abdomen left open.  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 Impella 5.5 removed 12/24.  Was on TPN 12/12>>12/29.   Per Dr. Lucas, plan for 3 months of OAC with dual valve replacement. Not on anticoagulation prior to admission.  Pharmacy consulted for heparin  and warfarin dosing. Heparin  infusion paused 1/3 AM after BRBPR; another BRBM 1/4 PM per RN.  No plans to restart heparin  infusion per Dr. Lucas - plan to let INR trend up.  INR 1.3 slowly trending up. Ostomy output slowing, appetite improving some, noctural feeds now.  Notable DDIs - TPN off since 12/29, TF ongoing (changed to nocturnal feeds 1/6). Dietary supplements added 1/6.  S/p CRO x7d (last dose 1/5), PO amiodarone  BID  Goal of Therapy:  INR 2-3 Monitor platelets by anticoagulation protocol: Yes   Plan:  Warfarin 12.5 mg x1 today - anticipate INR will trend up faster as TF's weaned Daily INR, CBC, and s/sx of  bleeding F/u TF plans and PO intake  Thank you for allowing pharmacy to participate in this patient's care,  Maurilio Fila, PharmD Clinical Pharmacist 12/30/2024  1:05 PM       [1]  Allergies Allergen Reactions   Porcine (Pork) Protein-Containing Drug Products Other (See Comments)

## 2024-12-30 NOTE — Progress Notes (Signed)
 Physical Therapy Session Note  Patient Details  Name: NYQUAN SELBE MRN: [id number] Date of Birth: 1981/04/03  Today's Date: 12/30/2024 PT Individual Time: 1020-1047 PT Individual Time Calculation (min): 27 min   Short Term Goals: Week 1:  PT Short Term Goal 1 (Week 1): STG=LTG due to ELOS  Skilled Therapeutic Interventions/Progress Updates: Pt presented in bed agreeable to therapy. Pt denies pain during session. Session focused on gait and general conditioning. Pt completed bed mobiltiy with supervision and maintained sternal precautions. Pt ambulated with RW and supervision to ortho gym ~123ft and set up at [employer]. Participated in [facility] program x 10 min hill setting range L3-5 avg 12 SPM. Pt indicated 5/10 exertion on mBORG. After brief rest pt ambulated back to room in same manner as prior. Pt returned to bed at end of session transferring with supervision. Pt left in bed at end of session with bed alarm on, call bell within reach and needs met.      Therapy Documentation Precautions:  Precautions Precautions: Fall, Sternal Precaution/Restrictions Comments: Feeding tube, wound vac, ostomy Restrictions Weight Bearing Restrictions Per Provider Order: Yes RUE Weight Bearing Per Provider Order: Non weight bearing LUE Weight Bearing Per Provider Order: Non weight bearing Other Position/Activity Restrictions: sternal precautions  Therapy/Group: Individual Therapy  Oakleigh Hesketh 12/30/2024, 2:54 PM

## 2024-12-30 NOTE — Progress Notes (Signed)
 [facility] Progress Note   Assessment/ Plan:    Hyponatremia: drifting down over course of hospitalization, now 125.  On TF.               - check urine and serum osms             - check cortisol and TSH- WNL             - doesn't look overloaded anymore, may be dry-- want to get data before fluid administration   2.  Hyperkalemia:             - looks like multifactorial             - temporarily stop losartan  esp in setting of soft pressures--> may need to restart but will hold for now             - note banatrol has sig potassium in it (seems to need it for ostomy output)             - gave lokelma  today             - discussed high K diet   3.  S/p bioprosthetic MVR/AVR             - on coumadin               4.  S/p trach             - decannulated now   5.  Protein calorie malnutrition             - on TF, have changed them to night per pt   6.  Dispo: pending    Subjective:    K better, Na slightly better.  TSH and cortisol WNL.  Awiting urine lytes   Objective:   BP 111/71 (BP Location: Right Arm)   Pulse 99   Temp 98.1 F (36.7 C) (Oral)   Resp 17   Ht 5' 10 (1.778 m)   Wt 64.3 kg   SpO2 100%   BMI 20.34 kg/m   Intake/Output Summary (Last 24 hours) at 12/30/2024 1515 Last data filed at 12/30/2024 1511 Gross per 24 hour  Intake 2462.66 ml  Output 1425 ml  Net 1037.66 ml   Weight change: 0.1 kg  Physical Exam: GEN nad HEENT eomi perrl NECK flat neck veins PULM clear CV RRR, crisp S1S2 ABD  soft, nondistended, abd dressing in place, ostomy with mod liquid stool EXT no LE edema NEURO AAO x 3 SKIN no rashes MSK sarcopenic  Imaging: No results found.  Labs: BMET Recent Labs  Lab 12/24/24 0143 12/24/24 0340 12/25/24 0116 12/25/24 0555 12/26/24 0046 12/27/24 0126 12/28/24 0119 12/29/24 0538 12/30/24 0435  NA 131*   < > 131* 133* 130* 129* 129* 125* 127*  K 4.7   < > 4.6 4.6 5.0 4.9 5.3* 5.2* 5.1  CL 99  --  100  --  99 98  97* 93* 94*  CO2 24  --  22  --  22 21* 20* 22 21*  GLUCOSE 105*  --  120*  --  103* 127* 141* 115* 138*  BUN 15  --  17  --  15 17 17  22* 26*  CREATININE 0.53*  --  0.52*  --  0.50* 0.55* 0.58* 0.72 0.72  CALCIUM  8.7*  --  8.6*  --  8.6* 8.8* 9.0 9.4 9.3   < > = values in this interval not  displayed.   CBC Recent Labs  Lab 12/26/24 0046 12/26/24 1158 12/27/24 0126 12/28/24 0119 12/29/24 0538  WBC 10.8*  --  11.2* 11.8* 11.2*  NEUTROABS  --   --   --   --  4.7  HGB 8.3* 8.2* 7.9* 8.3* 8.2*  HCT 25.4* 24.8* 24.1* 25.4* 24.5*  MCV 89.8  --  90.6 89.1 88.4  PLT 487*  --  428* 424* 421*    Medications:     (feeding supplement) PROSource Plus  30 mL Oral BID BM   amiodarone   200 mg Oral BID   arformoterol   15 mcg Nebulization BID   colchicine   0.3 mg Oral Daily   [START ON 01/04/2025] cyanocobalamin   1,000 mcg Intramuscular Weekly   [START ON 12/31/2024] digoxin   0.0625 mg Oral Daily   diphenoxylate -atropine   1 tablet Oral QID   empagliflozin   10 mg Oral Daily   fiber supplement (BANATROL TF)  60 mL Oral QID   folic acid   1 mg Oral QHS   hydrocerin   Topical BID   lidocaine   1 patch Transdermal Q24H   loperamide  HCl  4 mg Oral Q6H   [START ON 12/31/2024] multivitamin with minerals  1 tablet Oral Daily   mouth rinse  15 mL Mouth Rinse 4 times per day   [START ON 12/31/2024] pantoprazole   40 mg Oral Daily   polycarbophil  625 mg Per Tube Daily   QUEtiapine   25 mg Oral QHS   revefenacin   175 mcg Nebulization Daily   scopolamine   1 patch Transdermal Q72H   thiamine   100 mg Oral QHS   warfarin  12.5 mg Oral ONCE-1600   Warfarin - Pharmacist Dosing Inpatient   Does not apply q1600    Almarie Bonine, MD 12/30/2024, 3:15 PM

## 2024-12-30 NOTE — Progress Notes (Signed)
 Occupational Therapy Session Note  Patient Details  Name: Alejandro Lopez MRN: [id number] Date of Birth: 02/15/1981  Today's Date: 12/30/2024 OT Individual Time: 9081-9040 OT Individual Time Calculation (min): 41 min    Short Term Goals: Week 1:  OT Short Term Goal 1 (Week 1): STG=LTG d/t ELOS  Skilled Therapeutic Interventions/Progress Updates:    Pt received in bed stating he did not need to bathe or change clothing today as he just put on a clean outfit yesterday.  Let him know that when he sees OT on the schedule he is always welcome to ask for a bath, otherwise the session will focus on activity tolerance and general conditioning.   Pt had questions about showering with ostomy bag and abdominal wound.  Reached out to [facility] RN who clarified the ostomy bag is waterproof so he can shower with that and to keep dressing on wound and have RN change it out after the shower.   Pt agreeable to working on ADL transfers to prepare for shower tomorrow.   Sat to EOB with supervison Started session working on scooting forward to EOB and cues for safe foot alignment Sit to stands from EOB 4x with close S and focus on eccentric stand to sit to challenge legs with no UE use.  Sit to stands from lower arm chair 4x Ambulated with light CGA to bathroom, Sit to stands from low toilet with min A 1st time, then CGA, then close S as pt became more comfortable with the technique with rest breaks as needed.   Pt ambulated back to bed to rest.   Spent time discussing his medical journey, his goals, his desire to quit drinking and smoking. Discussed strategies to make that easier at home.    Pt is very motivated and participated well.   In room resting with all needs met.    Therapy Documentation Precautions:  Precautions Precautions: Fall, Sternal Precaution/Restrictions Comments: Feeding tube, wound vac, ostomy Restrictions Weight Bearing Restrictions Per Provider Order: Yes RUE Weight Bearing Per  Provider Order: Non weight bearing LUE Weight Bearing Per Provider Order: Non weight bearing Other Position/Activity Restrictions: sternal precautions    Pain: 8/10 abdomen/chest - premedicated by nursing      Therapy/Group: Individual Therapy  Kern Gingras 12/30/2024, 12:17 PM

## 2024-12-30 NOTE — Progress Notes (Signed)
 Physical Therapy Session Note  Patient Details  Name: Alejandro Lopez MRN: [id number] Date of Birth: June 02, 1981  Today's Date: 12/30/2024 PT Individual Time: 1346-1456 PT Individual Time Calculation (min): 70 min   Short Term Goals: Week 1:  PT Short Term Goal 1 (Week 1): STG=LTG due to ELOS  Skilled Therapeutic Interventions/Progress Updates:   Received pt supine in bed, pt agreeable to PT treatment, and reported pain 6/10 in chest (pt trying to avoid taking pain meds). Session with emphasis on functional mobility/transfers, generalized strengthening and endurance, dynamic standing balance/coordination, NMR, and gait training. Pt performed all transfers with and without AD and CGA/close supervision throughout session. Pt requested to use RW to walk to gym and ambulated 178ft with RW and CGA fading to close supervision to main therapy gym. Pt engaged with pet therapy dog, Dixie, to uplift mood/spirits. Pt then ambulated 1105ft without AD and CGA. Transitioned to mini squats 3x10 with emphasis on quad strength.   Pt participated in BERG and demonstrates increased fall risk as noted by score of 41/56 on Berg Balance Scale.  (<36= high risk for falls, close to 100%; 37-45 significant >80%; 46-51 moderate >50%; 52-55 lower >25%). Pt performed the following activities inside // bars with emphasis on dynamic standing balance/coordination and proprioception: -static standing on Rockerboard for 1 minute without UE support - limited by chest pain -forward tandem walking on Airex x4 laps with BUE support fading to 1UE support -lateral side stepping on Airex x 4 laps with BUE support fading to 1UE support Pt then performed standing deadlifts with tidal tank 2x10 with supervision and cues for technique. Pt then ambulated additional 285ft without AD and CGA fading to close supervision - noted improvements in stride length and reciprocal arm swing. Took seated rest break, then ambulated 140ft without AD and CGA  fading to close supervision back to room and transitioned into supine mod I. NP present for brief assessment and plan to remove cortrak. Concluded session with pt semi-reclined in bed, needs within reach, and bed alarm on.   Therapy Documentation Precautions:  Precautions Precautions: Fall, Sternal Precaution/Restrictions Comments: Feeding tube, wound vac, ostomy Restrictions Weight Bearing Restrictions Per Provider Order: Yes RUE Weight Bearing Per Provider Order: Non weight bearing LUE Weight Bearing Per Provider Order: Non weight bearing Other Position/Activity Restrictions: sternal precautions  Therapy/Group: Individual Therapy Therisa CHRISTELLA Zaunegger Therisa Stains PT, DPT 12/30/2024, 7:15 AM

## 2024-12-30 NOTE — Consult Note (Addendum)
 WOC Nurse ostomy follow up Stoma type/location: Right lower abd, ileostomy  Stomal assessment/size: 33 mm round, stoma is moist, red, viable, well-budded Peristomal assessment: intact plane, suturing intact, no breakdown Treatment options for stomal/peristomal skin: placed 1-piece convex, barrier ring Output  100 mls liquid brownish effluent Ostomy pouching: 1pc convex #[id number], barrier rings [phone] Education provided: reviewed frequency of change out, skin care needs, if leakage occurs, when to empty pouch, stoma complications, when to go to ER, when o call providers, goal will be for him to change out appliance on Friday- patient is in agreement. Patient shows interest in learning how to care for his ostomy. Now has education booklet on ileostomy at the bedside, encouraged him to read it this week. Explained to patient that it will take 2 weeks or so to get routine down at home. Coordinated care with OT Enrolled patient in [facility] Discharge program: Yes, possible discharge next week. Patient stated he will go home.    Patient will remain on follow-up list for teaching.    Sherrilyn Hals MSN RN CWOCN WOC [facility]  [phone] (Available from 7-3 pm Mon-Friday)

## 2024-12-30 NOTE — Progress Notes (Signed)
 Patient ID: Alejandro Lopez, male   DOB: 12-Jul-1981, 44 y.o.   MRN: [id number]  Met with pt and wife was face timing on his phone to give team conference update regarding goals of mod/I and discharge date of 1/13. Both are aware they need to learn his ileostomy and wound care and worker is still looking for a home health agency to accept his referral. Pt wants the cortrak out and feels with getting tube feedings at night he is not able to be hungry ing the day and eat more also he did not eat for 15 days while on acute. Have messaged MD and PA regarding this concern. He is doing quite well and has come a long way since being admitted to the hospital. Continue to work on discharge needs.

## 2024-12-31 ENCOUNTER — Inpatient Hospital Stay (HOSPITAL_COMMUNITY)

## 2024-12-31 DIAGNOSIS — Z952 Presence of prosthetic heart valve: Secondary | ICD-10-CM | POA: Insufficient documentation

## 2024-12-31 DIAGNOSIS — I3139 Other pericardial effusion (noninflammatory): Secondary | ICD-10-CM

## 2024-12-31 DIAGNOSIS — E871 Hypo-osmolality and hyponatremia: Secondary | ICD-10-CM | POA: Diagnosis not present

## 2024-12-31 DIAGNOSIS — Z932 Ileostomy status: Secondary | ICD-10-CM | POA: Diagnosis not present

## 2024-12-31 DIAGNOSIS — E875 Hyperkalemia: Secondary | ICD-10-CM | POA: Diagnosis not present

## 2024-12-31 DIAGNOSIS — R5381 Other malaise: Secondary | ICD-10-CM | POA: Diagnosis not present

## 2024-12-31 DIAGNOSIS — Z953 Presence of xenogenic heart valve: Secondary | ICD-10-CM

## 2024-12-31 LAB — BASIC METABOLIC PANEL WITH GFR
Anion gap: 11 (ref 5–15)
BUN: 22 mg/dL — ABNORMAL HIGH (ref 6–20)
CO2: 21 mmol/L — ABNORMAL LOW (ref 22–32)
Calcium: 9.8 mg/dL (ref 8.9–10.3)
Chloride: 94 mmol/L — ABNORMAL LOW (ref 98–111)
Creatinine, Ser: 0.65 mg/dL (ref 0.61–1.24)
GFR, Estimated: >60 mL/min
Glucose, Bld: 97 mg/dL (ref 70–99)
Potassium: 4.8 mmol/L (ref 3.5–5.1)
Sodium: 126 mmol/L — ABNORMAL LOW (ref 135–145)

## 2024-12-31 LAB — ECHOCARDIOGRAM LIMITED
Est EF: 30
Height: 70 in
MV VTI: 1.68 cm2
S' Lateral: 4.5 cm
Weight: 2215.18 [oz_av]

## 2024-12-31 LAB — CBC
HCT: 26.7 % — ABNORMAL LOW (ref 39.0–52.0)
Hemoglobin: 8.9 g/dL — ABNORMAL LOW (ref 13.0–17.0)
MCH: 29.4 pg (ref 26.0–34.0)
MCHC: 33.3 g/dL (ref 30.0–36.0)
MCV: 88.1 fL (ref 80.0–100.0)
Platelets: 413 K/uL — ABNORMAL HIGH (ref 150–400)
RBC: 3.03 MIL/uL — ABNORMAL LOW (ref 4.22–5.81)
RDW: 16.9 % — ABNORMAL HIGH (ref 11.5–15.5)
WBC: 12.8 K/uL — ABNORMAL HIGH (ref 4.0–10.5)
nRBC: 0.2 % (ref 0.0–0.2)

## 2024-12-31 LAB — DIGOXIN LEVEL: Digoxin Level: 0.9 ng/mL (ref 0.8–2.0)

## 2024-12-31 LAB — PROTIME-INR
INR: 1.4 — ABNORMAL HIGH (ref 0.8–1.2)
Prothrombin Time: 18.1 s — ABNORMAL HIGH (ref 11.4–15.2)

## 2024-12-31 MED ORDER — SODIUM CHLORIDE 0.9 % IV SOLN: INTRAVENOUS | Status: AC

## 2024-12-31 MED ORDER — WARFARIN SODIUM 2.5 MG PO TABS
12.5000 mg | ORAL_TABLET | Freq: Once | ORAL | Status: AC
  Administered 2024-12-31: 12.5 mg via ORAL
  Filled 2024-12-31: qty 1

## 2024-12-31 MED ORDER — METOPROLOL SUCCINATE ER 25 MG PO TB24
12.5000 mg | ORAL_TABLET | Freq: Every day | ORAL | Status: DC
  Administered 2024-12-31 – 2025-01-05 (×6): 12.5 mg via ORAL
  Filled 2024-12-31 (×6): qty 1

## 2024-12-31 NOTE — Progress Notes (Signed)
 Physical Therapy Session Note  Patient Details  Name: Alejandro Lopez MRN: [id number] Date of Birth: Sep 28, 1981  Today's Date: 12/31/2024 PT Individual Time: 9191-9079; 1303 - 1402 PT Individual Time Calculation (min): 72 min; 59 min   Short Term Goals: Week 1:  PT Short Term Goal 1 (Week 1): STG=LTG due to ELOS  SESSION 1 Skilled Therapeutic Interventions/Progress Updates: Patient sitting EOB on entrance to room. Patient alert and agreeable to PT session.   Patient reported no pain. Beginning of session spent on building pt rapport. Majority of session focused on manual therapy.  Therapeutic Activity: Transfers: Pt performed sit<>stand transfers throughout session with supervision and with RW. Provided VC for hand placement.  Pt ambulated from room<>main gym in RW with supervision and slouched posture. Pt reported this was not the case before (pt noted to have significant trigger points in B upper traps - manual therapy provided). Pt demonstrated improved posture following manual therapy on way back to room with added cue to depress B shoulders and to look forward ahead vs at ground (pt also with improve thoracic extension vs at beginning of session).   Therex:  - B levator scapula stretch 2 x 15 seconds with encouragement for pt to perform throughout day when in room and when feeling stiffness in neck.  - 2 x 10 sit<>stand with tidal tank and cue to maintain neutral BOS and to control eccentric (rest break provided and HR monitored at 134 bpm)  Manual Therapy: Palpation of B upper traps musculature performed with trigger points noted. Education and rationale provided with pt agreeing to participate in intervention. - Trigger point release to stated area with soft tissue mobilization to follow throughout. Increased time required to adjust to pt's tolerance to trigger point release and with a round of hot pack applied to further relax muscule.   Patient sitting in recliner at end of  session with brakes locked, nsg notified no alarm box in room and all needs within reach.  SESSION 2 Skilled Therapeutic Interventions/Progress Updates: Patient semi-reclined in bed with wife present on entrance to room. Patient alert and agreeable to PT session.   Patient reported 8/10 pain in sternum (nsg provided medication). PTA discussed with pt and Alejandro Lopez about lidocaine  patch to decrease pt's knot in R upper trap musculature with potentially needing trigger point injection if that doesn't work (PTA messaged attending physicians to place order). Pt educated on placement of patch starting from clavicle to top of scapula  Therapeutic Activity: Bed Mobility: Pt performed supine<sit on EOB independently. Transfers: Pt performed sit<>stand transfers throughout session with supervision and cue for hand placement in RW.  - Pt ambulated from room<>main gym in RW with supervision and cue to depress B shoulders, look forward ahead and to increase upright posture.   Neuromuscular Re-ed: NMR facilitated during session with focus on dynamic standing balance, coordination Pt ambulated 200'+ roughly without AD to increase dynamic standing balance at beginning of session. - hurdle navigation of various heights from 6-11 with pt requiring minA for heights of 9 and 11 with pt knowing to correct next go around by increasing hip flexion. Pt then CGA - Standing on airex pad with cue to perform toe taps to tall cone with HHA at first then progressed to close supervision - Pt tossing dodge ball in air while ambulating with cues to increase height of toss   NMR performed for improvements in motor control and coordination, balance, sequencing, judgement, and self confidence/ efficacy in performing all  aspects of mobility at highest level of independence.   Therapeutic Exercise: Pt performed the following exercises with therapist providing the described cuing and facilitation for improvement. - 3 x 10  sit<>stand with tidal tank and cue to control descent and to avoid back of legs touching sitting surface to decrease compensation. Rest breaks required.   Patient sitting EOB at end of session with brakes locked, wife cleared for in room transfers, and all needs within reach.     Therapy Documentation Precautions:  Precautions Precautions: Fall, Sternal Precaution/Restrictions Comments: Feeding tube, wound vac, ostomy Restrictions Weight Bearing Restrictions Per Provider Order: Yes RUE Weight Bearing Per Provider Order: Non weight bearing LUE Weight Bearing Per Provider Order: Non weight bearing Other Position/Activity Restrictions: sternal precautions   Therapy/Group: Individual Therapy  Ramie Palladino PTA 12/31/2024, 2:03 PM

## 2024-12-31 NOTE — Progress Notes (Signed)
 ANTICOAGULATION CONSULT NOTE  Pharmacy Consult for warfarin Indication: bA/MVR, new AFL  Allergies[1]  Patient Measurements: Height: 5' 10 (177.8 cm) Weight: 62.8 kg (138 lb 7.2 oz) IBW/kg (Calculated) : 73 Heparin  Dosing Weight: 70 kg   Vital Signs: Temp: 98.3 F (36.8 C) (01/08 0410) Temp Source: Oral (01/08 0410) BP: 121/85 (01/08 0410) Pulse Rate: 102 (01/08 0714)  Labs: Recent Labs    12/29/24 0538 12/29/24 0945 12/30/24 0435 12/31/24 0522  HGB 8.2*  --   --  8.9*  HCT 24.5*  --   --  26.7*  PLT 421*  --   --  413*  LABPROT  --  15.6* 16.4* 18.1*  INR  --  1.2 1.3* 1.4*  CREATININE 0.72  --  0.72 0.65    Estimated Creatinine Clearance: 103.6 mL/min (by C-G formula based on SCr of 0.65 mg/dL).  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  Postop course complicated by ischemic bowel s/p exlap 12/11 with partial small bowel resection, abdomen left open.  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 Impella 5.5 removed 12/24.  Was on TPN 12/12>>12/29.   Per Dr. Lucas, plan for 3 months of OAC with dual valve replacement. Not on anticoagulation prior to admission.  Pharmacy consulted for heparin  and warfarin dosing. Heparin  infusion paused 1/3 AM after BRBPR; another BRBM 1/4 PM per RN.  No plans to restart heparin  infusion per Dr. Lucas - plan to let INR trend up.  INR 1.3 slowly trending up (warfarin started 12/30). Reports improved appetite today, wants to eat some pizza.   Notable DDIs - TPN off since 12/29, TF stopped 1/7 AM (changed to nocturnal feeds 1/6). Dietary supplements added 1/6 - none charted as given yet.  PO amiodarone  BID  Goal of Therapy:  INR 2-3 Monitor platelets by anticoagulation protocol: Yes   Plan:  Warfarin 12.5 mg x1 again today - anticipate INR will trend up faster now that TF's are  off Daily INR, CBC, and s/sx of bleeding F/u PO intake  Thank you for allowing pharmacy to participate in this patient's care,  Maurilio Fila, PharmD Clinical Pharmacist 12/31/2024  10:52 AM    [1]  Allergies Allergen Reactions   Porcine (Pork) Protein-Containing Drug Products Other (See Comments)

## 2024-12-31 NOTE — Progress Notes (Signed)
 [facility] Progress Note   Assessment/ Plan:    Hyponatremia: drifting down over course of hospitalization, now 125.  On TF.               - check urine and serum osms             - check cortisol and TSH- WNL             - urine/ serum lytes seem to reflect hypovolemia- will go ahead and give a small bolus of fluids.  - looks like he's trying to eat pizza- great! (Just watch the K)   2.  Hyperkalemia:             - looks like multifactorial             - temporarily stop losartan  esp in setting of soft pressures--> may need to restart but will hold for now             - note banatrol has sig potassium in it (seems to need it for ostomy output)             - gave lokelma  today             - discussed high K diet   3.  S/p bioprosthetic MVR/AVR             - on coumadin               4.  S/p trach             - decannulated now   5.  Protein calorie malnutrition             - on TF, have changed them to night per pt   6.  Dispo: pending    Subjective:    Urine lytes done yesterday- looks like he may be a little volume down.     Objective:   BP 115/85 (BP Location: Right Arm)   Pulse (!) 106   Temp 97.8 F (36.6 C) (Oral)   Resp 18   Ht 5' 10 (1.778 m)   Wt 62.8 kg   SpO2 100%   BMI 19.87 kg/m   Intake/Output Summary (Last 24 hours) at 12/31/2024 1403 Last data filed at 12/31/2024 0800 Gross per 24 hour  Intake 1478.83 ml  Output 1475 ml  Net 3.83 ml   Weight change: -1.5 kg  Physical Exam: GEN nad HEENT eomi perrl NECK flat neck veins PULM clear CV RRR, crisp S1S2 ABD  soft, nondistended, abd dressing in place, ostomy with mod liquid stool EXT no LE edema NEURO AAO x 3 SKIN no rashes MSK sarcopenic  Imaging: No results found.  Labs: BMET Recent Labs  Lab 12/25/24 0116 12/25/24 0555 12/26/24 0046 12/27/24 0126 12/28/24 0119 12/29/24 0538 12/30/24 0435 12/31/24 0522  NA 131* 133* 130* 129* 129* 125* 127* 126*  K 4.6 4.6 5.0  4.9 5.3* 5.2* 5.1 4.8  CL 100  --  99 98 97* 93* 94* 94*  CO2 22  --  22 21* 20* 22 21* 21*  GLUCOSE 120*  --  103* 127* 141* 115* 138* 97  BUN 17  --  15 17 17  22* 26* 22*  CREATININE 0.52*  --  0.50* 0.55* 0.58* 0.72 0.72 0.65  CALCIUM  8.6*  --  8.6* 8.8* 9.0 9.4 9.3 9.8   CBC Recent Labs  Lab 12/27/24 0126 12/28/24 0119 12/29/24 0538 12/31/24 0522  WBC 11.2* 11.8*  11.2* 12.8*  NEUTROABS  --   --  4.7  --   HGB 7.9* 8.3* 8.2* 8.9*  HCT 24.1* 25.4* 24.5* 26.7*  MCV 90.6 89.1 88.4 88.1  PLT 428* 424* 421* 413*    Medications:     (feeding supplement) PROSource Plus  30 mL Oral BID BM   amiodarone   200 mg Oral BID   arformoterol   15 mcg Nebulization BID   colchicine   0.3 mg Oral Daily   [START ON 01/04/2025] cyanocobalamin   1,000 mcg Intramuscular Weekly   diphenoxylate -atropine   1 tablet Oral QID   empagliflozin   10 mg Oral Daily   feeding supplement  237 mL Oral BID BM   folic acid   1 mg Oral QHS   hydrocerin   Topical BID   lidocaine   1 patch Transdermal Q24H   loperamide   4 mg Oral Q6H   metoprolol  succinate  12.5 mg Oral Daily   multivitamin with minerals  1 tablet Oral Daily   mouth rinse  15 mL Mouth Rinse 4 times per day   pantoprazole   40 mg Oral Daily   polycarbophil  625 mg Per Tube Daily   QUEtiapine   25 mg Oral QHS   revefenacin   175 mcg Nebulization Daily   scopolamine   1 patch Transdermal Q72H   thiamine   100 mg Oral QHS   warfarin  12.5 mg Oral ONCE-1600   Warfarin - Pharmacist Dosing Inpatient   Does not apply q1600    Almarie Bonine, MD 12/31/2024, 2:03 PM

## 2024-12-31 NOTE — Plan of Care (Signed)
" °  Problem: Consults Goal: RH GENERAL PATIENT EDUCATION Description: See Patient Education module for education specifics. Outcome: Progressing Goal: Skin Care Protocol Initiated - if Braden Score 18 or less Description: If consults are not indicated, leave blank or document N/A Outcome: Progressing Goal: Nutrition Consult-if indicated Outcome: Progressing   Problem: RH BOWEL ELIMINATION Goal: RH STG MANAGE BOWEL WITH ASSISTANCE Description: STG Manage Bowel with mod I Assistance. Outcome: Progressing   Problem: RH SKIN INTEGRITY Goal: RH STG SKIN FREE OF INFECTION/BREAKDOWN Description: Manage skin w min assist Outcome: Progressing Goal: RH STG MAINTAIN SKIN INTEGRITY WITH ASSISTANCE Description: STG Maintain Skin Integrity With min Assistance. Outcome: Progressing Goal: RH STG ABLE TO PERFORM INCISION/WOUND CARE W/ASSISTANCE Description: STG Able To Perform Incision/Wound Care With min Assistance. Outcome: Progressing   Problem: RH SAFETY Goal: RH STG ADHERE TO SAFETY PRECAUTIONS W/ASSISTANCE/DEVICE Description: STG Adhere to Safety Precautions With cues Assistance/Device. Outcome: Progressing   Problem: RH PAIN MANAGEMENT Goal: RH STG PAIN MANAGED AT OR BELOW PT'S PAIN GOAL Description: Pain < 4 with prns Outcome: Progressing   Problem: RH KNOWLEDGE DEFICIT GENERAL Goal: RH STG INCREASE KNOWLEDGE OF SELF CARE AFTER HOSPITALIZATION Description: Patient and spouse will be able to manage care at discharge using educational resources for medications, dietary modification and skin care independently Outcome: Progressing   "

## 2024-12-31 NOTE — Progress Notes (Signed)
 "                                                        PROGRESS NOTE   Subjective/Complaints:  Appreciate cardiology as well as nephrology notes.  Patient had core track removed yesterday.  We discussed increasing fluids and meal intake, patient ate 75% of breakfast, Discussed discharge date ROS- neg CP, SOB, N/V/D  Objective:   No results found. Recent Labs    12/29/24 0538 12/31/24 0522  WBC 11.2* 12.8*  HGB 8.2* 8.9*  HCT 24.5* 26.7*  PLT 421* 413*   Recent Labs    12/30/24 0435 12/31/24 0522  NA 127* 126*  K 5.1 4.8  CL 94* 94*  CO2 21* 21*  GLUCOSE 138* 97  BUN 26* 22*  CREATININE 0.72 0.65  CALCIUM  9.3 9.8    Intake/Output Summary (Last 24 hours) at 12/31/2024 0755 Last data filed at 12/31/2024 0439 Gross per 24 hour  Intake 1582.83 ml  Output 1650 ml  Net -67.17 ml         Physical Exam: Vital Signs Blood pressure 121/85, pulse (!) 102, temperature 98.3 F (36.8 C), temperature source Oral, resp. rate 18, height 5' 10 (1.778 m), weight 62.8 kg, SpO2 100%.  ENT- trach site close , small scab, dressing re,moved Right internal jugular site CDI dressing removed  General: No acute distress Mood and affect are appropriate Heart: Regular rate and rhythm no rubs murmurs or extra sounds CHest incision healed  Lungs: Clear to auscultation, breathing unlabored, no rales or wheezes Abdomen: Positive bowel sounds, midline incision ~12cm, midline figure of eight shaped with pink granulation tissues  Extremities: No clubbing, cyanosis, or edema Skin: No evidence of breakdown, no evidence of rash Neurologic: Cranial nerves II through XII intact, motor strength is 4/5 in bilateral deltoid, bicep, tricep, grip, hip flexor, knee extensors, ankle dorsiflexor and plantar flexor   Musculoskeletal: Full range of motion in all 4 extremities. No joint swelling   Assessment/Plan: 1. Functional deficits which require 3+ hours per day of interdisciplinary therapy in a  comprehensive inpatient rehab setting. Physiatrist is providing close team supervision and 24 hour management of active medical problems listed below. Physiatrist and rehab team continue to assess barriers to discharge/monitor patient progress toward functional and medical goals  Care Tool:  Bathing              Bathing assist Assist Level: Minimal Assistance - Patient > 75%     Upper Body Dressing/Undressing Upper body dressing        Upper body assist Assist Level: Supervision/Verbal cueing    Lower Body Dressing/Undressing Lower body dressing            Lower body assist Assist for lower body dressing: Contact Guard/Touching assist     Toileting Toileting    Toileting assist Assist for toileting: Minimal Assistance - Patient > 75%     Transfers Chair/bed transfer  Transfers assist     Chair/bed transfer assist level: Contact Guard/Touching assist     Locomotion Ambulation   Ambulation assist      Assist level: Contact Guard/Touching assist Assistive device: No Device Max distance: 150   Walk 10 feet activity   Assist     Assist level: Contact Guard/Touching assist Assistive device: No Device   Walk 50  feet activity   Assist    Assist level: Contact Guard/Touching assist Assistive device: No Device    Walk 150 feet activity   Assist    Assist level: Contact Guard/Touching assist Assistive device: No Device    Walk 10 feet on uneven surface  activity   Assist     Assist level: Contact Guard/Touching assist     Wheelchair     Assist Is the patient using a wheelchair?: Yes Type of Wheelchair: Manual    Wheelchair assist level: Dependent - Patient 0%      Wheelchair 50 feet with 2 turns activity    Assist        Assist Level: Dependent - Patient 0%   Wheelchair 150 feet activity     Assist      Assist Level: Dependent - Patient 0%   Blood pressure 121/85, pulse (!) 102, temperature 98.3 F  (36.8 C), temperature source Oral, resp. rate 18, height 5' 10 (1.778 m), weight 62.8 kg, SpO2 100%.  Medical Problem List and Plan: 1. Functional deficits secondary to debility after septic shock, ischemic bowel and associated medical and surgical course             -patient may shower             -ELOS/Goals:1/13 mod I to supervision with PT, OT, SLP   2.  Antithrombotics: -DVT/anticoagulation:  Mechanical: Sequential compression devices, below knee Bilateral lower extremities Pharmaceutical: Coumadin              -antiplatelet therapy: N/A   3. Pain Management: Lidoderm  patch.  As needed: Tylenol  and oxycodone .    4. Mood/Behavior/Sleep: LCSW to follow for evaluation and support when available.              - Agitation:  Seroquel  25 mg nightly   5. Neuropsych/cognition: This patient is capable of making decisions on his own behalf.   6. Skin/Wound Care: Routine pressure relief measures.  WOC consulted for ongoing management of ostomy and abdominal wound.  Monitor sites for infection.   7. Fluids/Electrolytes/Nutrition: Monitor strict I&O and daily weights.  Cortrak d/ed -Continue PPI   Dietary consult appreciated  8. Acute biventricular HFrEF with cardiogenic shock status post:  -warfarin for 3 months, heparin  bridge. Pharmacy to dose both.-Anticoagulation held because of blood in ostomy output. INR 1.1 -GDMT - dig, spiro, losartan .  -con't amiodarone  200mg  BID              -con't colchine for pericardial effusion  Appreciate cards f/u    9. Acute respiratory failure with hypoxia and hypercapnia status post tracheostomy:  -Trach capped x 3 days. Decannulated  -occlusive dressing to trach stoma.  -con't Yupelri  and Brovan nebs. Encourage insp spiro.    10.Sepsis with septic shock due to recurrent aspiration pneumonia -Enterobacter:              -complete 7 days of ceftriaxone ; last day 12/29/24    11. Ischemic bowel s/p laparotomy with partial ileum resection end-ileostomy:   -con't fiber, banatrol, imodium , loperamide  to assist with transit and stool consistency; meeting goal for ostomy output ~1L -con't relizorb to help with lipid absorption- 1 cartridge during the day, 2 at night for higher TF flow rates. Anticipate he would benefit from Creon for short gut once off TF.  -may require vitamin B12 injections.  Monitor fat-soluble vitamin levels while here.   12. Anemia of critical illness and previous ABLA from hemothorax: Monitor CBC, transfuse Hb<7  Latest Ref Rng & Units 12/31/2024    5:22 AM 12/29/2024    5:38 AM 12/28/2024    1:19 AM  CBC  WBC 4.0 - 10.5 K/uL 12.8  11.2  11.8   Hemoglobin 13.0 - 17.0 g/dL 8.9  8.2  8.3   Hematocrit 39.0 - 52.0 % 26.7  24.5  25.4   Platelets 150 - 400 K/uL 413  421  424    Stable 1/6 13. Hyponatremia: Na 126         Latest Ref Rng & Units 12/31/2024    5:22 AM 12/30/2024    4:35 AM 12/29/2024    5:38 AM  BMP  Glucose 70 - 99 mg/dL 97  861  884   BUN 6 - 20 mg/dL 22  26  22    Creatinine 0.61 - 1.24 mg/dL 9.34  9.27  9.27   Sodium 135 - 145 mmol/L 126  127  125   Potassium 3.5 - 5.1 mmol/L 4.8  5.1  5.2   Chloride 98 - 111 mmol/L 94  94  93   CO2 22 - 32 mmol/L 21  21  22    Calcium  8.9 - 10.3 mg/dL 9.8  9.3  9.4   Hyper K+ and hypo Na+ but with normal renal function  Appreciate nephro assist  14.  Persistent leukocytosis without fever  LOS: 3 days A FACE TO FACE EVALUATION WAS PERFORMED  Alejandro Lopez 12/31/2024, 7:55 AM     "

## 2024-12-31 NOTE — Progress Notes (Signed)
 "    Advanced Heart Failure Rounding Note  Cardiologist: None  Chief Complaint: Valvular Heart Disease & HFrEF Patient Profile   44 y.o. male w/ h/o heavy alcohol use (at least 5 drinks per evening) and h/o asthma admitted w/ acute systolic heart failure. Strong family history of cardiac disease: mother and maternal grandmother had heart failure and father with CAD. Admitted with acute HFrEF/cardiogenic shock. Echo w/ severe AR, MR and LV dysfunction.    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  12/9 VT/VF ? vagal episode. Extubated 12/11: S/p R thoracentesis with resultant hemothorax.  Chest tube placed. S/p R intercostal artery coil (IR). S/p exp lap for ischemic bowel resection, bowel left in discontinuity 12/15: Back to OR for ischemic R colon and mesenteric ischemia. S/p exp/lap, wound vac exchange, R colectomy without anastomosis.  12/16 Back to OR for small bowel resection and ileostomy  12/18 OR for VATS/hemothorax washout, pericardial window was not done.  12/19 extubated 12/21 Reintubated. Ileus 12/23 S/p Trach. Fever post procedure, Tmax 101. Abx switched to cefepime    12/24 Impella removed 12/25: DBA stopped. Diuretics held d/t low CVPs 12/26: Chest tube/foley out 12/31: Started 7 day course of ceftriaxone  for fevers  1/3: Bleeding from ostomy output, heparin  gtt paused  12/28/24: Trach capped and decannulated. Tx to CIR.   Subjective:    CorTrak has been removed.  PO intake continues to improve. Getting ready to eat a slice of pizza.   Therapy team mentions tachycardia during rehab sessions. He is not symptomatic with this.  Objective:    Weight Range: 62.8 kg Body mass index is 19.87 kg/m.   Vital Signs:   Temp:  [97.8 F (36.6 C)-98.6 F (37 C)] 97.8 F (36.6 C) (01/08 1115) Pulse Rate:  [99-111] 106 (01/08 1115) Resp:  [17-19] 18 (01/08 1115) BP: (100-121)/(67-85) 115/85 (01/08 1115) SpO2:  [99 %-100 %] 100 %  (01/08 1115) Weight:  [62.8 kg] 62.8 kg (01/08 0442) Last BM Date : 12/30/24  Weight change: Filed Weights   12/29/24 0332 12/30/24 0500 12/31/24 0442  Weight: 63 kg 64.3 kg 62.8 kg   Intake/Output:  Intake/Output Summary (Last 24 hours) at 12/31/2024 1155 Last data filed at 12/31/2024 0800 Gross per 24 hour  Intake 1528.83 ml  Output 1475 ml  Net 53.83 ml    Physical Exam  General:  Sitting on side of bed. Cor: No JVD. Regular rate & rhythm, tachy. No murmurs. Lungs: breathing nonlabored Abdomen: nondistended, + ostomy Extremities: no edema Neuro: alert & orientedx3. Affect pleasant    Not on telemetry.  Labs    CBC Recent Labs    12/29/24 0538 12/31/24 0522  WBC 11.2* 12.8*  NEUTROABS 4.7  --   HGB 8.2* 8.9*  HCT 24.5* 26.7*  MCV 88.4 88.1  PLT 421* 413*   Basic Metabolic Panel Recent Labs    98/93/73 0538 12/30/24 0435 12/31/24 0522  NA 125* 127* 126*  K 5.2* 5.1 4.8  CL 93* 94* 94*  CO2 22 21* 21*  GLUCOSE 115* 138* 97  BUN 22* 26* 22*  CREATININE 0.72 0.72 0.65  CALCIUM  9.4 9.3 9.8  MG 2.0  --   --    Liver Function Tests Recent Labs    12/29/24 0538  AST 50*  ALT 116*  ALKPHOS 350*  BILITOT 0.8  PROT 7.9  ALBUMIN  2.9*    ProBNP (last 3 results) Recent Labs    11/22/24 1958 12/29/24  1445  PROBNP 3,354.0* 282.0   Fasting Lipid Panel No results for input(s): CHOL, HDL, LDLCALC, TRIG, CHOLHDL, LDLDIRECT in the last 72 hours.  Medications:    Scheduled Medications:  (feeding supplement) PROSource Plus  30 mL Oral BID BM   amiodarone   200 mg Oral BID   arformoterol   15 mcg Nebulization BID   colchicine   0.3 mg Oral Daily   [START ON 01/04/2025] cyanocobalamin   1,000 mcg Intramuscular Weekly   diphenoxylate -atropine   1 tablet Oral QID   empagliflozin   10 mg Oral Daily   feeding supplement  237 mL Oral BID BM   folic acid   1 mg Oral QHS   hydrocerin   Topical BID   lidocaine   1 patch Transdermal Q24H   loperamide   4  mg Oral Q6H   metoprolol  succinate  12.5 mg Oral Daily   multivitamin with minerals  1 tablet Oral Daily   mouth rinse  15 mL Mouth Rinse 4 times per day   pantoprazole   40 mg Oral Daily   polycarbophil  625 mg Per Tube Daily   QUEtiapine   25 mg Oral QHS   revefenacin   175 mcg Nebulization Daily   scopolamine   1 patch Transdermal Q72H   thiamine   100 mg Oral QHS   Warfarin - Pharmacist Dosing Inpatient   Does not apply q1600    Infusions:    PRN Medications: acetaminophen  (TYLENOL ) oral liquid 160 mg/5 mL, albuterol , alum & mag hydroxide-simeth, bisacodyl , diphenhydrAMINE , guaiFENesin -dextromethorphan, ondansetron  (ZOFRAN ) IV, oxyCODONE , polyethylene glycol, simethicone , sodium chloride , sodium phosphate , traZODone   Assessment/Plan   S/P AVR/MVR with Impella 5.5 placed.  Valvular Disease  - severe MR posteriorly directed, mod TR. Likely functional.  - CMR - Severe AR/MR  - S/P bioprosthetic AVR/MVR 11/30/24 - Continue warfarin. INR 1.4  Acute hypoxic/hypercarbic respiratory failure - reintubated 12/21 due to hypercarbia   - s/p Trach 12/23 - Track capped and decannulated 12/28/24.   Acute Systolic Heart Failure w/ Biventricular Dysfunction >> Cardiogenic Shock - HS trop not c/w ACS but EKG w/ anteroseptal Qs  - CMRI Severe AR/MR. LVEF 35%  - Cath- normal cors, RA5, PA 32/17 (24), PVR 2.57 Papi 3. CO 4.9 CI 3.8 - Post-op Echo 12/02/24: EF < 20% with severe LVH (new, ?inflammatory), moderately decreased RV function, stable bioprosthetic MV and AoV.  - Impella removed 12/24  - Ltd echo 12/28: EF 40-45%, mildly reduced RV - Check limited echo to reassess LV and pericardial effusion - Labs and exam suggest volume depletion from solute standpoint. Expect this will improve as his po increases. - Off spiro d/t hyperkalemia. Retrial in future. Note had been on banatrol which has potassium in it. - Off losartan  per Nephrology for soft BP. Consider adding back as BP in coming days. -  Stop digoxin  with elevated level - Start metoprolol  xl 12.5 mg daily - Continue Jardiance  10 mg daily  Ischemic bowel - S/p exp/lap 12/11. Patchy ischemia of the distal ileum extending over about 75 cm without perforation. Ischemic segment resected.  Colon was distended with gas but viable. Small bowel left in discontinuity.  - Back to OR 12/15 for ischemic R colon and mesenteric ischemia. S/p exp/lap, wound vac exchange, R colectomy without anastomosis.  - Returned to OR 12/16 for ileostomy creation.  - GSU following. Ileostomy working well, output imrpoving - TPN and TF now off. CorTrak removed  R hemothorax: resolved - Hemothorax s/p right thoracentesis with intercostal artery injury. Massive bleeding requiring multiple products and development of  hemorrhagic shock, now improved. S/p IR embolization.  - S/p VATS 12/18 with right chest washout.  - H/H stable. CT out.  ETOH Abuse - heavy drinker, at least 5 drinks per evening - may be contributing to CM. Did not withdrawal - cessation needed after discharge   DMII  - Hgb A1C 6.1 - On SSI.  AKI - Resolved.  VT - Quiescent - continue amio 200 mg bid  Pericardial effusion - Echo 12/26 moderate effusion without tamponade - Limited echo 12/17 with moderate effusion.  - This was not drained at time of VATS/right hemothorax washout on 12/18. Appeared fibrinous. - Ltd echo 12/28 with increased, large pericardial effusion, no tamponade - continue colchicine  0.3 mg daily - Repeat limited echo 1/2 reviewed, no change - Tachycardia noted during PT. Repeat limited echo to assess effusion  Atrial flutter - Prev in/out of AFL with controlled rate.  - Remains in NSR. Continue amio 200 mg bid - Warfarin started 12/30, INR 1.3 today  Anemia -Hgb stable 8.9 -T sat 17 but ferritin high -Hold off on IV iron as he's received multiple transfusions  Length of Stay: 3   Kimberlynn Lumbra N, PA-C  12/31/2024, 11:55 AM  Advanced Heart  Failure Team Pager [phone] (M-F; 7a - 5p)   Please visit [email or web address]: For overnight coverage please call cardiology fellow first. If fellow not available call Shock/ECMO MD on call.  For ECMO / Mechanical Support (Impella, IABP, LVAD) issues call Shock / ECMO MD on call.   "

## 2024-12-31 NOTE — Progress Notes (Signed)
 Occupational Therapy Session Note  Patient Details  Name: Alejandro Lopez MRN: [id number] Date of Birth: May 08, 1981  Today's Date: 12/31/2024 OT Individual Time: 0945-1100 OT Individual Time Calculation (min): 75 min    Short Term Goals: Week 1:  OT Short Term Goal 1 (Week 1): STG=LTG d/t ELOS  Skilled Therapeutic Interventions/Progress Updates:    Pt received in recliner agreeable to therapy  -had pt practice with a new ostomy bag opening and closing end of bag so he would be familiar with how to manipulate end of back for emptying it -ambulation with S with RW to bathroom to practice sitting on toilet to have pt void urine and simulate opening bag to empty contents into toilet, bag not fully enough to empty yet -pt asking about alternative ways to sit and showed him how to straddle seat sitting in a chair in front of toilet which he practiced -pt is slender enough that he should be able to sit on his home toilet and manage. -covered IV site and pt doffed clothing. -supervision with shower and dressing,   Returned to bed to have RN change out his abdominal wound bandage.   Pt in room with staff.    Therapy Documentation Precautions:  Precautions Precautions: Fall, Sternal Precaution/Restrictions Comments: Feeding tube, wound vac, ostomy Restrictions Weight Bearing Restrictions Per Provider Order: Yes RUE Weight Bearing Per Provider Order: Non weight bearing LUE Weight Bearing Per Provider Order: Non weight bearing Other Position/Activity Restrictions: sternal precautions    Vital Signs: Therapy Vitals Temp: 97.8 F (36.6 C) Temp Source: Oral Pulse Rate: (!) 106 Resp: 18 BP: 115/85 Patient Position (if appropriate): Sitting Oxygen  Therapy SpO2: 100 % O2 Device: Room Air Pain:  No pain during OT session  ADL: Upper Body Bathing: Supervision/safety Where Assessed-Upper Body Bathing: Shower Lower Body Bathing: Supervision/safety Where Assessed-Lower Body Bathing:  Shower Upper Body Dressing: Supervision/safety Where Assessed-Upper Body Dressing: Edge of bed Lower Body Dressing: Supervision/safety Where Assessed-Lower Body Dressing: Edge of bed Toileting: Supervision/safety Where Assessed-Toileting: Teacher, Adult Education: Close supervision Statistician Method: Event Organiser: Close supervision Film/video Editor Method: Designer, Industrial/product: Emergency planning/management officer, Grab bars   Therapy/Group: Individual Therapy  Alejandro Lopez 12/31/2024, 12:29 PM

## 2025-01-01 LAB — RENAL FUNCTION PANEL
Albumin: 2.6 g/dL — ABNORMAL LOW (ref 3.5–5.0)
Anion gap: 11 (ref 5–15)
BUN: 14 mg/dL (ref 6–20)
CO2: 21 mmol/L — ABNORMAL LOW (ref 22–32)
Calcium: 9 mg/dL (ref 8.9–10.3)
Chloride: 98 mmol/L (ref 98–111)
Creatinine, Ser: 0.62 mg/dL (ref 0.61–1.24)
GFR, Estimated: >60 mL/min
Glucose, Bld: 114 mg/dL — ABNORMAL HIGH (ref 70–99)
Phosphorus: 4.2 mg/dL (ref 2.5–4.6)
Potassium: 4.3 mmol/L (ref 3.5–5.1)
Sodium: 130 mmol/L — ABNORMAL LOW (ref 135–145)

## 2025-01-01 LAB — C-REACTIVE PROTEIN: CRP: 21.1 mg/dL — ABNORMAL HIGH

## 2025-01-01 LAB — PROTIME-INR
INR: 2 — ABNORMAL HIGH (ref 0.8–1.2)
Prothrombin Time: 23.4 s — ABNORMAL HIGH (ref 11.4–15.2)

## 2025-01-01 LAB — SEDIMENTATION RATE: Sed Rate: 138 mm/h — ABNORMAL HIGH (ref 0–16)

## 2025-01-01 MED ORDER — LACTATED RINGERS IV SOLN: INTRAVENOUS | Status: AC

## 2025-01-01 NOTE — Progress Notes (Signed)
 Physical Therapy Session Note  Patient Details  Name: Alejandro Lopez MRN: [id number] Date of Birth: September 19, 1981  Today's Date: 01/01/2025 PT Individual Time: 8694-8652 PT Individual Time Calculation (min): 42 min   Short Term Goals: Week 1:  PT Short Term Goal 1 (Week 1): STG=LTG due to ELOS  Skilled Therapeutic Interventions/Progress Updates: Pt presented in bed agreeable to therapy. Pt denies pain during session. Pt completed bed mobility with supervision. Pt provided with rollator. Pt ambulated to dayroom and participated in agility ladder activities including in/outs, zig-zags, side stepping. All performed with CGA and overall fair technique. PTA discussed continuing walking after d/c with PTA providing walking program handout. Pt then ambulated throughout unit to [facility] with rollator and returned to room without rest break. Pt left seated EOB at end of session with call bell within reach and needs met.      Therapy Documentation Precautions:  Precautions Precautions: Fall, Sternal Precaution/Restrictions Comments: Feeding tube, wound vac, ostomy Restrictions Weight Bearing Restrictions Per Provider Order: Yes RUE Weight Bearing Per Provider Order: Non weight bearing LUE Weight Bearing Per Provider Order: Non weight bearing Other Position/Activity Restrictions: sternal precautions  Therapy/Group: Individual Therapy  Aylah Yeary 01/01/2025, 4:26 PM

## 2025-01-01 NOTE — Progress Notes (Signed)
 Calorie Count Note  48 hour calorie count ordered. Pt's intake varies drastically day to day. Pt states he does not usually eat breakfast at home. Calorie count results show pt meeting between 31-86% of calorie and 24-54% protein needs with current PO intake. Pt continues to participate well in therapy and is not complaining of fatigue. Suspect as long as pt's energy levels remain high he will be able to continue making progress with therapy. Pt does not like Ensure shakes and requested them to be discontinued. Added double protein portions to tray to help increase intake. Ileostomy output within daily recommendations, but may be skewed in the setting of poor PO. If output becomes high, recommend limiting sodas. Will continue to monitor intake but calorie count can be discontinued at this time.   Diet: regular Supplements: ensure   Estimated Nutritional Needs:  Kcal:  2100-2400  Protein:  95-125g   Day 1: 1/7 Dinner: 35% turkey w/ gravy, roll, mashed potatoes, cola x 2, cookies x 2 (331 kcal, 9 g protein) 1/8 Breakfast: 75% turkey sausage, pancake, eggs, cola x 2 (543 kcal, 22 g protein) 1/8 Lunch: 100% turkey sandwich, french fries, cola, cookie x 2 (937 kcal, 20 g protein) Supplements: refused Ensures  Total intake: 1811 kcal (86% of minimum estimated needs)  51 protein (54% of minimum estimated needs)  Day 2: 1/8 Dinner: pt reports he ate 50% of dinner last night but did not order tray from hospital, may have been brought in by family; reports 50% of chicken salad sandwich (345 kcal, 15 g protein) 1/9 Breakfast: 10% turkey sausage, pancake, eggs, cola (72 kcal, 3 g protein) 1/9 Lunch: 25% turkey sandwich, french fries, cola, cookie x 2 (234 kcal, 5 g protein) Supplements: refused Ensures  Total intake: 651 kcal (31% of minimum estimated needs)  23 protein (24% of minimum estimated needs)    INTERVENTION:  Continue regular diet to meet calorie and protein needs Encouraged family  to bring in food that pt likes   Added double protein portions to pt's trays  Continue ProSource Plus 30 ml BID  Discontinued Ensure per pt request   Continue to monitor ileostomy output trends (currently within normal daily range)  NUTRITION DIAGNOSIS:    Impaired nutrient utilization related to altered GI function as evidenced by other (comment) (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  Remains applicable  GOAL:    Patient will meet greater than or equal to 90% of their needs Progressing     Josette Glance, MS, RDN, LDN Clinical Dietitian I Please reach out via secure chat

## 2025-01-01 NOTE — Progress Notes (Signed)
 Physical Therapy Session Note  Patient Details  Name: Alejandro Lopez MRN: [id number] Date of Birth: December 25, 1980  Today's Date: 01/01/2025 PT Individual Time: 8881-8840; 1420 - 1500 PT Individual Time Calculation (min): 41 min; 40 min   Short Term Goals: Week 1:  PT Short Term Goal 1 (Week 1): STG=LTG due to ELOS  SESSION 1 Skilled Therapeutic Interventions/Progress Updates: Patient sitting in WC on entrance to room. Patient alert and agreeable to PT session.   Patient reported no pain.   Therapeutic Activity: Transfers: Pt performed sit<>stand transfers throughout session with supervision and with rollator. Provided VC for locking brakes. Pt ambulated throughout session in rollator with supervision and continued improvement in upright posture following cuing and manual therapy.   Therapeutic Exercise: Pt performed the following exercises with therapist providing the described cuing and facilitation for improvement. B levator scapula stretch with pt requiring hinted cue to recall set up. 3 x 30 seconds during middle of manual therapy. - NuStep 0.2 miles; 320 total steps; 38 spm; 1.6 METs for 8 minutes on level 4 resistance to increase B LE strength and overall endurance. Pt gave no reports of SOB or fatigue.   Manual Therapy: Palpation of R upper trap musculature performed with trigger points noted. Education and rationale provided with pt agreeing to participate in intervention. - Trigger point release to stated area with soft tissue mobilization to follow throughout. Lidocaine  patch doffed from previous night with knot still present and was able to become decreased following intervention with addition to stretch and hot pack for 6 min.  Patient sitting EOB at end of session with brakes locked, and all needs within reach.  SESSION 2 Skilled Therapeutic Interventions/Progress Updates: Patient sitting EOB on entrance to room. Patient alert and agreeable to PT session.   Patient  reported no pain  Therapeutic Activity: Transfers: Pt performed sit<>stand transfers throughout session with supervision no AD. Provided VC for hand placement and to avoid lengthening chest. Pt performed bed mobility in ADL apartment to simulate bed at home and did so independently and maintained sternal precautions.  Pt ambulated throughout session (100'+) without AD and with supervision and no LOB but some mild gait deviations and cues to maintain upright posture and forward gaze  Therapeutic Exercise: Pt performed the following exercises with therapist providing the described cuing and facilitation for improvement. - sit<>Stand with tidal tank 3x10 - step to 6 step with B LE and 5lb ankle weight donned. 3 rounds close to fatigue with rest breaks required  - LAQ 5lb ankle weight B LE with cue to increase dorsiflexion for improved stretch on calves, and cue to control eccentric and to hold 3 seconds in knee extension. 3 x 15 - Seated hip abduction with red theraband donned distal thigh. 2 x 10  Patient sitting EOB at end of session with brakes locked, physician and wife present, and all needs within reach.       Therapy Documentation Precautions:  Precautions Precautions: Fall, Sternal Precaution/Restrictions Comments: Feeding tube, wound vac, ostomy Restrictions Weight Bearing Restrictions Per Provider Order: Yes RUE Weight Bearing Per Provider Order: Non weight bearing LUE Weight Bearing Per Provider Order: Non weight bearing Other Position/Activity Restrictions: sternal precautions   Therapy/Group: Individual Therapy  Glora Hulgan PTA 01/01/2025, 12:50 PM

## 2025-01-01 NOTE — Progress Notes (Signed)
 Occupational Therapy Session Note  Patient Details  Name: ADEBAYO ENSMINGER MRN: [id number] Date of Birth: 1981-09-06  Today's Date: 01/01/2025 OT Individual Time: 1000-1107 OT Individual Time Calculation (min): 67 min  and Today's Date: 01/01/2025 OT Missed Time: 8 Minutes Missed Time Reason: Unavailable (comment) (pt in meeting with his cardiology PA)   Short Term Goals: Week 1:  OT Short Term Goal 1 (Week 1): STG=LTG d/t ELOS  Skilled Therapeutic Interventions/Progress Updates:    Pt received in bed in room with wife and cardiology PA present for education.  Returned to room after she left.  Wife present with many good questions that were more for wound care nurse, nursing and MD to answer so encouraged them to write down the questions.  Pt and family education while pt was working on mobility tasks, bathing at sink, toileting, dressing. Reviewed safe sit to stand strategies with sternal precautions and safe strategies to clean bottom by rotating through torso to avoid stretching through chest.  Pt demonstrated how he can stand >< sit from low toilet and reach to place ostomy bag inside of toilet to empty it.  Used spare bag to show spouse how it opens and closes at bottom and techniques to open it with finger pinch. They had a lot of questions about changing the bag , cutting the hole, etc. and I explained WOC would explain and teach that.   Pt tolerated standing at sink to bathe self.  Encouraged intermittent seated rest breaks.   Pt and wife asking about a rollator. Obtained a standard one from the supply room and pt practiced ambulating with it from room to ADL apt. Reviewed safe locking of breaks and to try to position it against the wall prior to sitting.  If that is not possible (ie pt is in a park ) to stabilize rollator with hands prior to sitting , but not to reach back too far.  Practiced 2 options of shower transfers, stepping back into shower using rollator for support or side  stepping using wall.  Pt did well with side stepping over shower ledge and sit to stands from shower seat.      Pt sat on taller bed (similar to height at home) and did well with that.  Did not have time for full bed mobility,  so will ask PTA to address this afternoon.   Pt ambulated back to room with rollator. All needs met.    Therapy Documentation Precautions:  Precautions Precautions: Fall, Sternal Precaution/Restrictions Comments: Feeding tube, wound vac, ostomy Restrictions Weight Bearing Restrictions Per Provider Order: Yes RUE Weight Bearing Per Provider Order: Non weight bearing LUE Weight Bearing Per Provider Order: Non weight bearing Other Position/Activity Restrictions: sternal precautions  General OT Amount of Missed Time: 8 Minutes Vital Signs: Oxygen  Therapy SpO2: 98 % O2 Device: Room Air Pain: Pain Assessment Pain Scale: 0-10 Pain Score: 6  Pain Location: Abdomen Pain Intervention(s): Medication (See eMAR) ADL: ADL Eating: Set up Grooming: Independent Upper Body Bathing: Setup Where Assessed-Upper Body Bathing: Standing at sink Lower Body Bathing: Setup Where Assessed-Lower Body Bathing: Standing at sink Upper Body Dressing: Independent Where Assessed-Upper Body Dressing: Edge of bed Lower Body Dressing: Supervision/safety Where Assessed-Lower Body Dressing: Edge of bed Toileting: Supervision/safety Where Assessed-Toileting: Teacher, Adult Education: Close supervision Statistician Method: Event Organiser: Close supervision Film/video Editor Method: Designer, Industrial/product: Emergency planning/management officer, Grab bars   Therapy/Group: Individual Therapy  Aquiles Ruffini 01/01/2025, 12:17 PM

## 2025-01-01 NOTE — Procedures (Signed)
 HPI:   Alejandro Lopez is a 44 y.o. male with PMHx has Low back pain; CHF (congestive heart failure) (HCC); Essential hypertension; Tobacco use; Alcohol use disorder, moderate, dependence (HCC); Abnormal LFTs; Acute CHF (HCC); Acute HFrEF (heart failure with reduced ejection fraction) (HCC); HFrEF (heart failure with reduced ejection fraction) (HCC); Nonrheumatic mitral valve regurgitation; Nonrheumatic aortic valve insufficiency; Severe aortic regurgitation; Acute respiratory failure with hypoxia (HCC); On mechanically assisted ventilation (HCC); Tracheostomy care [facility]); Physical deconditioning; Tracheostomy dependent (HCC); Severe protein-calorie malnutrition; Ileostomy present (HCC); Debility; Tracheostomy in place [facility]); Severe protein-energy malnutrition; and S/P MVR (mitral valve replacement) on their problem list. who presents to clinic for treatment of pain related to myofascial shoulder and neck pain  via injection as described below.    Physical Exam:  General: Appropriate appearance for age.  Mental Status: Appropriate mood and affect.  Cardiovascular: RRR, no m/r/g.  Respiratory: CTAB, no rales/rhonchi/wheezing.  Skin: No apparent rashes or lesions.  Neuro: Awake, alert, and oriented x3. No apparent deficits.  MSK: + TTP bilateral levator scapulae, trapezius, and supraspinatus muscles  PROCEDURE: Trigger point injections Diagnosis: Myofascial neck pain Goals with treatment: [x ] Decrease pain [x]  Improve Active / Passive ROM [ x ] Improve ADLs [ x ] Improve functional mobility  MEDICATION:  Lidocaine  1% - 3 ccs   CONSENT: Obtained in writing followed by time-out per clinic policy. Consent uploaded to chart.  Benefits discussed.  Risks discussed included, but were not limited to, pain and discomfort, bleeding, bruising, allergic reaction, infection. All questions answered to patient/family member/guardian/ caregiver satisfaction. They would like to proceed with procedure.  There are no noted contraindications to procedure.  PROCEDURE Time out was preformed No heat sources No antibiotics  The patient was explained about both the benefits and risks of a trigger point injection. After the patient acknowledged an understanding of the risks and benefits, the patient agreed to proceed. The area was first marked and then prepped in an aseptic fashion with betadine / alcohol. A 30 g, 1/2 inch needle was directed via a posterior approach into the bilateral levator scapulae, trapezius, and supraspinatus muscles. The injection was completed with 3cc of 1% lidocaine  after no blood was aspirated on pull back.  The pt tolerated the procedure well. The patient reported immediate relief of the pain and improved pain free range of motion. No complications were encountered.   Impression: HPI: Alejandro Lopez is a 44 y.o. male with PMHx has Low back pain; CHF (congestive heart failure) (HCC); Essential hypertension; Tobacco use; Alcohol use disorder, moderate, dependence (HCC); Abnormal LFTs; Acute CHF (HCC); Acute HFrEF (heart failure with reduced ejection fraction) (HCC); HFrEF (heart failure with reduced ejection fraction) (HCC); Nonrheumatic mitral valve regurgitation; Nonrheumatic aortic valve insufficiency; Severe aortic regurgitation; Acute respiratory failure with hypoxia (HCC); On mechanically assisted ventilation (HCC); Tracheostomy care [facility]); Physical deconditioning; Tracheostomy dependent (HCC); Severe protein-calorie malnutrition; Ileostomy present (HCC); Debility; Tracheostomy in place [facility]); Severe protein-energy malnutrition; and S/P MVR (mitral valve replacement) on their problem list. who presents to clinic for treatment of bilateral shoulder pain . They received a  Bilateral  trigger point injections as above.   PLAN: - Resume Usual Activities. Notify Physician of any unusual bleeding, erythema or concern for side effects as reviewed above. - Apply ice prn for pain -  Follow up with Dr Gunnar  Patient/Care Giver was ready to learn without apparent learning barriers. Education was provided on diagnosis, treatment options/plan according to patient's preferred learning style. Patient/Care Investment Banker, Corporate  verbalized understanding and agreement with the above plan.   Joesph JAYSON Likes, DO 01/01/2025

## 2025-01-01 NOTE — Progress Notes (Signed)
 [facility] Progress Note   Assessment/ Plan:    Hyponatremia: drifting down over course of hospitalization, now 125.  On TF.               - check urine and serum osms             - check cortisol and TSH- WNL             - urine/ serum lytes seem to reflect hypovolemia-repeat small bolus today.  - looks like he's trying to eat pizza- great! (Just watch the K)  - nothing else to suggest at this time- will sign off.  Call with questions  - losartan  was stopped by me a couple of days ago- BP is still pretty low- may not need to go back on--this could be decided as an outpt too   2.  Hyperkalemia:             - looks like multifactorial             - temporarily stop losartan  esp in setting of soft pressures--> may need to restart but will hold for now             - note banatrol has sig potassium in it (seems to need it for ostomy output)             - gave lokelma  x 1- now fine             - discussed high K diet   3.  S/p bioprosthetic MVR/AVR             - on coumadin               4.  S/p trach             - decannulated now   5.  Protein calorie malnutrition             - on TF, have changed them to night per pt   6.  Dispo: pending    Subjective:    Na coming up.  Looks better.  Excited to hear good news   Objective:   BP 104/75 (BP Location: Right Arm)   Pulse (!) 105   Temp 98.3 F (36.8 C) (Oral)   Resp 18   Ht 5' 10 (1.778 m)   Wt 62 kg   SpO2 98%   BMI 19.61 kg/m   Intake/Output Summary (Last 24 hours) at 01/01/2025 1337 Last data filed at 01/01/2025 0805 Gross per 24 hour  Intake 118 ml  Output 1650 ml  Net -1532 ml   Weight change: -0.8 kg  Physical Exam: GEN nad HEENT eomi perrl NECK flat neck veins PULM clear CV RRR, crisp S1S2 ABD  soft, nondistended, abd dressing in place, ostomy with mod liquid stool EXT no LE edema NEURO AAO x 3 SKIN no rashes MSK sarcopenic  Imaging: ECHOCARDIOGRAM LIMITED Result Date: 12/31/2024     ECHOCARDIOGRAM LIMITED REPORT   Patient Name:   Alejandro Lopez Date of Exam: 12/31/2024 Medical Rec #:  [id number]        Height:       70.0 in Accession #:    [id number]       Weight:       138.4 lb Date of Birth:  Dec 18, 1981       BSA:          1.785 m Patient Age:    43 years  BP:           115/85 mmHg Patient Gender: M                HR:           110 bpm. Exam Location:  Inpatient Procedure: Limited Echo, Cardiac Doppler and Color Doppler (Both Spectral and            Color Flow Doppler were utilized during procedure). Indications:    Pericardial effusion I31.3  History:        Patient has prior history of Echocardiogram examinations, most                 recent 12/25/2024. CHF, Pericardial Disease; Aortic Valve Disease                 and Mitral Valve Disease.                 Aortic Valve: 23 mm INSPIRIS RESILIA AORTIC VALVE 23mm                 Replacement valve is present in the aortic position. Procedure                 Date: 11/30/24.                 Mitral Valve: 27 mm MITRIS RESILIA MITRAL VALVE 27mm valve is                 present in the mitral position. Procedure Date: 11/30/24.  Sonographer:    Jayson Gaskins Referring Phys: MANUELITA GARRE [facility] IMPRESSIONS  1. Left ventricular ejection fraction, by estimation, is 30%.  2. The mitral valve has been repaired/replaced. The mean mitral valve gradient is 8.7 mmHg. There is a 27 mm MITRIS RESILIA MITRAL VALVE 27mm present in the mitral position. Procedure Date: 11/30/24.  3. Large pericardial effusion. The pericardial effusion is posterior to the left ventricle. There is no evidence of cardiac tamponade.  4. The aortic valve has been repaired/replaced. There is a 23 mm INSPIRIS RESILIA AORTIC VALVE 23mm Replacement valve present in the aortic position. Procedure Date: 11/30/24.  5. The inferior vena cava is normal in size with greater than 50% respiratory variability, suggesting right atrial pressure of 3 mmHg. Comparison(s): Prior images reviewed side by  side. Changes from prior study are noted. LVEF slightly decreased compared to prior, though rate faster. Conclusion(s)/Recommendation(s): Large effusion remains, though IVC collapses, suggesting no tamponade. FINDINGS  Left Ventricle: Left ventricular ejection fraction, by estimation, is 30%. Abnormal (paradoxical) septal motion consistent with post-operative status. Pericardium: TV inflow velocities without significant respiratory variation. MV inflow velocities have dropout during respiratory variation, cannot tell if it is true variation or dropout. IVC collapses, supports no tamponade. A large pericardial effusion is present. The pericardial effusion is posterior to the left ventricle. There is no evidence of cardiac tamponade. Mitral Valve: The mitral valve has been repaired/replaced. There is a 27 mm MITRIS RESILIA MITRAL VALVE 27mm present in the mitral position. Procedure Date: 11/30/24. MV peak gradient, 18.5 mmHg. The mean mitral valve gradient is 8.7 mmHg with average heart rate of 108 bpm. Tricuspid Valve: The tricuspid valve is normal in structure. Tricuspid valve regurgitation is trivial. No evidence of tricuspid stenosis. Aortic Valve: The aortic valve has been repaired/replaced. There is a 23 mm INSPIRIS RESILIA AORTIC VALVE 23mm Replacement valve present in the aortic position. Procedure Date: 11/30/24. Venous: The inferior vena cava is normal in size  with greater than 50% respiratory variability, suggesting right atrial pressure of 3 mmHg. LEFT VENTRICLE PLAX 2D LVIDd:         5.60 cm LVIDs:         4.50 cm LV PW:         1.30 cm LV IVS:        1.10 cm LVOT diam:     1.80 cm LV SV:         58 LV SV Index:   33 LVOT Area:     2.54 cm  LEFT ATRIUM           Index LA Vol (A4C): 72.1 ml 40.39 ml/m  AORTIC VALVE LVOT Vmax:   160.00 cm/s LVOT Vmean:  120.000 cm/s LVOT VTI:    0.229 m MITRAL VALVE MV Area VTI:  1.68 cm    SHUNTS MV Peak grad: 18.5 mmHg   Systemic VTI:  0.23 m MV Mean grad: 8.7 mmHg     Systemic Diam: 1.80 cm MV Vmax:      2.15 m/s MV Vmean:     150.0 cm/s Shelda Bruckner MD Electronically signed by Shelda Bruckner MD Signature Date/Time: 12/31/2024/7:39:12 PM    Final     Labs: BMET Recent Labs  Lab 12/26/24 9953 12/27/24 0126 12/28/24 0119 12/29/24 9461 12/30/24 0435 12/31/24 0522 01/01/25 0551  NA 130* 129* 129* 125* 127* 126* 130*  K 5.0 4.9 5.3* 5.2* 5.1 4.8 4.3  CL 99 98 97* 93* 94* 94* 98  CO2 22 21* 20* 22 21* 21* 21*  GLUCOSE 103* 127* 141* 115* 138* 97 114*  BUN 15 17 17  22* 26* 22* 14  CREATININE 0.50* 0.55* 0.58* 0.72 0.72 0.65 0.62  CALCIUM  8.6* 8.8* 9.0 9.4 9.3 9.8 9.0  PHOS  --   --   --   --   --   --  4.2   CBC Recent Labs  Lab 12/27/24 0126 12/28/24 0119 12/29/24 0538 12/31/24 0522  WBC 11.2* 11.8* 11.2* 12.8*  NEUTROABS  --   --  4.7  --   HGB 7.9* 8.3* 8.2* 8.9*  HCT 24.1* 25.4* 24.5* 26.7*  MCV 90.6 89.1 88.4 88.1  PLT 428* 424* 421* 413*    Medications:     (feeding supplement) PROSource Plus  30 mL Oral BID BM   amiodarone   200 mg Oral BID   arformoterol   15 mcg Nebulization BID   colchicine   0.3 mg Oral Daily   [START ON 01/04/2025] cyanocobalamin   1,000 mcg Intramuscular Weekly   diphenoxylate -atropine   1 tablet Oral QID   empagliflozin   10 mg Oral Daily   folic acid   1 mg Oral QHS   hydrocerin   Topical BID   lidocaine   1 patch Transdermal Q24H   loperamide   4 mg Oral Q6H   metoprolol  succinate  12.5 mg Oral Daily   multivitamin with minerals  1 tablet Oral Daily   mouth rinse  15 mL Mouth Rinse 4 times per day   pantoprazole   40 mg Oral Daily   polycarbophil  625 mg Per Tube Daily   QUEtiapine   25 mg Oral QHS   revefenacin   175 mcg Nebulization Daily   scopolamine   1 patch Transdermal Q72H   thiamine   100 mg Oral QHS   Warfarin - Pharmacist Dosing Inpatient   Does not apply q1600    Almarie Bonine, MD 01/01/2025, 1:37 PM

## 2025-01-01 NOTE — Plan of Care (Signed)
" °  Problem: Consults Goal: RH GENERAL PATIENT EDUCATION Description: See Patient Education module for education specifics. Outcome: Progressing Goal: Skin Care Protocol Initiated - if Braden Score 18 or less Description: If consults are not indicated, leave blank or document N/A Outcome: Progressing Goal: Nutrition Consult-if indicated Outcome: Progressing   Problem: RH BOWEL ELIMINATION Goal: RH STG MANAGE BOWEL WITH ASSISTANCE Description: STG Manage Bowel with mod I Assistance. Outcome: Progressing   Problem: RH SKIN INTEGRITY Goal: RH STG SKIN FREE OF INFECTION/BREAKDOWN Description: Manage skin w min assist Outcome: Progressing Goal: RH STG MAINTAIN SKIN INTEGRITY WITH ASSISTANCE Description: STG Maintain Skin Integrity With min Assistance. Outcome: Progressing Goal: RH STG ABLE TO PERFORM INCISION/WOUND CARE W/ASSISTANCE Description: STG Able To Perform Incision/Wound Care With min Assistance. Outcome: Progressing   Problem: RH SAFETY Goal: RH STG ADHERE TO SAFETY PRECAUTIONS W/ASSISTANCE/DEVICE Description: STG Adhere to Safety Precautions With cues Assistance/Device. Outcome: Progressing   Problem: RH PAIN MANAGEMENT Goal: RH STG PAIN MANAGED AT OR BELOW PT'S PAIN GOAL Description: Pain < 4 with prns Outcome: Progressing   Problem: RH KNOWLEDGE DEFICIT GENERAL Goal: RH STG INCREASE KNOWLEDGE OF SELF CARE AFTER HOSPITALIZATION Description: Patient and spouse will be able to manage care at discharge using educational resources for medications, dietary modification and skin care independently Outcome: Progressing   "

## 2025-01-01 NOTE — Progress Notes (Signed)
 "    Advanced Heart Failure Rounding Note  Cardiologist: None  Chief Complaint: Valvular Heart Disease & HFrEF Patient Profile   44 y.o. male w/ h/o heavy alcohol use (at least 5 drinks per evening) and h/o asthma admitted w/ acute systolic heart failure. Strong family history of cardiac disease: mother and maternal grandmother had heart failure and father with CAD. Admitted with acute HFrEF/cardiogenic shock. Echo w/ severe AR, MR and LV dysfunction.    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  12/9 VT/VF ? vagal episode. Extubated 12/11: S/p R thoracentesis with resultant hemothorax.  Chest tube placed. S/p R intercostal artery coil (IR). S/p exp lap for ischemic bowel resection, bowel left in discontinuity 12/15: Back to OR for ischemic R colon and mesenteric ischemia. S/p exp/lap, wound vac exchange, R colectomy without anastomosis.  12/16 Back to OR for small bowel resection and ileostomy  12/18 OR for VATS/hemothorax washout, pericardial window was not done.  12/19 extubated 12/21 Reintubated. Ileus 12/23 S/p Trach. Fever post procedure, Tmax 101. Abx switched to cefepime    12/24 Impella removed 12/25: DBA stopped. Diuretics held d/t low CVPs 12/26: Chest tube/foley out 12/31: Started 7 day course of ceftriaxone  for fevers  1/3: Bleeding from ostomy output, heparin  gtt paused  12/28/24: Trach capped and decannulated. Tx to CIR.   Subjective:   Given IVF back overnight per Nephrology for volume depletion.  Electrolyte abnormalities continue to improve.  INR up to 2.  Feeling well. No shortness of breath. Progressing with PT/OT. Very appreciative of his care.   Objective:    Weight Range: 62 kg Body mass index is 19.61 kg/m.   Vital Signs:   Temp:  [97.7 F (36.5 C)-98.3 F (36.8 C)] 98.3 F (36.8 C) (01/09 0555) Pulse Rate:  [97-106] 105 (01/09 0555) Resp:  [18-19] 18 (01/09 0555) BP: (104-115)/(67-85) 104/75 (01/09  0555) SpO2:  [98 %-100 %] 98 % (01/09 0850) Weight:  [62 kg] 62 kg (01/09 0500) Last BM Date : 01/01/25  Weight change: Filed Weights   12/30/24 0500 12/31/24 0442 01/01/25 0500  Weight: 64.3 kg 62.8 kg 62 kg   Intake/Output:  Intake/Output Summary (Last 24 hours) at 01/01/2025 0909 Last data filed at 01/01/2025 0805 Gross per 24 hour  Intake 118 ml  Output 1650 ml  Net -1532 ml    Physical Exam  General:  Lying in bed. Looks better each day! Neck: no JVD.  Cor: Regular rate & rhythm. No murmurs. Lungs: breathing nonlabored Abdomen: nondistended, + ostomy Extremities: no edema Neuro: alert & orientedx3. Affect pleasant     Not on telemetry.  Labs    CBC Recent Labs    12/31/24 0522  WBC 12.8*  HGB 8.9*  HCT 26.7*  MCV 88.1  PLT 413*   Basic Metabolic Panel Recent Labs    98/91/73 0522 01/01/25 0551  NA 126* 130*  K 4.8 4.3  CL 94* 98  CO2 21* 21*  GLUCOSE 97 114*  BUN 22* 14  CREATININE 0.65 0.62  CALCIUM  9.8 9.0  PHOS  --  4.2   Liver Function Tests Recent Labs    01/01/25 0551  ALBUMIN  2.6*    ProBNP (last 3 results) Recent Labs    11/22/24 1958 12/29/24 1445  PROBNP 3,354.0* 282.0   Fasting Lipid Panel No results for input(s): CHOL, HDL, LDLCALC, TRIG, CHOLHDL, LDLDIRECT in the last 72 hours.  Medications:    Scheduled Medications:  (feeding supplement)  PROSource Plus  30 mL Oral BID BM   amiodarone   200 mg Oral BID   arformoterol   15 mcg Nebulization BID   colchicine   0.3 mg Oral Daily   [START ON 01/04/2025] cyanocobalamin   1,000 mcg Intramuscular Weekly   diphenoxylate -atropine   1 tablet Oral QID   empagliflozin   10 mg Oral Daily   feeding supplement  237 mL Oral BID BM   folic acid   1 mg Oral QHS   hydrocerin   Topical BID   lidocaine   1 patch Transdermal Q24H   loperamide   4 mg Oral Q6H   metoprolol  succinate  12.5 mg Oral Daily   multivitamin with minerals  1 tablet Oral Daily   mouth rinse  15 mL Mouth Rinse  4 times per day   pantoprazole   40 mg Oral Daily   polycarbophil  625 mg Per Tube Daily   QUEtiapine   25 mg Oral QHS   revefenacin   175 mcg Nebulization Daily   scopolamine   1 patch Transdermal Q72H   thiamine   100 mg Oral QHS   Warfarin - Pharmacist Dosing Inpatient   Does not apply q1600    Infusions:    PRN Medications: acetaminophen  (TYLENOL ) oral liquid 160 mg/5 mL, albuterol , alum & mag hydroxide-simeth, bisacodyl , diphenhydrAMINE , guaiFENesin -dextromethorphan, ondansetron  (ZOFRAN ) IV, oxyCODONE , polyethylene glycol, simethicone , sodium chloride , sodium phosphate , traZODone   Assessment/Plan   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  Valvular Disease  - severe MR posteriorly directed, mod TR. Likely functional.  - CMR - Severe AR/MR  - S/P bioprosthetic AVR/MVR 11/30/24 - Continue warfarin. INR 2.0  Acute hypoxic/hypercarbic respiratory failure - reintubated 12/21 due to hypercarbia   - s/p Trach 12/23 - Track capped and decannulated 12/28/24.   Acute Systolic Heart Failure w/ Biventricular Dysfunction >> Cardiogenic Shock - HS trop not c/w ACS but EKG w/ anteroseptal Qs  - CMRI Severe AR/MR. LVEF 35%  - Cath- normal cors, RA5, PA 32/17 (24), PVR 2.57 Papi 3. CO 4.9 CI 3.8 - Post-op Echo 12/02/24: EF < 20% with severe LVH (new, ?inflammatory), moderately decreased RV function, stable bioprosthetic MV and AoV.  - Impella removed 12/24  - Ltd echo 12/28: EF 40-45%, mildly reduced RV - Ltd echo 12/31/24: EF 30%, large pericardial effusion w/ no evidence of tamponade - Does not look volume overloaded. Has actually been on dry side. Got IVF back 1/8. No diuretics. - Off spiro d/t hyperkalemia. Retrial in future. Note had been on banatrol which has potassium in it. - Off losartan  per Nephrology for soft BP. Consider adding back as BP in coming days. - Stopped digoxin  with elevated level on low dose - Continue metoprolol  xl 12.5 mg daily - Continue Jardiance  10 mg daily  Ischemic  bowel - S/p exp/lap 12/11. Patchy ischemia of the distal ileum extending over about 75 cm without perforation. Ischemic segment resected.  Colon was distended with gas but viable. Small bowel left in discontinuity.  - Back to OR 12/15 for ischemic R colon and mesenteric ischemia. S/p exp/lap, wound vac exchange, R colectomy without anastomosis.  - Returned to OR 12/16 for ileostomy creation.  - GSU following. Ileostomy working well, output imrpoving - TPN and TF now off. CorTrak removed  R hemothorax: resolved - Hemothorax s/p right thoracentesis with intercostal artery injury. Massive bleeding requiring multiple products and development of hemorrhagic shock, now improved. S/p IR embolization.  - S/p VATS 12/18 with right chest washout.  - H/H stable. CT out.  ETOH Abuse - heavy drinker,  at least 5 drinks per evening - may be contributing to CM. Did not withdrawal - cessation needed after discharge   DMII  - Hgb A1C 6.1 - On SSI.  AKI - Resolved.  VT - Quiescent - continue amio 200 mg bid  Pericardial effusion - Limited echo 12/17 with moderate effusion.  - This was not drained at time of VATS/right hemothorax washout on 12/18. Appeared fibrinous. - Ltd echo 12/28 with increased, large pericardial effusion, no tamponade - Repeat limited echo 1/2 was stable - Limited echo 1/8 w/ large effusion, no tamponade - Continue colchicine . Checking CRP and ESR  Atrial flutter - Prev in/out of AFL with controlled rate.  - Remains in NSR. Continue amio 200 mg bid - Warfarin started 12/30, INR 2 today  Anemia -Hgb stable 8.9 on 1/8 -T sat 17 but ferritin high -Hold off on IV iron as he's received multiple transfusions  Length of Stay: 4   Torez Beauregard N, PA-C  01/01/2025, 9:09 AM  Advanced Heart Failure Team Pager [phone] (M-F; 7a - 5p)   Please visit [email or web address]: For overnight coverage please call cardiology fellow first. If fellow not available call Shock/ECMO MD on call.   For ECMO / Mechanical Support (Impella, IABP, LVAD) issues call Shock / ECMO MD on call.   "

## 2025-01-01 NOTE — Progress Notes (Signed)
 Patient ID: Alejandro Lopez, male   DOB: 12-19-1981, 44 y.o.   MRN: [id number] Met with the patient and mother (phone) to review current medical situation, rehab process, team conference and plan of care. Discussed medications and dietary modification recommendations for [facility] diet with increased protein and fruits/veg. Discussed management of ostomy and effects of food choices; patient ate a lot of meat, potatoes, breads and pasta with little vegetables PTA. Reviewed ETOH and smoking effects on the body.  Patient and wife to review abd. Dressing change and ostomy care again tomorrow in prep for discharge scheduled for 01/05/25. Continue to follow along to address educational needs to facilitate preparation for discharge. Alejandro Lopez

## 2025-01-01 NOTE — Progress Notes (Signed)
 ANTICOAGULATION CONSULT NOTE  Pharmacy Consult for warfarin Indication: bA/MVR, new AFL  Allergies[1]  Patient Measurements: Height: 5' 10 (177.8 cm) Weight: 62 kg (136 lb 11 oz) IBW/kg (Calculated) : 73 Heparin  Dosing Weight: 70 kg   Vital Signs: Temp: 98.3 F (36.8 C) (01/09 0555) Temp Source: Oral (01/09 0555) BP: 104/75 (01/09 0555) Pulse Rate: 105 (01/09 0555)  Labs: Recent Labs    12/30/24 0435 12/31/24 0522 01/01/25 0551  HGB  --  8.9*  --   HCT  --  26.7*  --   PLT  --  413*  --   LABPROT 16.4* 18.1* 23.4*  INR 1.3* 1.4* 2.0*  CREATININE 0.72 0.65 0.62    Estimated Creatinine Clearance: 102.3 mL/min (by C-G formula based on SCr of 0.62 mg/dL).  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  Postop course complicated by ischemic bowel s/p exlap 12/11 with partial small bowel resection, abdomen left open.  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 Impella 5.5 removed 12/24.  Was on TPN 12/12>>12/29.   Per Dr. Lucas, plan for 3 months of OAC with dual valve replacement. Not on anticoagulation prior to admission.  Pharmacy consulted for heparin  and warfarin dosing. Heparin  infusion paused 1/3 AM after BRBPR; another BRBM 1/4 PM per RN.  No plans to restart heparin  infusion per Dr. Lucas - plan to let INR trend up.  INR up from 1.4 to 2 (warfarin started 12/30). Oral intake variable - higher yesterday than today.  Notable DDIs - TPN off since 12/29, TF stopped 1/7 AM (changed to nocturnal feeds 1/6). Dietary supplements added 1/6 - none charted as given yet.  PO amiodarone  BID  Goal of Therapy:  INR 2-3 Monitor platelets by anticoagulation protocol: Yes   Plan:  Given INR jump, will hold warfarin tonight since not seeing full effect of warfarin 12.5 mg doses yet Daily INR, CBC, and s/sx of bleeding F/u PO  intake  Thank you for allowing pharmacy to participate in this patient's care,  Suzen Sour, PharmD, BCCCP Clinical Pharmacist  Phone: [phone] 01/01/2025 11:35 AM  Please check AMION for all [facility] Pharmacy phone numbers After 10:00 PM, call Main Pharmacy [phone]      [1]  Allergies Allergen Reactions   Porcine (Pork) Protein-Containing Drug Products Other (See Comments)

## 2025-01-01 NOTE — Progress Notes (Signed)
"    TCTS Subjective:  Feels well, just got back from therapy. He is walking well, eating fairly well now. Says he is going home next Tuesday. No SOB.  Objective: Vital signs in last 24 hours: Temp:  [98.2 F (36.8 C)-98.7 F (37.1 C)] 98.7 F (37.1 C) (01/09 1421) Pulse Rate:  [97-107] 107 (01/09 1421) Resp:  [18-19] 18 (01/09 1421) BP: (104-115)/(67-79) 104/79 (01/09 1421) SpO2:  [98 %-100 %] 99 % (01/09 1421) Weight:  [62 kg] 62 kg (01/09 0500)  Hemodynamic parameters for last 24 hours:    Intake/Output from previous day: 01/08 0701 - 01/09 0700 In: 236 [P.O.:236] Out: 1250 [Urine:750; Stool:500] Intake/Output this shift: Total I/O In: 118 [P.O.:118] Out: 400 [Urine:400]  General appearance: alert and cooperative Neurologic: intact Heart: regular rate and rhythm, S1, S2 normal, no murmur Lungs: diminished breath sounds RLL Extremities: no edema Wound: incisions healing well.  Lab Results: Recent Labs    12/31/24 0522  WBC 12.8*  HGB 8.9*  HCT 26.7*  PLT 413*   BMET:  Recent Labs    12/31/24 0522 01/01/25 0551  NA 126* 130*  K 4.8 4.3  CL 94* 98  CO2 21* 21*  GLUCOSE 97 114*  BUN 22* 14  CREATININE 0.65 0.62  CALCIUM  9.8 9.0    PT/INR:  Recent Labs    01/01/25 0551  LABPROT 23.4*  INR 2.0*   ABG    Component Value Date/Time   PHART 7.404 12/25/2024 0555   HCO3 21.1 12/25/2024 0555   TCO2 22 12/25/2024 0555   ACIDBASEDEF 3.0 (H) 12/25/2024 0555   O2SAT 96 12/25/2024 0555   CBG (last 3)  Recent Labs    12/29/24 2006  GLUCAP 124*    Assessment/Plan:  He seems to be making great progress in CIR. He has an appt with me in the office 1/28 for followup with a CXR.  LOS: 4 days    Dorise MARLA Fellers 01/01/2025   "

## 2025-01-01 NOTE — Progress Notes (Signed)
 "                                                        PROGRESS NOTE   Subjective/Complaints:  Off TF , off Banatrol, po intake is fair to good 35-75% yesterday Appreciate nephro and cardiology notes No pains, plans to have teaching for ileostomy care   Still with leukocytosis but no fever  ROS- neg CP, SOB, N/V/D  Objective:   ECHOCARDIOGRAM LIMITED Result Date: 12/31/2024    ECHOCARDIOGRAM LIMITED REPORT   Patient Name:   Alejandro Lopez Date of Exam: 12/31/2024 Medical Rec #:  [id number]        Height:       70.0 in Accession #:    [id number]       Weight:       138.4 lb Date of Birth:  12-16-1981       BSA:          1.785 m Patient Age:    44 years         BP:           115/85 mmHg Patient Gender: M                HR:           110 bpm. Exam Location:  Inpatient Procedure: Limited Echo, Cardiac Doppler and Color Doppler (Both Spectral and            Color Flow Doppler were utilized during procedure). Indications:    Pericardial effusion I31.3  History:        Patient has prior history of Echocardiogram examinations, most                 recent 12/25/2024. CHF, Pericardial Disease; Aortic Valve Disease                 and Mitral Valve Disease.                 Aortic Valve: 23 mm INSPIRIS RESILIA AORTIC VALVE 23mm                 Replacement valve is present in the aortic position. Procedure                 Date: 11/30/24.                 Mitral Valve: 27 mm MITRIS RESILIA MITRAL VALVE 27mm valve is                 present in the mitral position. Procedure Date: 11/30/24.  Sonographer:    Jayson Gaskins Referring Phys: MANUELITA GARRE [facility] IMPRESSIONS  1. Left ventricular ejection fraction, by estimation, is 30%.  2. The mitral valve has been repaired/replaced. The mean mitral valve gradient is 8.7 mmHg. There is a 27 mm MITRIS RESILIA MITRAL VALVE 27mm present in the mitral position. Procedure Date: 11/30/24.  3. Large pericardial effusion. The pericardial effusion is posterior to the left ventricle.  There is no evidence of cardiac tamponade.  4. The aortic valve has been repaired/replaced. There is a 23 mm INSPIRIS RESILIA AORTIC VALVE 23mm Replacement valve present in the aortic position. Procedure Date: 11/30/24.  5. The inferior vena cava is normal in size with greater than 50% respiratory variability, suggesting right  atrial pressure of 3 mmHg. Comparison(s): Prior images reviewed side by side. Changes from prior study are noted. LVEF slightly decreased compared to prior, though rate faster. Conclusion(s)/Recommendation(s): Large effusion remains, though IVC collapses, suggesting no tamponade. FINDINGS  Left Ventricle: Left ventricular ejection fraction, by estimation, is 30%. Abnormal (paradoxical) septal motion consistent with post-operative status. Pericardium: TV inflow velocities without significant respiratory variation. MV inflow velocities have dropout during respiratory variation, cannot tell if it is true variation or dropout. IVC collapses, supports no tamponade. A large pericardial effusion is present. The pericardial effusion is posterior to the left ventricle. There is no evidence of cardiac tamponade. Mitral Valve: The mitral valve has been repaired/replaced. There is a 27 mm MITRIS RESILIA MITRAL VALVE 27mm present in the mitral position. Procedure Date: 11/30/24. MV peak gradient, 18.5 mmHg. The mean mitral valve gradient is 8.7 mmHg with average heart rate of 108 bpm. Tricuspid Valve: The tricuspid valve is normal in structure. Tricuspid valve regurgitation is trivial. No evidence of tricuspid stenosis. Aortic Valve: The aortic valve has been repaired/replaced. There is a 23 mm INSPIRIS RESILIA AORTIC VALVE 23mm Replacement valve present in the aortic position. Procedure Date: 11/30/24. Venous: The inferior vena cava is normal in size with greater than 50% respiratory variability, suggesting right atrial pressure of 3 mmHg. LEFT VENTRICLE PLAX 2D LVIDd:         5.60 cm LVIDs:         4.50 cm  LV PW:         1.30 cm LV IVS:        1.10 cm LVOT diam:     1.80 cm LV SV:         58 LV SV Index:   33 LVOT Area:     2.54 cm  LEFT ATRIUM           Index LA Vol (A4C): 72.1 ml 40.39 ml/m  AORTIC VALVE LVOT Vmax:   160.00 cm/s LVOT Vmean:  120.000 cm/s LVOT VTI:    0.229 m MITRAL VALVE MV Area VTI:  1.68 cm    SHUNTS MV Peak grad: 18.5 mmHg   Systemic VTI:  0.23 m MV Mean grad: 8.7 mmHg    Systemic Diam: 1.80 cm MV Vmax:      2.15 m/s MV Vmean:     150.0 cm/s Shelda Bruckner MD Electronically signed by Shelda Bruckner MD Signature Date/Time: 12/31/2024/7:39:12 PM    Final    Recent Labs    12/31/24 0522  WBC 12.8*  HGB 8.9*  HCT 26.7*  PLT 413*   Recent Labs    12/31/24 0522 01/01/25 0551  NA 126* 130*  K 4.8 4.3  CL 94* 98  CO2 21* 21*  GLUCOSE 97 114*  BUN 22* 14  CREATININE 0.65 0.62  CALCIUM  9.8 9.0    Intake/Output Summary (Last 24 hours) at 01/01/2025 0804 Last data filed at 01/01/2025 0556 Gross per 24 hour  Intake --  Output 1250 ml  Net -1250 ml         Physical Exam: Vital Signs Blood pressure 104/75, pulse (!) 105, temperature 98.3 F (36.8 C), temperature source Oral, resp. rate 18, height 5' 10 (1.778 m), weight 62 kg, SpO2 100%.  ENT- trach site close , small scab, dressing re,moved Right internal jugular site CDI dressing removed  General: No acute distress Mood and affect are appropriate Heart: Regular rate and rhythm no rubs murmurs or extra sounds CHest incision healed  Lungs: Clear to  auscultation, breathing unlabored, no rales or wheezes Abdomen: Positive bowel sounds, midline incision ~12cm, midline figure of eight shaped with pink granulation tissues  Extremities: No clubbing, cyanosis, or edema Skin: No evidence of breakdown, no evidence of rash Neurologic: Cranial nerves II through XII intact, motor strength is 4/5 in bilateral deltoid, bicep, tricep, grip, hip flexor, knee extensors, ankle dorsiflexor and plantar  flexor   Musculoskeletal: Full range of motion in all 4 extremities. No joint swelling   Assessment/Plan: 1. Functional deficits which require 3+ hours per day of interdisciplinary therapy in a comprehensive inpatient rehab setting. Physiatrist is providing close team supervision and 24 hour management of active medical problems listed below. Physiatrist and rehab team continue to assess barriers to discharge/monitor patient progress toward functional and medical goals  Care Tool:  Bathing    Body parts bathed by patient: Right arm, Left arm, Chest, Abdomen, Front perineal area, Buttocks, Face, Left lower leg, Right lower leg, Left upper leg, Right upper leg         Bathing assist Assist Level: Supervision/Verbal cueing     Upper Body Dressing/Undressing Upper body dressing   What is the patient wearing?: Pull over shirt    Upper body assist Assist Level: Set up assist    Lower Body Dressing/Undressing Lower body dressing      What is the patient wearing?: Pants     Lower body assist Assist for lower body dressing: Supervision/Verbal cueing     Toileting Toileting    Toileting assist Assist for toileting: Supervision/Verbal cueing     Transfers Chair/bed transfer  Transfers assist     Chair/bed transfer assist level: Supervision/Verbal cueing     Locomotion Ambulation   Ambulation assist      Assist level: Contact Guard/Touching assist Assistive device: No Device Max distance: 150   Walk 10 feet activity   Assist     Assist level: Contact Guard/Touching assist Assistive device: No Device   Walk 50 feet activity   Assist    Assist level: Contact Guard/Touching assist Assistive device: No Device    Walk 150 feet activity   Assist    Assist level: Contact Guard/Touching assist Assistive device: No Device    Walk 10 feet on uneven surface  activity   Assist     Assist level: Contact Guard/Touching assist      Wheelchair     Assist Is the patient using a wheelchair?: Yes Type of Wheelchair: Manual    Wheelchair assist level: Dependent - Patient 0%      Wheelchair 50 feet with 2 turns activity    Assist        Assist Level: Dependent - Patient 0%   Wheelchair 150 feet activity     Assist      Assist Level: Dependent - Patient 0%   Blood pressure 104/75, pulse (!) 105, temperature 98.3 F (36.8 C), temperature source Oral, resp. rate 18, height 5' 10 (1.778 m), weight 62 kg, SpO2 100%.  Medical Problem List and Plan: 1. Functional deficits secondary to debility after septic shock, ischemic bowel and associated medical and surgical course             -patient may shower             -ELOS/Goals:1/13 mod I to supervision with PT, OT, SLP   2.  Antithrombotics: -DVT/anticoagulation:  Mechanical: Sequential compression devices, below knee Bilateral lower extremities Pharmaceutical: Coumadin              -  antiplatelet therapy: N/A   3. Pain Management: Lidoderm  patch.  As needed: Tylenol  and oxycodone .    4. Mood/Behavior/Sleep: LCSW to follow for evaluation and support when available.              - Agitation:  Seroquel  25 mg nightly   5. Neuropsych/cognition: This patient is capable of making decisions on his own behalf.   6. Skin/Wound Care: Routine pressure relief measures.  WOC consulted for ongoing management of ostomy and abdominal wound.  Monitor sites for infection.   7. Fluids/Electrolytes/Nutrition: Monitor strict I&O and daily weights.  Cortrak d/ed -Continue PPI   Dietary consult appreciated  8. Acute biventricular HFrEF with cardiogenic shock status post:  -warfarin for 3 months, heparin  bridge. Pharmacy to dose both.-Anticoagulation held because of blood in ostomy output. INR 1.1 -GDMT - dig, spiro, losartan .  -con't amiodarone  200mg  BID              -con't colchine for pericardial effusion  Appreciate cards f/u    9. Acute respiratory failure  with hypoxia and hypercapnia status post tracheostomy:  -Trach capped x 3 days. Decannulated  -occlusive dressing to trach stoma.  -con't Yupelri  and Brovan nebs. Encourage insp spiro.    10.Sepsis with septic shock due to recurrent aspiration pneumonia -Enterobacter:              -complete 7 days of ceftriaxone ; last day 12/29/24    11. 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 Anticipate he would benefit from Creon for short gut once off TF.  -may require vitamin B12 injections.  Monitor fat-soluble vitamin levels while here.   12. Anemia of critical illness and previous ABLA from hemothorax: Monitor CBC, transfuse Hb<7       Latest Ref Rng & Units 12/31/2024    5:22 AM 12/29/2024    5:38 AM 12/28/2024    1:19 AM  CBC  WBC 4.0 - 10.5 K/uL 12.8  11.2  11.8   Hemoglobin 13.0 - 17.0 g/dL 8.9  8.2  8.3   Hematocrit 39.0 - 52.0 % 26.7  24.5  25.4   Platelets 150 - 400 K/uL 413  421  424    Stable 1/6 13. Hyponatremia: Na 126         Latest Ref Rng & Units 01/01/2025    5:51 AM 12/31/2024    5:22 AM 12/30/2024    4:35 AM  BMP  Glucose 70 - 99 mg/dL 885  97  861   BUN 6 - 20 mg/dL 14  22  26    Creatinine 0.61 - 1.24 mg/dL 9.37  9.34  9.27   Sodium 135 - 145 mmol/L 130  126  127   Potassium 3.5 - 5.1 mmol/L 4.3  4.8  5.1   Chloride 98 - 111 mmol/L 98  94  94   CO2 22 - 32 mmol/L 21  21  21    Calcium  8.9 - 10.3 mg/dL 9.0  9.8  9.3   Hyper K+ resolved and  hypo Na+ improving but with normal renal function  Appreciate nephro assist  14.  Persistent leukocytosis without fever, has pericardial effusion without tamponade now on colchicine  will see if cards feels this may be source   LOS: 4 days A  FACE TO FACE EVALUATION WAS  PERFORMED  Prentice FORBES Compton 01/01/2025, 8:04 AM     "

## 2025-01-01 NOTE — Progress Notes (Addendum)
 Patient ID: Alejandro Lopez, male   DOB: May 21, 1981, 44 y.o.   MRN: [id number]  Have faxed the STD form completed by pt and have given the originals back to his wife for her file. She is observing in therapies today. Continue to try to find a home health agency to accept his referral.  12:26 PM Have made referral to [employer] for rollator and tub seat pt and wife agree with this and they are covered items. Pt doing well and much happier since Cortrak was removed.

## 2025-01-01 NOTE — Consult Note (Signed)
 WOCN Ostomy Care and Management Education Session  Notified nurse of ostomy teaching, coordinated care with OT, patient is due for discharge on Tuesday next week. Had patient call his wife to verify if she can come on Monday for full ostomy teaching session, ostomy appliance was changed out by nursing staff last night due to leakage, reviewed key concepts with patient in between therapy sessions, will set 1100 on 01/04/25 for teaching session with both CG and Patient. I will let therapy know. Patient has plenty of supplies at the beside, [employer], education folder and booklet.    Sherrilyn Hals MSN RN CWOCN WOC [facility]  [phone] (Available from 7-3 pm Mon-Friday)

## 2025-01-02 DIAGNOSIS — M7918 Myalgia, other site: Secondary | ICD-10-CM

## 2025-01-02 DIAGNOSIS — R451 Restlessness and agitation: Secondary | ICD-10-CM

## 2025-01-02 LAB — PROTIME-INR
INR: 2 — ABNORMAL HIGH (ref 0.8–1.2)
Prothrombin Time: 23.2 s — ABNORMAL HIGH (ref 11.4–15.2)

## 2025-01-02 MED ORDER — WARFARIN SODIUM 5 MG PO TABS
5.0000 mg | ORAL_TABLET | Freq: Once | ORAL | Status: AC
  Administered 2025-01-02: 5 mg via ORAL
  Filled 2025-01-02: qty 1

## 2025-01-02 NOTE — Progress Notes (Signed)
 Physical Therapy Session Note  Patient Details  Name: Alejandro Lopez MRN: [id number] Date of Birth: Aug 19, 1981  Today's Date: 01/02/2025 PT Individual Time: 1115-1155 PT Individual Time Calculation (min): 40 min   Short Term Goals: Week 1:  PT Short Term Goal 1 (Week 1): STG=LTG due to ELOS  Skilled Therapeutic Interventions/Progress Updates:   Received pt semi-reclined in bed, pt agreeable to PT treatment, and reported pain 6/10 in chest - RN notified and present to administer pain medication. Session with emphasis on functional mobility/transfers, generalized strengthening and endurance, dynamic standing balance/coordination, and ambulation. Pt performed bed mobility with supervision with good adherence to sternal precautions. Pt performed all transfers with and without AD and supervision throughout session. Pt requested to use rollator to go to gym and ambulated >316ft x 2 trials with rollator and supervision to/from dayroom. Pt performed seated BLE strengthening on Nustep on Pace Partner at level 2 for 5 minutes with 1 rest break with emphasis on cardiovascular endurance - total of 372 steps, 0.2 miles, 71 SPM, and 1.8 METs. Pt then performed forward high knee marching x 4 laps in agility ladder with CGA for balance, then transitioned to lateral side stepping x 4 laps with CGA for balance (pt demo 1 LOB requiring min A to correct). Returned to room and concluded session with pt sitting EOB with all needs within reach.   Therapy Documentation Precautions:  Precautions Precautions: Fall, Sternal Precaution/Restrictions Comments: Feeding tube, wound vac, ostomy Restrictions Weight Bearing Restrictions Per Provider Order: Yes RUE Weight Bearing Per Provider Order: Non weight bearing LUE Weight Bearing Per Provider Order: Non weight bearing Other Position/Activity Restrictions: sternal precautions  Therapy/Group: Individual Therapy Therisa CHRISTELLA Zaunegger Therisa Stains PT, DPT 01/02/2025, 6:46  AM

## 2025-01-02 NOTE — Progress Notes (Signed)
 "                                                        PROGRESS NOTE   Subjective/Complaints:  Pt feeling well. Just finished breakfast. Denies pain. Happy with rehab progress. Did not mention any neck pain to me but apparently received TPI's yesterday. Can't tell if they helped yet as he hasn't started moving yet   ROS: Patient denies fever, rash, sore throat, blurred vision, dizziness, nausea, vomiting, diarrhea, cough, shortness of breath or chest pain, joint or back/neck pain, headache, or mood change.   Objective:   ECHOCARDIOGRAM LIMITED Result Date: 12/31/2024    ECHOCARDIOGRAM LIMITED REPORT   Patient Name:   Alejandro Lopez Date of Exam: 12/31/2024 Medical Rec #:  [id number]        Height:       70.0 in Accession #:    [id number]       Weight:       138.4 lb Date of Birth:  [birth year]       BSA:          1.785 m Patient Age:    44 years         BP:           115/85 mmHg Patient Gender: M                HR:           110 bpm. Exam Location:  Inpatient Procedure: Limited Echo, Cardiac Doppler and Color Doppler (Both Spectral and            Color Flow Doppler were utilized during procedure). Indications:    Pericardial effusion I31.3  History:        Patient has prior history of Echocardiogram examinations, most                 recent 12/25/2024. CHF, Pericardial Disease; Aortic Valve Disease                 and Mitral Valve Disease.                 Aortic Valve: 23 mm INSPIRIS RESILIA AORTIC VALVE 23mm                 Replacement valve is present in the aortic position. Procedure                 Date: 11/30/24.                 Mitral Valve: 27 mm MITRIS RESILIA MITRAL VALVE 27mm valve is                 present in the mitral position. Procedure Date: 11/30/24.  Sonographer:    Jayson Gaskins Referring Phys: MANUELITA GARRE [facility] IMPRESSIONS  1. Left ventricular ejection fraction, by estimation, is 30%.  2. The mitral valve has been repaired/replaced. The mean mitral valve gradient is 8.7 mmHg. There is  a 27 mm MITRIS RESILIA MITRAL VALVE 27mm present in the mitral position. Procedure Date: 11/30/24.  3. Large pericardial effusion. The pericardial effusion is posterior to the left ventricle. There is no evidence of cardiac tamponade.  4. The aortic valve has been repaired/replaced. There is a 23 mm INSPIRIS RESILIA AORTIC VALVE 23mm Replacement  valve present in the aortic position. Procedure Date: 11/30/24.  5. The inferior vena cava is normal in size with greater than 50% respiratory variability, suggesting right atrial pressure of 3 mmHg. Comparison(s): Prior images reviewed side by side. Changes from prior study are noted. LVEF slightly decreased compared to prior, though rate faster. Conclusion(s)/Recommendation(s): Large effusion remains, though IVC collapses, suggesting no tamponade. FINDINGS  Left Ventricle: Left ventricular ejection fraction, by estimation, is 30%. Abnormal (paradoxical) septal motion consistent with post-operative status. Pericardium: TV inflow velocities without significant respiratory variation. MV inflow velocities have dropout during respiratory variation, cannot tell if it is true variation or dropout. IVC collapses, supports no tamponade. A large pericardial effusion is present. The pericardial effusion is posterior to the left ventricle. There is no evidence of cardiac tamponade. Mitral Valve: The mitral valve has been repaired/replaced. There is a 27 mm MITRIS RESILIA MITRAL VALVE 27mm present in the mitral position. Procedure Date: 11/30/24. MV peak gradient, 18.5 mmHg. The mean mitral valve gradient is 8.7 mmHg with average heart rate of 108 bpm. Tricuspid Valve: The tricuspid valve is normal in structure. Tricuspid valve regurgitation is trivial. No evidence of tricuspid stenosis. Aortic Valve: The aortic valve has been repaired/replaced. There is a 23 mm INSPIRIS RESILIA AORTIC VALVE 23mm Replacement valve present in the aortic position. Procedure Date: 11/30/24. Venous: The  inferior vena cava is normal in size with greater than 50% respiratory variability, suggesting right atrial pressure of 3 mmHg. LEFT VENTRICLE PLAX 2D LVIDd:         5.60 cm LVIDs:         4.50 cm LV PW:         1.30 cm LV IVS:        1.10 cm LVOT diam:     1.80 cm LV SV:         58 LV SV Index:   33 LVOT Area:     2.54 cm  LEFT ATRIUM           Index LA Vol (A4C): 72.1 ml 40.39 ml/m  AORTIC VALVE LVOT Vmax:   160.00 cm/s LVOT Vmean:  120.000 cm/s LVOT VTI:    0.229 m MITRAL VALVE MV Area VTI:  1.68 cm    SHUNTS MV Peak grad: 18.5 mmHg   Systemic VTI:  0.23 m MV Mean grad: 8.7 mmHg    Systemic Diam: 1.80 cm MV Vmax:      2.15 m/s MV Vmean:     150.0 cm/s Shelda Bruckner MD Electronically signed by Shelda Bruckner MD Signature Date/Time: 12/31/2024/7:39:12 PM    Final    Recent Labs    12/31/24 0522  WBC 12.8*  HGB 8.9*  HCT 26.7*  PLT 413*   Recent Labs    12/31/24 0522 01/01/25 0551  NA 126* 130*  K 4.8 4.3  CL 94* 98  CO2 21* 21*  GLUCOSE 97 114*  BUN 22* 14  CREATININE 0.65 0.62  CALCIUM  9.8 9.0    Intake/Output Summary (Last 24 hours) at 01/02/2025 0836 Last data filed at 01/02/2025 0500 Gross per 24 hour  Intake 287.66 ml  Output 1450 ml  Net -1162.34 ml         Physical Exam: Vital Signs Blood pressure (!) 95/56, pulse 94, temperature 98 F (36.7 C), temperature source Oral, resp. rate 16, height 5' 10 (1.778 m), weight 65.4 kg, SpO2 99%.  Constitutional: No distress . Vital signs reviewed. HEENT: NCAT, EOMI, oral membranes moist Neck: trach stoma  closed Cardiovascular: RRR without murmur. No JVD    Respiratory/Chest: CTA Bilaterally without wheezes or rales. Normal effort    GI/Abdomen: BS +, non-tender, non-distended Ext: no clubbing, cyanosis, or edema Psych: pleasant and cooperative  Skin: No evidence of breakdown, no evidence of rash. Abdominal incision healing Neurologic: Cranial nerves II through XII intact, motor strength is 4/5 in bilateral  deltoid, bicep, tricep, grip, hip flexor, knee extensors, ankle dorsiflexor and plantar flexor   Musculoskeletal: Full range of motion in all 4 extremities. No joint swelling   Assessment/Plan: 1. Functional deficits which require 3+ hours per day of interdisciplinary therapy in a comprehensive inpatient rehab setting. Physiatrist is providing close team supervision and 24 hour management of active medical problems listed below. Physiatrist and rehab team continue to assess barriers to discharge/monitor patient progress toward functional and medical goals  Care Tool:  Bathing    Body parts bathed by patient: Right arm, Left arm, Chest, Abdomen, Front perineal area, Buttocks, Face, Left lower leg, Right lower leg, Left upper leg, Right upper leg         Bathing assist Assist Level: Set up assist     Upper Body Dressing/Undressing Upper body dressing   What is the patient wearing?: Pull over shirt    Upper body assist Assist Level: Independent    Lower Body Dressing/Undressing Lower body dressing      What is the patient wearing?: Pants     Lower body assist Assist for lower body dressing: Supervision/Verbal cueing     Toileting Toileting    Toileting assist Assist for toileting: Supervision/Verbal cueing     Transfers Chair/bed transfer  Transfers assist     Chair/bed transfer assist level: Supervision/Verbal cueing     Locomotion Ambulation   Ambulation assist      Assist level: Contact Guard/Touching assist Assistive device: No Device Max distance: 150   Walk 10 feet activity   Assist     Assist level: Contact Guard/Touching assist Assistive device: No Device   Walk 50 feet activity   Assist    Assist level: Contact Guard/Touching assist Assistive device: No Device    Walk 150 feet activity   Assist    Assist level: Contact Guard/Touching assist Assistive device: No Device    Walk 10 feet on uneven surface   activity   Assist     Assist level: Contact Guard/Touching assist     Wheelchair     Assist Is the patient using a wheelchair?: Yes Type of Wheelchair: Manual    Wheelchair assist level: Dependent - Patient 0%      Wheelchair 50 feet with 2 turns activity    Assist        Assist Level: Dependent - Patient 0%   Wheelchair 150 feet activity     Assist      Assist Level: Dependent - Patient 0%   Blood pressure (!) 95/56, pulse 94, temperature 98 F (36.7 C), temperature source Oral, resp. rate 16, height 5' 10 (1.778 m), weight 65.4 kg, SpO2 99%.  Medical Problem List and Plan: 1. Functional deficits secondary to debility after septic shock, ischemic bowel and associated medical and surgical course             -patient may shower             -ELOS/Goals:1/13 mod I to supervision with PT, OT, SLP  -Continue CIR therapies including PT, OT, and SLP   2.  Antithrombotics: -DVT/anticoagulation:  Mechanical: Sequential compression  devices, below knee Bilateral lower extremities Pharmaceutical: Coumadin              -antiplatelet therapy: N/A   3. Pain Management: Lidoderm  patch.  As needed: Tylenol  and oxycodone .    1/10 myofasical shoulder girdle pain?   -TPI's yesterday   -monitor for effect 4. Mood/Behavior/Sleep: LCSW to follow for evaluation and support when available.              - Agitation:  Seroquel  25 mg nightly   5. Neuropsych/cognition: This patient is capable of making decisions on his own behalf.   6. Skin/Wound Care: Routine pressure relief measures.  WOC consulted for ongoing management of ostomy and abdominal wound.  Monitor sites for infection.   1/10 sites closing, clean   7. Fluids/Electrolytes/Nutrition: Monitor strict I&O and daily weights.  Cortrak d/ed -Continue PPI   Dietary consult appreciated  8. Acute biventricular HFrEF with cardiogenic shock status post:  -warfarin for 3 months, heparin  bridge. Pharmacy to dose  both.-Anticoagulation held because of blood in ostomy output. INR 1.1 -GDMT - dig, spiro, losartan .  -con't amiodarone  200mg  BID              -con't colchine for pericardial effusion  Appreciate cards f/u    9. Acute respiratory failure with hypoxia and hypercapnia status post tracheostomy:  -Trach capped x 3 days. Decannulated  -occlusive dressing to trach stoma.  -con't Yupelri  and Brovan nebs. Encourage insp spiro.    10.Sepsis with septic shock due to recurrent aspiration pneumonia -Enterobacter:              -complete 7 days of ceftriaxone ; last day 12/29/24    11. 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 Anticipate he would benefit from Creon for short gut once off TF.  -may require vitamin B12 injections.  Monitor fat-soluble vitamin levels while here.--labs pending for Monday    12. Anemia of critical illness and previous ABLA from hemothorax: Monitor CBC, transfuse Hb<7       Latest Ref Rng & Units 12/31/2024    5:22 AM 12/29/2024    5:38 AM 12/28/2024    1:19 AM  CBC  WBC 4.0 - 10.5 K/uL 12.8  11.2  11.8   Hemoglobin 13.0 - 17.0 g/dL 8.9  8.2  8.3   Hematocrit 39.0 - 52.0 % 26.7  24.5  25.4   Platelets 150 - 400 K/uL 413  421  424    Stable 1/8 13. Hyponatremia: Na 130         Latest Ref Rng & Units 01/01/2025    5:51 AM 12/31/2024    5:22 AM 12/30/2024    4:35 AM  BMP  Glucose 70 - 99 mg/dL 885  97  861   BUN 6 - 20 mg/dL 14  22  26    Creatinine 0.61 - 1.24 mg/dL 9.37  9.34  9.27   Sodium 135 - 145 mmol/L 130  126  127   Potassium 3.5 - 5.1 mmol/L 4.3  4.8  5.1   Chloride 98 - 111 mmol/L 98  94  94   CO2 22 - 32 mmol/L 21  21  21    Calcium  8.9 - 10.3 mg/dL 9.0  9.8  9.3   Hyper K+ resolved  and  hypo Na+  improving but with normal renal function  Appreciate nephro assist  14.  Persistent leukocytosis without fever, has pericardial effusion without tamponade now on colchicine  will see if cards feels this may be source   1/10 CT surgery saw yesterday. No changes in plan    LOS: 5 days A FACE TO FACE EVALUATION WAS PERFORMED  Arthea ONEIDA Gunther 01/02/2025, 8:36 AM     "

## 2025-01-02 NOTE — Plan of Care (Signed)
" °  Problem: Consults Goal: RH GENERAL PATIENT EDUCATION Description: See Patient Education module for education specifics. Outcome: Progressing Goal: Skin Care Protocol Initiated - if Braden Score 18 or less Description: If consults are not indicated, leave blank or document N/A Outcome: Progressing Goal: Nutrition Consult-if indicated Outcome: Progressing   Problem: RH BOWEL ELIMINATION Goal: RH STG MANAGE BOWEL WITH ASSISTANCE Description: STG Manage Bowel with mod I Assistance. Outcome: Progressing   Problem: RH SKIN INTEGRITY Goal: RH STG SKIN FREE OF INFECTION/BREAKDOWN Description: Manage skin w min assist Outcome: Progressing Goal: RH STG MAINTAIN SKIN INTEGRITY WITH ASSISTANCE Description: STG Maintain Skin Integrity With min Assistance. Outcome: Progressing Goal: RH STG ABLE TO PERFORM INCISION/WOUND CARE W/ASSISTANCE Description: STG Able To Perform Incision/Wound Care With min Assistance. Outcome: Progressing   Problem: RH SAFETY Goal: RH STG ADHERE TO SAFETY PRECAUTIONS W/ASSISTANCE/DEVICE Description: STG Adhere to Safety Precautions With cues Assistance/Device. Outcome: Progressing   Problem: RH PAIN MANAGEMENT Goal: RH STG PAIN MANAGED AT OR BELOW PT'S PAIN GOAL Description: Pain < 4 with prns Outcome: Progressing   Problem: RH KNOWLEDGE DEFICIT GENERAL Goal: RH STG INCREASE KNOWLEDGE OF SELF CARE AFTER HOSPITALIZATION Description: Patient and spouse will be able to manage care at discharge using educational resources for medications, dietary modification and skin care independently Outcome: Progressing   "

## 2025-01-02 NOTE — Progress Notes (Signed)
 ANTICOAGULATION CONSULT NOTE  Pharmacy Consult for warfarin Indication: bA/MVR, new AFL  Allergies[1]  Patient Measurements: Height: 5' 10 (177.8 cm) Weight: 65.4 kg (144 lb 2.9 oz) IBW/kg (Calculated) : 73 Heparin  Dosing Weight: 70 kg   Vital Signs: Temp: 98 F (36.7 C) (01/10 0406) Temp Source: Oral (01/10 0406) BP: 95/56 (01/10 0815) Pulse Rate: 94 (01/10 0815)  Labs: Recent Labs    12/31/24 0522 01/01/25 0551 01/02/25 0604  HGB 8.9*  --   --   HCT 26.7*  --   --   PLT 413*  --   --   LABPROT 18.1* 23.4* 23.2*  INR 1.4* 2.0* 2.0*  CREATININE 0.65 0.62  --     Estimated Creatinine Clearance: 107.9 mL/min (by C-G formula based on SCr of 0.62 mg/dL).  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  Postop course complicated by ischemic bowel s/p exlap 12/11 with partial small bowel resection, abdomen left open.  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 Impella 5.5 removed 12/24.  Was on TPN 12/12>>12/29.   Per Dr. Lucas, plan for 3 months of OAC with dual valve replacement. Not on anticoagulation prior to admission.  Pharmacy consulted for heparin  and warfarin dosing. Heparin  infusion paused 1/3 AM after BRBPR; another BRBM 1/4 PM per RN.  No plans to restart heparin  infusion per Dr. Lucas - plan to let INR trend up.  INR up from 1.4 to 2 x 2 days (warfarin started 12/30). Oral intake variable - higher yesterday than today.  Notable DDIs - TPN off since 12/29, TF stopped 1/7 AM (changed to nocturnal feeds 1/6). Dietary supplements added 1/6 - none charted as given yet.  PO amiodarone  BID  1/10: Given INR stable today and therapeutic at 2.0 for second day will give dose of warfarin.   Goal of Therapy:  INR 2-3 Monitor platelets by anticoagulation protocol: Yes   Plan:  INR today stable at 2.0 for second day. Will give  5mg  dose. Daily INR, CBC, and s/sx of bleeding F/u PO intake  Thank you for allowing pharmacy to participate in this patient's care,  Larraine Brazier, PharmD Clinical Pharmacist 01/02/2025  10:06 AM **Pharmacist phone directory can now be found on [email or web address] ([email or web address]).  Listed under [facility].         [1]  Allergies Allergen Reactions   Porcine (Pork) Protein-Containing Drug Products Other (See Comments)

## 2025-01-02 NOTE — Progress Notes (Signed)
 Occupational Therapy Session Note  Patient Details  Name: Alejandro Lopez MRN: [id number] Date of Birth: [birth year]  Today's Date: 01/02/2025 OT Individual Time: 0915-1000 and 1415-1510 OT Individual Time Calculation (min): 45 min  and 55 min   Today's Date: 01/02/2025 OT Missed Time: 15 Minutes Missed Time Reason: Nursing care (breathing treatment)   Short Term Goals: Week 1:  OT Short Term Goal 1 (Week 1): STG=LTG d/t ELOS  Skilled Therapeutic Interventions/Progress Updates:    Visit 1:  Pain: Pain Assessment Pain Scale: 0-10 Pain Score: 7  Pain Location: Abdomen Pain Intervention(s): Medication (See eMAR)  Pt received in room with nursing staff receiving breathing treatment which took a few minutes.  Nursing had planned to change abdominal dressing and they were agreeable to working together in the session to have pt start to practice the process. Pt wanted to use the bathroom first.  Ambulated without AD to bathroom to stand to urinate. Pt wanted to empty ostomy bag, so he sat on toilet and donned gloves.  He needed min cues with how to pinch open end of pouch but then he was able to drain it into the toilet, cleanse the bag and fold it back up.   Ambulated to chair at sink to work on abdominal wound care with nursing.  Pt engaged in task and once finished bathed and dressed at the sink.    Towards end of session he was pretty fatigued so encouraged him to lay down to rest.   In bed with all needs met.  Bandage not sticking well to abdomen so informed RN.   Visit 2: no c/o pain  Pt received in bed, wife sleeping.  Told pt he did not need to wake her as she has seen a lot of the therapy session and it was ok for her to rest.  Pt wanted to toilet, ambulated to bathroom with distant S and stood to urinate, ambulated to sink to wash hands.  He did not need a sit down break.  Used rollator to ambulate to [employer] ( over 570 feet),  sat to rest and talk about his experience  with rehab, ambulated back to main gym and sat on mat to work on HEP using blue resistance band. Pt on sternal precautions, so UE ex are limited. Worked on bicep and tricep curls,  using band around foot for resisted knee flexion.   Cues to sit up straight and tilt pelvis forward to help with posture. Pt ambulated back to room with all needs met.   Therapy Documentation Precautions:  Precautions Precautions: Fall, Sternal Precaution/Restrictions Comments: Feeding tube, wound vac, ostomy Restrictions Weight Bearing Restrictions Per Provider Order: Yes RUE Weight Bearing Per Provider Order: Non weight bearing LUE Weight Bearing Per Provider Order: Non weight bearing Other Position/Activity Restrictions: sternal precautions General: General OT Amount of Missed Time: 15 Minutes      ADL: ADL Eating: Independent Grooming: Independent Where Assessed-Grooming: Standing at sink Upper Body Bathing: Independent Where Assessed-Upper Body Bathing: Standing at sink, Sitting at sink Lower Body Bathing: Independent Where Assessed-Lower Body Bathing: Standing at sink, Sitting at sink Upper Body Dressing: Independent Where Assessed-Upper Body Dressing: Chair Lower Body Dressing: Modified independent Where Assessed-Lower Body Dressing: Chair Toileting: Modified independent, Supervision/safety (mod ind to stand to urinate, min cues with ostomy bag management) Where Assessed-Toileting: Teacher, Adult Education: Distant supervision Statistician Method: Event Organiser: Close supervision Film/video Editor Method: Designer, Industrial/product: Emergency planning/management officer, Grab bars  Therapy/Group: Individual Therapy  Micaylah Bertucci 01/02/2025, 12:39 PM

## 2025-01-03 LAB — PROTIME-INR
INR: 2.1 — ABNORMAL HIGH (ref 0.8–1.2)
Prothrombin Time: 24.8 s — ABNORMAL HIGH (ref 11.4–15.2)

## 2025-01-03 MED ORDER — POLYETHYLENE GLYCOL 3350 17 G PO PACK: 17.0000 g | PACK | Freq: Every day | ORAL | Status: DC | PRN

## 2025-01-03 MED ORDER — WARFARIN SODIUM 7.5 MG PO TABS
7.5000 mg | ORAL_TABLET | Freq: Once | ORAL | Status: AC
  Administered 2025-01-03: 7.5 mg via ORAL
  Filled 2025-01-03: qty 1

## 2025-01-03 MED ORDER — BUPIVACAINE-EPINEPHRINE (PF) 0.25% -1:200000 IJ SOLN
INTRAMUSCULAR | Status: AC
  Filled 2025-01-03: qty 30

## 2025-01-03 MED ORDER — BUPIVACAINE-EPINEPHRINE (PF) 0.5% -1:200000 IJ SOLN
INTRAMUSCULAR | Status: AC
  Filled 2025-01-03: qty 30

## 2025-01-03 MED ORDER — CALCIUM POLYCARBOPHIL 625 MG PO TABS
625.0000 mg | ORAL_TABLET | Freq: Every day | ORAL | Status: DC
  Administered 2025-01-04 – 2025-01-05 (×2): 625 mg via ORAL
  Filled 2025-01-03 (×3): qty 1

## 2025-01-03 NOTE — Progress Notes (Signed)
 Occupational Therapy Session Note  Patient Details  Name: Alejandro Lopez MRN: [id number] Date of Birth: November 28, 1981  Today's Date: 01/03/2025 OT Individual Time: 0900-1005 OT Individual Time Calculation (min): 65 min    Short Term Goals: Week 1:  OT Short Term Goal 1 (Week 1): STG=LTG d/t ELOS  Skilled Therapeutic Interventions/Progress Updates:      Initial Encounter:  Patient resting in bed at the time of arrival watching TV. The reported a pain response onf 6 on 0-10 for abdomin region. The patient indicated that nursing provided medication to address his pain, however, the pt went to say that he needed a wound change,nursing was able to come in promptly to address his concerns. The patient was in agreement with completing static/dynamic sit to stand, act tol, and wound care process.     BADL Related Task:  Prior to getting started the patient explained the importance of change his dressing in a clean environment inclusive of gloves. The pt understood the concept of a wet dry application to promote healing. The pt went on to work on sit to stand and was instructed regarding the importance of analyzing how he feel when coming from sit to stand prior to proceeding to take a steep to reduce his instances for falls. The pt was able to come from supine in bed to EOB with closeS, the pt was able to come from EOB to standing using the RW for maintain static standing  with closeS. The pt went was able to ambulate from his room to the main entrance with CGA and 1 rest brake. The pt was able to transfer from standing using the rollator to sit  incorporating relaxation breathing for > compliance. After taking rest break, the pt was able to ambulate back to his room with placement EOB at close S with the call light and bedside table within reach and all additional needs addressed.  Therapy Documentation Precautions:  Precautions Precautions: Fall, Sternal Precaution/Restrictions Comments: Feeding  tube, wound vac, ostomy Restrictions Weight Bearing Restrictions Per Provider Order: Yes RUE Weight Bearing Per Provider Order: Non weight bearing LUE Weight Bearing Per Provider Order: Non weight bearing Other Position/Activity Restrictions: sternal precautions   Therapy/Group: Individual Therapy  [name] 01/03/2025, 4:28 PM

## 2025-01-03 NOTE — Progress Notes (Signed)
 ANTICOAGULATION CONSULT NOTE  Pharmacy Consult for warfarin Indication: bA/MVR, new AFL  Allergies[1]  Patient Measurements: Height: 5' 10 (177.8 cm) Weight: 63.6 kg (140 lb 3.4 oz) IBW/kg (Calculated) : 73 Heparin  Dosing Weight: 70 kg   Vital Signs: Temp: 98.4 F (36.9 C) (01/11 0524) Temp Source: Oral (01/11 0524) BP: 105/66 (01/11 0524) Pulse Rate: 92 (01/11 0524)  Labs: Recent Labs    01/01/25 0551 01/02/25 0604 01/03/25 0605  LABPROT 23.4* 23.2* 24.8*  INR 2.0* 2.0* 2.1*  CREATININE 0.62  --   --     Estimated Creatinine Clearance: 104.9 mL/min (by C-G formula based on SCr of 0.62 mg/dL).  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  Postop course complicated by ischemic bowel s/p exlap 12/11 with partial small bowel resection, abdomen left open.  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 Impella 5.5 removed 12/24.  Was on TPN 12/12>>12/29.   Per Dr. Lucas, plan for 3 months of OAC with dual valve replacement. Not on anticoagulation prior to admission.  Pharmacy consulted for heparin  and warfarin dosing. Heparin  infusion paused 1/3 AM after BRBPR; another BRBM 1/4 PM per RN.  No plans to restart heparin  infusion per Dr. Lucas - plan to let INR trend up.  INR up from 1.4 to 2 x 2 days (warfarin started 12/30). Oral intake variable - higher yesterday than today.  Notable DDIs - TPN off since 12/29, TF stopped 1/7 AM (changed to nocturnal feeds 1/6). Dietary supplements added 1/6 - none charted as given yet.  PO amiodarone  BID  1/11: INR 2.1 today. Given that it required an average dose around 7.5 mg over the course of 10 days to get in therapeutic range will give 7.5 mg today so try and avoid decreasing INR after the 1 day hold on 1/9 and lower dose of 5mg  yesterday  Goal of Therapy:  INR 2-3 Monitor platelets by  anticoagulation protocol: Yes   Plan:  INR today stable at 2.1. Will give 7.5mg  dose. Daily INR, CBC, and s/sx of bleeding F/u PO intake  Thank you for allowing pharmacy to participate in this patient's care,  Larraine Brazier, PharmD Clinical Pharmacist 01/03/2025  9:36 AM **Pharmacist phone directory can now be found on [email or web address] ([email or web address]).  Listed under [facility].          [1]  Allergies Allergen Reactions   Porcine (Pork) Protein-Containing Drug Products Other (See Comments)

## 2025-01-03 NOTE — Progress Notes (Signed)
 Physical Therapy Session Note  Patient Details  Name: Alejandro Lopez MRN: [id number] Date of Birth: [birth year]  Today's Date: 01/03/2025 PT Individual Time: 0700-0754 PT Individual Time Calculation (min): 54 min   Short Term Goals: Week 1:  PT Short Term Goal 1 (Week 1): STG=LTG due to ELOS  Skilled Therapeutic Interventions/Progress Updates:    Pt presents in bed, agreeable to session. Performed bed mobility independenty to come to EOB. Using rollator to ambulate to gym for energy conservation x 150' x 2 with S and cues for upright posture and overall functional strengthening. Noted to have slower overall cadence and decreased step length  NMR for dynamic balance and gait through obstacle course navigating cones and stepping over objects and then completed with toe taps to cone and stepping over cone without AD for challenge - overall close S/CGA for balance. Pt requires slightly more assist with balance when lifting LLE/single limb stance on R.  Administered fall risk assessments for balance assessment - see below for results.  NMR for functional strengthening and balance including heel raises x 20, toe raises x 20. Retro gait x 15' with focus on increasing step length and fluid pattern  Stair negotiation training for strengthening and endurance x 12 steps with rails for support at S level.  D/c planning and f/u therapy recommendations discussed (OPPT). Pt denies concerns with upcoming d/c, mainly concerned about wound care.  Returned to room and back to bed with all needs in reach.     Therapy Documentation Precautions:  Precautions Precautions: Fall, Sternal Precaution/Restrictions Comments: Feeding tube, wound vac, ostomy Restrictions Weight Bearing Restrictions Per Provider Order: Yes RUE Weight Bearing Per Provider Order: Non weight bearing LUE Weight Bearing Per Provider Order: Non weight bearing Other Position/Activity Restrictions: sternal precautions  Pain: 6/10  pain in chest and abdomen - notified nursing for medication.       Balance: Standardized Balance Assessment Standardized Balance Assessment: Timed Up and Go Test Timed Up and Go Test TUG: Normal TUG Normal TUG (seconds): 23 (no AD)   Five times Sit to Stand Test (FTSS) Method: Use a straight back chair with a solid seat that is 16-18 high. Ask participant to sit on the chair with arms folded across their chest.   Instructions: Stand up and sit down as quickly as possible 5 times, keeping your arms folded across your chest.   Measurement: Stop timing when the participant stands the 5th time.  TIME: ___16___ (in seconds)  Times > 13.6 seconds is associated with increased disability and morbidity (Guralnik, 2000) Times > 15 seconds is predictive of recurrent falls in healthy individuals aged 17 and older (Buatois, et al., 2008) Normal performance values in community dwelling individuals aged 43 and older (Bohannon, 2006): 60-69 years: 11.4 seconds 70-79 years: 12.6 seconds 80-89 years: 14.8 seconds  MCID: >= 2.3 seconds for Vestibular Disorders (Meretta, 2006)  *noted to maintain very wide BOS with sit <> stands   Therapy/Group: Individual Therapy  Elnor Pizza Sherrell Pizza WENDI Elnor, PT, DPT, CBIS  01/03/2025, 7:57 AM

## 2025-01-03 NOTE — Progress Notes (Signed)
 "                                                        PROGRESS NOTE   Subjective/Complaints:  Pt slept well. I asked him how his neck was doing and he said TPI's were helpful.  ROS: Patient denies fever, rash, sore throat, blurred vision, dizziness, nausea, vomiting, diarrhea, cough, shortness of breath or chest pain, joint or back/neck pain, headache, or mood change.   Objective:   No results found.  No results for input(s): WBC, HGB, HCT, PLT in the last 72 hours.  Recent Labs    01/01/25 0551  NA 130*  K 4.3  CL 98  CO2 21*  GLUCOSE 114*  BUN 14  CREATININE 0.62  CALCIUM  9.0    Intake/Output Summary (Last 24 hours) at 01/03/2025 0807 Last data filed at 01/03/2025 0530 Gross per 24 hour  Intake 480 ml  Output 975 ml  Net -495 ml         Physical Exam: Vital Signs Blood pressure 105/66, pulse 92, temperature 98.4 F (36.9 C), temperature source Oral, resp. rate 17, height 5' 10 (1.778 m), weight 63.6 kg, SpO2 99%.  Constitutional: No distress . Vital signs reviewed. HEENT: NCAT, EOMI, oral membranes moist Neck: supple Cardiovascular: RRR without murmur. No JVD    Respiratory/Chest: CTA Bilaterally without wheezes or rales. Normal effort    GI/Abdomen: BS +, non-tender, non-distended Ext: no clubbing, cyanosis, or edema Psych: pleasant and cooperative  Skin: No evidence of breakdown, no evidence of rash. Abdominal incision healing Neurologic: Cranial nerves II through XII intact, motor strength is 4/5 in bilateral deltoid, bicep, tricep, grip, hip flexor, knee extensors, ankle dorsiflexor and plantar flexor   Musculoskeletal: Full range of motion in all 4 extremities. No joint swelling   Assessment/Plan: 1. Functional deficits which require 3+ hours per day of interdisciplinary therapy in a comprehensive inpatient rehab setting. Physiatrist is providing close team supervision and 24 hour management of active medical problems listed  below. Physiatrist and rehab team continue to assess barriers to discharge/monitor patient progress toward functional and medical goals  Care Tool:  Bathing    Body parts bathed by patient: Right arm, Left arm, Chest, Abdomen, Front perineal area, Buttocks, Face, Left lower leg, Right lower leg, Left upper leg, Right upper leg         Bathing assist Assist Level: Set up assist     Upper Body Dressing/Undressing Upper body dressing   What is the patient wearing?: Pull over shirt    Upper body assist Assist Level: Independent    Lower Body Dressing/Undressing Lower body dressing      What is the patient wearing?: Pants     Lower body assist Assist for lower body dressing: Supervision/Verbal cueing     Toileting Toileting    Toileting assist Assist for toileting: Supervision/Verbal cueing     Transfers Chair/bed transfer  Transfers assist     Chair/bed transfer assist level: Supervision/Verbal cueing     Locomotion Ambulation   Ambulation assist      Assist level: Supervision/Verbal cueing Assistive device: Rollator Max distance: 150   Walk 10 feet activity   Assist     Assist level: Supervision/Verbal cueing Assistive device: Rollator   Walk 50 feet activity   Assist  Assist level: Supervision/Verbal cueing Assistive device: Rollator    Walk 150 feet activity   Assist    Assist level: Supervision/Verbal cueing Assistive device: Rollator    Walk 10 feet on uneven surface  activity   Assist     Assist level: Contact Guard/Touching assist     Wheelchair     Assist Is the patient using a wheelchair?: Yes Type of Wheelchair: Manual    Wheelchair assist level: Dependent - Patient 0%      Wheelchair 50 feet with 2 turns activity    Assist        Assist Level: Dependent - Patient 0%   Wheelchair 150 feet activity     Assist      Assist Level: Dependent - Patient 0%   Blood pressure 105/66, pulse  92, temperature 98.4 F (36.9 C), temperature source Oral, resp. rate 17, height 5' 10 (1.778 m), weight 63.6 kg, SpO2 99%.  Medical Problem List and Plan: 1. Functional deficits secondary to debility after septic shock, ischemic bowel and associated medical and surgical course             -patient may shower             -ELOS/Goals:1/13 mod I to supervision with PT, OT, SLP  --Continue CIR therapies including PT, OT, and SLP   2.  Antithrombotics: -DVT/anticoagulation:  Mechanical: Sequential compression devices, below knee Bilateral lower extremities Pharmaceutical: Coumadin              -antiplatelet therapy: N/A   3. Pain Management: Lidoderm  patch.  As needed: Tylenol  and oxycodone .    1/11 myofasical shoulder girdle pain   -TPI's 1/9 with benefit   -discussed appropriate posture, stretches, etc 4. Mood/Behavior/Sleep: LCSW to follow for evaluation and support when available.              - Agitation:  Seroquel  25 mg nightly--improved--probably can wean off soon   5. Neuropsych/cognition: This patient is capable of making decisions on his own behalf.   6. Skin/Wound Care: Routine pressure relief measures.  WOC consulted for ongoing management of ostomy and abdominal wound.  Monitor sites for infection.   1/11 sites closing, clean   7. Fluids/Electrolytes/Nutrition: Monitor strict I&O and daily weights.  Cortrak d/ed -Continue PPI   Dietary consult appreciated  8. Acute biventricular HFrEF with cardiogenic shock status post:  -warfarin for 3 months, heparin  bridge. Pharmacy to dose both.-Anticoagulation held because of blood in ostomy output. INR 1.1 -GDMT - dig, spiro, losartan .  -con't amiodarone  200mg  BID              -con't colchine for pericardial effusion  Appreciate cards f/u    9. Acute respiratory failure with hypoxia and hypercapnia status post tracheostomy:  -Trach capped x 3 days. Decannulated  -stoma healing.  -con't Yupelri  and Brovan nebs. Encourage insp  spiro.    10.Sepsis with septic shock due to recurrent aspiration pneumonia -Enterobacter:              -complete 7 days of ceftriaxone ; last day 12/29/24    11. 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 Anticipate he would benefit from Creon for short gut once off TF.  -may require vitamin B12 injections.  Monitor fat-soluble  vitamin levels while here.--labs pending for Monday    12. Anemia of critical illness and previous ABLA from hemothorax: Monitor CBC, transfuse Hb<7       Latest Ref Rng & Units 12/31/2024    5:22 AM 12/29/2024    5:38 AM 12/28/2024    1:19 AM  CBC  WBC 4.0 - 10.5 K/uL 12.8  11.2  11.8   Hemoglobin 13.0 - 17.0 g/dL 8.9  8.2  8.3   Hematocrit 39.0 - 52.0 % 26.7  24.5  25.4   Platelets 150 - 400 K/uL 413  421  424    Stable 1/8 13. Hyponatremia: Na 130         Latest Ref Rng & Units 01/01/2025    5:51 AM 12/31/2024    5:22 AM 12/30/2024    4:35 AM  BMP  Glucose 70 - 99 mg/dL 885  97  861   BUN 6 - 20 mg/dL 14  22  26    Creatinine 0.61 - 1.24 mg/dL 9.37  9.34  9.27   Sodium 135 - 145 mmol/L 130  126  127   Potassium 3.5 - 5.1 mmol/L 4.3  4.8  5.1   Chloride 98 - 111 mmol/L 98  94  94   CO2 22 - 32 mmol/L 21  21  21    Calcium  8.9 - 10.3 mg/dL 9.0  9.8  9.3   Hyper K+ resolved and  hypo Na+ improving but with normal renal function  Appreciate nephro assist  14.  Persistent leukocytosis without fever, has pericardial effusion without tamponade now on colchicine  will see if cards feels this may be source   1/11 CT surgery saw 1/9. No changes in plan    LOS: 6 days A FACE TO FACE EVALUATION WAS PERFORMED  Arthea ONEIDA Gunther 01/03/2025, 8:07 AM     "

## 2025-01-04 ENCOUNTER — Telehealth (HOSPITAL_COMMUNITY): Payer: Self-pay

## 2025-01-04 ENCOUNTER — Other Ambulatory Visit (HOSPITAL_COMMUNITY): Payer: Self-pay

## 2025-01-04 LAB — CBC
HCT: 25 % — ABNORMAL LOW (ref 39.0–52.0)
Hemoglobin: 8.2 g/dL — ABNORMAL LOW (ref 13.0–17.0)
MCH: 29.5 pg (ref 26.0–34.0)
MCHC: 32.8 g/dL (ref 30.0–36.0)
MCV: 89.9 fL (ref 80.0–100.0)
Platelets: 442 K/uL — ABNORMAL HIGH (ref 150–400)
RBC: 2.78 MIL/uL — ABNORMAL LOW (ref 4.22–5.81)
RDW: 17.5 % — ABNORMAL HIGH (ref 11.5–15.5)
WBC: 10.3 K/uL (ref 4.0–10.5)
nRBC: 0 % (ref 0.0–0.2)

## 2025-01-04 LAB — BASIC METABOLIC PANEL WITH GFR
Anion gap: 11 (ref 5–15)
BUN: 9 mg/dL (ref 6–20)
CO2: 23 mmol/L (ref 22–32)
Calcium: 9.2 mg/dL (ref 8.9–10.3)
Chloride: 96 mmol/L — ABNORMAL LOW (ref 98–111)
Creatinine, Ser: 0.64 mg/dL (ref 0.61–1.24)
GFR, Estimated: >60 mL/min
Glucose, Bld: 90 mg/dL (ref 70–99)
Potassium: 3.9 mmol/L (ref 3.5–5.1)
Sodium: 130 mmol/L — ABNORMAL LOW (ref 135–145)

## 2025-01-04 LAB — PROTIME-INR
INR: 2.3 — ABNORMAL HIGH (ref 0.8–1.2)
Prothrombin Time: 26.3 s — ABNORMAL HIGH (ref 11.4–15.2)

## 2025-01-04 MED ORDER — UMECLIDINIUM-VILANTEROL 62.5-25 MCG/ACT IN AEPB: 1.0000 | INHALATION_SPRAY | Freq: Every day | RESPIRATORY_TRACT | Status: DC

## 2025-01-04 MED ORDER — SPIRONOLACTONE 12.5 MG HALF TABLET
12.5000 mg | ORAL_TABLET | Freq: Every day | ORAL | Status: DC
  Administered 2025-01-04 – 2025-01-05 (×2): 12.5 mg via ORAL
  Filled 2025-01-04 (×2): qty 1

## 2025-01-04 MED ORDER — WARFARIN SODIUM 5 MG PO TABS
5.0000 mg | ORAL_TABLET | Freq: Every day | ORAL | 0 refills | Status: AC
  Filled 2025-01-04: qty 30, 30d supply, fill #0

## 2025-01-04 MED ORDER — SIMETHICONE 80 MG PO CHEW: 80.0000 mg | CHEWABLE_TABLET | Freq: Four times a day (QID) | ORAL | Status: DC | PRN

## 2025-01-04 MED ORDER — COLCHICINE 0.6 MG PO TABS
0.3000 mg | ORAL_TABLET | Freq: Every day | ORAL | 0 refills | Status: DC
  Filled 2025-01-04: qty 15, 30d supply, fill #0

## 2025-01-04 MED ORDER — UMECLIDINIUM BROMIDE 62.5 MCG/ACT IN AEPB
1.0000 | INHALATION_SPRAY | Freq: Every day | RESPIRATORY_TRACT | 0 refills | Status: DC
  Filled 2025-01-04: qty 30, 30d supply, fill #0

## 2025-01-04 MED ORDER — FLUTICASONE FUROATE-VILANTEROL 100-25 MCG/ACT IN AEPB
1.0000 | INHALATION_SPRAY | Freq: Every day | RESPIRATORY_TRACT | 0 refills | Status: DC
  Filled 2025-01-04: qty 60, 60d supply, fill #0

## 2025-01-04 MED ORDER — FLUTICASONE FUROATE-VILANTEROL 100-25 MCG/ACT IN AEPB
1.0000 | INHALATION_SPRAY | Freq: Two times a day (BID) | RESPIRATORY_TRACT | 0 refills | Status: DC
  Filled 2025-01-04: qty 60, 30d supply, fill #0

## 2025-01-04 MED ORDER — THIAMINE HCL 100 MG PO TABS
100.0000 mg | ORAL_TABLET | Freq: Every day | ORAL | 0 refills | Status: AC
  Filled 2025-01-04: qty 100, 100d supply, fill #0

## 2025-01-04 MED ORDER — PANTOPRAZOLE SODIUM 40 MG PO TBEC
40.0000 mg | DELAYED_RELEASE_TABLET | Freq: Every day | ORAL | 0 refills | Status: DC
  Filled 2025-01-04: qty 30, 30d supply, fill #0

## 2025-01-04 MED ORDER — QUETIAPINE FUMARATE 25 MG PO TABS
25.0000 mg | ORAL_TABLET | Freq: Every day | ORAL | 0 refills | Status: DC
  Filled 2025-01-04: qty 30, 30d supply, fill #0

## 2025-01-04 MED ORDER — SIMETHICONE 80 MG PO CHEW
80.0000 mg | CHEWABLE_TABLET | Freq: Four times a day (QID) | ORAL | 0 refills | Status: DC | PRN
  Filled 2025-01-04: qty 30, 8d supply, fill #0

## 2025-01-04 MED ORDER — OXYCODONE HCL 5 MG PO TABS
5.0000 mg | ORAL_TABLET | Freq: Four times a day (QID) | ORAL | 0 refills | Status: DC | PRN
  Filled 2025-01-04: qty 20, 5d supply, fill #0

## 2025-01-04 MED ORDER — FOLIC ACID 1 MG PO TABS
1.0000 mg | ORAL_TABLET | Freq: Every day | ORAL | 0 refills | Status: AC
  Filled 2025-01-04: qty 100, 100d supply, fill #0

## 2025-01-04 MED ORDER — LOPERAMIDE HCL 2 MG PO CAPS
4.0000 mg | ORAL_CAPSULE | Freq: Four times a day (QID) | ORAL | 0 refills | Status: AC
  Filled 2025-01-04: qty 240, 30d supply, fill #0

## 2025-01-04 MED ORDER — EMPAGLIFLOZIN 10 MG PO TABS
10.0000 mg | ORAL_TABLET | Freq: Every day | ORAL | 0 refills | Status: DC
  Filled 2025-01-04: qty 30, 30d supply, fill #0

## 2025-01-04 MED ORDER — OXYCODONE HCL 5 MG PO TABS
5.0000 mg | ORAL_TABLET | ORAL | Status: DC | PRN
  Administered 2025-01-04: 5 mg via ORAL
  Filled 2025-01-04: qty 1

## 2025-01-04 MED ORDER — DIPHENOXYLATE-ATROPINE 2.5-0.025 MG PO TABS
1.0000 | ORAL_TABLET | Freq: Four times a day (QID) | ORAL | 0 refills | Status: DC
  Filled 2025-01-04: qty 10, 3d supply, fill #0
  Filled 2025-01-08: qty 110, 28d supply, fill #0

## 2025-01-04 MED ORDER — SCOPOLAMINE 1 MG/3DAYS TD PT72
1.0000 | MEDICATED_PATCH | TRANSDERMAL | 12 refills | Status: DC
  Filled 2025-01-04: qty 10, 30d supply, fill #0

## 2025-01-04 MED ORDER — LIDOCAINE 5 % EX PTCH: 1.0000 | MEDICATED_PATCH | CUTANEOUS | Status: DC

## 2025-01-04 MED ORDER — ALBUTEROL SULFATE HFA 108 (90 BASE) MCG/ACT IN AERS
2.0000 | INHALATION_SPRAY | RESPIRATORY_TRACT | 0 refills | Status: DC | PRN
  Filled 2025-01-04: qty 18, 17d supply, fill #0

## 2025-01-04 MED ORDER — CALCIUM POLYCARBOPHIL 625 MG PO TABS
625.0000 mg | ORAL_TABLET | Freq: Every day | ORAL | 0 refills | Status: DC
  Filled 2025-01-04: qty 30, 30d supply, fill #0

## 2025-01-04 MED ORDER — METOPROLOL SUCCINATE ER 25 MG PO TB24
12.5000 mg | ORAL_TABLET | Freq: Every day | ORAL | 0 refills | Status: DC
  Filled 2025-01-04: qty 15, 30d supply, fill #0

## 2025-01-04 MED ORDER — FLUTICASONE FUROATE-VILANTEROL 100-25 MCG/ACT IN AEPB
1.0000 | INHALATION_SPRAY | Freq: Every day | RESPIRATORY_TRACT | Status: DC
  Administered 2025-01-04 – 2025-01-05 (×2): 1 via RESPIRATORY_TRACT
  Filled 2025-01-04: qty 28

## 2025-01-04 MED ORDER — WARFARIN SODIUM 5 MG PO TABS
5.0000 mg | ORAL_TABLET | Freq: Every day | ORAL | Status: DC
  Administered 2025-01-04: 5 mg via ORAL
  Filled 2025-01-04: qty 1

## 2025-01-04 MED ORDER — ALBUTEROL SULFATE HFA 108 (90 BASE) MCG/ACT IN AERS: 2.0000 | INHALATION_SPRAY | RESPIRATORY_TRACT | Status: DC | PRN

## 2025-01-04 MED ORDER — ADULT MULTIVITAMIN W/MINERALS CH
1.0000 | ORAL_TABLET | Freq: Every day | ORAL | 0 refills | Status: DC
  Filled 2025-01-04: qty 100, 100d supply, fill #0

## 2025-01-04 MED ORDER — SPIRONOLACTONE 25 MG PO TABS
12.5000 mg | ORAL_TABLET | Freq: Every day | ORAL | 0 refills | Status: DC
  Filled 2025-01-04: qty 15, 30d supply, fill #0

## 2025-01-04 MED ORDER — UMECLIDINIUM BROMIDE 62.5 MCG/ACT IN AEPB
1.0000 | INHALATION_SPRAY | Freq: Every day | RESPIRATORY_TRACT | Status: DC
  Administered 2025-01-04 – 2025-01-05 (×2): 1 via RESPIRATORY_TRACT
  Filled 2025-01-04: qty 7

## 2025-01-04 MED ORDER — LIDOCAINE 5 % EX PTCH
1.0000 | MEDICATED_PATCH | CUTANEOUS | 0 refills | Status: DC
  Filled 2025-01-04: qty 30, 30d supply, fill #0

## 2025-01-04 MED ORDER — AMIODARONE HCL 200 MG PO TABS
200.0000 mg | ORAL_TABLET | Freq: Two times a day (BID) | ORAL | 0 refills | Status: DC
  Filled 2025-01-04: qty 60, 30d supply, fill #0

## 2025-01-04 MED ORDER — FLUTICASONE FUROATE-VILANTEROL 100-25 MCG/ACT IN AEPB
1.0000 | INHALATION_SPRAY | Freq: Two times a day (BID) | RESPIRATORY_TRACT | Status: DC
  Filled 2025-01-04: qty 28

## 2025-01-04 NOTE — Plan of Care (Signed)
  Problem: RH Balance Goal: LTG Patient will maintain dynamic standing with ADLs (OT) Description: LTG:  Patient will maintain dynamic standing balance with assist during activities of daily living (OT)  Outcome: Completed/Met   Problem: RH Eating Goal: LTG Patient will perform eating w/assist, cues/equip (OT) Description: LTG: Patient will perform eating with assist, with/without cues using equipment (OT) Outcome: Completed/Met   Problem: RH Grooming Goal: LTG Patient will perform grooming w/assist,cues/equip (OT) Description: LTG: Patient will perform grooming with assist, with/without cues using equipment (OT) Outcome: Completed/Met   Problem: RH Bathing Goal: LTG Patient will bathe all body parts with assist levels (OT) Description: LTG: Patient will bathe all body parts with assist levels (OT) Outcome: Completed/Met   Problem: RH Dressing Goal: LTG Patient will perform upper body dressing (OT) Description: LTG Patient will perform upper body dressing with assist, with/without cues (OT). Outcome: Completed/Met Goal: LTG Patient will perform lower body dressing w/assist (OT) Description: LTG: Patient will perform lower body dressing with assist, with/without cues in positioning using equipment (OT) Outcome: Completed/Met   Problem: RH Toileting Goal: LTG Patient will perform toileting task (3/3 steps) with assistance level (OT) Description: LTG: Patient will perform toileting task (3/3 steps) with assistance level (OT)  Outcome: Completed/Met   Problem: RH Toilet Transfers Goal: LTG Patient will perform toilet transfers w/assist (OT) Description: LTG: Patient will perform toilet transfers with assist, with/without cues using equipment (OT) Outcome: Completed/Met   Problem: RH Tub/Shower Transfers Goal: LTG Patient will perform tub/shower transfers w/assist (OT) Description: LTG: Patient will perform tub/shower transfers with assist, with/without cues using equipment  (OT) Outcome: Completed/Met

## 2025-01-04 NOTE — Progress Notes (Signed)
 ANTICOAGULATION CONSULT NOTE  Pharmacy Consult for warfarin Indication: bA/MVR, new AFL  Allergies[1]  Patient Measurements: Height: 5' 10 (177.8 cm) Weight: 63 kg (138 lb 14.2 oz) IBW/kg (Calculated) : 73 Heparin  Dosing Weight: 70 kg   Vital Signs: Temp: 97.5 F (36.4 C) (01/12 0652) BP: 106/73 (01/12 0652) Pulse Rate: 89 (01/12 0652)  Labs: Recent Labs    01/02/25 0604 01/03/25 0605 01/04/25 0623  HGB  --   --  8.2*  HCT  --   --  25.0*  PLT  --   --  442*  LABPROT 23.2* 24.8* 26.3*  INR 2.0* 2.1* 2.3*  CREATININE  --   --  0.64    Estimated Creatinine Clearance: 103.9 mL/min (by C-G formula based on SCr of 0.64 mg/dL).  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  Postop course complicated by ischemic bowel s/p exlap 12/11 with partial small bowel resection, abdomen left open.  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 Impella 5.5 removed 12/24.  Was on TPN 12/12>>12/29.   Per Dr. Lucas, plan for 3 months of OAC with dual valve replacement. Not on anticoagulation prior to admission.  Pharmacy consulted for heparin  and warfarin dosing. Heparin  infusion paused 1/3 AM after BRBPR; another BRBM 1/4 PM per RN.  No plans to restart heparin  infusion per Dr. Lucas - plan to let INR trend up.   (warfarin started 12/30). Oral intake variable - higher yesterday than today.  Notable DDIs - TPN off since 12/29, TF stopped 1/7 AM (changed to nocturnal feeds 1/6). Dietary supplements added 1/6 - none charted as given yet.  PO amiodarone  BID  INR 2.3 at goal  Was requiring higher doses of warfarin while on tube feeds - with vitamin K Will decrease dose now that patient eating diet  Goal of Therapy:  INR 2-3 Monitor platelets by anticoagulation protocol: Yes   Plan:  Warfarin 5mg  daily  Daily INR, CBC, and s/sx of bleeding F/u  PO intake Sent message to [facility] for post hospital INR check    Olam Chalk Pharm.D. CPP, BCPS Clinical Pharmacist [phone] 01/04/2025 12:33 PM    **Pharmacist phone directory can now be found on [email or web address] ([email or web address]).  Listed under [facility].           [1]  Allergies Allergen Reactions   Porcine (Pork) Protein-Containing Drug Products Other (See Comments)

## 2025-01-04 NOTE — Discharge Instructions (Addendum)
 Inpatient Rehab Discharge Instructions  Alejandro Lopez [facility]  Discharge date and time: 01/05/2025  Activities/Precautions/ Functional Status:  Activity: activity as tolerated and no lifting, driving, or strenuous exercise for until cleared by provider.   Diet: regular diet  Wound Care: keep wound clean and dry, as directed, reinforce dressing PRN, and refer to ostomy care instructions.    Functional status:  ___ No restrictions     ___ Walk up steps independently ___ 24/7 supervision/assistance   ___ Walk up steps with assistance _X__ Intermittent supervision/assistance  ___ Bathe/dress independently ___ Walk with walker     ___ Bathe/dress with assistance ___ Walk Independently    ___ Shower independently ___ Walk with assistance    ___ Shower with assistance __X_ No alcohol     ___ Return to work/school ________  Special Instructions:  *Get a pill box from pharmacy*  - Follow-up with ostomy clinic for any questions.  A referral has been placed to schedule appointments.   -The Coumadin  clinic will be managing your dosage and adjustments.  You will be contacted to schedule follow-up appointment.  -Cardiac Rehab to be determined at follow up appointment with cardiology.    My questions have been answered and I understand these instructions. I will adhere to these goals and the provided educational materials after my discharge from the hospital.  Patient/Caregiver Signature _______________________________ Date __________  Clinician Signature _______________________________________ Date __________  Please bring this form and your medication list with you to all your follow-up doctor's appointments.      COMMUNITY REFERRALS UPON DISCHARGE:    Outpatient: PT             Agency: [facility]    Phone: [phone]             Appointment Date/Time: HEART FAILURE/CARDIAC MD will refer at follow up appointment.   Medical Equipment/Items Ordered: ROLLATOR  AND TUB SEAT                                                 Agency/Supplier:[facility]   [phone]    STROKE/TIA DISCHARGE INSTRUCTIONS SMOKING Cigarette smoking nearly doubles your risk of having a stroke & is the single most alterable risk factor  If you smoke or have smoked in the last 12 months, you are advised to quit smoking for your health. Most of the excess cardiovascular risk related to smoking disappears within a year of stopping. Ask you doctor about anti-smoking medications [place] Quit Line: 1-800-QUIT NOW Free Smoking Cessation Classes [phone]  CHOLESTEROL Know your levels; limit fat & cholesterol in your diet  Lipid Panel     Component Value Date/Time   CHOL 149 11/27/2024 0205   TRIG 116 12/21/2024 0436   HDL 40 (L) 11/27/2024 0205   CHOLHDL 3.7 11/27/2024 0205   VLDL 16 11/27/2024 0205   LDLCALC 93 11/27/2024 0205     Many patients benefit from treatment even if their cholesterol is at goal. Goal: Total Cholesterol (CHOL) less than 160 Goal:  Triglycerides (TRIG) less than 150 Goal:  HDL greater than 40 Goal:  LDL (LDLCALC) less than 100   BLOOD PRESSURE American Stroke Association blood pressure target is less that 120/80 mm/Hg  Your discharge blood pressure is:  BP: 106/73 Monitor your blood pressure Limit your salt and alcohol intake Many individuals will  require more than one medication for high blood pressure  DIABETES (A1c is a blood sugar average for last 3 months) Goal HGBA1c is under 7% (HBGA1c is blood sugar average for last 3 months)  Diabetes:   Lab Results  Component Value Date   HGBA1C 6.1 (H) 11/27/2024    Your HGBA1c can be lowered with medications, healthy diet, and exercise. Check your blood sugar as directed by your physician Call your physician if you experience unexplained or low blood sugars.  PHYSICAL ACTIVITY/REHABILITATION Goal is 30 minutes at least 4 days per week  Activity: Increase activity slowly, and No  driving, Therapies: Physical Therapy: Outpatient Return to work: Not until cleared.  Activity decreases your risk of heart attack and stroke and makes your heart stronger.  It helps control your weight and blood pressure; helps you relax and can improve your mood. Participate in a regular exercise program. Talk with your doctor about the best form of exercise for you (dancing, walking, swimming, cycling).  DIET/WEIGHT Goal is to maintain a healthy weight  Your discharge diet is:  Diet Order             Diet regular Room service appropriate? Yes with Assist; Fluid consistency: Thin  Diet effective now                  Your height is:  Height: 5' 10 (177.8 cm) Your current weight is: Weight: 63 kg Your Body Mass Index (BMI) is:  BMI (Calculated): 19.93 Following the type of diet specifically designed for you will help prevent another stroke.  Your goal Body Mass Index (BMI) is 19-24. Healthy food habits can help reduce 3 risk factors for stroke:  High cholesterol, hypertension, and excess weight.  RESOURCES Stroke/Support Group:  Call [phone]   STROKE EDUCATION PROVIDED/REVIEWED AND GIVEN TO PATIENT Stroke warning signs and symptoms How to activate emergency medical system (call 911). Medications prescribed at discharge. Need for follow-up after discharge. Personal risk factors for stroke. Pneumonia vaccine given: No Flu vaccine given: No My questions have been answered, the writing is legible, and I understand these instructions.  I will adhere to these goals & educational materials that have been provided to me after my discharge from the hospital.    ============================================ Information on my medicine - Coumadin    (Warfarin)  This medication education was reviewed with me or my healthcare representative as part of my discharge preparation.  The pharmacist that spoke with me during my hospital stay was:  Maurilio RAYMOND Fila, RPH  Why was Coumadin   prescribed for you? Coumadin  was prescribed for you because you have a blood clot or a medical condition that can cause an increased risk of forming blood clots. Blood clots can cause serious health problems by blocking the flow of blood to the heart, lung, or brain. Coumadin  can prevent harmful blood clots from forming. As a reminder your indication for Coumadin  is:  double bioprosthetic valves  What test will check on my response to Coumadin ? While on Coumadin  (warfarin) you will need to have an INR test regularly to ensure that your dose is keeping you in the desired range. The INR (international normalized ratio) number is calculated from the result of the laboratory test called prothrombin time (PT).  If an INR APPOINTMENT HAS NOT ALREADY BEEN MADE FOR YOU please schedule an appointment to have this lab work done by your health care provider within 7 days. Your INR goal is usually a number between:  2  to 3 or your provider may give you a more narrow range like 2-2.5.  Ask your health care provider during an office visit what your goal INR is.  What  do you need to  know  About  COUMADIN ? Take Coumadin  (warfarin) exactly as prescribed by your healthcare provider about the same time each day.  DO NOT stop taking without talking to the doctor who prescribed the medication.  Stopping without other blood clot prevention medication to take the place of Coumadin  may increase your risk of developing a new clot or stroke.  Get refills before you run out.  What do you do if you miss a dose? If you miss a dose, take it as soon as you remember on the same day then continue your regularly scheduled regimen the next day.  Do not take two doses of Coumadin  at the same time.  Important Safety Information A possible side effect of Coumadin  (Warfarin) is an increased risk of bleeding. You should call your healthcare provider right away if you experience any of the following: Bleeding from an injury or your nose  that does not stop. Unusual colored urine (red or dark brown) or unusual colored stools (red or black). Unusual bruising for unknown reasons. A serious fall or if you hit your head (even if there is no bleeding).  Some foods or medicines interact with Coumadin  (warfarin) and might alter your response to warfarin. To help avoid this: Eat a balanced diet, maintaining a consistent amount of Vitamin K. Notify your provider about major diet changes you plan to make. Avoid alcohol or limit your intake to 1 drink for women and 2 drinks for men per day. (1 drink is 5 oz. wine, 12 oz. beer, or 1.5 oz. liquor.)  Make sure that ANY health care provider who prescribes medication for you knows that you are taking Coumadin  (warfarin).  Also make sure the healthcare provider who is monitoring your Coumadin  knows when you have started a new medication including herbals and non-prescription products.  Coumadin  (Warfarin)  Major Drug Interactions  Increased Warfarin Effect Decreased Warfarin Effect  Alcohol (large quantities) Antibiotics (esp. Septra /Bactrim , Flagyl, Cipro) Amiodarone  (Cordarone ) Aspirin  (ASA) Cimetidine (Tagamet) Megestrol (Megace) NSAIDs (ibuprofen , naproxen , etc.) Piroxicam (Feldene) Propafenone (Rythmol SR) Propranolol (Inderal) Isoniazid (INH) Posaconazole (Noxafil) Barbiturates (Phenobarbital ) Carbamazepine (Tegretol) Chlordiazepoxide (Librium) Cholestyramine (Questran) Griseofulvin Oral Contraceptives Rifampin Sucralfate (Carafate) Vitamin K   Coumadin  (Warfarin) Major Herbal Interactions  Increased Warfarin Effect Decreased Warfarin Effect  Garlic Ginseng Ginkgo biloba Coenzyme Q10 Green tea St. Johns wort    Coumadin  (Warfarin) FOOD Interactions  Eat a consistent number of servings per week of foods HIGH in Vitamin K (1 serving =  cup)  Collards (cooked, or boiled & drained) Kale (cooked, or boiled & drained) Mustard greens (cooked, or boiled &  drained) Parsley *serving size only =  cup Spinach (cooked, or boiled & drained) Swiss chard (cooked, or boiled & drained) Turnip greens (cooked, or boiled & drained)  Eat a consistent number of servings per week of foods MEDIUM-HIGH in Vitamin K (1 serving = 1 cup)  Asparagus (cooked, or boiled & drained) Broccoli (cooked, boiled & drained, or raw & chopped) Brussel sprouts (cooked, or boiled & drained) *serving size only =  cup Lettuce, raw (green leaf, endive, romaine) Spinach, raw Turnip greens, raw & chopped   These websites have more information on Coumadin  (warfarin):  www.coumadin .com; www.ahrq.gov/consumer/coumadin .htm;

## 2025-01-04 NOTE — Consult Note (Addendum)
 WOC Nurse ostomy follow up Stoma type/location: ileostomy RLQ.   Stomal assessment/size: budded stoma red and viable, os in center, 2 cm above the skin level. Peristomal assessment: intact, stitches present. Shallow wound in the left. Treatment options for stomal/peristomal skin: ring [id number]. Output 100 ml in bag at 1000. Dark yellow liquid with clumps. Ostomy pouching: 1pc. #[id number]. Education provided:  Explained stoma characteristics (budded, flush, color, texture, care) Demonstrated pouch change (cutting new skin barrier, measuring stoma, cleaning peristomal skin and stoma, use of barrier ring) Education on emptying when 1/3 to 1/2 full and how to empty Demonstrated use of wick to clean spout  Discussed bathing, diet, gas. Discussed treatment of peristomal skin (ostomy powder, skin barrier wipes) Provided patient with [employer] and marked items currently using Answered patient/family questions:   Patient and family member participated in  ostomy care, reviewed all the steps of how to change the bag, necessary supplies, and when change it.  The patient and his wife were engaged in the process, feeling more confident in how to care for a stoma. Checked the supplies for home, pt has 5 bags and 5 rings (ordered after consultation)  Enrolled patient in [employer] Discharge program: Yes, previous. Plans for dc tomorrow.  WOC Nurse wound follow up Wound type: surgical Measurement: 8 cm x 3 cm x 0.1 cm. Wound bed: 100% pink Drainage (amount, consistency, odor) scant amount, serous, no odor. Periwound: intact. Dressing procedure/placement/frequency: Cleanse with Vashe# [id number], not rinse and allow to dry. Cover with soak gauze with Vashe, top with dry gauze r ABD pad and tape. Change twice a day.   WOC team will not plan to follow further. Please reconsult if further assistance is needed. Thank-you,  Lela Holm MSN, RN, CWCN, CNS.  (Phone [phone])

## 2025-01-04 NOTE — Progress Notes (Addendum)
 Occupational Therapy Session Note  Patient Details  Name: Alejandro Lopez MRN: [id number] Date of Birth: [birth year]  Today's Date: 01/04/2025 OT Individual Time: 1100-1200 OT Individual Time Calculation (min): 60 min    Short Term Goals: Week 1:  OT Short Term Goal 1 (Week 1): STG=LTG d/t ELOS     Skilled Therapeutic Interventions/Progress Updates:    Pt received in room with wife.  Discussed his progress and asked pt /wife if they had any other questions/ concerns.  The other questions they had not related to OT, I relayed to the SW.  They stated they are feeling prepared to go home tomorrow.  Reviewed that he is now mod ind with self care and mobility with 4WW. Reviewed basic arm exercises with band for biceps and triceps. Due to sternal precautions pt is limited with what he can do with UEs.  Ambulated well over 1000 ft today with 4 WW - walking from room to opposite wing to day room gym to use nustep.  HR 100 post walking and while he was doing the nustep ex for 8 min at resistance 3.  He felt a higher resistance was too difficult.  Ambulated back to room and stopped at tub room to practice stepping over tub wall. Pt able to do so safely and independently.  Offered to pt to allow him to be mod ind in the room, but he deferred I am going home tomorrow anyway and feel better if someone is around when I am walking in my room.  Therapy Documentation Precautions:  Precautions Precautions: Fall, Sternal Precaution/Restrictions Comments: Feeding tube, wound vac, ostomy Restrictions Weight Bearing Restrictions Per Provider Order: Yes RUE Weight Bearing Per Provider Order: Non weight bearing LUE Weight Bearing Per Provider Order: Non weight bearing Other Position/Activity Restrictions: sternal precautions   Pain: Pain Assessment Pain Scale: 0-10 Pain Score: 0-No pain       Therapy/Group: Individual Therapy  [name] 01/04/2025, 12:31 PM

## 2025-01-04 NOTE — Progress Notes (Signed)
 Physical Therapy Discharge Summary  Patient Details  Name: Alejandro Lopez MRN: [id number] Date of Birth: 1981-12-10  Date of Discharge from PT service:January 04, 2025  Today's Date: 01/04/2025 PT Individual Time: 8577-8481 PT Individual Time Calculation (min): 56 min    Patient has met {NUMBERS 0-12:18577} of {NUMBERS 0-12:18577} long term goals due to {due un:6958322}.  Patient to discharge at [facility] level {LOA:3049010}.   Patient's care partner {care partner:[id number]} to provide the necessary {assistance:3041652} assistance at discharge.  Reasons goals not met: ***  Recommendation:  Patient will benefit from ongoing skilled PT services in outpatient setting for cardiac rehab to continue to advance safe functional mobility, address ongoing impairments in strength, coordination, balance, activity tolerance, cognition, safety awareness, and to minimize fall risk.  Equipment: rollator  Reasons for discharge: treatment goals met and discharge from hospital  Patient/family agrees with progress made and goals achieved: Yes  PT Discharge Precautions/Restrictions Precautions Precautions: Fall;Sternal Precaution/Restrictions Comments: ostomy, abdominal wound Restrictions Other Position/Activity Restrictions: sternal precautions Vital Signs   Pain Pain Assessment Pain Scale: 0-10 Pain Score: 0-No pain Pain Interference   Vision/Perception  Vision - History Ability to See in Adequate Light: 0 Adequate Perception Perception: Within Functional Limits Praxis Praxis: WFL  Cognition Overall Cognitive Status: Within Functional Limits for tasks assessed Orientation Level: Oriented X4 Safety/Judgment: Appears intact Sensation Sensation Light Touch: Appears Intact Hot/Cold: Appears Intact Proprioception: Appears Intact Stereognosis: Appears Intact Coordination Gross Motor Movements are Fluid and Coordinated: Yes Fine Motor Movements are Fluid and Coordinated:  Yes Motor  Motor Motor - Discharge Observations: generalized weakness  Mobility   Locomotion     Trunk/Postural Assessment  Postural Control Postural Control: Within Functional Limits  Balance   Extremity Assessment            [name] PT, DPT, CSRS 01/04/2025, 12:47 PM

## 2025-01-04 NOTE — Progress Notes (Signed)
 "                                                        PROGRESS NOTE   Subjective/Complaints:  Labs reviewed Pharm note appreciated, now on stable dose of warfarin  ROS: Patient denies SOB, N/V/D Objective:   No results found.  Recent Labs    01/04/25 0623  WBC 10.3  HGB 8.2*  HCT 25.0*  PLT 442*    Recent Labs    01/04/25 0623  NA 130*  K 3.9  CL 96*  CO2 23  GLUCOSE 90  BUN 9  CREATININE 0.64  CALCIUM  9.2    Intake/Output Summary (Last 24 hours) at 01/04/2025 0804 Last data filed at 01/04/2025 0200 Gross per 24 hour  Intake 240 ml  Output 825 ml  Net -585 ml         Physical Exam: Vital Signs Blood pressure 106/73, pulse 89, temperature (!) 97.5 F (36.4 C), resp. rate 19, height 5' 10 (1.778 m), weight 63 kg, SpO2 100%.  Constitutional: No distress . Vital signs reviewed. HEENT: NCAT, EOMI, oral membranes moist Neck: supple Cardiovascular: RRR without murmur. No JVD    Respiratory/Chest: CTA Bilaterally without wheezes or rales. Normal effort    GI/Abdomen: BS +, non-tender, non-distended Ext: no clubbing, cyanosis, or edema Psych: pleasant and cooperative  Skin: No evidence of breakdown, no evidence of rash. Abdominal incision healing Neurologic: Cranial nerves II through XII intact, motor strength is 4/5 in bilateral deltoid, bicep, tricep, grip, hip flexor, knee extensors, ankle dorsiflexor and plantar flexor   Musculoskeletal: Full range of motion in all 4 extremities. No joint swelling   Assessment/Plan: 1. Functional deficits which require 3+ hours per day of interdisciplinary therapy in a comprehensive inpatient rehab setting. Physiatrist is providing close team supervision and 24 hour management of active medical problems listed below. Physiatrist and rehab team continue to assess barriers to discharge/monitor patient progress toward functional and medical goals  Care Tool:  Bathing    Body parts bathed by patient: Right arm, Left  arm, Chest, Abdomen, Front perineal area, Buttocks, Face, Left lower leg, Right lower leg, Left upper leg, Right upper leg         Bathing assist Assist Level: Set up assist     Upper Body Dressing/Undressing Upper body dressing   What is the patient wearing?: Pull over shirt    Upper body assist Assist Level: Independent    Lower Body Dressing/Undressing Lower body dressing      What is the patient wearing?: Pants     Lower body assist Assist for lower body dressing: Supervision/Verbal cueing     Toileting Toileting    Toileting assist Assist for toileting: Supervision/Verbal cueing     Transfers Chair/bed transfer  Transfers assist     Chair/bed transfer assist level: Supervision/Verbal cueing     Locomotion Ambulation   Ambulation assist      Assist level: Supervision/Verbal cueing Assistive device: Rollator Max distance: 150   Walk 10 feet activity   Assist     Assist level: Supervision/Verbal cueing Assistive device: Rollator   Walk 50 feet activity   Assist    Assist level: Supervision/Verbal cueing Assistive device: Rollator    Walk 150 feet activity   Assist    Assist level: Supervision/Verbal cueing Assistive  device: Rollator    Walk 10 feet on uneven surface  activity   Assist     Assist level: Contact Guard/Touching assist     Wheelchair     Assist Is the patient using a wheelchair?: Yes Type of Wheelchair: Manual    Wheelchair assist level: Dependent - Patient 0%      Wheelchair 50 feet with 2 turns activity    Assist        Assist Level: Dependent - Patient 0%   Wheelchair 150 feet activity     Assist      Assist Level: Dependent - Patient 0%   Blood pressure 106/73, pulse 89, temperature (!) 97.5 F (36.4 C), resp. rate 19, height 5' 10 (1.778 m), weight 63 kg, SpO2 100%.  Medical Problem List and Plan: 1. Functional deficits secondary to debility after septic shock, ischemic  bowel and associated medical and surgical course             -patient may shower             -ELOS/Goals:1/13 mod I to supervision with PT, OT, SLP  --Continue CIR therapies including PT, OT, and SLP   2.  Antithrombotics: -DVT/anticoagulation:  Mechanical: Sequential compression devices, below knee Bilateral lower extremities Pharmaceutical: Coumadin              -antiplatelet therapy: N/A   3. Pain Management: Lidoderm  patch.  As needed: Tylenol  and oxycodone .    1/11 myofasical shoulder girdle pain   -TPI's 1/9 with benefit COnt PT , will need HEP for this as well  4. Mood/Behavior/Sleep: LCSW to follow for evaluation and support when available.              - Agitation:  Seroquel  25 mg nightly--improved--probably can wean off soon   5. Neuropsych/cognition: This patient is capable of making decisions on his own behalf.   6. Skin/Wound Care: Routine pressure relief measures.  WOC consulted for ongoing management of ostomy and abdominal wound.  Monitor sites for infection.   1/11 sites closing, clean   7. Fluids/Electrolytes/Nutrition: Monitor strict I&O and daily weights.  Cortrak d/ed -Continue PPI   Dietary consult appreciated  8. Acute biventricular HFrEF with cardiogenic shock status post:  -warfarin for 3 months, heparin  bridge. Pharmacy to dose both.-Anticoagulation held because of blood in ostomy output. INR 1.1 -GDMT - dig, spiro, losartan .  -con't amiodarone  200mg  BID              -con't colchine for pericardial effusion  Appreciate cards f/u - has appt with Dr Zenaida 01/25/25   9. Acute respiratory failure with hypoxia and hypercapnia status post tracheostomy:  -Trach capped x 3 days. Decannulated  -stoma healing.  -con't Yupelri  and Brovan nebs. Encourage insp spiro.    10.Sepsis with septic shock due to recurrent aspiration pneumonia -Enterobacter:              -complete 7 days of ceftriaxone ; last day 12/29/24    11. 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 Anticipate he would benefit from Creon for short gut once off TF.  -may require vitamin B12 injections.  Monitor fat-soluble vitamin levels while here.--labs pending for Monday  F/u Dr Ebbie Gen Surg   12. Anemia  of critical illness and previous ABLA from hemothorax: Monitor CBC, transfuse Hb<7       Latest Ref Rng & Units 01/04/2025    6:23 AM 12/31/2024    5:22 AM 12/29/2024    5:38 AM  CBC  WBC 4.0 - 10.5 K/uL 10.3  12.8  11.2   Hemoglobin 13.0 - 17.0 g/dL 8.2  8.9  8.2   Hematocrit 39.0 - 52.0 % 25.0  26.7  24.5   Platelets 150 - 400 K/uL 442  413  421    Stable 1/12 13. Hyponatremia: Na 130         Latest Ref Rng & Units 01/04/2025    6:23 AM 01/01/2025    5:51 AM 12/31/2024    5:22 AM  BMP  Glucose 70 - 99 mg/dL 90  885  97   BUN 6 - 20 mg/dL 9  14  22    Creatinine 0.61 - 1.24 mg/dL 9.35  9.37  9.34   Sodium 135 - 145 mmol/L 130  130  126   Potassium 3.5 - 5.1 mmol/L 3.9  4.3  4.8   Chloride 98 - 111 mmol/L 96  98  94   CO2 22 - 32 mmol/L 23  21  21    Calcium  8.9 - 10.3 mg/dL 9.2  9.0  9.8   Hyper K+ resolved Na+ mildly reduced but stable   14.  Persistent leukocytosis without fever, Improved   1/11 CT surgery saw 1/9. No changes in plan   15.  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

## 2025-01-04 NOTE — Progress Notes (Addendum)
 Patient ID: Alejandro Lopez, male   DOB: July 07, 1981, 44 y.o.   MRN: [id number]  Have checked with every home health agency and none of them will accept his insurance, met with pt and wife to give update and discuss he will need to do OP therapy and cardiac rehab would be the most appropriate for him due to issues are deconditioning. Julia-OT reports no OT services are needed. Wife and pt have been trained on dressing changes and illestomy care and supplies given to them for home until the ones WOC RN ordered are at their home. Equipment delivered to room.  11:39 AM Both report he has no MD listed on medicaid care for PCP. Brandi-NP to place ostomy referral for the OP clinic for follow up for pt and wife

## 2025-01-04 NOTE — Discharge Summary (Signed)
 Physician Discharge Summary  Patient ID: Alejandro Lopez MRN: [id number] DOB/AGE: 08/14/1981 44 y.o.  Admit date: 12/28/2024 Discharge date: 01/04/2025  Discharge Diagnoses:  Principal Problem:   Debility Active Problems:   Tobacco use   Abnormal LFTs   Acute CHF (HCC)   Acute HFrEF (heart failure with reduced ejection fraction) (HCC)   HFrEF (heart failure with reduced ejection fraction) (HCC)   Nonrheumatic mitral valve regurgitation   Severe aortic regurgitation   Acute respiratory failure with hypoxia (HCC)   Physical deconditioning   Ileostomy present (HCC)   Severe protein-energy malnutrition   S/P MVR (mitral valve replacement)   Discharged Condition: stable  Significant Diagnostic Studies: ECHOCARDIOGRAM LIMITED Result Date: 12/31/2024    ECHOCARDIOGRAM LIMITED REPORT   Patient Name:   Alejandro Lopez Date of Exam: 12/31/2024 Medical Rec #:  [id number]        Height:       70.0 in Accession #:    [id number]       Weight:       138.4 lb Date of Birth:  [birth year]       BSA:          1.785 m Patient Age:    44 years         BP:           115/85 mmHg Patient Gender: M                HR:           110 bpm. Exam Location:  Inpatient Procedure: Limited Echo, Cardiac Doppler and Color Doppler (Both Spectral and            Color Flow Doppler were utilized during procedure). Indications:    Pericardial effusion I31.3  History:        Patient has prior history of Echocardiogram examinations, most                 recent 12/25/2024. CHF, Pericardial Disease; Aortic Valve Disease                 and Mitral Valve Disease.                 Aortic Valve: 23 mm INSPIRIS RESILIA AORTIC VALVE 23mm                 Replacement valve is present in the aortic position. Procedure                 Date: 11/30/24.                 Mitral Valve: 27 mm MITRIS RESILIA MITRAL VALVE 27mm valve is                 present in the mitral position. Procedure Date: 11/30/24.  Sonographer:    Jayson Gaskins Referring Phys:  MANUELITA GARRE [facility] IMPRESSIONS  1. Left ventricular ejection fraction, by estimation, is 30%.  2. The mitral valve has been repaired/replaced. The mean mitral valve gradient is 8.7 mmHg. There is a 27 mm MITRIS RESILIA MITRAL VALVE 27mm present in the mitral position. Procedure Date: 11/30/24.  3. Large pericardial effusion. The pericardial effusion is posterior to the left ventricle. There is no evidence of cardiac tamponade.  4. The aortic valve has been repaired/replaced. There is a 23 mm INSPIRIS RESILIA AORTIC VALVE 23mm Replacement valve present in the aortic position. Procedure Date: 11/30/24.  5. The inferior vena cava is normal in  size with greater than 50% respiratory variability, suggesting right atrial pressure of 3 mmHg. Comparison(s): Prior images reviewed side by side. Changes from prior study are noted. LVEF slightly decreased compared to prior, though rate faster. Conclusion(s)/Recommendation(s): Large effusion remains, though IVC collapses, suggesting no tamponade. FINDINGS  Left Ventricle: Left ventricular ejection fraction, by estimation, is 30%. Abnormal (paradoxical) septal motion consistent with post-operative status. Pericardium: TV inflow velocities without significant respiratory variation. MV inflow velocities have dropout during respiratory variation, cannot tell if it is true variation or dropout. IVC collapses, supports no tamponade. A large pericardial effusion is present. The pericardial effusion is posterior to the left ventricle. There is no evidence of cardiac tamponade. Mitral Valve: The mitral valve has been repaired/replaced. There is a 27 mm MITRIS RESILIA MITRAL VALVE 27mm present in the mitral position. Procedure Date: 11/30/24. MV peak gradient, 18.5 mmHg. The mean mitral valve gradient is 8.7 mmHg with average heart rate of 108 bpm. Tricuspid Valve: The tricuspid valve is normal in structure. Tricuspid valve regurgitation is trivial. No evidence of tricuspid stenosis. Aortic  Valve: The aortic valve has been repaired/replaced. There is a 23 mm INSPIRIS RESILIA AORTIC VALVE 23mm Replacement valve present in the aortic position. Procedure Date: 11/30/24. Venous: The inferior vena cava is normal in size with greater than 50% respiratory variability, suggesting right atrial pressure of 3 mmHg. LEFT VENTRICLE PLAX 2D LVIDd:         5.60 cm LVIDs:         4.50 cm LV PW:         1.30 cm LV IVS:        1.10 cm LVOT diam:     1.80 cm LV SV:         58 LV SV Index:   33 LVOT Area:     2.54 cm  LEFT ATRIUM           Index LA Vol (A4C): 72.1 ml 40.39 ml/m  AORTIC VALVE LVOT Vmax:   160.00 cm/s LVOT Vmean:  120.000 cm/s LVOT VTI:    0.229 m MITRAL VALVE MV Area VTI:  1.68 cm    SHUNTS MV Peak grad: 18.5 mmHg   Systemic VTI:  0.23 m MV Mean grad: 8.7 mmHg    Systemic Diam: 1.80 cm MV Vmax:      2.15 m/s MV Vmean:     150.0 cm/s Shelda Bruckner MD Electronically signed by Shelda Bruckner MD Signature Date/Time: 12/31/2024/7:39:12 PM    Final    ECHOCARDIOGRAM LIMITED Result Date: 12/25/2024    ECHOCARDIOGRAM LIMITED REPORT   Patient Name:   Alejandro Lopez Date of Exam: 12/25/2024 Medical Rec #:  [id number]        Height:       70.0 in Accession #:    [id number]       Weight:       134.5 lb Date of Birth:  11-26-1981       BSA:          1.763 m Patient Age:    44 years         BP:           105/69 mmHg Patient Gender: M                HR:           96 bpm. Exam Location:  Inpatient Procedure: Limited Echo, Cardiac Doppler and Limited Color Doppler (Both  Spectral and Color Flow Doppler were utilized during procedure). Indications:    Pericardial Effusion  History:        Patient has prior history of Echocardiogram examinations, most                 recent 12/20/2024. Aortic Valve Disease and Mitral Valve                 Disease.                 Aortic Valve: 23 mm bioprosthetic valve is present in the aortic                 position. Procedure Date: 11/30/24.                  Mitral Valve: 27 mm bioprosthetic valve valve is present in the                 mitral position. Procedure Date: 11/30/24.  Sonographer:    Koleen Popper RDCS Referring Phys: MANUELITA GARRE [facility] IMPRESSIONS  1. Left ventricular ejection fraction, by estimation, is 50 to 55%. The left ventricle has low normal function. The left ventricular internal cavity size was mildly dilated. There is moderate eccentric left ventricular hypertrophy. Left ventricular diastolic function could not be evaluated.  2. The mitral valve has been repaired/replaced. There is a 27 mm bioprosthetic valve present in the mitral position. Procedure Date: 11/30/24.  3. Large, primarily posterior pericardial effusion. Reduced from prior. Large pericardial effusion. There is no evidence of cardiac tamponade.  4. The aortic valve has been repaired/replaced. There is a 23 mm bioprosthetic valve present in the aortic position. Procedure Date: 11/30/24. Echo findings are consistent with normal structure and function of the aortic valve prosthesis. Aortic valve Vmax measures 2.72 m/s.  5. There is normal pulmonary artery systolic pressure.  6. The inferior vena cava is normal in size with greater than 50% respiratory variability, suggesting right atrial pressure of 3 mmHg. FINDINGS  Left Ventricle: Left ventricular ejection fraction, by estimation, is 50 to 55%. The left ventricle has low normal function. The left ventricular internal cavity size was mildly dilated. There is moderate eccentric left ventricular hypertrophy. Abnormal  (paradoxical) septal motion consistent with post-operative status. Left ventricular diastolic function could not be evaluated. Left ventricular diastolic function could not be evaluated due to mitral valve replacement. Right Ventricle: There is normal pulmonary artery systolic pressure. The tricuspid regurgitant velocity is 2.64 m/s, and with an assumed right atrial pressure of 3 mmHg, the estimated right ventricular  systolic pressure is 30.9 mmHg. Pericardium: Large, primarily posterior pericardial effusion. Reduced from prior. A large pericardial effusion is present. There is no evidence of cardiac tamponade. Mitral Valve: The mitral valve has been repaired/replaced. There is a 27 mm bioprosthetic valve present in the mitral position. Procedure Date: 11/30/24. MV peak gradient, 17.1 mmHg. The mean mitral valve gradient is 9.0 mmHg. Tricuspid Valve: The tricuspid valve is normal in structure. Tricuspid valve regurgitation is mild. Aortic Valve: The aortic valve has been repaired/replaced. Aortic valve mean gradient measures 15.0 mmHg. Aortic valve peak gradient measures 29.5 mmHg. Aortic valve area, by VTI measures 1.70 cm. There is a 23 mm bioprosthetic valve present in the aortic position. Procedure Date: 11/30/24. Echo findings are consistent with normal structure and function of the aortic valve prosthesis. Venous: The inferior vena cava is normal in size with greater than 50% respiratory variability, suggesting right atrial pressure of 3 mmHg.  Additional Comments: Spectral Doppler performed. Color Doppler performed.  LEFT VENTRICLE PLAX 2D LVIDd:         5.50 cm      Diastology LVIDs:         4.30 cm      LV e' medial:    7.05 cm/s LV PW:         1.20 cm      LV E/e' medial:  19.9 LV IVS:        1.50 cm      LV e' lateral:   6.30 cm/s LVOT diam:     1.90 cm      LV E/e' lateral: 22.2 LV SV:         64 LV SV Index:   36 LVOT Area:     2.84 cm  LV Volumes (MOD) LV vol d, MOD A2C: 145.0 ml LV vol s, MOD A2C: 74.7 ml LV SV MOD A2C:     70.3 ml RIGHT VENTRICLE             IVC RV S prime:     10.20 cm/s  IVC diam: 1.60 cm LEFT ATRIUM         Index LA diam:    3.90 cm 2.21 cm/m  AORTIC VALVE AV Area (Vmax):    1.79 cm AV Area (Vmean):   1.78 cm AV Area (VTI):     1.70 cm AV Vmax:           271.50 cm/s AV Vmean:          176.500 cm/s AV VTI:            0.380 m AV Peak Grad:      29.5 mmHg AV Mean Grad:      15.0 mmHg LVOT  Vmax:         171.00 cm/s LVOT Vmean:        111.000 cm/s LVOT VTI:          0.227 m LVOT/AV VTI ratio: 0.60  AORTA Ao Root diam: 3.70 cm MITRAL VALVE                TRICUSPID VALVE MV Area (PHT): 3.72 cm     TR Peak grad:   27.9 mmHg MV Area VTI:   1.46 cm     TR Vmax:        264.00 cm/s MV Peak grad:  17.1 mmHg MV Mean grad:  9.0 mmHg     SHUNTS MV Vmax:       2.07 m/s     Systemic VTI:  0.23 m MV Vmean:      143.0 cm/s   Systemic Diam: 1.90 cm MV Decel Time: 204 msec MV E velocity: 140.00 cm/s MV A velocity: 180.00 cm/s MV E/A ratio:  0.78 Morene Brownie Electronically signed by Morene Brownie Signature Date/Time: 12/25/2024/11:52:06 AM    Final    DG CHEST PORT 1 VIEW Result Date: 12/21/2024 EXAM: 1 VIEW(S) XRAY OF THE CHEST 12/21/2024 07:27:00 AM COMPARISON: 12/18/2024 CLINICAL HISTORY: Tracheostomy present (HCC) FINDINGS: LINES, TUBES AND DEVICES: Tracheostomy tube in place. Right IJ approach central venous catheter in place with tip at mid SVC level. Right PICC line removed. Enteric tube passes below left hemidiaphragm with tip excluded from field of view. LUNGS AND PLEURA: Persistent ill-defined opacities within right mid to lower lung. Persistent small right pleural effusion. Ill-defined left retrocardiac opacity, likely atelectasis. No pneumothorax. HEART AND MEDIASTINUM: Stable cardiomegaly. Prior median sternotomy and  cardiac valve replacement. BONES AND SOFT TISSUES: No acute osseous abnormality. IMPRESSION: 1. Persistent ill-defined right mid to lower lung opacities with small right pleural effusion and left basilar atelectasis. 2. Stable cardiomegaly. Electronically signed by: Michaeline Blanch MD 12/21/2024 11:21 AM EST RP Workstation: [id number]   ECHOCARDIOGRAM LIMITED Result Date: 12/20/2024    ECHOCARDIOGRAM LIMITED REPORT   Patient Name:   QUINTAVIS BRANDS Date of Exam: 12/20/2024 Medical Rec #:  [id number]        Height:       70.0 in Accession #:    [id number]       Weight:       159.0 lb  Date of Birth:  Feb 22, 1981       BSA:          1.893 m Patient Age:    44 years         BP:           109/78 mmHg Patient Gender: M                HR:           88 bpm. Exam Location:  Inpatient Procedure: Limited Echo, Cardiac Doppler and Color Doppler (Both Spectral and            Color Flow Doppler were utilized during procedure). Indications:    CHF-acute systolic 150.21  History:        Patient has prior history of Echocardiogram examinations, most                 recent 12/09/2024. CHF; Risk Factors:Hypertension and Current                 Smoker.                 Aortic Valve: 23 mm Inspiris Resilia bioprosthetic valve is                 present in the aortic position. Procedure Date: 11/30/24.                 Mitral Valve: 27 mm Mitris Resilia bioprosthetic valve valve is                 present in the mitral position. Procedure Date: 11/30/24.  Sonographer:    Merlynn Argyle Referring Phys: [phone] [facility] IMPRESSIONS  1. Left ventricular ejection fraction, by estimation, is 40 to 45%. The left ventricle has mildly decreased function. The left ventricle demonstrates global hypokinesis. There is mild left ventricular hypertrophy.  2. Right ventricular systolic function is mildly reduced. The right ventricular size is normal.  3. The mitral valve has been repaired/replaced. There is a 27 mm Mitris Resilia bioprosthetic valve present in the mitral position. Procedure Date: 11/30/24. Echo findings are consistent with normal structure and function of the mitral valve prosthesis.  4. Large pericardial effusion. The pericardial effusion is mostly posterior and lateral to the left ventricle. There is no evidence of cardiac tamponade.  5. The aortic valve has been repaired/replaced. There is a 23 mm Inspiris Resilia bioprosthetic valve present in the aortic position. Procedure Date: 11/30/24. Echo findings are consistent with normal structure and function of the aortic valve prosthesis. FINDINGS  Left Ventricle: Left  ventricular ejection fraction, by estimation, is 40 to 45%. The left ventricle has mildly decreased function. The left ventricle demonstrates global hypokinesis. The left ventricular internal cavity size was normal in size. There is  mild left ventricular hypertrophy.  Right Ventricle: The right ventricular size is normal. Right ventricular systolic function is mildly reduced. Pericardium: A large pericardial effusion is present. The pericardial effusion is posterior and lateral to the left ventricle. There is no evidence of cardiac tamponade. Mitral Valve: The mitral valve has been repaired/replaced. There is a 27 mm Mitris Resilia bioprosthetic valve present in the mitral position. Procedure Date: 11/30/24. Echo findings are consistent with normal structure and function of the mitral valve prosthesis. Aortic Valve: The aortic valve has been repaired/replaced. There is a 23 mm Inspiris Resilia bioprosthetic valve present in the aortic position. Procedure Date: 11/30/24. Echo findings are consistent with normal structure and function of the aortic valve prosthesis. LEFT VENTRICLE PLAX 2D LVIDd:         5.70 cm      Diastology LVIDs:         4.70 cm      LV e' medial:    7.15 cm/s LV PW:         1.40 cm      LV E/e' medial:  22.8 LV IVS:        1.20 cm      LV e' lateral:   9.32 cm/s                             LV E/e' lateral: 17.5  LV Volumes (MOD) LV vol d, MOD A2C: 184.0 ml LV vol d, MOD A4C: 185.0 ml LV vol s, MOD A2C: 82.7 ml LV vol s, MOD A4C: 106.0 ml LV SV MOD A2C:     101.3 ml LV SV MOD A4C:     185.0 ml LV SV MOD BP:      90.1 ml RIGHT VENTRICLE            IVC RV S prime:     9.29 cm/s  IVC diam: 1.80 cm TAPSE (M-mode): 1.5 cm MITRAL VALVE                TRICUSPID VALVE MV Area (PHT): 2.99 cm     TR Peak grad:   33.2 mmHg MV Decel Time: 254 msec     TR Vmax:        288.00 cm/s MV E velocity: 163.00 cm/s MV A velocity: 180.00 cm/s MV E/A ratio:  0.91 Oneil Parchment MD Electronically signed by Oneil Parchment MD  Signature Date/Time: 12/20/2024/4:06:52 PM    Final    DG CHEST PORT 1 VIEW Result Date: 12/18/2024 CLINICAL DATA:  Chest tube in place. EXAM: PORTABLE CHEST 1 VIEW COMPARISON:  12/15/2024 FINDINGS: Tracheostomy tube tip at the thoracic inlet. Right internal jugular and right upper extremity PICC tip in the SVC. Right-sided chest tube with tip directed cranially. Previous Impella device is been removed. The previous enteric tube has been removed. Stable cardiomegaly post median sternotomy. Prosthetic cardiac valves. Bibasilar volume loss with pleural effusions and ill-defined opacities, without significant interval change. No visible pneumothorax. Slight vascular congestion. IMPRESSION: 1. Removal of enteric tube and Impella device. 2. Right-sided chest tube with tip directed cranially. No visible pneumothorax. 3. Bibasilar volume loss with pleural effusions and ill-defined opacities, without significant interval change. Electronically Signed   By: Andrea Gasman M.D.   On: 12/18/2024 14:01   DG Abd 1 View Result Date: 12/16/2024 EXAM: 1 VIEW XRAY OF THE ABDOMEN 12/16/2024 09:01:00 PM COMPARISON: Portable abdomen film 12/14/2024 at 8:12 pm. CLINICAL HISTORY: [id number] Encounter for imaging study to confirm  nasogastric (NG) tube placement [id number] Encounter for imaging study to confirm nasogastric (NG) tube placement FINDINGS: LINES, TUBES AND DEVICES: NGT is again noted and has been advanced into the descending duodenum. BOWEL: Nonobstructive bowel gas pattern. There is mild dilatation of the small bowel in the left mid abdomen measuring 3.8 cm. No other visible dilated bowel. SOFT TISSUES: No abnormal calcifications. BONES: No acute fracture. IMPRESSION: 1. NG tube tip in the descending duodenum. 2. Mild small bowel dilatation in the left mid abdomen measuring up to 3.8 cm, without other dilated bowel loops. Electronically signed by: Francis Quam MD 12/16/2024 09:11 PM EST RP Workstation: [id number]   DG  Chest Port 1 View Result Date: 12/15/2024 CLINICAL DATA:  Tracheostomy EXAM: PORTABLE CHEST 1 VIEW COMPARISON:  12/14/2024,, 12/13/2024, 12/11/2024, 11/22/2024 FINDINGS: Interval tracheostomy, tip about 4.4 cm superior to the carina. Sternotomy and prosthetic valves. Right upper extremity central venous catheter tip at the proximal right atrial region. Right IJ catheter tip also at the cavoatrial region. Similar positioning of Impella device. Right-sided chest tube remains in place. Enteric tube tip below the diaphragm. Multiple epicardial pacing leads. Coils in the right upper quadrant. Similar cardiomegaly and small right-sided pleural effusion. No change in airspace consolidation at the right middle lobe and right base with mild airspace disease at left lung base. IMPRESSION: 1. Interval tracheostomy, tip about 4.4 cm superior to the carina. 2. Multiple support lines and tubes as above. Postoperative changes of the mediastinum 3. No significant change in cardiomegaly, pleural effusions and right greater than left basilar airspace disease Electronically Signed   By: Luke Bun M.D.   On: 12/15/2024 15:44   DG CHEST PORT 1 VIEW Result Date: 12/14/2024 EXAM: 1 VIEW(S) XRAY OF THE CHEST 12/14/2024 08:15:00 PM COMPARISON: Portable chest earlier today at 5:26 am. CLINICAL HISTORY: Pleural effusion. FINDINGS: LINES, TUBES AND DEVICES: Right PICC terminates at the superior cavoatrial junction. Right IJ central line catheter tip is in the upper right atrium. Impella device terminating in the upper left ventricle. ETT tip is 7.1 cm from the carina in the upper thoracic trachea. NGT extends towards the body of the stomach and off of the plane of imaging. Right chest tube positioning is unchanged. LUNGS AND PLEURA: Right chest tube positioning is unchanged. No measurable pneumothorax. Small pleural effusions are again noted. Asymmetric atelectasis or consolidation in the right lower lung field not significantly  changed. No significant vascular congestion is seen. There is a chronically elevated right hemidiaphragm. HEART AND MEDIASTINUM: The heart silhouette is enlarged. The mediastinum is stable with sternotomy sutures, MVR, AVR, epicardial pacing wires. BONES AND SOFT TISSUES: Sternotomy sutures. Embolization coils to the right of T10-T11. No acute osseous abnormality. IMPRESSION: 1. Small pleural effusions, unchanged. 2. Right chest tube in place with no measurable pneumothorax. 3. Asymmetric atelectasis or consolidation in the right lower lung field, not significantly changed. 4. Cardiomegaly. Electronically signed by: Francis Quam MD 12/14/2024 08:39 PM EST RP Workstation: [id number]   DG Abd 1 View Result Date: 12/14/2024 EXAM: 1 VIEW XRAY OF THE ABDOMEN 12/14/2024 08:15:00 PM COMPARISON: Chest, abdomen, and pelvis CT 12/09/2024. CLINICAL HISTORY: [id number] Encounter for nasogastric (NG) tube placement [id number] Encounter for nasogastric (NG) tube placement. FINDINGS: LINES, TUBES AND DEVICES: NGT is well inside the stomach with the tip directed to the right in the body of the stomach. BOWEL: The visualized bowel pattern appears nonobstructive. SOFT TISSUES: Suture material in the right mid abdomen. BONES: Embolization coils to the right of T10-T11. No acute  fracture. LIMITATIONS/ARTIFACTS: The pelvis was not included in the study. VISUALIZED CHEST: Chronic elevation of the right hemidiaphragm. The heart is enlarged. Sternotomy sutures, AVR, and MVR are again noted with an Impella device projecting into the left ventricle. There is epicardial pacing wiring. IMPRESSION: 1. NG tube in appropriate position. 2. Nonobstructive bowel gas pattern. Electronically signed by: Francis Quam MD 12/14/2024 08:32 PM EST RP Workstation: [id number]   DG Chest Port 1 View Result Date: 12/14/2024 CLINICAL DATA:  Ventilator dependence. EXAM: PORTABLE CHEST 1 VIEW COMPARISON:  12/13/2024 FINDINGS: Low volumes. The cardio pericardial  silhouette is enlarged. Interval progression of right base collapse/consolidation with probable effusion. Endotracheal tube tip is 4.3 cm above the base of the carina. NG tube tip is in the stomach with proximal side port positioned at the level of the distal esophagus. Impella device is stable in position. Right IJ central line tip overlies the mid to lower SVC level. Right PICC line tip overlies the upper right atrium. Right chest tube remains in place. Trace right apical pneumothorax seen on the previous study not evident on the current exam. IMPRESSION: 1. Interval progression of right base collapse/consolidation with probable effusion. 2. Trace right apical pneumothorax seen on the previous study not evident on the current exam. 3. NG tube tip is in the stomach with proximal side port positioned at the level of the distal esophagus. Electronically Signed   By: Camellia Candle M.D.   On: 12/14/2024 06:54   DG Chest Port 1 View Result Date: 12/13/2024 CLINICAL DATA:  Central line placement. EXAM: PORTABLE CHEST 1 VIEW COMPARISON:  Radiograph earlier today FINDINGS: New right internal jugular central venous catheter tip overlies the mid lower SVC. Previous weighted enteric tube is been removed, there is a new non weighted enteric tube with tip below the diaphragm, side-port in the distal esophagus. Stable remaining support apparatus include Impella device, right upper extremity PICC, enteric tube and left internal jugular sheath. Right-sided chest tube with tip directed towards the apex. Post median sternotomy with prosthetic cardiac valves. Stable heart size and mediastinal contours. Trace right apical pneumothorax. Improved right lung base aeration. IMPRESSION: 1. New right internal jugular central venous catheter tip overlies the mid lower SVC. 2. New non weighted enteric tube with tip below the diaphragm, side-port in the distal esophagus. Recommend advancement. 3. Trace right apical pneumothorax with chest  tube in place. 4. Mildly improved right lung base aeration. Electronically Signed   By: Andrea Gasman M.D.   On: 12/13/2024 16:24   Portable Chest x-ray Result Date: 12/13/2024 EXAM: 1 VIEW(S) XRAY OF THE CHEST 12/13/2024 07:00:15 AM COMPARISON: 12/12/2024 CLINICAL HISTORY: Endotracheal tube present FINDINGS: LINES, TUBES AND DEVICES: Impella device stable in position. Right PICC with tip in expected region of superior cavoatrial junction. Enteric tube courses below with tip and side port in expected region of gastric lumen. Left internal jugular venous sheath in place. PA catheter removed. Endotracheal tube with tip 3 cm above carina. Stable right chest tube. LUNGS AND PLEURA: Decreased aeration of right lung with increased patchy right lung base opacities. Increased interstitial markings. Small right pleural effusion increased. No pneumothorax. HEART AND MEDIASTINUM: Persistent cardiomegaly. BONES AND SOFT TISSUES: Post median sternotomy. STOMACH AND BOWEL: Gaseous distention of stomach. IMPRESSION: 1. Decreased aeration of the right lung with increased patchy right basilar opacities, increased interstitial markings, and a small right pleural effusion. Electronically signed by: Waddell Calk MD 12/13/2024 07:09 AM EST RP Workstation: [id number]   DG Chest Port 1 [address]  Result Date: 12/12/2024 CLINICAL DATA:  Shortness of breath. EXAM: PORTABLE CHEST 1 VIEW COMPARISON:  Chest radiograph dated 12/12/2024. FINDINGS: Slight interval advancement of the of Swan-Ganz with tip projecting over the inferior right pulmonary artery. Additional support apparatus in similar position. Shallow inspiration with bibasilar atelectasis. Pneumonia is not excluded small right pleural effusion. No pneumothorax. There is cardiomegaly. No acute osseous pathology. Median sternotomy wires. IMPRESSION: Slight interval advancement of the Swan-Ganz with tip projecting over the inferior right pulmonary artery. Electronically Signed    By: Vanetta Chou M.D.   On: 12/12/2024 17:29   DG Chest Port 1 View Result Date: 12/12/2024 EXAM: 1 VIEW(S) XRAY OF THE CHEST 12/12/2024 07:46:00 AM COMPARISON: 12/11/2024 CLINICAL HISTORY: SOB (shortness of breath) FINDINGS: LINES, TUBES AND DEVICES: Impella device in place. Left IJ Swan-Ganz catheter in place with tip overlying right pulmonary artery. Right chest tube in place. Enteric tube in place with distal tip beyond inferior margin of film. LUNGS AND PLEURA: Low lung volumes. Mild pulmonary edema. Hazy bibasilar opacities. Right pleural effusion, unchanged. SABRA No pneumothorax. HEART AND MEDIASTINUM: Status post median sternotomy and valve replacement. BONES AND SOFT TISSUES: Status post median sternotomy and valve replacement. IMPRESSION: 1. Unchanged mild pulmonary edema with right pleural effusion and bibasilar atelectasis. 2. Impella device, left IJ Swan-Ganz catheter, right chest tube, and enteric tube in place. Electronically signed by: Waddell Calk MD 12/12/2024 07:53 AM EST RP Workstation: [id number]   DG CHEST PORT 1 VIEW Result Date: 12/11/2024 CLINICAL DATA:  Status post cardiac surgery. EXAM: PORTABLE CHEST 1 VIEW COMPARISON:  12/10/2024 and CT dated 12/09/2024. FINDINGS: Stable enlarged cardiac silhouette and prosthetic aortic and mitral valves. The right jugular Impella device tip remains in the left ventricle. The left jugular Swan-Ganz catheter tip is in the right main pulmonary artery. Endotracheal tube in satisfactory position. Right PICC tip in the region of the superior cavoatrial junction. Nasogastric tube extending into the stomach with its tip not included. The right pleural catheter has been replaced with a large-bore chest tube with no pneumothorax seen. The previously demonstrated right pleural fluid has almost completely resolved. Decreased right basilar atelectasis and increased patchy density in the left lower lobe. Thoracic spine degenerative changes. IMPRESSION: 1.  The previously demonstrated right pleural fluid has almost completely resolved following placement of a large-bore chest tube. 2. Decreased right basilar atelectasis and increased patchy atelectasis or pneumonia in the left lower lobe. 3. Stable cardiomegaly. Electronically Signed   By: Elspeth Bathe M.D.   On: 12/11/2024 13:38   DG CHEST PORT 1 VIEW Result Date: 12/10/2024 CLINICAL DATA:  Post cardiac surgery. EXAM: PORTABLE CHEST 1 VIEW COMPARISON:  12/09/2024 FINDINGS: Right subclavian Impella device unchanged. Endotracheal tube has tip 5.4 cm above the carina. Nasogastric tube courses into the region of the stomach where it is looped as tip is not visualized. Left IJ Swan-Ganz catheter has tip over the right main pulmonary artery. Right-sided PICC line with tip over the SVC. Prosthetic heart valves unchanged. Lungs are hypoinflated with moderate size right pleural effusion likely with associated basilar atelectasis unchanged. No pneumothorax. Mild cardiomegaly. Remainder of the exam is unchanged. IMPRESSION: 1. Hypoinflation with moderate size right pleural effusion likely with associated basilar atelectasis unchanged. 2. Tubes and lines as described. Electronically Signed   By: Toribio Agreste M.D.   On: 12/10/2024 09:11   VAS US  LOWER EXTREMITY VENOUS (DVT) Result Date: 12/09/2024  Lower Venous DVT Study Patient Name:  Danial M Nocera  Date of Exam:  12/09/2024 Medical Rec #: [id number]         Accession #:    [id number] Date of Birth: 09-15-1981        Patient Gender: M Patient Age:   19 years Exam Location:  [facility] Procedure:      VAS US  LOWER EXTREMITY VENOUS (DVT) Referring Phys: TORIBIO SHARPS --------------------------------------------------------------------------------  Indications: Edema.  Risk Factors: None identified. Limitations: Poor ultrasound/tissue interface and patient positioning, patient immobility. Comparison Study: No prior studies. Performing Technologist: Cordella Collet RVT  Examination Guidelines: A complete evaluation includes B-mode imaging, spectral Doppler, color Doppler, and power Doppler as needed of all accessible portions of each vessel. Bilateral testing is considered an integral part of a complete examination. Limited examinations for reoccurring indications may be performed as noted. The reflux portion of the exam is performed with the patient in reverse Trendelenburg.  +---------+---------------+---------+-----------+----------+--------------+ RIGHT    CompressibilityPhasicitySpontaneityPropertiesThrombus Aging +---------+---------------+---------+-----------+----------+--------------+ CFV      Full           Yes      Yes                                 +---------+---------------+---------+-----------+----------+--------------+ SFJ      Full                                                        +---------+---------------+---------+-----------+----------+--------------+ FV Prox  Full                                                        +---------+---------------+---------+-----------+----------+--------------+ FV Mid   Full                                                        +---------+---------------+---------+-----------+----------+--------------+ FV DistalFull                                                        +---------+---------------+---------+-----------+----------+--------------+ PFV      Full                                                        +---------+---------------+---------+-----------+----------+--------------+ POP      Full           Yes      Yes                                 +---------+---------------+---------+-----------+----------+--------------+ PTV      Full                                                        +---------+---------------+---------+-----------+----------+--------------+  PERO     Full                                                         +---------+---------------+---------+-----------+----------+--------------+   +---------+---------------+---------+-----------+----------+--------------+ LEFT     CompressibilityPhasicitySpontaneityPropertiesThrombus Aging +---------+---------------+---------+-----------+----------+--------------+ CFV      Full           Yes      Yes                                 +---------+---------------+---------+-----------+----------+--------------+ SFJ      Full                                                        +---------+---------------+---------+-----------+----------+--------------+ FV Prox  Full                                                        +---------+---------------+---------+-----------+----------+--------------+ FV Mid   Full                                                        +---------+---------------+---------+-----------+----------+--------------+ FV Distal               Yes      Yes                                 +---------+---------------+---------+-----------+----------+--------------+ PFV      Full                                                        +---------+---------------+---------+-----------+----------+--------------+ POP      Full           Yes      Yes                                 +---------+---------------+---------+-----------+----------+--------------+ PTV      Full                                                        +---------+---------------+---------+-----------+----------+--------------+ PERO     Full                                                        +---------+---------------+---------+-----------+----------+--------------+  Summary: RIGHT: - There is no evidence of deep vein thrombosis in the lower extremity. However, portions of this examination were limited- see technologist comments above.  - No cystic structure found in the popliteal fossa.  LEFT: - There is no evidence of deep vein  thrombosis in the lower extremity. However, portions of this examination were limited- see technologist comments above.  - No cystic structure found in the popliteal fossa.  *See table(s) above for measurements and observations. Electronically signed by Gaile New MD on 12/09/2024 at 8:42:38 PM.    Final    ECHOCARDIOGRAM LIMITED Result Date: 12/09/2024    ECHOCARDIOGRAM LIMITED REPORT   Patient Name:   XIOMAR CROMPTON Date of Exam: 12/09/2024 Medical Rec #:  [id number]        Height:       70.0 in Accession #:    [id number]       Weight:       179.7 lb Date of Birth:  [birth year]       BSA:          1.994 m Patient Age:    44 years         BP:           110/88 mmHg Patient Gender: M                HR:           98 bpm. Exam Location:  Inpatient Procedure: Limited Echo (Both Spectral and Color Flow Doppler were utilized            during procedure). Indications:    Impella Support  History:        Patient has prior history of Echocardiogram examinations, most                 recent 12/08/2024.  Sonographer:    Jayson Gaskins Referring Phys: [phone] ALMA L DIAZ IMPRESSIONS  1. Left ventricular ejection fraction, by estimation, is 25 to 30%. The left ventricle has severely decreased function. The left ventricle demonstrates global hypokinesis.  2. Right ventricular systolic function is moderately reduced.  3. Moderate pericardial effusion. The pericardial effusion is circumferential.  4. The mitral valve has been repaired/replaced.  5. The aortic valve has been repaired/replaced. Conclusion(s)/Recommendation(s): Limited echo to assess Impell position. Impella well postioned with inflow cage 5.27 cm from AoV. FINDINGS  Left Ventricle: Left ventricular ejection fraction, by estimation, is 25 to 30%. The left ventricle has severely decreased function. The left ventricle demonstrates global hypokinesis. Right Ventricle: Right ventricular systolic function is moderately reduced. Pericardium: A moderately sized  pericardial effusion is present. The pericardial effusion is circumferential. The pericardial effusion appears to contain mixed echogenic material. Mitral Valve: The mitral valve has been repaired/replaced. There is a bioprosthetic valve present in the mitral position. Aortic Valve: The aortic valve has been repaired/replaced. There is a bioprosthetic valve present in the aortic position. Toribio Fuel MD Electronically signed by Toribio Fuel MD Signature Date/Time: 12/09/2024/7:20:05 PM    Final    DG CHEST PORT 1 VIEW Result Date: 12/09/2024 EXAM: 1 VIEW(S) XRAY OF THE CHEST 12/09/2024 07:12:48 AM COMPARISON: 12/08/2024 CLINICAL HISTORY: Status post cardiac surgery FINDINGS: LINES, TUBES AND DEVICES: Impella device in place with distal tip over the left heart. Left IJ arterial catheter in place with tip projecting over the right artery. Enteric tube in place with tip and side port in the stomach. Endotracheal tube in place with tip 6 cm above the carina.  Right chest tube in place. LUNGS AND PLEURA: Increased hazy opacity in the right mid to lower lung, likely a combination of layering pleural fluid and atelectasis. No pneumothorax. 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 Stable cardiomegaly. BONES AND SOFT TISSUES: No acute osseous abnormality. IMPRESSION: 1. Increased hazy opacity in the right mid to lower lung, likely a combination of layering pleural fluid and atelectasis. 2. 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 3. Impella device, left IJ arterial catheter, enteric tube, endotracheal tube, and right chest tube in place. Electronically signed by: Evalene Coho MD 12/09/2024 11:23 AM EST RP Workstation: [id number]   CT CHEST ABDOMEN PELVIS WO CONTRAST Result Date: 12/09/2024 EXAM: CT CHEST, ABDOMEN AND PELVIS WITHOUT CONTRAST 12/09/2024 10:52:00 AM TECHNIQUE: CT of the chest, abdomen and pelvis was  performed without the administration of intravenous contrast. Multiplanar reformatted images are provided for review. Automated exposure control, iterative reconstruction, and/or weight based adjustment of the mA/kV was utilized to reduce the radiation dose to as low as reasonably achievable. COMPARISON: CT of the chest, abdomen and pelvis dated 12/03/2024. CLINICAL HISTORY: evaluate right hemothorax evaluate right hemothorax FINDINGS: CHEST: MEDIASTINUM AND LYMPH NODES: Heart is enlarged and the patient is status post aortic and mitral valvular replacement. An Impella device is again noted within the left ventricle. A Swan-Ganz catheter is also present with its tip in the right pulmonary artery. There is a moderate pericardial effusion, which has increased in size since the previous study. An endotracheal tube is in good position above the carina. No mediastinal, hilar or axillary lymphadenopathy. LUNGS AND PLEURA: Consolidation/atelectasis present dependently within the right lung. The right lower lobe is nearly completely opacified. There are biapical pulmonary blebs. A right-sided pigtail chest catheter remains in place. There is a small volume of pleural fluid. No pneumothorax. ABDOMEN AND PELVIS: LIVER: The liver is unremarkable. GALLBLADDER AND BILE DUCTS: Radiopaque material layering dependently within the gallbladder. No biliary ductal dilatation. SPLEEN: No acute abnormality. PANCREAS: No acute abnormality. ADRENAL GLANDS: No acute abnormality. KIDNEYS, URETERS AND BLADDER: No stones in the kidneys or ureters. No hydronephrosis. No perinephric or periureteral stranding. Foley catheter is present within the urinary bladder. GI AND BOWEL: Stomach demonstrates no acute abnormality. An enteric catheter has been placed and its tip is in the distal stomach. The patient is status post right colectomy. 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 There is no bowel obstruction. REPRODUCTIVE ORGANS: No acute  abnormality. PERITONEUM AND RETROPERITONEUM: No ascites. No free air. VASCULATURE: Aorta is normal in caliber. ABDOMINAL AND PELVIS LYMPH NODES: No lymphadenopathy. BONES AND SOFT TISSUES: Patient is status post sternotomy. A right arm PICC is present with its tip at the superior atrial caval junction. There is moderate soft tissue edema within the subcutaneous fat of the abdomen and pelvis. No acute osseous abnormality. IMPRESSION: 1. Small residual right pleural fluid/hemothorax with right-sided pigtail chest catheter in place and dependent right lung consolidation/atelectasis with near complete right lower lobe opacification. 2. Moderate pericardial effusion, increased in size since the previous study. 3. Status post aortic and mitral valvular replacement with Impella device in the left ventricle and Swan-Ganz catheter in the right pulmonary artery. Additional support devices include endotracheal tube, enteric tube with tip in the distal stomach, and right upper extremity PICC with tip at the superior cavoatrial junction. 4. Status post right colectomy with right periumbilical ileostomy. 5. Radiopaque material layering within the gallbladder, which can be seen with vicarious excretion  of contrast or cholelithiasis. 6. Moderate subcutaneous soft tissue edema of the abdomen and pelvis. Electronically signed by: Evalene Coho MD 12/09/2024 11:21 AM EST RP Workstation: [id number]   ECHOCARDIOGRAM LIMITED Result Date: 12/08/2024    ECHOCARDIOGRAM LIMITED REPORT   Patient Name:   EXCELL NEYLAND Date of Exam: 12/08/2024 Medical Rec #:  [id number]        Height:       70.0 in Accession #:    [id number]       Weight:       180.3 lb Date of Birth:  07-16-1981       BSA:          1.998 m Patient Age:    44 years         BP:           110/82 mmHg Patient Gender: M                HR:           102 bpm. Exam Location:  Inpatient Procedure: Limited Echo (Both Spectral and Color Flow Doppler were utilized             during procedure). Indications:    Impella Check  History:        Patient has prior history of Echocardiogram examinations, most                 recent 12/07/2024. CHF.  Sonographer:    Jayson Gaskins Referring Phys: [phone] DALTON S MCLEAN IMPRESSIONS  1. Bioprosthetic mitral valve replacement, without Doppler assessment.  2. Limited study for Impella cannula position.  3. Moderate to large circumferential pericardial effusion, with fibrinous material.  4. 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 Global left ventricular hypokinesis, paradoxical septal motion, LVEF approximately 30%. There is a catheter/device lead in the right ventricle. Comparison(s): No significant change from prior study. Prior images reviewed side by side. FINDINGS  Left Ventricle: Jerel Balding MD Electronically signed by Jerel Balding MD Signature Date/Time: 12/08/2024/11:15:03 AM    Final    DG CHEST PORT 1 VIEW Result Date: 12/08/2024 CLINICAL DATA:  Endotracheal tube. EXAM: PORTABLE CHEST 1 VIEW COMPARISON:  12/07/2024 FINDINGS: Sternotomy wires and prosthetic heart valves unchanged. Impella device unchanged. Right-sided PICC line unchanged. Nasogastric tube courses into the stomach and off the image as tip is not visualized. Endotracheal tube has tip 5.8 cm above the carina. Left IJ Swan-Ganz catheter has tip over the proximal right lower lobar artery unchanged. Right chest tube unchanged. Exam demonstrates continued opacification over the right mid to lower lung unchanged to slightly improved likely small to moderate effusion with atelectasis. Infection over the mid to lower right lung is possible. No pneumothorax. Improved aeration over the left base/retrocardiac region. Stable cardiomegaly. Remainder of the exam is unchanged. IMPRESSION: 1. Stable cardiomegaly with continued opacification over the right mid to lower lung unchanged to slightly improved likely small to moderate effusion with atelectasis. Infection over the mid to  lower right lung is possible. 2. Tubes and lines as described. Electronically Signed   By: Toribio Agreste M.D.   On: 12/08/2024 09:08   ECHOCARDIOGRAM LIMITED Result Date: 12/07/2024    ECHOCARDIOGRAM LIMITED REPORT   Patient Name:   Carlito M Kopper Date of Exam: 12/07/2024 Medical Rec #:  [id number]        Height:       70.0 in Accession #:    [id number]       Weight:  182.8 lb Date of Birth:  Nov 13, 1981       BSA:          2.009 m Patient Age:    44 years         BP:           100/78 mmHg Patient Gender: M                HR:           105 bpm. Exam Location:  Inpatient Procedure: Limited Echo (Both Spectral and Color Flow Doppler were utilized            during procedure). Indications:    Impella check  History:        Patient has prior history of Echocardiogram examinations, most                 recent 12/04/2024.  Sonographer:    Tinnie Gosling RDCS Referring Phys: [phone] DALTON S MCLEAN IMPRESSIONS  1. Impella 5.5 tip at 4.6 cm from AV.  2. Lead in RV.  3. Right atrial size was Lead in RA.  4. Moderate pericardium effusion (max 1.3 cm) with fibrinous strand. Moderate pericardial effusion. Comparison(s): Changes from prior study are noted. 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 Compared to prior echo, Impella tip is less deep into LV and the pericardial effusion is more significant. FINDINGS  Left Ventricle: Impella 5.5 tip at 4.6 cm from AV. Right Ventricle: Lead in RV. Right Atrium: Right atrial size was Lead in RA. Pericardium: Moderate pericardium effusion (max 1.3 cm) with fibrinous strand. A moderately sized pericardial effusion is present. Joelle Cedars Tonleu Electronically signed by Joelle Cedars Ny Signature Date/Time: 12/07/2024/9:57:48 AM    Final    DG Chest Port 1 View Result Date: 12/07/2024 EXAM: 1 VIEW(S) XRAY OF THE CHEST 12/07/2024 05:17:00 AM COMPARISON: Portable chest from yesterday at 6:42 am. CLINICAL HISTORY: S/P AVR. FINDINGS: LINES, TUBES AND DEVICES: NGT is coiled in the  stomach. Tip of the ETT is 4 cm from the carina. There is stable positioning of the left IJ Swan-Ganz line, right PICC, and a right subclavian Impella device. LUNGS AND PLEURA: Perihilar vascular congestion and interstitial edema are improved. No significant changes seen in bilateral airspace disease. Moderate sized right pleural effusion with pigtail chest catheter in place. No measurable pneumothorax. HEART AND MEDIASTINUM: Cardiomegaly is stable with sternotomy sutures in place, mitral and aortic valve replacements. BONES AND SOFT TISSUES: No acute osseous abnormality. IMPRESSION: 1. Improved perihilar vascular congestion and interstitial edema. 2. Stable moderate-sized right pleural effusion with pigtail chest catheter in place. No measurable pneumothorax. 3. Stable cardiomegaly with sternotomy sutures in place and mitral and aortic valve replacements. 4. No improvement in bilateral airspace disease. Electronically signed by: Francis Quam MD 12/07/2024 06:46 AM EST RP Workstation: [id number]   DG CHEST PORT 1 VIEW Result Date: 12/06/2024 EXAM: 1 VIEW(S) XRAY OF THE CHEST 12/06/2024 07:12:00 AM COMPARISON: Portable chest dated 12/05/2024 07:33:00 AM. CLINICAL HISTORY: [id number] Chest tube in place. FINDINGS: LINES, TUBES AND DEVICES: The left IJ Swan-Ganz line again terminates in the right pulmonary artery. There is stable positioning of ETT, enteric tube, right PICC, and right axillosubclavian Impella. LUNGS AND PLEURA: There is central vascular congestion and mild interstitial edema. There is patchy airspace disease of the lung fields. Right pleural effusion. Unchanged pigtail right pleural tube. Overall aeration seems unchanged. No pneumothorax. HEART AND MEDIASTINUM: There are midline sternotomy sutures, AVR, and MVR. The heart is  enlarged. No acute abnormality of the mediastinal silhouette. BONES AND SOFT TISSUES: No acute osseous abnormality. No new abnormality. IMPRESSION: 1. Stable positioning of ETT,  enteric tube, right PICC, right axillosubclavian Impella, and left IJ Swan-Ganz catheter in the right pulmonary artery. 2. Cardiomegaly with central vascular congestion and mild interstitial edema. 3. Patchy airspace disease with right pleural effusion; unchanged right pleural pigtail catheter. Overall aeration unchanged. Electronically signed by: Francis Quam MD 12/06/2024 07:40 AM EST RP Workstation: [id number]    Labs:  Basic Metabolic Panel: Recent Labs  Lab 12/29/24 0538 12/30/24 0435 12/31/24 0522 01/01/25 0551 01/04/25 0623  NA 125* 127* 126* 130* 130*  K 5.2* 5.1 4.8 4.3 3.9  CL 93* 94* 94* 98 96*  CO2 22 21* 21* 21* 23  GLUCOSE 115* 138* 97 114* 90  BUN 22* 26* 22* 14 9  CREATININE 0.72 0.72 0.65 0.62 0.64  CALCIUM  9.4 9.3 9.8 9.0 9.2  MG 2.0  --   --   --   --   PHOS  --   --   --  4.2  --     CBC: Recent Labs  Lab 12/29/24 0538 12/31/24 0522 01/04/25 0623  WBC 11.2* 12.8* 10.3  NEUTROABS 4.7  --   --   HGB 8.2* 8.9* 8.2*  HCT 24.5* 26.7* 25.0*  MCV 88.4 88.1 89.9  PLT 421* 413* 442*    CBG: Recent Labs  Lab 12/29/24 2006  GLUCAP 124*    Brief HPI:   CHANCELER PULLIN is a 44 y.o. male  with PMHx of mild hypertension alcohol abuse and asthma who presented to [facility] ER on 11/22/24 with complaints of increased swelling to bilateral lower extremities, groin, and abdomen with reports of a fullness sensation in his lower back. He had recently completed a course of Zithromax  for community acquired pneumonia, shortness of breath and nonproductive cough remained.  In the ER patient was hemodynamically stable with oxygen  saturation 97-99% room air.  WBC 10.6, hemoglobin 15.1, platelets 206.  AST 71, ALT 100, alk phosphatase 148, total bilirubin 1.3.  Sodium 141, potassium 3.8, bicarbonate 28, serum creatinine 0.89.  TSH 1.860.  UA was negative for pyuria.  proBNP 3354.  Troponin 27>> 27.  Chest x-ray was negative for edema or infiltrates.  EKG showed sinus rhythm  with nonspecific T wave change.  Patient was started on IV furosemide  and cardiology was consulted for management.  2D echocardiogram on 12/1 showed LVEF 35 to 40%, with severely reduced RV systolic function, severe MR, and severe aortic insufficiency.  There is some nodular thickening at the leaflet tips.  The patient had no evidence of fever or chills and WBC count 10.6 on admission.  Volume status continued to improve with diuresis and GDMT was added for HFrEF/NICM.  On 12/4 cardiology recommended transfer to [facility] for advanced heart failure team assessment.    CTS consulted on 12/5 where a cardiac MRI showed a diastolic diameter of 7.7 cm.  Left ventricular ejection fraction 35% with global hypokinesis.  He underwent right cardiac catheterization which showed normal coronary anatomy.  Etiology suspected to be due to longstanding aortic insufficiency leading to left ventricular dilation and dysfunction with secondary MR due to leaflet tethering and restriction, also complicated by history of heavy alcohol use.  Patient was not a candidate for transplant. He underwent TEE, AVR and MVR with Impella placement on 12/8 -12/24 Dr. Sherrine.  Postop he is transferred to ICU and recovered well initially.  However  postop course became complicated after right thoracentesis with intercostal artery laceration and massive right hemothorax, hemodynamic instability and development of an acute abdomen with air within the wall of the distal small bowel and cecum.  IR consulted on 12/11 for emergent IR embolization performed by Dr. Philip.  On 12/12 patient underwent exploratory lap and small bowel resection with wound VAC placement by Dr. Sebastian.     He returned to the OR on 12/15 for reexploration of abdomen and right colectomy by Dr. Tanda.  On 12/16 patient required small bowel resection of 75 cm of distal ileum with right hemicolectomy and ileostomy. Chest tubes placed for right hemothorax.  He was extubated on 12/19  but reintubated on 12/21 with findings of an ileus.  Tracheostomy was placed on 12/23 and he became febrile postprocedure. IV antibiotics Maxipime  initiated per ID recommendations for positive micafungin  cultures, then transitioned to cefepime .  Impella was removed in the OR on 12/24. Hospital course complicated by atrial fibs/flutter, acute respiratory failure, sepsis with septic shock due to recurrent aspiration pneumonia with Enterobacter, alcohol withdrawal, acute septic encephalopathy, high ostomy output, and anemia of critical illness.  Maintained on heparin  anticoagulation for AF, DVT prophylaxis and AVR/MVR with pericardial valves.  Pharmacy investigated for heparin  to Coumadin  bridge.     He was decannulated on 1/5 and advance to a regular diet, thin liquids and has been tolerating very well with no concerns for aspiration during speech evaluation.  Cortrak remains while calorie count is underway. 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  Per chart review he lives in a 1 level home with one-step to enter with spouse and 3 children.  Currently, he is mod assist for mobility, min assist to power up to stand for transfers, and CGA with supervision for safety ambulating at a reduced speed without dynamic gait challenges at distance of 550 feet with walker. Therapy evaluations completed due to patient decreased functional mobility was admitted for a comprehensive rehab program.      Inpatient Rehabilitation Course: JUNO BOZARD was admitted to rehab 12/28/2024 for inpatient therapies to consist of PT, ST and OT at least three hours five days a week. Past admission physiatrist, therapy team and rehab RN have worked together to provide customized collaborative inpatient rehab.  Anticoagulation:   Pain Management:   Mood/Behavior/Sleep:   Dr. Corina Neuro Psychologist consulted for coping and adjustment issues in the setting of extended  hospital stay. No new medications or follow up advised.    Skin/Wound Care: Wound care/signs and symptoms of infection discussed at discharge.    Fluid/Nutrition/Electrolytes: Intake and output were monitored along with***.  The patient was maintained on a *** diet with *** supplementation. C  CHF instructions provided.    Hypertension:   (Orthostatic hypotension was monitored, and blood pressures remained controlled. Patient advised to get blood pressure cuff for monitoring at home. Patient reports access to BP cuff. Education for BP parameters discussed. The patient is scheduled to establish care/follow up with PCP upon discharge)   Hyperlipidemia:    T2DM: diabetes has been monitored with ac/hs CBG checks and SSI was use prn for tighter BS control. Diabetic teaching was completed.     Planned Outpatient Follow-Up:   -Cardiology/Coumadin  clinic -Ostomy clinic -PCP, establish care with Dr. Henry on 1/26 -PM&R     Rehab course: During patient's stay in rehab weekly team conferences were held to monitor patient's progress, set goals and  discuss barriers to discharge. At admission, patient required  mod assist for mobility, min assist to power up to stand for transfers, and CGA with supervision for safety ambulating at a reduced speed without dynamic gait challenges at distance of 550 feet with walker.   Occupational Therapy: Patient has met *** long term goals due to improved activity tolerance, improved balance, functional use of *** and ***, and improved coordination.  Patient to discharge at overall ***, with good understanding and adherence to precautions. Patient's care partner *** to provide the necessary physical assistance at discharge.  He/She will benefit from ongoing OT in *** setting to continue to advance functional skills in the area of BADL.   Physical Therapy: Patient has met *** long term goals due to improved activity tolerance, improved balance, improved postural  control, increased strength, decreased pain, ability to compensate for deficits, improved attention, improved awareness, and improved coordination.  Patient to discharge at an ***.   Patient's care partner *** to provide the necessary physical assistance at discharge.  He/She will benefit from ongoing skilled PT services in *** setting to continue to advance safe functional mobility, address ongoing impairments in strength, ROM, balance, endurance, gait, and minimize fall risk.    Discharge plan was discussed with patient and/or family member and they verbalized understanding and agreed with it.      Disposition:  Discharge disposition: 01-Home or Self Care        Diet: Regular   Special Instructions:  -No driving or operating heavy machinery until cleared by provider  -No smoking or alcohol or illicit drug use    Discharge Instructions     Amb Referral to Ostomy Clinic   Complete by: As directed    Reason for referral modifiers:  Education and instruction in ostomy irrigation techniques Information about lifestyle, intimacy, diet and clothing options Counseling, education and care for feeding/drainage tubes or fistulae Assistance with product selection     Ambulatory referral to Anticoagulation Monitoring   Complete by: As directed    Mr. Brassell is s/p bioprosthetic AVR / MVR by Dr. Lucas on 11/30/24.  He is now in the [facility] Inpatient Rehab unit with a discharge goal date of 01/05/25.  He will need follow up in the Coumadin  Clinic.   Thank you. mr   Responsible Group: [facility]   Ambulatory referral to Physical Medicine Rehab   Complete by: As directed       Allergies as of 01/04/2025       Reactions   Porcine (pork) Protein-containing Drug Products Other (See Comments)        Medication List     STOP taking these medications    acetaminophen  160 MG/5ML solution Commonly known as: TYLENOL    albuterol  (2.5 MG/3ML) 0.083% nebulizer  solution Commonly known as: PROVENTIL  Replaced by: albuterol  108 (90 Base) MCG/ACT inhaler   arformoterol  15 MCG/2ML Nebu Commonly known as: BROVANA    cefTRIAXone  1 g injection Commonly known as: ROCEPHIN    Digoxin  62.5 MCG Tabs   feeding supplement (PROSource TF20) liquid   feeding supplement (VITAL 1.5 CAL) Liqd   fiber supplement (BANATROL TF) liquid   free water  Soln   loperamide  HCl 1 MG/7.5ML suspension Commonly known as: IMODIUM  Replaced by: loperamide  2 MG capsule   losartan  25 MG tablet Commonly known as: COZAAR    morphine  (PF) 2 MG/ML injection   mouth rinse Liqd solution   pantoprazole  40 MG injection Commonly known as: PROTONIX  Replaced by: pantoprazole  40 MG tablet  polyethylene glycol 17 g packet Commonly known as: MIRALAX  / GLYCOLAX    Relizorb Devi device   revefenacin  175 MCG/3ML nebulizer solution Commonly known as: YUPELRI    simethicone  40 MG/0.6ML drops Commonly known as: MYLICON Replaced by: simethicone  80 MG chewable tablet   thiamine  100 MG tablet Commonly known as: Vitamin B-1       TAKE these medications    albuterol  108 (90 Base) MCG/ACT inhaler Commonly known as: VENTOLIN  HFA Inhale 2 puffs into the lungs every 4 (four) hours as needed for wheezing or shortness of breath (.). Replaces: albuterol  (2.5 MG/3ML) 0.083% nebulizer solution   amiodarone  200 MG tablet Commonly known as: PACERONE  Take 1 tablet (200 mg total) by mouth 2 (two) times daily. What changed: how to take this   colchicine  0.6 MG tablet Take 0.5 tablets (0.3 mg total) by mouth daily. What changed: how to take this   cyanocobalamin  1000 MCG/ML injection Commonly known as: VITAMIN B12 Inject 1 mL (1,000 mcg total) into the muscle once a week.   diphenoxylate -atropine  2.5-0.025 MG tablet Commonly known as: LOMOTIL  Take 1 tablet by mouth 4 (four) times daily. What changed: how to take this   empagliflozin  10 MG Tabs tablet Commonly known as:  JARDIANCE  Take 1 tablet (10 mg total) by mouth daily. Start taking on: January 05, 2025   fluticasone  furoate-vilanterol 100-25 MCG/ACT Aepb Commonly known as: BREO ELLIPTA  Inhale 1 puff into the lungs 2 (two) times daily.   folic acid  1 MG tablet Commonly known as: FOLVITE  Take 1 tablet (1 mg total) by mouth at bedtime.   lidocaine  5 % Commonly known as: LIDODERM  Place 1 patch onto the skin daily. Remove & Discard patch within 12 hours or as directed by MD   loperamide  2 MG capsule Commonly known as: IMODIUM  Take 2 capsules (4 mg total) by mouth every 6 (six) hours. Replaces: loperamide  HCl 1 MG/7.5ML suspension   metoprolol  succinate 25 MG 24 hr tablet Commonly known as: TOPROL -XL Take 0.5 tablets (12.5 mg total) by mouth daily. Start taking on: January 05, 2025   multivitamin with minerals Tabs tablet Take 1 tablet by mouth daily. Start taking on: January 05, 2025 What changed: how to take this   oxyCODONE  5 MG immediate release tablet Commonly known as: Oxy IR/ROXICODONE  Take 1 tablet (5 mg total) by mouth every 6 (six) hours as needed for moderate pain (pain score 4-6) or severe pain (pain score 7-10). What changed:  how much to take how to take this when to take this   pantoprazole  40 MG tablet Commonly known as: PROTONIX  Take 1 tablet (40 mg total) by mouth daily. Start taking on: January 05, 2025 Replaces: pantoprazole  40 MG injection   polycarbophil 625 MG tablet Commonly known as: FIBERCON Take 1 tablet (625 mg total) by mouth daily. Start taking on: January 05, 2025 What changed: how to take this   QUEtiapine  25 MG tablet Commonly known as: SEROQUEL  Take 1 tablet (25 mg total) by mouth at bedtime. What changed: how to take this   scopolamine  1 MG/3DAYS Commonly known as: TRANSDERM-SCOP Place 1 patch (1 mg total) onto the skin every 3 (three) days. Start taking on: January 07, 2025   simethicone  80 MG chewable tablet Commonly known as:  MYLICON Chew 1 tablet (80 mg total) by mouth 4 (four) times daily as needed for flatulence. Replaces: simethicone  40 MG/0.6ML drops   spironolactone  25 MG tablet Commonly known as: ALDACTONE  Take 0.5 tablets (12.5 mg total) by mouth  daily. Start taking on: January 05, 2025   thiamine  100 MG tablet Commonly known as: VITAMIN B1 Take 1 tablet (100 mg total) by mouth at bedtime. You may have different instructions for this medication elsewhere on this list. Ask your doctor how you should be taking this medication.   umeclidinium bromide  62.5 MCG/ACT Aepb Commonly known as: INCRUSE ELLIPTA  Inhale 1 puff into the lungs daily.   warfarin 5 MG tablet Commonly known as: COUMADIN  Take 1 tablet (5 mg total) by mouth daily at 4 PM.        Follow-up Information     Lucas Dorise POUR, MD. Go on 01/20/2025.   Specialty: Cardiothoracic Surgery Why: Your appointment is at 11:30am.  Please arrive 1 hour early for a chest x-ray to be performed on the second floor of the same building. Contact information: [address] [place] [address] [phone]         Zenaida Morene PARAS, MD Follow up.   Specialty: Cardiology Why: 11:40 AM Contact information: [address], [address] [place] [address] [phone]         Jerrell Cleatus Ned, MD Follow up.   Specialty: Internal Medicine Why: Appointment to establish care January 18, 2025 at 9:45 AM. Contact information: [address] [place] [address] [phone]         [facility] Follow up.   Specialty: General Surgery Contact information: [address] Entrance [place]  [address] [phone]        [facility] [address] Office Follow up.   Specialty: Cardiology Why: Follow for management of coumadin . Contact information: [address], [address] [place]  [address] [phone]        Carilyn Prentice BRAVO, MD  Follow up.   Specialty: Physical Medicine and Rehabilitation Why: Office will call for appointment Contact information: [address] [place] [address] [phone]         Ebbie Cough, MD Follow up.   Specialty: General Surgery Why: Call for an appointment to follow up for ileostomy. Contact information: [address] [place] [address] [phone]                 Signed: Daphne LOISE Satterfield 01/04/2025, 3:55 PM

## 2025-01-04 NOTE — Telephone Encounter (Signed)
 Pharmacy Patient Advocate Encounter  Received notification from [facility] MEDICAID that Prior Authorization for Jardiance  10MG  tablets has been APPROVED from 01/04/25 to 01/04/26   PA #/Case ID/Reference #: [id number]

## 2025-01-04 NOTE — Progress Notes (Signed)
 Occupational Therapy Session Note  Patient Details  Name: Alejandro Lopez MRN: [id number] Date of Birth: [birth year]  Today's Date: 01/04/2025 OT Individual Time: 9153-9068 OT Individual Time Calculation (min): 45 min  and Today's Date: 01/04/2025 OT Missed Time: 30 Minutes Missed Time Reason: Other (comment) (recieving breathing treatment at begining of session; pt requesting to finish session early to allow for wound care.)   Short Term Goals: Week 1:  OT Short Term Goal 1 (Week 1): STG=LTG d/t ELOS  Skilled Therapeutic Interventions/Progress Updates:     Pt received with RT present in room providing breathing treatment. Missed 15 min skilled OT treatment, will attempt to make up time as Pt's status and schedule allow. Pt presenting to be in good spirits receptive to skilled OT session reporting 0/10 pain- OT offering intermittent rest breaks, repositioning, and therapeutic support to optimize participation in therapy session. Pt receptive to taking shower and completing morning ADL routine. He transitioned to EOB MOD I. Pt requesting to use restroom and empty ostomy bag at beginning of session. Stood to use urinal MOD I. Pt reporting he had not been trained on how to empty ostomy bag, however per notes Pt has been practicing with primary OT. Assisted Pt with emptying ostomy bag this session with Pt able to verbalize each step and how to best ensure cleanliness. He completed ambulatory transfers throughout session using rollator MOD I without LOB, appropriate safety awareness. Doffed clothes while standing using grab bar for balance MOD I and sat on [facility] for entirety of shower for U/LB bathing. Following shower, Pt dried self MOD I and complete U/LB dressing while seated EOB MOD I +increased time. Pt requesting to return to bed until wound care team came for education on wound dressings. Informed RN of Pt's status. Pt declining all other therapeutic activities at this time (finished OT session 15  min early). Pt was left resting in bed with call bell in reach, bed alarm on, and all needs met.    Therapy Documentation Precautions:  Precautions Precautions: Fall, Sternal Precaution/Restrictions Comments: Feeding tube, wound vac, ostomy Restrictions Weight Bearing Restrictions Per Provider Order: Yes RUE Weight Bearing Per Provider Order: Non weight bearing LUE Weight Bearing Per Provider Order: Non weight bearing Other Position/Activity Restrictions: sternal precautions   Therapy/Group: Individual Therapy  Katheryn SHAUNNA [place] 01/04/2025, 9:09 AM

## 2025-01-04 NOTE — Progress Notes (Addendum)
 "    Advanced Heart Failure Rounding Note  Cardiologist: None  Chief Complaint: Valvular Heart Disease & HFrEF Patient Profile   44 y.o. male w/ h/o heavy alcohol use (at least 5 drinks per evening) and h/o asthma admitted w/ acute systolic heart failure. Strong family history of cardiac disease: mother and maternal grandmother had heart failure and father with CAD. Admitted with acute HFrEF/cardiogenic shock. Echo w/ severe AR, MR and LV dysfunction.    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  12/9 VT/VF ? vagal episode. Extubated 12/11: S/p R thoracentesis with resultant hemothorax.  Chest tube placed. S/p R intercostal artery coil (IR). S/p exp lap for ischemic bowel resection, bowel left in discontinuity 12/15: Back to OR for ischemic R colon and mesenteric ischemia. S/p exp/lap, wound vac exchange, R colectomy without anastomosis.  12/16 Back to OR for small bowel resection and ileostomy  12/18 OR for VATS/hemothorax washout, pericardial window was not done.  12/19 extubated 12/21 Reintubated. Ileus 12/23 S/p Trach. Fever post procedure, Tmax 101. Abx switched to cefepime    12/24 Impella removed 12/25: DBA stopped. Diuretics held d/t low CVPs 12/26: Chest tube/foley out 12/31: Started 7 day course of ceftriaxone  for fevers  1/3: Bleeding from ostomy output, heparin  gtt paused  12/28/24: Trach capped and decannulated. Tx to CIR.   Subjective:    Feels he is making progress w/ therapy.   SBPs remain soft, low 100s but no orthostatic symptoms. Progressing diet, eating about 50% of his meals, per his report.   Scr/K ok.   Denies CP. No exertional dyspnea.    Objective:    Weight Range: 63 kg Body mass index is 19.93 kg/m.   Vital Signs:   Temp:  [97.5 F (36.4 C)-98.4 F (36.9 C)] 97.5 F (36.4 C) (01/12 0652) Pulse Rate:  [89-95] 89 (01/12 0652) Resp:  [17-19] 19 (01/12 0652) BP: (91-108)/(64-73) 106/73 (01/12 0652) SpO2:   [96 %-100 %] 96 % (01/12 0839) Weight:  [63 kg] 63 kg (01/12 0653) Last BM Date : 01/03/25  Weight change: Filed Weights   01/02/25 0406 01/03/25 0524 01/04/25 0653  Weight: 65.4 kg 63.6 kg 63 kg   Intake/Output:  Intake/Output Summary (Last 24 hours) at 01/04/2025 0934 Last data filed at 01/04/2025 0900 Gross per 24 hour  Intake 240 ml  Output 975 ml  Net -735 ml    Physical Exam   GENERAL: fatigued appearing, NAD Lungs- clear  CARDIAC:  JVP not elevated          Normal rate with regular rhythm. No MRG. No LEE  ABDOMEN: Soft, non-tender, non-distended. + ostomy  EXTREMITIES: Warm and well perfused.  NEUROLOGIC: No obvious FND  Labs    CBC Recent Labs    01/04/25 0623  WBC 10.3  HGB 8.2*  HCT 25.0*  MCV 89.9  PLT 442*   Basic Metabolic Panel Recent Labs    98/87/73 0623  NA 130*  K 3.9  CL 96*  CO2 23  GLUCOSE 90  BUN 9  CREATININE 0.64  CALCIUM  9.2   Liver Function Tests No results for input(s): AST, ALT, ALKPHOS, BILITOT, PROT, ALBUMIN  in the last 72 hours.   ProBNP (last 3 results) Recent Labs    11/22/24 1958 12/29/24 1445  PROBNP 3,354.0* 282.0   Fasting Lipid Panel No results for input(s): CHOL, HDL, LDLCALC, TRIG, CHOLHDL, LDLDIRECT in the last 72 hours.  Medications:    Scheduled Medications:  (feeding  supplement) PROSource Plus  30 mL Oral BID BM   amiodarone   200 mg Oral BID   arformoterol   15 mcg Nebulization BID   colchicine   0.3 mg Oral Daily   cyanocobalamin   1,000 mcg Intramuscular Weekly   diphenoxylate -atropine   1 tablet Oral QID   empagliflozin   10 mg Oral Daily   folic acid   1 mg Oral QHS   hydrocerin   Topical BID   lidocaine   1 patch Transdermal Q24H   loperamide   4 mg Oral Q6H   metoprolol  succinate  12.5 mg Oral Daily   multivitamin with minerals  1 tablet Oral Daily   mouth rinse  15 mL Mouth Rinse 4 times per day   pantoprazole   40 mg Oral Daily   polycarbophil  625 mg Oral Daily    QUEtiapine   25 mg Oral QHS   revefenacin   175 mcg Nebulization Daily   scopolamine   1 patch Transdermal Q72H   thiamine   100 mg Oral QHS   Warfarin - Pharmacist Dosing Inpatient   Does not apply q1600    Infusions:    PRN Medications: acetaminophen  (TYLENOL ) oral liquid 160 mg/5 mL, albuterol , alum & mag hydroxide-simeth, bisacodyl , diphenhydrAMINE , guaiFENesin -dextromethorphan, ondansetron  (ZOFRAN ) IV, oxyCODONE , polyethylene glycol, simethicone , sodium chloride , sodium phosphate , traZODone   Assessment/Plan   Valvular Disease  S/P AVR/MVR  - severe MR posteriorly directed, mod TR. Likely functional.  - CMR - Severe AR/MR  - S/P bioprosthetic AVR/MVR 11/30/24 - Continue warfarin. INR 2.3 today   Acute hypoxic/hypercarbic respiratory failure - reintubated 12/21 due to hypercarbia   - s/p Trach 12/23 - Track capped and decannulated 12/28/24.   Acute Systolic Heart Failure w/ Biventricular Dysfunction >> Cardiogenic Shock - HS trop not c/w ACS but EKG w/ anteroseptal Qs  - CMRI Severe AR/MR. LVEF 35%  - Cath- normal cors, RA5, PA 32/17 (24), PVR 2.57 Papi 3. CO 4.9 CI 3.8 - Impella 5.5 Bridge to AVR/MVR  - Post-op Echo 12/02/24: EF < 20% with severe LVH (new, ?inflammatory), moderately decreased RV function, stable bioprosthetic MV and AoV.  - Impella removed 12/24  - Ltd echo 12/28: EF 40-45%, mildly reduced RV - Ltd echo 12/31/24: EF 30%, large pericardial effusion w/ no evidence of tamponade - Euvolemic on exam. Does not need loop diuretic currently  - Off spiro d/t hyperkalemia. Of note, he had been on banatrol which has potassium in it, now off since 1/7. Will retrial spiro 12.5 mg daily - Off losartan  per Nephrology for soft BP. BPs remains soft, continue to hold  - Stopped digoxin  with elevated level on low dose - Continue metoprolol  xl 12.5 mg daily - Continue Jardiance  10 mg daily  Ischemic bowel - S/p exp/lap 12/11. Patchy ischemia of the distal ileum extending over  about 75 cm without perforation. Ischemic segment resected.  Colon was distended with gas but viable. Small bowel left in discontinuity.  - Back to OR 12/15 for ischemic R colon and mesenteric ischemia. S/p exp/lap, wound vac exchange, R colectomy without anastomosis.  - Returned to OR 12/16 for ileostomy creation.  - GSU following. Ileostomy working well - TPN and TF now off. CorTrak removed  R hemothorax: resolved - Hemothorax s/p right thoracentesis with intercostal artery injury. Massive bleeding requiring multiple products and development of hemorrhagic shock, now improved. S/p IR embolization.  - S/p VATS 12/18 with right chest washout.  - H/H stable. CT out.  ETOH Abuse - heavy drinker, at least 5 drinks per evening - may be  contributing to CM. Did not withdrawal - cessation needed after discharge   DMII  - Hgb A1C 6.1 - Continue Jardiance    AKI - Resolved.  VT - Quiescent - continue amio 200 mg bid  Pericardial effusion - Limited echo 12/17 with moderate effusion.  - This was not drained at time of VATS/right hemothorax washout on 12/18. Appeared fibrinous. - Ltd echo 12/28 with increased, large pericardial effusion, no tamponade - Repeat limited echo 1/2 was stable - Limited echo 1/8 w/ large effusion, no tamponade - CRP 21, ESR 138 - Continue colchicine  x 3 months   Atrial flutter - Prev in/out of AFL with controlled rate.  - RRR on exam. Continue amio 200 mg bid - Warfarin started 12/30, INR 2.3 today  Anemia -Hgb stable 8.2  -T sat 17 but ferritin high -Hold off on IV iron as he's received multiple transfusions  Length of Stay: [address] PA-C  01/04/2025, 9:34 AM  Advanced Heart Failure Team Pager [phone] (M-F; 7a - 5p)   Please visit [email or web address]: For overnight coverage please call cardiology fellow first. If fellow not available call Shock/ECMO MD on call.  For ECMO / Mechanical Support (Impella, IABP, LVAD) issues call Shock / ECMO MD  on call.   Patient seen with PA, I formulated the plan and agree with the above note.   No complaints today.  K normal.    General: NAD Neck: No JVD, no thyromegaly or thyroid nodule.  Lungs: Clear to auscultation bilaterally with normal respiratory effort. CV: Nondisplaced PMI.  Heart regular S1/S2, no S3/S4, no murmur.  No peripheral edema.   Abdomen: Soft, nontender, no hepatosplenomegaly, no distention.  Skin: Intact without lesions or rashes.  Neurologic: Alert and oriented x 3.  Psych: Normal affect. Extremities: No clubbing or cyanosis.  HEENT: Normal.   SBP 100s, can add back spironolactone  12.5 daily and follow K.  Continue Toprol  XL and Jardiance .  No diuretic needed at this time.   Will need echo again in a couple of weeks to reassess pericardial effusion.   Ezra Shuck 01/04/2025 10:24 AM  "

## 2025-01-04 NOTE — Telephone Encounter (Signed)
 Pharmacy Patient Advocate Encounter  Received notification from [facility] MEDICAID that Prior Authorization for QUEtiapine  Fumarate 25MG  tablets has been APPROVED from 01/04/25 to 01/04/26   PA #/Case ID/Reference #: [id number]

## 2025-01-04 NOTE — Progress Notes (Signed)
 Occupational Therapy Discharge Summary  Patient Details  Name: Alejandro Lopez MRN: [id number] Date of Birth: [birth year]  Date of Discharge from OT service:January 04, 2025    Patient has met 9 of 9 long term goals due to improved activity tolerance, improved balance, and ability to compensate for deficits.  Pt's overall activity tolerance has improved well. He is able to ambulate with a 4WW over 1000 ft and heart rate has not gone over 100 bpm.   Patient to discharge at overall Modified Independent level.  Pt has also learned how to empty ostomy bag at toilet and engage in wound care treatment of his abdomen to assist his wife in the process. Patient's care partner is independent to provide the necessary physical assistance at discharge.    Reasons goals not met: n/a  Recommendation:  No further OT services needed at this time.  Equipment: Shower seat  Reasons for discharge: treatment goals met  Patient/family agrees with progress made and goals achieved: Yes  OT Discharge Precautions/Restrictions  Precautions Precautions: Fall;Sternal Precaution/Restrictions Comments: ostomy, abdominal wound Restrictions Other Position/Activity Restrictions: sternal precautions  ADL ADL Eating: Independent Grooming: Independent Where Assessed-Grooming: Standing at sink Upper Body Bathing: Independent Where Assessed-Upper Body Bathing: Shower Lower Body Bathing: Modified independent Where Assessed-Lower Body Bathing: Shower Upper Body Dressing: Independent Where Assessed-Upper Body Dressing: Edge of bed Lower Body Dressing: Modified independent Where Assessed-Lower Body Dressing: Edge of bed Toileting: Modified independent Where Assessed-Toileting: Teacher, Adult Education: Engineer, Agricultural Method: Insurance Claims Handler: Modified independent Web Designer Method: Ship Broker: Information systems manager with back Film/video Editor:  Modified independent Film/video Editor Method: Designer, Industrial/product: Emergency planning/management officer, Grab bars Vision Baseline Vision/History: 0 No visual deficits Patient Visual Report: No change from baseline Vision Assessment?: Wears glasses for reading Perception  Perception: Within Functional Limits Praxis Praxis: WFL Cognition Cognition Overall Cognitive Status: Within Functional Limits for tasks assessed Safety/Judgment: Appears intact Brief Interview for Mental Status (BIMS) Repetition of Three Words (First Attempt): 3 Temporal Orientation: Year: Correct Temporal Orientation: Month: Accurate within 5 days Temporal Orientation: Day: Correct Recall: Sock: Yes, no cue required Recall: Blue: Yes, no cue required Recall: Bed: Yes, no cue required BIMS Summary Score: 15 Sensation Sensation Light Touch: Appears Intact Hot/Cold: Appears Intact Proprioception: Appears Intact Stereognosis: Appears Intact Coordination Gross Motor Movements are Fluid and Coordinated: Yes Fine Motor Movements are Fluid and Coordinated: Yes Motor  Motor Motor - Discharge Observations: generalized weakness Mobility    Mod ind with 4WW Trunk/Postural Assessment  Postural Control Postural Control: Within Functional Limits  Balance Static Sitting Balance Static Sitting - Level of Assistance: 7: Independent Dynamic Sitting Balance Dynamic Sitting - Level of Assistance: 7: Independent Static Standing Balance Static Standing - Level of Assistance: 7: Independent Dynamic Standing Balance Dynamic Standing - Level of Assistance: 6: Modified independent (Device/Increase time) Dynamic Standing - Comments: decreased dynamic balance due to generalized weakness Extremity/Trunk Assessment RUE Assessment Active Range of Motion (AROM) Comments: WFL General Strength Comments: limited d/t precautions proximal, distally WNL LUE Assessment Active Range of Motion (AROM) Comments:  WFL General Strength Comments: limited d/t precautions proximal, distally WNL   Tyde Lamison 01/04/2025, 12:48 PM

## 2025-01-04 NOTE — Progress Notes (Signed)
 Inpatient Rehabilitation Discharge Medication Review by a Pharmacist  A complete drug regimen review was completed for this patient to identify any potential clinically significant medication issues.  High Risk Drug Classes Is patient taking? Indication by Medication  Antipsychotic Yes Quetiapine  - agitation  Anticoagulant Yes Warfarin - MVR  Antibiotic No   Opioid Yes Oxycodone  prn pain  Antiplatelet No   Hypoglycemics/insulin  Yes Empagliflozin  - DM, CHF  Vasoactive Medication Yes Amiodarone  - Afib Metoprolol  - Afib, HTN, CHF  Chemotherapy No   Other Yes Colchicine  - pericardial effuson Lomotil , loperamide  prn diarrhea Pantoprazole  - reflux  Scopolamine  - secretions Folic acid , thiamine - alcohol Albuterol , Incurse, Breo - COPD     Type of Medication Issue Identified Description of Issue Recommendation(s)  Drug Interaction(s) (clinically significant)     Duplicate Therapy     Allergy     No Medication Administration End Date     Incorrect Dose     Additional Drug Therapy Needed     Significant med changes from prior encounter (inform family/care partners about these prior to discharge).    Other       Clinically significant medication issues were identified that warrant physician communication and completion of prescribed/recommended actions by midnight of the next day:  No  Name of provider notified for urgent issues identified:   Provider Method of Notification:     Pharmacist comments: None  Time spent performing this drug regimen review (minutes):  30 minutes  Thank you. Olam Monte, PharmD

## 2025-01-05 ENCOUNTER — Telehealth (HOSPITAL_COMMUNITY): Payer: Self-pay

## 2025-01-05 ENCOUNTER — Other Ambulatory Visit (HOSPITAL_COMMUNITY): Payer: Self-pay

## 2025-01-05 DIAGNOSIS — Z951 Presence of aortocoronary bypass graft: Secondary | ICD-10-CM

## 2025-01-05 LAB — PROTIME-INR
INR: 2.8 — ABNORMAL HIGH (ref 0.8–1.2)
Prothrombin Time: 30.6 s — ABNORMAL HIGH (ref 11.4–15.2)

## 2025-01-05 MED ORDER — WARFARIN SODIUM 2.5 MG PO TABS
2.5000 mg | ORAL_TABLET | ORAL | Status: AC
  Administered 2025-01-05: 2.5 mg via ORAL
  Filled 2025-01-05: qty 1

## 2025-01-05 MED ORDER — BUDESONIDE-FORMOTEROL FUMARATE 160-4.5 MCG/ACT IN AERO
2.0000 | INHALATION_SPRAY | Freq: Two times a day (BID) | RESPIRATORY_TRACT | 12 refills | Status: AC
  Filled 2025-01-05: qty 10.2, 30d supply, fill #0

## 2025-01-05 NOTE — Progress Notes (Signed)
 Inpatient Rehabilitation Care Coordinator Discharge Note   Patient Details  Name: Alejandro Lopez MRN: [id number] Date of Birth: 22-Jul-1981   Discharge location: HOME WITH WIFE AND CHILDREN WILL HAVE 24/7 SUPERVISION  Length of Stay: 8 DAYS  Discharge activity level: MOD/I LEVEL  Home/community participation: ACTIVE  Patient response un:Yzjouy Literacy - How often do you need to have someone help you when you read instructions, pamphlets, or other written material from your doctor or pharmacy?: Never  Patient response un:Dnrpjo Isolation - How often do you feel lonely or isolated from those around you?: Never  Services provided included: MD, RD, PT, OT, SLP, RN, CM, TR, Pharmacy, Neuropsych, SW  Financial Services:  Field Seismologist Utilized: Private Insurance [facility] MEDICAID  Choices offered to/list presented to: PT AND WIFE  Follow-up services arranged:  Outpatient, DME, Patient/Family has no preference for HH/DME agencies    Outpatient Servicies: [facility] CARDIOLOGIST TO SET UP AT [facility] DME : [facility] SEAT AND ROLLATOR  STD FORMS COMPLETED AND GIVEN BACK TO WIFE FOR HER RECORDS  Patient response to transportation need: Is the patient able to respond to transportation needs?: Yes In the past 12 months, has lack of transportation kept you from medical appointments or from getting medications?: No In the past 12 months, has lack of transportation kept you from meetings, work, or from getting things needed for daily living?: No   Patient/Family verbalized understanding of follow-up arrangements:  Yes  Individual responsible for coordination of the follow-up plan: [name] [phone]  Confirmed correct DME delivered: Alejandro Lopez MATSU 01/05/2025    Comments (or additional information):WIFE WAS HERE AND EDUCATED ON WOUND CARE AND ILEOSTOMY ALONG WITH PT. AWARE NO HOME HEALTH WOULD ACCEPT HIS INSURANCE AND ACTUALLY PROGRESSED TO OP  LEVEL. SUPPLIES GIVEN TO THEM FOR ONE WEEK COVERAGE UNTIL OTHERS RECEIVE IN MAIL AT HOME.   Summary of Stay    Date/Time Discharge Planning CSW  12/30/24 [phone] Home with wife and children will have 24/7 care, wife will need to learn wound care and ileostomy care. Will try to find home health agency to accept his referral [name]       Alejandro Lopez

## 2025-01-05 NOTE — Progress Notes (Addendum)
 "                                                        PROGRESS NOTE   Subjective/Complaints:  Feels comfortable with ileostomy bag changes   ROS: Patient denies SOB, N/V/D Objective:   No results found.  Recent Labs    01/04/25 0623  WBC 10.3  HGB 8.2*  HCT 25.0*  PLT 442*    Recent Labs    01/04/25 0623  NA 130*  K 3.9  CL 96*  CO2 23  GLUCOSE 90  BUN 9  CREATININE 0.64  CALCIUM  9.2    Intake/Output Summary (Last 24 hours) at 01/05/2025 0759 Last data filed at 01/05/2025 0520 Gross per 24 hour  Intake 480 ml  Output 600 ml  Net -120 ml         Physical Exam: Vital Signs Blood pressure 110/72, pulse 80, temperature 98.4 F (36.9 C), resp. rate 17, height 5' 10 (1.778 m), weight 64 kg, SpO2 99%.   General: No acute distress Mood and affect are appropriate Heart: Regular rate and rhythm no rubs murmurs or extra sounds Lungs: Clear to auscultation, breathing unlabored, no rales or wheezes Abdomen: Positive bowel sounds, soft nontender to palpation, nondistended Extremities: No clubbing, cyanosis, or edema Skin: No evidence of breakdown, no evidence of rash   Skin: No evidence of breakdown, no evidence of rash. Abdominal incision healing Neurologic: Cranial nerves II through XII intact, motor strength is 4/5 in bilateral deltoid, bicep, tricep, grip, hip flexor, knee extensors, ankle dorsiflexor and plantar flexor   Musculoskeletal: Full range of motion in all 4 extremities. No joint swelling   Assessment/Plan: 1. Functional deficits debility , due to sepsis Stable for D/C today F/u cards Dr Zenaida 2/2 F/u CVTS Dr Lucas 1/28 F/u Gen Surg Dr Ebbie per his office F/u warfarin clinic F/u Ostomy clinic  See D/C summary See D/C instructions  Care Tool:  Bathing    Body parts bathed by patient: Right arm, Left arm, Chest, Abdomen, Front perineal area, Buttocks, Face, Left lower leg, Right lower leg, Left upper leg, Right upper leg          Bathing assist Assist Level: Independent with assistive device     Upper Body Dressing/Undressing Upper body dressing   What is the patient wearing?: Pull over shirt    Upper body assist Assist Level: Independent    Lower Body Dressing/Undressing Lower body dressing      What is the patient wearing?: Pants     Lower body assist Assist for lower body dressing: Independent     Toileting Toileting    Toileting assist Assist for toileting: Independent     Transfers Chair/bed transfer  Transfers assist     Chair/bed transfer assist level: Independent with assistive device Chair/bed transfer assistive device:  (rollator)   Locomotion Ambulation   Ambulation assist      Assist level: Supervision/Verbal cueing Assistive device: Rollator Max distance: 516 ft   Walk 10 feet activity   Assist     Assist level: Independent with assistive device Assistive device: Rollator   Walk 50 feet activity   Assist    Assist level: Independent with assistive device Assistive device: Rollator    Walk 150 feet activity   Assist    Assist level: Supervision/Verbal  cueing Assistive device: Rollator    Walk 10 feet on uneven surface  activity   Assist     Assist level: Supervision/Verbal cueing Assistive device: Rollator   Wheelchair     Assist Is the patient using a wheelchair?: No (will not use after d/c home - also has sternal precautions limiting ability to push) Type of Wheelchair: Manual Wheelchair activity did not occur: Refused  Wheelchair assist level: Dependent - Patient 0%      Wheelchair 50 feet with 2 turns activity    Assist    Wheelchair 50 feet with 2 turns activity did not occur: Refused   Assist Level: Dependent - Patient 0%   Wheelchair 150 feet activity     Assist  Wheelchair 150 feet activity did not occur: Refused   Assist Level: Dependent - Patient 0%   Blood pressure 110/72, pulse 80, temperature  98.4 F (36.9 C), resp. rate 17, height 5' 10 (1.778 m), weight 64 kg, SpO2 99%.  Medical Problem List and Plan: 1. Functional deficits secondary to debility after septic shock, ischemic bowel and associated medical and surgical course             -patient may shower             -ELOS/Goals:1/13 mod I to supervision with PT, OT, SLP  --Continue CIR therapies including PT, OT, and SLP  No PMR f/u needed 2.  Antithrombotics: -DVT/anticoagulation:  Mechanical: Sequential compression devices, below knee Bilateral lower extremities Pharmaceutical: Coumadin              -antiplatelet therapy: N/A   3. Pain Management: Lidoderm  patch.  As needed: Tylenol  and oxycodone .    1/11 myofasical shoulder girdle pain   -TPI's 1/9 with benefit COnt PT , will need HEP for this as well  4. Mood/Behavior/Sleep: LCSW to follow for evaluation and support when available.              - Agitation resolved:  Seroquel  25 mg nightly--d/c   5. Neuropsych/cognition: This patient is capable of making decisions on his own behalf.   6. Skin/Wound Care: Routine pressure relief measures.  WOC consulted for ongoing management of ostomy and abdominal wound.  Monitor sites for infection.  Midline abd wound looks great , may reduce Wet to dry dressing to 1 x per day   F/u WOC F/u Gen SUrg   7. Fluids/Electrolytes/Nutrition: Monitor strict I&O and daily weights.  Cortrak d/ed -Continue PPI   Dietary consult appreciated  8. Acute biventricular HFrEF with cardiogenic shock status post:  -warfarin for 3 months, INR 2.3- f/u warfarin clinic -GDMT - dig, spiro, losartan .  -con't amiodarone  200mg  BID              -con't colchine for pericardial effusion  Appreciate cards f/u - has appt with Dr Zenaida 01/25/25- they will give order for cardiac rehab    9. Acute respiratory failure with hypoxia and hypercapnia status post tracheostomy:  -Trach capped x 3 days. Decannulated  -stoma healing.  -con't Yupelri  and Brovan nebs.  Encourage insp spiro.    10.Sepsis with septic shock due to recurrent aspiration pneumonia -Enterobacter:              -complete 7 days of ceftriaxone ; last day 12/29/24    11. Ischemic bowel s/p laparotomy with partial ileum resection end-ileostomy:  -con't fiber, banatrol, imodium , loperamide  to assist with transit and stool consistency; meeting goal for ostomy output ~1L -con't relizorb to help with lipid  absorption- 1 cartridge during the day, 2 at night for higher TF flow rates. Anticipate he would benefit from Creon for short gut once off TF.   F/u Dr Ebbie Gen Surg   12. Anemia of critical illness and previous ABLA from hemothorax: Monitor CBC, transfuse Hb<7       Latest Ref Rng & Units 01/04/2025    6:23 AM 12/31/2024    5:22 AM 12/29/2024    5:38 AM  CBC  WBC 4.0 - 10.5 K/uL 10.3  12.8  11.2   Hemoglobin 13.0 - 17.0 g/dL 8.2  8.9  8.2   Hematocrit 39.0 - 52.0 % 25.0  26.7  24.5   Platelets 150 - 400 K/uL 442  413  421    Stable 1/12 13. Hyponatremia: Na 130         Latest Ref Rng & Units 01/04/2025    6:23 AM 01/01/2025    5:51 AM 12/31/2024    5:22 AM  BMP  Glucose 70 - 99 mg/dL 90  885  97   BUN 6 - 20 mg/dL 9  14  22    Creatinine 0.61 - 1.24 mg/dL 9.35  9.37  9.34   Sodium 135 - 145 mmol/L 130  130  126   Potassium 3.5 - 5.1 mmol/L 3.9  4.3  4.8   Chloride 98 - 111 mmol/L 96  98  94   CO2 22 - 32 mmol/L 23  21  21    Calcium  8.9 - 10.3 mg/dL 9.2  9.0  9.8   Hyper K+ resolved Na+ mildly reduced but stable   14.  Persistent leukocytosis without fever, Improved   1/11 CT surgery saw 1/9. No changes in plan   15.  Valvular heart disease s/p AVR and MVR- f/u Dr Lucas 1/28 LOS: 8 days A FACE TO FACE EVALUATION WAS PERFORMED  Alejandro Lopez 01/05/2025, 7:59 AM     "

## 2025-01-05 NOTE — Telephone Encounter (Signed)
 CARDIAC REHAB PHASE I   Post O[facility] education provided to patients spouse Dena. Education included site care, restrictions, heart healthy diabetic diet, sternal precautions, IS use at home, home needs at discharge, exercise guidelines as tolerated, and CRP2 reviewed. All questions and concerns addressed. Will refer patient to [facility] for CRP2.    Isaiah JAYSON Liverpool, RN BSN 01/05/2025 10:31 AM

## 2025-01-05 NOTE — Progress Notes (Signed)
 ANTICOAGULATION CONSULT NOTE  Pharmacy Consult for warfarin Indication: bA/MVR, new AFL  Allergies[1]  Patient Measurements: Height: 5' 10 (177.8 cm) Weight: 64 kg (141 lb 1.5 oz) IBW/kg (Calculated) : 73 Heparin  Dosing Weight: 70 kg   Vital Signs: Temp: 98.4 F (36.9 C) (01/13 0615) BP: 110/72 (01/13 0615) Pulse Rate: 80 (01/13 0615)  Labs: Recent Labs    01/03/25 0605 01/04/25 0623 01/05/25 0512  HGB  --  8.2*  --   HCT  --  25.0*  --   PLT  --  442*  --   LABPROT 24.8* 26.3* 30.6*  INR 2.1* 2.3* 2.8*  CREATININE  --  0.64  --     Estimated Creatinine Clearance: 105.6 mL/min (by C-G formula based on SCr of 0.64 mg/dL).  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  Postop course complicated by ischemic bowel s/p exlap 12/11 with partial small bowel resection, abdomen left open.  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 Impella 5.5 removed 12/24.  Was on TPN 12/12>>12/29.   Per Dr. Lucas, plan for 3 months of OAC with dual valve replacement - Warfarin started 12/22/24. Not on anticoagulation prior to admission.   INR jumped to 2.8 today - likely more sensitive with variable PO intake the past few days and cessation of TF's (was requiring high doses).  Has been eating nutritional supplements twice daily, continues on amiodarone  BID.  Patient excited to go home and eat a big steak and cheese sandwich - endorses only eating two meals at home (skips breakfast), so will be more conservative with dose today and he has close follow-up.   Goal of Therapy:  INR 2-3 Monitor platelets by anticoagulation protocol: Yes   Plan:  Give Warfarin 2.5mg  x1 PO today before discharge, then 5 mg daily F/u in CVRR INR clinic 1/15 @14 :30 Patient educated on dosing and DDI's 1/13 - aware of F/U appointment  Maurilio Fila, PharmD Clinical  Pharmacist 01/05/2025  9:29 AM      [1]  Allergies Allergen Reactions   Porcine (Pork) Protein-Containing Drug Products Other (See Comments)

## 2025-01-05 NOTE — Plan of Care (Signed)
" °  Problem: RH Balance Goal: LTG Patient will maintain dynamic standing balance (PT) Description: LTG:  Patient will maintain dynamic standing balance with assistance during mobility activities (PT) Outcome: Completed/Met Flowsheets (Taken 12/29/2024 1431 by Levora Comer HERO, PT) LTG: Pt will maintain dynamic standing balance during mobility activities with:: Independent with assistive device    Problem: Sit to Stand Goal: LTG:  Patient will perform sit to stand with assistance level (PT) Description: LTG:  Patient will perform sit to stand with assistance level (PT) Outcome: Completed/Met Flowsheets (Taken 01/05/2025 0442) LTG: PT will perform sit to stand in preparation for functional mobility with assistance level: Independent   Problem: RH Bed Mobility Goal: LTG Patient will perform bed mobility with assist (PT) Description: LTG: Patient will perform bed mobility with assistance, with/without cues (PT). Outcome: Completed/Met Flowsheets (Taken 12/29/2024 1431 by Levora Comer HERO, PT) LTG: Pt will perform bed mobility with assistance level of: Independent with assistive device    Problem: RH Bed to Chair Transfers Goal: LTG Patient will perform bed/chair transfers w/assist (PT) Description: LTG: Patient will perform bed to chair transfers with assistance (PT). Outcome: Completed/Met Flowsheets (Taken 12/29/2024 1431 by Levora Comer HERO, PT) LTG: Pt will perform Bed to Chair Transfers with assistance level: Independent with assistive device    Problem: RH Car Transfers Goal: LTG Patient will perform car transfers with assist (PT) Description: LTG: Patient will perform car transfers with assistance (PT). Outcome: Completed/Met Flowsheets (Taken 12/29/2024 1431 by Levora Comer HERO, PT) LTG: Pt will perform car transfers with assist:: Set up assist    Problem: RH Furniture Transfers Goal: LTG Patient will perform furniture transfers w/assist (OT/PT) Description: LTG: Patient  will perform furniture transfers  with assistance (OT/PT). Outcome: Completed/Met Flowsheets (Taken 12/29/2024 1431 by Levora Comer HERO, PT) LTG: Pt will perform furniture transfers with assist:: Independent with assistive device    Problem: RH Ambulation Goal: LTG Patient will ambulate in controlled environment (PT) Description: LTG: Patient will ambulate in a controlled environment, # of feet with assistance (PT). Outcome: Completed/Met Flowsheets (Taken 12/29/2024 1431 by Levora Comer HERO, PT) LTG: Pt will ambulate in controlled environ  assist needed:: Independent with assistive device LTG: Ambulation distance in controlled environment: 150 ft using LRAD Goal: LTG Patient will ambulate in home environment (PT) Description: LTG: Patient will ambulate in home environment, # of feet with assistance (PT). Outcome: Completed/Met Flowsheets (Taken 12/29/2024 1431 by Levora Comer HERO, PT) LTG: Pt will ambulate in home environ  assist needed:: Independent with assistive device LTG: Ambulation distance in home environment: 50 ft using LRAD   Problem: RH Stairs Goal: LTG Patient will ambulate up and down stairs w/assist (PT) Description: LTG: Patient will ambulate up and down # of stairs with assistance (PT) Outcome: Completed/Met Flowsheets (Taken 12/29/2024 1431 by Levora Comer HERO, PT) LTG: Pt will ambulate up/down stairs assist needed:: Supervision/Verbal cueing LTG: Pt will  ambulate up and down number of stairs: per home set up with LRAD   "

## 2025-01-07 ENCOUNTER — Inpatient Hospital Stay (INDEPENDENT_AMBULATORY_CARE_PROVIDER_SITE_OTHER): Admitting: *Deleted

## 2025-01-07 ENCOUNTER — Other Ambulatory Visit: Payer: Self-pay

## 2025-01-07 DIAGNOSIS — Z952 Presence of prosthetic heart valve: Secondary | ICD-10-CM

## 2025-01-07 DIAGNOSIS — Z5181 Encounter for therapeutic drug level monitoring: Secondary | ICD-10-CM

## 2025-01-07 LAB — POCT INR: POC INR: 4.2

## 2025-01-07 NOTE — Patient Instructions (Addendum)
 Description   Hold warfarin today and then START taking warfarin 1 tablet daily except for 1/2 a tablet on Wednesday and Friday. Recheck INR in 1 week. Coumadin  Clinic [phone] A full discussion of the nature of anticoagulants has been carried out.  A benefit risk analysis has been presented to the patient, so that they understand the justification for choosing anticoagulation at this time. The need for frequent and regular monitoring, precise dosage adjustment and compliance is stressed.  Side effects of potential bleeding are discussed.  The patient should avoid any OTC items containing aspirin  or ibuprofen , and should avoid great swings in general diet.  Avoid alcohol consumption.  Call if any signs of abnormal bleeding

## 2025-01-07 NOTE — Progress Notes (Signed)
 Lab Results  Component Value Date   INR 4.2 01/07/2025   INR 2.8 (H) 01/05/2025   INR 2.3 (H) 01/04/2025   Description   Hold warfarin today and then START taking warfarin 1 tablet daily except for 1/2 a tablet on Wednesday and Friday. Recheck INR in 1 week. Coumadin  Clinic [phone] A full discussion of the nature of anticoagulants has been carried out.  A benefit risk analysis has been presented to the patient, so that they understand the justification for choosing anticoagulation at this time. The need for frequent and regular monitoring, precise dosage adjustment and compliance is stressed.  Side effects of potential bleeding are discussed.  The patient should avoid any OTC items containing aspirin  or ibuprofen , and should avoid great swings in general diet.  Avoid alcohol consumption.  Call if any signs of abnormal bleeding

## 2025-01-08 ENCOUNTER — Other Ambulatory Visit (HOSPITAL_COMMUNITY): Payer: Self-pay

## 2025-01-12 ENCOUNTER — Ambulatory Visit (HOSPITAL_COMMUNITY)
Admission: RE | Admit: 2025-01-12 | Discharge: 2025-01-12 | Disposition: A | Discharge status: Home / Self Care | Source: Ambulatory Visit | Attending: *Deleted | Admitting: *Deleted

## 2025-01-12 DIAGNOSIS — Z432 Encounter for attention to ileostomy: Secondary | ICD-10-CM | POA: Insufficient documentation

## 2025-01-12 DIAGNOSIS — Z9049 Acquired absence of other specified parts of digestive tract: Secondary | ICD-10-CM | POA: Diagnosis not present

## 2025-01-12 DIAGNOSIS — L24B3 Irritant contact dermatitis related to fecal or urinary stoma or fistula: Secondary | ICD-10-CM | POA: Diagnosis not present

## 2025-01-12 NOTE — Progress Notes (Signed)
 [facility]   Reason for visit:  RLQ ileostomy  has had difficulty getting supplies.  HPI:  Exploratory lap, small bowel resection and right colectomy.  Creation of ileostomy Past Medical History:  Diagnosis Date   Asthma    Family History  Problem Relation Age of Onset   Stroke Mother    Hypertension Mother    Diabetes Mother    Allergies[1] Current Outpatient Medications  Medication Sig Dispense Refill Last Dose/Taking   albuterol  (VENTOLIN  HFA) 108 (90 Base) MCG/ACT inhaler Inhale 2 puffs into the lungs every 4 (four) hours as needed for wheezing or shortness of breath (.). 18 g 0    amiodarone  (PACERONE ) 200 MG tablet Take 1 tablet (200 mg total) by mouth 2 (two) times daily. 60 tablet 0    budesonide -formoterol  (SYMBICORT ) 160-4.5 MCG/ACT inhaler Inhale 2 puffs into the lungs every 12 (twelve) hours. 10.2 g 12    colchicine  0.6 MG tablet Take 0.5 tablets (0.3 mg total) by mouth daily. 15 tablet 0    cyanocobalamin  (VITAMIN B12) 1000 MCG/ML injection Inject 1 mL (1,000 mcg total) into the muscle once a week.      diphenoxylate -atropine  (LOMOTIL ) 2.5-0.025 MG tablet Take 1 tablet by mouth 4 (four) times daily. 120 tablet 0    empagliflozin  (JARDIANCE ) 10 MG TABS tablet Take 1 tablet (10 mg total) by mouth daily. 30 tablet 0    folic acid  (FOLVITE ) 1 MG tablet Take 1 tablet (1 mg total) by mouth at bedtime. 100 tablet 0    lidocaine  (LIDODERM ) 5 % Place 1 patch onto the skin daily. Remove & Discard patch within 12 hours or as directed by MD      loperamide  (IMODIUM ) 2 MG capsule Take 2 capsules (4 mg total) by mouth every 6 (six) hours. 240 capsule 0    metoprolol  succinate (TOPROL -XL) 25 MG 24 hr tablet Take 0.5 tablets (12.5 mg total) by mouth daily. 15 tablet 0    Multiple Vitamin (MULTIVITAMIN WITH MINERALS) TABS tablet Take 1 tablet by mouth daily. 100 tablet 0    oxyCODONE  (OXY IR/ROXICODONE ) 5 MG immediate release tablet Take 1 tablet (5 mg total) by mouth every  6 (six) hours as needed for moderate pain (pain score 4-6) or severe pain (pain score 7-10). 20 tablet 0    pantoprazole  (PROTONIX ) 40 MG tablet Take 1 tablet (40 mg total) by mouth daily. 30 tablet 0    polycarbophil (FIBERCON) 625 MG tablet Take 1 tablet (625 mg total) by mouth daily. 30 tablet 0    QUEtiapine  (SEROQUEL ) 25 MG tablet Take 1 tablet (25 mg total) by mouth at bedtime. 30 tablet 0    scopolamine  (TRANSDERM-SCOP) 1 MG/3DAYS Place 1 patch (1 mg total) onto the skin every 3 (three) days. 10 patch 12    simethicone  (MYLICON) 80 MG chewable tablet Chew 1 tablet (80 mg total) by mouth 4 (four) times daily as needed for flatulence. 30 tablet 0    spironolactone  (ALDACTONE ) 25 MG tablet Take 0.5 tablets (12.5 mg total) by mouth daily. 15 tablet 0    thiamine  (VITAMIN B1) 100 MG tablet Take 1 tablet (100 mg total) by mouth at bedtime. 100 tablet 0    umeclidinium bromide  (INCRUSE ELLIPTA ) 62.5 MCG/ACT AEPB Inhale 1 puff into the lungs daily. 30 each 0    warfarin (COUMADIN ) 5 MG tablet Take 1 tablet (5 mg total) by mouth daily at 4 PM. 30 tablet 0    No current facility-administered medications  for this encounter.   ROS  Review of Systems  Eyes: Negative.   Respiratory: Negative.    Cardiovascular: Negative.   Gastrointestinal:  Positive for abdominal pain.       Ileostomy  Musculoskeletal: Negative.   Skin:  Positive for color change (redness around stoma).  Psychiatric/Behavioral: Negative.    All other systems reviewed and are negative.  Vital signs:  BP 101/82 (BP Location: Right Arm)   Pulse (!) 117   Temp 98.2 F (36.8 C) (Oral)   Resp 18   SpO2 100%  Exam:  Physical Exam Vitals reviewed.  Constitutional:      Appearance: Normal appearance.  HENT:     Mouth/Throat:     Mouth: Mucous membranes are moist.     Comments: Discussed risk of dehydration and replacing lost electrolytes.  Discussed signs of dehydration, such dry mouth, dizziness, decreased urinary output  and dark urine. He endorses occasional dry mouth but no other symptoms Cardiovascular:     Comments: Tachycardia on arrival, decreased to 96 by conclusion of visit.  States he feels fine.  No dry mouth or dizziness.  Pulmonary:     Effort: Pulmonary effort is normal.  Abdominal:     Palpations: Abdomen is soft.  Musculoskeletal:        General: Normal range of motion.  Skin:    General: Skin is warm and dry.     Findings: Erythema present.  Neurological:     Mental Status: He is alert and oriented to person, place, and time. Mental status is at baseline.  Psychiatric:        Mood and Affect: Mood normal.     Stoma type/location:  RLQ ileostomy Stomal assessment/size:  1 3/4 pink budded Peristomal assessment:  Erythema and irritation.  Will switch to tapeless backing to improve peristomal skin  Treatment options for stomal/peristomal skin: barrier ring and 1 piece convex pouch (coloplast)  Output: liquid brown stool  Ostomy pouching: 1pc. convex Education provided:  will set up with [facility] for supply delivery.     Impression/dx  Ileostomy Irritant dermatitis Discussion  See back as needed.  Plan  Set up for supply delivery.  I personally spent a total of 50 minutes in the care of the patient today including preparing to see the patient and getting/reviewing separately obtained history performing a medically appropriate exam/evaluation, counseling and educating, placing orders, documenting clinical information in the EHR, independently interpreting results, coordinating care, and Pouch change, education and provided supplies. . .     Visit time: 50 minutes.   Darice Cooley FNP-BC        [1]  Allergies Allergen Reactions   Porcine (Pork) Protein-Containing Drug Products Other (See Comments)

## 2025-01-12 NOTE — Discharge Instructions (Signed)
 Switching to 1 piece convex pouch (coloplast)  Barrier ring Adhesive remover  Will set up with [facility]   [phone]

## 2025-01-13 ENCOUNTER — Ambulatory Visit

## 2025-01-13 ENCOUNTER — Other Ambulatory Visit (HOSPITAL_COMMUNITY): Payer: Self-pay

## 2025-01-14 ENCOUNTER — Ambulatory Visit

## 2025-01-15 DIAGNOSIS — L24B3 Irritant contact dermatitis related to fecal or urinary stoma or fistula: Secondary | ICD-10-CM | POA: Insufficient documentation

## 2025-01-15 DIAGNOSIS — Z432 Encounter for attention to ileostomy: Secondary | ICD-10-CM | POA: Insufficient documentation

## 2025-01-18 ENCOUNTER — Other Ambulatory Visit (HOSPITAL_COMMUNITY): Payer: Self-pay | Admitting: Nurse Practitioner

## 2025-01-18 ENCOUNTER — Ambulatory Visit: Admitting: Student in an Organized Health Care Education/Training Program

## 2025-01-18 ENCOUNTER — Encounter: Admitting: Registered Nurse

## 2025-01-18 DIAGNOSIS — L24B3 Irritant contact dermatitis related to fecal or urinary stoma or fistula: Secondary | ICD-10-CM

## 2025-01-18 DIAGNOSIS — Z433 Encounter for attention to colostomy: Secondary | ICD-10-CM

## 2025-01-19 ENCOUNTER — Other Ambulatory Visit: Payer: Self-pay | Admitting: Surgery

## 2025-01-19 ENCOUNTER — Encounter: Payer: Self-pay | Admitting: Student in an Organized Health Care Education/Training Program

## 2025-01-19 ENCOUNTER — Ambulatory Visit: Admitting: Student in an Organized Health Care Education/Training Program

## 2025-01-19 VITALS — BP 117/93 | HR 104 | Temp 98.2°F | Ht 70.0 in | Wt 126.0 lb

## 2025-01-19 DIAGNOSIS — E43 Unspecified severe protein-calorie malnutrition: Secondary | ICD-10-CM

## 2025-01-19 DIAGNOSIS — Z953 Presence of xenogenic heart valve: Secondary | ICD-10-CM | POA: Diagnosis not present

## 2025-01-19 DIAGNOSIS — I4892 Unspecified atrial flutter: Secondary | ICD-10-CM | POA: Insufficient documentation

## 2025-01-19 DIAGNOSIS — F1020 Alcohol dependence, uncomplicated: Secondary | ICD-10-CM | POA: Diagnosis not present

## 2025-01-19 DIAGNOSIS — Z932 Ileostomy status: Secondary | ICD-10-CM | POA: Diagnosis not present

## 2025-01-19 DIAGNOSIS — J449 Chronic obstructive pulmonary disease, unspecified: Secondary | ICD-10-CM | POA: Insufficient documentation

## 2025-01-19 DIAGNOSIS — R5381 Other malaise: Secondary | ICD-10-CM

## 2025-01-19 DIAGNOSIS — Z7689 Persons encountering health services in other specified circumstances: Secondary | ICD-10-CM

## 2025-01-19 DIAGNOSIS — I483 Typical atrial flutter: Secondary | ICD-10-CM | POA: Diagnosis not present

## 2025-01-19 DIAGNOSIS — I5022 Chronic systolic (congestive) heart failure: Secondary | ICD-10-CM | POA: Diagnosis not present

## 2025-01-19 DIAGNOSIS — Z952 Presence of prosthetic heart valve: Secondary | ICD-10-CM

## 2025-01-19 MED ORDER — METOPROLOL SUCCINATE ER 25 MG PO TB24: 12.5000 mg | ORAL_TABLET | Freq: Every day | ORAL | 1 refills | Status: AC

## 2025-01-19 MED ORDER — EMPAGLIFLOZIN 10 MG PO TABS: 10.0000 mg | ORAL_TABLET | Freq: Every day | ORAL | 1 refills | Status: AC

## 2025-01-19 MED ORDER — OXYCODONE HCL 5 MG PO TABS: 5.0000 mg | ORAL_TABLET | Freq: Two times a day (BID) | ORAL | 0 refills | Status: AC | PRN

## 2025-01-19 MED ORDER — PANTOPRAZOLE SODIUM 40 MG PO TBEC: 40.0000 mg | DELAYED_RELEASE_TABLET | Freq: Every day | ORAL | 2 refills | Status: AC

## 2025-01-19 MED ORDER — UMECLIDINIUM BROMIDE 62.5 MCG/ACT IN AEPB: 1.0000 | INHALATION_SPRAY | Freq: Every day | RESPIRATORY_TRACT | 2 refills | Status: AC

## 2025-01-19 MED ORDER — ALBUTEROL SULFATE HFA 108 (90 BASE) MCG/ACT IN AERS: 2.0000 | INHALATION_SPRAY | RESPIRATORY_TRACT | 2 refills | Status: AC | PRN

## 2025-01-19 MED ORDER — AMIODARONE HCL 200 MG PO TABS: 200.0000 mg | ORAL_TABLET | Freq: Two times a day (BID) | ORAL | 2 refills | Status: DC

## 2025-01-19 MED ORDER — SPIRONOLACTONE 25 MG PO TABS: 12.5000 mg | ORAL_TABLET | Freq: Every day | ORAL | 1 refills | Status: AC

## 2025-01-19 NOTE — Patient Instructions (Signed)
" °  VISIT SUMMARY: You had a follow-up appointment after your recent hospitalization and heart surgery. You have experienced significant weight loss and are adjusting to lifestyle changes, including living with an ileostomy and quitting smoking and drinking. You are currently on several medications and are focusing on your recovery at home.  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 You have bioprosthetic valves in your heart to help with this condition. Continue taking spironolactone  25 mg daily, Jardiance  10 mg daily, metoprolol  12.5 mg daily, and warfarin with consistent dietary adjustments. Follow up with your cardiologist and Coumadin  clinic.  -ILEOSTOMY STATUS POST ISCHEMIC BOWEL RESECTION: An ileostomy is a surgical opening created to allow waste to leave your body after part of your bowel was removed. Continue your current ostomy care regimen and follow up with your general surgeon for ongoing management.  -UNINTENTIONAL WEIGHT LOSS AND ANOREXIA: Unintentional weight loss and anorexia mean you have lost weight without trying and have a reduced appetite. This is likely due to your recent hospitalization and lifestyle changes. Encourage regular meals, avoid snacking, and increase physical activity to stimulate your appetite. Continue using nutritional supplements like Boost as needed.  -POSTOPERATIVE ABDOMINAL WOUND CARE: Your abdominal wound from surgery is healing well. Continue regular wound care and dressing changes to ensure it heals properly.  INSTRUCTIONS: Follow up with your cardiologist and Coumadin  clinic for heart failure management. Continue your current ostomy care regimen and follow up with your general surgeon. Encourage regular meals, avoid snacking, and increase physical activity to stimulate your appetite. Continue using nutritional supplements  like Boost as needed. Continue regular wound care and dressing changes for your abdominal wound.    Contains text generated by [employer].   "

## 2025-01-19 NOTE — Assessment & Plan Note (Signed)
 Terrible situation of severe protein calorie malnutrition.  Weight today is 126 pounds with a BMI of 18.  He is having difficulty with food intake, no structural issues, think related to mood and severe deconditioning.  He has got good family support.  He is adjusting to the presence of the ileostomy.  We talked about using nutrition supplements like boost or Ensure.  We talked about taking meals out of bed, and being sure to get out of bed every day.  Follow-up closely in 1 month for weight recheck.

## 2025-01-19 NOTE — Assessment & Plan Note (Signed)
 Chronic issue.  Doing very well, denies alcohol use since discharge.  Struggling with depression, but not really cravings.  Can offer naltrexone in the future if needed to manage cravings.

## 2025-01-19 NOTE — Assessment & Plan Note (Signed)
 Possible history of COPD, versus asthma.  He had prolonged respiratory failure requiring tracheostomy at a point, but now decannulized.  Not requiring supplemental oxygen .  Pulmonology prescribed him triple inhaler therapy with Incruse and Symbicort  daily.  I refilled those medications for him.  May not need these long-term.  Once things have stabilized, would like to get pulmonary function testing.

## 2025-01-19 NOTE — Assessment & Plan Note (Signed)
 Terrible situation, very physically deconditioned at this point.  Seems like he did well with inpatient rehab, but has had significant decline since returning home.  Most days he does not get out of bed.  Very sarcopenic on exam.  Diffusely weak.  Going to be a long time before he is going to be back to usual functional state.  He is due to start outpatient physical therapy, having some delay because of local weather.  Hopefully will start that later this week and we talked about motivation to do the exercises at home.  He declines the need of any assist devices.

## 2025-01-19 NOTE — Assessment & Plan Note (Signed)
 He had a period of atrial flutter related to his acute illness and hospitalization.  Currently doing well on amiodarone .  May not need to be a long-term medication.  Follow-up with Dr. Zenaida is set for next week.  Currently on warfarin for prevention of ischemic stroke because of bioprosthetic valve replacements at the mitral and aortic positions.  He has follow-up with the warfarin clinic with cardiology tomorrow.

## 2025-01-19 NOTE — Assessment & Plan Note (Signed)
 Severe nonischemic heart failure in a young man, likely related to severe valve disease and possibly alcohol use disorder.  He now has had bioprosthetic valve replacements at the aortic and mitral positions, that surgery in December was complicated by cardiogenic shock with a number of issues including small bowel necrosis, prolonged respiratory failure requiring tracheostomy, and right sided hemothorax.  Doing better since discharge.  Appears euvolemic and well compensated today.  Very low functional state which I think is mostly driven by deconditioning rather than heart failure.  No signs of congestion on exam.  Currently on goal-directed therapy with spironolactone  12.5 mg daily, metoprolol  succinate 12.5 mg daily, Jardiance  10 mg daily.  He was not discharged on an ARB or Arni, probably due to the cardiogenic shock.  Blood pressure today is still borderline at 117/93.  He has follow-up with Dr. Zenaida in advanced heart failure clinic next Monday.  Will check labs.  I refilled the rest of his goal-directed medical therapy.

## 2025-01-19 NOTE — Progress Notes (Signed)
 "  New Patient Office Visit  Patient ID: Alejandro Lopez, Male   DOB: 06-25-1981 44 y.o. MRN: [id number]  Chief Complaint  Patient presents with   Establish Care   Subjective:     Alejandro Lopez presents to establish care  HPI  Discussed the use of AI scribe software for clinical note transcription with the patient, who gave verbal consent to proceed.  History of Present Illness Alejandro Lopez is a 44 year old male with heart failure and recent mitral valve surgery who presents for follow-up after hospitalization.  He was hospitalized from November 30th to January 13th for heart-related issues, including heart failure and valve problems. During his hospital stay, he underwent open-heart surgery. He recalls that he may have replaced a valve, but he was not certain of the details. Post-surgery, he experienced internal bleeding after a procedure to draw fluid from his lungs.  He has experienced significant weight loss since his hospitalization, dropping from 176 pounds to 126 pounds, which he attributes to fluid loss. 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  He has quit smoking and drinking and is adjusting to these lifestyle changes. He spends most of his time in bed, feeling weak and struggling with a lack of appetite. He eats irregularly, often only one meal a day, and experiences a lack of hunger. He is trying to adjust to living with an ileostomy, which affects his eating habits and bowel movements.  He has been attending physical therapy and plans to continue once his appointments are rescheduled. He acknowledges a need to establish a new daily routine to aid his recovery and improve his mood, which he describes as needing adjustment to his new lifestyle.  He is currently on several medications, including warfarin, spironolactone , Jardiance , and metoprolol . He uses oxycodone  sparingly,  primarily for pain associated with dreams that cause him to jerk in his sleep.  No nausea, vomiting, or pain when eating or swallowing. He reports weakness and a lack of appetite. He does not describe symptoms of depression but acknowledges a need to adjust to his new lifestyle.   Outpatient Encounter Medications as of 01/19/2025  Medication Sig   budesonide -formoterol  (SYMBICORT ) 160-4.5 MCG/ACT inhaler Inhale 2 puffs into the lungs every 12 (twelve) hours.   folic acid  (FOLVITE ) 1 MG tablet Take 1 tablet (1 mg total) by mouth at bedtime.   loperamide  (IMODIUM ) 2 MG capsule Take 2 capsules (4 mg total) by mouth every 6 (six) hours.   [DISCONTINUED] albuterol  (VENTOLIN  HFA) 108 (90 Base) MCG/ACT inhaler Inhale 2 puffs into the lungs every 4 (four) hours as needed for wheezing or shortness of breath (.).   [DISCONTINUED] colchicine  0.6 MG tablet Take 0.5 tablets (0.3 mg total) by mouth daily.   [DISCONTINUED] diphenoxylate -atropine  (LOMOTIL ) 2.5-0.025 MG tablet Take 1 tablet by mouth 4 (four) times daily.   [DISCONTINUED] Multiple Vitamin (MULTIVITAMIN WITH MINERALS) TABS tablet Take 1 tablet by mouth daily.   [DISCONTINUED] oxyCODONE  (OXY IR/ROXICODONE ) 5 MG immediate release tablet Take 1 tablet (5 mg total) by mouth every 6 (six) hours as needed for moderate pain (pain score 4-6) or severe pain (pain score 7-10).   [DISCONTINUED] polycarbophil (FIBERCON) 625 MG tablet Take 1 tablet (625 mg total) by mouth daily.   albuterol  (VENTOLIN  HFA) 108 (90 Base) MCG/ACT inhaler Inhale 2 puffs into the lungs every 4 (four) hours as needed for wheezing  or shortness of breath (.).   amiodarone  (PACERONE ) 200 MG tablet Take 1 tablet (200 mg total) by mouth 2 (two) times daily.   empagliflozin  (JARDIANCE ) 10 MG TABS tablet Take 1 tablet (10 mg total) by mouth daily.   metoprolol  succinate (TOPROL -XL) 25 MG 24 hr tablet Take 0.5 tablets (12.5 mg total) by mouth daily.   oxyCODONE  (OXY IR/ROXICODONE ) 5 MG  immediate release tablet Take 1 tablet (5 mg total) by mouth 2 (two) times daily as needed for severe pain (pain score 7-10).   pantoprazole  (PROTONIX ) 40 MG tablet Take 1 tablet (40 mg total) by mouth daily.   spironolactone  (ALDACTONE ) 25 MG tablet Take 0.5 tablets (12.5 mg total) by mouth daily.   thiamine  (VITAMIN B1) 100 MG tablet Take 1 tablet (100 mg total) by mouth at bedtime.   umeclidinium bromide  (INCRUSE ELLIPTA ) 62.5 MCG/ACT AEPB Inhale 1 puff into the lungs daily.   warfarin (COUMADIN ) 5 MG tablet Take 1 tablet (5 mg total) by mouth daily at 4 PM.   [DISCONTINUED] amiodarone  (PACERONE ) 200 MG tablet Take 1 tablet (200 mg total) by mouth 2 (two) times daily.   [DISCONTINUED] cyanocobalamin  (VITAMIN B12) 1000 MCG/ML injection Inject 1 mL (1,000 mcg total) into the muscle once a week. (Patient not taking: Reported on 01/19/2025)   [DISCONTINUED] empagliflozin  (JARDIANCE ) 10 MG TABS tablet Take 1 tablet (10 mg total) by mouth daily.   [DISCONTINUED] lidocaine  (LIDODERM ) 5 % Place 1 patch onto the skin daily. Remove & Discard patch within 12 hours or as directed by MD   [DISCONTINUED] metoprolol  succinate (TOPROL -XL) 25 MG 24 hr tablet Take 0.5 tablets (12.5 mg total) by mouth daily.   [DISCONTINUED] pantoprazole  (PROTONIX ) 40 MG tablet Take 1 tablet (40 mg total) by mouth daily.   [DISCONTINUED] QUEtiapine  (SEROQUEL ) 25 MG tablet Take 1 tablet (25 mg total) by mouth at bedtime.   [DISCONTINUED] scopolamine  (TRANSDERM-SCOP) 1 MG/3DAYS Place 1 patch (1 mg total) onto the skin every 3 (three) days. (Patient not taking: Reported on 01/19/2025)   [DISCONTINUED] simethicone  (MYLICON) 80 MG chewable tablet Chew 1 tablet (80 mg total) by mouth 4 (four) times daily as needed for flatulence.   [DISCONTINUED] spironolactone  (ALDACTONE ) 25 MG tablet Take 0.5 tablets (12.5 mg total) by mouth daily.   [DISCONTINUED] umeclidinium bromide  (INCRUSE ELLIPTA ) 62.5 MCG/ACT AEPB Inhale 1 puff into the lungs  daily.   No facility-administered encounter medications on file as of 01/19/2025.    Past Medical History:  Diagnosis Date   Asthma    Nonrheumatic aortic valve insufficiency 11/24/2024   Nonrheumatic mitral valve regurgitation 11/24/2024    Past Surgical History:  Procedure Laterality Date   AORTIC VALVE REPLACEMENT N/A 11/30/2024   Procedure: REPLACEMENT, AORTIC VALVE, OPEN WITH INSPIRIS RESILIA AORTIC VALVE 23mm;  Surgeon: Lucas Dorise POUR, MD;  Location: [facility] OR;  Service: Open Heart Surgery;  Laterality: N/A;   BOWEL RESECTION N/A 12/08/2024   Procedure: EXCISION, SMALL INTESTINE;  Surgeon: Ebbie Cough, MD;  Location: [facility] OR;  Service: General;  Laterality: N/A;   COLOSTOMY REVISION Right 12/06/2024   Procedure: COLECTOMY, RIGHT;  Surgeon: Tanda Locus, MD;  Location: [facility] OR;  Service: General;  Laterality: Right;   FRACTURE SURGERY     ILEOSTOMY N/A 12/08/2024   Procedure: CREATION, ILEOSTOMY END;  Surgeon: Ebbie Cough, MD;  Location: [facility] OR;  Service: General;  Laterality: N/A;   INTRAOPERATIVE TRANSESOPHAGEAL ECHOCARDIOGRAM N/A 11/30/2024   Procedure: ECHOCARDIOGRAM, TRANSESOPHAGEAL, INTRAOPERATIVE;  Surgeon: Lucas Dorise POUR, MD;  Location:  [facility] OR;  Service: Open Heart Surgery;  Laterality: N/A;   IR ANGIOGRAM FOLLOW UP STUDY  12/04/2024   IR ANGIOGRAM SELECTIVE EACH ADDITIONAL VESSEL  12/04/2024   IR ANGIOGRAM VISCERAL SELECTIVE  12/04/2024   IR EMBO ART  VEN HEMORR LYMPH EXTRAV  INC GUIDE ROADMAPPING  12/04/2024   IR US  GUIDE VASC ACCESS RIGHT  12/04/2024   LAPAROTOMY N/A 12/03/2024   Procedure: LAPAROTOMY, EXPLORATORY small bowel resection abthrea therapy;  Surgeon: Sebastian Moles, MD;  Location: [facility] OR;  Service: General;  Laterality: N/A;  possible bowel resection   LAPAROTOMY N/A 12/06/2024   Procedure: LAPAROTOMY, EXPLORATORY; WOUND VAC CHANGE;  Surgeon: Tanda Locus, MD;  Location: [facility] OR;  Service: General;  Laterality: N/A;   LAPAROTOMY N/A 12/08/2024    Procedure: MACARIO LIVINGS;  Surgeon: Ebbie Cough, MD;  Location: [facility] OR;  Service: General;  Laterality: N/A;   MITRAL VALVE REPLACEMENT N/A 11/30/2024   Procedure: REPLACEMENT, MITRAL VALVE WITH MITRIS RESILIA MITRAL VALVE 27mm;  Surgeon: Lucas Dorise POUR, MD;  Location: [facility] OR;  Service: Open Heart Surgery;  Laterality: N/A;   PLACEMENT OF IMPELLA LEFT VENTRICULAR ASSIST DEVICE N/A 11/30/2024   Procedure: INSERTION, CARDIAC ASSIST DEVICE, IMPELLA;  Surgeon: Lucas Dorise POUR, MD;  Location: [facility] OR;  Service: Open Heart Surgery;  Laterality: N/A;  IMPELLA 5.5 INSERTION   REMOVAL OF IMPELLA LEFT VENTRICULAR ASSIST DEVICE Right 12/16/2024   Procedure: REMOVAL, CARDIAC ASSIST DEVICE, IMPELLA;  Surgeon: Lucas Dorise POUR, MD;  Location: [facility] OR;  Service: Open Heart Surgery;  Laterality: Right;   RIGHT HEART CATH AND CORONARY ANGIOGRAPHY N/A 11/27/2024   Procedure: RIGHT HEART CATH AND CORONARY ANGIOGRAPHY;  Surgeon: Zenaida Morene PARAS, MD;  Location: [facility] INVASIVE CV LAB;  Service: Cardiovascular;  Laterality: N/A;   TRANSESOPHAGEAL ECHOCARDIOGRAM (CATH LAB) N/A 11/27/2024   Procedure: TRANSESOPHAGEAL ECHOCARDIOGRAM;  Surgeon: Zenaida Morene PARAS, MD;  Location: [facility] INVASIVE CV LAB;  Service: Cardiovascular;  Laterality: N/A;   VIDEO ASSISTED THORACOSCOPY (VATS)/DECORTICATION Right 12/10/2024   Procedure: VIDEO ASSISTED THORACOSCOPY (VATS)/DECORTICATION;  Surgeon: Shyrl Linnie KIDD, MD;  Location: [facility] OR;  Service: Thoracic;  Laterality: Right;    Family History  Problem Relation Age of Onset   Stroke Mother    Hypertension Mother    Diabetes Mother       Objective:    BP (!) 117/93   Pulse (!) 104   Temp 98.2 F (36.8 C)   Ht 5' 10 (1.778 m)   Wt 126 lb (57.2 kg)   SpO2 99%   BMI 18.08 kg/m   Physical Exam  Gen: Severely deconditioned appearing man, frail appearing Neck: Thin, no adenopathy, no JVD Heart: Distant sounds, regular, no rubs or gallops Lungs: Unlabored, no  crackles, no wheezing, diminished breath sounds at the right base, dull to percussion at the right base compared to the left, normal work of breathing Abd: Soft, nontender, well-healing midline surgical incision, ostomy is in place in the right lower quadrant, it is pink/patent/productive Ext: Diffuse sarcopenia, warm and well-perfused with no edema Neuro: Alert, conversational, not sedated, slow get up and go, diminished strength diffusely but no focal neurodeficits Psych: Depressed appearing, linear thoughts, organized speech        Assessment & Plan:   Problem List Items Addressed This Visit       High   Chronic HFrEF (heart failure with reduced ejection fraction) (HCC) - Primary (Chronic)   Severe nonischemic heart failure in a young man, likely related to severe valve disease  and possibly alcohol use disorder.  He now has had bioprosthetic valve replacements at the aortic and mitral positions, that surgery in December was complicated by cardiogenic shock with a number of issues including small bowel necrosis, prolonged respiratory failure requiring tracheostomy, and right sided hemothorax.  Doing better since discharge.  Appears euvolemic and well compensated today.  Very low functional state which I think is mostly driven by deconditioning rather than heart failure.  No signs of congestion on exam.  Currently on goal-directed therapy with spironolactone  12.5 mg daily, metoprolol  succinate 12.5 mg daily, Jardiance  10 mg daily.  He was not discharged on an ARB or Arni, probably due to the cardiogenic shock.  Blood pressure today is still borderline at 117/93.  He has follow-up with Dr. Zenaida in advanced heart failure clinic next Monday.  Will check labs.  I refilled the rest of his goal-directed medical therapy.      Relevant Medications   amiodarone  (PACERONE ) 200 MG tablet   empagliflozin  (JARDIANCE ) 10 MG TABS tablet   metoprolol  succinate (TOPROL -XL) 25 MG 24 hr tablet    spironolactone  (ALDACTONE ) 25 MG tablet   Other Relevant Orders   CBC   Comprehensive metabolic panel with GFR   IBC + Ferritin   Physical deconditioning (Chronic)   Terrible situation, very physically deconditioned at this point.  Seems like he did well with inpatient rehab, but has had significant decline since returning home.  Most days he does not get out of bed.  Very sarcopenic on exam.  Diffusely weak.  Going to be a long time before he is going to be back to usual functional state.  He is due to start outpatient physical therapy, having some delay because of local weather.  Hopefully will start that later this week and we talked about motivation to do the exercises at home.  He declines the need of any assist devices.      Severe protein-calorie malnutrition (Chronic)   Terrible situation of severe protein calorie malnutrition.  Weight today is 126 pounds with a BMI of 18.  He is having difficulty with food intake, no structural issues, think related to mood and severe deconditioning.  He has got good family support.  He is adjusting to the presence of the ileostomy.  We talked about using nutrition supplements like boost or Ensure.  We talked about taking meals out of bed, and being sure to get out of bed every day.  Follow-up closely in 1 month for weight recheck.        Medium    Alcohol use disorder, moderate, dependence (HCC) (Chronic)   Chronic issue.  Doing very well, denies alcohol use since discharge.  Struggling with depression, but not really cravings.  Can offer naltrexone in the future if needed to manage cravings.      Ileostomy present (HCC) (Chronic)   Ileostomy is in place in the right lower quadrants, it is functioning well.  He is doing a good job managing the large abdominal surgical wound which is healing well, no signs of surgical site infection.  He has an appointment tomorrow with [facility] surgery, but it actually conflicts with his appointment with  cardiothoracic surgery.  I recommended that he call to reschedule the appointment with Dr. Sebastian.  We talked about the importance of improving his conditioning so that 1 day he may go for ileostomy takedown.      History of mitral valve replacement with bioprosthetic valve (Chronic)   Valve function seemingly  is doing well.  He is on Coumadin  for primary prevention of a stroke.  Coumadin  is being managed by the Coumadin  clinic, and he has an appointment with them tomorrow.        Low   Atrial flutter (HCC) (Chronic)   He had a period of atrial flutter related to his acute illness and hospitalization.  Currently doing well on amiodarone .  May not need to be a long-term medication.  Follow-up with Dr. Zenaida is set for next week.  Currently on warfarin for prevention of ischemic stroke because of bioprosthetic valve replacements at the mitral and aortic positions.  He has follow-up with the warfarin clinic with cardiology tomorrow.      Relevant Medications   amiodarone  (PACERONE ) 200 MG tablet   metoprolol  succinate (TOPROL -XL) 25 MG 24 hr tablet   spironolactone  (ALDACTONE ) 25 MG tablet   COPD (chronic obstructive pulmonary disease) (HCC) (Chronic)   Possible history of COPD, versus asthma.  He had prolonged respiratory failure requiring tracheostomy at a point, but now decannulized.  Not requiring supplemental oxygen .  Pulmonology prescribed him triple inhaler therapy with Incruse and Symbicort  daily.  I refilled those medications for him.  May not need these long-term.  Once things have stabilized, would like to get pulmonary function testing.      Relevant Medications   albuterol  (VENTOLIN  HFA) 108 (90 Base) MCG/ACT inhaler   umeclidinium bromide  (INCRUSE ELLIPTA ) 62.5 MCG/ACT AEPB    Return in about 4 weeks (around 02/16/2025).   Cleatus Debby Specking, MD [facility]     "

## 2025-01-19 NOTE — Assessment & Plan Note (Signed)
 Valve function seemingly is doing well.  He is on Coumadin  for primary prevention of a stroke.  Coumadin  is being managed by the Coumadin  clinic, and he has an appointment with them tomorrow.

## 2025-01-19 NOTE — Assessment & Plan Note (Signed)
 Ileostomy is in place in the right lower quadrants, it is functioning well.  He is doing a good job managing the large abdominal surgical wound which is healing well, no signs of surgical site infection.  He has an appointment tomorrow with [facility] surgery, but it actually conflicts with his appointment with cardiothoracic surgery.  I recommended that he call to reschedule the appointment with Dr. Sebastian.  We talked about the importance of improving his conditioning so that 1 day he may go for ileostomy takedown.

## 2025-01-20 ENCOUNTER — Encounter: Payer: Self-pay | Admitting: Surgery

## 2025-01-20 ENCOUNTER — Ambulatory Visit

## 2025-01-20 ENCOUNTER — Ambulatory Visit: Payer: Self-pay | Attending: Surgery | Admitting: Surgery

## 2025-01-20 ENCOUNTER — Ambulatory Visit (HOSPITAL_COMMUNITY)
Admission: RE | Admit: 2025-01-20 | Discharge: 2025-01-20 | Disposition: A | Discharge status: Home / Self Care | Source: Ambulatory Visit | Attending: Cardiovascular Disease | Admitting: Cardiovascular Disease

## 2025-01-20 VITALS — BP 101/73 | HR 108 | Resp 20 | Ht 70.0 in | Wt 126.0 lb

## 2025-01-20 DIAGNOSIS — Z953 Presence of xenogenic heart valve: Secondary | ICD-10-CM

## 2025-01-20 DIAGNOSIS — Z5181 Encounter for therapeutic drug level monitoring: Secondary | ICD-10-CM | POA: Insufficient documentation

## 2025-01-20 DIAGNOSIS — Z952 Presence of prosthetic heart valve: Secondary | ICD-10-CM | POA: Insufficient documentation

## 2025-01-20 LAB — POCT INR: INR: 2.6 (ref 2.0–3.0)

## 2025-01-20 NOTE — Progress Notes (Signed)
 Description   INR-2.6; Continue taking warfarin 1 tablet daily except for 1/2 tablet on Wednesday and Friday. Recheck INR in 1 week. Coumadin  Clinic [phone]

## 2025-01-20 NOTE — Progress Notes (Signed)
 "  [address] [place] [address]             [phone]    HPI:  The patient returns for routine postoperative follow-up status post AVR using a 23 mm Edwards Inspiras pericardial valve, chordal sparing mitral valve replacement using a 27 mm Edwards Mitris pericardial valve and insertion of an Impella 5.5 via the right axillary artery for left ventricular support on 11/30/2024.  He developed a right pleural effusion postoperatively that was treated by right thoracentesis resulting in laceration of an intercostal artery and massive right hemothorax with hemodynamic instability.  This required embolization of the intercostal artery by IR.  He subsequently developed an acute abdomen requiring exploratory laparotomy and small bowel resection.  He was taken back to the operating room a few days later for reexploration with right hemicolectomy without anastomosis.  He was then taken back to the operating room few days later for small bowel resection and end ileostomy.  He subsequently required right VATS for decortication and drainage of retained hemothorax on 12/10/2024.  His postoperative course was complicated by ventilator dependent respiratory failure requiring tracheostomy as well as acute kidney injury requiring CRRT.  He gradually improved and was taken back to the operating room for removal of the Impella.  He was weaned off the ventilator and then CRRT was stopped with return of urine output and he never required further dialysis.  His tracheostomy was removed and he was transferred to CIR for further recovery.  He is here today with his wife.  Since going home his main complaints have been poor appetite with food not tasting good.  He has been taking fluids well and has had no problems with dehydration.  He has lost significant muscle mass compared to his preoperative state.  He is supposed to start outpatient physical therapy soon.  He has an appointment later today to have  his INR checked.  His last INR on 01/05/2025 was 2.8.  He denies any shortness of breath.  He still has some cough productive of yellow sputum.  He denies any chest or abdominal pain.  He has been managing his ileostomy well.  He said he is still occasionally passing some stool per rectum.  Current Outpatient Medications  Medication Sig Dispense Refill   albuterol  (VENTOLIN  HFA) 108 (90 Base) MCG/ACT inhaler Inhale 2 puffs into the lungs every 4 (four) hours as needed for wheezing or shortness of breath (.). 18 g 2   amiodarone  (PACERONE ) 200 MG tablet Take 1 tablet (200 mg total) by mouth 2 (two) times daily. 60 tablet 2   budesonide -formoterol  (SYMBICORT ) 160-4.5 MCG/ACT inhaler Inhale 2 puffs into the lungs every 12 (twelve) hours. 10.2 g 12   empagliflozin  (JARDIANCE ) 10 MG TABS tablet Take 1 tablet (10 mg total) by mouth daily. 90 tablet 1   folic acid  (FOLVITE ) 1 MG tablet Take 1 tablet (1 mg total) by mouth at bedtime. 100 tablet 0   loperamide  (IMODIUM ) 2 MG capsule Take 2 capsules (4 mg total) by mouth every 6 (six) hours. 240 capsule 0   metoprolol  succinate (TOPROL -XL) 25 MG 24 hr tablet Take 0.5 tablets (12.5 mg total) by mouth daily. 45 tablet 1   oxyCODONE  (OXY IR/ROXICODONE ) 5 MG immediate release tablet Take 1 tablet (5 mg total) by mouth 2 (two) times daily as needed for severe pain (pain score 7-10). 10 tablet 0   pantoprazole  (PROTONIX ) 40 MG tablet Take 1 tablet (  40 mg total) by mouth daily. 30 tablet 2   spironolactone  (ALDACTONE ) 25 MG tablet Take 0.5 tablets (12.5 mg total) by mouth daily. 45 tablet 1   thiamine  (VITAMIN B1) 100 MG tablet Take 1 tablet (100 mg total) by mouth at bedtime. 100 tablet 0   umeclidinium bromide  (INCRUSE ELLIPTA ) 62.5 MCG/ACT AEPB Inhale 1 puff into the lungs daily. 30 each 2   warfarin (COUMADIN ) 5 MG tablet Take 1 tablet (5 mg total) by mouth daily at 4 PM. 30 tablet 0   No current facility-administered medications for this visit.      Physical Exam: BP 101/73   Pulse (!) 108   Resp 20   Ht 5' 10 (1.778 m)   Wt 126 lb (57.2 kg)   SpO2 98% Comment: RA  BMI 18.08 kg/m  He looks thin and somewhat frail but in good spirits. Cardiac exam shows a regular rate and rhythm with normal heart sounds.  There is no murmur. Lung exam reveals decreased breath sounds at the right base. The chest and Impella incisions are well-healed and the sternum is stable. Abdominal exam shows active bowel sounds.  His abdomen is soft and nontender. There is no peripheral edema.  Diagnostic Tests:  Narrative & Impression  EXAM: 2 VIEW(S) XRAY OF THE CHEST 01/20/2025 10:39:58 AM   COMPARISON: 12/21/2024   CLINICAL HISTORY: Aortic valve replacement, mitral valve replacement.   FINDINGS:   LINES, TUBES AND DEVICES: Aortic and mitral valve replacement. Endovascular coils in the posterior right lung base. Prominent.   LUNGS AND PLEURA: Elevated right hemidiaphragm. Hazy opacity at right lung base with costophrenic angle blunting, likely representing small pleural effusion, unchanged. Endovascular coils are present in the posterior right lung base. No pneumothorax.   HEART AND MEDIASTINUM: Median sternotomy noted. No acute abnormality of the cardiac and mediastinal silhouettes.   BONES AND SOFT TISSUES: Median sternotomy noted. Surgical clips in right upper quadrant. No acute osseous abnormality.   IMPRESSION: 1. Aortic and mitral valve replacements. Small right pleural effusion, unchanged.   Electronically signed by: Rogelia Myers MD 01/20/2025 11:30 AM EST RP Workstation: [id number]    Impression:  Overall he is doing fairly well 7 weeks following his surgery especially considering his complicated postoperative course.  His chest x-ray shows a persistent density at the right base which is most likely retained hemothorax that is well organized at this point.  Lungs are otherwise clear and the cardiac silhouette is  back to normal.  His last echocardiogram in the hospital showed a large circumferential pericardial effusion without tamponade.  He will require a repeat echocardiogram which I am sure will be ordered when he sees Dr. Zenaida next week.  I encouraged him to continue eating is much as possible and he may be better off eating multiple small meals per day.  He is planning on participating in outpatient physical therapy.  Plan:  He is going to have his INR checked today.  I think he needs to be on Coumadin  for 3 months postoperatively and then can be converted to aspirin .  I will plan to see him back in 1 month with a chest x-ray.   Dorise MARLA Fellers, MD [facility] [phone]       "

## 2025-01-20 NOTE — Patient Instructions (Signed)
 Description   INR-2.6; Continue taking warfarin 1 tablet daily except for 1/2 tablet on Wednesday and Friday. Recheck INR in 1 week. Coumadin  Clinic [phone]

## 2025-01-22 ENCOUNTER — Ambulatory Visit (HOSPITAL_COMMUNITY)
Admission: RE | Admit: 2025-01-22 | Discharge: 2025-01-22 | Disposition: A | Source: Ambulatory Visit | Attending: *Deleted | Admitting: *Deleted

## 2025-01-22 ENCOUNTER — Telehealth (HOSPITAL_COMMUNITY): Payer: Self-pay | Admitting: Cardiology

## 2025-01-22 DIAGNOSIS — Z432 Encounter for attention to ileostomy: Secondary | ICD-10-CM | POA: Insufficient documentation

## 2025-01-22 DIAGNOSIS — R5383 Other fatigue: Secondary | ICD-10-CM | POA: Insufficient documentation

## 2025-01-22 DIAGNOSIS — Z932 Ileostomy status: Secondary | ICD-10-CM | POA: Diagnosis present

## 2025-01-22 NOTE — Telephone Encounter (Signed)
 Called to confirm/remind patient of their appointment at the [facility] on 01/22/2025.   Appointment:   [x] Confirmed  [] Left mess   [] No answer/No voice mail  [] VM Full/unable to leave message  [] Phone not in service  Patient reminded to bring all medications and/or complete list.  Confirmed patient has transportation. Gave directions, instructed to utilize valet parking.

## 2025-01-22 NOTE — Progress Notes (Signed)
 [facility]   Reason for visit:  RLQ ileostomy HPI:  Exploratory laparotomy  creation of ileostomy  Past Medical History:  Diagnosis Date   Asthma    Nonrheumatic aortic valve insufficiency 11/24/2024   Nonrheumatic mitral valve regurgitation 11/24/2024   Family History  Problem Relation Age of Onset   Stroke Mother    Hypertension Mother    Diabetes Mother    Allergies[1] Current Outpatient Medications  Medication Sig Dispense Refill Last Dose/Taking   albuterol  (VENTOLIN  HFA) 108 (90 Base) MCG/ACT inhaler Inhale 2 puffs into the lungs every 4 (four) hours as needed for wheezing or shortness of breath (.). 18 g 2    amiodarone  (PACERONE ) 200 MG tablet Take 1 tablet (200 mg total) by mouth 2 (two) times daily. 60 tablet 2    budesonide -formoterol  (SYMBICORT ) 160-4.5 MCG/ACT inhaler Inhale 2 puffs into the lungs every 12 (twelve) hours. 10.2 g 12    empagliflozin  (JARDIANCE ) 10 MG TABS tablet Take 1 tablet (10 mg total) by mouth daily. 90 tablet 1    folic acid  (FOLVITE ) 1 MG tablet Take 1 tablet (1 mg total) by mouth at bedtime. 100 tablet 0    loperamide  (IMODIUM ) 2 MG capsule Take 2 capsules (4 mg total) by mouth every 6 (six) hours. 240 capsule 0    metoprolol  succinate (TOPROL -XL) 25 MG 24 hr tablet Take 0.5 tablets (12.5 mg total) by mouth daily. 45 tablet 1    oxyCODONE  (OXY IR/ROXICODONE ) 5 MG immediate release tablet Take 1 tablet (5 mg total) by mouth 2 (two) times daily as needed for severe pain (pain score 7-10). 10 tablet 0    pantoprazole  (PROTONIX ) 40 MG tablet Take 1 tablet (40 mg total) by mouth daily. 30 tablet 2    spironolactone  (ALDACTONE ) 25 MG tablet Take 0.5 tablets (12.5 mg total) by mouth daily. 45 tablet 1    thiamine  (VITAMIN B1) 100 MG tablet Take 1 tablet (100 mg total) by mouth at bedtime. 100 tablet 0    umeclidinium bromide  (INCRUSE ELLIPTA ) 62.5 MCG/ACT AEPB Inhale 1 puff into the lungs daily. 30 each 2    warfarin (COUMADIN ) 5 MG tablet  Take 1 tablet (5 mg total) by mouth daily at 4 PM. 30 tablet 0    No current facility-administered medications for this encounter.   ROS  Review of Systems  Constitutional:  Positive for fatigue.  Respiratory: Negative.    Cardiovascular:        BP elevated on arrival.  Rechecked at end of visit.  122/86  Heart rate down to 96  Gastrointestinal:        RLQ ileostomy   Skin:  Positive for color change.  Psychiatric/Behavioral:  Positive for dysphoric mood.   All other systems reviewed and are negative.  Vital signs:  BP (!) 115/94 (BP Location: Right Arm)   Pulse (!) 106   Temp 97.7 F (36.5 C) (Oral)   Resp 19   SpO2 100%  Exam:  Physical Exam Vitals reviewed.  Constitutional:      Appearance: He is ill-appearing.  HENT:     Mouth/Throat:     Mouth: Mucous membranes are dry.  Cardiovascular:     Rate and Rhythm: Normal rate.  Pulmonary:     Effort: Pulmonary effort is normal.  Abdominal:     Palpations: Abdomen is soft.  Musculoskeletal:        General: Normal range of motion.  Skin:    General: Skin is warm and  dry.     Findings: Erythema present.  Neurological:     Mental Status: He is alert and oriented to person, place, and time. Mental status is at baseline.  Psychiatric:        Mood and Affect: Mood normal.        Behavior: Behavior normal.     Stoma type/location:  RLQ ileostomy  Stomal assessment/size:  1 3/8 pink and budded Peristomal assessment:  intact but remains pink. Improved Treatment options for stomal/peristomal skin: barrier ring and 1 piece convex pouch  Output: liquid brown stool  Ostomy pouching: 1pc. Convex with barrier ring  Education provided:  [facility]cription [facility] to [facility], they state they have not gotten a call.  Will re send.     Impression/dx  Ileostomy  Risk for dehydration Discussion  Here with wife who states he is not eating well. We discuss sources of protein.  I encourage fluids to avoid dehydration and he agrees to try    Wife states that urine is yellow today.  Plan  See back as needed.  Need to get established for supplies.  Will follow up.  I personally spent a total of 40 minutes in the care of the patient today including preparing to see the patient, getting/reviewing separately obtained history, performing a medically appropriate exam/evaluation, counseling and educating, placing orders, referring and communicating with other health care professionals, documenting clinical information in the EHR, coordinating care, and pouch change. .     Visit time: 40 minutes.   Darice Cooley FNP-BC        [1]  Allergies Allergen Reactions   Porcine (Pork) Protein-Containing Drug Products Other (See Comments)

## 2025-01-24 DIAGNOSIS — Z9189 Other specified personal risk factors, not elsewhere classified: Secondary | ICD-10-CM | POA: Insufficient documentation

## 2025-01-24 DIAGNOSIS — L24B3 Irritant contact dermatitis related to fecal or urinary stoma or fistula: Secondary | ICD-10-CM | POA: Insufficient documentation

## 2025-01-24 DIAGNOSIS — Z432 Encounter for attention to ileostomy: Secondary | ICD-10-CM | POA: Insufficient documentation

## 2025-01-24 NOTE — Discharge Instructions (Signed)
Set up with [place] 

## 2025-01-25 ENCOUNTER — Encounter (HOSPITAL_COMMUNITY): Payer: Self-pay | Admitting: Cardiology

## 2025-01-25 ENCOUNTER — Ambulatory Visit (HOSPITAL_COMMUNITY)
Admit: 2025-01-25 | Discharge: 2025-01-25 | Disposition: A | Discharge status: Home / Self Care | Attending: Cardiology | Admitting: Cardiology

## 2025-01-25 VITALS — BP 102/70 | HR 110 | Wt 124.6 lb

## 2025-01-25 DIAGNOSIS — I3139 Other pericardial effusion (noninflammatory): Secondary | ICD-10-CM | POA: Insufficient documentation

## 2025-01-25 DIAGNOSIS — I483 Typical atrial flutter: Secondary | ICD-10-CM | POA: Diagnosis not present

## 2025-01-25 DIAGNOSIS — I5022 Chronic systolic (congestive) heart failure: Secondary | ICD-10-CM | POA: Insufficient documentation

## 2025-01-25 DIAGNOSIS — Z7901 Long term (current) use of anticoagulants: Secondary | ICD-10-CM | POA: Insufficient documentation

## 2025-01-25 DIAGNOSIS — Z952 Presence of prosthetic heart valve: Secondary | ICD-10-CM | POA: Insufficient documentation

## 2025-01-25 DIAGNOSIS — Z932 Ileostomy status: Secondary | ICD-10-CM | POA: Insufficient documentation

## 2025-01-25 MED ORDER — AMIODARONE HCL 200 MG PO TABS: 200.0000 mg | ORAL_TABLET | Freq: Every day | ORAL | 2 refills | Status: AC

## 2025-01-25 NOTE — Patient Instructions (Signed)
 CHANGE Amiodarone  to 200 mg daily.  Your physician has requested that you have an echocardiogram. Echocardiography is a painless test that uses sound waves to create images of your heart. It provides your doctor with information about the size and shape of your heart and how well your hearts chambers and valves are working. This procedure takes approximately one hour. There are no restrictions for this procedure. Please do NOT wear cologne, perfume, aftershave, or lotions (deodorant is allowed). Please arrive 15 minutes prior to your appointment time.  Please note: We ask at that you not bring children with you during ultrasound (echo/ vascular) testing. Due to room size and safety concerns, children are not allowed in the ultrasound rooms during exams. Our front office staff cannot provide observation of children in our lobby area while testing is being conducted. An adult accompanying a patient to their appointment will only be allowed in the ultrasound room at the discretion of the ultrasound technician under special circumstances. We apologize for any inconvenience.  Your physician recommends that you schedule a follow-up appointment in: 2 months.  If you have any questions or concerns before your next appointment please send us  a message through [place] or call our office at [phone].    TO LEAVE A MESSAGE FOR THE NURSE SELECT OPTION 2, PLEASE LEAVE A MESSAGE INCLUDING: YOUR NAME DATE OF BIRTH CALL BACK NUMBER REASON FOR CALL**this is important as we prioritize the call backs  YOU WILL RECEIVE A CALL BACK THE SAME DAY AS LONG AS YOU CALL BEFORE 4:00 PM  At the [facility], you and your health needs are our priority. As part of our continuing mission to provide you with exceptional heart care, we have created designated Provider Care Teams. These Care Teams include your primary Cardiologist (physician) and Advanced Practice Providers (APPs- Physician Assistants and  Nurse Practitioners) who all work together to provide you with the care you need, when you need it.   You may see any of the following providers on your designated Care Team at your next follow up: Dr Toribio Fuel Dr Ezra Shuck Dr. Morene Brownie Greig Mosses, NP Caffie Shed, [place] [facility], [place] Beckey Coe, NP Jordan Lee, NP Ellouise Class, NP Tinnie Redman, PharmD Jaun Bash, PharmD   Please be sure to bring in all your medications bottles to every appointment.    Thank you for choosing [facility]

## 2025-01-25 NOTE — Progress Notes (Signed)
" ° °  ADVANCED HEART FAILURE FOLLOW UP CLINIC NOTE  Referring Physician: No ref. provider found  Primary Care: Jerrell Cleatus Ned, MD Primary Cardiologist:  HPI: Alejandro Lopez is a 44 y.o. male who presents for follow up of chronic systolic heart failure and multivalvular surgery.      {Anything typed between these two boxes will persist and can be pulled forward to future notes. This phrase will delete itself when the note is signed :1}   @HCHISTORYNARRATIVE @     SUBJECTIVE:  PMH, current medications, allergies, social history, and family history reviewed in epic.  PHYSICAL EXAM: There were no vitals filed for this visit. GENERAL: Well nourished and in no apparent distress at rest.  PULM:  Normal work of breathing, clear to auscultation bilaterally. Respirations are unlabored.  CARDIAC:  JVP: ***         Normal rate with regular rhythm. No murmurs, rubs or gallops.  *** edema. Warm and well perfused extremities. ABDOMEN: Soft, non-tender, non-distended. NEUROLOGIC: Patient is oriented x3 with no focal or lateralizing neurologic deficits.    DATA REVIEW  ECG: ***    ECHO: ***   CATH: ***    ASSESSMENT & PLAN:  ***  Follow up in ***  Morene Brownie, MD [facility] Mechanical Circulatory Support 01/25/25 "

## 2025-01-27 ENCOUNTER — Ambulatory Visit

## 2025-01-27 ENCOUNTER — Encounter: Payer: Worker's Compensation | Admitting: Registered Nurse

## 2025-01-27 ENCOUNTER — Ambulatory Visit: Payer: Self-pay | Admitting: Cardiovascular Disease

## 2025-01-27 DIAGNOSIS — Z953 Presence of xenogenic heart valve: Secondary | ICD-10-CM

## 2025-01-27 DIAGNOSIS — Z952 Presence of prosthetic heart valve: Secondary | ICD-10-CM

## 2025-01-27 LAB — PROTIME-INR
INR: 7.7 (ref 0.9–1.2)
Prothrombin Time: 80.7 s — ABNORMAL HIGH (ref 9.1–12.0)

## 2025-01-27 LAB — POCT INR: INR: 8 — AB (ref 2.0–3.0)

## 2025-01-27 NOTE — Progress Notes (Signed)
 Description   Lab INR 7.7: Spoke with wife Dena.  Hold warfarin for 3 days and then resume at 2.5mg  daily. ER precautions given.   Recheck INR in 1 week. Coumadin  Clinic [phone]

## 2025-01-27 NOTE — Patient Instructions (Addendum)
 Description   Lab INR 7.7: Spoke with wife Dena.  Hold warfarin for 3 days and then resume at 2.5mg  daily. ER precautions given.   Recheck INR in 1 week. Coumadin  Clinic [phone]

## 2025-02-02 ENCOUNTER — Encounter: Admitting: Registered Nurse

## 2025-02-05 ENCOUNTER — Ambulatory Visit

## 2025-02-16 ENCOUNTER — Ambulatory Visit: Admitting: Student in an Organized Health Care Education/Training Program

## 2025-02-24 ENCOUNTER — Ambulatory Visit: Admitting: Surgery

## 2025-03-25 ENCOUNTER — Other Ambulatory Visit (HOSPITAL_COMMUNITY)

## 2025-03-25 ENCOUNTER — Ambulatory Visit (HOSPITAL_COMMUNITY): Admitting: Cardiology
